# Patient Record
Sex: Female | Born: 1940
Health system: Southern US, Community
[De-identification: ages and names within clinical notes are randomized; demographics above are authoritative.]

## PROBLEM LIST (undated history)

## (undated) DIAGNOSIS — E039 Hypothyroidism, unspecified: Secondary | ICD-10-CM

## (undated) DIAGNOSIS — I493 Ventricular premature depolarization: Secondary | ICD-10-CM

## (undated) DIAGNOSIS — N183 Chronic kidney disease, stage 3 unspecified: Secondary | ICD-10-CM

## (undated) DIAGNOSIS — Z9889 Other specified postprocedural states: Secondary | ICD-10-CM

## (undated) DIAGNOSIS — I519 Heart disease, unspecified: Secondary | ICD-10-CM

## (undated) DIAGNOSIS — I480 Paroxysmal atrial fibrillation: Secondary | ICD-10-CM

## (undated) DIAGNOSIS — J309 Allergic rhinitis, unspecified: Secondary | ICD-10-CM

## (undated) DIAGNOSIS — I491 Atrial premature depolarization: Secondary | ICD-10-CM

## (undated) DIAGNOSIS — I679 Cerebrovascular disease, unspecified: Secondary | ICD-10-CM

## (undated) DIAGNOSIS — N3010 Interstitial cystitis (chronic) without hematuria: Secondary | ICD-10-CM

## (undated) DIAGNOSIS — M1990 Unspecified osteoarthritis, unspecified site: Secondary | ICD-10-CM

## (undated) DIAGNOSIS — K589 Irritable bowel syndrome without diarrhea: Secondary | ICD-10-CM

## (undated) DIAGNOSIS — I341 Nonrheumatic mitral (valve) prolapse: Secondary | ICD-10-CM

## (undated) DIAGNOSIS — D649 Anemia, unspecified: Secondary | ICD-10-CM

## (undated) DIAGNOSIS — N059 Unspecified nephritic syndrome with unspecified morphologic changes: Secondary | ICD-10-CM

## (undated) DIAGNOSIS — I340 Nonrheumatic mitral (valve) insufficiency: Secondary | ICD-10-CM

## (undated) DIAGNOSIS — E785 Hyperlipidemia, unspecified: Secondary | ICD-10-CM

## (undated) DIAGNOSIS — Z950 Presence of cardiac pacemaker: Secondary | ICD-10-CM

## (undated) DIAGNOSIS — I10 Essential (primary) hypertension: Secondary | ICD-10-CM

## (undated) DIAGNOSIS — I639 Cerebral infarction, unspecified: Secondary | ICD-10-CM

## (undated) DIAGNOSIS — I441 Atrioventricular block, second degree: Secondary | ICD-10-CM

## (undated) DIAGNOSIS — E213 Hyperparathyroidism, unspecified: Secondary | ICD-10-CM

## (undated) DIAGNOSIS — Z8679 Personal history of other diseases of the circulatory system: Secondary | ICD-10-CM

## (undated) HISTORY — DX: Nonrheumatic mitral (valve) insufficiency: I34.0

## (undated) HISTORY — PX: TONSILLECTOMY: SHX5217

## (undated) HISTORY — DX: Presence of cardiac pacemaker: Z95.0

## (undated) HISTORY — PX: BREAST BIOPSY: SHX20

## (undated) HISTORY — DX: Atrial premature depolarization: I49.1

## (undated) HISTORY — DX: Irritable bowel syndrome, unspecified: K58.9

## (undated) HISTORY — DX: Essential (primary) hypertension: I10

## (undated) HISTORY — DX: Interstitial cystitis (chronic) without hematuria: N30.10

## (undated) HISTORY — DX: Unspecified osteoarthritis, unspecified site: M19.90

## (undated) HISTORY — DX: Hypothyroidism, unspecified: E03.9

## (undated) HISTORY — DX: Unspecified nephritic syndrome with unspecified morphologic changes: N05.9

## (undated) HISTORY — DX: Heart disease, unspecified: I51.9

## (undated) HISTORY — DX: Hyperlipidemia, unspecified: E78.5

## (undated) HISTORY — DX: Paroxysmal atrial fibrillation: I48.0

## (undated) HISTORY — DX: Chronic kidney disease, stage 3 unspecified: N18.30

## (undated) HISTORY — DX: Allergic rhinitis, unspecified: J30.9

## (undated) HISTORY — DX: Cerebrovascular disease, unspecified: I67.9

## (undated) HISTORY — DX: Cerebral infarction, unspecified: I63.9

## (undated) HISTORY — DX: Nonrheumatic mitral (valve) prolapse: I34.1

## (undated) HISTORY — DX: Chronic kidney disease, stage 3 (moderate): N18.3

## (undated) HISTORY — DX: Hyperparathyroidism, unspecified: E21.3

## (undated) HISTORY — PX: CYSTOSTOMY W/ BLADDER BIOPSY: SHX1431

## (undated) HISTORY — DX: Atrioventricular block, second degree: I44.1

## (undated) HISTORY — PX: ABDOMINAL HYSTERECTOMY: SHX81

## (undated) HISTORY — DX: Anemia, unspecified: D64.9

## (undated) HISTORY — DX: Ventricular premature depolarization: I49.3

---

## 1997-12-07 ENCOUNTER — Other Ambulatory Visit: Admission: RE | Admit: 1997-12-07 | Discharge: 1997-12-07 | Payer: Self-pay | Admitting: Nephrology

## 1997-12-10 ENCOUNTER — Other Ambulatory Visit: Admission: RE | Admit: 1997-12-10 | Discharge: 1997-12-10 | Payer: Self-pay | Admitting: Nephrology

## 1998-05-03 ENCOUNTER — Ambulatory Visit (HOSPITAL_COMMUNITY): Admission: RE | Admit: 1998-05-03 | Discharge: 1998-05-03 | Payer: Self-pay | Admitting: Nephrology

## 1998-09-14 ENCOUNTER — Ambulatory Visit (HOSPITAL_COMMUNITY): Admission: RE | Admit: 1998-09-14 | Discharge: 1998-09-14 | Payer: Self-pay | Admitting: Nephrology

## 1998-12-13 ENCOUNTER — Other Ambulatory Visit: Admission: RE | Admit: 1998-12-13 | Discharge: 1998-12-13 | Payer: Self-pay | Admitting: Gynecology

## 1999-01-25 ENCOUNTER — Ambulatory Visit (HOSPITAL_COMMUNITY): Admission: RE | Admit: 1999-01-25 | Discharge: 1999-01-25 | Payer: Self-pay | Admitting: Internal Medicine

## 1999-08-25 ENCOUNTER — Ambulatory Visit (HOSPITAL_COMMUNITY): Admission: RE | Admit: 1999-08-25 | Discharge: 1999-08-25 | Payer: Self-pay | Admitting: Nephrology

## 1999-08-25 ENCOUNTER — Encounter: Payer: Self-pay | Admitting: Nephrology

## 1999-10-30 ENCOUNTER — Encounter: Payer: Self-pay | Admitting: Neurology

## 1999-10-30 ENCOUNTER — Ambulatory Visit (HOSPITAL_COMMUNITY): Admission: RE | Admit: 1999-10-30 | Discharge: 1999-10-30 | Payer: Self-pay | Admitting: Neurology

## 1999-11-29 ENCOUNTER — Encounter: Payer: Self-pay | Admitting: Obstetrics and Gynecology

## 1999-11-29 ENCOUNTER — Encounter: Admission: RE | Admit: 1999-11-29 | Discharge: 1999-11-29 | Payer: Self-pay | Admitting: Obstetrics and Gynecology

## 1999-12-07 ENCOUNTER — Other Ambulatory Visit: Admission: RE | Admit: 1999-12-07 | Discharge: 1999-12-07 | Payer: Self-pay | Admitting: Obstetrics and Gynecology

## 2000-05-27 ENCOUNTER — Encounter: Payer: Self-pay | Admitting: Neurology

## 2000-05-27 ENCOUNTER — Ambulatory Visit (HOSPITAL_COMMUNITY): Admission: RE | Admit: 2000-05-27 | Discharge: 2000-05-27 | Payer: Self-pay | Admitting: Neurology

## 2000-09-02 ENCOUNTER — Encounter: Admission: RE | Admit: 2000-09-02 | Discharge: 2000-09-02 | Payer: Self-pay | Admitting: Obstetrics and Gynecology

## 2000-09-02 ENCOUNTER — Encounter: Payer: Self-pay | Admitting: Obstetrics and Gynecology

## 2000-12-04 ENCOUNTER — Encounter: Admission: RE | Admit: 2000-12-04 | Discharge: 2000-12-04 | Payer: Self-pay | Admitting: Obstetrics and Gynecology

## 2000-12-04 ENCOUNTER — Encounter: Payer: Self-pay | Admitting: Obstetrics and Gynecology

## 2000-12-10 ENCOUNTER — Encounter: Admission: RE | Admit: 2000-12-10 | Discharge: 2000-12-10 | Payer: Self-pay | Admitting: Obstetrics and Gynecology

## 2000-12-10 ENCOUNTER — Encounter: Payer: Self-pay | Admitting: Obstetrics and Gynecology

## 2000-12-12 ENCOUNTER — Other Ambulatory Visit: Admission: RE | Admit: 2000-12-12 | Discharge: 2000-12-12 | Payer: Self-pay | Admitting: Internal Medicine

## 2001-12-22 ENCOUNTER — Encounter: Admission: RE | Admit: 2001-12-22 | Discharge: 2001-12-22 | Payer: Self-pay | Admitting: Obstetrics and Gynecology

## 2001-12-22 ENCOUNTER — Encounter: Payer: Self-pay | Admitting: Obstetrics and Gynecology

## 2001-12-23 ENCOUNTER — Other Ambulatory Visit: Admission: RE | Admit: 2001-12-23 | Discharge: 2001-12-23 | Payer: Self-pay | Admitting: Obstetrics and Gynecology

## 2002-12-25 ENCOUNTER — Encounter: Payer: Self-pay | Admitting: Obstetrics and Gynecology

## 2002-12-25 ENCOUNTER — Encounter: Admission: RE | Admit: 2002-12-25 | Discharge: 2002-12-25 | Payer: Self-pay | Admitting: Obstetrics and Gynecology

## 2003-12-28 ENCOUNTER — Encounter: Admission: RE | Admit: 2003-12-28 | Discharge: 2003-12-28 | Payer: Self-pay | Admitting: Obstetrics and Gynecology

## 2004-07-12 ENCOUNTER — Ambulatory Visit: Payer: Self-pay | Admitting: Internal Medicine

## 2004-08-29 ENCOUNTER — Encounter (INDEPENDENT_AMBULATORY_CARE_PROVIDER_SITE_OTHER): Payer: Self-pay | Admitting: *Deleted

## 2004-08-29 ENCOUNTER — Ambulatory Visit: Payer: Self-pay | Admitting: Internal Medicine

## 2004-08-29 ENCOUNTER — Ambulatory Visit (HOSPITAL_COMMUNITY): Admission: RE | Admit: 2004-08-29 | Discharge: 2004-08-29 | Payer: Self-pay | Admitting: Internal Medicine

## 2005-01-15 ENCOUNTER — Encounter: Admission: RE | Admit: 2005-01-15 | Discharge: 2005-01-15 | Payer: Self-pay | Admitting: Obstetrics and Gynecology

## 2006-02-11 ENCOUNTER — Encounter: Admission: RE | Admit: 2006-02-11 | Discharge: 2006-02-11 | Payer: Self-pay | Admitting: Obstetrics and Gynecology

## 2006-02-20 ENCOUNTER — Encounter: Admission: RE | Admit: 2006-02-20 | Discharge: 2006-02-20 | Payer: Self-pay | Admitting: Obstetrics and Gynecology

## 2006-06-11 ENCOUNTER — Ambulatory Visit: Payer: Self-pay | Admitting: Internal Medicine

## 2007-03-18 ENCOUNTER — Encounter: Admission: RE | Admit: 2007-03-18 | Discharge: 2007-03-18 | Payer: Self-pay | Admitting: Obstetrics and Gynecology

## 2007-03-24 ENCOUNTER — Encounter: Admission: RE | Admit: 2007-03-24 | Discharge: 2007-03-24 | Payer: Self-pay | Admitting: Obstetrics and Gynecology

## 2008-03-24 ENCOUNTER — Encounter: Admission: RE | Admit: 2008-03-24 | Discharge: 2008-03-24 | Payer: Self-pay | Admitting: Obstetrics and Gynecology

## 2009-04-14 ENCOUNTER — Encounter: Admission: RE | Admit: 2009-04-14 | Discharge: 2009-04-14 | Payer: Self-pay | Admitting: Obstetrics and Gynecology

## 2009-08-31 ENCOUNTER — Encounter (INDEPENDENT_AMBULATORY_CARE_PROVIDER_SITE_OTHER): Payer: Self-pay | Admitting: *Deleted

## 2009-10-04 ENCOUNTER — Telehealth: Payer: Self-pay | Admitting: Internal Medicine

## 2009-10-05 DIAGNOSIS — Z862 Personal history of diseases of the blood and blood-forming organs and certain disorders involving the immune mechanism: Secondary | ICD-10-CM | POA: Insufficient documentation

## 2009-10-05 DIAGNOSIS — Z8639 Personal history of other endocrine, nutritional and metabolic disease: Secondary | ICD-10-CM

## 2009-10-05 DIAGNOSIS — R1310 Dysphagia, unspecified: Secondary | ICD-10-CM | POA: Insufficient documentation

## 2009-10-05 DIAGNOSIS — I10 Essential (primary) hypertension: Secondary | ICD-10-CM | POA: Insufficient documentation

## 2009-10-06 ENCOUNTER — Ambulatory Visit: Payer: Self-pay | Admitting: Internal Medicine

## 2009-10-06 DIAGNOSIS — K5289 Other specified noninfective gastroenteritis and colitis: Secondary | ICD-10-CM | POA: Insufficient documentation

## 2009-10-25 ENCOUNTER — Ambulatory Visit: Payer: Self-pay | Admitting: Internal Medicine

## 2009-10-29 ENCOUNTER — Encounter: Payer: Self-pay | Admitting: Internal Medicine

## 2009-11-22 ENCOUNTER — Telehealth: Payer: Self-pay | Admitting: Internal Medicine

## 2010-01-02 ENCOUNTER — Encounter (INDEPENDENT_AMBULATORY_CARE_PROVIDER_SITE_OTHER): Payer: Self-pay | Admitting: *Deleted

## 2010-01-04 ENCOUNTER — Encounter (INDEPENDENT_AMBULATORY_CARE_PROVIDER_SITE_OTHER): Payer: Self-pay | Admitting: *Deleted

## 2010-01-27 ENCOUNTER — Encounter: Admission: RE | Admit: 2010-01-27 | Discharge: 2010-01-27 | Payer: Self-pay | Admitting: Family Medicine

## 2010-01-27 ENCOUNTER — Encounter (INDEPENDENT_AMBULATORY_CARE_PROVIDER_SITE_OTHER): Payer: Self-pay | Admitting: *Deleted

## 2010-02-16 ENCOUNTER — Encounter: Payer: Self-pay | Admitting: Internal Medicine

## 2010-03-27 ENCOUNTER — Ambulatory Visit: Payer: Self-pay | Admitting: Internal Medicine

## 2010-03-27 DIAGNOSIS — N059 Unspecified nephritic syndrome with unspecified morphologic changes: Secondary | ICD-10-CM | POA: Insufficient documentation

## 2010-03-27 LAB — CONVERTED CEMR LAB: IgG (Immunoglobin G), Serum: 1218 mg/dL (ref 694–1618)

## 2010-05-01 ENCOUNTER — Encounter: Admission: RE | Admit: 2010-05-01 | Discharge: 2010-05-01 | Payer: Self-pay | Admitting: Obstetrics and Gynecology

## 2010-06-08 ENCOUNTER — Ambulatory Visit: Payer: Self-pay | Admitting: Internal Medicine

## 2010-06-13 ENCOUNTER — Telehealth: Payer: Self-pay | Admitting: Internal Medicine

## 2010-06-28 ENCOUNTER — Telehealth (INDEPENDENT_AMBULATORY_CARE_PROVIDER_SITE_OTHER): Payer: Self-pay | Admitting: *Deleted

## 2010-06-29 ENCOUNTER — Ambulatory Visit: Payer: Self-pay | Admitting: Internal Medicine

## 2010-06-30 ENCOUNTER — Encounter: Payer: Self-pay | Admitting: Internal Medicine

## 2010-07-11 ENCOUNTER — Telehealth (INDEPENDENT_AMBULATORY_CARE_PROVIDER_SITE_OTHER): Payer: Self-pay | Admitting: *Deleted

## 2010-07-12 ENCOUNTER — Encounter: Payer: Self-pay | Admitting: Internal Medicine

## 2010-07-24 ENCOUNTER — Encounter: Payer: Self-pay | Admitting: Internal Medicine

## 2010-07-24 ENCOUNTER — Ambulatory Visit
Admission: RE | Admit: 2010-07-24 | Discharge: 2010-07-24 | Payer: Self-pay | Source: Home / Self Care | Attending: Internal Medicine | Admitting: Internal Medicine

## 2010-07-24 ENCOUNTER — Other Ambulatory Visit: Payer: Self-pay | Admitting: Internal Medicine

## 2010-07-24 DIAGNOSIS — R197 Diarrhea, unspecified: Secondary | ICD-10-CM | POA: Insufficient documentation

## 2010-07-24 LAB — TSH: TSH: 0.97 u[IU]/mL (ref 0.35–5.50)

## 2010-07-24 LAB — IGA: IgA: 134 mg/dL (ref 68–378)

## 2010-07-24 LAB — SEDIMENTATION RATE: Sed Rate: 17 mm/hr (ref 0–22)

## 2010-07-25 LAB — CONVERTED CEMR LAB: Tissue Transglutaminase Ab, IgA: 18.6 units (ref <20)

## 2010-07-30 ENCOUNTER — Encounter: Payer: Self-pay | Admitting: Obstetrics and Gynecology

## 2010-08-08 NOTE — Assessment & Plan Note (Signed)
Summary: Elevated liver functions   History of Present Illness Visit Type: Follow-up Consult Primary GI MD: Lina Sar MD Primary Provider: [name], MD Requesting Provider: [name], MD Chief Complaint: Patient here to discuss elevated liver function. She has decreased her Prilosec to once a day due to the dry mouth and feeling like she has "fur" on her tounge. She is now having diarrhea which has not last 5-6 weeks, but it is getting better now.  History of Present Illness:   This is a 70 year old white female with resolving diarrhea. She has a history of microscopic colitis in 2000 based on colonoscopic biopsies. Her most recent colonoscopy in April 2011 showed normal colonic mucosa. She developed abnormal liver function test initially attributed to statins. Her upper abdominal ultrasound showed a normal liver and spleen but her alkaline phosphatase and GGT was elevated at 297 and 271 respectively. Her anti-mitochondrial antibody was elevated at 42.3. After discontinuing her statins, her liver function tests returned to normal. Her AMA titer has not been repeated. She has a history of hypothyroidism. She also complains of dry mouth and dry eyes. She has never been tested for Sjogren syndrome.   GI Review of Systems      Denies abdominal pain, acid reflux, belching, bloating, chest pain, dysphagia with liquids, dysphagia with solids, heartburn, loss of appetite, nausea, vomiting, vomiting blood, weight loss, and  weight gain.      Reports diarrhea.     Denies anal fissure, black tarry stools, change in bowel habit, constipation, diverticulosis, fecal incontinence, heme positive stool, hemorrhoids, irritable bowel syndrome, jaundice, light color stool, liver problems, rectal bleeding, and  rectal pain.    Current Medications (verified): 1)  Synthroid 75 Mcg Tabs (Levothyroxine Sodium) .... One Tablet By Mouth Once Daily 2)  Pravachol 20 Mg Tabs (Pravastatin Sodium) .... Take One By  Mouth Once Daily 3)  Calcitriol 0.25 Mcg Caps (Calcitriol) .... One Tablet By Mouth Once Daily 4)  Flonase 50 Mcg/act Susp (Fluticasone Propionate) .... Once Daily 5)  Acidophilus Xtra  Tabs (Probiotic Product) .... One Tablet By Mouth Two Times A Day 6)  Amiloride-Hydrochlorothiazide 5-50 Mg Tabs (Amiloride-Hydrochlorothiazide) .... One Tablet By Mouth Once Daily 7)  Claritin 10 Mg Tabs (Loratadine) .... One Tablet By Mouth Once Daily 8)  Aspirin 325 Mg  Tabs (Aspirin) .... One Tablet By Mouth Once Daily 9)  Multivitamins   Tabs (Multiple Vitamin) .... One Tablet By Mouth Once Daily 10)  Fish Oil 1000 Mg Caps (Omega-3 Fatty Acids) .... 2 Tablets By Mouth Once Daily 11)  Glucosamine 500 Mg Caps (Glucosamine Sulfate) .... 2 Capsules By Mouth Once Daily 12)  Lutein 6 Mg Caps (Lutein) .... One Tablet By Mouth Once Daily 13)  Metrogel 1 % Gel (Metronidazole) .... Externally Once Daily 14)  Westcort 0.2 % Oint (Hydrocortisone Valerate) .... 2 Tablets By Mouth Once Daily As Needed 15)  Prilosec 20 Mg Cpdr (Omeprazole) .... Take 1 Tablet By Mouth Once Daily 16)  Amoxicillin 500 Mg Caps (Amoxicillin) .... Take Two By Mouth Two Times A Day  Allergies (verified): 1)  ! Tetracycline 2)  ! Sulfa 3)  ! Keflex 4)  ! Erythromycin 5)  ! Cipro  Past History:  Past Medical History: Reviewed history from 10/06/2009 and no changes required. Current Problems:  HYPOKALEMIA, HX OF (ICD-V12.2) HYPERTENSION (ICD-401.9) DYSPHAGIA UNSPECIFIED (ICD-787.20)  Hyperlipidemia Hypothyroidism Interstitial Cystitis Irritable Bowel Syndrome  Past Surgical History: Reviewed history from 10/05/2009 and no changes required. Hysterectomy Breast  biopsy  Family History: Reviewed history from 10/05/2009 and no changes required. Family History of Ovarian Cancer:Sister No FH of Colon Cancer: Family History of Heart Disease: Mother, Brother, Father  Social History: Reviewed history from 10/06/2009 and no changes  required. Mother Retired Alcohol Use - yes-occasional Illicit Drug Use - no Patient is a former smoker.   Review of Systems       The patient complains of allergy/sinus and fatigue.  The patient denies anemia, anxiety-new, arthritis/joint pain, back pain, blood in urine, breast changes/lumps, change in vision, confusion, cough, coughing up blood, depression-new, fainting, fever, headaches-new, hearing problems, heart murmur, heart rhythm changes, itching, menstrual pain, muscle pains/cramps, night sweats, nosebleeds, pregnancy symptoms, shortness of breath, skin rash, sleeping problems, sore throat, swelling of feet/legs, swollen lymph glands, thirst - excessive , urination - excessive , urination changes/pain, urine leakage, vision changes, and voice change.         Pertinent positive and negative review of systems were noted in the above HPI. All other ROS was otherwise negative.   Vital Signs:  Patient profile:   70 year old female Height:      67.5 inches Weight:      143.8 pounds BMI:     22.27 Pulse rate:   82 / minute Pulse rhythm:   regular BP sitting:   144 / 62  (left arm) Cuff size:   regular  Vitals Entered By: Harlow Mares CMA Duncan Dull) (March 27, 2010 1:38 PM)  Physical Exam  General:  Well developed, well nourished, no acute distress. Eyes:  nonicteric. Mouth:  normal oral mucosa. Neck:  Supple; no masses or thyromegaly. Lungs:  Clear throughout to auscultation. Heart:  Regular rate and rhythm; no murmurs, rubs,  or bruits. Abdomen:  Soft, nontender and nondistended. No masses, hepatosplenomegaly or hernias noted. Normal bowel sounds. Extremities:  No clubbing, cyanosis, edema or deformities noted. Skin:  Intact without significant lesions or rashes. Psych:  Alert and cooperative. Normal mood and affect.   Impression & Recommendations:  Problem # 1:  LIVER FUNCTION TESTS, ABNORMAL, HX OF (ICD-V12.2) Patient has an elevated antimitochondrial antibody  suggestive of primary biliary cirrhosis syndrome. We will repeat her IgM levels and AMA titer again. Her ultrasound did not show any evidence of hepatosplenomegaly or portal hypertension. Orders: T-AMA ([id number]) T-Anti SMA ([id number]) TLB-IgG (Immunoglobulin G) (82784-IGG) TLB-IgM (Immunoglobulin M) (82784-IGM)  Problem # 2:  COLITIS (ICD-558.9) Patient has a history of microscopic colitis in 2000, not reproduced on biopsies from 2011. Orders: T-AMA [id number]) T-Anti SMA ([id number]) TLB-IgG (Immunoglobulin G) (82784-IGG) TLB-IgM (Immunoglobulin M) (82784-IGM)  Problem # 3:  DYSPHAGIA UNSPECIFIED (ICD-787.20) Patient is status post upper endoscopy in April 2011 with dilatation. Her symptoms are controlled on Prilosec.  Problem # 4:  GLOMERULONEPHRITIS (ICD-583.9) followed by Dr [name].  Patient Instructions: 1)  anti-mitochondrial antibody, IgM and IgG levels. 2)  If abnormal, consider percutaneous liver biopsy. 3)  Rule out kerratoconjunstivitis and Xerostomia. You should check with your ophthalmologist (Schirmenr's test). 4)  Copy sent to : Dr Hart Rochester, Dr Deterding 5)  The medication list was reviewed and reconciled.  All changed / newly prescribed medications were explained.  A complete medication list was provided to the patient / caregiver.

## 2010-08-08 NOTE — Progress Notes (Signed)
Summary: triage-Dysphagia  Phone Note Call from Patient Call back at Home Phone [phone]   Caller: Patient Call For: Juanda Chance Reason for Call: Talk to Nurse Summary of Call: Patient would like to schedule an appt because she feels food getting stuck in her chest especially if she eats meat.Patient states that she has pain when she eats. Initial call taken by: Tawni Levy,  October 04, 2009 2:56 PM  Follow-up for Phone Call        Pt. c/o episodes of dysphagia, getting worse and more frequent.   1) Begin Prilosec OTC two times a day 2) Soft,bland diet. Be very careful with meats,rice and breads. 3) See Dr.Morgon Pamer on 10-06-09 at 5pm 4) If symptoms become worse call back immediately or go to ER.  Follow-up by: Laureen Ochs LPN,  October 04, 2009 3:23 PM

## 2010-08-08 NOTE — Progress Notes (Signed)
Summary: How long should she stay on Prilosec?  Phone Note Call from Patient Call back at Home Phone [phone]   Call For: DR Waynette Towers Reason for Call: Talk to Nurse Summary of Call: Had Endo 10-25-09 is doing just fine. Unsure how long is she to stay on Prilosec for? Initial call taken by: Leanor Kail [facility],  Nov 22, 2009 10:35 AM  Follow-up for Phone Call        Patient has been advised to stay on prilosec indefinately and to follow antireflux measures. Patient verbalizes understanding. Follow-up by: Lamona Curl CMA Duncan Dull),  Nov 22, 2009 1:40 PM

## 2010-08-08 NOTE — Procedures (Signed)
Summary: COLON   Colonoscopy  Procedure date:  08/29/2004  Findings:      Location:  [facility].    Procedures Next Due Date:    Colonoscopy: 09/2009 Patient Name: Amanda [name], Amanda [name] a. MRN:  Procedure Procedures: Colonoscopy CPT: [phone].    with biopsy. CPT: [id number].  Personnel: Endoscopist: Aaralyn Kil L. Juanda Chance, MD.  Exam Location: Exam performed in Outpatient Clinic. Outpatient  Patient Consent: Procedure, Alternatives, Risks and Benefits discussed, consent obtained, from patient. Consent was obtained by the RN.  Indications  Surveillance of: hx of microscopic colitis. 2000.  History  Current Medications: Patient is not currently taking Coumadin.  Pre-Exam Physical: Performed Aug 29, 2004. Entire physical exam was normal.  Exam Exam: Extent of exam reached: Cecum, extent intended: Cecum.  The cecum was identified by appendiceal orifice and IC valve. Colon retroflexion performed. Images taken. ASA Classification: I. Tolerance: good.  Monitoring: Pulse and BP monitoring, Oximetry used. Supplemental O2 given.  Colon Prep Used Miralax for colon prep. Prep results: good.  Sedation Meds: Patient assessed and found to be appropriate for moderate (conscious) sedation. Fentanyl 75 mcg. given IV. Versed 7 mg. given IV.  Findings DIAGNOSTIC TEST: Biopsies taken. from Ascending Colon. Reason: hx of colitis.   Assessment Normal examination.  Comments: s/p random biopsies Events  Unplanned Interventions: No intervention was required.  Unplanned Events: There were no complications. Plans Medication Plan: Await pathology.  Patient Education: Patient given standard instructions for: Yearly hemoccult testing recommended. Patient instructed to get routine colonoscopy every 5-7 years.  Comments: final disposition pending results of random biopsies Disposition: After procedure patient sent to recovery. After recovery patient sent home.   This report was  created from the original endoscopy report, which was reviewed and signed by the above listed endoscopist.    FINAL DIAGNOSIS    ***MICROSCOPIC EXAMINATION AND DIAGNOSIS***    COLON, BIOPSIES: BENIGN COLONIC MUCOSA WITH UNDERLYING LYMPHOID   AGGREGATES.   SEE COMMENT.    COMMENT   The clinical history of microscopic colitis is noted ([id number]).   The current biopsy specimen demonstrates fragments of benign   colonic mucosa with underlying lymphoid aggregates. No active   colitis is seen. No evidence of microscopic colitis is noted.   No malignancy is identified. (MS:jy) 08/30/04    jy   Date Reported: 08/30/2004 Renato Battles, M.D.   *** Electronically Signed Out By MS ***    Clinical information   History of microscopic colitis; now asymptomatic (cm)    specimen(s) obtained   Colon, biopsy, random    Gross Description   Received in formalin are tan, soft tissue fragments that are   submitted in toto. Number: multiple   Size: 0.5 x 0.4 x 0.2cm, one block. (SW:gt) 08/29/04    gdt/

## 2010-08-08 NOTE — Procedures (Signed)
Summary: Upper Endoscopy  Patient: Amanda [name] Note: All result statuses are Final unless otherwise noted.  Tests: (1) Upper Endoscopy (EGD)   EGD Upper Endoscopy       DONE     [facility]     [address]     [place], [place]  [address]           ENDOSCOPY PROCEDURE REPORT           PATIENT:  Amanda [name], Amanda [name]  MR#:  [id number]     BIRTHDATE:  [birth year], 68 yrs. old  GENDER:  female           ENDOSCOPIST:  Hedwig Morton. Juanda Chance, MD     Referred by:  [name], M.D.           PROCEDURE DATE:  10/25/2009     PROCEDURE:  EGD with dilatation over guidewire     ASA CLASS:  Class I     INDICATIONS:  dysphagia, cough dysphagia to solids, food     regurgitation           MEDICATIONS:   Versed 1 mg, Fentanyl 25 mcg, There was residual     sedation effect present from prior procedure.     TOPICAL ANESTHETIC:  Cetacaine Spray           DESCRIPTION OF PROCEDURE:   After the risks benefits and     alternatives of the procedure were thoroughly explained, informed     consent was obtained.  The  endoscope was introduced through the     mouth and advanced to the second portion of the duodenum, without     limitations.  The instrument was slowly withdrawn as the mucosa     was fully examined.     <<PROCEDUREIMAGES>>           A stricture was found in the distal esophagus (see image1, image5,     and image6). benign appearing fibrous stricture at 36 cm, about 14     mm, no acute esophGITIS Savary dilation over a guidewire 1,15,16MM     DILATORS PASSED WITHOUT DIFFICULTI  other findings (see image4,     image3, and image2).  A hiatal hernia was found. 1-2 cm sliding     hiatal hernia    Retroflexed views revealed no abnormalities.     The scope was then withdrawn from the patient and the procedure     completed.           COMPLICATIONS:  None           ENDOSCOPIC IMPRESSION:     1) Stricture in the distal esophagus     2) Other findings     3) Hiatal hernia     s/p  Savary dil to 16 mm     RECOMMENDATIONS:     1) Anti-reflux regimen to be follow     Prelosec 20 mg po qd           REPEAT EXAM:  In 0 year(s) for.  redilate prn           ______________________________     Hedwig Morton. Juanda Chance, MD           CC:           n.     eSIGNED:   Hedwig Morton. Demontae Antunes at 10/25/2009 02:56 PM           Carrie Mew, [id number]  Note: An exclamation mark (!) indicates  a result that was not dispersed into the flowsheet. Document Creation Date: 10/25/2009 2:57 PM _______________________________________________________________________  (1) Order result status: Final Collection or observation date-time: 10/25/2009 14:46 Requested date-time:  Receipt date-time:  Reported date-time:  Referring Physician:   Ordering Physician: Lina Sar [phone]) Specimen Source:  Source: Launa Grill Order Number: [id number] Lab site:

## 2010-08-08 NOTE — Assessment & Plan Note (Signed)
Summary: WORSENING DYSPHAGIA              DEBORAH   History of Present Illness Visit Type: Follow-up Visit Primary GI MD: Lina Sar MD Primary Provider: [name], MD Chief Complaint: Solid food dysphagia with coughing, increasing acid reflux.Pt states she eats certain meats that gets stuck in her esophagus and she clears her throat a lot. History of Present Illness:   This is a 70 year old white female with a several month history of solid food dysphagia. She also complains of occasional cough with dry throat and mucous in the back of her throat. She has noticed difficulty with some meats but no problems with liquids. We have followed her in the past for microscopic colitis which was found on a colonoscopy completed in 2000. A repeat colonoscopy in February 2006 showed no colitis. She is due for a repeat colonoscopy this year. She was on antacids and calcium supplementation but discontinued those one year ago. She has been followed by nephrology for glomerulonephritis.   GI Review of Systems    Reports abdominal pain, acid reflux, dysphagia with solids, and  heartburn.     Location of  Abdominal pain: epigastric area.    Denies belching, bloating, chest pain, dysphagia with liquids, loss of appetite, nausea, vomiting, vomiting blood, weight loss, and  weight gain.        Denies anal fissure, black tarry stools, change in bowel habit, constipation, diarrhea, diverticulosis, fecal incontinence, heme positive stool, hemorrhoids, irritable bowel syndrome, jaundice, light color stool, liver problems, rectal bleeding, and  rectal pain. Preventive Screening-Counseling & Management  Alcohol-Tobacco     Smoking Status: quit    Current Medications (verified): 1)  Synthroid 75 Mcg Tabs (Levothyroxine Sodium) .... One Tablet By Mouth Once Daily 2)  Lipitor 10 Mg Tabs (Atorvastatin Calcium) .... One Tablet By Mouth Once Daily 3)  Calcitriol 0.25 Mcg Caps (Calcitriol) .... One Tablet By Mouth  Once Daily 4)  Flonase 50 Mcg/act Susp (Fluticasone Propionate) .... Once Daily 5)  Acidophilus Xtra  Tabs (Probiotic Product) .... One Tablet By Mouth Two Times A Day 6)  Amiloride-Hydrochlorothiazide 5-50 Mg Tabs (Amiloride-Hydrochlorothiazide) .... One Tablet By Mouth Once Daily 7)  Claritin 10 Mg Tabs (Loratadine) .... One Tablet By Mouth Once Daily 8)  Aspirin 325 Mg  Tabs (Aspirin) .... One Tablet By Mouth Once Daily 9)  Multivitamins   Tabs (Multiple Vitamin) .... One Tablet By Mouth Once Daily 10)  Fish Oil 1000 Mg Caps (Omega-3 Fatty Acids) .... 2 Tablets By Mouth Once Daily 11)  Glucosamine 500 Mg Caps (Glucosamine Sulfate) .... 2 Capsules By Mouth Once Daily 12)  Lutein 6 Mg Caps (Lutein) .... One Tablet By Mouth Once Daily 13)  Metrogel 1 % Gel (Metronidazole) .... Externally Once Daily 14)  Westcort 0.2 % Oint (Hydrocortisone Valerate) .... 2 Tablets By Mouth Once Daily As Needed  Allergies: 1)  ! Tetracycline 2)  ! Sulfa 3)  ! Keflex 4)  ! Erythromycin 5)  ! Cipro  Past History:  Past Medical History: Current Problems:  HYPOKALEMIA, HX OF (ICD-V12.2) HYPERTENSION (ICD-401.9) DYSPHAGIA UNSPECIFIED (ICD-787.20)  Hyperlipidemia Hypothyroidism Interstitial Cystitis Irritable Bowel Syndrome  Past Surgical History: Reviewed history from 10/05/2009 and no changes required. Hysterectomy Breast biopsy  Family History: Reviewed history from 10/05/2009 and no changes required. Family History of Ovarian Cancer:Sister No FH of Colon Cancer: Family History of Heart Disease: Mother, Brother, Father  Social History: Reviewed history from 10/05/2009 and no changes required. Mother Retired  Alcohol Use - yes-occasional Illicit Drug Use - no Patient is a former smoker.  Smoking Status:  quit  Review of Systems       The patient complains of allergy/sinus, blood in urine, cough, fatigue, itching, and sore throat.  The patient denies anemia, anxiety-new, arthritis/joint  pain, back pain, breast changes/lumps, change in vision, confusion, coughing up blood, depression-new, fainting, fever, headaches-new, hearing problems, heart murmur, heart rhythm changes, menstrual pain, muscle pains/cramps, night sweats, nosebleeds, pregnancy symptoms, shortness of breath, skin rash, sleeping problems, swelling of feet/legs, swollen lymph glands, thirst - excessive , urination - excessive , urination changes/pain, urine leakage, vision changes, and voice change.         Pertinent positive and negative review of systems were noted in the above HPI. All other ROS was otherwise negative.   Vital Signs:  Patient profile:   70 year old female Height:      67.5 inches Weight:      145 pounds BMI:     22.46 Pulse rate:   80 / minute Pulse rhythm:   regular BP sitting:   148 / 72  (right arm) Cuff size:   regular  Vitals Entered By: Christie Nottingham CMA Duncan Dull) (October 06, 2009 4:25 PM)   Impression & Recommendations:  Problem # 1:  DYSPHAGIA UNSPECIFIED (ICD-787.20)  Patient has progressive dysphagia to solids and liquids, sore throat and scratching which may be related to  allergies o postnasal drip. Solid food dysphagia is suggestive of an esophageal stricture. She has a history of intermittent acid reflux but has never been evaluated for it. We will proceed with an upper endoscopy at this time. She will continue on Prilosec 20 mg twice a day.  Orders: Colon/Endo (Colon/Endo)  Problem # 2:  COLITIS (ICD-558.9)  Patient has a history of microscopic colitis seen on a colonoscopy in 2000 while she was having diarrhea. She had a normal colonoscopy with random biopsies in 2006. She is due for a repeat colonoscopy at this time. She denies any diarrhea. Her diet includes a probiotic one twice a day.  Orders: Colon/Endo (Colon/Endo)  Patient Instructions: 1)  Prilosec 20 mg p.o. b.i.d. 2)  Antireflux measures. 3)  Upper endoscopy with possible dilatation. 4)  Colonoscopy. 5)   Copy sent to :Dr Hall Busing  Prescriptions: PRILOSEC 20 MG CPDR (OMEPRAZOLE) Take 1 tablet by mouth two times a day  #60 x 3   Entered by:   Hortense Ramal CMA (AAMA)   Authorized by:   Hart Carwin MD   Signed by:   Hortense Ramal CMA (AAMA) on 10/06/2009   Method used:   Electronically to        [facility] (retail)       [address]       [place], [place]  [id number]       Ph: [phone] or [phone]       Fax: [phone]   RxID:   [id number] DULCOLAX 5 MG  TBEC (BISACODYL) Day before procedure take 2 at 3pm and 2 at 8pm.  #4 x 0   Entered by:   Hortense Ramal CMA (AAMA)   Authorized by:   Hart Carwin MD   Signed by:   Hortense Ramal CMA (AAMA) on 10/06/2009   Method used:   Electronically to        [facility] (retail)       [address]       [place], [place]  [id number]       Ph: [phone] or [phone]       Fax: [phone]   RxID:   [id number] REGLAN 10 MG  TABS (METOCLOPRAMIDE HCL) As per prep instructions.  #2 x 0   Entered by:   Hortense Ramal CMA (AAMA)   Authorized by:   Hart Carwin MD   Signed by:   Hortense Ramal CMA (AAMA) on 10/06/2009   Method used:   Electronically to        [facility] (retail)       [address]       [place], [place]  [id number]       Ph: [phone] or [phone]       Fax: [phone]   RxID:   [id number] MIRALAX   POWD (POLYETHYLENE GLYCOL 3350) As per prep  instructions.  #255gm x 0   Entered by:   Hortense Ramal CMA (AAMA)   Authorized by:   Hart Carwin MD   Signed by:   Hortense Ramal CMA (AAMA) on 10/06/2009   Method used:   Electronically to        [facility] (retail)       [address]       [place], [place]  [id number]       Ph: [phone] or [phone]       Fax: [phone]   RxID:   [id number]

## 2010-08-08 NOTE — Letter (Signed)
Summary: Patient Notice- Colon Biospy Results  [facility]  [address] [place], [place] [address]   Phone: [phone]  Fax: [phone]        October 29, 2009 MRN: [id number]    [facility] [address] [place], [place]  [address]    Dear Ms. Gahan,  I am pleased to inform you that the biopsies taken during your recent  colonoscopy did not show any evidence of colitis. Additional information/recommendations:  _x_No further action is needed at this time.  Please follow-up with      your primary care physician for your other healthcare needs.  __Please call [phone] to schedule a return visit to review      your condition.  __Continue with the treatment plan as outlined on the day of your      exam.  _x_You should have a repeat colonoscopy examination for this problem           in  10 _ years.  Please call us if you are having persistent problems or have questions about your condition that have not been fully answered at this time.  Sincerely,  Hart Carwin MD   This letter has been electronically signed by your physician.  Appended Document: Patient Notice- Colon Biospy Results letter mailed 4.25.11

## 2010-08-08 NOTE — Letter (Signed)
Summary: [facility] Instructions  [facility]  [address] [place], [place] [address]   Phone: [phone]  Fax: [phone]       AMERI CAHOON    04-Nov-1940    MRN: [id number]       Procedure Day /Date: 10/25/09 Tuesday     Arrival Time: 1:00 pm     Procedure Time: 2:00 pm     Location of Procedure:                    _x _  [facility] (4th Floor)  PREPARATION FOR COLONOSCOPY WITH MIRALAX  Starting 5 days prior to your procedure (10/20/09) do not eat nuts, seeds, popcorn, corn, beans, peas,  salads, or any raw vegetables.  Do not take any fiber supplements (e.g. Metamucil, Citrucel, and Benefiber). ____________________________________________________________________________________________________   THE DAY BEFORE YOUR PROCEDURE         DATE: 10/24/09 DAY: Monday  1   Drink clear liquids the entire day-NO SOLID FOOD  2   Do not drink anything colored red or purple.  Avoid juices with pulp.  No orange juice.  3   Drink at least 64 oz. (8 glasses) of fluid/clear liquids during the day to prevent dehydration and help the prep work efficiently.  CLEAR LIQUIDS INCLUDE: Water Jello Ice Popsicles Tea (sugar ok, no milk/cream) Powdered fruit flavored drinks Coffee (sugar ok, no milk/cream) Gatorade Juice: apple, white grape, white cranberry  Lemonade Clear bullion, consomm, broth Carbonated beverages (any kind) Strained chicken noodle soup Hard Candy  4   Mix the entire bottle of Miralax with 64 oz. of Gatorade/Powerade in the morning and put in the refrigerator to chill.  5   At 3:00 pm take 2 Dulcolax/Bisacodyl tablets.  6   At 4:30 pm take one Reglan/Metoclopramide tablet.  7  Starting at 5:00 pm drink one 8 oz glass of the Miralax mixture every 15-20 minutes until you have finished drinking the entire 64 oz.  You should finish drinking prep around 7:30 or 8:00 pm.  8   If you are nauseated, you may take the 2nd Reglan/Metoclopramide  tablet at 6:30 pm.        9    At 8:00 pm take 2 more DULCOLAX/Bisacodyl tablets.         THE DAY OF YOUR PROCEDURE      DATE:  10/25/09 DAY: Tuesday  You may drink clear liquids until 12:00 pm (2 HOURS BEFORE PROCEDURE).   MEDICATION INSTRUCTIONS  Unless otherwise instructed, you should take regular prescription medications with a small sip of water as early as possible the morning of your procedure.         OTHER INSTRUCTIONS  You will need a responsible adult at least 72 years of age to accompany you and drive you home.   This person must remain in the waiting room during your procedure.  Wear loose fitting clothing that is easily removed.  Leave jewelry and other valuables at home.  However, you may wish to bring a book to read or an iPod/MP3 player to listen to music as you wait for your procedure to start.  Remove all body piercing jewelry and leave at home.  Total time from sign-in until discharge is approximately 2-3 hours.  You should go home directly after your procedure and rest.  You can resume normal activities the day after your procedure.  The day of your procedure you should not:   Drive  Make legal decisions   Operate machinery   Drink alcohol   Return to work  You will receive specific instructions about eating, activities and medications before you leave.   The above instructions have been reviewed and explained to me by  Hortense Ramal CMA Duncan Dull)  October 06, 2009 5:19 PM     I fully understand and can verbalize these instructions _____________________________ Date 10/06/09

## 2010-08-08 NOTE — Letter (Signed)
Summary: [employer]  [employer]   Imported By: Lamona Curl CMA (AAMA) 03/22/2010 15:56:13  _____________________________________________________________________  External Attachment:    Type:   Image     Comment:   External Document

## 2010-08-08 NOTE — Procedures (Signed)
Summary: Colonoscopy  Patient: Amanda [name] Note: All result statuses are Final unless otherwise noted.  Tests: (1) Colonoscopy (COL)   COL Colonoscopy           DONE     [facility]     [address]     [place], [place]  [address]           COLONOSCOPY PROCEDURE REPORT           PATIENT:  Amanda, [name]  MR#:  [id number]     BIRTHDATE:  1941-02-02, 68 yrs. old  GENDER:  female     ENDOSCOPIST:  Hedwig Morton. Juanda Chance, MD     REF. BY:  [name], M.D.     PROCEDURE DATE:  10/25/2009     PROCEDURE:  Colonoscopy 78[address]     ASA CLASS:  Class I     INDICATIONS:  hx of microscopic colitis 2000     prior colonoscopy 08/2004, no colitis     MEDICATIONS:   Versed 9 mg, Fentanyl 75 mcg           DESCRIPTION OF PROCEDURE:   After the risks benefits and     alternatives of the procedure were thoroughly explained, informed     consent was obtained.  Digital rectal exam was performed and     revealed no rectal masses.   The LB [id number] endoscope     was introduced through the anus and advanced to the cecum, which     was identified by both the appendix and ileocecal valve, without     limitations.  The quality of the prep was good, using MiraLax.     The instrument was then slowly withdrawn as the colon was fully     examined.     <<PROCEDUREIMAGES>>           FINDINGS:  No polyps or cancers were seen. random biopsdies to r/o     microscopic colitis With standard forceps, biopsy was obtained and     sent to pathology (see image1, image2, and image3).   Retroflexed     views in the rectum revealed no abnormalities.    The scope was     then withdrawn from the patient and the procedure completed.           COMPLICATIONS:  None     ENDOSCOPIC IMPRESSION:     1) No polyps or cancers     2) Normal colonoscopy     RECOMMENDATIONS:     1) Await biopsy results     2) High fiber diet.     REPEAT EXAM:  In 10 year(s) for.           ______________________________  Hedwig Morton. Juanda Chance, MD           CC:           n.     eSIGNED:   Hedwig Morton. Kurstin Dimarzo at 10/25/2009 03:00 PM           Carrie Mew, [id number]  Note: An exclamation mark (!) indicates a result that was not dispersed into the flowsheet. Document Creation Date: 10/25/2009 3:01 PM _______________________________________________________________________  (1) Order result status: Final Collection or observation date-time: 10/25/2009 14:34 Requested date-time:  Receipt date-time:  Reported date-time:  Referring Physician:   Ordering Physician: Lina Sar [phone]) Specimen Source:  Source: Launa Grill Order Number: [id number] Lab site:   Appended Document: Colonoscopy     Procedures Next Due Date:  Colonoscopy: 11/2019

## 2010-08-08 NOTE — Letter (Signed)
Summary: Amanda [name] Office Visit  [facility] Office Visit   Imported By: Lamona Curl CMA (AAMA) 03/22/2010 15:54:39  _____________________________________________________________________  External Attachment:    Type:   Image     Comment:   External Document

## 2010-08-08 NOTE — Progress Notes (Signed)
Summary: Triage  Phone Note Call from Patient Call back at Home Phone [phone]   Caller: Patient Call For: Dr. Juanda Chance Reason for Call: Talk to Nurse Summary of Call: diarrhea, gas pains...taking Imodium and has questions Initial call taken by: Karna Christmas,  June 13, 2010 3:29 PM  Follow-up for Phone Call        Patient states that when she came to MD visit in September she thought the diarrhea was getting better but it started up again. Patient states she gets up in AM and has diarrhea stool with some formed balls of stool that is yellowish-brown in color. She states the stool smells bad. Denies urgency with diarrhea.She reports stomach cramps prior to the diarrhea. When she has the bowel movement, she also has explosive gas. She then takes one Imodium and does not have diarrhea for the rest of the day. This goes on every day. Patient states she has not called to report this because she knew she was to return in March for follow up labs and she thought it would go away but it has not. Please, advise. Follow-up by: Jesse Fall RN,  June 13, 2010 3:57 PM  Additional Follow-up for Phone Call Additional follow up Details #1::        Please try C ipro 250 mg by mouth two times a day x 5 days. And Bentyl 20mg , # 40 1 by mouth two times a day. Call us  with an update in 2 weeks. Additional Follow-up by: Hart Carwin MD,  June 14, 2010 8:32 AM    Additional Follow-up for Phone Call Additional follow up Details #2::    Patient is allergic to Tetracycline, Sulfa, Keflex, Erythromycin, Cipro. She has taken Amoxycillin without problems.Jesse Fall RN  June 14, 2010 8:46 AM Patient notified of Dr. Regino Schultze recommendations. Rx's sent to patient's pharmacy. She will call with an update in 2 weeks or earlier if problems worsen. Follow-up by: Jesse Fall RN,  June 14, 2010 10:01 AM  Additional Follow-up for Phone Call Additional follow up Details #3:: Details for  Additional Follow-up Action Taken: OK Amoxacillin 5oomg by mouth two times a day x 1 week, # 14, Additional Follow-up by: Hart Carwin MD,  June 14, 2010 9:31 AM  New/Updated Medications: BENTYL 20 MG TABS (DICYCLOMINE HCL) Take one by mouth BID AMOXICILLIN 500 MG CAPS (AMOXICILLIN) Take one by mouth two times a day x 1 week Prescriptions: AMOXICILLIN 500 MG CAPS (AMOXICILLIN) Take one by mouth two times a day x 1 week  #14 x 0   Entered by:   Jesse Fall RN   Authorized by:   Hart Carwin MD   Signed by:   Jesse Fall RN on 06/14/2010   Method used:   Electronically to        [facility] (retail)       [address]       [place], [place]  [id number]       Ph: [phone] or [phone]       Fax: [phone]   RxID:   [id number] BENTYL 20 MG TABS (DICYCLOMINE HCL) Take one by mouth BID  #40 x 0   Entered by:   Jesse Fall RN   Authorized by:   Hart Carwin MD   Signed by:   Jesse Fall RN on 06/14/2010   Method used:   Electronically to        [facility] (retail)       [address]       [place], [place]  [address]       Ph: [phone] or [phone]       Fax: [phone]   RxID:   [id number]

## 2010-08-08 NOTE — Letter (Signed)
Summary: Colonoscopy Letter  [facility]  [address] [place], [place] [address]   Phone: [phone]  Fax: [phone]      August 31, 2009 MRN: [id number]   [facility] [address] [place], [place]  [address]   Dear Amanda [name],   According to your medical record, it is time for you to schedule a Colonoscopy. The American Cancer Society recommends this procedure as a method to detect early colon cancer. Patients with a family history of colon cancer, or a personal history of colon polyps or inflammatory bowel disease are at increased risk.  This letter has beeen generated based on the recommendations made at the time of your procedure. If you feel that in your particular situation this may no longer apply, please contact our office.  Please call our office at [phone] to schedule this appointment or to update your records at your earliest convenience.  Thank you for cooperating with [place] to provide you with the very best care possible.   Sincerely,  Hedwig Morton. Juanda Chance, M.D  [facility] [phone]

## 2010-08-10 NOTE — Assessment & Plan Note (Signed)
Summary: f/u diarrhea--ch.   History of Present Illness Visit Type: Follow-up Visit Primary GI MD: Lina Sar MD Primary Provider: [name], MD Requesting Provider: na Chief Complaint: F/u for diarrhea. Pt states that she never started her Metronidazole because the diarrhea started clearing up but diarrhea is back now History of Present Illness:   This is a 70 year old white female with chronic intermittent diarrhea. She has a history of microscopic/ collagenous colitis in 2000 which was seen on  biopsies. Subsequent exams in 2006 and 2011 did not show any evidence of colitis. Since August 2011, she has had watery stools alternating with normal bowel movements. She has a history of irritable bowel syndrome. She denies rectal bleeding or pain. Her weight has been somewhat decreased from 135 to 130 pounds. Stool specimens showed positive lactoferrin. Her stool culture was negative. A sprue profile in 2000 was negative. She is on a thyroid supplement. Her last TSH in August 2011 was normal.   GI Review of Systems      Denies abdominal pain, acid reflux, belching, bloating, chest pain, dysphagia with liquids, dysphagia with solids, heartburn, loss of appetite, nausea, vomiting, vomiting blood, weight loss, and  weight gain.      Reports diarrhea.     Denies anal fissure, black tarry stools, change in bowel habit, constipation, diverticulosis, fecal incontinence, heme positive stool, hemorrhoids, irritable bowel syndrome, jaundice, light color stool, liver problems, rectal bleeding, and  rectal pain.    Current Medications (verified): 1)  Synthroid 75 Mcg Tabs (Levothyroxine Sodium) .... One Tablet By Mouth Once Daily 2)  Pravachol 20 Mg Tabs (Pravastatin Sodium) .... Take One By Mouth Once Daily 3)  Calcitriol 0.25 Mcg Caps (Calcitriol) .... One Tablet By Mouth Once Daily 4)  Flonase 50 Mcg/act Susp (Fluticasone Propionate) .... Once Daily 5)  Acidophilus Xtra  Tabs (Probiotic Product)  .... One Tablet By Mouth Two Times A Day 6)  Amiloride-Hydrochlorothiazide 5-50 Mg Tabs (Amiloride-Hydrochlorothiazide) .... One Tablet By Mouth Once Daily 7)  Claritin 10 Mg Tabs (Loratadine) .... One Tablet By Mouth Once Daily 8)  Aspirin 325 Mg  Tabs (Aspirin) .... One Tablet By Mouth Once Daily 9)  Multivitamins   Tabs (Multiple Vitamin) .... One Tablet By Mouth Once Daily.. On Hold 10)  Fish Oil 1000 Mg Caps (Omega-3 Fatty Acids) .... 2 Tablets By Mouth Once Daily--On Hold 11)  Glucosamine 500 Mg Caps (Glucosamine Sulfate) .... 2 Capsules By Mouth Once Daily 12)  Lutein 6 Mg Caps (Lutein) .... One Tablet By Mouth Once Daily 13)  Metrogel 1 % Gel (Metronidazole) .... Externally Once Daily 14)  Westcort 0.2 % Oint (Hydrocortisone Valerate) .... 2 Tablets By Mouth Once Daily As Needed  Allergies (verified): 1)  ! Tetracycline 2)  ! Sulfa 3)  ! Keflex 4)  ! Erythromycin 5)  ! Cipro  Past History:  Past Medical History: HYPOKALEMIA, HX OF (ICD-V12.2) HYPERTENSION (ICD-401.9) DYSPHAGIA UNSPECIFIED (ICD-787.20)  Hyperlipidemia Hypothyroidism Interstitial Cystitis Irritable Bowel Syndrome  Past Surgical History: Reviewed history from 10/05/2009 and no changes required. Hysterectomy Breast biopsy  Family History: Reviewed history from 10/05/2009 and no changes required. Family History of Ovarian Cancer:Sister No FH of Colon Cancer: Family History of Heart Disease: Mother, Brother, Father  Social History: Retired Alcohol Use - yes-occasional Illicit Drug Use - no Patient is a former smoker.   Review of Systems  The patient denies allergy/sinus, anemia, anxiety-new, arthritis/joint pain, back pain, blood in urine, breast changes/lumps, change in vision, confusion, cough,  coughing up blood, depression-new, fainting, fatigue, fever, headaches-new, hearing problems, heart murmur, heart rhythm changes, itching, menstrual pain, muscle pains/cramps, night sweats, nosebleeds,  pregnancy symptoms, shortness of breath, skin rash, sleeping problems, sore throat, swelling of feet/legs, swollen lymph glands, thirst - excessive , urination - excessive , urination changes/pain, urine leakage, vision changes, and voice change.         Pertinent positive and negative review of systems were noted in the above HPI. All other ROS was otherwise negative.   Vital Signs:  Patient profile:   70 year old female Height:      67.5 inches Weight:      140 pounds BMI:     21.68 BSA:     1.75 Pulse rate:   96 / minute Pulse rhythm:   regular BP sitting:   132 / 80  (left arm) Cuff size:   regular  Vitals Entered By: Ok Anis CMA (July 24, 2010 11:39 AM)  Physical Exam  General:  Well developed, well nourished, no acute distress. Eyes:  PERRLA, no icterus. Mouth:  No deformity or lesions, dentition normal. Neck:  Supple; no masses or thyromegaly. Lungs:  Clear throughout to auscultation. Heart:  rapid S1-S2, no murmur. Abdomen:  soft nontender abdomen with hyperactive bowel sounds and increased tympany. Liver edge at costal margin. No palpable mass. Extremities:  No clubbing, cyanosis, edema or deformities noted. Skin:  Intact without significant lesions or rashes. Psych:  Alert and cooperative. Normal mood and affect.   Impression & Recommendations:  Problem # 1:  DIARRHEA (ICD-787.91) Paitent has ongoing diarrhea, likely irritable bowel syndrome. We need to rule out recurrent collagenous colitis, sprue or bacterial overgrowth. We also need to rule out hyperthyroidism. We will proceed with an empirical trial of prednisone 20 mg a day for 3 days, 15 mg a day for 3 days, 10 mg a day for 3 days, 5 mg a day for 3 days then discontinue. If not helpful, we may need to consider Flagyl 250 mg 3 times a day. We will recheck her sprue profile today as well as a TSH, sedimentation rate and IgA level. Orders: TLB-TSH (Thyroid Stimulating Hormone) (84443-TSH) TLB-Sedimentation  Rate (ESR) (85652-ESR) TLB-IgA (Immunoglobulin A) (82784-IGA) T-Sprue Panel (Celiac Disease Aby Eval) (83516x3/[address]) T-Tissue Transglutamase Ab IgA (30160-10932) T- * Misc. Laboratory test [phone])  Problem # 2:  GLOMERULONEPHRITIS (ICD-583.9) Patient is followed by Dr Deterding.  Patient Instructions: 1)  Your physician requests that you go to the basement floor of our office to have the following labwork completed before leaving today: TSH, Sprue Panel, Sedimentation Rate, IgA level. 2)  Please pick up your prescriptions at the pharmacy. Electronic prescription(s) has already been sent for Prednisone 10 mg. You should take it as follows:    3)   Take 2 tablets by mouth x 3 days. 4)   Take 1 1/2 tablets by mouth x 3 days. 5)   Take 1 tablet by mouth x 3 days. 6)   Take 1/2 tablet by mouth x 3 days. 7)   Then, DISCONTINUE 8)  Copy sent to : [name], MD 9)  The medication list was reviewed and reconciled.  All changed / newly prescribed medications were explained.  A complete medication list was provided to the patient / caregiver. Prescriptions: PREDNISONE 10 MG TABS (PREDNISONE) Take as directed  #20 x 0   Entered by:   Lamona Curl CMA (AAMA)   Authorized by:   Hart Carwin MD   Signed  by:   Lamona Curl CMA (AAMA) on 07/24/2010   Method used:   Electronically to        [facility] (retail)       [address]       [place], [place]  [address]       Ph: [phone] or [phone]       Fax: [phone]   RxID:   [id number]

## 2010-08-10 NOTE — Progress Notes (Signed)
  Phone Note Outgoing Call Call back at [facility] Phone [phone]   Call placed by: Jesse Fall, RN Call placed to: Patient Summary of Call: Message left for patient to call back with update on how she is doing. Jesse Fall RN  July 11, 2010 11:21 AM' Patient returned our call. She states she did not start the Flagyl because she had a "respiratory virus." She states she is some better now. She only has diarrhea 2-3 times in AM and is not having any cramps.  Initial call taken by: Jesse Fall RN,  July 11, 2010 11:54 AM  Follow-up for Phone Call        Then it is OK to hold the Flagyl unless she has a recurrent diarrhea                   Follow-up by: Hart Carwin MD,  July 11, 2010 12:47 PM  Additional Follow-up for Phone Call Additional follow up Details #1::        Patient notified of Dr. Regino Schultze recommedation. She wants to keep the 07/24/10 appointment for now. Additional Follow-up by: Jesse Fall RN,  July 11, 2010 1:42 PM

## 2010-08-10 NOTE — Progress Notes (Signed)
  Phone Note Outgoing Call Call back at [facility] Phone [phone]   Call placed by: Jesse Fall, RN Call placed to: Patient Summary of Call: Called patient to see how she was doing after taking Amoxycillin for diarrhea. Patient states she finished the antibiotic on Saturday 06/24/10 and the diarrhea was better. Diarrhea started again yesterday. She states she had 5 diarrhea stools yesterday. She is having diarrhea today also. Denies bleeding. Cramps occur prior to diarrhea episode.  She also has developed bronchitis. Patient has allergy to: Tetracycline, Sulfa, Keflex, Erythromycin and Cipro. Initial call taken by: Jesse Fall RN,  June 28, 2010 9:35 AM  Follow-up for Phone Call        Please try Flagyl 250 mg by mouth three times a day x 7 days, #21, 1 refill. Please obtain stool Lactoferrin and C&S. If no better call in 1 week. I will consider short course of Prednisone ( she had microspcopic colitis in 2000). Follow-up by: Hart Carwin MD,  June 28, 2010 10:41 PM  Additional Follow-up for Phone Call Additional follow up Details #1::        Message left for patient to call back.Jesse Fall, RN 06/29/10 Jesse Fall RN  June 29, 2010 9:08 AM  Spoke with patient. She will come today and pick up specimen cups. Rx sent to patient's pharmacy. Patient to call back in one week with update. Additional Follow-up by: Jesse Fall RN,  June 29, 2010 10:55 AM    New/Updated Medications: METRONIDAZOLE 250 MG TABS (METRONIDAZOLE) take one tablet three times a day for one week Prescriptions: METRONIDAZOLE 250 MG TABS (METRONIDAZOLE) take one tablet three times a day for one week  #21 x 1   Entered by:   Jesse Fall RN   Authorized by:   Hart Carwin MD   Signed by:   Jesse Fall RN on 06/29/2010   Method used:   Electronically to        [facility] (retail)       [address]       [place], [place]  [id number]       Ph: [phone] or [phone]  Fax: [phone]   RxID:   [id number]

## 2010-09-27 ENCOUNTER — Other Ambulatory Visit: Payer: Self-pay

## 2010-11-24 NOTE — Assessment & Plan Note (Signed)
[facility]                         GASTROENTEROLOGY OFFICE NOTE   NAME:Amanda [name], Amanda [name]                   MRN:          [id number]  DATE:06/11/2006                            DOB:          04-30-1941    Amanda [name] is a 70 year old, white female who had an acute diarrhea  starting September of this year.  It started after she took a course of  antibiotics, amoxicillin 3 times a day for 5 days for sinus problems and  an upper respiratory infection.  The diarrhea started several weeks  after she finished the antibiotic.  She had a similar episode of  diarrhea several years ago.  At that time, she was quite stressed out  and we thought it was due to irritable bowel syndrome.  She had 2  colonoscopies in the past.  The last one in February 2006 did not show  any evidence of microscopic or collagenous colitis.  The diarrhea  subsided spontaneously.  This time, the diarrhea also subsided  spontaneously.  Her stool cultures by Dr. [name] were negative for C.  difficile.  She takes probiotics on a daily basis.  There was no blood  per rectum and no abdominal pain.  There is no family history of  inflammatory bowel disease but her husband is an outpatient for his  Crohn's disease.  She now is back to normal bowel habits, 1-2 bowel  movements a day.   MEDICATIONS:  1. Fexofenadine 60 mg p.o. daily.  2. Lipitor 10 mg p.o. daily.  3. Amilor/hydrochlorothiazide 50 one p.o. daily.  4. Calcitriol.  5. Synthroid 0.75 mcg daily.  6. Vivelle patch.  7. Glucosamine.  8. Fish oil.  9. Vitamins.  10.Aspirin.  11.Acidophilus.  12.Lutein 6 mg daily.   PHYSICAL EXAM:  Blood pressure 140/66.  Pulse 60 and weight 143 pounds.  She appears healthy today.  No distress.  LUNGS:  Clear to auscultation.  COR:  Normal S1 and normal S2.  Sclerae nonicteric.  NECK:  Supple without adenopathy.  ABDOMEN:  Soft with normoactive bowel sounds.  No distention.  No  tenderness.  Liver edge at costal margin.  RECTAL:  With small external hemorrhoidal tags.  Rectal tone was normal.  Stool was brown.  Hemoccult  negative.  EXTREMITIES:  No edema.   IMPRESSION:  A 70 year old white female with acute diarrheal illness  which, since then, has subsided spontaneously.  I am not sure of the  etiology of it but I assume this was an infectious cause either due to  bacterial overgrowth related to previous antibiotics or due to actual  Clostridium difficile colitis, although her Clostridium difficile toxin  was negative.  Since she has improved spontaneously, I believe this was  a minor infection.  I also feel that there is a possibility that her  sinus and upper respiratory infection was caused by a normal virus and  caused a subsequent gastroenteritis.  By taking probiotics, she might  have improved her bacterial flora and recovered spontaneously without  use of Flagyl or any other medication.  She is currently well.  There is  no evidence of  GI blood loss.  She is up to date on her colonoscopy.   PLAN:  1. Continue high-fiber diet.  2. Continue probiotics.  3. Next colonoscopy in 10 years, that will be in February 2016.  4. I will be happy to see her in the future.  I told her to make sure      that we see her as an acute work-in if she gets acute diarrhea.     Hedwig Morton. Juanda Chance, MD  Electronically Signed    DMB/MedQ  DD: 06/11/2006  DT: 06/11/2006  Job #: [id number]   cc:   Fayrene Fearing L. Deterding, M.D.  Pam Drown, M.D.

## 2011-02-16 ENCOUNTER — Other Ambulatory Visit: Payer: Self-pay | Admitting: Family Medicine

## 2011-02-16 ENCOUNTER — Ambulatory Visit
Admission: RE | Admit: 2011-02-16 | Discharge: 2011-02-16 | Disposition: A | Discharge status: Home / Self Care | Payer: Medicare Other | Source: Ambulatory Visit | Attending: Family Medicine | Admitting: Family Medicine

## 2011-04-01 ENCOUNTER — Emergency Department (HOSPITAL_COMMUNITY): Payer: Medicare Other

## 2011-04-01 ENCOUNTER — Emergency Department (HOSPITAL_COMMUNITY)
Admission: EM | Admit: 2011-04-01 | Discharge: 2011-04-01 | Disposition: A | Discharge status: Home / Self Care | Payer: Medicare Other | Attending: Emergency Medicine | Admitting: Emergency Medicine

## 2011-04-01 DIAGNOSIS — E039 Hypothyroidism, unspecified: Secondary | ICD-10-CM | POA: Insufficient documentation

## 2011-04-01 DIAGNOSIS — I440 Atrioventricular block, first degree: Secondary | ICD-10-CM | POA: Insufficient documentation

## 2011-04-01 DIAGNOSIS — R0789 Other chest pain: Secondary | ICD-10-CM | POA: Insufficient documentation

## 2011-04-01 DIAGNOSIS — R61 Generalized hyperhidrosis: Secondary | ICD-10-CM | POA: Insufficient documentation

## 2011-04-01 DIAGNOSIS — I10 Essential (primary) hypertension: Secondary | ICD-10-CM | POA: Insufficient documentation

## 2011-04-01 DIAGNOSIS — E785 Hyperlipidemia, unspecified: Secondary | ICD-10-CM | POA: Insufficient documentation

## 2011-04-01 DIAGNOSIS — R110 Nausea: Secondary | ICD-10-CM | POA: Insufficient documentation

## 2011-04-01 DIAGNOSIS — N059 Unspecified nephritic syndrome with unspecified morphologic changes: Secondary | ICD-10-CM | POA: Insufficient documentation

## 2011-04-01 DIAGNOSIS — R0602 Shortness of breath: Secondary | ICD-10-CM | POA: Insufficient documentation

## 2011-04-01 LAB — TROPONIN I: Troponin I: <0.3 ng/mL (ref <0.30)

## 2011-04-01 LAB — POCT I-STAT, CHEM 8
BUN: 31 mg/dL — ABNORMAL HIGH (ref 6–23)
Calcium, Ion: 1.28 mmol/L (ref 1.12–1.32)
Chloride: 104 meq/L (ref 96–112)
Creatinine, Ser: 1.4 mg/dL — ABNORMAL HIGH (ref 0.50–1.10)
Glucose, Bld: 113 mg/dL — ABNORMAL HIGH (ref 70–99)
HCT: 41 % (ref 36.0–46.0)
Hemoglobin: 13.9 g/dL (ref 12.0–15.0)
Potassium: 3.5 meq/L (ref 3.5–5.1)
Sodium: 141 meq/L (ref 135–145)
TCO2: 27 mmol/L (ref 0–100)

## 2011-04-18 ENCOUNTER — Other Ambulatory Visit: Payer: Self-pay | Admitting: Obstetrics and Gynecology

## 2011-04-18 DIAGNOSIS — Z1231 Encounter for screening mammogram for malignant neoplasm of breast: Secondary | ICD-10-CM

## 2011-04-26 ENCOUNTER — Other Ambulatory Visit: Payer: Self-pay | Admitting: Obstetrics and Gynecology

## 2011-04-26 DIAGNOSIS — N951 Menopausal and female climacteric states: Secondary | ICD-10-CM

## 2011-05-18 ENCOUNTER — Ambulatory Visit
Admission: RE | Admit: 2011-05-18 | Discharge: 2011-05-18 | Disposition: A | Discharge status: Home / Self Care | Payer: Medicare Other | Source: Ambulatory Visit | Attending: Obstetrics and Gynecology | Admitting: Obstetrics and Gynecology

## 2011-05-18 ENCOUNTER — Ambulatory Visit: Payer: Medicare Other

## 2011-05-18 DIAGNOSIS — Z1231 Encounter for screening mammogram for malignant neoplasm of breast: Secondary | ICD-10-CM

## 2011-05-18 DIAGNOSIS — N951 Menopausal and female climacteric states: Secondary | ICD-10-CM

## 2011-10-12 ENCOUNTER — Encounter: Payer: Self-pay | Admitting: Internal Medicine

## 2012-04-29 ENCOUNTER — Other Ambulatory Visit: Payer: Self-pay | Admitting: Obstetrics and Gynecology

## 2012-04-29 DIAGNOSIS — Z1231 Encounter for screening mammogram for malignant neoplasm of breast: Secondary | ICD-10-CM

## 2012-06-10 ENCOUNTER — Ambulatory Visit
Admission: RE | Admit: 2012-06-10 | Discharge: 2012-06-10 | Disposition: A | Discharge status: Home / Self Care | Payer: Self-pay | Source: Ambulatory Visit | Attending: Obstetrics and Gynecology | Admitting: Obstetrics and Gynecology

## 2012-06-10 DIAGNOSIS — Z1231 Encounter for screening mammogram for malignant neoplasm of breast: Secondary | ICD-10-CM

## 2012-10-02 ENCOUNTER — Other Ambulatory Visit: Payer: Self-pay | Admitting: Dermatology

## 2013-05-21 ENCOUNTER — Other Ambulatory Visit: Payer: Self-pay

## 2013-05-21 DIAGNOSIS — Z1231 Encounter for screening mammogram for malignant neoplasm of breast: Secondary | ICD-10-CM

## 2013-06-24 ENCOUNTER — Ambulatory Visit: Payer: Self-pay

## 2013-06-29 ENCOUNTER — Telehealth: Payer: Self-pay | Admitting: Neurology

## 2013-06-29 ENCOUNTER — Ambulatory Visit
Admission: RE | Admit: 2013-06-29 | Discharge: 2013-06-29 | Disposition: A | Discharge status: Home / Self Care | Payer: Medicare Other | Source: Ambulatory Visit

## 2013-06-29 DIAGNOSIS — Z1231 Encounter for screening mammogram for malignant neoplasm of breast: Secondary | ICD-10-CM

## 2013-06-29 NOTE — Telephone Encounter (Signed)
Patient would like a copy of her medical records. Please call the patient.

## 2013-07-22 ENCOUNTER — Ambulatory Visit: Payer: Self-pay | Admitting: Obstetrics and Gynecology

## 2013-11-11 ENCOUNTER — Encounter: Payer: Self-pay | Admitting: General Surgery

## 2013-11-11 DIAGNOSIS — I341 Nonrheumatic mitral (valve) prolapse: Secondary | ICD-10-CM | POA: Insufficient documentation

## 2013-11-20 ENCOUNTER — Encounter: Payer: Self-pay | Admitting: Internal Medicine

## 2013-11-25 ENCOUNTER — Encounter: Payer: Self-pay | Admitting: *Deleted

## 2013-11-26 ENCOUNTER — Encounter: Payer: Self-pay | Admitting: Cardiology

## 2013-11-26 ENCOUNTER — Ambulatory Visit (INDEPENDENT_AMBULATORY_CARE_PROVIDER_SITE_OTHER): Payer: Medicare Other | Admitting: Cardiology

## 2013-11-26 VITALS — BP 114/64 | HR 79 | Ht 67.5 in | Wt 142.0 lb

## 2013-11-26 DIAGNOSIS — I4510 Unspecified right bundle-branch block: Secondary | ICD-10-CM

## 2013-11-26 DIAGNOSIS — I340 Nonrheumatic mitral (valve) insufficiency: Secondary | ICD-10-CM

## 2013-11-26 DIAGNOSIS — I491 Atrial premature depolarization: Secondary | ICD-10-CM | POA: Insufficient documentation

## 2013-11-26 DIAGNOSIS — I10 Essential (primary) hypertension: Secondary | ICD-10-CM

## 2013-11-26 DIAGNOSIS — I059 Rheumatic mitral valve disease, unspecified: Secondary | ICD-10-CM

## 2013-11-26 DIAGNOSIS — I341 Nonrheumatic mitral (valve) prolapse: Secondary | ICD-10-CM

## 2013-11-26 LAB — BASIC METABOLIC PANEL
BUN: 31 mg/dL — ABNORMAL HIGH (ref 6–23)
CALCIUM: 10.1 mg/dL (ref 8.4–10.5)
CO2: 30 meq/L (ref 19–32)
CREATININE: 1.6 mg/dL — AB (ref 0.4–1.2)
Chloride: 100 mEq/L (ref 96–112)
GFR: 34.42 mL/min — ABNORMAL LOW (ref >60.00)
GLUCOSE: 97 mg/dL (ref 70–99)
Potassium: 3.5 mEq/L (ref 3.5–5.1)
Sodium: 138 mEq/L (ref 135–145)

## 2013-11-26 NOTE — Progress Notes (Signed)
[address] [place], [place]  [address] Phone: [phone] Fax:  [phone]  Date:  11/26/2013   ID:  [name], DOB [birth year], MRN [id number]  PCP:  [name], MD  Cardiologist:  Armanda Magic, MD     History of Present Illness: Amanda [name] is a 73 y.o. female with a history of PAC's, MVP with mild MR and HTN who presents today for followup.  She is doing well.  She denies any chest pain, SOB, DOE, LE edema, palpitations or syncope.  She did have a dizzy episode after working out in the yard the the extreme heat.   Wt Readings from Last 3 Encounters:  11/26/13 142 lb (64.411 kg)  07/24/10 140 lb (63.504 kg)  03/27/10 143 lb 12.8 oz (65.227 kg)     Past Medical History  Diagnosis Date  . Allergic rhinitis   . Hyperparathyroidism   . Seasonal allergies   . DJD (degenerative joint disease)   . MVP (mitral valve prolapse)     moderate posterior MVP with moderate MR and grade II diasotlic dysfunction  . Hypothyroidism   . Hyperlipidemia   . Anemia   . Small vessel disease, cerebrovascular   . Chronic kidney disease     stage III  . IBS (irritable bowel syndrome)   . Glomerulonephritis     Dr [name]  . PAC (premature atrial contraction)   . Hypokalemia   . Interstitial cystitis   . Hypertension     Current Outpatient Prescriptions  Medication Sig Dispense Refill  . AMILORIDE-HYDROCHLOROTHIAZIDE PO Take 5-50 mg by mouth daily.      [name] aspirin 325 MG tablet Take 325 mg by mouth daily.      . calcitRIOL (ROCALTROL) 0.25 MCG capsule Take 0.25 mcg by mouth every other day.      . fluticasone (FLONASE) 50 MCG/ACT nasal spray Place 2 sprays into both nostrils as needed for allergies or rhinitis.      . hydrocortisone valerate ointment (WEST-CORT) 0.2 % Apply 1 application topically 2 (two) times daily.      [name] levothyroxine (SYNTHROID, LEVOTHROID) 75 MCG tablet Take 75 mcg by mouth daily before breakfast.      . loratadine (CLARITIN) 10 MG  tablet Take 10 mg by mouth daily as needed for allergies.      . metroNIDAZOLE (METROGEL) 1 % gel Apply 1 application topically daily.      . Probiotic Product (PROBIOTIC DAILY PO) Take 1 tablet by mouth daily.       No current facility-administered medications for this visit.    Allergies:    Allergies  Allergen Reactions  . Cephalexin Rash  . Ciprofloxacin Rash  . Erythromycin Rash  . Sulfonamide Derivatives Rash  . Tetracycline Rash    Social History:  The patient  reports that she has quit smoking. She does not have any smokeless tobacco history on file. She reports that she does not drink alcohol or use illicit drugs.   Family History:  The patient's family history includes Heart disease in her brother, father, and mother; Ovarian cancer in her sister. There is no history of Colon cancer.   ROS:  Please see the history of present illness.      All other systems reviewed and negative.   PHYSICAL EXAM: VS:  BP 114/64  Pulse 79  Ht 5' 7.5" (1.715 m)  Wt 142 lb (64.411 kg)  BMI 21.90 kg/m2 Well nourished, well developed, in no acute distress  HEENT: normal Neck: no JVD Cardiac:  normal S1, S2; RRR;2/6 late systolic murmur at LLSB to apex Lungs:  clear to auscultation bilaterally, no wheezing, rhonchi or rales Abd: soft, nontender, no hepatomegaly Ext: no edema Skin: warm and dry Neuro:  CNs 2-12 intact, no focal abnormalities noted  EKG:  NSR with 1st degree AV block and RBBB     ASSESSMENT AND PLAN:  1. Moderate posterior MVP with moderate MR - echo is scheduled for next month to reevaluate 2. HTN well controlled - continue diuretic - check BMET 3. PAC's - asymptomatic 4. New RBBB - Lexiscan myoveiw to rule out ischemia  Followup with me in 1 year  Signed, Armanda Magicraci Turner, MD 11/26/2013 9:12 AM

## 2013-11-26 NOTE — Patient Instructions (Signed)
Your physician recommends that you continue on your current medications as directed. Please refer to the Current Medication list given to you today.  Your physician recommends that you go to the lab today for a BMET  Your physician has requested that you have a lexiscan myoview. For further information please visit [email or web address] Please follow instruction sheet, as given. ( Pt would like to have same day as her already scheduled ECHO if possible)  Your physician wants you to follow-up in: 1 year with Dr Sherlyn Lick will receive a reminder letter in the mail two months in advance. If you don't receive a letter, please call our office to schedule the follow-up appointment.

## 2013-11-27 ENCOUNTER — Other Ambulatory Visit: Payer: Self-pay | Admitting: *Deleted

## 2013-11-27 DIAGNOSIS — I10 Essential (primary) hypertension: Secondary | ICD-10-CM

## 2013-11-27 MED ORDER — AMLODIPINE BESYLATE 2.5 MG PO TABS: 2.5000 mg | ORAL_TABLET | Freq: Every day | ORAL | Status: DC

## 2013-12-01 ENCOUNTER — Other Ambulatory Visit (INDEPENDENT_AMBULATORY_CARE_PROVIDER_SITE_OTHER): Payer: Medicare Other

## 2013-12-01 DIAGNOSIS — I10 Essential (primary) hypertension: Secondary | ICD-10-CM

## 2013-12-01 LAB — BASIC METABOLIC PANEL
BUN: 24 mg/dL — AB (ref 6–23)
CHLORIDE: 103 meq/L (ref 96–112)
CO2: 30 mEq/L (ref 19–32)
CREATININE: 1.5 mg/dL — AB (ref 0.4–1.2)
Calcium: 9.6 mg/dL (ref 8.4–10.5)
GFR: 37.45 mL/min — AB (ref >60.00)
GLUCOSE: 101 mg/dL — AB (ref 70–99)
Potassium: 3.2 mEq/L — ABNORMAL LOW (ref 3.5–5.1)
Sodium: 140 mEq/L (ref 135–145)

## 2013-12-03 ENCOUNTER — Other Ambulatory Visit: Payer: Self-pay

## 2013-12-03 DIAGNOSIS — E876 Hypokalemia: Secondary | ICD-10-CM

## 2013-12-03 MED ORDER — POTASSIUM CHLORIDE CRYS ER 20 MEQ PO TBCR: EXTENDED_RELEASE_TABLET | ORAL | Status: DC

## 2013-12-04 ENCOUNTER — Other Ambulatory Visit (INDEPENDENT_AMBULATORY_CARE_PROVIDER_SITE_OTHER): Payer: Medicare Other

## 2013-12-04 DIAGNOSIS — E876 Hypokalemia: Secondary | ICD-10-CM

## 2013-12-04 LAB — BASIC METABOLIC PANEL
BUN: 21 mg/dL (ref 6–23)
CALCIUM: 9.5 mg/dL (ref 8.4–10.5)
CO2: 29 mEq/L (ref 19–32)
CREATININE: 1.3 mg/dL — AB (ref 0.4–1.2)
Chloride: 107 mEq/L (ref 96–112)
GFR: 42.48 mL/min — AB (ref >60.00)
GLUCOSE: 92 mg/dL (ref 70–99)
Potassium: 3.5 mEq/L (ref 3.5–5.1)
SODIUM: 142 meq/L (ref 135–145)

## 2013-12-07 ENCOUNTER — Telehealth: Payer: Self-pay | Admitting: Cardiology

## 2013-12-07 NOTE — Telephone Encounter (Signed)
Spoke with pt and she doesn't want to start a new medication until [name] has personally talked with Dr. [name]. Also, she took the Amiloride/hctz for the last two days and I told pt to stay off this medication because of decreased renal function. She agrees to stay off amiloride/hctz, but states she will take the risk of not being on a BP medication until she sees Dr. [name] next Monday, unless [name] speaks Dr. [name] before then.

## 2013-12-07 NOTE — Telephone Encounter (Signed)
Message copied by Theda Sers on Mon Dec 07, 2013  1:43 PM ------      Message from: Armanda Magic R      Created: Mon Dec 07, 2013  9:37 AM       Stop Amlodipine and start Toprol XL 12.5mg  daily.  Check BP daily for a week and call with results.  Renal function improved off Amiloride/HCTZ ------

## 2013-12-08 ENCOUNTER — Encounter: Payer: Self-pay | Admitting: Cardiology

## 2013-12-08 NOTE — Telephone Encounter (Signed)
This encounter was created in error - please disregard.

## 2013-12-08 NOTE — Telephone Encounter (Signed)
Routed to [employer], CMA

## 2013-12-08 NOTE — Telephone Encounter (Signed)
OK to hold on starting Toprol until she is seen by Dr. [name] next monday

## 2013-12-09 NOTE — Telephone Encounter (Signed)
Pt notified. Pt will call back next week with update.

## 2013-12-14 ENCOUNTER — Other Ambulatory Visit (HOSPITAL_COMMUNITY): Payer: Medicare Other

## 2013-12-16 ENCOUNTER — Other Ambulatory Visit: Payer: Self-pay

## 2013-12-16 ENCOUNTER — Other Ambulatory Visit (HOSPITAL_COMMUNITY): Payer: Self-pay | Admitting: Radiology

## 2013-12-16 ENCOUNTER — Ambulatory Visit (HOSPITAL_BASED_OUTPATIENT_CLINIC_OR_DEPARTMENT_OTHER): Payer: Medicare Other | Admitting: Radiology

## 2013-12-16 ENCOUNTER — Ambulatory Visit (HOSPITAL_COMMUNITY): Payer: Medicare Other | Attending: Cardiology | Admitting: Radiology

## 2013-12-16 VITALS — BP 138/64 | Ht 67.5 in | Wt 142.0 lb

## 2013-12-16 DIAGNOSIS — Z8249 Family history of ischemic heart disease and other diseases of the circulatory system: Secondary | ICD-10-CM | POA: Insufficient documentation

## 2013-12-16 DIAGNOSIS — I10 Essential (primary) hypertension: Secondary | ICD-10-CM | POA: Insufficient documentation

## 2013-12-16 DIAGNOSIS — R0609 Other forms of dyspnea: Secondary | ICD-10-CM | POA: Insufficient documentation

## 2013-12-16 DIAGNOSIS — I059 Rheumatic mitral valve disease, unspecified: Secondary | ICD-10-CM

## 2013-12-16 DIAGNOSIS — R42 Dizziness and giddiness: Secondary | ICD-10-CM | POA: Insufficient documentation

## 2013-12-16 DIAGNOSIS — R0989 Other specified symptoms and signs involving the circulatory and respiratory systems: Secondary | ICD-10-CM | POA: Insufficient documentation

## 2013-12-16 DIAGNOSIS — I4510 Unspecified right bundle-branch block: Secondary | ICD-10-CM | POA: Insufficient documentation

## 2013-12-16 DIAGNOSIS — Z87891 Personal history of nicotine dependence: Secondary | ICD-10-CM | POA: Insufficient documentation

## 2013-12-16 DIAGNOSIS — R9431 Abnormal electrocardiogram [ECG] [EKG]: Secondary | ICD-10-CM | POA: Insufficient documentation

## 2013-12-16 DIAGNOSIS — R002 Palpitations: Secondary | ICD-10-CM | POA: Insufficient documentation

## 2013-12-16 MED ORDER — TECHNETIUM TC 99M SESTAMIBI GENERIC - CARDIOLITE
11.0000 | Freq: Once | INTRAVENOUS | Status: AC | PRN
Start: 2013-12-16 — End: 2013-12-16
  Administered 2013-12-16: 11 via INTRAVENOUS

## 2013-12-16 MED ORDER — TECHNETIUM TC 99M SESTAMIBI GENERIC - CARDIOLITE
33.0000 | Freq: Once | INTRAVENOUS | Status: AC | PRN
  Administered 2013-12-16: 33 via INTRAVENOUS

## 2013-12-16 MED ORDER — REGADENOSON 0.4 MG/5ML IV SOLN
0.4000 mg | Freq: Once | INTRAVENOUS | Status: AC
  Administered 2013-12-16: 0.4 mg via INTRAVENOUS

## 2013-12-16 NOTE — Progress Notes (Signed)
Echocardiogram performed.  

## 2013-12-16 NOTE — Progress Notes (Signed)
[facility] SITE 3 NUCLEAR MED [address] [place], [place] [address] [phone]    Cardiology Nuclear Med Study  Amanda [name] is a 73 y.o. female     MRN : [id number]     DOB: 03/01/1941  Procedure Date: 12/16/2013  Nuclear Med Background Indication for Stress Test:  Evaluation for Ischemia and Abnormal EKG (new) RBBB History:  No H/O CAD Cath ok per pt 9/12 MPI: EF; 81% 6/14 ECHO:  NL LVF  Cardiac Risk Factors: Strong Family History - CAD, History of Smoking, Hypertension, Lipids and RBBB  Symptoms:  Dizziness, DOE and Palpitations   Nuclear Pre-Procedure Caffeine/Decaff Intake:  None> 12 hrs NPO After: 5:15am   Lungs:  clear O2 Sat: 97% on room air. IV 0.9% NS with Angio Cath:  22g  IV Site: R Wrist x 1, tolerated well IV Started by:  Amanda Hong, RN  Chest Size (in):  38 Cup Size: B  Height: 5' 7.5" (1.715 m)  Weight:  142 lb (64.411 kg)  BMI:  Body mass index is 21.9 kg/(m^2). Tech Comments:  No medications today.    Nuclear Med Study 1 or 2 day study: 1 day  Stress Test Type:  Lexiscan  Reading MD: N/A  Order Authorizing Provider:  Armanda Magic, MD  Resting Radionuclide: Technetium 73m Sestamibi  Resting Radionuclide Dose: 11.0 mCi   Stress Radionuclide:  Technetium 16m Sestamibi  Stress Radionuclide Dose: 33.0 mCi           Stress Protocol Rest HR: 72 Stress HR: 100  Rest BP: 138/64 Stress BP: 151/55  Exercise Time (min): n/a METS: n/a   Predicted Max HR: 146 bpm % Max HR: 68.49 bpm Rate Pressure Product: 85277   Dose of Adenosine (mg):  n/a Dose of Lexiscan: 0.4 mg  Dose of Atropine (mg): n/a Dose of Dobutamine: n/a mcg/kg/min (at max HR)  Stress Test Technologist: Amanda [name], EMT-P  Nuclear Technologist:  Amanda [name], CNMT     Rest Procedure:  Myocardial perfusion imaging was performed at rest 45 minutes following the intravenous administration of Technetium 68m Sestamibi. Rest ECG: NSR-RBBB  Stress Procedure:  The  patient received IV Lexiscan 0.4 mg over 15-seconds.  Technetium 58m Sestamibi injected at 30-seconds. This patient had sob, leg weakness, arm and leg pain, and tightness in the forehead with the lexiscan injection. Quantitative spect images were obtained after a 45 minute delay. Stress ECG: No significant change from baseline ECG  QPS Raw Data Images:  Patient motion noted. Stress Images:  Normal homogeneous uptake in all areas of the myocardium. Rest Images:  Normal homogeneous uptake in all areas of the myocardium. Subtraction (SDS):  Normal Transient Ischemic Dilatation (Normal <1.22):  0.99 Lung/Heart Ratio (Normal <0.45):  0.27  Quantitative Gated Spect Images QGS EDV:  70 ml QGS ESV:  17 ml  Impression Exercise Capacity:  Lexiscan with low level exercise. BP Response:  Normal blood pressure response. Clinical Symptoms:  Dyspnea, lightheaded and leg weakness aminophyline given ECG Impression:  No significant ST segment change suggestive of ischemia. Comparison with Prior Nuclear Study: No images to compare  Overall Impression:  Normal stress nuclear study.  LV Ejection Fraction: 76%.  LV Wall Motion:  NL LV Function; NL Wall Motion  Amanda [name]

## 2013-12-18 ENCOUNTER — Other Ambulatory Visit: Payer: Self-pay | Admitting: Cardiology

## 2013-12-18 ENCOUNTER — Encounter: Payer: Self-pay | Admitting: Cardiology

## 2013-12-18 DIAGNOSIS — I059 Rheumatic mitral valve disease, unspecified: Secondary | ICD-10-CM

## 2013-12-18 DIAGNOSIS — I341 Nonrheumatic mitral (valve) prolapse: Secondary | ICD-10-CM

## 2013-12-18 NOTE — H&P (Signed)
[address]  [place], [place] [address]  Phone: [phone]  Fax: [phone]  Date: 11/26/2013  ID: Amanda [name], DOB 02/16/1941, MRN [id number]  PCP: [name], MD  Cardiologist: Armanda Magic, MD  History of Present Illness:  Amanda [name] is a 73 y.o. female with a history of PAC's, MVP with mild MR and HTN who presents today for followup. She is doing well. She denies any chest pain, SOB, DOE, LE edema, palpitations or syncope. She did have a dizzy episode after working out in the yard the the extreme heat.  Wt Readings from Last 3 Encounters:   11/26/13  142 lb (64.411 kg)   07/24/10  140 lb (63.504 kg)   03/27/10  143 lb 12.8 oz (65.227 kg)    Past Medical History   Diagnosis  Date   .  Allergic rhinitis    .  Hyperparathyroidism    .  Seasonal allergies    .  DJD (degenerative joint disease)    .  MVP (mitral valve prolapse)      moderate posterior MVP with moderate MR and grade II diasotlic dysfunction   .  Hypothyroidism    .  Hyperlipidemia    .  Anemia    .  Small vessel disease, cerebrovascular    .  Chronic kidney disease      stage III   .  IBS (irritable bowel syndrome)    .  Glomerulonephritis      Dr [name]   .  PAC (premature atrial contraction)    .  Hypokalemia    .  Interstitial cystitis    .  Hypertension     Current Outpatient Prescriptions   Medication  Sig  Dispense  Refill   .  AMILORIDE-HYDROCHLOROTHIAZIDE PO  Take 5-50 mg by mouth daily.     [name]  aspirin 325 MG tablet  Take 325 mg by mouth daily.     .  calcitRIOL (ROCALTROL) 0.25 MCG capsule  Take 0.25 mcg by mouth every other day.     .  fluticasone (FLONASE) 50 MCG/ACT nasal spray  Place 2 sprays into both nostrils as needed for allergies or rhinitis.     .  hydrocortisone valerate ointment (WEST-CORT) 0.2 %  Apply 1 application topically 2 (two) times daily.     [name]  levothyroxine (SYNTHROID, LEVOTHROID) 75 MCG tablet  Take 75 mcg by mouth daily before breakfast.      .  loratadine (CLARITIN) 10 MG tablet  Take 10 mg by mouth daily as needed for allergies.     .  metroNIDAZOLE (METROGEL) 1 % gel  Apply 1 application topically daily.     .  Probiotic Product (PROBIOTIC DAILY PO)  Take 1 tablet by mouth daily.      No current facility-administered medications for this visit.   Allergies:  Allergies   Allergen  Reactions   .  Cephalexin  Rash   .  Ciprofloxacin  Rash   .  Erythromycin  Rash   .  Sulfonamide Derivatives  Rash   .  Tetracycline  Rash   Social History: The patient reports that she has quit smoking. She does not have any smokeless tobacco history on file. She reports that she does not drink alcohol or use illicit drugs.  Family History: The patient's family history includes Heart disease in her brother, father, and mother; Ovarian cancer in her sister. There is no history of Colon cancer.  ROS: Please see the history of present illness. All other systems reviewed and negative.  PHYSICAL EXAM:  VS: BP 114/64  Pulse 79  Ht 5' 7.5" (1.715 m)  Wt 142 lb (64.411 kg)  BMI 21.90 kg/m2  Well nourished, well developed, in no acute distress  HEENT: normal  Neck: no JVD  Cardiac: normal S1, S2; RRR;2/6 late systolic murmur at LLSB to apex  Lungs: clear to auscultation bilaterally, no wheezing, rhonchi or rales  Abd: soft, nontender, no hepatomegaly  Ext: no edema  Skin: warm and dry  Neuro: CNs 2-12 intact, no focal abnormalities noted  EKG: NSR with 1st degree AV block and RBBB  ASSESSMENT AND PLAN:  1. Moderate posterior MVP with moderate MR - echo is scheduled for next month to reevaluate 2. HTN well controlled - continue diuretic  - check BMET  3. PAC's - asymptomatic 4. New RBBB - Lexiscan myoveiw to rule out ischemia  Signed,  Ephram Kornegay, MD  11/26/2013 9:12 AM   

## 2013-12-18 NOTE — Telephone Encounter (Signed)
Pt seen Dr. [name] last week and she states BP is fine and he told pt she can go back on the amiloride/hctz, therefore she is taking medication.

## 2013-12-23 ENCOUNTER — Ambulatory Visit (INDEPENDENT_AMBULATORY_CARE_PROVIDER_SITE_OTHER): Payer: Medicare Other | Admitting: *Deleted

## 2013-12-23 ENCOUNTER — Encounter (HOSPITAL_COMMUNITY): Payer: Self-pay | Admitting: Pharmacy Technician

## 2013-12-23 DIAGNOSIS — I059 Rheumatic mitral valve disease, unspecified: Secondary | ICD-10-CM

## 2013-12-23 DIAGNOSIS — I341 Nonrheumatic mitral (valve) prolapse: Secondary | ICD-10-CM

## 2013-12-23 LAB — BASIC METABOLIC PANEL
BUN: 29 mg/dL — ABNORMAL HIGH (ref 6–23)
CO2: 27 mEq/L (ref 19–32)
Calcium: 10 mg/dL (ref 8.4–10.5)
Chloride: 104 mEq/L (ref 96–112)
Creatinine, Ser: 1.4 mg/dL — ABNORMAL HIGH (ref 0.4–1.2)
GFR: 38.68 mL/min — ABNORMAL LOW (ref >60.00)
GLUCOSE: 124 mg/dL — AB (ref 70–99)
Potassium: 4 mEq/L (ref 3.5–5.1)
SODIUM: 139 meq/L (ref 135–145)

## 2013-12-23 LAB — CBC WITH DIFFERENTIAL/PLATELET
Basophils Absolute: 0 10*3/uL (ref 0.0–0.1)
Basophils Relative: 0.5 % (ref 0.0–3.0)
Eosinophils Absolute: 0.3 10*3/uL (ref 0.0–0.7)
Eosinophils Relative: 3.4 % (ref 0.0–5.0)
HEMATOCRIT: 41.2 % (ref 36.0–46.0)
HEMOGLOBIN: 13.5 g/dL (ref 12.0–15.0)
Lymphocytes Relative: 27.4 % (ref 12.0–46.0)
Lymphs Abs: 2.5 10*3/uL (ref 0.7–4.0)
MCHC: 32.8 g/dL (ref 30.0–36.0)
MCV: 92.4 fl (ref 78.0–100.0)
MONOS PCT: 6.6 % (ref 3.0–12.0)
Monocytes Absolute: 0.6 10*3/uL (ref 0.1–1.0)
Neutro Abs: 5.7 10*3/uL (ref 1.4–7.7)
Neutrophils Relative %: 62.1 % (ref 43.0–77.0)
Platelets: 283 10*3/uL (ref 150.0–400.0)
RBC: 4.46 Mil/uL (ref 3.87–5.11)
RDW: 13.3 % (ref 11.5–15.5)
WBC: 9.1 10*3/uL (ref 4.0–10.5)

## 2013-12-23 LAB — PROTIME-INR
INR: 0.9 ratio (ref 0.8–1.0)
Prothrombin Time: 10 s (ref 9.6–13.1)

## 2013-12-28 ENCOUNTER — Encounter (HOSPITAL_COMMUNITY): Payer: Self-pay

## 2013-12-28 ENCOUNTER — Encounter (HOSPITAL_COMMUNITY)
Admission: RE | Disposition: A | Discharge status: Home / Self Care | Payer: Self-pay | Source: Ambulatory Visit | Attending: Cardiology

## 2013-12-28 ENCOUNTER — Ambulatory Visit (HOSPITAL_COMMUNITY)
Admission: RE | Admit: 2013-12-28 | Discharge: 2013-12-28 | Disposition: A | Discharge status: Home / Self Care | Payer: Medicare Other | Source: Ambulatory Visit | Attending: Cardiology | Admitting: Cardiology

## 2013-12-28 ENCOUNTER — Encounter: Payer: Self-pay | Admitting: General Surgery

## 2013-12-28 DIAGNOSIS — E785 Hyperlipidemia, unspecified: Secondary | ICD-10-CM | POA: Insufficient documentation

## 2013-12-28 DIAGNOSIS — I059 Rheumatic mitral valve disease, unspecified: Secondary | ICD-10-CM | POA: Insufficient documentation

## 2013-12-28 DIAGNOSIS — N183 Chronic kidney disease, stage 3 unspecified: Secondary | ICD-10-CM | POA: Insufficient documentation

## 2013-12-28 DIAGNOSIS — M1990 Unspecified osteoarthritis, unspecified site: Secondary | ICD-10-CM | POA: Insufficient documentation

## 2013-12-28 DIAGNOSIS — Z7982 Long term (current) use of aspirin: Secondary | ICD-10-CM | POA: Insufficient documentation

## 2013-12-28 DIAGNOSIS — Z538 Procedure and treatment not carried out for other reasons: Secondary | ICD-10-CM | POA: Insufficient documentation

## 2013-12-28 DIAGNOSIS — Z87891 Personal history of nicotine dependence: Secondary | ICD-10-CM | POA: Insufficient documentation

## 2013-12-28 DIAGNOSIS — E213 Hyperparathyroidism, unspecified: Secondary | ICD-10-CM | POA: Insufficient documentation

## 2013-12-28 DIAGNOSIS — I129 Hypertensive chronic kidney disease with stage 1 through stage 4 chronic kidney disease, or unspecified chronic kidney disease: Secondary | ICD-10-CM | POA: Insufficient documentation

## 2013-12-28 DIAGNOSIS — I491 Atrial premature depolarization: Secondary | ICD-10-CM | POA: Insufficient documentation

## 2013-12-28 DIAGNOSIS — I4510 Unspecified right bundle-branch block: Secondary | ICD-10-CM | POA: Insufficient documentation

## 2013-12-28 SURGERY — CANCELLED PROCEDURE

## 2013-12-28 MED ORDER — LIDOCAINE VISCOUS 2 % MT SOLN
OROMUCOSAL | Status: DC | PRN
  Administered 2013-12-28: 10 mL via OROMUCOSAL

## 2013-12-28 MED ORDER — SODIUM CHLORIDE 0.9 % IV SOLN
INTRAVENOUS | Status: DC
  Administered 2013-12-28: 500 mL via INTRAVENOUS

## 2013-12-28 MED ORDER — FENTANYL CITRATE 0.05 MG/ML IJ SOLN
INTRAMUSCULAR | Status: DC | PRN
  Administered 2013-12-28 (×2): 25 ug via INTRAVENOUS

## 2013-12-28 MED ORDER — FENTANYL CITRATE 0.05 MG/ML IJ SOLN
INTRAMUSCULAR | Status: AC
  Filled 2013-12-28: qty 2

## 2013-12-28 MED ORDER — MIDAZOLAM HCL 10 MG/2ML IJ SOLN
INTRAMUSCULAR | Status: DC | PRN
  Administered 2013-12-28 (×2): 2 mg via INTRAVENOUS

## 2013-12-28 MED ORDER — MIDAZOLAM HCL 5 MG/ML IJ SOLN
INTRAMUSCULAR | Status: AC
Start: 2013-12-28 — End: 2013-12-28
  Filled 2013-12-28: qty 1

## 2013-12-28 MED ORDER — LIDOCAINE VISCOUS 2 % MT SOLN
OROMUCOSAL | Status: AC
  Filled 2013-12-28: qty 15

## 2013-12-28 NOTE — Discharge Instructions (Signed)
Conscious Sedation, Adult, Care After °Refer to this sheet in the next few weeks. These instructions provide you with information on caring for yourself after your procedure. Your health care provider may also give you more specific instructions. Your treatment has been planned according to current medical practices, but problems sometimes occur. Call your health care provider if you have any problems or questions after your procedure. °WHAT TO EXPECT AFTER THE PROCEDURE  °After your procedure: °· You may feel sleepy, clumsy, and have poor balance for several hours. °· Vomiting may occur if you eat too soon after the procedure. °HOME CARE INSTRUCTIONS °· Do not participate in any activities where you could become injured for at least 24 hours. Do not: °¨ Drive. °¨ Swim. °¨ Ride a bicycle. °¨ Operate heavy machinery. °¨ Cook. °¨ Use power tools. °¨ Climb ladders. °¨ Work from a high place. °· Do not make important decisions or sign legal documents until you are improved. °· If you vomit, drink water, juice, or soup when you can drink without vomiting. Make sure you have little or no nausea before eating solid foods. °· Only take over-the-counter or prescription medicines for pain, discomfort, or fever as directed by your health care provider. °· Make sure you and your family fully understand everything about the medicines given to you, including what side effects may occur. °· You should not drink alcohol, take sleeping pills, or take medicines that cause drowsiness for at least 24 hours. °· If you smoke, do not smoke without supervision. °· If you are feeling better, you may resume normal activities 24 hours after you were sedated. °· Keep all appointments with your health care provider. °SEEK MEDICAL CARE IF: °· Your skin is pale or bluish in color. °· You continue to feel nauseous or vomit. °· Your pain is getting worse and is not helped by medicine. °· You have bleeding or swelling. °· You are still sleepy or  feeling clumsy after 24 hours. °SEEK IMMEDIATE MEDICAL CARE IF: °· You develop a rash. °· You have difficulty breathing. °· You develop any type of allergic problem. °· You have a fever. °MAKE SURE YOU: °· Understand these instructions. °· Will watch your condition. °· Will get help right away if you are not doing well or get worse. °Document Released: 04/15/2013 Document Reviewed: 04/15/2013 °Exit[employer]® Patient Information ©2015 [employer]. This information is not intended to replace advice given to you by your health care provider. Make sure you discuss any questions you have with your health care provider. °  °

## 2013-12-28 NOTE — Interval H&P Note (Signed)
History and Physical Interval Note:  12/28/2013 8:12 AM  Amanda [name]  has presented today for surgery, with the diagnosis of MITRAL VALVE PROLAPSE  The various methods of treatment have been discussed with the patient and family. After consideration of risks, benefits and other options for treatment, the patient has consented to  Procedure(s): TRANSESOPHAGEAL ECHOCARDIOGRAM (TEE) (N/A) as a surgical intervention .  The patient's history has been reviewed, patient examined, no change in status, stable for surgery.  I have reviewed the patient's chart and labs.  Questions were answered to the patient's satisfaction.     TURNER,TRACI R

## 2013-12-28 NOTE — Progress Notes (Signed)
Echocardiogram Echocardiogram Transesophageal has been performed.  Dorothey Baseman 12/28/2013, 12:14 PM

## 2013-12-28 NOTE — H&P (View-Only) (Signed)
[address]  [place], [place] [address]  Phone: [phone]  Fax: [phone]  Date: 11/26/2013  ID: Amanda [name], DOB 02/16/1941, MRN [id number]  PCP: [name], MD  Cardiologist: Armanda Magic, MD  History of Present Illness:  Amanda [name] is a 73 y.o. female with a history of PAC's, MVP with mild MR and HTN who presents today for followup. She is doing well. She denies any chest pain, SOB, DOE, LE edema, palpitations or syncope. She did have a dizzy episode after working out in the yard the the extreme heat.  Wt Readings from Last 3 Encounters:   11/26/13  142 lb (64.411 kg)   07/24/10  140 lb (63.504 kg)   03/27/10  143 lb 12.8 oz (65.227 kg)    Past Medical History   Diagnosis  Date   .  Allergic rhinitis    .  Hyperparathyroidism    .  Seasonal allergies    .  DJD (degenerative joint disease)    .  MVP (mitral valve prolapse)      moderate posterior MVP with moderate MR and grade II diasotlic dysfunction   .  Hypothyroidism    .  Hyperlipidemia    .  Anemia    .  Small vessel disease, cerebrovascular    .  Chronic kidney disease      stage III   .  IBS (irritable bowel syndrome)    .  Glomerulonephritis      Dr [name]   .  PAC (premature atrial contraction)    .  Hypokalemia    .  Interstitial cystitis    .  Hypertension     Current Outpatient Prescriptions   Medication  Sig  Dispense  Refill   .  AMILORIDE-HYDROCHLOROTHIAZIDE PO  Take 5-50 mg by mouth daily.     [name]  aspirin 325 MG tablet  Take 325 mg by mouth daily.     .  calcitRIOL (ROCALTROL) 0.25 MCG capsule  Take 0.25 mcg by mouth every other day.     .  fluticasone (FLONASE) 50 MCG/ACT nasal spray  Place 2 sprays into both nostrils as needed for allergies or rhinitis.     .  hydrocortisone valerate ointment (WEST-CORT) 0.2 %  Apply 1 application topically 2 (two) times daily.     [name]  levothyroxine (SYNTHROID, LEVOTHROID) 75 MCG tablet  Take 75 mcg by mouth daily before breakfast.      .  loratadine (CLARITIN) 10 MG tablet  Take 10 mg by mouth daily as needed for allergies.     .  metroNIDAZOLE (METROGEL) 1 % gel  Apply 1 application topically daily.     .  Probiotic Product (PROBIOTIC DAILY PO)  Take 1 tablet by mouth daily.      No current facility-administered medications for this visit.   Allergies:  Allergies   Allergen  Reactions   .  Cephalexin  Rash   .  Ciprofloxacin  Rash   .  Erythromycin  Rash   .  Sulfonamide Derivatives  Rash   .  Tetracycline  Rash   Social History: The patient reports that she has quit smoking. She does not have any smokeless tobacco history on file. She reports that she does not drink alcohol or use illicit drugs.  Family History: The patient's family history includes Heart disease in her brother, father, and mother; Ovarian cancer in her sister. There is no history of Colon cancer.  ROS: Please see the history of present illness. All other systems reviewed and negative.  PHYSICAL EXAM:  VS: BP 114/64  Pulse 79  Ht 5' 7.5" (1.715 m)  Wt 142 lb (64.411 kg)  BMI 21.90 kg/m2  Well nourished, well developed, in no acute distress  HEENT: normal  Neck: no JVD  Cardiac: normal S1, S2; RRR;2/6 late systolic murmur at LLSB to apex  Lungs: clear to auscultation bilaterally, no wheezing, rhonchi or rales  Abd: soft, nontender, no hepatomegaly  Ext: no edema  Skin: warm and dry  Neuro: CNs 2-12 intact, no focal abnormalities noted  EKG: NSR with 1st degree AV block and RBBB  ASSESSMENT AND PLAN:  1. Moderate posterior MVP with moderate MR - echo is scheduled for next month to reevaluate 2. HTN well controlled - continue diuretic  - check BMET  3. PAC's - asymptomatic 4. New RBBB - Lexiscan myoveiw to rule out ischemia  Signed,  Armanda Magicraci Amanda Martinec, MD  11/26/2013 9:12 AM

## 2013-12-28 NOTE — CV Procedure (Signed)
PROCEDURE NOTE  Procedure:  Transesophageal echocardiogram Operator:  Armanda Magic, MD Indications :  Mitral regurgitation with MVP Complications:unable to pass TEE probe IV Meds:Versed 4mg  IV, Fentanyl IV, Viscous Lidocaine 5cc PO  Results:After multiple attempts at attempting to pass TEE probe, attempts were unsuccessful.  Patient has a history of esophageal dilatation in 2011.  I will have patient see Dr. Juanda Chance for EGD evaluation prior to retrying TEE.

## 2014-01-19 ENCOUNTER — Ambulatory Visit (INDEPENDENT_AMBULATORY_CARE_PROVIDER_SITE_OTHER): Payer: Medicare Other | Admitting: Internal Medicine

## 2014-01-19 ENCOUNTER — Encounter: Payer: Self-pay | Admitting: Internal Medicine

## 2014-01-19 VITALS — BP 166/66 | HR 68 | Ht 66.75 in | Wt 144.4 lb

## 2014-01-19 DIAGNOSIS — R1319 Other dysphagia: Secondary | ICD-10-CM

## 2014-01-19 DIAGNOSIS — K222 Esophageal obstruction: Secondary | ICD-10-CM

## 2014-01-19 MED ORDER — ESOMEPRAZOLE MAGNESIUM 20 MG PO CPDR: 20.0000 mg | DELAYED_RELEASE_CAPSULE | Freq: Every day | ORAL | Status: DC

## 2014-01-19 NOTE — Patient Instructions (Signed)
You have been scheduled for an endoscopy. Please follow written instructions given to you at your visit today. If you use inhalers (even only as needed), please bring them with you on the day of your procedure. Your physician has requested that you go to www.startemmi.com and enter the access code given to you at your visit today. This web site gives a general overview about your procedure. However, you should still follow specific instructions given to you by our office regarding your preparation for the procedure.  We have sent the following medications to your pharmacy for you to pick up at your convenience: Nexium 20 mg daily  Please purchase the following medications over the counter and take as directed: Gavsicon 1-2 chews as needed for reflux  CC:Dr Selena Batten

## 2014-01-19 NOTE — Progress Notes (Signed)
Amanda [name] [birth year] [id number]  Note: This dictation was prepared with Dragon digital system. Any transcriptional errors that result from this procedure are unintentional.   History of Present Illness:  This is a 73 year old white female with a history of gastroesophageal reflux and esophageal stricture. She has developed recurrent dysphagia to solids such as meat and bread. She has been raw feeling  When food going down her esophagus. There has been no regurgitation. She has an occasional cough at night but denies hoarseness. Her last upper endoscopy in April 2011 revealed an esophageal stricture and a 3 cm hiatal hernia at 35 cm. She was dilated with 14, 15 and 16 mm dilators. An upper abdominal ultrasound was negative. There is a history of microscopic colitis in 2000 and 2006. There was no colitis seen on her last  2011 colonoscopy. She currently denies any diarrhea, rectal bleeding or abdominal pain.    Past Medical History  Diagnosis Date  . Allergic rhinitis   . Hyperparathyroidism   . Seasonal allergies   . DJD (degenerative joint disease)   . MVP (mitral valve prolapse)     moderate posterior MVP with moderate MR and grade II diasotlic dysfunction  . Hypothyroidism   . Hyperlipidemia   . Anemia   . Small vessel disease, cerebrovascular   . Chronic kidney disease     stage III  . IBS (irritable bowel syndrome)   . Glomerulonephritis     Dr [name]  . PAC (premature atrial contraction)   . Hypokalemia   . Interstitial cystitis   . Hypertension     Past Surgical History  Procedure Laterality Date  . Abdominal hysterectomy    . Breast biopsy Right     Allergies  Allergen Reactions  . Amlodipine     PEDAL EDEMA   . Contrast Media [Iodinated Diagnostic Agents] Hives    IVP dye per patient  . Pepcid [Famotidine] Swelling    "Throat Swelling"   . Prilosec [Omeprazole] Nausea Only  . Cephalexin Rash  . Ciprofloxacin Rash  . Erythromycin Rash  .  Sulfonamide Derivatives Rash  . Tetracycline Rash    Family history and social history have been reviewed.  Review of Systems: Solid food dysphagia  The remainder of the 10 point ROS is negative except as outlined in the H&P  Physical Exam: General Appearance Well developed, in no distress Eyes  Non icteric  HEENT  Non traumatic, normocephalic  Mouth No lesion, tongue papillated, no cheilosis Neck Supple without adenopathy, thyroid not enlarged, no carotid bruits, no JVD Lungs Clear to auscultation bilaterally COR Normal S1, normal S2, regular rhythm, no murmur, quiet precordium Abdomen mildly protuberant. Nontender. Normoactive bowel sounds. Liver edge at costal margin Rectal soft Hemoccult negative stool Extremities  No pedal edema Skin No lesions Neurological Alert and oriented x 3 Psychological Normal mood and affect  Assessment and Plan:   Problem #21 73 year old white female with a history of esophageal stricture and recurrent solid food dysphagia. We will schedule an upper endoscopy with dilatation. She will start Nexium 20 mg at bedtime. She was previously intolerant to H2 receptor antagonists. In addition to Nexium, she may take Gaviscon as needed for indigestion.  Problem #2 Microscopic colitis. She is currently in remission.    Lina Sar 01/19/2014

## 2014-02-03 ENCOUNTER — Encounter: Payer: Self-pay | Admitting: Internal Medicine

## 2014-02-03 ENCOUNTER — Ambulatory Visit (AMBULATORY_SURGERY_CENTER): Payer: Medicare Other | Admitting: Internal Medicine

## 2014-02-03 ENCOUNTER — Telehealth: Payer: Self-pay | Admitting: Cardiology

## 2014-02-03 VITALS — BP 137/110 | HR 64 | Temp 97.3°F | Resp 19 | Ht 66.75 in | Wt 144.0 lb

## 2014-02-03 DIAGNOSIS — R1319 Other dysphagia: Secondary | ICD-10-CM

## 2014-02-03 DIAGNOSIS — R1310 Dysphagia, unspecified: Secondary | ICD-10-CM

## 2014-02-03 MED ORDER — SODIUM CHLORIDE 0.9 % IV SOLN: 500.0000 mL | INTRAVENOUS | Status: DC

## 2014-02-03 NOTE — Telephone Encounter (Signed)
New message     Want Dr [name] to know that she had her esophagus stretched today by Dr Juanda Chance.  She had irr heartbeat after the procedure. She she be concerned?

## 2014-02-03 NOTE — Patient Instructions (Signed)
Discharge instructions given with verbal understanding. Handout on a dilatation diet. Resume previous medications. YOU HAD AN ENDOSCOPIC PROCEDURE TODAY AT THE [facility]: Refer to the procedure report that was given to you for any specific questions about what was found during the examination.  If the procedure report does not answer your questions, please call your gastroenterologist to clarify.  If you requested that your care partner not be given the details of your procedure findings, then the procedure report has been included in a sealed envelope for you to review at your convenience later.  YOU SHOULD EXPECT: Some feelings of bloating in the abdomen. Passage of more gas than usual.  Walking can help get rid of the air that was put into your GI tract during the procedure and reduce the bloating. If you had a lower endoscopy (such as a colonoscopy or flexible sigmoidoscopy) you may notice spotting of blood in your stool or on the toilet paper. If you underwent a bowel prep for your procedure, then you may not have a normal bowel movement for a few days.  DIET: Your first meal following the procedure should be a light meal and then it is ok to progress to your normal diet.  A half-sandwich or bowl of soup is an example of a good first meal.  Heavy or fried foods are harder to digest and may make you feel nauseous or bloated.  Likewise meals heavy in dairy and vegetables can cause extra gas to form and this can also increase the bloating.  Drink plenty of fluids but you should avoid alcoholic beverages for 24 hours.  ACTIVITY: Your care partner should take you home directly after the procedure.  You should plan to take it easy, moving slowly for the rest of the day.  You can resume normal activity the day after the procedure however you should NOT DRIVE or use heavy machinery for 24 hours (because of the sedation medicines used during the test).    SYMPTOMS TO REPORT IMMEDIATELY: A  gastroenterologist can be reached at any hour.  During normal business hours, 8:30 AM to 5:00 PM Monday through Friday, call [phone].  After hours and on weekends, please call the GI answering service at [phone] who will take a message and have the physician on call contact you.   Following upper endoscopy (EGD)  Vomiting of blood or coffee ground material  New chest pain or pain under the shoulder blades  Painful or persistently difficult swallowing  New shortness of breath  Fever of 100F or higher  Black, tarry-looking stools  FOLLOW UP: If any biopsies were taken you will be contacted by phone or by letter within the next 1-3 weeks.  Call your gastroenterologist if you have not heard about the biopsies in 3 weeks.  Our staff will call the home number listed on your records the next business day following your procedure to check on you and address any questions or concerns that you may have at that time regarding the information given to you following your procedure. This is a courtesy call and so if there is no answer at the home number and we have not heard from you through the emergency physician on call, we will assume that you have returned to your regular daily activities without incident.  SIGNATURES/CONFIDENTIALITY: You and/or your care partner have signed paperwork which will be entered into your electronic medical record.  These signatures attest to the fact that that the information above   on your After Visit Summary has been reviewed and is understood.  Full responsibility of the confidentiality of this discharge information lies with you and/or your care-partner. 

## 2014-02-03 NOTE — Op Note (Signed)
[facility] [address] [place], [address]   ENDOSCOPY PROCEDURE REPORT  PATIENT: Amanda [name], Amanda [name]  MR#: [id number] BIRTHDATE: Sep 17, 1940 , 72  yrs. old GENDER: Female ENDOSCOPIST: Hart Carwin, MD REFERRED BY:  [name], M.D. PROCEDURE DATE:  02/03/2014 PROCEDURE:  EGD, diagnostic and Savary dilation of esophagus ASA CLASS:     Class II INDICATIONS:  Dysphagia.   Heartburn.   History of gastroesophageal reflux.  Last endoscopy in April 2011.  Dilated with Savary dilators from 14-16 mm.  Currently on Nexium. MEDICATIONS: MAC sedation, administered by CRNA and propofol (Diprivan) 150mg  IV TOPICAL ANESTHETIC: none  DESCRIPTION OF PROCEDURE: After the risks benefits and alternatives of the procedure were thoroughly explained, informed consent was obtained.  The LB [id number] endoscope was introduced through the mouth and advanced to the second portion of the duodenum. Without limitations.  The instrument was slowly withdrawn as the mucosa was fully examined.      Esophagus: Proximal, mid  and distal esophageal mucosa appeared normal. There was no evidence of esophagitis. The squamocolumnar junction was regular and there was mild increase in fibrous tissue but no stricture. Endoscope traversed into the stomach without resistance. There was no significant hiatal hernia Stomach: the gastric folds were normal. Gastric antrum and pyloric outlet showed minimal erythema. Retroflexion of the endoscope revealed normal fundus and cardia Duodenum: duodenal bulb and descending duodenum was normal[ The scope was then withdrawn from the patient and the procedure completed. 16 mm Savary dilator passed all way died one into the stomach with the no significant resistance. There was no blood on the dilator  COMPLICATIONS: There were no complications. ENDOSCOPIC IMPRESSION:  essentially normal upper endoscopy of the esophagus stomach  and duodenum Passage of 16 mm Savary dilator Patient's dysphagia is likely related to dysmotility and due to esophageal reflux   RECOMMENDATIONS: 1.  Anti-reflux regimen to be follow 2.  continue Nexium 20 mg daily  REPEAT EXAM: no  eSigned:  Hart Carwin, MD 02/03/2014 11:00 AM   CC:  PATIENT NAME:  [name], [name] MR#: [id number]

## 2014-02-03 NOTE — Telephone Encounter (Signed)
Please call endo tomorrow and find out from nurse who took care of her what type of irregular heart beat she had

## 2014-02-03 NOTE — Progress Notes (Signed)
Report to PACU, RN, vss, BBS= Clear.  

## 2014-02-03 NOTE — Progress Notes (Signed)
Patient advise to contact her Cardiologist today concerning irregular heart beat.

## 2014-02-03 NOTE — Progress Notes (Signed)
Called to room to assist during endoscopic procedure.  Patient ID and intended procedure confirmed with present staff. Received instructions for my participation in the procedure from the performing physician.  

## 2014-02-03 NOTE — Telephone Encounter (Signed)
To Dr turner to advise. 

## 2014-02-04 ENCOUNTER — Telehealth: Payer: Self-pay | Admitting: *Deleted

## 2014-02-04 ENCOUNTER — Telehealth: Payer: Self-pay | Admitting: Cardiology

## 2014-02-04 ENCOUNTER — Telehealth: Payer: Self-pay | Admitting: Internal Medicine

## 2014-02-04 NOTE — Telephone Encounter (Signed)
Called and LVM for Nurse to call back in regards to pt

## 2014-02-04 NOTE — Telephone Encounter (Signed)
Tried calling pt. No answer on number and no VM

## 2014-02-04 NOTE — Telephone Encounter (Signed)
[address] [place], [place], [place] [address] [phone]

## 2014-02-04 NOTE — Telephone Encounter (Signed)
Spoke with nurse. She stated that they do no scan in monitor results, that showed the Irregular heart beat. They stated that they gave the monitor strip to pt to bring to our office and have [place] f/u they did not order a EKG either. They stated they do not scan monitor reports in.

## 2014-02-04 NOTE — Telephone Encounter (Signed)
Walk in pt Form " Sealed Envelope" gave to Danielle C 7.30.15/km

## 2014-02-04 NOTE — Telephone Encounter (Signed)
Please have patient drop off monitor strip to review

## 2014-02-04 NOTE — Telephone Encounter (Signed)
  Follow up Call-  Call back number 02/03/2014  Post procedure Call Back phone  # [phone]  Permission to leave phone message Yes     Patient questions:  Do you have a fever, pain , or abdominal swelling? No. Pain Score  0 *  Have you tolerated food without any problems? Yes.    Have you been able to return to your normal activities? Yes.    Do you have any questions about your discharge instructions: Diet   No. Medications  No. Follow up visit  No.  Do you have questions or concerns about your Care? No.  Actions: * If pain score is 4 or above: No action needed, pain <4.

## 2014-02-04 NOTE — Telephone Encounter (Signed)
On your desk to review.

## 2014-02-04 NOTE — Telephone Encounter (Signed)
I informed Amanda [name] at [facility] that Dr. Juanda Chance asked that an ECG  monitor strip documenting the patient's heart rhythm  noted while she was in the [facility]  be sent home with the patient to bring with her to see her cardiologist.  There was not an ECG ordered or performed in the [facility].  [name], RN also spoke with Amanda [name] today about taking the strip with her to see her cardiologist.

## 2014-02-04 NOTE — Telephone Encounter (Signed)
Pt is scheduled to see Dr Graciela Husbands at 3:00 PM. Monitor results brought over to pod by Dr [name]. TO Sherri to make aware.

## 2014-02-05 ENCOUNTER — Ambulatory Visit (INDEPENDENT_AMBULATORY_CARE_PROVIDER_SITE_OTHER): Payer: Medicare Other | Admitting: Internal Medicine

## 2014-02-05 ENCOUNTER — Encounter: Payer: Self-pay | Admitting: Internal Medicine

## 2014-02-05 VITALS — BP 185/74 | HR 60 | Ht 66.0 in | Wt 146.0 lb

## 2014-02-05 DIAGNOSIS — I459 Conduction disorder, unspecified: Secondary | ICD-10-CM

## 2014-02-05 NOTE — Patient Instructions (Signed)
Your physician recommends that you continue on your current medications as directed. Please refer to the Current Medication list given to you today.  Your physician has recommended that you wear an event monitor. Event monitors are medical devices that record the heart's electrical activity. Doctors most often [place] these monitors to diagnose arrhythmias. Arrhythmias are problems with the speed or rhythm of the heartbeat. The monitor is a small, portable device. You can wear one while you do your normal daily activities. This is usually used to diagnose what is causing palpitations/syncope (passing out).  Your physician has recommended that you wear an event monitor. Event monitors are medical devices that record the heart's electrical activity. Doctors most often [place] these monitors to diagnose arrhythmias. Arrhythmias are problems with the speed or rhythm of the heartbeat. The monitor is a small, portable device. You can wear one while you do your normal daily activities. This is usually used to diagnose what is causing palpitations/syncope (passing out).  Your physician recommends that you schedule a follow-up appointment in: 6 weeks with [name].

## 2014-02-05 NOTE — Progress Notes (Signed)
ELECTROPHYSIOLOGY CONSULT NOTE  Patient ID: Amanda Amanda, MRN: [id number], DOB/AGE: 75/11/1940 75 y.o. Admit date: (Not on file) Date of Consult: 02/05/2014  Primary Physician: Gweneth DimitriMCNEILL,WENDY, MD Primary Cardiologist: tt TT  Chief Complaint: HEART BLOCK   HPI Amanda Amanda Amanda is Amanda 73 y.o. female  Seeing as an add-on today because of concerns regarding heart block.  She has no history of syncope in the last 40 years. She does have some lightheadedness that occurs typically in the morning following breakfast and her Amanda.m. Medications.  He had some problems with exercise intolerance which he climbs the top of the hill he is short of breath. It abates.  Review of her echocardiogram does demonstrate second 2012 first degree AV block was noted with Amanda PR interval of about 240 ms. For recently recognized block was noted.  Cardiac evaluation included Amanda Myoview that was negative. An echocardiogram demonstrated significant abnormalities of mitral valveV  With posterior valve prolapse.. She is Amanda remote history of Amanda click murmur syndrome diagnosed by Dr. Corinda GublerLeBauer.  She knows that her blood pressures at home are in the 1:30 range.   Past Medical History  Diagnosis Date  . Allergic rhinitis   . Hyperparathyroidism   . Seasonal allergies   . DJD (degenerative joint disease)   . MVP (mitral valve prolapse)     moderate posterior MVP with moderate MR and grade II diasotlic dysfunction  . Hypothyroidism   . Hyperlipidemia   . Anemia   . Small vessel disease, cerebrovascular   . Chronic kidney disease     stage III  . IBS (irritable bowel syndrome)   . Glomerulonephritis     Dr Darrick Pennaeterding  . PAC (premature atrial contraction)   . Hypokalemia   . Interstitial cystitis   . Hypertension       Surgical History:  Past Surgical History  Procedure Laterality Date  . Abdominal hysterectomy    . Breast biopsy Right   . Tonsillectomy       Home Meds: Prior to Admission medications    Medication Sig Start Date End Date Taking? Authorizing Provider  amiloride-hydrochlorothiazide (MODURETIC) 5-50 MG tablet Take 1 tablet by mouth daily.   Yes Historical Provider, MD  aspirin 325 MG tablet Take 325 mg by mouth daily.   Yes Historical Provider, MD  calcitRIOL (ROCALTROL) 0.25 MCG capsule Take 0.25 mcg by mouth every 3 (three) days.    Yes Historical Provider, MD  esomeprazole (NEXIUM) 20 MG capsule Take 1 capsule (20 mg total) by mouth daily at 12 noon. 01/19/14  Yes Hart Carwinora M Brodie, MD  fluticasone [facility](FLONASE) 50 MCG/ACT nasal spray Place 2 sprays into both nostrils as needed for allergies or rhinitis.   Yes Historical Provider, MD  hydrocortisone valerate ointment (WEST-CORT) 0.2 % Apply 1 application topically daily as needed (dermatitis).    Yes Historical Provider, MD  levothyroxine (SYNTHROID, LEVOTHROID) 75 MCG tablet Take 75 mcg by mouth daily before breakfast.   Yes Historical Provider, MD  loratadine (CLARITIN) 10 MG tablet Take 10 mg by mouth daily as needed for allergies.   Yes Historical Provider, MD  OVER THE COUNTER MEDICATION Take 5 mLs by mouth daily as needed (reflux). Antacid Suspension   Yes Historical Provider, MD  Probiotic Product (PROBIOTIC DAILY PO) Take 1 tablet by mouth daily.   Yes Historical Provider, MD      Allergies:  Allergies  Allergen Reactions  . Amlodipine     PEDAL EDEMA   . Contrast  Media [Iodinated Diagnostic Agents] Hives    IVP dye per patient  . Pepcid [Famotidine] Swelling    "Throat Swelling"   . Prilosec [Omeprazole] Nausea Only  . Cephalexin Rash  . Ciprofloxacin Rash  . Erythromycin Rash  . Sulfonamide Derivatives Rash  . Tetracycline Rash    History   Social History  . Marital Status: Married    Spouse Name: N/Amanda    Number of Children: 2  . Years of Education: N/Amanda   Occupational History  . retired    Social History Main Topics  . Smoking status: Former Games developer  . Smokeless tobacco: Never Used  . Alcohol Use: No    . Drug Use: No  . Sexual Activity: Not on file   Other Topics Concern  . Not on file   Social History Narrative  . No narrative on file     Family History  Problem Relation Age of Onset  . Ovarian cancer Sister   . Colon cancer Neg Hx   . Heart disease Mother   . Heart disease Father   . Heart disease Brother   . Rheum arthritis Sister   . Melanoma Brother   . Brain cancer Brother      ROS:  Please see the history of present illness.     All other systems reviewed and negative.    Physical Exam: Blood pressure 185/74, pulse 60, height 5\' 6"  (1.676 m), weight 146 lb (66.225 kg). General: Well developed, well nourished female in no acute distress. Head: Normocephalic, atraumatic, sclera non-icteric, no xanthomas, nares are without discharge. EENT: normal Lymph Nodes:  none Back: without scoliosis/kyphosis, no CVA tendersness Neck: Negative for carotid bruits. JVD not elevated. Lungs: Clear bilaterally to auscultation without wheezes, rales, or rhonchi. Breathing is unlabored. Heart: Regular rhythm with occasional pauses with S1 S2 there is Amanda systolic squeaky murmur , rubs, or gallops appreciated. Abdomen: Soft, non-tender, non-distended with normoactive bowel sounds. No hepatomegaly. No rebound/guarding. No obvious abdominal masses. Msk:  Strength and tone appear normal for age. Extremities: No clubbing or cyanosis. No edema.  Distal pedal pulses are 2+ and equal bilaterally. Skin: Warm and Dry Neuro: Alert and oriented X 3. CN III-XII intact Grossly normal sensory and motor function . Psych:  Responds to questions appropriately with Amanda normal affect.      Labs: Cardiac Enzymes No results found for this basename: CKTOTAL, CKMB, TROPONINI,  in the last 72 hours CBC Lab Results  Component Value Date   WBC 9.1 12/23/2013   HGB 13.5 12/23/2013   HCT 41.2 12/23/2013   MCV 92.4 12/23/2013   PLT 283.0 12/23/2013   PROTIME: No results found for this basename: LABPROT, INR,   in the last 72 hours Chemistry No results found for this basename: NA, K, CL, CO2, BUN, CREATININE, CALCIUM, LABALBU, PROT, BILITOT, ALKPHOS, ALT, AST, GLUCOSE,  in the last 168 hours Lipids No results found for this basename: CHOL, HDL, LDLCALC, TRIG   BNP No results found for this basename: probnp   Miscellaneous No results found for this basename: DDIMER    Radiology/Studies:  No results found.  EKG:  Sinus rhythm with first degree AV block ranging from 300-400 ms evidence of Mobitz physiology and an episode of 2:1 heart block  Assessment and Plan: \  Second-degree AV block type I  First degree AV block/right bundle branch block  Mitral valve prolapse syndrome with moderate-severe mitral regurgitation  Orthostatic lightheadedness  Hypertension  Failed TEE  "Small vessel disease-cerebrovascular"  The patient is referred urgently because of concerns of heart block identified at endoscopy. The strips reveal first degree AV block, there is Mobitz physiology, and occasional 2-1 which is interpreted as being consistent with Mobitz physiology in this context, and wide complex beats with antecedent P waves.  She has no clearly attributable symptoms. She does have lightheadedness which is almost certainly orthostatic in nature and we'll address this by having her take her losartan evening.  She also notes exercise intolerance. This could be Amanda number of things. We'll undertake an event recorder to see if we can ascribe Amanda rhythm disturbance to this.  The absence of that, we will simply observe.  I stressed with her the importance of returning to [name] for repeat consideration of TEE to look at her abnormal mitral valve  with regurgitation  I've also suggested she decrease her aspirin dose from 325--160 and follow up with Dr. Cleda Daub regarding how low and she go     Sherryl Manges

## 2014-02-09 ENCOUNTER — Encounter: Payer: Self-pay | Admitting: *Deleted

## 2014-02-09 ENCOUNTER — Encounter (INDEPENDENT_AMBULATORY_CARE_PROVIDER_SITE_OTHER): Payer: Medicare Other

## 2014-02-09 DIAGNOSIS — I459 Conduction disorder, unspecified: Secondary | ICD-10-CM

## 2014-02-09 NOTE — Progress Notes (Signed)
Patient ID: Amanda [name], female   DOB: 07/01/1941, 73 y.o.   MRN: [id number] [employer] 30 day cardiac event monitor applied to patient.

## 2014-02-11 NOTE — Telephone Encounter (Signed)
Noted  

## 2014-03-10 ENCOUNTER — Ambulatory Visit: Payer: Medicare Other | Admitting: Cardiology

## 2014-04-14 ENCOUNTER — Telehealth: Payer: Self-pay | Admitting: *Deleted

## 2014-04-14 NOTE — Telephone Encounter (Signed)
Reviewed monitor results with patient - brief details. Advised her to re-schedule appt with [name] to review in full detail. Pt is agreeable and will call office to arrange office visit.

## 2014-05-21 ENCOUNTER — Encounter: Payer: Self-pay | Admitting: Cardiology

## 2014-05-21 ENCOUNTER — Ambulatory Visit (INDEPENDENT_AMBULATORY_CARE_PROVIDER_SITE_OTHER): Payer: Medicare Other | Admitting: Cardiology

## 2014-05-21 VITALS — BP 150/70 | HR 72

## 2014-05-21 DIAGNOSIS — I491 Atrial premature depolarization: Secondary | ICD-10-CM

## 2014-05-21 DIAGNOSIS — I341 Nonrheumatic mitral (valve) prolapse: Secondary | ICD-10-CM

## 2014-05-21 DIAGNOSIS — I340 Nonrheumatic mitral (valve) insufficiency: Secondary | ICD-10-CM

## 2014-05-21 DIAGNOSIS — I10 Essential (primary) hypertension: Secondary | ICD-10-CM

## 2014-05-21 DIAGNOSIS — I4510 Unspecified right bundle-branch block: Secondary | ICD-10-CM | POA: Insufficient documentation

## 2014-05-21 NOTE — Patient Instructions (Signed)
Your physician has requested that you have an echocardiogram. Echocardiography is a painless test that uses sound waves to create images of your heart. It provides your doctor with information about the size and shape of your heart and how well your heart's chambers and valves are working. This procedure takes approximately one hour. There are no restrictions for this procedure.  Your physician wants you to follow-up in: 6 months with Dr. Turner. You will receive a reminder letter in the mail two months in advance. If you don't receive a letter, please call our office to schedule the follow-up appointment.  

## 2014-05-21 NOTE — Progress Notes (Signed)
[address] [place], [place]  [id number] Phone: [phone] Fax:  [phone]  Date:  05/21/2014   ID:  Amanda ShoalsLoretta A Rotenberg, DOB 06/22/1941, MRN [id number]  PCP:  Gweneth DimitriMCNEILL,WENDY, MD  Cardiologist:  Armanda Magicraci Aubrianna Orchard, MD    History of Present Illness: Amanda [name] is a 73 y.o. female with a history of PAC's, MVP with moderate MR and HTN who presents today for followup. She was supposed to have a TEE for better evaluation of her MR but could not pass probe on TEE. She was referred back to Dr. Juanda ChanceBrodie and EGD was normal.  She was noted during the EGD to have Type I second degree AV block and wore a heart monitor which showed intermittent Type I second degree AV block.  She is doing well. She denies any chest pain, SOB, DOE, LE edema, palpitations, dizziness or syncope.   Wt Readings from Last 3 Encounters:  02/05/14 146 lb (66.225 kg)  02/03/14 144 lb (65.318 kg)  01/19/14 144 lb 6 oz (65.488 kg)     Past Medical History  Diagnosis Date  . Allergic rhinitis   . Hyperparathyroidism   . Seasonal allergies   . DJD (degenerative joint disease)   . MVP (mitral valve prolapse)     moderate posterior MVP with moderate MR and grade II diasotlic dysfunction  . Hypothyroidism   . Hyperlipidemia   . Anemia   . Small vessel disease, cerebrovascular   . Chronic kidney disease     stage III  . IBS (irritable bowel syndrome)   . Glomerulonephritis     Dr Darrick Pennaeterding  . PAC (premature atrial contraction)   . Hypokalemia   . Interstitial cystitis   . Hypertension     Current Outpatient Prescriptions  Medication Sig Dispense Refill  . amiloride-hydrochlorothiazide (MODURETIC) 5-50 MG tablet Take 1 tablet by mouth daily.    [name]. aspirin 325 MG tablet Take 325 mg by mouth daily.    . calcitRIOL (ROCALTROL) 0.25 MCG capsule Take 0.25 mcg by mouth every 3 (three) days.     . Cyanocobalamin (VITAMIN B-12) 1000 MCG SUBL Place under the tongue.    [name]. esomeprazole (NEXIUM) 20 MG capsule  Take 1 capsule (20 mg total) by mouth daily at 12 noon. 3 capsule 2  . fexofenadine (ALLEGRA) 180 MG tablet Take 180 mg by mouth daily.    . fluticasone (FLONASE) 50 MCG/ACT nasal spray Place 2 sprays into both nostrils as needed for allergies or rhinitis.    . hydrocortisone valerate ointment (WEST-CORT) 0.2 % Apply 1 application topically daily as needed (dermatitis).     [name]. levothyroxine (SYNTHROID, LEVOTHROID) 75 MCG tablet Take 75 mcg by mouth daily before breakfast.    . Misc Natural Products (TART CHERRY ADVANCED PO) Take by mouth.    . Probiotic Product (PROBIOTIC DAILY PO) Take 1 tablet by mouth daily.     No current facility-administered medications for this visit.    Allergies:    Allergies  Allergen Reactions  . Amlodipine     PEDAL EDEMA   . Contrast Media [Iodinated Diagnostic Agents] Hives    IVP dye per patient  . Pepcid [Famotidine] Swelling    "Throat Swelling"   . Prilosec [Omeprazole] Nausea Only  . Cephalexin Rash  . Ciprofloxacin Rash  . Erythromycin Rash  . Sulfonamide Derivatives Rash  . Tetracycline Rash    Social History:  The patient  reports that she has quit smoking. She has never used smokeless  tobacco. She reports that she does not drink alcohol or use illicit drugs.   Family History:  The patient's family history includes Brain cancer in her brother; Heart disease in her brother, father, and mother; Melanoma in her brother; Ovarian cancer in her sister; Rheum arthritis in her sister. There is no history of Colon cancer.   ROS:  Please see the history of present illness.      All other systems reviewed and negative.   PHYSICAL EXAM: VS:  BP 150/70 mmHg  Pulse 72  SpO2 99% Well nourished, well developed, in no acute distress HEENT: normal Neck: no JVD Cardiac:  normal S1, S2; RRR; 2/6 holosystolic murmur at LLSB to apex Lungs:  clear to auscultation bilaterally, no wheezing, rhonchi or rales Abd: soft, nontender, no hepatomegaly Ext: no  edema Skin: warm and dry Neuro:  CNs 2-12 intact, no focal abnormalities noted     ASSESSMENT AND PLAN:  1.  Moderate posterior MVP with moderate MR - TEE was unsuccessful due to inability to pass probe.  She does not want to try TEE again unless absolutely necessary.  Will repeat 2D echo to see if we can get a better look at the MV and reassess degree of MR 2.   HTN well controlled - continue diuretic 3. PAC's - asymptomatic 4.       RBBB - no ischemia on myoview  Followup with me in 1 year  Signed, Armanda Magic, MD [facility] 05/21/2014 9:28 AM

## 2014-05-31 ENCOUNTER — Other Ambulatory Visit: Payer: Self-pay

## 2014-05-31 DIAGNOSIS — Z1231 Encounter for screening mammogram for malignant neoplasm of breast: Secondary | ICD-10-CM

## 2014-06-09 ENCOUNTER — Ambulatory Visit (HOSPITAL_COMMUNITY): Payer: Medicare Other | Attending: Cardiology

## 2014-06-09 DIAGNOSIS — I10 Essential (primary) hypertension: Secondary | ICD-10-CM | POA: Diagnosis not present

## 2014-06-09 DIAGNOSIS — Z87891 Personal history of nicotine dependence: Secondary | ICD-10-CM | POA: Diagnosis not present

## 2014-06-09 DIAGNOSIS — E785 Hyperlipidemia, unspecified: Secondary | ICD-10-CM | POA: Diagnosis not present

## 2014-06-09 DIAGNOSIS — I341 Nonrheumatic mitral (valve) prolapse: Secondary | ICD-10-CM

## 2014-06-09 DIAGNOSIS — I340 Nonrheumatic mitral (valve) insufficiency: Secondary | ICD-10-CM

## 2014-06-09 NOTE — Progress Notes (Signed)
2D Echo completed. 06/09/2014 

## 2014-07-05 ENCOUNTER — Ambulatory Visit
Admission: RE | Admit: 2014-07-05 | Discharge: 2014-07-05 | Disposition: A | Discharge status: Home / Self Care | Payer: Medicare Other | Source: Ambulatory Visit

## 2014-07-05 ENCOUNTER — Encounter (INDEPENDENT_AMBULATORY_CARE_PROVIDER_SITE_OTHER): Payer: Self-pay

## 2014-07-05 DIAGNOSIS — Z1231 Encounter for screening mammogram for malignant neoplasm of breast: Secondary | ICD-10-CM

## 2014-08-30 ENCOUNTER — Encounter: Payer: Self-pay | Admitting: Cardiology

## 2014-11-29 ENCOUNTER — Ambulatory Visit (INDEPENDENT_AMBULATORY_CARE_PROVIDER_SITE_OTHER): Payer: Medicare Other | Admitting: Cardiology

## 2014-11-29 ENCOUNTER — Encounter: Payer: Self-pay | Admitting: Cardiology

## 2014-11-29 VITALS — BP 152/78 | HR 79 | Ht 66.0 in | Wt 136.2 lb

## 2014-11-29 DIAGNOSIS — I341 Nonrheumatic mitral (valve) prolapse: Secondary | ICD-10-CM | POA: Diagnosis not present

## 2014-11-29 DIAGNOSIS — I340 Nonrheumatic mitral (valve) insufficiency: Secondary | ICD-10-CM | POA: Diagnosis not present

## 2014-11-29 DIAGNOSIS — I4510 Unspecified right bundle-branch block: Secondary | ICD-10-CM

## 2014-11-29 DIAGNOSIS — I10 Essential (primary) hypertension: Secondary | ICD-10-CM

## 2014-11-29 DIAGNOSIS — I491 Atrial premature depolarization: Secondary | ICD-10-CM | POA: Diagnosis not present

## 2014-11-29 NOTE — Progress Notes (Signed)
Cardiology Office Note   Date:  11/29/2014   ID:  Amanda [name], DOB 1941-01-11, MRN [id number]  PCP:  [name], MD    Chief Complaint  Patient presents with  . Follow-up    Essential hypertension      History of Present Illness:  Amanda [name] is a 74 y.o. female with a history of PAC's, MVP with moderate MR, Type I second degree AV block and HTN who presents today for followup.  She is doing well. She denies any chest pain, SOB, DOE, LE edema, dizziness or syncope.  Once in a while she will notice a skipped heart beat.  She says that her energy level is good and she has been working in the yard and walking.     Past Medical History  Diagnosis Date  . Allergic rhinitis   . Hyperparathyroidism   . Seasonal allergies   . DJD (degenerative joint disease)   . MVP (mitral valve prolapse)     moderate posterior MVP with moderate MR and grade II diasotlic dysfunction  . Hypothyroidism   . Hyperlipidemia   . Anemia   . Small vessel disease, cerebrovascular   . Chronic kidney disease     stage III  . IBS (irritable bowel syndrome)   . Glomerulonephritis     Dr [name]  . PAC (premature atrial contraction)   . Hypokalemia   . Interstitial cystitis   . Hypertension     Past Surgical History  Procedure Laterality Date  . Abdominal hysterectomy    . Breast biopsy Right   . Tonsillectomy       Current Outpatient Prescriptions  Medication Sig Dispense Refill  . amiloride-hydrochlorothiazide (MODURETIC) 5-50 MG tablet Take 1 tablet by mouth daily.    [name] aspirin 325 MG tablet Take 325 mg by mouth daily.    . calcitRIOL (ROCALTROL) 0.25 MCG capsule Take 0.25 mcg by mouth every 3 (three) days.     . Cyanocobalamin (VITAMIN B-12) 1000 MCG SUBL Place under the tongue.    [name] esomeprazole (NEXIUM) 20 MG capsule Take 1 capsule (20 mg total) by mouth daily at 12 noon. 3 capsule 2  . fexofenadine (ALLEGRA) 180 MG tablet Take 180 mg by mouth daily.    .  fluticasone (FLONASE) 50 MCG/ACT nasal spray Place 2 sprays into both nostrils as needed for allergies or rhinitis.    . hydrocortisone valerate ointment (WEST-CORT) 0.2 % Apply 1 application topically daily as needed (dermatitis).     [name] levothyroxine (SYNTHROID, LEVOTHROID) 75 MCG tablet Take 75 mcg by mouth daily before breakfast.    . Misc Natural Products (TART CHERRY ADVANCED PO) Take by mouth.    . Probiotic Product (PROBIOTIC DAILY PO) Take 1 tablet by mouth daily.     No current facility-administered medications for this visit.    Allergies:   Amlodipine; Contrast media; Pepcid; Prilosec; Cephalexin; Ciprofloxacin; Erythromycin; Sulfonamide derivatives; and Tetracycline    Social History:  The patient  reports that she has quit smoking. She has never used smokeless tobacco. She reports that she does not drink alcohol or use illicit drugs.   Family History:  The patient's family history includes Brain cancer in her brother; Heart disease in her brother, father, and mother; Melanoma in her brother; Ovarian cancer in her sister; Rheum arthritis in her sister. There is no history of Colon cancer.    ROS:  Please see the history of present illness.   Otherwise, review of systems are positive  for none.   All other systems are reviewed and negative.    PHYSICAL EXAM: VS:  BP 152/78 mmHg  Pulse 79  Ht  (1.676 m)  Wt 136 lb 3.2 oz (61.78 kg)  BMI 21.99 kg/m2  SpO2 98% , BMI Body mass index is 21.99 kg/(m^2). GEN: Well nourished, well developed, in no acute distress HEENT: normal Neck: no JVD, carotid bruits, or masses Cardiac: RRR; no  rubs, or gallops,no edema. 2/6 late peaking systolic murmur at LLSB to apex Respiratory:  clear to auscultation bilaterally, normal work of breathing GI: soft, nontender, nondistended, + BS MS: no deformity or atrophy Skin: warm and dry, no rash Neuro:  Strength and sensation are intact Psych: euthymic mood, full affect   EKG:  EKG is not  ordered today.    Recent Labs: 12/23/2013: BUN 29*; Creatinine 1.4*; Hemoglobin 13.5; Platelets 283.0; Potassium 4.0; Sodium 139    Lipid Panel No results found for: CHOL, TRIG, HDL, CHOLHDL, VLDL, LDLCALC, LDLDIRECT    Wt Readings from Last 3 Encounters:  11/29/14 136 lb 3.2 oz (61.78 kg)  02/05/14 146 lb (66.225 kg)  02/03/14 144 lb (65.318 kg)      ASSESSMENT AND PLAN:  1. Moderate posterior MVP with moderate MR -  2. HTN borderline controlled - at home it runs 130/70mmHg - continue diuretic 3.  PAC's - asymptomatic 4.RBBB - no ischemia on myoview 5.  Type I second degree AV block - asymptomatic  Current medicines are reviewed at length with the patient today.  The patient does not have concerns regarding medicines.  The following changes have been made:  no change  Labs/ tests ordered today include: see above assessment and plan No orders of the defined types were placed in this encounter.     Disposition:   FU with me in 6 months   Signed, Quintella Reichert, MD  11/29/2014 8:48 AM    [facility] [address] [place], [place], [place]  [address] Phone: [phone]; Fax: [phone]

## 2014-11-29 NOTE — Patient Instructions (Signed)
Medication Instructions:  Your physician recommends that you continue on your current medications as directed. Please refer to the Current Medication list given to you today.   Labwork: None  Testing/Procedures: Your physician has requested that you have an echocardiogram in June, 2016. Echocardiography is a painless test that uses sound waves to create images of your heart. It provides your doctor with information about the size and shape of your heart and how well your heart's chambers and valves are working. This procedure takes approximately one hour. There are no restrictions for this procedure.  Follow-Up: Your physician wants you to follow-up in: 6 months with [name]. You will receive a reminder letter in the mail two months in advance. If you don't receive a letter, please call our office to schedule the follow-up appointment.   Any Other Special Instructions Will Be Listed Below (If Applicable).

## 2014-11-29 NOTE — Addendum Note (Signed)
Addended by: Gunnar Fusi A on: 11/29/2014 09:02 AM   Modules accepted: Orders

## 2014-12-16 ENCOUNTER — Ambulatory Visit (HOSPITAL_COMMUNITY): Payer: Medicare Other | Attending: Internal Medicine

## 2014-12-16 ENCOUNTER — Other Ambulatory Visit: Payer: Self-pay

## 2014-12-16 DIAGNOSIS — I340 Nonrheumatic mitral (valve) insufficiency: Secondary | ICD-10-CM | POA: Insufficient documentation

## 2014-12-16 DIAGNOSIS — E039 Hypothyroidism, unspecified: Secondary | ICD-10-CM | POA: Insufficient documentation

## 2014-12-16 DIAGNOSIS — I129 Hypertensive chronic kidney disease with stage 1 through stage 4 chronic kidney disease, or unspecified chronic kidney disease: Secondary | ICD-10-CM | POA: Insufficient documentation

## 2014-12-16 DIAGNOSIS — I341 Nonrheumatic mitral (valve) prolapse: Secondary | ICD-10-CM | POA: Diagnosis not present

## 2014-12-16 DIAGNOSIS — N189 Chronic kidney disease, unspecified: Secondary | ICD-10-CM | POA: Insufficient documentation

## 2014-12-16 DIAGNOSIS — E785 Hyperlipidemia, unspecified: Secondary | ICD-10-CM | POA: Diagnosis not present

## 2014-12-20 ENCOUNTER — Telehealth: Payer: Self-pay | Admitting: Cardiology

## 2014-12-20 DIAGNOSIS — I341 Nonrheumatic mitral (valve) prolapse: Secondary | ICD-10-CM

## 2014-12-20 DIAGNOSIS — I340 Nonrheumatic mitral (valve) insufficiency: Secondary | ICD-10-CM

## 2014-12-20 NOTE — Telephone Encounter (Signed)
Informed patient of results and verbal understanding expressed.  Repeat ECHO ordered for scheduling in one year. Patient agrees with treatment plan. 

## 2014-12-20 NOTE — Telephone Encounter (Signed)
New message    Please call patient about heart results

## 2014-12-20 NOTE — Telephone Encounter (Signed)
-----   Message from Quintella Reichert, MD sent at 12/16/2014  5:14 PM EDT ----- Please let patient know that echo showed normal LVF with mildly thickened heart muscle and mild to moderate MR - repeat echo in 1 year for MR

## 2014-12-27 ENCOUNTER — Telehealth: Payer: Self-pay | Admitting: Cardiology

## 2014-12-27 NOTE — Telephone Encounter (Signed)
New Message ° ° ° ° ° °Calling wanting to know if we received the Attestation faxed on 12/09/14. Please call back and advise. °

## 2014-12-29 NOTE — Telephone Encounter (Signed)
Informed representative that attestation has not been submitted. They were sending to incorrect fax number. New copy of attestation to be sent to [phone] Attn: Orpha Bur Will have [name] sign when she returns to the office.

## 2015-01-06 ENCOUNTER — Inpatient Hospital Stay (HOSPITAL_COMMUNITY)
Admission: EM | Admit: 2015-01-06 | Discharge: 2015-01-08 | DRG: 243 | Disposition: A | Discharge status: Home / Self Care | Payer: Medicare Other | Attending: Cardiology | Admitting: Cardiology

## 2015-01-06 ENCOUNTER — Encounter (HOSPITAL_COMMUNITY): Payer: Self-pay | Admitting: Emergency Medicine

## 2015-01-06 ENCOUNTER — Emergency Department (HOSPITAL_COMMUNITY): Payer: Medicare Other

## 2015-01-06 DIAGNOSIS — K589 Irritable bowel syndrome without diarrhea: Secondary | ICD-10-CM | POA: Diagnosis present

## 2015-01-06 DIAGNOSIS — I340 Nonrheumatic mitral (valve) insufficiency: Secondary | ICD-10-CM | POA: Diagnosis present

## 2015-01-06 DIAGNOSIS — I4430 Unspecified atrioventricular block: Secondary | ICD-10-CM | POA: Diagnosis not present

## 2015-01-06 DIAGNOSIS — I441 Atrioventricular block, second degree: Secondary | ICD-10-CM | POA: Diagnosis present

## 2015-01-06 DIAGNOSIS — R001 Bradycardia, unspecified: Secondary | ICD-10-CM | POA: Diagnosis present

## 2015-01-06 DIAGNOSIS — Z8249 Family history of ischemic heart disease and other diseases of the circulatory system: Secondary | ICD-10-CM

## 2015-01-06 DIAGNOSIS — I10 Essential (primary) hypertension: Secondary | ICD-10-CM | POA: Diagnosis not present

## 2015-01-06 DIAGNOSIS — N189 Chronic kidney disease, unspecified: Secondary | ICD-10-CM | POA: Diagnosis present

## 2015-01-06 DIAGNOSIS — I341 Nonrheumatic mitral (valve) prolapse: Secondary | ICD-10-CM | POA: Diagnosis present

## 2015-01-06 DIAGNOSIS — E876 Hypokalemia: Secondary | ICD-10-CM | POA: Diagnosis present

## 2015-01-06 DIAGNOSIS — Z882 Allergy status to sulfonamides status: Secondary | ICD-10-CM | POA: Diagnosis not present

## 2015-01-06 DIAGNOSIS — I129 Hypertensive chronic kidney disease with stage 1 through stage 4 chronic kidney disease, or unspecified chronic kidney disease: Secondary | ICD-10-CM | POA: Diagnosis present

## 2015-01-06 DIAGNOSIS — Z87891 Personal history of nicotine dependence: Secondary | ICD-10-CM

## 2015-01-06 DIAGNOSIS — I442 Atrioventricular block, complete: Principal | ICD-10-CM | POA: Diagnosis present

## 2015-01-06 DIAGNOSIS — R0602 Shortness of breath: Secondary | ICD-10-CM | POA: Diagnosis present

## 2015-01-06 DIAGNOSIS — N179 Acute kidney failure, unspecified: Secondary | ICD-10-CM

## 2015-01-06 DIAGNOSIS — Z888 Allergy status to other drugs, medicaments and biological substances status: Secondary | ICD-10-CM | POA: Diagnosis not present

## 2015-01-06 DIAGNOSIS — R0789 Other chest pain: Secondary | ICD-10-CM | POA: Diagnosis not present

## 2015-01-06 DIAGNOSIS — Y92230 Patient room in hospital as the place of occurrence of the external cause: Secondary | ICD-10-CM

## 2015-01-06 DIAGNOSIS — E039 Hypothyroidism, unspecified: Secondary | ICD-10-CM | POA: Diagnosis present

## 2015-01-06 DIAGNOSIS — Z79899 Other long term (current) drug therapy: Secondary | ICD-10-CM

## 2015-01-06 DIAGNOSIS — Z881 Allergy status to other antibiotic agents status: Secondary | ICD-10-CM | POA: Diagnosis not present

## 2015-01-06 DIAGNOSIS — I491 Atrial premature depolarization: Secondary | ICD-10-CM | POA: Diagnosis present

## 2015-01-06 DIAGNOSIS — Z8261 Family history of arthritis: Secondary | ICD-10-CM | POA: Diagnosis not present

## 2015-01-06 DIAGNOSIS — I4510 Unspecified right bundle-branch block: Secondary | ICD-10-CM | POA: Diagnosis present

## 2015-01-06 DIAGNOSIS — E785 Hyperlipidemia, unspecified: Secondary | ICD-10-CM | POA: Diagnosis present

## 2015-01-06 DIAGNOSIS — L7622 Postprocedural hemorrhage and hematoma of skin and subcutaneous tissue following other procedure: Secondary | ICD-10-CM | POA: Diagnosis not present

## 2015-01-06 DIAGNOSIS — Z7982 Long term (current) use of aspirin: Secondary | ICD-10-CM

## 2015-01-06 DIAGNOSIS — Y838 Other surgical procedures as the cause of abnormal reaction of the patient, or of later complication, without mention of misadventure at the time of the procedure: Secondary | ICD-10-CM | POA: Diagnosis not present

## 2015-01-06 DIAGNOSIS — I452 Bifascicular block: Secondary | ICD-10-CM | POA: Diagnosis present

## 2015-01-06 DIAGNOSIS — Z950 Presence of cardiac pacemaker: Secondary | ICD-10-CM

## 2015-01-06 DIAGNOSIS — Z91041 Radiographic dye allergy status: Secondary | ICD-10-CM | POA: Diagnosis not present

## 2015-01-06 LAB — I-STAT CHEM 8, ED
BUN: 37 mg/dL — AB (ref 6–20)
Calcium, Ion: 1.19 mmol/L (ref 1.13–1.30)
Chloride: 103 mmol/L (ref 101–111)
Creatinine, Ser: 1.8 mg/dL — ABNORMAL HIGH (ref 0.44–1.00)
Glucose, Bld: 92 mg/dL (ref 65–99)
HCT: 41 % (ref 36.0–46.0)
Hemoglobin: 13.9 g/dL (ref 12.0–15.0)
Potassium: 4.4 mmol/L (ref 3.5–5.1)
SODIUM: 138 mmol/L (ref 135–145)
TCO2: 23 mmol/L (ref 0–100)

## 2015-01-06 LAB — I-STAT TROPONIN, ED: TROPONIN I, POC: 0 ng/mL (ref 0.00–0.08)

## 2015-01-06 LAB — BASIC METABOLIC PANEL
ANION GAP: 11 (ref 5–15)
BUN: 37 mg/dL — ABNORMAL HIGH (ref 6–20)
CALCIUM: 9.5 mg/dL (ref 8.9–10.3)
CO2: 22 mmol/L (ref 22–32)
CREATININE: 1.79 mg/dL — AB (ref 0.44–1.00)
Chloride: 105 mmol/L (ref 101–111)
GFR calc Af Amer: 31 mL/min — ABNORMAL LOW (ref >60)
GFR, EST NON AFRICAN AMERICAN: 27 mL/min — AB (ref >60)
Glucose, Bld: 96 mg/dL (ref 65–99)
POTASSIUM: 4.5 mmol/L (ref 3.5–5.1)
SODIUM: 138 mmol/L (ref 135–145)

## 2015-01-06 LAB — CBC
HCT: 37.3 % (ref 36.0–46.0)
HEMOGLOBIN: 12.5 g/dL (ref 12.0–15.0)
MCH: 30.2 pg (ref 26.0–34.0)
MCHC: 33.5 g/dL (ref 30.0–36.0)
MCV: 90.1 fL (ref 78.0–100.0)
Platelets: 208 10*3/uL (ref 150–400)
RBC: 4.14 MIL/uL (ref 3.87–5.11)
RDW: 12.9 % (ref 11.5–15.5)
WBC: 9.9 10*3/uL (ref 4.0–10.5)

## 2015-01-06 LAB — BRAIN NATRIURETIC PEPTIDE: B NATRIURETIC PEPTIDE 5: 639 pg/mL — AB (ref 0.0–100.0)

## 2015-01-06 MED ORDER — LEVOTHYROXINE SODIUM 75 MCG PO TABS
75.0000 ug | ORAL_TABLET | Freq: Every day | ORAL | Status: DC
  Administered 2015-01-08: 75 ug via ORAL
  Filled 2015-01-06 (×3): qty 1

## 2015-01-06 MED ORDER — CALCITRIOL 0.25 MCG PO CAPS
0.2500 ug | ORAL_CAPSULE | ORAL | Status: DC
  Administered 2015-01-08: 0.25 ug via ORAL
  Filled 2015-01-06: qty 1

## 2015-01-06 MED ORDER — PANTOPRAZOLE SODIUM 40 MG PO TBEC
80.0000 mg | DELAYED_RELEASE_TABLET | Freq: Every day | ORAL | Status: DC
  Administered 2015-01-07 – 2015-01-08 (×2): 80 mg via ORAL
  Filled 2015-01-06 (×2): qty 2

## 2015-01-06 MED ORDER — FLUTICASONE PROPIONATE 50 MCG/ACT NA SUSP: 2.0000 | NASAL | Status: DC | PRN

## 2015-01-06 MED ORDER — ASPIRIN 325 MG PO TABS
325.0000 mg | ORAL_TABLET | Freq: Every day | ORAL | Status: DC
  Administered 2015-01-07 – 2015-01-08 (×2): 325 mg via ORAL
  Filled 2015-01-06 (×3): qty 1

## 2015-01-06 MED ORDER — ACETAMINOPHEN 325 MG PO TABS
650.0000 mg | ORAL_TABLET | ORAL | Status: DC | PRN
  Administered 2015-01-08: 650 mg via ORAL
  Filled 2015-01-06 (×2): qty 2

## 2015-01-06 MED ORDER — SODIUM CHLORIDE 0.9 % IV SOLN
Freq: Once | INTRAVENOUS | Status: AC
  Administered 2015-01-06: 22:00:00 via INTRAVENOUS

## 2015-01-06 MED ORDER — HYDRALAZINE HCL 20 MG/ML IJ SOLN
10.0000 mg | Freq: Four times a day (QID) | INTRAMUSCULAR | Status: DC | PRN
  Administered 2015-01-06: 10 mg via INTRAVENOUS
  Filled 2015-01-06: qty 1

## 2015-01-06 MED ORDER — ONDANSETRON HCL 4 MG/2ML IJ SOLN
4.0000 mg | Freq: Four times a day (QID) | INTRAMUSCULAR | Status: DC | PRN
  Administered 2015-01-07: 4 mg via INTRAVENOUS
  Filled 2015-01-06: qty 2

## 2015-01-06 MED ORDER — TRIAMTERENE-HCTZ 75-50 MG PO TABS
1.0000 | ORAL_TABLET | Freq: Every day | ORAL | Status: DC
Start: 2015-01-07 — End: 2015-01-06

## 2015-01-06 NOTE — ED Notes (Addendum)
Provider bedside, RN requested should zole pads be placed, he instructed no.

## 2015-01-06 NOTE — H&P (Signed)
Patient ID: CLOVIS WARWICK MRN: [id number], DOB/AGE: 10-21-1940   Admit date: 01/06/2015   Primary Physician: [name], MD Primary Cardiologist: Armanda Magic, MD  Pt. Profile:  74F with a history of hypothyroidism on synthroid, PAC's, MVP with moderate MR, Type I second degree AV block, and HTN, who presents with advanced symptomatic AV block.   Problem List  Past Medical History  Diagnosis Date  . Allergic rhinitis   . Hyperparathyroidism   . Seasonal allergies   . DJD (degenerative joint disease)   . MVP (mitral valve prolapse)     moderate posterior MVP with moderate MR and grade II diasotlic dysfunction  . Hypothyroidism   . Hyperlipidemia   . Anemia   . Small vessel disease, cerebrovascular   . Chronic kidney disease     stage III  . IBS (irritable bowel syndrome)   . Glomerulonephritis     Dr [name]  . PAC (premature atrial contraction)   . Hypokalemia   . Interstitial cystitis   . Hypertension     Past Surgical History  Procedure Laterality Date  . Abdominal hysterectomy    . Breast biopsy Right   . Tonsillectomy       Allergies  Allergies  Allergen Reactions  . Amlodipine     PEDAL EDEMA   . Contrast Media [Iodinated Diagnostic Agents] Hives    IVP dye per patient  . Pepcid [Famotidine] Swelling    "Throat Swelling"   . Prilosec [Omeprazole] Nausea Only  . Cephalexin Rash  . Ciprofloxacin Rash  . Erythromycin Rash  . Sulfonamide Derivatives Rash  . Tetracycline Rash    HPI  74F with a history of hypothyroidism on synthroid, PAC's, MVP with moderate MR, Type I second degree AV block, and HTN, who presents with advanced symptomatic AV block.   Ms. Mahajan frequently monitors her BP at home. SHe states that 3 days ago, she noticed her SBP to be in the 170s and her HR to be 32bpm. Her typical SBP is 120s-130s and her usual pulse is in the 80s. She found the same results yesterday and again today. Today, she had a neighbor double  check and again she was found to have a HR in the 30s and SBP in 170s. She states she has generally felt "really good" except some mild DOE and a sense that her "equlibrium feels off." No LH, dizziness, syncope, or presyncope. She has generally remained active despite these exertional symptoms.   She was seen to day at [facility]. Bradycardia was again documented and she was sent to the ER.   On arrival to the ER, she was bradycardic (37bpm), hypertensive (194/50), saturating 100% on RA. ECG demonstrated 2:1 AVB with ventricular rate of 38bpm. QRS has LBBB morphology. Labs were notable for K 4.4, Cr 1.8 (baseline 1.4), BNP 639.0, POC TnI 0.00, WBC 9.9. CXR demonstrated vascular congestion with mild edema; no consolidation.   Of note, she does not take any nodal agents. Her TFTs are regularly checked, last in May. No recent synthroid dose changes. She goes out into the woods but has not had a tick bite. She had a TTE on 12/16/14 that demonstrated mild LVH, LVEF 60-65%, and mild to moderate MR in setting of moderate posterior prolapse. Ms. Roskelley reported that one week ago, she had a 15 minute episode of exertional chest tightness while gardening that resolved with rest. She has been active since and has not had a recurrence of pain.  Home Medications  Prior to Admission medications   Medication Sig Start Date End Date Taking? Authorizing Provider  amiloride-hydrochlorothiazide (MODURETIC) 5-50 MG tablet Take 1 tablet by mouth daily.    Historical Provider, MD  aspirin 325 MG tablet Take 325 mg by mouth daily.    Historical Provider, MD  calcitRIOL (ROCALTROL) 0.25 MCG capsule Take 0.25 mcg by mouth every 3 (three) days.     Historical Provider, MD  Cyanocobalamin (VITAMIN B-12) 1000 MCG SUBL Place under the tongue.    Historical Provider, MD  esomeprazole (NEXIUM) 20 MG capsule Take 1 capsule (20 mg total) by mouth daily at 12 noon. 01/19/14   Hart Carwin, MD  fexofenadine (ALLEGRA)  180 MG tablet Take 180 mg by mouth daily.    Historical Provider, MD  fluticasone (FLONASE) 50 MCG/ACT nasal spray Place 2 sprays into both nostrils as needed for allergies or rhinitis.    Historical Provider, MD  hydrocortisone valerate ointment (WEST-CORT) 0.2 % Apply 1 application topically daily as needed (dermatitis).     Historical Provider, MD  levothyroxine (SYNTHROID, LEVOTHROID) 75 MCG tablet Take 75 mcg by mouth daily before breakfast.    Historical Provider, MD  Misc Natural Products (TART CHERRY ADVANCED PO) Take by mouth.    Historical Provider, MD  Probiotic Product (PROBIOTIC DAILY PO) Take 1 tablet by mouth daily.    Historical Provider, MD    Family History  Family History  Problem Relation Age of Onset  . Ovarian cancer Sister   . Colon cancer Neg Hx   . Heart disease Mother   . Heart disease Father   . Heart disease Brother   . Rheum arthritis Sister   . Melanoma Brother   . Brain cancer Brother     Social History  History   Social History  . Marital Status: Married    Spouse Name: N/A  . Number of Children: 2  . Years of Education: N/A   Occupational History  . retired    Social History Main Topics  . Smoking status: Former Games developer  . Smokeless tobacco: Never Used  . Alcohol Use: No  . Drug Use: No  . Sexual Activity: Not on file   Other Topics Concern  . Not on file   Social History Narrative     Review of Systems General:  No chills, fever, night sweats or weight changes.  Cardiovascular:  See HPI Dermatological: No rash, lesions/masses Respiratory: No cough, dyspnea Urologic: No hematuria, dysuria Abdominal:   No nausea, vomiting, diarrhea, bright red blood per rectum, melena, or hematemesis Neurologic:  No visual changes, wkns, changes in mental status. All other systems reviewed and are otherwise negative except as noted above.  Physical Exam  Blood pressure 149/71, pulse 37, temperature 97.8 F (36.6 C), temperature source Oral,  resp. rate 15, height 5' 7.5" (1.715 m), weight 63.504 kg (140 lb), SpO2 100 %.  General: Pleasant, NAD Psych: Normal affect. Neuro: Alert and oriented X 3. Moves all extremities spontaneously. HEENT: Normal  Neck: Supple without bruits or JVD. + cannon a wave Lungs:  Resp regular and unlabored, CTA. Heart: Regular, bradycardic to 30s. Mid systolic click. 2/6 systolic murmur at base.  Abdomen: Soft, non-tender, non-distended, BS + x 4.  Extremities: No clubbing, cyanosis or edema. DP/PT/Radials 2+ and equal bilaterally.  Labs  Troponin [facility] Test)  Recent Labs  01/06/15 2026  TROPIPOC 0.00   No results for input(s): CKTOTAL, CKMB, TROPONINI in the last 72 hours.  Lab Results  Component Value Date   WBC 9.9 01/06/2015   HGB 13.9 01/06/2015   HCT 41.0 01/06/2015   MCV 90.1 01/06/2015   PLT 208 01/06/2015    Recent Labs Lab 01/06/15 2021 01/06/15 2047  NA 138 138  K 4.5 4.4  CL 105 103  CO2 22  --   BUN 37* 37*  CREATININE 1.79* 1.80*  CALCIUM 9.5  --   GLUCOSE 96 92   No results found for: CHOL, HDL, LDLCALC, TRIG No results found for: DDIMER   Radiology/Studies  No results found.  12/16/14 TTE:  - Left ventricle: The cavity size was normal. Wall thickness was increased in a pattern of mild LVH. Systolic function was normal. The estimated ejection fraction was in the range of 60% to 65%. Left ventricular diastolic function parameters were normal. - Mitral valve: Calcified annulus. There was mild to moderate regurgitation.   ECG  2:1 AVB with ventricular rate of 38bpm. QRS has LBBB morphology. Previous ECG on 02/05/14 demonstrated Mobitz I with iRBBB QRS complex  ASSESSMENT AND PLAN  12F with a history of hypothyroidism on synthroid, PAC's, MVP with moderate MR, Type I second degree AV block, and HTN, who presents with advanced symptomatic AV block.   AV block. She has a history of Mobitz I block. Telemetry demonstrates evidence of more  advanced AV block with occasional periods with 2 consecutive non-conducted P waves. Her underlying escape complexes are of LBBB type morphology, which is different than her previously noted iRBBB QRS complexes. This is all suggestive of infranodal conduction system disease. This is most likely progressive conduction system disease in the setting of aging. Will rule out abnormal thyroid function as secondary cause. Although an ischemic evaluation could be considered due to exertional CP a week ago, CAD is unlikely to be the cause of AV block in this setting as she is currently demonstrated block without any other suggestions of ischemia. Suspect elevated BNP related to arrhythmia.   -TSH -K>4, Mg>2 -UA -EP consult in AM for dual chamber pacemaker. No LV lead required given preserved LV function.  -NPO at MN for probable PPM for symptomatic advanced AV block  Chest tightness. She has a FH of CAD. Symptoms were typical but not reproduced with more recent exertion.  - consider nuclear stress test at some point, potentially even as outpatient.  - A1c - lipids  HTN - hold diuretic - Hydralazine 10mg  q6h PRN SBP>170  AoCKD. Cr mildly increased from 1.4 to 1.8. Suspect related to arrhythmia. Given elevated BNP and arrhythmia, will not give IVF at this point, but will hold diuretic.  - would consider gentle maintenance fluids tomorrow if she will be NPO for a prolonged period of time.   Neva Seat, MD 01/06/2015, 9:14 PM

## 2015-01-06 NOTE — ED Notes (Addendum)
EMS reports from home c/o of SOB and hypertension.  Pt reports that she has experienced SOB for three weeks but BP increased to the 170's.  Reports "equiblium is more off".  Extensive family history of cardiac problems.  Denies chest pain

## 2015-01-06 NOTE — ED Provider Notes (Addendum)
  Face-to-face evaluation   History: She complains of gradual onset of shortness of breath with exertion for 2 weeks, worse in the last 3 days. Her equilibrium is off. No chest pain, nausea, vomiting, or back pain.  Physical exam: Alert, elderly female in minimal discomfort. Cardiac monitor during examination is 45, 50 sinus. She moves all extremities equally. There is no facial asymmetry. There is no respiratory distress.  Medical screening examination/treatment/procedure(s) were conducted as a shared visit with non-physician practitioner(s) and myself.  I personally evaluated the patient during the encounter    Mancel Bale, MD 01/08/15 [phone]

## 2015-01-06 NOTE — ED Provider Notes (Signed)
CSN: [id number]     Arrival date & time 01/06/15  2000 History   First MD Initiated Contact with Patient 01/06/15 2019     Chief Complaint  Patient presents with  . Shortness of Breath     (Consider location/radiation/quality/duration/timing/severity/associated sxs/prior Treatment) HPI Comments: Pleasant 74 year old female with a history of stage III kidney disease, hypertension, GERD, hyperthyroidism who presents with 3 days of shortness of breath with exertion and feeling "off balance while walking.  She's noted that her pulse rate has been low.  She went to [facility] who made arrangements for her to be transported to the hospital for evaluation due to heart rate in the 30s. She denies any change in medications, change in lifestyle change in weight, change in activity level.  Denies any recent travel where she sat for a prolonged period of time.  She denies chest pain or swelling in her lower extremities  Patient is a 74 y.o. female presenting with shortness of breath. The history is provided by the patient.  Shortness of Breath Severity:  Moderate Onset quality:  Gradual Duration:  3 days Timing:  Constant Progression:  Worsening Chronicity:  New Relieved by:  None tried Worsened by:  Nothing tried Ineffective treatments:  None tried Associated symptoms: no chest pain, no cough, no fever, no headaches, no syncope and no wheezing   Risk factors: no hx of cancer, no hx of PE/DVT, no obesity, no prolonged immobilization and no recent surgery     Past Medical History  Diagnosis Date  . Allergic rhinitis   . Hyperparathyroidism   . Seasonal allergies   . DJD (degenerative joint disease)   . MVP (mitral valve prolapse)     moderate posterior MVP with moderate MR and grade II diasotlic dysfunction  . Hypothyroidism   . Hyperlipidemia   . Anemia   . Small vessel disease, cerebrovascular   . Chronic kidney disease     stage III  . IBS (irritable bowel syndrome)   .  Glomerulonephritis     Dr [name]  . PAC (premature atrial contraction)   . Hypokalemia   . Interstitial cystitis   . Hypertension    Past Surgical History  Procedure Laterality Date  . Abdominal hysterectomy    . Breast biopsy Right   . Tonsillectomy    . Ep implantable device N/A 01/07/2015    Procedure: Pacemaker Implant;  Surgeon: Marinus Maw, MD;  Location: [facility] INVASIVE CV LAB;  Service: Cardiovascular;  Laterality: N/A;   Family History  Problem Relation Age of Onset  . Ovarian cancer Sister   . Colon cancer Neg Hx   . Heart disease Mother   . Heart disease Father   . Heart disease Brother   . Rheum arthritis Sister   . Melanoma Brother   . Brain cancer Brother    History  Substance Use Topics  . Smoking status: Former Games developer  . Smokeless tobacco: Never Used  . Alcohol Use: No   OB History    No data available     Review of Systems  Constitutional: Negative for fever.  Respiratory: Positive for shortness of breath. Negative for cough and wheezing.   Cardiovascular: Negative for chest pain, leg swelling and syncope.  Gastrointestinal: Negative for nausea.  Neurological: Negative for dizziness, syncope, weakness and headaches.  All other systems reviewed and are negative.     Allergies  Amlodipine; Contrast media; Pepcid; Prilosec; Cephalexin; Ciprofloxacin; Erythromycin; Sulfonamide derivatives; and Tetracycline  Home Medications  Prior to Admission medications   Medication Sig Start Date End Date Taking? Authorizing Provider  amiloride-hydrochlorothiazide (MODURETIC) 5-50 MG tablet Take 1 tablet by mouth daily.   Yes Historical Provider, MD  aspirin 325 MG tablet Take 325 mg by mouth daily.   Yes Historical Provider, MD  calcitRIOL (ROCALTROL) 0.25 MCG capsule Take 0.25 mcg by mouth every 3 (three) days.    Yes Historical Provider, MD  Cyanocobalamin (VITAMIN B-12) 1000 MCG SUBL Place 1,000 mcg under the tongue daily.    Yes Historical Provider, MD   fexofenadine (ALLEGRA) 180 MG tablet Take 180 mg by mouth daily.   Yes Historical Provider, MD  fluticasone (FLONASE) 50 MCG/ACT nasal spray Place 2 sprays into both nostrils as needed for allergies or rhinitis.   Yes Historical Provider, MD  hydrocortisone valerate ointment (WEST-CORT) 0.2 % Apply 1 application topically daily as needed (dermatitis).    Yes Historical Provider, MD  levothyroxine (SYNTHROID, LEVOTHROID) 75 MCG tablet Take 75 mcg by mouth daily at 6 PM.    Yes Historical Provider, MD  Misc Natural Products (TART CHERRY ADVANCED PO) Take 1 tablet by mouth daily.    Yes Historical Provider, MD  naproxen sodium (ANAPROX) 220 MG tablet Take 220 mg by mouth 2 (two) times daily as needed (pain).   Yes Historical Provider, MD  Probiotic Product (PROBIOTIC DAILY PO) Take 1 tablet by mouth daily.   Yes Historical Provider, MD  esomeprazole (NEXIUM) 20 MG capsule Take 1 capsule (20 mg total) by mouth daily at 12 noon. Patient not taking: Reported on 01/06/2015 01/19/14   Hart Carwin, MD   BP 120/42 mmHg  Pulse 69  Temp(Src) 97.4 F (36.3 C) (Axillary)  Resp 12  Ht  (1.702 m)  Wt 139 lb 15.9 oz (63.5 kg)  BMI 21.92 kg/m2  SpO2 92% Physical Exam  Constitutional: She appears well-nourished.  HENT:  Head: Normocephalic.  Eyes: Pupils are equal, round, and reactive to light.  Neck: Normal range of motion.  Cardiovascular: Regular rhythm.  Bradycardia present.   Murmur heard. Pulmonary/Chest: Effort normal.  Abdominal: Soft.  Musculoskeletal: Normal range of motion.  Neurological: She is alert.  Skin: Skin is warm.  Nursing note and vitals reviewed.   ED Course  Procedures (including critical care time) Labs Review Labs Reviewed  BASIC METABOLIC PANEL - Abnormal; Notable for the following:    BUN 37 (*)    Creatinine, Ser 1.79 (*)    GFR calc non Af Amer 27 (*)    GFR calc Af Amer 31 (*)    All other components within normal limits  BRAIN NATRIURETIC PEPTIDE -  Abnormal; Notable for the following:    B Natriuretic Peptide 639.0 (*)    All other components within normal limits  BASIC METABOLIC PANEL - Abnormal; Notable for the following:    Potassium 3.4 (*)    Glucose, Bld 109 (*)    BUN 32 (*)    Creatinine, Ser 1.63 (*)    GFR calc non Af Amer 30 (*)    GFR calc Af Amer 34 (*)    All other components within normal limits  URINALYSIS, ROUTINE W REFLEX MICROSCOPIC (NOT AT [facility]) - Abnormal; Notable for the following:    Hgb urine dipstick TRACE (*)    Protein, ur 30 (*)    Leukocytes, UA MODERATE (*)    All other components within normal limits  GLUCOSE, CAPILLARY - Abnormal; Notable for the following:    Glucose-Capillary 103 (*)  All other components within normal limits  URINE MICROSCOPIC-ADD ON - Abnormal; Notable for the following:    Bacteria, UA MANY (*)    All other components within normal limits  I-STAT CHEM 8, ED - Abnormal; Notable for the following:    BUN 37 (*)    Creatinine, Ser 1.80 (*)    All other components within normal limits  MRSA PCR SCREENING  CBC  MAGNESIUM  TSH  LIPID PANEL  HEMOGLOBIN A1C  I-STAT TROPOININ, ED  Rosezena Sensor, ED    Imaging Review Dg Chest 2 View  01/06/2015   CLINICAL DATA:  Dyspnea and bradycardia. Hypertension. Dyspnea is of 3 weeks duration.  EXAM: CHEST  2 VIEW  COMPARISON:  03/09/2014  FINDINGS: There is mild unchanged hyperinflation. There is new basilar interstitial thickening, likely due to a mild degree of interstitial fluid. There also is mild pulmonary vascular prominence. There is no confluent airspace opacity. There is no effusion. Heart size is borderline, larger than on 03/09/2014. Hilar and mediastinal contours are unremarkable and unchanged.  IMPRESSION: The vascular and interstitial changes are new and likely represent mild congestive heart failure.   Electronically Signed   By: Ellery Plunk M.D.   On: 01/06/2015 21:20     EKG Interpretation   Date/Time:   Thursday January 06 2015 20:12:34 EDT Ventricular Rate:  68 PR Interval:  277 QRS Duration: 149 QT Interval:  646 QTC Calculation: 687 R Axis:   -28 Text Interpretation:  Sinus rhythm Prolonged PR interval IVCD, consider  atypical LBBB Since last tracing rate slower Confirmed by Effie Shy  MD,  ELLIOTT [phone]) on 01/06/2015 8:29:44 PM      MDM   Final diagnoses:  Bradycardia         Earley Favor, NP 01/07/15 1956  Mancel Bale, MD 01/08/15 [phone]

## 2015-01-07 ENCOUNTER — Inpatient Hospital Stay (HOSPITAL_COMMUNITY): Payer: Medicare Other

## 2015-01-07 ENCOUNTER — Encounter (HOSPITAL_COMMUNITY): Payer: Self-pay | Admitting: Internal Medicine

## 2015-01-07 ENCOUNTER — Encounter (HOSPITAL_COMMUNITY)
Admission: EM | Disposition: A | Discharge status: Home / Self Care | Payer: Medicare Other | Source: Home / Self Care | Attending: Cardiology

## 2015-01-07 DIAGNOSIS — I441 Atrioventricular block, second degree: Secondary | ICD-10-CM

## 2015-01-07 DIAGNOSIS — I340 Nonrheumatic mitral (valve) insufficiency: Secondary | ICD-10-CM

## 2015-01-07 DIAGNOSIS — E876 Hypokalemia: Secondary | ICD-10-CM

## 2015-01-07 DIAGNOSIS — R001 Bradycardia, unspecified: Secondary | ICD-10-CM

## 2015-01-07 DIAGNOSIS — I442 Atrioventricular block, complete: Secondary | ICD-10-CM

## 2015-01-07 HISTORY — PX: EP IMPLANTABLE DEVICE: SHX172B

## 2015-01-07 LAB — BASIC METABOLIC PANEL
Anion gap: 8 (ref 5–15)
BUN: 32 mg/dL — AB (ref 6–20)
CALCIUM: 9.5 mg/dL (ref 8.9–10.3)
CHLORIDE: 107 mmol/L (ref 101–111)
CO2: 25 mmol/L (ref 22–32)
Creatinine, Ser: 1.63 mg/dL — ABNORMAL HIGH (ref 0.44–1.00)
GFR calc Af Amer: 34 mL/min — ABNORMAL LOW (ref >60)
GFR, EST NON AFRICAN AMERICAN: 30 mL/min — AB (ref >60)
Glucose, Bld: 109 mg/dL — ABNORMAL HIGH (ref 65–99)
POTASSIUM: 3.4 mmol/L — AB (ref 3.5–5.1)
SODIUM: 140 mmol/L (ref 135–145)

## 2015-01-07 LAB — URINALYSIS, ROUTINE W REFLEX MICROSCOPIC
Bilirubin Urine: NEGATIVE
Glucose, UA: NEGATIVE mg/dL
KETONES UR: NEGATIVE mg/dL
Nitrite: NEGATIVE
PH: 7 (ref 5.0–8.0)
PROTEIN: 30 mg/dL — AB
SPECIFIC GRAVITY, URINE: 1.006 (ref 1.005–1.030)
UROBILINOGEN UA: 0.2 mg/dL (ref 0.0–1.0)

## 2015-01-07 LAB — GLUCOSE, CAPILLARY: GLUCOSE-CAPILLARY: 103 mg/dL — AB (ref 65–99)

## 2015-01-07 LAB — TSH: TSH: 3.787 u[IU]/mL (ref 0.350–4.500)

## 2015-01-07 LAB — LIPID PANEL
CHOL/HDL RATIO: 3 ratio
Cholesterol: 153 mg/dL (ref 0–200)
HDL: 51 mg/dL (ref >40)
LDL Cholesterol: 89 mg/dL (ref 0–99)
TRIGLYCERIDES: 67 mg/dL (ref <150)
VLDL: 13 mg/dL (ref 0–40)

## 2015-01-07 LAB — MAGNESIUM: MAGNESIUM: 2 mg/dL (ref 1.7–2.4)

## 2015-01-07 LAB — URINE MICROSCOPIC-ADD ON

## 2015-01-07 LAB — MRSA PCR SCREENING: MRSA by PCR: NEGATIVE

## 2015-01-07 SURGERY — PACEMAKER IMPLANT
Anesthesia: LOCAL

## 2015-01-07 MED ORDER — LIDOCAINE HCL (PF) 1 % IJ SOLN
INTRAMUSCULAR | Status: DC | PRN
  Administered 2015-01-07: 40 mL via SUBCUTANEOUS

## 2015-01-07 MED ORDER — MIDAZOLAM HCL 5 MG/5ML IJ SOLN
INTRAMUSCULAR | Status: AC
Start: 2015-01-07 — End: 2015-01-07
  Filled 2015-01-07: qty 5

## 2015-01-07 MED ORDER — ACETAMINOPHEN 325 MG PO TABS: 325.0000 mg | ORAL_TABLET | ORAL | Status: DC | PRN

## 2015-01-07 MED ORDER — ATROPINE SULFATE 0.1 MG/ML IJ SOLN
INTRAMUSCULAR | Status: AC
  Filled 2015-01-07: qty 10

## 2015-01-07 MED ORDER — MIDAZOLAM HCL 5 MG/5ML IJ SOLN
INTRAMUSCULAR | Status: DC | PRN
  Administered 2015-01-07 (×3): 1 mg via INTRAVENOUS

## 2015-01-07 MED ORDER — SODIUM CHLORIDE 0.9 % IR SOLN: 80.0000 mg | Status: DC

## 2015-01-07 MED ORDER — CHLORHEXIDINE GLUCONATE 4 % EX LIQD
60.0000 mL | Freq: Once | CUTANEOUS | Status: AC
  Administered 2015-01-07: 4 via TOPICAL

## 2015-01-07 MED ORDER — HEPARIN (PORCINE) IN NACL 2-0.9 UNIT/ML-% IJ SOLN
INTRAMUSCULAR | Status: AC
  Filled 2015-01-07: qty 500

## 2015-01-07 MED ORDER — LIDOCAINE HCL (PF) 1 % IJ SOLN
INTRAMUSCULAR | Status: AC
  Filled 2015-01-07: qty 30

## 2015-01-07 MED ORDER — VANCOMYCIN HCL IN DEXTROSE 1-5 GM/200ML-% IV SOLN
1000.0000 mg | INTRAVENOUS | Status: DC
  Filled 2015-01-07: qty 200

## 2015-01-07 MED ORDER — FENTANYL CITRATE (PF) 100 MCG/2ML IJ SOLN
INTRAMUSCULAR | Status: AC
  Filled 2015-01-07: qty 2

## 2015-01-07 MED ORDER — METHYLPREDNISOLONE SODIUM SUCC 125 MG IJ SOLR
INTRAMUSCULAR | Status: DC | PRN
  Administered 2015-01-07: 62.5 mg via INTRAVENOUS

## 2015-01-07 MED ORDER — METHYLPREDNISOLONE SODIUM SUCC 125 MG IJ SOLR
INTRAMUSCULAR | Status: AC
  Filled 2015-01-07: qty 2

## 2015-01-07 MED ORDER — FENTANYL CITRATE (PF) 100 MCG/2ML IJ SOLN
INTRAMUSCULAR | Status: DC | PRN
  Administered 2015-01-07 (×3): 12.5 ug via INTRAVENOUS

## 2015-01-07 MED ORDER — GENTAMICIN SULFATE 40 MG/ML IJ SOLN
INTRAMUSCULAR | Status: AC
  Filled 2015-01-07: qty 2

## 2015-01-07 MED ORDER — CHLORHEXIDINE GLUCONATE 4 % EX LIQD
60.0000 mL | Freq: Once | CUTANEOUS | Status: AC
  Filled 2015-01-07: qty 60

## 2015-01-07 MED ORDER — VANCOMYCIN HCL 1000 MG IV SOLR
1000.0000 mg | INTRAVENOUS | Status: DC | PRN
  Administered 2015-01-07: 1000 mg via INTRAVENOUS

## 2015-01-07 MED ORDER — SODIUM CHLORIDE 0.9 % IV SOLN
INTRAVENOUS | Status: DC
  Administered 2015-01-07: 11:00:00 via INTRAVENOUS

## 2015-01-07 MED ORDER — SODIUM CHLORIDE 0.9 % IR SOLN
Status: DC | PRN
  Administered 2015-01-07: 500 mL

## 2015-01-07 MED ORDER — LORAZEPAM 0.5 MG PO TABS
0.5000 mg | ORAL_TABLET | ORAL | Status: DC | PRN
  Administered 2015-01-07: 0.5 mg via ORAL
  Filled 2015-01-07: qty 1

## 2015-01-07 MED ORDER — POTASSIUM CHLORIDE CRYS ER 20 MEQ PO TBCR
40.0000 meq | EXTENDED_RELEASE_TABLET | Freq: Once | ORAL | Status: AC
  Administered 2015-01-07: 40 meq via ORAL
  Filled 2015-01-07: qty 2

## 2015-01-07 MED ORDER — ONDANSETRON HCL 4 MG/2ML IJ SOLN: 4.0000 mg | Freq: Four times a day (QID) | INTRAMUSCULAR | Status: DC | PRN

## 2015-01-07 MED ORDER — DIPHENHYDRAMINE HCL 50 MG/ML IJ SOLN
INTRAMUSCULAR | Status: AC
  Filled 2015-01-07: qty 1

## 2015-01-07 MED ORDER — VANCOMYCIN HCL IN DEXTROSE 1-5 GM/200ML-% IV SOLN
1000.0000 mg | Freq: Two times a day (BID) | INTRAVENOUS | Status: AC
  Administered 2015-01-07: 1000 mg via INTRAVENOUS
  Filled 2015-01-07: qty 200

## 2015-01-07 MED ORDER — DIPHENHYDRAMINE HCL 50 MG/ML IJ SOLN
INTRAMUSCULAR | Status: DC | PRN
Start: 2015-01-07 — End: 2015-01-07
  Administered 2015-01-07: 25 mg via INTRAVENOUS

## 2015-01-07 SURGICAL SUPPLY — 11 items
CABLE SURGICAL S-101-97-12 (CABLE) ×1 IMPLANT
LEAD TENDRIL SDX 1688TC-52CM (Lead) ×1 IMPLANT
LEAD TENDRIL ST 1888TC-46CM (Lead) ×1 IMPLANT
PAD DEFIB LIFELINK (PAD) ×1 IMPLANT
PPM ASSURITY DR PM2240 (Pacemaker) ×1 IMPLANT
SHEATH CLASSIC 7F (SHEATH) IMPLANT
SHEATH CLASSIC 8F (SHEATH) IMPLANT
SHEATH CLASSIC 9.5F (SHEATH) IMPLANT
SHEATH CLASSIC 9F (SHEATH) ×1 IMPLANT
SHEATH COOK PEEL AWAY SET 7F (SHEATH) ×2 IMPLANT
TRAY PACEMAKER INSERTION (CUSTOM PROCEDURE TRAY) ×1 IMPLANT

## 2015-01-07 NOTE — Consult Note (Signed)
ELECTROPHYSIOLOGY CONSULT NOTE    Patient ID: Amanda [name] MRN: [id number], DOB/AGE: 76-Sep-1942 76 y.o.  Admit date: 01/06/2015 Date of Consult: 01/07/2015  Primary Physician: [name], MD Primary Cardiologist: [name]  Reason for Consultation: heart block  HPI:  Amanda [name] is a 74 y.o. female with a past medical history significant for hypertension, hypothyroidism, CKD, mitral regurgitation and DJD who has a several day history of elevated blood pressure, increased fatigue and shortness of breath. She was evaluated at her PCP's office and was found to have bradycardia for which she was brought to [facility] for further evaluation.  She was found to have 2:1 heart block with no reversible causes.  EP has been asked to evaluate for treatment options.  Echo last month demonstrated normal EF with mild to moderate MR.  Lab work is unremarkable.  She currently denies chest pain, shortness of breath, LE edema, recent fevers, chills, nausea or vomiting.   Past Medical History  Diagnosis Date  . Allergic rhinitis   . Hyperparathyroidism   . Seasonal allergies   . DJD (degenerative joint disease)   . MVP (mitral valve prolapse)     moderate posterior MVP with moderate MR and grade II diasotlic dysfunction  . Hypothyroidism   . Hyperlipidemia   . Anemia   . Small vessel disease, cerebrovascular   . Chronic kidney disease     stage III  . IBS (irritable bowel syndrome)   . Glomerulonephritis     Dr [name]  . PAC (premature atrial contraction)   . Hypokalemia   . Interstitial cystitis   . Hypertension      Surgical History:  Past Surgical History  Procedure Laterality Date  . Abdominal hysterectomy    . Breast biopsy Right   . Tonsillectomy       Prescriptions prior to admission  Medication Sig Dispense Refill Last Dose  . amiloride-hydrochlorothiazide (MODURETIC) 5-50 MG tablet Take 1 tablet by mouth daily.   01/06/2015 at Unknown time  . aspirin 325 MG tablet  Take 325 mg by mouth daily.   01/06/2015 at Unknown time  . calcitRIOL (ROCALTROL) 0.25 MCG capsule Take 0.25 mcg by mouth every 3 (three) days.    01/05/2015 at Unknown time  . Cyanocobalamin (VITAMIN B-12) 1000 MCG SUBL Place 1,000 mcg under the tongue daily.    01/06/2015 at Unknown time  . fexofenadine (ALLEGRA) 180 MG tablet Take 180 mg by mouth daily.   01/06/2015 at Unknown time  . fluticasone (FLONASE) 50 MCG/ACT nasal spray Place 2 sprays into both nostrils as needed for allergies or rhinitis.   01/06/2015 at Unknown time  . hydrocortisone valerate ointment (WEST-CORT) 0.2 % Apply 1 application topically daily as needed (dermatitis).    over 30 days  . levothyroxine (SYNTHROID, LEVOTHROID) 75 MCG tablet Take 75 mcg by mouth daily at 6 PM.    01/06/2015 at Unknown time  . Misc Natural Products (TART CHERRY ADVANCED PO) Take 1 tablet by mouth daily.    01/06/2015 at Unknown time  . naproxen sodium (ANAPROX) 220 MG tablet Take 220 mg by mouth 2 (two) times daily as needed (pain).   01/05/2015 at Unknown time  . Probiotic Product (PROBIOTIC DAILY PO) Take 1 tablet by mouth daily.   01/06/2015 at Unknown time  . esomeprazole (NEXIUM) 20 MG capsule Take 1 capsule (20 mg total) by mouth daily at 12 noon. (Patient not taking: Reported on 01/06/2015) 3 capsule 2 Not Taking at Unknown time  Inpatient Medications:  . aspirin  325 mg Oral Daily  . atropine      . [START ON 01/08/2015] calcitRIOL  0.25 mcg Oral Q3 days  . levothyroxine  75 mcg Oral QAC breakfast  . pantoprazole  80 mg Oral Q1200  . potassium chloride  40 mEq Oral Once    Allergies:  Allergies  Allergen Reactions  . Amlodipine     PEDAL EDEMA   . Contrast Media [Iodinated Diagnostic Agents] Hives    IVP dye per patient  . Pepcid [Famotidine] Swelling    "Throat Swelling"   . Prilosec [Omeprazole] Nausea Only  . Cephalexin Rash  . Ciprofloxacin Rash  . Erythromycin Rash  . Sulfonamide Derivatives Rash  . Tetracycline Rash     History   Social History  . Marital Status: Married    Spouse Name: N/A  . Number of Children: 2  . Years of Education: N/A   Occupational History  . retired    Social History Main Topics  . Smoking status: Former Games developer  . Smokeless tobacco: Never Used  . Alcohol Use: No  . Drug Use: No  . Sexual Activity: Not on file   Other Topics Concern  . Not on file   Social History Narrative     Family History  Problem Relation Age of Onset  . Ovarian cancer Sister   . Colon cancer Neg Hx   . Heart disease Mother   . Heart disease Father   . Heart disease Brother   . Rheum arthritis Sister   . Melanoma Brother   . Brain cancer Brother      Review of Systems: All other systems reviewed and are otherwise negative except as noted above.  Physical Exam: Filed Vitals:   01/07/15 0030 01/07/15 0100 01/07/15 0300 01/07/15 0758  BP: 160/69 158/36 159/33 126/94  Pulse: 35 86 35 37  Temp:   97.7 F (36.5 C) 97.8 F (36.6 C)  TempSrc:   Oral Oral  Resp: Height:      Weight:      SpO2: 98% 96% 97% 97%    GEN- The patient is well appearing, alert and oriented x 3 today.   HEENT: normocephalic, atraumatic; sclera clear, conjunctiva pink; hearing intact; oropharynx clear; neck supple, no JVP Lymph- no cervical lymphadenopathy Lungs- Clear to ausculation bilaterally, normal work of breathing.  No wheezes, rales, rhonchi Heart- Regular rate and rhythm, no murmurs, rubs or gallops  GI- soft, non-tender, non-distended, bowel sounds present  Extremities- no clubbing, cyanosis, or edema; DP/PT/radial pulses 2+ bilaterally MS- no significant deformity or atrophy Skin- warm and dry, no rash or lesion Psych- euthymic mood, full affect Neuro- strength and sensation are intact  Labs:   Lab Results  Component Value Date   WBC 9.9 01/06/2015   HGB 13.9 01/06/2015   HCT 41.0 01/06/2015   MCV 90.1 01/06/2015   PLT 208 01/06/2015    Recent Labs Lab  01/07/15 0304  NA 140  K 3.4*  CL 107  CO2 25  BUN 32*  CREATININE 1.63*  CALCIUM 9.5  GLUCOSE 109*      Radiology/Studies: Dg Chest 2 View 01/06/2015   CLINICAL DATA:  Dyspnea and bradycardia. Hypertension. Dyspnea is of 3 weeks duration.  EXAM: CHEST  2 VIEW  COMPARISON:  03/09/2014  FINDINGS: There is mild unchanged hyperinflation. There is new basilar interstitial thickening, likely due to a mild degree of interstitial fluid. There also is mild pulmonary  vascular prominence. There is no confluent airspace opacity. There is no effusion. Heart size is borderline, larger than on 03/09/2014. Hilar and mediastinal contours are unremarkable and unchanged.  IMPRESSION: The vascular and interstitial changes are new and likely represent mild congestive heart failure.   Electronically Signed   By: Ellery Plunk M.D.   On: 01/06/2015 21:20    EKG:2:1 heart block, rate 34  TELEMETRY: 2:1 heart block, ventricular rate 30-40's  Assessment/Plan: 1.  2:1 heart block The patient has symptomatic 2:1 heart block without reversible cause identified.  Risks, benefits to pacemaker implantation were reviewed with the patient who wishes to proceed. We will plan pacemaker implant later today as our schedule allows.   2.  HTN Stable No change required today    Signed, Gypsy Balsam, NP 01/07/2015 8:14 AM  EP Attending  Patient seen and examined. Agree with the history, exam, assessment and plan as noted with minimal changes. She has symptomatic 2:1 AV block with no AV nodal blocking drugs on board. I have discussed the risk/benefits/goals/expectations of PPM with the patient and she wishes to proceed.  [name]

## 2015-01-07 NOTE — Progress Notes (Addendum)
    Subjective:  OK in bed. No Syncope. Feels like she needs a pacer, she states.   Objective:  Vital Signs in the last 24 hours: Temp:  [97.7 F (36.5 C)-97.8 F (36.6 C)] 97.7 F (36.5 C) (07/01 0300) Pulse Rate:  [29-120] 35 (07/01 0300) Resp:  [12-20] 14 (07/01 0300) BP: (130-197)/(31-116) 159/33 mmHg (07/01 0300) SpO2:  [94 %-100 %] 97 % (07/01 0300) Weight:  [139 lb 15.9 oz (63.5 kg)-140 lb (63.504 kg)] 139 lb 15.9 oz (63.5 kg) (06/30 2300)  Intake/Output from previous day: 06/30 0701 - 07/01 0700 In: -  Out: 300 [Urine:300]   Physical Exam: General: Well developed, well nourished, in no acute distress. Head:  Normocephalic and atraumatic. Lungs: Clear to auscultation and percussion. Heart: Brady Reg S1 and S2.  No murmur, rubs or gallops.  Abdomen: soft, non-tender, positive bowel sounds. Extremities: No clubbing or cyanosis. No edema. Neurologic: Alert and oriented x 3.    Lab Results:  Recent Labs  01/06/15 2021 01/06/15 2047  WBC 9.9  --   HGB 12.5 13.9  PLT 208  --     Recent Labs  01/06/15 2021 01/06/15 2047 01/07/15 0304  NA 138 138 140  K 4.5 4.4 3.4*  CL 105 103 107  CO2 22  --  25  GLUCOSE 96 92 109*  BUN 37* 37* 32*  CREATININE 1.79* 1.80* 1.63*    Recent Labs  01/07/15 0304  CHOL 153   No results for input(s): PROTIME in the last 72 hours.  Imaging: Dg Chest 2 View  01/06/2015   CLINICAL DATA:  Dyspnea and bradycardia. Hypertension. Dyspnea is of 3 weeks duration.  EXAM: CHEST  2 VIEW  COMPARISON:  03/09/2014  FINDINGS: There is mild unchanged hyperinflation. There is new basilar interstitial thickening, likely due to a mild degree of interstitial fluid. There also is mild pulmonary vascular prominence. There is no confluent airspace opacity. There is no effusion. Heart size is borderline, larger than on 03/09/2014. Hilar and mediastinal contours are unremarkable and unchanged.  IMPRESSION: The vascular and interstitial changes are  new and likely represent mild congestive heart failure.   Electronically Signed   By: Ellery Plunk M.D.   On: 01/06/2015 21:20   Personally viewed.   Telemetry: SB with HR 37 2:1 AV block Personally viewed.   EKG:  Same    Assessment/Plan:  Active Problems:   AV block  1) Symptomatic bradycardia (AV block 2:1)  - Pacemaker  - NPO  - Discussed with EP team.    2) Hypokalemia  - 3.4  - KCL  3) Moderate MR/MVP  - stable  - EF 65% (ECHO 12/16/14)  4) AKI  - baseline creat 1.4 to 1.8 down to 1.63  - Likely decreased renal perfusion.    Stephani Janak 01/07/2015, 7:58 AM

## 2015-01-07 NOTE — Interval H&P Note (Signed)
History and Physical Interval Note: patient developed PMVT which stopped spontaneously before defibrillation. She will undergo PPM.  01/07/2015 12:00 PM  Amanda [name] A Pluta  has presented today for surgery, with the diagnosis of complete heart block  The various methods of treatment have been discussed with the patient and family. After consideration of risks, benefits and other options for treatment, the patient has consented to  Procedure(s): Pacemaker Implant (N/A) as a surgical intervention .  The patient's history has been reviewed, patient examined, no change in status, stable for surgery.  I have reviewed the patient's chart and labs.  Questions were answered to the patient's satisfaction.     Lewayne Bunting

## 2015-01-07 NOTE — Progress Notes (Signed)
While signing consent for PPM @ 1103 patient suddenly felt lightheaded and then went unconscious.  Telemetry monitor strip attached to chart.  Strip appeared to show bradycardic rhythm then VF.  Patient came out of this rhythm on her own without being defibrillated.  MD notified via Cath Lab staff and in route to assess patient.  VSS, defibrillator pads in place and will continue to monitor.  Currently awaiting PPM placement today.  Husband at bedside and aware of event.

## 2015-01-07 NOTE — Discharge Instructions (Signed)
° ° °  Supplemental Discharge Instructions for  Pacemaker/Defibrillator Patients  Activity No heavy lifting or vigorous activity with your left/right arm for 6 to 8 weeks.  Do not raise your left/right arm above your head for one week.  Gradually raise your affected arm as drawn below.           __          01/11/15                 01/12/15                        01/13/15                     01/14/15  NO DRIVING for  1 week   ; you may begin driving on 01/09/52    .  WOUND CARE - Keep the wound area clean and dry.  Do not get this area wet for one week. No showers for one week; you may shower on   01/14/15  . - The tape/steri-strips on your wound will fall off; do not pull them off.  No bandage is needed on the site.  DO  NOT apply any creams, oils, or ointments to the wound area. - If you notice any drainage or discharge from the wound, any swelling or bruising at the site, or you develop a fever > 101? F after you are discharged home, call the office at once.  Special Instructions - You are still able to use cellular telephones; use the ear opposite the side where you have your pacemaker/defibrillator.  Avoid carrying your cellular phone near your device. - When traveling through airports, show security personnel your identification card to avoid being screened in the metal detectors.  Ask the security personnel to use the hand wand. - Avoid arc welding equipment, MRI testing (magnetic resonance imaging), TENS units (transcutaneous nerve stimulators).  Call the office for questions about other devices. - Avoid electrical appliances that are in poor condition or are not properly grounded. - Microwave ovens are safe to be near or to operate.

## 2015-01-07 NOTE — Progress Notes (Signed)
Dr. [name] and Amber,N.P. notified regarding Lt. Subclavian hematoma noted s/p arrival to 2H23.  Pressure held to site until pressure dressing applied to site by Merchandiser, retail (Jeremy,R.N.).  Dressing site marked d/t drainage.  LUE remains in sling.  Acetaminophen given for discomfort. Will continue to monitor for further changes.  Dr. [name] at bedside.

## 2015-01-07 NOTE — Progress Notes (Signed)
   01/07/15 1100  Clinical Encounter Type  Visited With Health care provider  Visit Type Initial;Code;Critical Care   Chaplain responded to a code blue page to patient's room. Medical team was explaining situation to patient's husband. No chaplain support needed at this time.  Cranston Neighbor, Chaplain  11:16 AM

## 2015-01-07 NOTE — H&P (View-Only) (Signed)
ELECTROPHYSIOLOGY CONSULT NOTE    Patient ID: Amanda [name] MRN: [id number], DOB/AGE: 76-Sep-1942 76 y.o.  Admit date: 01/06/2015 Date of Consult: 01/07/2015  Primary Physician: [name], MD Primary Cardiologist: [name]  Reason for Consultation: heart block  HPI:  Amanda [name] is a 74 y.o. female with a past medical history significant for hypertension, hypothyroidism, CKD, mitral regurgitation and DJD who has a several day history of elevated blood pressure, increased fatigue and shortness of breath. She was evaluated at her PCP's office and was found to have bradycardia for which she was brought to [facility] for further evaluation.  She was found to have 2:1 heart block with no reversible causes.  EP has been asked to evaluate for treatment options.  Echo last month demonstrated normal EF with mild to moderate MR.  Lab work is unremarkable.  She currently denies chest pain, shortness of breath, LE edema, recent fevers, chills, nausea or vomiting.   Past Medical History  Diagnosis Date  . Allergic rhinitis   . Hyperparathyroidism   . Seasonal allergies   . DJD (degenerative joint disease)   . MVP (mitral valve prolapse)     moderate posterior MVP with moderate MR and grade II diasotlic dysfunction  . Hypothyroidism   . Hyperlipidemia   . Anemia   . Small vessel disease, cerebrovascular   . Chronic kidney disease     stage III  . IBS (irritable bowel syndrome)   . Glomerulonephritis     Dr [name]  . PAC (premature atrial contraction)   . Hypokalemia   . Interstitial cystitis   . Hypertension      Surgical History:  Past Surgical History  Procedure Laterality Date  . Abdominal hysterectomy    . Breast biopsy Right   . Tonsillectomy       Prescriptions prior to admission  Medication Sig Dispense Refill Last Dose  . amiloride-hydrochlorothiazide (MODURETIC) 5-50 MG tablet Take 1 tablet by mouth daily.   01/06/2015 at Unknown time  . aspirin 325 MG tablet  Take 325 mg by mouth daily.   01/06/2015 at Unknown time  . calcitRIOL (ROCALTROL) 0.25 MCG capsule Take 0.25 mcg by mouth every 3 (three) days.    01/05/2015 at Unknown time  . Cyanocobalamin (VITAMIN B-12) 1000 MCG SUBL Place 1,000 mcg under the tongue daily.    01/06/2015 at Unknown time  . fexofenadine (ALLEGRA) 180 MG tablet Take 180 mg by mouth daily.   01/06/2015 at Unknown time  . fluticasone (FLONASE) 50 MCG/ACT nasal spray Place 2 sprays into both nostrils as needed for allergies or rhinitis.   01/06/2015 at Unknown time  . hydrocortisone valerate ointment (WEST-CORT) 0.2 % Apply 1 application topically daily as needed (dermatitis).    over 30 days  . levothyroxine (SYNTHROID, LEVOTHROID) 75 MCG tablet Take 75 mcg by mouth daily at 6 PM.    01/06/2015 at Unknown time  . Misc Natural Products (TART CHERRY ADVANCED PO) Take 1 tablet by mouth daily.    01/06/2015 at Unknown time  . naproxen sodium (ANAPROX) 220 MG tablet Take 220 mg by mouth 2 (two) times daily as needed (pain).   01/05/2015 at Unknown time  . Probiotic Product (PROBIOTIC DAILY PO) Take 1 tablet by mouth daily.   01/06/2015 at Unknown time  . esomeprazole (NEXIUM) 20 MG capsule Take 1 capsule (20 mg total) by mouth daily at 12 noon. (Patient not taking: Reported on 01/06/2015) 3 capsule 2 Not Taking at Unknown time  Inpatient Medications:  . aspirin  325 mg Oral Daily  . atropine      . [START ON 01/08/2015] calcitRIOL  0.25 mcg Oral Q3 days  . levothyroxine  75 mcg Oral QAC breakfast  . pantoprazole  80 mg Oral Q1200  . potassium chloride  40 mEq Oral Once    Allergies:  Allergies  Allergen Reactions  . Amlodipine     PEDAL EDEMA   . Contrast Media [Iodinated Diagnostic Agents] Hives    IVP dye per patient  . Pepcid [Famotidine] Swelling    "Throat Swelling"   . Prilosec [Omeprazole] Nausea Only  . Cephalexin Rash  . Ciprofloxacin Rash  . Erythromycin Rash  . Sulfonamide Derivatives Rash  . Tetracycline Rash     History   Social History  . Marital Status: Married    Spouse Name: N/A  . Number of Children: 2  . Years of Education: N/A   Occupational History  . retired    Social History Main Topics  . Smoking status: Former Games developer  . Smokeless tobacco: Never Used  . Alcohol Use: No  . Drug Use: No  . Sexual Activity: Not on file   Other Topics Concern  . Not on file   Social History Narrative     Family History  Problem Relation Age of Onset  . Ovarian cancer Sister   . Colon cancer Neg Hx   . Heart disease Mother   . Heart disease Father   . Heart disease Brother   . Rheum arthritis Sister   . Melanoma Brother   . Brain cancer Brother      Review of Systems: All other systems reviewed and are otherwise negative except as noted above.  Physical Exam: Filed Vitals:   01/07/15 0030 01/07/15 0100 01/07/15 0300 01/07/15 0758  BP: 160/69 158/36 159/33 126/94  Pulse: 35 86 35 37  Temp:   97.7 F (36.5 C) 97.8 F (36.6 C)  TempSrc:   Oral Oral  Resp: Height:      Weight:      SpO2: 98% 96% 97% 97%    GEN- The patient is well appearing, alert and oriented x 3 today.   HEENT: normocephalic, atraumatic; sclera clear, conjunctiva pink; hearing intact; oropharynx clear; neck supple, no JVP Lymph- no cervical lymphadenopathy Lungs- Clear to ausculation bilaterally, normal work of breathing.  No wheezes, rales, rhonchi Heart- Regular rate and rhythm, no murmurs, rubs or gallops  GI- soft, non-tender, non-distended, bowel sounds present  Extremities- no clubbing, cyanosis, or edema; DP/PT/radial pulses 2+ bilaterally MS- no significant deformity or atrophy Skin- warm and dry, no rash or lesion Psych- euthymic mood, full affect Neuro- strength and sensation are intact  Labs:   Lab Results  Component Value Date   WBC 9.9 01/06/2015   HGB 13.9 01/06/2015   HCT 41.0 01/06/2015   MCV 90.1 01/06/2015   PLT 208 01/06/2015    Recent Labs Lab  01/07/15 0304  NA 140  K 3.4*  CL 107  CO2 25  BUN 32*  CREATININE 1.63*  CALCIUM 9.5  GLUCOSE 109*      Radiology/Studies: Dg Chest 2 View 01/06/2015   CLINICAL DATA:  Dyspnea and bradycardia. Hypertension. Dyspnea is of 3 weeks duration.  EXAM: CHEST  2 VIEW  COMPARISON:  03/09/2014  FINDINGS: There is mild unchanged hyperinflation. There is new basilar interstitial thickening, likely due to a mild degree of interstitial fluid. There also is mild pulmonary  vascular prominence. There is no confluent airspace opacity. There is no effusion. Heart size is borderline, larger than on 03/09/2014. Hilar and mediastinal contours are unremarkable and unchanged.  IMPRESSION: The vascular and interstitial changes are new and likely represent mild congestive heart failure.   Electronically Signed   By: Ellery Plunk M.D.   On: 01/06/2015 21:20    EKG:2:1 heart block, rate 34  TELEMETRY: 2:1 heart block, ventricular rate 30-40's  Assessment/Plan: 1.  2:1 heart block The patient has symptomatic 2:1 heart block without reversible cause identified.  Risks, benefits to pacemaker implantation were reviewed with the patient who wishes to proceed. We will plan pacemaker implant later today as our schedule allows.   2.  HTN Stable No change required today    Signed, Gypsy Balsam, NP 01/07/2015 8:14 AM  EP Attending  Patient seen and examined. Agree with the history, exam, assessment and plan as noted with minimal changes. She has symptomatic 2:1 AV block with no AV nodal blocking drugs on board. I have discussed the risk/benefits/goals/expectations of PPM with the patient and she wishes to proceed.  [name]

## 2015-01-07 NOTE — Interval H&P Note (Signed)
History and Physical Interval Note:  01/07/2015 1:50 PM  Amanda [name]  has presented today for surgery, with the diagnosis of complete heart block  The various methods of treatment have been discussed with the patient and family. After consideration of risks, benefits and other options for treatment, the patient has consented to  Procedure(s): Pacemaker Implant (N/A) as a surgical intervention .  The patient's history has been reviewed, patient examined, no change in status, stable for surgery.  I have reviewed the patient's chart and labs.  Questions were answered to the patient's satisfaction.     Lewayne Bunting

## 2015-01-07 NOTE — Plan of Care (Signed)
Problem: Consults Goal: Permanent Pacemaker Patient Education (See Patient Education module for education specifics.) Outcome: Progressing Information given to view PPM videos.

## 2015-01-07 NOTE — Care Management Note (Signed)
Case Management Note  Patient Details  Name: Amanda [name] MRN: [id number] Date of Birth: [birth year]  Subjective/Objective:      Adm w heart block              Action/Plan: lives w husband,  pcp dr Selena Batten   Expected Discharge Date:                  Expected Discharge Plan:     In-House Referral:     Discharge planning Services     Post Acute Care Choice:    Choice offered to:     DME Arranged:    DME Agency:     HH Arranged:    HH Agency:     Status of Service:     Medicare Important Message Given:    Date Medicare IM Given:    Medicare IM give by:    Date Additional Medicare IM Given:    Additional Medicare Important Message give by:     If discussed at Long Length of Stay Meetings, dates discussed:    Additional Comments: ur review done  Hanley Hays, RN 01/07/2015, 8:27 AM

## 2015-01-08 ENCOUNTER — Inpatient Hospital Stay (HOSPITAL_COMMUNITY): Payer: Medicare Other

## 2015-01-08 DIAGNOSIS — I442 Atrioventricular block, complete: Principal | ICD-10-CM

## 2015-01-08 LAB — HEMOGLOBIN A1C
Hgb A1c MFr Bld: 6 % — ABNORMAL HIGH (ref 4.8–5.6)
MEAN PLASMA GLUCOSE: 126 mg/dL

## 2015-01-08 MED ORDER — MORPHINE SULFATE 2 MG/ML IJ SOLN
2.0000 mg | INTRAMUSCULAR | Status: DC | PRN
  Administered 2015-01-08 (×2): 2 mg via INTRAVENOUS
  Filled 2015-01-08 (×2): qty 1

## 2015-01-08 MED ORDER — ATENOLOL 25 MG PO TABS: 12.5000 mg | ORAL_TABLET | Freq: Two times a day (BID) | ORAL | Status: DC

## 2015-01-08 NOTE — Plan of Care (Signed)
Problem: Phase II Progression Outcomes Goal: No evidence of pacemaker malfunction Outcome: Completed/Met Date Met:  01/08/15 P.A. made aware of Left underarm hematoma and soreness.  No orders given.

## 2015-01-08 NOTE — Discharge Summary (Signed)
Discharge Summary   Patient ID: RAHA SIRBAUGH,  MRN: [id number], DOB/AGE: 1941/06/10 74 y.o.  Admit date: 01/06/2015 Discharge date: 01/08/2015  Primary Care Provider: [facility] Primary Cardiologist: [name] Primary Electrophysiologist: Dr. [name]  Discharge Diagnoses Principal Problem:   AV block Active Problems:   Essential hypertension   MVP (mitral valve prolapse)   PAC (premature atrial contraction)   Mitral regurgitation   RBBB   Allergies Allergies  Allergen Reactions  . Amlodipine     PEDAL EDEMA   . Contrast Media [Iodinated Diagnostic Agents] Hives    IVP dye per patient  . Pepcid [Famotidine] Swelling    "Throat Swelling"   . Prilosec [Omeprazole] Nausea Only  . Cephalexin Rash  . Ciprofloxacin Rash  . Erythromycin Rash  . Sulfonamide Derivatives Rash  . Tetracycline Rash    Procedures  PPM Implantation  PREPROCEDURE DIAGNOSIS: Symptomatic Bradycardia due to complete heart block   POSTPROCEDURE DIAGNOSIS: Same as preprocedure diagnosis   PROCEDURES:  1. Pacemaker implantation.    Device Placement: The leads were then connected to a St. Jude (serial number [id number]) pacemaker. The pocket was irrigated with copious gentamicin solution. The pacemaker was then placed into the pocket. The pocket was then closed in 2 layers with 2.0 Vicryl suture for the subcutaneous and subcuticular layers. Steri-Strips and a sterile dressing were then applied. There were no early apparent complications.    CONCLUSIONS:  1. Successful implantation of a St. Jude dual-chamber pacemaker for symptomatic bradycardia due to complete heart block 2. No early apparent complications.       Hospital Course  The patient is a 74 year old Caucasian female with past medical history of hypothyroidism on Synthroid, PACs, MVP with moderate MR, type I second-degree AV block, and hypertension who presented on 01/06/2015 with advanced heart block. She  was seen at her PCPs office at [facility] family practice, who noted she was having bradycardia into the 30s as into the ED. On arrival to the ED, she was bradycardic with heart rate is 37, hypertensive with blood pressure 194/50. Chest x-ray demonstrated vascular congestion with mild edema. EKG showed 2-1 AV block with ventricular rate of 38, QRS has left bundle branch block morphology. She was not on any AV nodal agent at the time. Electrophysiology was consulted on 01/07/2015. Patient was felt to have symptomatic 2:1 heart block without reversible cause identified. After discussing various options, patient agreed to undergo pacemaker implantation.  She underwent the procedure on 01/07/2015 and had a St. Jude dual-chamber pacemaker (serial number [id number]) placed. She was seen on the following day, at which time she was doing well without significant chest pain, shortness of breath or any discomfort. She was placed on atenolol 12.5 mg twice a day. Chest x-ray was obtained and shows no obvious complication. Prior to discharge, she did had small bulging of left flank right under her armpit, the area is decreasing in size and appears to be soft. Structures the patient to continue monitoring it for now, she has been instructed to contact cardiology if the bulge increases. She has a follow-up scheduled with device clinic in 1-2 weeks. I have also sent staff message to our scheduler to contact the patient to arrange a 3 month follow-up with Dr. [name].   Discharge Vitals Blood pressure 143/55, pulse 69, temperature 97.8 F (36.6 C), temperature source Oral, resp. rate 25, height 5\' 7"  (1.702 m), weight 139 lb 15.9 oz (63.5 kg), SpO2 98 %.  Filed Weights   01/06/15 2013 01/06/15 2300  Weight: 140 lb (63.504 kg) 139 lb 15.9 oz (63.5 kg)    Labs  CBC  Recent Labs  01/06/15 2021 01/06/15 2047  WBC 9.9  --   HGB 12.5 13.9  HCT 37.3 41.0  MCV 90.1  --   PLT 208  --    Basic Metabolic Panel  Recent Labs   16/10/96 2021 01/06/15 2047 01/06/15 2339 01/07/15 0304  NA 138 138  --  140  K 4.5 4.4  --  3.4*  CL 105 103  --  107  CO2 22  --   --  25  GLUCOSE 96 92  --  109*  BUN 37* 37*  --  32*  CREATININE 1.79* 1.80*  --  1.63*  CALCIUM 9.5  --   --  9.5  MG  --   --  2.0  --    Hemoglobin A1C  Recent Labs  01/06/15 2339  HGBA1C 6.0*   Fasting Lipid Panel  Recent Labs  01/07/15 0304  CHOL 153  HDL 51  LDLCALC 89  TRIG 67  CHOLHDL 3.0   Thyroid Function Tests  Recent Labs  01/06/15 2339  TSH 3.787    Disposition  Pt is being discharged home today in good condition.  Follow-up Plans & Appointments      Follow-up Information    Follow up with [facility] On 01/17/2015.   Why:  at 4:30PM for wound check   Contact information:   [address] [place] [address]       Follow up with Lewayne Bunting, MD.   Specialty:  Cardiology   Why:  Office will contact you to arrange followup with Dr. [name] for 3 month followup   Contact information:   [address] [place] [address] [phone]       Discharge Medications    Medication List    STOP taking these medications        amiloride-hydrochlorothiazide 5-50 MG tablet  Commonly known as:  MODURETIC      TAKE these medications        aspirin 325 MG tablet  Take 325 mg by mouth daily.     atenolol 25 MG tablet  Commonly known as:  TENORMIN  Take 0.5 tablets (12.5 mg total) by mouth 2 (two) times daily.     calcitRIOL 0.25 MCG capsule  Commonly known as:  ROCALTROL  Take 0.25 mcg by mouth every 3 (three) days.     esomeprazole 20 MG capsule  Commonly known as:  NEXIUM  Take 1 capsule (20 mg total) by mouth daily at 12 noon.     fexofenadine 180 MG tablet  Commonly known as:  ALLEGRA  Take 180 mg by mouth daily.     fluticasone 50 MCG/ACT nasal spray  Commonly known as:  FLONASE  Place 2 sprays into both nostrils as needed for  allergies or rhinitis.     hydrocortisone valerate ointment 0.2 %  Commonly known as:  WEST-CORT  Apply 1 application topically daily as needed (dermatitis).     levothyroxine 75 MCG tablet  Commonly known as:  SYNTHROID, LEVOTHROID  Take 75 mcg by mouth daily at 6 PM.     naproxen sodium 220 MG tablet  Commonly known as:  ANAPROX  Take 220 mg by mouth 2 (two) times daily as needed (pain).     PROBIOTIC DAILY PO  Take 1 tablet by mouth daily.  TART CHERRY ADVANCED PO  Take 1 tablet by mouth daily.     Vitamin B-12 1000 MCG Subl  Place 1,000 mcg under the tongue daily.        Duration of Discharge Encounter   Greater than 30 minutes including physician time.  Ramond Dial PA-C Pager: [phone] 01/08/2015, 12:57 PM   EP Attending  Patient seen and examined. Agree with above findings and plan. Ok for discharge with usual followup. Her device has been evaluated and working normally and her small hematoma is stable.  [name]

## 2015-01-08 NOTE — Progress Notes (Signed)
Patient ID: Amanda [name], female   DOB: 1940/11/05, 74 y.o.   MRN: [id number]    SUBJECTIVE: The patient is doing well today.  At this time, she denies chest pain, shortness of breath, or any new concerns.  [name] aspirin  325 mg Oral Daily  . calcitRIOL  0.25 mcg Oral Q3 days  . levothyroxine  75 mcg Oral QAC breakfast  . pantoprazole  80 mg Oral Q1200      OBJECTIVE: Physical Exam: Filed Vitals:   01/08/15 0000 01/08/15 0307 01/08/15 0400 01/08/15 0820  BP: 127/44 187/60 152/59 165/65  Pulse: 71 73 68 71  Temp:  97.2 F (36.2 C)  97.7 F (36.5 C)  TempSrc:  Oral  Oral  Resp: 14 19 14 21   Height:      Weight:      SpO2: 95% 96% 96% 99%    Intake/Output Summary (Last 24 hours) at 01/08/15 0835 Last data filed at 01/08/15 0300  Gross per 24 hour  Intake    875 ml  Output    300 ml  Net    575 ml    Telemetry reveals sinus rhythm with ventricular pacing  GEN- The patient is well appearing, alert and oriented x 3 today.   Head- normocephalic, atraumatic Eyes-  Sclera clear, conjunctiva pink Ears- hearing intact Oropharynx- clear Neck- supple, no JVP Lymph- no cervical lymphadenopathy Lungs- Clear to ausculation bilaterally, normal work of breathing, minimal hematoma Heart- Regular rate and rhythm, no murmurs, rubs or gallops, PMI not laterally displaced GI- soft, NT, ND, + BS Extremities- no clubbing, cyanosis, or edema Skin- no rash or lesion Psych- euthymic mood, full affect Neuro- strength and sensation are intact  LABS: Basic Metabolic Panel:  Recent Labs  97/53/00 2021 01/06/15 2047 01/06/15 2339 01/07/15 0304  NA 138 138  --  140  K 4.5 4.4  --  3.4*  CL 105 103  --  107  CO2 22  --   --  25  GLUCOSE 96 92  --  109*  BUN 37* 37*  --  32*  CREATININE 1.79* 1.80*  --  1.63*  CALCIUM 9.5  --   --  9.5  MG  --   --  2.0  --    Liver Function Tests: No results for input(s): AST, ALT, ALKPHOS, BILITOT, PROT, ALBUMIN in the last 72 hours. No  results for input(s): LIPASE, AMYLASE in the last 72 hours. CBC:  Recent Labs  01/06/15 2021 01/06/15 2047  WBC 9.9  --   HGB 12.5 13.9  HCT 37.3 41.0  MCV 90.1  --   PLT 208  --    Cardiac Enzymes: No results for input(s): CKTOTAL, CKMB, CKMBINDEX, TROPONINI in the last 72 hours. BNP: Invalid input(s): POCBNP D-Dimer: No results for input(s): DDIMER in the last 72 hours. Hemoglobin A1C:  Recent Labs  01/06/15 2339  HGBA1C 6.0*   Fasting Lipid Panel:  Recent Labs  01/07/15 0304  CHOL 153  HDL 51  LDLCALC 89  TRIG 67  CHOLHDL 3.0   Thyroid Function Tests:  Recent Labs  01/06/15 2339  TSH 3.787   Anemia Panel: No results for input(s): VITAMINB12, FOLATE, FERRITIN, TIBC, IRON, RETICCTPCT in the last 72 hours.  RADIOLOGY: Dg Chest 2 View  01/06/2015   CLINICAL DATA:  Dyspnea and bradycardia. Hypertension. Dyspnea is of 3 weeks duration.  EXAM: CHEST  2 VIEW  COMPARISON:  03/09/2014  FINDINGS: There is mild unchanged hyperinflation. There is  new basilar interstitial thickening, likely due to a mild degree of interstitial fluid. There also is mild pulmonary vascular prominence. There is no confluent airspace opacity. There is no effusion. Heart size is borderline, larger than on 03/09/2014. Hilar and mediastinal contours are unremarkable and unchanged.  IMPRESSION: The vascular and interstitial changes are new and likely represent mild congestive heart failure.   Electronically Signed   By: Ellery Plunk M.D.   On: 01/06/2015 21:20   Dg Chest Port 1 View  01/08/2015   CLINICAL DATA:  Pacemaker placement  EXAM: PORTABLE CHEST - 1 VIEW  COMPARISON:  01/06/2015  FINDINGS: Left chest wall pacemaker is in place with leads in the right atrium and right ventricle. No pneumothorax. Heart is normal size. Aortic arch atherosclerosis/ calcifications. Lungs are clear. No effusions. No acute bony abnormality.  IMPRESSION: Left pacer placement without pneumothorax.  Aortic arch  atherosclerosis.   Electronically Signed   By: Charlett Nose M.D.   On: 01/08/2015 07:36    ASSESSMENT AND PLAN:  Active Problems:   AV block PMVT secondary to heart block S/p PPM, with normal device function Rec: patient is stable for discharge. I would like her to go home on Atenolol 12.5 mg twice daily. Usual PM followup with device clinic in 1-2 weeks and with me in 3 months.  Lewayne Bunting, MD 01/08/2015 8:35 AM

## 2015-01-08 NOTE — Plan of Care (Signed)
Problem: Phase II Progression Outcomes Goal: Pain controlled Outcome: Completed/Met Date Met:  01/08/15 See MAR for regarding pain med administration

## 2015-01-08 NOTE — Progress Notes (Signed)
Patient discharged home with husband via car.  Discharge instructions given to patient, husband and daughter.  All questions and concerns addressed and answered.  Telemetry discontinued.  PIV discontinued with pressure dressing at site.  Left Subclavian PPM site open to air with Steri-Strips still in place per MD request.  Patient awake, alert and oriented at time of discharge.  All belongings and valuables given to family.  CCMD and E-Link made aware of discharge.

## 2015-01-12 MED FILL — Heparin Sodium (Porcine) 2 Unit/ML in Sodium Chloride 0.9%: INTRAMUSCULAR | Qty: 500 | Status: AC

## 2015-01-13 ENCOUNTER — Telehealth: Payer: Self-pay | Admitting: Internal Medicine

## 2015-01-13 NOTE — Telephone Encounter (Signed)
New problem   Pt had pacer implant last Friday and she is now having bruising at site. Please call pt.

## 2015-01-14 NOTE — Telephone Encounter (Signed)
Patient called asking if the ecchymosis after a pacemaker implant was normal. I informed her that it is not uncommon to see this after a procedure especially if an anticoagulant or ASA is taken. Patient states that it was like this upon discharge from the hospital as well--no new ecchymosis. Patient also states that she does notice some pain--taking Tylenol q6hours for relief. Plan to follow up w/ the Device Clinic on 7/11 for wound check appt as scheduled.

## 2015-01-17 ENCOUNTER — Ambulatory Visit (INDEPENDENT_AMBULATORY_CARE_PROVIDER_SITE_OTHER): Payer: Medicare Other | Admitting: *Deleted

## 2015-01-17 DIAGNOSIS — I4430 Unspecified atrioventricular block: Secondary | ICD-10-CM | POA: Diagnosis not present

## 2015-01-17 LAB — CUP PACEART INCLINIC DEVICE CHECK
Battery Remaining Longevity: 102 mo
Battery Voltage: 3.1 V
Brady Statistic RA Percent Paced: 46 %
Date Time Interrogation Session: 20160711172004
Lead Channel Impedance Value: 562.5 Ohm
Lead Channel Pacing Threshold Amplitude: 0.75 V
Lead Channel Setting Pacing Amplitude: 1.375
Lead Channel Setting Pacing Amplitude: 3.5 V
MDC IDC MSMT LEADCHNL RA IMPEDANCE VALUE: 425 Ohm
MDC IDC MSMT LEADCHNL RA PACING THRESHOLD PULSEWIDTH: 0.4 ms
MDC IDC MSMT LEADCHNL RA SENSING INTR AMPL: 3.5 mV
MDC IDC MSMT LEADCHNL RV PACING THRESHOLD AMPLITUDE: 1 V
MDC IDC MSMT LEADCHNL RV PACING THRESHOLD PULSEWIDTH: 0.4 ms
MDC IDC PG SERIAL: 7785935
MDC IDC SET LEADCHNL RV PACING PULSEWIDTH: 0.4 ms
MDC IDC SET LEADCHNL RV SENSING SENSITIVITY: 2 mV
MDC IDC STAT BRADY RV PERCENT PACED: 98 %
Pulse Gen Model: 2240

## 2015-01-17 NOTE — Progress Notes (Signed)
Wound check appointment. Steri-strips removed. Wound without redness or edema. Incision edges approximated, wound well healed. Normal device function. Thresholds, sensing, and impedances consistent with implant measurements. Device programmed at 3.5V/auto capture programmed on for extra safety margin until 3 month visit. Histogram distribution appropriate for patient and level of activity. No mode switches or high ventricular rates noted. Patient educated about wound care, arm mobility, lifting restrictions. ROV in 3 months with [facility]. 

## 2015-01-18 ENCOUNTER — Telehealth: Payer: Self-pay | Admitting: Cardiology

## 2015-01-18 NOTE — Telephone Encounter (Signed)
NewMessage  Pt calling about BP medication. Please call back and discuss.

## 2015-01-18 NOTE — Telephone Encounter (Signed)
Patient is concerned about HTN.  After pacemaker implantation, Dr. [name] started patient on atenolol, which she has been taking as directed since 7/2. Recent BP readings: 7/8:   174/78 7/9:   165/68 7/10: 154/78 7/11: 171/68 7/12: 162/69 Patient wants to know whether her BP medications need to be increased or another medication added. Patient also wants [name] to be aware of L arm/shoulder blade pain that occurred this afternoon. She applied Blue Emu ointment and it got better quickly. Instructed patient to call if an episode like this happens again.  To [name] for recommendations.  Patient requesting a call from Device Clinic as well  - she st she does not know how to set up her "box." To Device Clinic.

## 2015-01-19 MED ORDER — ATENOLOL 25 MG PO TABS: 25.0000 mg | ORAL_TABLET | Freq: Two times a day (BID) | ORAL | Status: DC

## 2015-01-19 NOTE — Telephone Encounter (Signed)
Instructed patient to INCREASE ATENOLOL to 1 tablet BID and to check BP daily for a week and call with results. Patient agrees with treatment plan.

## 2015-01-19 NOTE — Telephone Encounter (Signed)
Spoke with patient- she and her husband will try different outlets to troubleshoot remote monitor. Discussed pain around shoulder area- incision confirmed to be closed, without redness or swelling. Pain described sounds like arthritis- patient will continue tylenol/ emu ointment regimen and call back if pain worsens (she doesn't care for the idea of taking anything stronger for pain).

## 2015-01-19 NOTE — Telephone Encounter (Signed)
Increase atenolol to 25mg  1 tablet BID and check BP daily for a week and call with results

## 2015-01-21 ENCOUNTER — Encounter (HOSPITAL_COMMUNITY): Payer: Self-pay | Admitting: Emergency Medicine

## 2015-01-21 ENCOUNTER — Emergency Department (HOSPITAL_COMMUNITY)
Admission: EM | Admit: 2015-01-21 | Discharge: 2015-01-21 | Disposition: A | Discharge status: Home / Self Care | Payer: Medicare Other | Attending: Emergency Medicine | Admitting: Emergency Medicine

## 2015-01-21 ENCOUNTER — Emergency Department (HOSPITAL_COMMUNITY): Payer: Medicare Other

## 2015-01-21 ENCOUNTER — Emergency Department (HOSPITAL_BASED_OUTPATIENT_CLINIC_OR_DEPARTMENT_OTHER): Payer: Medicare Other

## 2015-01-21 DIAGNOSIS — R52 Pain, unspecified: Secondary | ICD-10-CM | POA: Diagnosis not present

## 2015-01-21 DIAGNOSIS — M79602 Pain in left arm: Secondary | ICD-10-CM

## 2015-01-21 DIAGNOSIS — Y998 Other external cause status: Secondary | ICD-10-CM | POA: Diagnosis not present

## 2015-01-21 DIAGNOSIS — E039 Hypothyroidism, unspecified: Secondary | ICD-10-CM | POA: Insufficient documentation

## 2015-01-21 DIAGNOSIS — S301XXA Contusion of abdominal wall, initial encounter: Secondary | ICD-10-CM

## 2015-01-21 DIAGNOSIS — T148XXA Other injury of unspecified body region, initial encounter: Secondary | ICD-10-CM

## 2015-01-21 DIAGNOSIS — Z79899 Other long term (current) drug therapy: Secondary | ICD-10-CM | POA: Diagnosis not present

## 2015-01-21 DIAGNOSIS — S20212A Contusion of left front wall of thorax, initial encounter: Secondary | ICD-10-CM | POA: Diagnosis not present

## 2015-01-21 DIAGNOSIS — Z7982 Long term (current) use of aspirin: Secondary | ICD-10-CM | POA: Insufficient documentation

## 2015-01-21 DIAGNOSIS — N183 Chronic kidney disease, stage 3 (moderate): Secondary | ICD-10-CM | POA: Insufficient documentation

## 2015-01-21 DIAGNOSIS — I129 Hypertensive chronic kidney disease with stage 1 through stage 4 chronic kidney disease, or unspecified chronic kidney disease: Secondary | ICD-10-CM | POA: Insufficient documentation

## 2015-01-21 DIAGNOSIS — Y939 Activity, unspecified: Secondary | ICD-10-CM | POA: Insufficient documentation

## 2015-01-21 DIAGNOSIS — R609 Edema, unspecified: Secondary | ICD-10-CM

## 2015-01-21 DIAGNOSIS — S40021A Contusion of right upper arm, initial encounter: Secondary | ICD-10-CM | POA: Diagnosis not present

## 2015-01-21 DIAGNOSIS — S3992XA Unspecified injury of lower back, initial encounter: Secondary | ICD-10-CM | POA: Insufficient documentation

## 2015-01-21 DIAGNOSIS — Y9289 Other specified places as the place of occurrence of the external cause: Secondary | ICD-10-CM | POA: Insufficient documentation

## 2015-01-21 DIAGNOSIS — Z87891 Personal history of nicotine dependence: Secondary | ICD-10-CM | POA: Diagnosis not present

## 2015-01-21 DIAGNOSIS — Z950 Presence of cardiac pacemaker: Secondary | ICD-10-CM | POA: Diagnosis not present

## 2015-01-21 DIAGNOSIS — Z862 Personal history of diseases of the blood and blood-forming organs and certain disorders involving the immune mechanism: Secondary | ICD-10-CM | POA: Diagnosis not present

## 2015-01-21 DIAGNOSIS — X58XXXA Exposure to other specified factors, initial encounter: Secondary | ICD-10-CM | POA: Insufficient documentation

## 2015-01-21 DIAGNOSIS — S4991XA Unspecified injury of right shoulder and upper arm, initial encounter: Secondary | ICD-10-CM | POA: Diagnosis present

## 2015-01-21 DIAGNOSIS — S40022A Contusion of left upper arm, initial encounter: Secondary | ICD-10-CM | POA: Insufficient documentation

## 2015-01-21 LAB — BASIC METABOLIC PANEL
ANION GAP: 9 (ref 5–15)
BUN: 24 mg/dL — AB (ref 6–20)
CHLORIDE: 104 mmol/L (ref 101–111)
CO2: 26 mmol/L (ref 22–32)
Calcium: 9.3 mg/dL (ref 8.9–10.3)
Creatinine, Ser: 1.4 mg/dL — ABNORMAL HIGH (ref 0.44–1.00)
GFR calc Af Amer: 41 mL/min — ABNORMAL LOW (ref >60)
GFR calc non Af Amer: 36 mL/min — ABNORMAL LOW (ref >60)
Glucose, Bld: 105 mg/dL — ABNORMAL HIGH (ref 65–99)
Potassium: 3.5 mmol/L (ref 3.5–5.1)
SODIUM: 139 mmol/L (ref 135–145)

## 2015-01-21 LAB — CBC
HCT: 31.5 % — ABNORMAL LOW (ref 36.0–46.0)
Hemoglobin: 10.2 g/dL — ABNORMAL LOW (ref 12.0–15.0)
MCH: 30.4 pg (ref 26.0–34.0)
MCHC: 32.4 g/dL (ref 30.0–36.0)
MCV: 93.8 fL (ref 78.0–100.0)
PLATELETS: 234 10*3/uL (ref 150–400)
RBC: 3.36 MIL/uL — AB (ref 3.87–5.11)
RDW: 14.4 % (ref 11.5–15.5)
WBC: 12.3 10*3/uL — ABNORMAL HIGH (ref 4.0–10.5)

## 2015-01-21 MED ORDER — FENTANYL CITRATE (PF) 100 MCG/2ML IJ SOLN
25.0000 ug | INTRAMUSCULAR | Status: DC | PRN
  Administered 2015-01-21: 25 ug via NASAL
  Filled 2015-01-21: qty 2

## 2015-01-21 MED ORDER — HYDROCODONE-ACETAMINOPHEN 5-325 MG PO TABS
1.0000 | ORAL_TABLET | Freq: Once | ORAL | Status: AC
  Administered 2015-01-21: 1 via ORAL
  Filled 2015-01-21: qty 1

## 2015-01-21 MED ORDER — TRAMADOL HCL 50 MG PO TABS: 50.0000 mg | ORAL_TABLET | Freq: Four times a day (QID) | ORAL | Status: DC | PRN

## 2015-01-21 NOTE — ED Provider Notes (Signed)
CSN: [id number]     Arrival date & time 01/21/15  0112 History   This chart was scribed for Amanda Albe, MD by Evon Slack, ED Scribe. This patient was seen in room A13C/A13C and the patient's care was started at 2:35 AM.     Chief Complaint  Patient presents with  . Arm Pain  . Back Pain   Patient is a 74 y.o. female presenting with arm pain and back pain. The history is provided by the patient. No language interpreter was used.  Arm Pain Pertinent negatives include no shortness of breath.  Back Pain Associated symptoms: no fever    HPI Comments: Amanda [name] is a 74 y.o. female with PMHx listed below who presents to the Emergency Department complaining of intermittent aching left arm pain onset 2 nights prior. Pt states that she has stabbing left shoulder blade pain as well. She states that she feels as the pain is deep in her bone. Pt states that she has tried tylenol with no relief. Pt states that the pain is causing her not to be able to sleep tonight. She states the pain tonight started about 9 PM. She states however she did not have the pain the night before. Nothing she does makes the pain hurt more, nothing she does makes it hurt less. Pt does report slight cough but states that it is not out of the ordinary for her to cough due to allergies. Pt does report having a St  Jude pacemaker placed 2 weeks prior. She sates that she had associated bruising. She called her cardiologist's office because her blood pressure had been in the 170 range in her atenolol was increased yesterday to 25 mg twice a day from 12 1/2 mg twice a day. She states that all her other medications are the same. Denies CP, new SOB, sore throat, fever, leg swelling, nausea or vomiting. She denies chest pain, fever, diaphoresis, swelling in her legs, nausea, or vomiting. She has been taking Tylenol 500 mg 4 times a day for the pain.  PCP Dr [facility] Dr Kym Groom  Past Medical History  Diagnosis Date  .  Allergic rhinitis   . Hyperparathyroidism   . Seasonal allergies   . DJD (degenerative joint disease)   . MVP (mitral valve prolapse)     moderate posterior MVP with moderate MR and grade II diasotlic dysfunction  . Hypothyroidism   . Hyperlipidemia   . Anemia   . Small vessel disease, cerebrovascular   . Chronic kidney disease     stage III  . IBS (irritable bowel syndrome)   . Glomerulonephritis     Dr [name]  . PAC (premature atrial contraction)   . Hypokalemia   . Interstitial cystitis   . Hypertension    Past Surgical History  Procedure Laterality Date  . Abdominal hysterectomy    . Breast biopsy Right   . Tonsillectomy    . Ep implantable device N/A 01/07/2015    Procedure: Pacemaker Implant;  Surgeon: Marinus Maw, MD;  Location: [facility] INVASIVE CV LAB;  Service: Cardiovascular;  Laterality: N/A;  . Pacemaker placement     Family History  Problem Relation Age of Onset  . Ovarian cancer Sister   . Colon cancer Neg Hx   . Heart disease Mother   . Heart disease Father   . Heart disease Brother   . Rheum arthritis Sister   . Melanoma Brother   . Brain cancer Brother  History  Substance Use Topics  . Smoking status: Former Games developer  . Smokeless tobacco: Never Used  . Alcohol Use: No   Lives at home Lives with spouse  OB History    No data available      Review of Systems  Constitutional: Negative for fever.  HENT: Negative for sore throat.   Respiratory: Negative for cough and shortness of breath.   Cardiovascular: Negative for leg swelling.  Gastrointestinal: Negative for nausea and vomiting.  Musculoskeletal: Positive for back pain and arthralgias.  Skin: Positive for color change.  All other systems reviewed and are negative.    Allergies  Amlodipine; Contrast media; Pepcid; Prilosec; Cephalexin; Ciprofloxacin; Erythromycin; Sulfonamide derivatives; and Tetracycline  Home Medications   Prior to Admission medications   Medication Sig Start  Date End Date Taking? Authorizing Provider  aspirin 325 MG tablet Take 325 mg by mouth daily.   Yes Historical Provider, MD  atenolol (TENORMIN) 25 MG tablet Take 1 tablet (25 mg total) by mouth 2 (two) times daily. 01/19/15  Yes Quintella Reichert, MD  calcitRIOL (ROCALTROL) 0.25 MCG capsule Take 0.25 mcg by mouth every 3 (three) days.    Yes Historical Provider, MD  Cyanocobalamin (VITAMIN B-12) 1000 MCG SUBL Place 1,000 mcg under the tongue daily.    Yes Historical Provider, MD  esomeprazole (NEXIUM) 20 MG capsule Take 1 capsule (20 mg total) by mouth daily at 12 noon. 01/19/14  Yes Hart Carwin, MD  fexofenadine (ALLEGRA) 180 MG tablet Take 180 mg by mouth daily.   Yes Historical Provider, MD  fluticasone (FLONASE) 50 MCG/ACT nasal spray Place 2 sprays into both nostrils as needed for allergies or rhinitis.   Yes Historical Provider, MD  hydrocortisone valerate ointment (WEST-CORT) 0.2 % Apply 1 application topically daily as needed (dermatitis).    Yes Historical Provider, MD  levothyroxine (SYNTHROID, LEVOTHROID) 75 MCG tablet Take 75 mcg by mouth daily at 6 PM.    Yes Historical Provider, MD  Misc Natural Products (TART CHERRY ADVANCED PO) Take 1 tablet by mouth daily.    Yes Historical Provider, MD  naproxen sodium (ANAPROX) 220 MG tablet Take 220 mg by mouth 2 (two) times daily as needed (pain).   Yes Historical Provider, MD  Probiotic Product (PROBIOTIC DAILY PO) Take 1 tablet by mouth daily.   Yes Historical Provider, MD   BP 185/68 mmHg  Pulse 84  Temp(Src) 97.9 F (36.6 C) (Oral)  Resp 19  Wt 142 lb 9.6 oz (64.683 kg)  SpO2 100%  Vital signs normal except for hypertension    Physical Exam  Constitutional: She is oriented to person, place, and time. She appears well-developed and well-nourished.  Non-toxic appearance. She does not appear ill. No distress.  HENT:  Head: Normocephalic and atraumatic.  Right Ear: External ear normal.  Left Ear: External ear normal.  Nose: Nose  normal. No mucosal edema or rhinorrhea.  Mouth/Throat: Oropharynx is clear and moist and mucous membranes are normal. No dental abscesses or uvula swelling.  Eyes: Conjunctivae and EOM are normal. Pupils are equal, round, and reactive to light.  conjunctiva pale.   Neck: Normal range of motion and full passive range of motion without pain. Neck supple.  Cardiovascular: Normal rate, regular rhythm and normal heart sounds.  Exam reveals no gallop and no friction rub.   No murmur heard. Pulmonary/Chest: Effort normal and breath sounds normal. No respiratory distress. She has no wheezes. She has no rhonchi. She has no rales. She exhibits  no tenderness and no crepitus.  Pacemaker insertion site well healed with mild swelling no redness.   Abdominal: Soft. Normal appearance and bowel sounds are normal. She exhibits no distension. There is no tenderness. There is no rebound and no guarding.  Musculoskeletal: Normal range of motion. She exhibits no edema or tenderness.  Moves all extremities well. Patient is noted to have moderate bruising of her left upper extremity on the volar aspect that extends down her left lateral chest wall and her lateral abdomen. Please see photographs  Neurological: She is alert and oriented to person, place, and time. She has normal strength. No cranial nerve deficit.  Skin: Skin is warm, dry and intact. No rash noted. No erythema. There is pallor.  Psychiatric: She has a normal mood and affect. Her speech is normal and behavior is normal. Her mood appears not anxious.  Nursing note and vitals reviewed.        ED Course  Procedures (including critical care time)  Medications  fentaNYL (SUBLIMAZE) injection 25 mcg (not administered)  HYDROcodone-acetaminophen (NORCO/VICODIN) 5-325 MG per tablet 1 tablet (1 tablet Oral Given 01/21/15 0409)    DIAGNOSTIC STUDIES: Oxygen Saturation is 98% on RA, normal by my interpretation.    COORDINATION OF CARE: 2:47 AM-Discussed  treatment plan with pt at bedside and pt agreed to plan.   3:45 AM- Pt states that her pain has returned. Norco was ordered for pain  The discussed patient's lab results including that her hemoglobin has dropped 2 g since her visit last week. However this point she does not require blood transfusion. She is willing to stay and have a Doppler ultrasound done to make sure she does not have DVT in her arm. She states the Norco made her pain tolerable so she could sleep.  Nurses report patient now states she cannot tolerate her pain although I had just been in the room 20 minutes ago and she said it was better. She was given fentanyl nasally.  Pt signed out to Dr Denton Lank at change of shift to get doppler US results.    Labs Review  CBC normal except for white count 12.3 thousand, hemoglobin low at 10.2 (in the hospital last week her hemoglobin was 12.5, one year ago was 13.5)  BASIC metabolic panel normal except for glucose 105, BUN slightly elevated at 24, creatinine slightly elevated at 1.4, GFR low at 36 which is stable  Troponin is normal at 0.01  Imaging Review Dg Chest 2 View  01/21/2015   CLINICAL DATA:  Recent pacemaker placement. Left arm pain and scapular pain with bruising all over the arm and left side of the body.  EXAM: CHEST  2 VIEW  COMPARISON:  01/08/2015  FINDINGS: Cardiac pacemaker with lead tips over the RA and RV regions. No change in positioning since previous study. Hyperinflation in the lungs. Central interstitial changes with bronchial wall thickening suggesting chronic bronchitis. Hazy opacity in the left lung base posteriorly obscuring the left posterior hemidiaphragm may represent early infiltration or atelectasis. No pneumothorax. Heart size and pulmonary vascularity are normal. Calcification of the aorta.  IMPRESSION: Emphysematous and chronic bronchitic changes in the lungs. Infiltration or atelectasis in the left lung base posteriorly.   Electronically Signed   By:  Burman Nieves M.D.   On: 01/21/2015 01:48     EKG Interpretation   Date/Time:  Friday January 21 2015 01:19:09 EDT Ventricular Rate:  60 PR Interval:  184 QRS Duration: 164 QT Interval:  494 QTC Calculation:  494 R Axis:   -74 Text Interpretation:  AV dual-paced rhythm No significant change since  last tracing 08 Jan 2015 Confirmed by Hamsa Laurich  MD-I, Annick Dimaio ([id number]) on  01/21/2015 2:31:44 AM      MDM   Final diagnoses:  Swelling  Pain  Pain of left upper extremity  Superficial bruising of arm, right, initial encounter  Arm bruise, left, initial encounter  Superficial bruising of abdominal wall, initial encounter  Superficial bruising of chest wall, left, initial encounter   Disposition pending  Amanda Albe, MD, FACEP   I personally performed the services described in this documentation, which was scribed in my presence. The recorded information has been reviewed and considered.  Amanda Albe, MD, Concha Pyo, MD 01/21/15 [phone]

## 2015-01-21 NOTE — Progress Notes (Addendum)
Left upper extremity venous duplex completed:  DVT noted in the left subclavian vein.  No evidence of superficial thrombosis.  A non vascularized, anechoic structure with mixed internal echoes of unknown etiology noted in the left shoulder.

## 2015-01-21 NOTE — ED Notes (Signed)
Patient transported to vascular without distress; pain improved.

## 2015-01-21 NOTE — ED Provider Notes (Addendum)
Signed out by Dr Lynelle Doctor to d/c to home if/when vascular doppler neg.  Pt vascular doppler neg for dvt, ?c/w hematoma.   Pt appears to have developed hematoma post pacemaker - pt indicates the initially swelling in region left shoulder/axilla has slowly improved since, bruising to left side persists. No faintness or dizziness. No fever/chills.   Discussed with Dr Bruna Potter APP-  They will see in the office this Monday.  Pt requests pain rx for home, for nighttime.       Cathren Laine, MD 01/21/15 1011

## 2015-01-21 NOTE — ED Notes (Signed)
Pt updated that pt is waiting for vascular study; notified open at 0830.

## 2015-01-21 NOTE — ED Notes (Signed)
PT ambulated with baseline gait; VSS; A&Ox3; no signs of distress; respirations even and unlabored; skin warm and dry; no questions upon discharge.  

## 2015-01-21 NOTE — Discharge Instructions (Signed)
It was our pleasure to provide your ER care today - we hope that you feel better.  You may take ultram as need for pain - may make drowsy, no driving when taking.  Follow up with your doctor Monday.  Return to ER if worse, new symptoms, fevers, trouble breathing, other concern.    Hematoma A hematoma is a collection of blood under the skin, in an organ, in a body space, in a joint space, or in other tissue. The blood can clot to form a lump that you can see and feel. The lump is often firm and may sometimes become sore and tender. Most hematomas get better in a few days to weeks. However, some hematomas may be serious and require medical care. Hematomas can range in size from very small to very large. CAUSES  A hematoma can be caused by a blunt or penetrating injury. It can also be caused by spontaneous leakage from a blood vessel under the skin. Spontaneous leakage from a blood vessel is more likely to occur in older people, especially those taking blood thinners. Sometimes, a hematoma can develop after certain medical procedures. SIGNS AND SYMPTOMS   A firm lump on the body.  Possible pain and tenderness in the area.  Bruising.Blue, dark blue, purple-red, or yellowish skin may appear at the site of the hematoma if the hematoma is close to the surface of the skin. For hematomas in deeper tissues or body spaces, the signs and symptoms may be subtle. For example, an intra-abdominal hematoma may cause abdominal pain, weakness, fainting, and shortness of breath. An intracranial hematoma may cause a headache or symptoms such as weakness, trouble speaking, or a change in consciousness. DIAGNOSIS  A hematoma can usually be diagnosed based on your medical history and a physical exam. Imaging tests may be needed if your health care provider suspects a hematoma in deeper tissues or body spaces, such as the abdomen, head, or chest. These tests may include ultrasonography or a CT scan.  TREATMENT    Hematomas usually go away on their own over time. Rarely does the blood need to be drained out of the body. Large hematomas or those that may affect vital organs will sometimes need surgical drainage or monitoring. HOME CARE INSTRUCTIONS   Apply ice to the injured area:   Put ice in a plastic bag.   Place a towel between your skin and the bag.   Leave the ice on for 20 minutes, 2-3 times a day for the first 1 to 2 days.   After the first 2 days, switch to using warm compresses on the hematoma.   Elevate the injured area to help decrease pain and swelling. Wrapping the area with an elastic bandage may also be helpful. Compression helps to reduce swelling and promotes shrinking of the hematoma. Make sure the bandage is not wrapped too tight.   If your hematoma is on a lower extremity and is painful, crutches may be helpful for a couple days.   Only take over-the-counter or prescription medicines as directed by your health care provider. SEEK IMMEDIATE MEDICAL CARE IF:   You have increasing pain, or your pain is not controlled with medicine.   You have a fever.   You have worsening swelling or discoloration.   Your skin over the hematoma breaks or starts bleeding.   Your hematoma is in your chest or abdomen and you have weakness, shortness of breath, or a change in consciousness.  Your hematoma is on  your scalp (caused by a fall or injury) and you have a worsening headache or a change in alertness or consciousness. MAKE SURE YOU:   Understand these instructions.  Will watch your condition.  Will get help right away if you are not doing well or get worse. Document Released: 02/07/2004 Document Revised: 02/25/2013 Document Reviewed: 12/03/2012 [facility] Patient Information 2015 [place], [place]. This information is not intended to replace advice given to you by your health care provider. Make sure you discuss any questions you have with your health care provider.

## 2015-01-21 NOTE — ED Notes (Signed)
MD at bedside. 

## 2015-01-21 NOTE — ED Notes (Signed)
Pt. Ambulating with no sob/dizziness. Pt. States little pain in her left armpit and upper bicep area but no other pain or discomfort. Sat maintained 99-100%.

## 2015-01-21 NOTE — ED Notes (Addendum)
Pt. reports left arm and left upper back pain onset this evening , denies injury , no chest pain or SOB . Hypertensive at triage . Pacemaker insertion 2 weeks ago .

## 2015-01-21 NOTE — ED Notes (Signed)
ISTAT results given to Dr. Knapp. 

## 2015-01-21 NOTE — ED Notes (Signed)
PT returned from vascular

## 2015-01-21 NOTE — ED Notes (Signed)
Per MD verbal; hold on starting IV. Pt reporting 8/10 pain. New orders obtained.

## 2015-01-24 ENCOUNTER — Ambulatory Visit (INDEPENDENT_AMBULATORY_CARE_PROVIDER_SITE_OTHER): Payer: Self-pay | Admitting: Nurse Practitioner

## 2015-01-24 ENCOUNTER — Encounter: Payer: Self-pay | Admitting: Nurse Practitioner

## 2015-01-24 VITALS — BP 170/60 | HR 71 | Ht 67.0 in | Wt 142.0 lb

## 2015-01-24 DIAGNOSIS — I441 Atrioventricular block, second degree: Secondary | ICD-10-CM

## 2015-01-24 DIAGNOSIS — I10 Essential (primary) hypertension: Secondary | ICD-10-CM

## 2015-01-24 LAB — I-STAT TROPONIN, ED: TROPONIN I, POC: 0.01 ng/mL (ref 0.00–0.08)

## 2015-01-24 NOTE — Patient Instructions (Signed)
Medication Instructions:   Your physician recommends that you continue on your current medications as directed. Please refer to the Current Medication list given to you today.    Labwork:   Testing/Procedures:   Follow-Up:   Any Other Special Instructions Will Be Listed Below (If Applicable).  PLEASE CONTACT OFFICE IF BLOOD PRESSURE RUNS GREATER THAN  150/90

## 2015-01-24 NOTE — Progress Notes (Signed)
Electrophysiology Office Note Date: 01/24/2015  ID:  Amanda [name], DOB [birth year], MRN [id number]  PCP: [name], MD Primary Cardiologist: [name] Electrophysiologist: [name]  CC: Follow up for pacemaker hematoma  Amanda [name] is a 74 y.o. female seen today for Dr [name].  She underwent PPM implantation earlier this month after presenting with symptomatic 2:1 heart block. She was seen in clinic for wound check at which time her wound was documented to be normal.  She presented to the ER 01/21/15 with complaints of pacemaker pocket hematoma and bruising.  Upper extremity doppler negative for DVT.  She was discharged home and presents today for re-evaluation.  Since discharge from the ER, the patient reports doing reasonably well.  She denies chest pain, palpitations, dyspnea, PND, orthopnea, nausea, vomiting, dizziness, syncope.  She has persistent but improving left arm discomfort.   Device History: STJ dual chamber PPM implanted 2016 for 2:1 heart block   Past Medical History  Diagnosis Date  . Allergic rhinitis   . Hyperparathyroidism   . DJD (degenerative joint disease)   . MVP (mitral valve prolapse)     moderate posterior MVP with moderate MR and grade II diasotlic dysfunction  . Hypothyroidism   . Hyperlipidemia   . Anemia   . Small vessel disease, cerebrovascular   . Chronic kidney disease     stage III  . IBS (irritable bowel syndrome)   . Glomerulonephritis     Dr [name]  . PAC (premature atrial contraction)   . Interstitial cystitis   . Hypertension   . Mobitz II     a. s/p STJ dual chamber PPM    Past Surgical History  Procedure Laterality Date  . Abdominal hysterectomy    . Breast biopsy Right   . Tonsillectomy    . Ep implantable device N/A 01/07/2015    STJ dual chamber pacemaker implanted by Dr [name] for 2:1 heart block    Current Outpatient Prescriptions  Medication Sig Dispense Refill  . aspirin 325 MG tablet Take 325 mg by mouth  daily.    [name] atenolol (TENORMIN) 25 MG tablet Take 1 tablet (25 mg total) by mouth 2 (two) times daily. 60 tablet 5  . calcitRIOL (ROCALTROL) 0.25 MCG capsule Take 0.25 mcg by mouth every 3 (three) days.     . Cyanocobalamin (VITAMIN B-12) 1000 MCG SUBL Place 1,000 mcg under the tongue daily.     [name] esomeprazole (NEXIUM) 20 MG capsule Take 1 capsule (20 mg total) by mouth daily at 12 noon. 3 capsule 2  . fexofenadine (ALLEGRA) 180 MG tablet Take 180 mg by mouth daily.    . fluticasone (FLONASE) 50 MCG/ACT nasal spray Place 2 sprays into both nostrils as needed for allergies or rhinitis.    . hydrocortisone valerate ointment (WEST-CORT) 0.2 % Apply 1 application topically daily as needed (dermatitis).     [name] levothyroxine (SYNTHROID, LEVOTHROID) 75 MCG tablet Take 75 mcg by mouth daily at 6 PM.     . Misc Natural Products (TART CHERRY ADVANCED PO) Take 1 tablet by mouth daily.     . naproxen sodium (ANAPROX) 220 MG tablet Take 220 mg by mouth 2 (two) times daily as needed (pain).    . Probiotic Product (PROBIOTIC DAILY PO) Take 1 tablet by mouth daily.    . traMADol (ULTRAM) 50 MG tablet Take 1 tablet (50 mg total) by mouth every 6 (six) hours as needed. 20 tablet 0   No current facility-administered medications for this  visit.    Allergies:   Amlodipine; Contrast media; Pepcid; Prilosec; Cephalexin; Ciprofloxacin; Erythromycin; Sulfonamide derivatives; and Tetracycline   Social History: History   Social History  . Marital Status: Married    Spouse Name: N/A  . Number of Children: 2  . Years of Education: N/A   Occupational History  . retired    Social History Main Topics  . Smoking status: Former Games developer  . Smokeless tobacco: Never Used  . Alcohol Use: No  . Drug Use: No  . Sexual Activity: Not on file   Other Topics Concern  . Not on file   Social History Narrative    Family History: Family History  Problem Relation Age of Onset  . Ovarian cancer Sister   . Colon cancer  Neg Hx   . Heart disease Mother   . Heart disease Father   . Heart disease Brother   . Rheum arthritis Sister   . Melanoma Brother   . Brain cancer Brother   . Heart attack Brother   . Heart attack Father   . Stroke Mother   . Hypertension Mother   . Hypertension Brother   . Hypertension Sister      Review of Systems: All other systems reviewed and are otherwise negative except as noted above.   Physical Exam: VS:  BP 170/60 mmHg  Pulse 71  Ht 5\' 7"  (1.702 m)  Wt 142 lb (64.411 kg)  BMI 22.24 kg/m2  SpO2 97% , BMI Body mass index is 22.24 kg/(m^2).  GEN- The patient is elderly appearing, alert and oriented x 3 today.   HEENT: normocephalic, atraumatic; sclera clear, conjunctiva pink; hearing intact; oropharynx clear; neck supple  Lungs- Clear to ausculation bilaterally, normal work of breathing.  No wheezes, rales, rhonchi Heart- Regular rate and rhythm, no murmurs, rubs or gallops  GI- soft, non-tender, non-distended, bowel sounds present  Extremities- no clubbing, cyanosis, or edema; DP/PT/radial pulses 2+ bilaterally MS- no significant deformity or atrophy Skin- warm and dry, no rash or lesion; PPM pocket well healed; diffuse ecchymosis across left lateral trunk Psych- euthymic mood, full affect Neuro- strength and sensation are intact  EKG:  EKG is not ordered today.  Recent Labs: 01/06/2015: B Natriuretic Peptide 639.0*; Magnesium 2.0; TSH 3.787 01/21/2015: BUN 24*; Creatinine, Ser 1.40*; Hemoglobin 10.2*; Platelets 234; Potassium 3.5; Sodium 139   Wt Readings from Last 3 Encounters:  01/24/15 142 lb (64.411 kg)  01/21/15 142 lb 9.6 oz (64.683 kg)  01/06/15 139 lb 15.9 oz (63.5 kg)     Other studies Reviewed: Additional studies/ records that were reviewed today include: hospital records  Assessment and Plan:  1.  Symptomatic 2:1 heart block The patient has developed small pacemaker pocket hematoma with ecchymosis. This is resolving.  Patient reassured.  No  changes today.  Follow up with Dr [name] as scheduled 10/16 Normal PPM function at wound check  2.  HTN Blood pressure elevated today She has just recently restarted Atenolol If remains elevated, would increase - she will monitor BP at home and call back with readings later this week   Current medicines are reviewed at length with the patient today.   The patient does not have concerns regarding her medicines.  The following changes were made today:  none  Labs/ tests ordered today include: none   Disposition:   Follow up with Dr [name] 3 months   Signed, Gypsy Balsam, NP 01/24/2015 12:47 PM  [facility] [address] [place]   [id number] [phone] (office) [phone] (fax)

## 2015-01-25 ENCOUNTER — Telehealth: Payer: Self-pay | Admitting: Nurse Practitioner

## 2015-01-25 NOTE — Telephone Encounter (Signed)
   Pt called to report that her left great toe is swollen and she thinks that she may have gout.  She attributes this to being on atenolol and has asked if she can be switched back to amiloride-hctz.  This was prev discontinued 2/2 CKD.  I advised that it was unlikely that atenolol was causing gout but that if her foot is hurting, she may take one dose of aleve this evening.  She has previously been told by her nephrologist not to take more than 3 doses of aleve/week.  Further, she should be evaluated for possible gout by her PCP, as she may get quicker relief with Rx medication.  Her PCP's office has an after hours walk-in and she plans to go tonight.  She will discuss possibly changing from atenolol with them, as she doesn't think that it's doing a good job with her BP.  Caller verbalized understanding and was grateful for the call back.  Nicolasa Ducking, NP 01/25/2015, 6:31 PM

## 2015-01-26 ENCOUNTER — Telehealth: Payer: Self-pay | Admitting: Cardiology

## 2015-01-26 MED ORDER — ATENOLOL 50 MG PO TABS: 50.0000 mg | ORAL_TABLET | Freq: Two times a day (BID) | ORAL | Status: DC

## 2015-01-26 NOTE — Addendum Note (Signed)
Addended by: Gunnar Fusi A on: 01/26/2015 02:33 PM   Modules accepted: Orders

## 2015-01-26 NOTE — Telephone Encounter (Signed)
Patient already appears to be on 25mg  BID so increase to 50mg  BID

## 2015-01-26 NOTE — Telephone Encounter (Signed)
New message      Talk to Dr Norris Cross nurse-----Something about Ward Givens told her to call and talk to the nurse about her bp

## 2015-01-26 NOTE — Telephone Encounter (Signed)
Patient st she has gotten gout in her L great toe since starting atenolol. On top of giving her gout, she does not think the medication is controlling her BP anyway. Yesterday, her BP was 188/74. Today, she did not take her atenolol and her BP was 158/69. She st she will not take it anymore. She wants to restart her moduretic 5-50.  To [name].

## 2015-01-26 NOTE — Telephone Encounter (Signed)
Instructed patient to INCREASE ATENOLOL to 50 mg BID and to check BP daily for a week and call with results. Patient agrees with treatment plan.

## 2015-01-26 NOTE — Telephone Encounter (Signed)
Atenolol does not cause gout.  Cannot change back to Moduretic because it dose cause gout.  I would recommend increasing Atenolol to 50mg  daily and check BP and HR for 1 week and call with results.

## 2015-02-01 ENCOUNTER — Encounter: Payer: Self-pay | Admitting: Internal Medicine

## 2015-02-01 ENCOUNTER — Telehealth: Payer: Self-pay | Admitting: Internal Medicine

## 2015-02-01 ENCOUNTER — Ambulatory Visit (INDEPENDENT_AMBULATORY_CARE_PROVIDER_SITE_OTHER): Payer: Medicare Other | Admitting: Internal Medicine

## 2015-02-01 ENCOUNTER — Other Ambulatory Visit: Payer: Self-pay

## 2015-02-01 VITALS — BP 152/74 | HR 62 | Ht 67.0 in | Wt 133.2 lb

## 2015-02-01 DIAGNOSIS — Z01812 Encounter for preprocedural laboratory examination: Secondary | ICD-10-CM

## 2015-02-01 DIAGNOSIS — R58 Hemorrhage, not elsewhere classified: Secondary | ICD-10-CM | POA: Diagnosis not present

## 2015-02-01 LAB — CBC WITH DIFFERENTIAL/PLATELET
Basophils Absolute: 0 10*3/uL (ref 0.0–0.1)
Basophils Relative: 0.2 % (ref 0.0–3.0)
EOS ABS: 0.1 10*3/uL (ref 0.0–0.7)
EOS PCT: 0.7 % (ref 0.0–5.0)
HEMATOCRIT: 39.8 % (ref 36.0–46.0)
Hemoglobin: 13.2 g/dL (ref 12.0–15.0)
LYMPHS PCT: 14 % (ref 12.0–46.0)
Lymphs Abs: 2.7 10*3/uL (ref 0.7–4.0)
MCHC: 33.2 g/dL (ref 30.0–36.0)
MCV: 93.2 fl (ref 78.0–100.0)
MONO ABS: 1.4 10*3/uL — AB (ref 0.1–1.0)
Monocytes Relative: 7 % (ref 3.0–12.0)
NEUTROS PCT: 78.1 % — AB (ref 43.0–77.0)
Neutro Abs: 15.2 10*3/uL — ABNORMAL HIGH (ref 1.4–7.7)
Platelets: 402 10*3/uL — ABNORMAL HIGH (ref 150.0–400.0)
RBC: 4.27 Mil/uL (ref 3.87–5.11)
RDW: 15.3 % (ref 11.5–15.5)

## 2015-02-01 LAB — BASIC METABOLIC PANEL
BUN: 38 mg/dL — AB (ref 6–23)
CO2: 35 mEq/L — ABNORMAL HIGH (ref 19–32)
Calcium: 10.1 mg/dL (ref 8.4–10.5)
Chloride: 96 mEq/L (ref 96–112)
Creatinine, Ser: 1.34 mg/dL — ABNORMAL HIGH (ref 0.40–1.20)
GFR: 40.89 mL/min — ABNORMAL LOW (ref >60.00)
Glucose, Bld: 131 mg/dL — ABNORMAL HIGH (ref 70–99)
POTASSIUM: 3.3 meq/L — AB (ref 3.5–5.1)
SODIUM: 138 meq/L (ref 135–145)

## 2015-02-01 MED ORDER — DIPHENHYDRAMINE HCL 25 MG PO TABS
ORAL_TABLET | ORAL | Status: DC
Start: 2015-02-01 — End: 2015-04-28

## 2015-02-01 MED ORDER — PREDNISONE 10 MG PO TABS: ORAL_TABLET | ORAL | Status: DC

## 2015-02-01 MED ORDER — ASPIRIN EC 81 MG PO TBEC: 81.0000 mg | DELAYED_RELEASE_TABLET | Freq: Every day | ORAL | Status: DC

## 2015-02-01 NOTE — Telephone Encounter (Signed)
Patient states that she's having a pain in L arm w/radiation to back since ppm implant. She states that the pain is not constant, but when it does occur it's about a 9/10. She states that she was Rx'd tramadol while in hosp on 7/15- relief noted x 1 week then no relief. Patient has noticed that her arm feels better w/movement. Plan to follow up in the device clinic today (7/26) @ 1330.

## 2015-02-01 NOTE — Telephone Encounter (Signed)
New Message  No dot phrase to properly attach  Pt called states that she has pain down her back and arms. She states that no medication in helping. Pt states that it is coming from the pacer site. Pt requests a call back to discuss. It has been 4 weeks now and the pain is getting worse.

## 2015-02-01 NOTE — Patient Instructions (Addendum)
Medication Instructions:  Your physician has recommended you make the following change in your medication:  1) DECREASE Aspirin to 81 mg daily  Pre med instructions for Thursday's procedure:  - Take Benadryl  25 mg + Prednisone 60 mg at 6 pm 7/27.  - Take Benadryl  25 mg at 6 am on  7/28.  Labwork: Pre procedure labs today: BMET, CBCD  Testing/Procedures: Your physician has recommended you have a left arm venogram performed.  This is scheduled for 7/28.  Arrive at hospital at 12:00 pm.  You may eat breakfast morning of procedure. Please eat before 8 am.   You may take your morning medications. You will need to pre medicate before this procedure. Please see medication instructions above.    Follow-Up: Your physician wants you to follow-up in: 1 year with Dr. Graciela Husbands.  You will receive a reminder letter in the mail two months in advance. If you don't receive a letter, please call our office to schedule the follow-up appointment.   Any Other Special Instructions Will Be Listed Below (If Applicable). Thank you for choosing [facility]!!

## 2015-02-01 NOTE — Progress Notes (Signed)
    Patient Care Team: Wendy McNeill, MD as PCP - General (Family Medicine) Traci R Turner, MD as Consulting Physician (Cardiology)   HPI  Amanda [name] is a 74 y.o.  The patient seen because of a abnormal venous Doppler  Undertaken because of pain and swelling in her left arm area it was read as abnormal. She also had a significant hematoma on the left side including a large collection underneath her left axilla. She developed sharp pain with radiation down her left arm following the procedure. It has not resolved.  Past Medical History  Diagnosis Date  . Allergic rhinitis   . Hyperparathyroidism   . DJD (degenerative joint disease)   . MVP (mitral valve prolapse)     moderate posterior MVP with moderate MR and grade II diasotlic dysfunction  . Hypothyroidism   . Hyperlipidemia   . Anemia   . Small vessel disease, cerebrovascular   . Chronic kidney disease     stage III  . IBS (irritable bowel syndrome)   . Glomerulonephritis     Dr Deterding  . PAC (premature atrial contraction)   . Interstitial cystitis   . Hypertension   . Mobitz II     a. s/p STJ dual chamber PPM     Past Surgical History  Procedure Laterality Date  . Abdominal hysterectomy    . Breast biopsy Right   . Tonsillectomy    . Ep implantable device N/A 01/07/2015    STJ dual chamber pacemaker implanted by Dr Taylor for 2:1 heart block    Current Outpatient Prescriptions  Medication Sig Dispense Refill  . aspirin 325 MG tablet Take 325 mg by mouth daily.    . atenolol (TENORMIN) 50 MG tablet Take 1 tablet (50 mg total) by mouth 2 (two) times daily. 60 tablet 6  . calcitRIOL (ROCALTROL) 0.25 MCG capsule Take 0.25 mcg by mouth every 3 (three) days.     . Cyanocobalamin (VITAMIN B-12) 1000 MCG SUBL Place 1,000 mcg under the tongue daily.     . fexofenadine (ALLEGRA) 180 MG tablet Take 180 mg by mouth daily.    . fluticasone (FLONASE) 50 MCG/ACT nasal spray Place 2 sprays into both nostrils as  needed for allergies or rhinitis.    . hydrocortisone valerate ointment (WEST-CORT) 0.2 % Apply 1 application topically daily as needed (dermatitis).     . levothyroxine (SYNTHROID, LEVOTHROID) 75 MCG tablet Take 75 mcg by mouth daily at 6 PM.     . Misc Natural Products (TART CHERRY ADVANCED PO) Take 1 tablet by mouth daily.     . Probiotic Product (PROBIOTIC DAILY PO) Take 1 tablet by mouth daily.    . traMADol (ULTRAM) 50 MG tablet Take 1 tablet (50 mg total) by mouth every 6 (six) hours as needed. 20 tablet 0  . esomeprazole (NEXIUM) 20 MG capsule Take 1 capsule (20 mg total) by mouth daily at 12 noon. (Patient not taking: Reported on 02/01/2015) 3 capsule 2  . naproxen sodium (ANAPROX) 220 MG tablet Take 220 mg by mouth 2 (two) times daily as needed (pain).    . predniSONE (DELTASONE) 10 MG tablet Take 10 mg by mouth daily. Taper dosage 10 mg every day (60, 50, 40, 30, 20, 10mg)  0   No current facility-administered medications for this visit.    Allergies  Allergen Reactions  . Amlodipine     PEDAL EDEMA   . Contrast Media [Iodinated Diagnostic Agents] Hives    IVP   dye per patient  . Pepcid [Famotidine] Swelling    "Throat Swelling"   . Prilosec [Omeprazole] Nausea Only  . Cephalexin Rash  . Ciprofloxacin Rash  . Erythromycin Rash  . Sulfonamide Derivatives Rash  . Tetracycline Rash    Review of Systems negative except from HPI and PMH  Physical Exam BP 152/74 mmHg  Pulse 62  Ht 5\' 7"  (1.702 m)  Wt 133 lb 3.2 oz (60.419 kg)  BMI 20.86 kg/m2 Well developed and well nourished in no acute distress HENT normal E scleral and icterus clear Neck Supple JVP flat; carotids brisk and full Clear to ausculation   There is no significant swelling of the left upper extremity  There is not discolored hematoma along the left thorax *Regular rate and rhythm, no murmurs gallops or rub Soft with active bowel sounds No clubbing cyanosis   Edema Alert and oriented, grossly normal  motor and sensory function Skin Warm and Dry    Assessment and  Plan   I don't know that there is a venous thrombosis as suggested by the Doppler scan. On this is supposed to be specific albeit not all at sensitive test, the lack of peripheral stigmata may be question the accuracy of the test. She unfortunately has an allergy to contrast dye so she will require premedication with prednisone and  Benadryl. She is allergic to H2 blockers   we will decrease her aspirin 81 mg chest.   CBC comes back with WBC 19.5

## 2015-02-02 NOTE — Progress Notes (Signed)
Called patient and advised to aggressively push fluids today in preparation for procedure tomorrow (per Dr. Graciela Husbands).

## 2015-02-03 ENCOUNTER — Ambulatory Visit (HOSPITAL_COMMUNITY)
Admission: RE | Admit: 2015-02-03 | Discharge: 2015-02-03 | Disposition: A | Discharge status: Home / Self Care | Payer: Medicare Other | Source: Ambulatory Visit | Attending: Internal Medicine | Admitting: Internal Medicine

## 2015-02-03 ENCOUNTER — Encounter (HOSPITAL_COMMUNITY)
Admission: RE | Disposition: A | Discharge status: Home / Self Care | Payer: Self-pay | Source: Ambulatory Visit | Attending: Internal Medicine

## 2015-02-03 DIAGNOSIS — Z950 Presence of cardiac pacemaker: Secondary | ICD-10-CM | POA: Diagnosis not present

## 2015-02-03 DIAGNOSIS — D649 Anemia, unspecified: Secondary | ICD-10-CM | POA: Diagnosis not present

## 2015-02-03 DIAGNOSIS — M79602 Pain in left arm: Secondary | ICD-10-CM

## 2015-02-03 DIAGNOSIS — N059 Unspecified nephritic syndrome with unspecified morphologic changes: Secondary | ICD-10-CM | POA: Diagnosis not present

## 2015-02-03 DIAGNOSIS — Z91041 Radiographic dye allergy status: Secondary | ICD-10-CM | POA: Diagnosis not present

## 2015-02-03 DIAGNOSIS — I341 Nonrheumatic mitral (valve) prolapse: Secondary | ICD-10-CM | POA: Diagnosis not present

## 2015-02-03 DIAGNOSIS — Z7951 Long term (current) use of inhaled steroids: Secondary | ICD-10-CM | POA: Insufficient documentation

## 2015-02-03 DIAGNOSIS — Z7982 Long term (current) use of aspirin: Secondary | ICD-10-CM | POA: Diagnosis not present

## 2015-02-03 DIAGNOSIS — M1990 Unspecified osteoarthritis, unspecified site: Secondary | ICD-10-CM | POA: Insufficient documentation

## 2015-02-03 DIAGNOSIS — I441 Atrioventricular block, second degree: Secondary | ICD-10-CM | POA: Insufficient documentation

## 2015-02-03 DIAGNOSIS — K589 Irritable bowel syndrome without diarrhea: Secondary | ICD-10-CM | POA: Insufficient documentation

## 2015-02-03 DIAGNOSIS — I491 Atrial premature depolarization: Secondary | ICD-10-CM | POA: Diagnosis not present

## 2015-02-03 DIAGNOSIS — E213 Hyperparathyroidism, unspecified: Secondary | ICD-10-CM | POA: Diagnosis not present

## 2015-02-03 DIAGNOSIS — J309 Allergic rhinitis, unspecified: Secondary | ICD-10-CM | POA: Insufficient documentation

## 2015-02-03 DIAGNOSIS — M7989 Other specified soft tissue disorders: Secondary | ICD-10-CM | POA: Diagnosis not present

## 2015-02-03 DIAGNOSIS — I739 Peripheral vascular disease, unspecified: Secondary | ICD-10-CM | POA: Diagnosis not present

## 2015-02-03 DIAGNOSIS — E785 Hyperlipidemia, unspecified: Secondary | ICD-10-CM | POA: Insufficient documentation

## 2015-02-03 DIAGNOSIS — R9439 Abnormal result of other cardiovascular function study: Secondary | ICD-10-CM

## 2015-02-03 DIAGNOSIS — R58 Hemorrhage, not elsewhere classified: Secondary | ICD-10-CM

## 2015-02-03 DIAGNOSIS — N3010 Interstitial cystitis (chronic) without hematuria: Secondary | ICD-10-CM | POA: Insufficient documentation

## 2015-02-03 DIAGNOSIS — E039 Hypothyroidism, unspecified: Secondary | ICD-10-CM | POA: Diagnosis not present

## 2015-02-03 DIAGNOSIS — N183 Chronic kidney disease, stage 3 (moderate): Secondary | ICD-10-CM | POA: Diagnosis not present

## 2015-02-03 DIAGNOSIS — I129 Hypertensive chronic kidney disease with stage 1 through stage 4 chronic kidney disease, or unspecified chronic kidney disease: Secondary | ICD-10-CM | POA: Diagnosis not present

## 2015-02-03 DIAGNOSIS — Z881 Allergy status to other antibiotic agents status: Secondary | ICD-10-CM | POA: Insufficient documentation

## 2015-02-03 DIAGNOSIS — Z01812 Encounter for preprocedural laboratory examination: Secondary | ICD-10-CM

## 2015-02-03 HISTORY — PX: PERIPHERAL VASCULAR CATHETERIZATION: SHX172C

## 2015-02-03 LAB — POTASSIUM: Potassium: 3.5 mmol/L (ref 3.5–5.1)

## 2015-02-03 SURGERY — UPPER EXTREMITY VENOGRAPHY
Anesthesia: LOCAL

## 2015-02-03 MED ORDER — SODIUM CHLORIDE 0.9 % IV SOLN: INTRAVENOUS | Status: DC

## 2015-02-03 MED ORDER — IOHEXOL 350 MG/ML SOLN
INTRAVENOUS | Status: DC | PRN
  Administered 2015-02-03: 20 mL via INTRAVENOUS

## 2015-02-03 NOTE — Discharge Instructions (Signed)
Venogram, Care After °Refer to this sheet in the next few weeks. These instructions provide you with information on caring for yourself after your procedure. Your health care provider may also give you more specific instructions. Your treatment has been planned according to current medical practices, but problems sometimes occur. Call your health care provider if you have any problems or questions after your procedure. °WHAT TO EXPECT AFTER THE PROCEDURE °After your procedure, it is typical to have the following sensations: °· Mild discomfort at the catheter insertion site. °HOME CARE INSTRUCTIONS  °· Take all medicines exactly as directed. °· Follow any prescribed diet. °· Follow instructions regarding both rest and physical activity. °· Drink more fluids for the first several days after the procedure in order to help flush dye from your kidneys. °SEEK MEDICAL CARE IF: °· You develop a rash. °· You have fever not controlled by medicine. °SEEK IMMEDIATE MEDICAL CARE IF: °· There is pain, drainage, bleeding, redness, swelling, warmth or a red streak at the site of the IV tube. °· The extremity where your IV tube was placed becomes discolored, numb, or cool. °· You have difficulty breathing or shortness of breath. °· You develop chest pain. °· You have excessive dizziness or fainting. °Document Released: 04/15/2013 Document Revised: 06/30/2013 Document Reviewed: 04/15/2013 °[employer]® Patient Information ©2015 [employer]. This information is not intended to replace advice given to you by your health care provider. Make sure you discuss any questions you have with your health care provider. ° °

## 2015-02-03 NOTE — H&P (View-Only) (Signed)
Patient Care Team: [name], MD as PCP - General (Family Medicine) Quintella Reichert, MD as Consulting Physician (Cardiology)   HPI  Amanda [name] is a 74 y.o.  The patient seen because of a abnormal venous Doppler  Undertaken because of pain and swelling in her left arm area it was read as abnormal. She also had a significant hematoma on the left side including a large collection underneath her left axilla. She developed sharp pain with radiation down her left arm following the procedure. It has not resolved.  Past Medical History  Diagnosis Date  . Allergic rhinitis   . Hyperparathyroidism   . DJD (degenerative joint disease)   . MVP (mitral valve prolapse)     moderate posterior MVP with moderate MR and grade II diasotlic dysfunction  . Hypothyroidism   . Hyperlipidemia   . Anemia   . Small vessel disease, cerebrovascular   . Chronic kidney disease     stage III  . IBS (irritable bowel syndrome)   . Glomerulonephritis     Dr [name]  . PAC (premature atrial contraction)   . Interstitial cystitis   . Hypertension   . Mobitz II     a. s/p STJ dual chamber PPM     Past Surgical History  Procedure Laterality Date  . Abdominal hysterectomy    . Breast biopsy Right   . Tonsillectomy    . Ep implantable device N/A 01/07/2015    STJ dual chamber pacemaker implanted by Dr [name] for 2:1 heart block    Current Outpatient Prescriptions  Medication Sig Dispense Refill  . aspirin 325 MG tablet Take 325 mg by mouth daily.    [name] atenolol (TENORMIN) 50 MG tablet Take 1 tablet (50 mg total) by mouth 2 (two) times daily. 60 tablet 6  . calcitRIOL (ROCALTROL) 0.25 MCG capsule Take 0.25 mcg by mouth every 3 (three) days.     . Cyanocobalamin (VITAMIN B-12) 1000 MCG SUBL Place 1,000 mcg under the tongue daily.     . fexofenadine (ALLEGRA) 180 MG tablet Take 180 mg by mouth daily.    . fluticasone (FLONASE) 50 MCG/ACT nasal spray Place 2 sprays into both nostrils as  needed for allergies or rhinitis.    . hydrocortisone valerate ointment (WEST-CORT) 0.2 % Apply 1 application topically daily as needed (dermatitis).     [name] levothyroxine (SYNTHROID, LEVOTHROID) 75 MCG tablet Take 75 mcg by mouth daily at 6 PM.     . Misc Natural Products (TART CHERRY ADVANCED PO) Take 1 tablet by mouth daily.     . Probiotic Product (PROBIOTIC DAILY PO) Take 1 tablet by mouth daily.    . traMADol (ULTRAM) 50 MG tablet Take 1 tablet (50 mg total) by mouth every 6 (six) hours as needed. 20 tablet 0  . esomeprazole (NEXIUM) 20 MG capsule Take 1 capsule (20 mg total) by mouth daily at 12 noon. (Patient not taking: Reported on 02/01/2015) 3 capsule 2  . naproxen sodium (ANAPROX) 220 MG tablet Take 220 mg by mouth 2 (two) times daily as needed (pain).    . predniSONE (DELTASONE) 10 MG tablet Take 10 mg by mouth daily. Taper dosage 10 mg every day (60, 50, 40, 30, 20, )  0   No current facility-administered medications for this visit.    Allergies  Allergen Reactions  . Amlodipine     PEDAL EDEMA   . Contrast Media [Iodinated Diagnostic Agents] Hives    IVP  dye per patient  . Pepcid [Famotidine] Swelling    "Throat Swelling"   . Prilosec [Omeprazole] Nausea Only  . Cephalexin Rash  . Ciprofloxacin Rash  . Erythromycin Rash  . Sulfonamide Derivatives Rash  . Tetracycline Rash    Review of Systems negative except from HPI and PMH  Physical Exam BP 152/74 mmHg  Pulse 62  Ht 5\' 7"  (1.702 m)  Wt 133 lb 3.2 oz (60.419 kg)  BMI 20.86 kg/m2 Well developed and well nourished in no acute distress HENT normal E scleral and icterus clear Neck Supple JVP flat; carotids brisk and full Clear to ausculation   There is no significant swelling of the left upper extremity  There is not discolored hematoma along the left thorax *Regular rate and rhythm, no murmurs gallops or rub Soft with active bowel sounds No clubbing cyanosis   Edema Alert and oriented, grossly normal  motor and sensory function Skin Warm and Dry    Assessment and  Plan   I don't know that there is a venous thrombosis as suggested by the Doppler scan. On this is supposed to be specific albeit not all at sensitive test, the lack of peripheral stigmata may be question the accuracy of the test. She unfortunately has an allergy to contrast dye so she will require premedication with prednisone and  Benadryl. She is allergic to H2 blockers   we will decrease her aspirin 81 mg chest.   CBC comes back with WBC 19.5

## 2015-02-03 NOTE — Interval H&P Note (Signed)
History and Physical Interval Note:  02/03/2015 3:51 PM  Amanda [name]  has presented today for surgery, with the diagnosis of left arm pain, abnormal doppler  The various methods of treatment have been discussed with the patient and family. After consideration of risks, benefits and other options for treatment, the patient has consented to  Procedure(s): Upper Extremity Venography (N/A) as a surgical intervention .  The patient's history has been reviewed, patient examined, no change in status, stable for surgery.  I have reviewed the patient's chart and labs.  Questions were answered to the patient's satisfaction.     Sherryl Manges  no changes

## 2015-02-04 ENCOUNTER — Encounter (HOSPITAL_COMMUNITY): Payer: Self-pay | Admitting: Internal Medicine

## 2015-02-14 ENCOUNTER — Encounter: Payer: Self-pay | Admitting: Internal Medicine

## 2015-02-23 ENCOUNTER — Other Ambulatory Visit (HOSPITAL_COMMUNITY): Payer: Self-pay | Admitting: Family Medicine

## 2015-02-23 DIAGNOSIS — R519 Headache, unspecified: Secondary | ICD-10-CM

## 2015-02-23 DIAGNOSIS — R4781 Slurred speech: Secondary | ICD-10-CM

## 2015-02-23 DIAGNOSIS — R51 Headache: Principal | ICD-10-CM

## 2015-02-25 ENCOUNTER — Ambulatory Visit (HOSPITAL_COMMUNITY)
Admission: RE | Admit: 2015-02-25 | Discharge: 2015-02-25 | Disposition: A | Discharge status: Home / Self Care | Payer: Medicare Other | Source: Ambulatory Visit | Attending: Family Medicine | Admitting: Family Medicine

## 2015-02-25 DIAGNOSIS — R4781 Slurred speech: Secondary | ICD-10-CM

## 2015-02-25 DIAGNOSIS — R479 Unspecified speech disturbances: Secondary | ICD-10-CM | POA: Insufficient documentation

## 2015-02-25 NOTE — Progress Notes (Signed)
*  PRELIMINARY RESULTS* Vascular Ultrasound Carotid Duplex (Doppler) has been completed.   Findings suggest 1-39% stenosis bilaterally. Vertebral arteries are patent with antegrade flow.  02/25/2015 11:36 AM Gertie Fey, RVT, RDCS, RDMS

## 2015-03-02 ENCOUNTER — Ambulatory Visit (HOSPITAL_COMMUNITY)
Admission: RE | Admit: 2015-03-02 | Discharge: 2015-03-02 | Disposition: A | Discharge status: Home / Self Care | Payer: Medicare Other | Source: Ambulatory Visit | Attending: Family Medicine | Admitting: Family Medicine

## 2015-03-02 ENCOUNTER — Encounter (HOSPITAL_COMMUNITY): Payer: Self-pay

## 2015-03-02 DIAGNOSIS — R519 Headache, unspecified: Secondary | ICD-10-CM

## 2015-03-02 DIAGNOSIS — G319 Degenerative disease of nervous system, unspecified: Secondary | ICD-10-CM | POA: Diagnosis not present

## 2015-03-02 DIAGNOSIS — R4781 Slurred speech: Secondary | ICD-10-CM | POA: Diagnosis not present

## 2015-03-02 DIAGNOSIS — R51 Headache: Secondary | ICD-10-CM | POA: Insufficient documentation

## 2015-03-03 ENCOUNTER — Encounter (HOSPITAL_COMMUNITY): Payer: Medicare Other

## 2015-03-23 ENCOUNTER — Encounter: Payer: Self-pay | Admitting: Cardiology

## 2015-04-01 ENCOUNTER — Emergency Department (HOSPITAL_COMMUNITY): Payer: Medicare Other

## 2015-04-01 ENCOUNTER — Inpatient Hospital Stay (HOSPITAL_COMMUNITY)
Admission: EM | Admit: 2015-04-01 | Discharge: 2015-04-04 | DRG: 871 | Disposition: A | Discharge status: Home / Self Care | Payer: Medicare Other | Attending: Internal Medicine | Admitting: Internal Medicine

## 2015-04-01 ENCOUNTER — Encounter (HOSPITAL_COMMUNITY): Payer: Self-pay | Admitting: *Deleted

## 2015-04-01 DIAGNOSIS — G9340 Encephalopathy, unspecified: Secondary | ICD-10-CM | POA: Diagnosis present

## 2015-04-01 DIAGNOSIS — D72829 Elevated white blood cell count, unspecified: Secondary | ICD-10-CM | POA: Diagnosis present

## 2015-04-01 DIAGNOSIS — D696 Thrombocytopenia, unspecified: Secondary | ICD-10-CM | POA: Diagnosis present

## 2015-04-01 DIAGNOSIS — E871 Hypo-osmolality and hyponatremia: Secondary | ICD-10-CM | POA: Diagnosis present

## 2015-04-01 DIAGNOSIS — N179 Acute kidney failure, unspecified: Secondary | ICD-10-CM | POA: Diagnosis present

## 2015-04-01 DIAGNOSIS — N390 Urinary tract infection, site not specified: Secondary | ICD-10-CM | POA: Insufficient documentation

## 2015-04-01 DIAGNOSIS — N183 Chronic kidney disease, stage 3 unspecified: Secondary | ICD-10-CM | POA: Diagnosis present

## 2015-04-01 DIAGNOSIS — G9341 Metabolic encephalopathy: Secondary | ICD-10-CM | POA: Diagnosis present

## 2015-04-01 DIAGNOSIS — R6520 Severe sepsis without septic shock: Secondary | ICD-10-CM

## 2015-04-01 DIAGNOSIS — E039 Hypothyroidism, unspecified: Secondary | ICD-10-CM | POA: Diagnosis present

## 2015-04-01 DIAGNOSIS — A419 Sepsis, unspecified organism: Secondary | ICD-10-CM | POA: Diagnosis not present

## 2015-04-01 DIAGNOSIS — I10 Essential (primary) hypertension: Secondary | ICD-10-CM | POA: Diagnosis present

## 2015-04-01 LAB — CBC WITH DIFFERENTIAL/PLATELET
BASOS PCT: 0 %
Basophils Absolute: 0 10*3/uL (ref 0.0–0.1)
Eosinophils Absolute: 0 10*3/uL (ref 0.0–0.7)
Eosinophils Relative: 0 %
HEMATOCRIT: 36.5 % (ref 36.0–46.0)
HEMOGLOBIN: 12.2 g/dL (ref 12.0–15.0)
LYMPHS ABS: 1.3 10*3/uL (ref 0.7–4.0)
Lymphocytes Relative: 6 %
MCH: 30.3 pg (ref 26.0–34.0)
MCHC: 33.4 g/dL (ref 30.0–36.0)
MCV: 90.8 fL (ref 78.0–100.0)
MONOS PCT: 7 %
Monocytes Absolute: 1.6 10*3/uL — ABNORMAL HIGH (ref 0.1–1.0)
NEUTROS ABS: 19.8 10*3/uL — AB (ref 1.7–7.7)
NEUTROS PCT: 87 %
Platelets: 125 10*3/uL — ABNORMAL LOW (ref 150–400)
RBC: 4.02 MIL/uL (ref 3.87–5.11)
RDW: 13.4 % (ref 11.5–15.5)
WBC: 22.8 10*3/uL — AB (ref 4.0–10.5)

## 2015-04-01 LAB — URINALYSIS, ROUTINE W REFLEX MICROSCOPIC
BILIRUBIN URINE: NEGATIVE
Glucose, UA: NEGATIVE mg/dL
Ketones, ur: 15 mg/dL — AB
Nitrite: POSITIVE — AB
PH: 6.5 (ref 5.0–8.0)
Protein, ur: 30 mg/dL — AB
SPECIFIC GRAVITY, URINE: 1.015 (ref 1.005–1.030)
Urobilinogen, UA: 1 mg/dL (ref 0.0–1.0)

## 2015-04-01 LAB — URINE MICROSCOPIC-ADD ON

## 2015-04-01 LAB — COMPREHENSIVE METABOLIC PANEL
ALBUMIN: 2.7 g/dL — AB (ref 3.5–5.0)
ALK PHOS: 50 U/L (ref 38–126)
ALT: 12 U/L — AB (ref 14–54)
ANION GAP: 10 (ref 5–15)
AST: 17 U/L (ref 15–41)
BILIRUBIN TOTAL: 0.8 mg/dL (ref 0.3–1.2)
BUN: 27 mg/dL — AB (ref 6–20)
CALCIUM: 8.7 mg/dL — AB (ref 8.9–10.3)
CO2: 27 mmol/L (ref 22–32)
CREATININE: 1.81 mg/dL — AB (ref 0.44–1.00)
Chloride: 94 mmol/L — ABNORMAL LOW (ref 101–111)
GFR calc Af Amer: 30 mL/min — ABNORMAL LOW (ref >60)
GFR calc non Af Amer: 26 mL/min — ABNORMAL LOW (ref >60)
GLUCOSE: 164 mg/dL — AB (ref 65–99)
Potassium: 3.6 mmol/L (ref 3.5–5.1)
SODIUM: 131 mmol/L — AB (ref 135–145)
TOTAL PROTEIN: 5.1 g/dL — AB (ref 6.5–8.1)

## 2015-04-01 LAB — I-STAT CG4 LACTIC ACID, ED
LACTIC ACID, VENOUS: 0.94 mmol/L (ref 0.5–2.0)
Lactic Acid, Venous: 0.87 mmol/L (ref 0.5–2.0)

## 2015-04-01 LAB — PROTIME-INR
INR: 1.3 (ref 0.00–1.49)
Prothrombin Time: 16.3 seconds — ABNORMAL HIGH (ref 11.6–15.2)

## 2015-04-01 MED ORDER — DEXTROSE 5 % IV SOLN
1.0000 g | INTRAVENOUS | Status: DC
  Administered 2015-04-02 – 2015-04-03 (×2): 1 g via INTRAVENOUS
  Filled 2015-04-01 (×3): qty 10

## 2015-04-01 MED ORDER — DEXTROSE 5 % IV SOLN
1.0000 g | INTRAVENOUS | Status: DC
  Administered 2015-04-01: 1 g via INTRAVENOUS
  Filled 2015-04-01: qty 10

## 2015-04-01 NOTE — H&P (Signed)
[facility] Admission History and Physical       MAXINE STELLWAGEN KCM:[id number] DOB: [birth year] DOA: 04/01/2015  Referring physician: EDP PCP: [name], MD  Specialists:   Chief Complaint:  Confusion  HPI: RAMEY STONUM is a 74 y.o. female with history of MVP, Mobitz II S/P PPM,  HTN, Stage III CKD, Hypothyroid who presents to the ED with increased confusion x 1-2 weeks per family and with weakness lethargy and poor intake of foods an liquids the past few days.   She was treated for 3 days for a UTI a few weeks ago but her family feels that she may not have gotten better.  Her family also reports that the patient has had a decline since her pacemaker placement in July.   She was evaluated in the ED and found to have a positive UA and a Urine culture was sent and empiric IV Rocephin was started.    A head CT scan was performed and negative for acute findings.      Review of Systems: Unable to Obtain from the Patient  Past Medical History  Diagnosis Date  . Allergic rhinitis   . Hyperparathyroidism   . DJD (degenerative joint disease)   . MVP (mitral valve prolapse)     moderate posterior MVP with moderate MR and grade II diasotlic dysfunction  . Hypothyroidism   . Hyperlipidemia   . Anemia   . Small vessel disease, cerebrovascular   . Chronic kidney disease     stage III  . IBS (irritable bowel syndrome)   . Glomerulonephritis     Dr [name]  . PAC (premature atrial contraction)   . Interstitial cystitis   . Hypertension   . Mobitz II     a. s/p STJ dual chamber PPM      Past Surgical History  Procedure Laterality Date  . Abdominal hysterectomy    . Breast biopsy Right   . Tonsillectomy    . Ep implantable device N/A 01/07/2015    STJ dual chamber pacemaker implanted by Dr [name] for 2:1 heart block  . Peripheral vascular catheterization N/A 02/03/2015    Procedure: Upper Extremity Venography;  Surgeon: [name], MD;  Location: [facility] INVASIVE  CV LAB;  Service: Cardiovascular;  Laterality: N/A;      Prior to Admission medications   Medication Sig Start Date End Date Taking? Authorizing Provider  aspirin 325 MG EC tablet Take 325 mg by mouth daily.   Yes Historical Provider, MD  atenolol (TENORMIN) 50 MG tablet Take 1 tablet (50 mg total) by mouth 2 (two) times daily. Patient taking differently: Take 50 mg by mouth every evening.  01/26/15  Yes Quintella Reichert, MD  calcitRIOL (ROCALTROL) 0.25 MCG capsule Take 0.25 mcg by mouth every other day.    Yes Historical Provider, MD  Cyanocobalamin (VITAMIN B-12) 1000 MCG SUBL Place 1,000 mcg under the tongue daily.    Yes Historical Provider, MD  fexofenadine (ALLEGRA) 180 MG tablet Take 180 mg by mouth daily.   Yes Historical Provider, MD  levothyroxine (SYNTHROID, LEVOTHROID) 75 MCG tablet Take 75 mcg by mouth daily at 6 PM.    Yes Historical Provider, MD  Misc Natural Products (TART CHERRY ADVANCED PO) Take 1,200 mg by mouth daily.    Yes Historical Provider, MD  naproxen sodium (ANAPROX) 220 MG tablet Take 220 mg by mouth 2 (two) times daily as needed (pain).   Yes Historical Provider, MD  Probiotic Product (PROBIOTIC DAILY PO) Take  1 tablet by mouth daily.   Yes Historical Provider, MD  aspirin EC 81 MG tablet Take 1 tablet (81 mg total) by mouth daily. 02/01/15   [name], MD  diphenhydrAMINE (BENADRYL) 25 MG tablet Take 1 tablet (25 mg total) by mouth on 7/27 at 6 pm and another tablet on 7/28 at 6 am. 02/01/15   [name], MD  predniSONE (DELTASONE) 10 MG tablet Take 6 tablets (60 mg total) on 7/27 at 6 pm Patient taking differently: Take 10-60 mg by mouth as directed. Take 6 tablets (60 mg total) on 7/27 at 6 pm. Taper dosage 10 mg every day (60, 50, 40, 30, 20, ) 02/01/15   [name], MD  traMADol (ULTRAM) 50 MG tablet Take 1 tablet (50 mg total) by mouth every 6 (six) hours as needed. Patient taking differently: Take 50 mg by mouth every 6 (six) hours as needed for  moderate pain or severe pain.  01/21/15   Cathren Laine, MD     Allergies  Allergen Reactions  . Allopurinol Other (See Comments)    Caused headaches  . Amlodipine Other (See Comments)    PEDAL EDEMA   . Colchicine Other (See Comments)  . Contrast Media [Iodinated Diagnostic Agents] Hives    IVP dye per patient  . Pepcid [Famotidine] Swelling    "Throat Swelling"   . Prilosec [Omeprazole] Nausea Only  . Amoxicillin Itching and Rash  . Augmentin [Amoxicillin-Pot Clavulanate] Itching and Rash  . Cephalexin Itching and Rash  . Ciprofloxacin Itching and Rash  . Erythromycin Itching and Rash  . Sulfonamide Derivatives Itching and Rash  . Tetracycline Itching and Rash    Social History:  reports that she has quit smoking. She has never used smokeless tobacco. She reports that she does not drink alcohol or use illicit drugs.    Family History  Problem Relation Age of Onset  . Ovarian cancer Sister   . Colon cancer Neg Hx   . Heart disease Mother   . Heart disease Father   . Heart disease Brother   . Rheum arthritis Sister   . Melanoma Brother   . Brain cancer Brother   . Heart attack Brother   . Heart attack Father   . Stroke Mother   . Hypertension Mother   . Hypertension Brother   . Hypertension Sister      Physical Exam:  GEN: Pleasant and Confused Well Nourished and Well Developed Elderly 75 y.o. Caucasian female examined and in no acute distress; cooperative with exam Filed Vitals:   04/01/15 1843 04/01/15 2011  BP: 108/48 125/74  Pulse: 72 74  Temp: 99.2 F (37.3 C)   TempSrc: Oral   Resp: 16 18  Height:  (1.702 m)   Weight: 58.968 kg (130 lb)   SpO2: 95% 99%   Blood pressure 125/74, pulse 74, temperature 99.2 F (37.3 C), temperature source Oral, resp. rate 18, height  (1.702 m), weight 58.968 kg (130 lb), SpO2 99 %. PSYCH: She is alert and oriented x 1; does not appear anxious does not appear depressed; affect is normal HEENT: Normocephalic  and Atraumatic, Mucous membranes pink; PERRLA; EOM intact; Fundi:  Benign;  No scleral icterus, Nares: Patent, Oropharynx: Clear, Fair Dentition,    Neck:  FROM, No Cervical Lymphadenopathy nor Thyromegaly or Carotid Bruit; No JVD; Breasts:: Not examined CHEST WALL: No tenderness CHEST: Normal respiration, clear to auscultation bilaterally HEART: Regular rate and rhythm; no murmurs rubs or gallops  BACK: No kyphosis or scoliosis; No CVA tenderness ABDOMEN: Positive Bowel Sounds, Soft Non-Tender, No Rebound or Guarding; No Masses, No Organomegaly. Rectal Exam: Not done EXTREMITIES: No  Cyanosis, Clubbing, or Edema; No Ulcerations. Genitalia: not examined PULSES: 2+ and symmetric SKIN: Normal hydration no rash or ulceration CNS:  Alert and Oriented x 1, No Focal Deficits Vascular: pulses palpable throughout    Labs on Admission:  Basic Metabolic Panel:  Recent Labs Lab 04/01/15 2026  NA 131*  K 3.6  CL 94*  CO2 27  GLUCOSE 164*  BUN 27*  CREATININE 1.81*  CALCIUM 8.7*   Liver Function Tests:  Recent Labs Lab 04/01/15 2026  AST 17  ALT 12*  ALKPHOS 50  BILITOT 0.8  PROT 5.1*  ALBUMIN 2.7*   No results for input(s): LIPASE, AMYLASE in the last 168 hours. No results for input(s): AMMONIA in the last 168 hours. CBC:  Recent Labs Lab 04/01/15 2026  WBC 22.8*  NEUTROABS 19.8*  HGB 12.2  HCT 36.5  MCV 90.8  PLT 125*   Cardiac Enzymes: No results for input(s): CKTOTAL, CKMB, CKMBINDEX, TROPONINI in the last 168 hours.  BNP (last 3 results)  Recent Labs  01/06/15 2021  BNP 639.0*    ProBNP (last 3 results) No results for input(s): PROBNP in the last 8760 hours.  CBG: No results for input(s): GLUCAP in the last 168 hours.  Radiological Exams on Admission: Dg Chest 2 View  04/01/2015   CLINICAL DATA:  Altered mental status.  EXAM: CHEST  2 VIEW  COMPARISON:  01/21/2015.  FINDINGS: Normal sized heart. Clear lungs with normal vascularity. Stable left  subclavian pacemaker leads. Diffuse osteopenia. Mild thoracic spine degenerative changes.  IMPRESSION: No acute abnormality.   Electronically Signed   By: Beckie Salts M.D.   On: 04/01/2015 21:29   Ct Head Wo Contrast  04/01/2015   CLINICAL DATA:  Patient with altered mental status.  Acute delirium.  EXAM: CT HEAD WITHOUT CONTRAST  TECHNIQUE: Contiguous axial images were obtained from the base of the skull through the vertex without intravenous contrast.  COMPARISON:  Brain CT 03/02/2015  FINDINGS: Periventricular and subcortical white matter hypodensity compatible with chronic small vessel ischemic changes. 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 Ventricles and sulci are prominent compatible with atrophy. No definite evidence for acute cortically based infarct, intracranial hemorrhage, mass lesion or mass-effect. Paranasal sinuses are well aerated. Mastoid air cells are unremarkable. Calvarium is intact.  IMPRESSION: No acute intracranial process.  Atrophy and chronic small vessel ischemic changes.   Electronically Signed   By: Annia Belt M.D.   On: 04/01/2015 21:13     EKG: Independently reviewed. Paced Rhythm rate =71    Assessment/Plan:     74 y.o. female with  Principal Problem:   1.     Acute encephalopathy- Most Likely due to UTI   Urine C+S sent   IV Rocephin      Active Problems:   2.      Acute UTI   Urine C+S sent    IV Rocephin     3.      AKI (acute kidney injury)   IVFs   Monitor BUN/Cr     4.      Leukocytosis- due to UTI   Monitor Trend     5.     Thrombocytopenia   Monitor PLTs     6.     Hyponatremia   IVFs with NSS     7.  Essential hypertension   Continue Atenolol as BP tolerates       8.     Hypothyroidism   Continue Levothyroxine     9.     CKD (chronic kidney disease), stage III- baseline Cr = 1.3   Monitor BUN/Cr   IVFs   10.     DVT Prophylaxis   Lovenox     Code Status:     FULL CODE      Family Communication:   Husband and Daughter  at Bedside   Disposition Plan:    Inpatient Status    Time spent:  40 Minutes   Ron Parker [facility] Pager [phone]   If 7AM -7PM Please Contact the Day Rounding Team MD for [facility]  If 7PM-7AM, Please Contact Night-Floor Coverage  [email or web address] Password [facility] 04/01/2015, 10:31 PM     ADDENDUM:   Patient was seen and examined on 04/01/2015

## 2015-04-01 NOTE — ED Notes (Signed)
Received a call from Dr. Corliss Blacker in reference to a patient that was in Acute Delirium.  The patient's daughter upon arrival advised me that her mother had a pacemaker placed in the summer and she has never fully recovered.  She thinks she is also dehydrated and has a bladder infection.  The patient just arrived to be evaluated.  Dr. Corliss Blacker did say she spoke with Dr. Donnald Garre about the patient.

## 2015-04-01 NOTE — Progress Notes (Signed)
Called ED RN for report. Room ready for admit.  

## 2015-04-01 NOTE — ED Notes (Addendum)
Pt sent here by MD for eval of confusion and UTI. Pt reports some burning with urination. Pt alert and oriented x4. Family states that she did not remember leaving the drs office today.

## 2015-04-01 NOTE — ED Provider Notes (Signed)
CSN: [id number]     Arrival date & time 04/01/15  1810 History   First MD Initiated Contact with Patient 04/01/15 1950     Chief Complaint  Patient presents with  . Urinary Tract Infection     (Consider location/radiation/quality/duration/timing/severity/associated sxs/prior Treatment) Patient is a 74 y.o. female presenting with altered mental status. The history is provided by the patient, a relative and the spouse (daughter). No language interpreter was used.  Altered Mental Status Presenting symptoms: behavior changes and confusion   Severity:  Moderate Most recent episode:  More than 2 days ago Episode history:  Single Duration:  1 week Timing:  Constant Progression:  Waxing and waning Chronicity:  New Context: recent infection (UTI dx today at PCP)   Context: not dementia, not a nursing home resident and not a recent change in medication   Associated symptoms: no abdominal pain, no agitation, no bladder incontinence, no depression, no fever, no headaches, no nausea, no rash, no seizures, no vomiting and no weakness   Associated symptoms comment:  Fatigue    Past Medical History  Diagnosis Date  . Allergic rhinitis   . Hyperparathyroidism   . DJD (degenerative joint disease)   . MVP (mitral valve prolapse)     moderate posterior MVP with moderate MR and grade II diasotlic dysfunction  . Hypothyroidism   . Hyperlipidemia   . Anemia   . Small vessel disease, cerebrovascular   . Chronic kidney disease     stage III  . IBS (irritable bowel syndrome)   . Glomerulonephritis     Dr [name]  . PAC (premature atrial contraction)   . Interstitial cystitis   . Hypertension   . Mobitz II     a. s/p STJ dual chamber PPM    Past Surgical History  Procedure Laterality Date  . Abdominal hysterectomy    . Breast biopsy Right   . Tonsillectomy    . Ep implantable device N/A 01/07/2015    STJ dual chamber pacemaker implanted by Dr [name] for 2:1 heart block  . Peripheral  vascular catheterization N/A 02/03/2015    Procedure: Upper Extremity Venography;  Surgeon: [name], MD;  Location: [facility] INVASIVE CV LAB;  Service: Cardiovascular;  Laterality: N/A;   Family History  Problem Relation Age of Onset  . Ovarian cancer Sister   . Colon cancer Neg Hx   . Heart disease Mother   . Heart disease Father   . Heart disease Brother   . Rheum arthritis Sister   . Melanoma Brother   . Brain cancer Brother   . Heart attack Brother   . Heart attack Father   . Stroke Mother   . Hypertension Mother   . Hypertension Brother   . Hypertension Sister    Social History  Substance Use Topics  . Smoking status: Former Games developer  . Smokeless tobacco: Never Used  . Alcohol Use: No   OB History    No data available     Review of Systems  Constitutional: Positive for chills and fatigue. Negative for fever and diaphoresis.  HENT: Negative for congestion.   Respiratory: Negative for shortness of breath and wheezing.   Cardiovascular: Negative for chest pain.  Gastrointestinal: Negative for nausea, vomiting, abdominal pain, diarrhea and constipation.  Genitourinary: Positive for dysuria. Negative for bladder incontinence.  Musculoskeletal: Negative for back pain, neck pain and neck stiffness.  Skin: Negative for pallor and rash.  Neurological: Negative for dizziness, seizures, weakness and headaches.  Psychiatric/Behavioral: Positive  for confusion. Negative for agitation.  All other systems reviewed and are negative.     Allergies  Amlodipine; Contrast media; Pepcid; Prilosec; Cephalexin; Ciprofloxacin; Erythromycin; Sulfonamide derivatives; and Tetracycline  Home Medications   Prior to Admission medications   Medication Sig Start Date End Date Taking? Authorizing Provider  aspirin EC 81 MG tablet Take 1 tablet (81 mg total) by mouth daily. 02/01/15   [name], MD  atenolol (TENORMIN) 50 MG tablet Take 1 tablet (50 mg total) by mouth 2 (two) times daily.  01/26/15   Quintella Reichert, MD  calcitRIOL (ROCALTROL) 0.25 MCG capsule Take 0.25 mcg by mouth every 3 (three) days.     Historical Provider, MD  Cyanocobalamin (VITAMIN B-12) 1000 MCG SUBL Place 1,000 mcg under the tongue daily.     Historical Provider, MD  diphenhydrAMINE (BENADRYL) 25 MG tablet Take 1 tablet (25 mg total) by mouth on 7/27 at 6 pm and another tablet on 7/28 at 6 am. 02/01/15   [name], MD  esomeprazole (NEXIUM) 20 MG capsule Take 1 capsule (20 mg total) by mouth daily at 12 noon. Patient not taking: Reported on 02/01/2015 01/19/14   Hart Carwin, MD  fexofenadine (ALLEGRA) 180 MG tablet Take 180 mg by mouth daily.    Historical Provider, MD  fluticasone (FLONASE) 50 MCG/ACT nasal spray Place 2 sprays into both nostrils as needed for allergies or rhinitis.    Historical Provider, MD  hydrocortisone valerate ointment (WEST-CORT) 0.2 % Apply 1 application topically daily as needed (dermatitis).     Historical Provider, MD  levothyroxine (SYNTHROID, LEVOTHROID) 75 MCG tablet Take 75 mcg by mouth daily at 6 PM.     Historical Provider, MD  Misc Natural Products (TART CHERRY ADVANCED PO) Take 1 tablet by mouth daily.     Historical Provider, MD  naproxen sodium (ANAPROX) 220 MG tablet Take 220 mg by mouth 2 (two) times daily as needed (pain).    Historical Provider, MD  predniSONE (DELTASONE) 10 MG tablet Take 6 tablets (60 mg total) on 7/27 at 6 pm Patient taking differently: Take 10-60 mg by mouth as directed. Take 6 tablets (60 mg total) on 7/27 at 6 pm. Taper dosage 10 mg every day (60, 50, 40, 30, 20, 10mg ) 02/01/15   [name], MD  Probiotic Product (PROBIOTIC DAILY PO) Take 1 tablet by mouth daily.    Historical Provider, MD  traMADol (ULTRAM) 50 MG tablet Take 1 tablet (50 mg total) by mouth every 6 (six) hours as needed. Patient taking differently: Take 50 mg by mouth every 6 (six) hours as needed for moderate pain or severe pain.  01/21/15   Cathren Laine, MD   BP 108/48  mmHg  Pulse 72  Temp(Src) 99.2 F (37.3 C) (Oral)  Resp 16  Ht 5\' 7"  (1.702 m)  Wt 130 lb (58.968 kg)  BMI 20.36 kg/m2  SpO2 95% Physical Exam  Constitutional: She is oriented to person, place, and time. Vital signs are normal. She appears well-developed and well-nourished.  Non-toxic appearance. She does not appear ill. No distress.  HENT:  Head: Normocephalic and atraumatic.  Mouth/Throat: No oropharyngeal exudate.  Eyes: Conjunctivae are normal. Pupils are equal, round, and reactive to light.  Neck: Normal range of motion.  Cardiovascular: Normal rate, regular rhythm and normal heart sounds.   No murmur heard. Pulmonary/Chest: Effort normal and breath sounds normal. No stridor. No respiratory distress. She exhibits no tenderness.  Abdominal: Soft. Bowel sounds are  normal. There is no tenderness.  Musculoskeletal: She exhibits no tenderness.  Neurological: She is alert and oriented to person, place, and time. No cranial nerve deficit or sensory deficit. She exhibits normal muscle tone. Coordination and gait normal. GCS eye subscore is 4. GCS verbal subscore is 5. GCS motor subscore is 6.  Skin: Skin is warm. She is not diaphoretic. No pallor.  Psychiatric: She has a normal mood and affect.  Nursing note and vitals reviewed.   ED Course  Procedures (including critical care time) Labs Review Labs Reviewed  URINALYSIS, ROUTINE W REFLEX MICROSCOPIC (NOT AT [facility]) - Abnormal; Notable for the following:    APPearance CLOUDY (*)    Hgb urine dipstick MODERATE (*)    Ketones, ur 15 (*)    Protein, ur 30 (*)    Nitrite POSITIVE (*)    Leukocytes, UA LARGE (*)    All other components within normal limits  URINE MICROSCOPIC-ADD ON - Abnormal; Notable for the following:    Bacteria, UA MANY (*)    All other components within normal limits  CBC WITH DIFFERENTIAL/PLATELET - Abnormal; Notable for the following:    WBC 22.8 (*)    Platelets 125 (*)    Neutro Abs 19.8 (*)    Monocytes  Absolute 1.6 (*)    All other components within normal limits  COMPREHENSIVE METABOLIC PANEL - Abnormal; Notable for the following:    Sodium 131 (*)    Chloride 94 (*)    Glucose, Bld 164 (*)    BUN 27 (*)    Creatinine, Ser 1.81 (*)    Calcium 8.7 (*)    Total Protein 5.1 (*)    Albumin 2.7 (*)    ALT 12 (*)    GFR calc non Af Amer 26 (*)    GFR calc Af Amer 30 (*)    All other components within normal limits  PROTIME-INR - Abnormal; Notable for the following:    Prothrombin Time 16.3 (*)    All other components within normal limits  URINE CULTURE  NA AND K (SODIUM & POTASSIUM), RAND UR  OSMOLALITY, URINE  BASIC METABOLIC PANEL  CBC  I-STAT CG4 LACTIC ACID, ED  I-STAT CG4 LACTIC ACID, ED    Imaging Review Dg Chest 2 View  04/01/2015   CLINICAL DATA:  Altered mental status.  EXAM: CHEST  2 VIEW  COMPARISON:  01/21/2015.  FINDINGS: Normal sized heart. Clear lungs with normal vascularity. Stable left subclavian pacemaker leads. Diffuse osteopenia. Mild thoracic spine degenerative changes.  IMPRESSION: No acute abnormality.   Electronically Signed   By: Beckie Salts M.D.   On: 04/01/2015 21:29   Ct Head Wo Contrast  04/01/2015   CLINICAL DATA:  Patient with altered mental status.  Acute delirium.  EXAM: CT HEAD WITHOUT CONTRAST  TECHNIQUE: Contiguous axial images were obtained from the base of the skull through the vertex without intravenous contrast.  COMPARISON:  Brain CT 03/02/2015  FINDINGS: Periventricular and subcortical white matter hypodensity compatible with chronic small vessel ischemic changes. 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 Ventricles and sulci are prominent compatible with atrophy. No definite evidence for acute cortically based infarct, intracranial hemorrhage, mass lesion or mass-effect. Paranasal sinuses are well aerated. Mastoid air cells are unremarkable. Calvarium is intact.  IMPRESSION: No acute intracranial process.  Atrophy and chronic small vessel ischemic  changes.   Electronically Signed   By: Annia Belt M.D.   On: 04/01/2015 21:13   I have personally reviewed and  evaluated these images and lab results as part of my medical decision-making.   EKG Interpretation   Date/Time:  Friday April 01 2015 20:13:03 EDT Ventricular Rate:  71 PR Interval:  199 QRS Duration: 157 QT Interval:  412 QTC Calculation: 448 R Axis:   -75 Text Interpretation:  Sinus rhythm and atrial paced rhythm Multiple  ventricular premature complexes Nonspecific IVCD with LAD Left ventricular  hypertrophy Inferior infarct, old Anterior infarct, old No acute changes  Confirmed by Rhunette Croft, MD, Janey Genta [phone]) on 04/01/2015 8:20:47 PM      MDM   Amanda [name] is a 74 y.o. female with a past medical history significant for hyperlipidemia, hypothyroidism, irritable bowel syndrome, and hypertension who presents from her PCPs office for altered mental status in the setting of urinary tract infection. The patient is committed by her husband and daughter who report that she has had some dysuria for the last week. The patient's become more confused over the last several days and is having difficulty with ambulation. The patient does not have a history of stroke to her knowledge however, the patient did have several episodes of intermittent difficulty speaking several days prior. The patient has not had any of this today or yesterday. The patient denies any fevers but does report chills. The patient is alert and oriented 3 however, the family reports this is intermittent. The patient denies any cough, congestion, shortness of breath. The patient denies any chest pain or any other symptoms.  The patient had diagnostic laboratory testing confirming a chronic urinary tract infection and the patient had a leukocytosis of over 22,000. The patient also was found to have worsened creatinine from prior.  As the family reports the patient is still not at her baseline, the decision was  made to admit the patient for further inpatient management of her urinary tract infection, altered mental status, and her return to normal function. The patient was started on antibiotics after discussion with pharmacy based on her allergy profile. The decision was made to give the patient Rocephin. The patient did not have any acute reaction while in the ED.  The patient had no other problems or, location as well in the emergency department and was admitted in stable condition.  This patient was seen with Dr. Rhunette Croft, emergency medicine attending.   Final diagnoses:  UTI (lower urinary tract infection)  Severe sepsis without septic shock          Theda Belfast, MD 04/02/15 6045  Derwood Kaplan, MD 04/02/15 [phone]

## 2015-04-02 ENCOUNTER — Encounter (HOSPITAL_COMMUNITY): Payer: Self-pay | Admitting: *Deleted

## 2015-04-02 ENCOUNTER — Inpatient Hospital Stay (HOSPITAL_COMMUNITY): Payer: Medicare Other

## 2015-04-02 DIAGNOSIS — E871 Hypo-osmolality and hyponatremia: Secondary | ICD-10-CM

## 2015-04-02 LAB — BASIC METABOLIC PANEL
ANION GAP: 11 (ref 5–15)
Anion gap: 7 (ref 5–15)
BUN: 28 mg/dL — AB (ref 6–20)
BUN: 28 mg/dL — AB (ref 6–20)
CHLORIDE: 95 mmol/L — AB (ref 101–111)
CHLORIDE: 99 mmol/L — AB (ref 101–111)
CO2: 24 mmol/L (ref 22–32)
CO2: 27 mmol/L (ref 22–32)
CREATININE: 1.67 mg/dL — AB (ref 0.44–1.00)
Calcium: 8.8 mg/dL — ABNORMAL LOW (ref 8.9–10.3)
Calcium: 8.3 mg/dL — ABNORMAL LOW (ref 8.9–10.3)
Creatinine, Ser: 1.8 mg/dL — ABNORMAL HIGH (ref 0.44–1.00)
GFR calc Af Amer: 31 mL/min — ABNORMAL LOW (ref >60)
GFR calc Af Amer: 33 mL/min — ABNORMAL LOW (ref >60)
GFR calc non Af Amer: 26 mL/min — ABNORMAL LOW (ref >60)
GFR calc non Af Amer: 29 mL/min — ABNORMAL LOW (ref >60)
GLUCOSE: 122 mg/dL — AB (ref 65–99)
Glucose, Bld: 106 mg/dL — ABNORMAL HIGH (ref 65–99)
POTASSIUM: 5.4 mmol/L — AB (ref 3.5–5.1)
POTASSIUM: 3.2 mmol/L — AB (ref 3.5–5.1)
SODIUM: 130 mmol/L — AB (ref 135–145)
SODIUM: 133 mmol/L — AB (ref 135–145)

## 2015-04-02 LAB — CBC
HEMATOCRIT: 38.8 % (ref 36.0–46.0)
HEMATOCRIT: 33.6 % — AB (ref 36.0–46.0)
HEMOGLOBIN: 13.8 g/dL (ref 12.0–15.0)
HEMOGLOBIN: 11.4 g/dL — AB (ref 12.0–15.0)
MCH: 31.9 pg (ref 26.0–34.0)
MCH: 30.5 pg (ref 26.0–34.0)
MCHC: 35.6 g/dL (ref 30.0–36.0)
MCHC: 33.9 g/dL (ref 30.0–36.0)
MCV: 89.8 fL (ref 78.0–100.0)
MCV: 89.8 fL (ref 78.0–100.0)
Platelets: 203 10*3/uL (ref 150–400)
Platelets: 111 10*3/uL — ABNORMAL LOW (ref 150–400)
RBC: 4.32 MIL/uL (ref 3.87–5.11)
RBC: 3.74 MIL/uL — ABNORMAL LOW (ref 3.87–5.11)
RDW: 13.5 % (ref 11.5–15.5)
RDW: 13.2 % (ref 11.5–15.5)
WBC: 25.4 10*3/uL — AB (ref 4.0–10.5)
WBC: 13.7 10*3/uL — ABNORMAL HIGH (ref 4.0–10.5)

## 2015-04-02 LAB — LACTIC ACID, PLASMA: Lactic Acid, Venous: 1 mmol/L (ref 0.5–2.0)

## 2015-04-02 LAB — OSMOLALITY, URINE: Osmolality, Ur: 382 mOsm/kg — ABNORMAL LOW (ref 390–1090)

## 2015-04-02 LAB — NA AND K (SODIUM & POTASSIUM), RAND UR
Potassium Urine: 52 mmol/L
Sodium, Ur: 46 mmol/L

## 2015-04-02 MED ORDER — ASPIRIN EC 325 MG PO TBEC
325.0000 mg | DELAYED_RELEASE_TABLET | Freq: Every day | ORAL | Status: DC
  Administered 2015-04-02 – 2015-04-04 (×3): 325 mg via ORAL
  Filled 2015-04-02 (×3): qty 1

## 2015-04-02 MED ORDER — HYDROMORPHONE HCL 1 MG/ML IJ SOLN: 0.5000 mg | INTRAMUSCULAR | Status: DC | PRN

## 2015-04-02 MED ORDER — LEVOTHYROXINE SODIUM 75 MCG PO TABS
75.0000 ug | ORAL_TABLET | Freq: Every day | ORAL | Status: DC
  Administered 2015-04-02 – 2015-04-03 (×2): 75 ug via ORAL
  Filled 2015-04-02 (×2): qty 1

## 2015-04-02 MED ORDER — OXYCODONE HCL 5 MG PO TABS
5.0000 mg | ORAL_TABLET | ORAL | Status: DC | PRN
  Filled 2015-04-02: qty 1

## 2015-04-02 MED ORDER — SODIUM CHLORIDE 0.9 % IV SOLN
INTRAVENOUS | Status: DC
  Administered 2015-04-02: 22:00:00 via INTRAVENOUS

## 2015-04-02 MED ORDER — SODIUM CHLORIDE 0.9 % IV SOLN
INTRAVENOUS | Status: AC
  Administered 2015-04-02: via INTRAVENOUS

## 2015-04-02 MED ORDER — ONDANSETRON HCL 4 MG PO TABS: 4.0000 mg | ORAL_TABLET | Freq: Four times a day (QID) | ORAL | Status: DC | PRN

## 2015-04-02 MED ORDER — ALUM & MAG HYDROXIDE-SIMETH 200-200-20 MG/5ML PO SUSP: 30.0000 mL | Freq: Four times a day (QID) | ORAL | Status: DC | PRN

## 2015-04-02 MED ORDER — ENOXAPARIN SODIUM 30 MG/0.3ML ~~LOC~~ SOLN
30.0000 mg | SUBCUTANEOUS | Status: DC
Start: 2015-04-02 — End: 2015-04-04
  Administered 2015-04-02 – 2015-04-03 (×2): 30 mg via SUBCUTANEOUS
  Filled 2015-04-02 (×2): qty 0.3

## 2015-04-02 MED ORDER — ACETAMINOPHEN 650 MG RE SUPP: 650.0000 mg | Freq: Four times a day (QID) | RECTAL | Status: DC | PRN

## 2015-04-02 MED ORDER — ONDANSETRON HCL 4 MG/2ML IJ SOLN: 4.0000 mg | Freq: Four times a day (QID) | INTRAMUSCULAR | Status: DC | PRN

## 2015-04-02 MED ORDER — INFLUENZA VAC SPLIT QUAD 0.5 ML IM SUSY
0.5000 mL | PREFILLED_SYRINGE | INTRAMUSCULAR | Status: DC
  Filled 2015-04-02 (×2): qty 0.5

## 2015-04-02 MED ORDER — RISAQUAD PO CAPS
1.0000 | ORAL_CAPSULE | Freq: Every day | ORAL | Status: DC
  Administered 2015-04-02 – 2015-04-04 (×3): 1 via ORAL
  Filled 2015-04-02 (×3): qty 1

## 2015-04-02 MED ORDER — POTASSIUM CHLORIDE CRYS ER 20 MEQ PO TBCR
40.0000 meq | EXTENDED_RELEASE_TABLET | Freq: Once | ORAL | Status: AC
  Administered 2015-04-02: 40 meq via ORAL
  Filled 2015-04-02: qty 2

## 2015-04-02 MED ORDER — ACETAMINOPHEN 325 MG PO TABS
650.0000 mg | ORAL_TABLET | Freq: Four times a day (QID) | ORAL | Status: DC | PRN
  Administered 2015-04-02 – 2015-04-03 (×2): 650 mg via ORAL
  Filled 2015-04-02 (×2): qty 2

## 2015-04-02 MED ORDER — ATENOLOL 50 MG PO TABS
50.0000 mg | ORAL_TABLET | Freq: Every evening | ORAL | Status: DC
  Administered 2015-04-02 – 2015-04-03 (×2): 50 mg via ORAL
  Filled 2015-04-02 (×2): qty 1

## 2015-04-02 NOTE — Progress Notes (Signed)
PROGRESS NOTE  Amanda [name] ZOX:[id number] DOB: 1940-10-06 DOA: 04/01/2015 PCP: [name], MD  HPI/Recap of past 24 hours:  Demented elderly female, sitting on the edge of the bed, reported feeling better,denies pain  Assessment/Plan: Principal Problem:   Acute encephalopathy Active Problems:   Essential hypertension   Acute UTI   AKI (acute kidney injury)   Leukocytosis   Thrombocytopenia   Hyponatremia   Hypothyroidism   CKD (chronic kidney disease), stage III  Metabolic encephalopathy, uti, sepsis with leukocytosis/uti/arf. Improving on ivf/rocephin, culture pending.  Hypokalemia: replace k  Hyponatremia: likely from dehydration, improving on ivf  HTN: continue atenolol  Hypothyroidism: continue synthroid, check tsh  S/p pacemaker placement, stable  Code Status: full  Family Communication: patient   Disposition Plan: home when medically stable   Consultants:  none  Procedures:  none  Antibiotics:  rocephin   Objective: BP 143/44 mmHg  Pulse 84  Temp(Src) 100.3 F (37.9 C) (Oral)  Resp 16  Ht  (1.702 m)  Wt 130 lb (58.968 kg)  BMI 20.36 kg/m2  SpO2 97%  Intake/Output Summary (Last 24 hours) at 04/02/15 1722 Last data filed at 04/02/15 1404  Gross per 24 hour  Intake 1032.5 ml  Output    300 ml  Net  732.5 ml   Filed Weights   04/01/15 1843  Weight: 130 lb (58.968 kg)    Exam:   General:  NAD  Cardiovascular: RRR  Respiratory: CTABL  Abdomen: Soft/ND/NT, positive BS  Musculoskeletal: No Edema  Neuro: not oriented to time, but to person and place, pleasant,   Data Reviewed: Basic Metabolic Panel:  Recent Labs Lab 04/01/15 2026 04/02/15 0454 04/02/15 1225  NA 131* 130* 133*  K 3.6 5.4* 3.2*  CL 94* 95* 99*  CO2 GLUCOSE 164* 106* 122*  BUN 27* 28* 28*  CREATININE 1.81* 1.80* 1.67*  CALCIUM 8.7* 8.8* 8.3*   Liver Function Tests:  Recent Labs Lab 04/01/15 2026  AST 17  ALT 12*    ALKPHOS 50  BILITOT 0.8  PROT 5.1*  ALBUMIN 2.7*   No results for input(s): LIPASE, AMYLASE in the last 168 hours. No results for input(s): AMMONIA in the last 168 hours. CBC:  Recent Labs Lab 04/01/15 2026 04/02/15 0454 04/02/15 1225  WBC 22.8* 25.4* 13.7*  NEUTROABS 19.8*  --   --   HGB 12.2 13.8 11.4*  HCT 36.5 38.8 33.6*  MCV 90.8 89.8 89.8  PLT 125* 203 111*   Cardiac Enzymes:   No results for input(s): CKTOTAL, CKMB, CKMBINDEX, TROPONINI in the last 168 hours. BNP (last 3 results)  Recent Labs  01/06/15 2021  BNP 639.0*    ProBNP (last 3 results) No results for input(s): PROBNP in the last 8760 hours.  CBG: No results for input(s): GLUCAP in the last 168 hours.  Recent Results (from the past 240 hour(s))  Urine culture     Status: None (Preliminary result)   Collection Time: 04/01/15  9:32 PM  Result Value Ref Range Status   Specimen Description URINE, CLEAN CATCH  Final   Special Requests NONE  Final   Culture >=100,000 COLONIES/mL ESCHERICHIA COLI  Final   Report Status PENDING  Incomplete     Studies: Dg Chest 2 View  04/01/2015   CLINICAL DATA:  Altered mental status.  EXAM: CHEST  2 VIEW  COMPARISON:  01/21/2015.  FINDINGS: Normal sized heart. Clear lungs with normal vascularity. Stable left subclavian pacemaker leads. Diffuse osteopenia.  Mild thoracic spine degenerative changes.  IMPRESSION: No acute abnormality.   Electronically Signed   By: Beckie Salts M.D.   On: 04/01/2015 21:29   Ct Head Wo Contrast  04/01/2015   CLINICAL DATA:  Patient with altered mental status.  Acute delirium.  EXAM: CT HEAD WITHOUT CONTRAST  TECHNIQUE: Contiguous axial images were obtained from the base of the skull through the vertex without intravenous contrast.  COMPARISON:  Brain CT 03/02/2015  FINDINGS: Periventricular and subcortical white matter hypodensity compatible with chronic small vessel ischemic changes. 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 Ventricles and sulci are  prominent compatible with atrophy. No definite evidence for acute cortically based infarct, intracranial hemorrhage, mass lesion or mass-effect. Paranasal sinuses are well aerated. Mastoid air cells are unremarkable. Calvarium is intact.  IMPRESSION: No acute intracranial process.  Atrophy and chronic small vessel ischemic changes.   Electronically Signed   By: Annia Belt M.D.   On: 04/01/2015 21:13   US Renal  04/02/2015   CLINICAL DATA:  Acute renal failure  EXAM: RENAL / URINARY TRACT ULTRASOUND COMPLETE  COMPARISON:  None.  FINDINGS: Right Kidney:  Length: 10.4 cm. Echogenic renal parenchyma, suggesting medical renal disease. No mass or hydronephrosis.  Left Kidney:  Length: 8.7 cm. Echogenic renal parenchyma, suggesting medical renal disease. 6 x 6 x 5 mm lower pole cortical cyst, simple. No hydronephrosis.  Bladder:  Within normal limits.  IMPRESSION: Small bilateral kidneys with echogenic renal parenchyma, suggesting medical renal disease.  No hydronephrosis.  6 mm left lower pole renal cyst, simple.   Electronically Signed   By: Charline Bills M.D.   On: 04/02/2015 09:45    Scheduled Meds: . acidophilus  1 capsule Oral Daily  . aspirin  325 mg Oral Daily  . atenolol  50 mg Oral QPM  . cefTRIAXone (ROCEPHIN)  IV  1 g Intravenous Q24H  . enoxaparin (LOVENOX) injection  30 mg Subcutaneous Q24H  . [START ON 04/03/2015] Influenza vac split quadrivalent PF  0.5 mL Intramuscular Tomorrow-1000  . levothyroxine  75 mcg Oral q1800  . potassium chloride  40 mEq Oral Once    Continuous Infusions: . sodium chloride 75 mL/hr at 04/02/15 1200     Time spent:  Xu,Fang MD, PhD  [facility] Pager [phone]. If 7PM-7AM, please contact night-coverage at [email or web address], password [facility] 04/02/2015, 5:22 PM  LOS: 1 day

## 2015-04-03 LAB — URINE CULTURE

## 2015-04-03 LAB — BASIC METABOLIC PANEL
ANION GAP: 8 (ref 5–15)
BUN: 26 mg/dL — ABNORMAL HIGH (ref 6–20)
CO2: 24 mmol/L (ref 22–32)
Calcium: 8.6 mg/dL — ABNORMAL LOW (ref 8.9–10.3)
Chloride: 101 mmol/L (ref 101–111)
Creatinine, Ser: 1.5 mg/dL — ABNORMAL HIGH (ref 0.44–1.00)
GFR calc Af Amer: 38 mL/min — ABNORMAL LOW (ref >60)
GFR, EST NON AFRICAN AMERICAN: 33 mL/min — AB (ref >60)
GLUCOSE: 93 mg/dL (ref 65–99)
POTASSIUM: 3.6 mmol/L (ref 3.5–5.1)
Sodium: 133 mmol/L — ABNORMAL LOW (ref 135–145)

## 2015-04-03 LAB — CBC
HEMATOCRIT: 35.4 % — AB (ref 36.0–46.0)
HEMOGLOBIN: 11.9 g/dL — AB (ref 12.0–15.0)
MCH: 30.2 pg (ref 26.0–34.0)
MCHC: 33.6 g/dL (ref 30.0–36.0)
MCV: 89.8 fL (ref 78.0–100.0)
Platelets: 104 10*3/uL — ABNORMAL LOW (ref 150–400)
RBC: 3.94 MIL/uL (ref 3.87–5.11)
RDW: 13.3 % (ref 11.5–15.5)
WBC: 11.6 10*3/uL — ABNORMAL HIGH (ref 4.0–10.5)

## 2015-04-03 LAB — TSH: TSH: 0.367 u[IU]/mL (ref 0.350–4.500)

## 2015-04-03 LAB — LACTIC ACID, PLASMA: LACTIC ACID, VENOUS: 1.1 mmol/L (ref 0.5–2.0)

## 2015-04-03 LAB — MAGNESIUM: Magnesium: 1.5 mg/dL — ABNORMAL LOW (ref 1.7–2.4)

## 2015-04-03 MED ORDER — MAGNESIUM SULFATE 2 GM/50ML IV SOLN
2.0000 g | Freq: Once | INTRAVENOUS | Status: AC
  Administered 2015-04-03: 2 g via INTRAVENOUS
  Filled 2015-04-03: qty 50

## 2015-04-03 NOTE — Progress Notes (Signed)
PROGRESS NOTE  Amanda [name] ZOX:[id number] DOB: 08-Nov-1940 DOA: 04/01/2015 PCP: [name], MD  HPI/Recap of past 24 hours:  Demented elderly female, sitting on the edge of the bed, reported feeling better,denies pain, husband and daughter in room  Assessment/Plan: Principal Problem:   Acute encephalopathy Active Problems:   Essential hypertension   Acute UTI   AKI (acute kidney injury)   Leukocytosis   Thrombocytopenia   Hyponatremia   Hypothyroidism   CKD (chronic kidney disease), stage III  Metabolic encephalopathy, uti, sepsis with leukocytosis/uti/arf. Improving on ivf/rocephin, culture pending. Patient has multiple abx allergy.   Hypokalemia: replace k  Hyponatremia: likely from dehydration, improving on ivf, continue ivf, repeat bmp in am  HTN: continue atenolol  Hypothyroidism: continue synthroid,  tsh 0.3  S/p pacemaker placement, stable  Code Status: full  Family Communication: patient and family  Disposition Plan: home when medically stable, likely tomorrow   Consultants:  none  Procedures:  none  Antibiotics:  rocephin   Objective: BP 123/56 mmHg  Pulse 59  Temp(Src) 97.8 F (36.6 C) (Oral)  Resp 16  Ht  (1.702 m)  Wt 130 lb 11.7 oz (59.3 kg)  BMI 20.47 kg/m2  SpO2 100%  Intake/Output Summary (Last 24 hours) at 04/03/15 1716 Last data filed at 04/03/15 1454  Gross per 24 hour  Intake 2017.5 ml  Output    675 ml  Net 1342.5 ml   Filed Weights   04/01/15 1843 04/03/15 0423  Weight: 130 lb (58.968 kg) 130 lb 11.7 oz (59.3 kg)    Exam:   General:  NAD  Cardiovascular: RRR  Respiratory: CTABL  Abdomen: Soft/ND/NT, positive BS  Musculoskeletal: No Edema  Neuro: not oriented to time, but to person and place, pleasant,   Data Reviewed: Basic Metabolic Panel:  Recent Labs Lab 04/01/15 2026 04/02/15 0454 04/02/15 1225 04/03/15 0536  NA 131* 130* 133* 133*  K 3.6 5.4* 3.2* 3.6  CL 94* 95* 99* 101   CO2 GLUCOSE 164* 106* 122* 93  BUN 27* 28* 28* 26*  CREATININE 1.81* 1.80* 1.67* 1.50*  CALCIUM 8.7* 8.8* 8.3* 8.6*  MG  --   --   --  1.5*   Liver Function Tests:  Recent Labs Lab 04/01/15 2026  AST 17  ALT 12*  ALKPHOS 50  BILITOT 0.8  PROT 5.1*  ALBUMIN 2.7*   No results for input(s): LIPASE, AMYLASE in the last 168 hours. No results for input(s): AMMONIA in the last 168 hours. CBC:  Recent Labs Lab 04/01/15 2026 04/02/15 0454 04/02/15 1225 04/03/15 0536  WBC 22.8* 25.4* 13.7* 11.6*  NEUTROABS 19.8*  --   --   --   HGB 12.2 13.8 11.4* 11.9*  HCT 36.5 38.8 33.6* 35.4*  MCV 90.8 89.8 89.8 89.8  PLT 125* 203 111* 104*   Cardiac Enzymes:   No results for input(s): CKTOTAL, CKMB, CKMBINDEX, TROPONINI in the last 168 hours. BNP (last 3 results)  Recent Labs  01/06/15 2021  BNP 639.0*    ProBNP (last 3 results) No results for input(s): PROBNP in the last 8760 hours.  CBG: No results for input(s): GLUCAP in the last 168 hours.  Recent Results (from the past 240 hour(s))  Urine culture     Status: None   Collection Time: 04/01/15  9:32 PM  Result Value Ref Range Status   Specimen Description URINE, CLEAN CATCH  Final   Special Requests NONE  Final   Culture >=  100,000 COLONIES/mL ESCHERICHIA COLI  Final   Report Status 04/03/2015 FINAL  Final   Organism ID, Bacteria ESCHERICHIA COLI  Final      Susceptibility   Escherichia coli - MIC*    AMPICILLIN <=2 SENSITIVE Sensitive     CEFAZOLIN <=4 SENSITIVE Sensitive     CEFTRIAXONE <=1 SENSITIVE Sensitive     CIPROFLOXACIN <=0.25 SENSITIVE Sensitive     GENTAMICIN <=1 SENSITIVE Sensitive     IMIPENEM <=0.25 SENSITIVE Sensitive     NITROFURANTOIN <=16 SENSITIVE Sensitive     TRIMETH/SULFA <=20 SENSITIVE Sensitive     AMPICILLIN/SULBACTAM <=2 SENSITIVE Sensitive     PIP/TAZO <=4 SENSITIVE Sensitive     * >=100,000 COLONIES/mL ESCHERICHIA COLI     Studies: No results found.  Scheduled  Meds: . acidophilus  1 capsule Oral Daily  . aspirin  325 mg Oral Daily  . atenolol  50 mg Oral QPM  . cefTRIAXone (ROCEPHIN)  IV  1 g Intravenous Q24H  . enoxaparin (LOVENOX) injection  30 mg Subcutaneous Q24H  . Influenza vac split quadrivalent PF  0.5 mL Intramuscular Tomorrow-1000  . levothyroxine  75 mcg Oral q1800    Continuous Infusions: . sodium chloride 75 mL/hr at 04/02/15 2132     Time spent:  Xu,Fang MD, PhD  [facility] Pager [phone]. If 7PM-7AM, please contact night-coverage at [email or web address], password [facility] 04/03/2015, 5:16 PM  LOS: 2 days

## 2015-04-04 LAB — BASIC METABOLIC PANEL
ANION GAP: 6 (ref 5–15)
BUN: 20 mg/dL (ref 6–20)
CO2: 25 mmol/L (ref 22–32)
Calcium: 8.6 mg/dL — ABNORMAL LOW (ref 8.9–10.3)
Chloride: 105 mmol/L (ref 101–111)
Creatinine, Ser: 1.29 mg/dL — ABNORMAL HIGH (ref 0.44–1.00)
GFR, EST AFRICAN AMERICAN: 46 mL/min — AB (ref >60)
GFR, EST NON AFRICAN AMERICAN: 39 mL/min — AB (ref >60)
GLUCOSE: 102 mg/dL — AB (ref 65–99)
POTASSIUM: 3.2 mmol/L — AB (ref 3.5–5.1)
Sodium: 136 mmol/L (ref 135–145)

## 2015-04-04 LAB — HEMOGLOBIN A1C
Hgb A1c MFr Bld: 6.1 % — ABNORMAL HIGH (ref 4.8–5.6)
MEAN PLASMA GLUCOSE: 128 mg/dL

## 2015-04-04 LAB — CBC
HEMATOCRIT: 33.3 % — AB (ref 36.0–46.0)
HEMOGLOBIN: 11 g/dL — AB (ref 12.0–15.0)
MCH: 29.7 pg (ref 26.0–34.0)
MCHC: 33 g/dL (ref 30.0–36.0)
MCV: 90 fL (ref 78.0–100.0)
Platelets: 102 10*3/uL — ABNORMAL LOW (ref 150–400)
RBC: 3.7 MIL/uL — AB (ref 3.87–5.11)
RDW: 13.2 % (ref 11.5–15.5)
WBC: 6.4 10*3/uL (ref 4.0–10.5)

## 2015-04-04 LAB — MAGNESIUM: Magnesium: 1.9 mg/dL (ref 1.7–2.4)

## 2015-04-04 MED ORDER — MEGESTROL ACETATE 400 MG/10ML PO SUSP
400.0000 mg | Freq: Every day | ORAL | Status: DC
  Filled 2015-04-04: qty 10

## 2015-04-04 MED ORDER — CEFIXIME 400 MG PO TABS: 400.0000 mg | ORAL_TABLET | Freq: Every day | ORAL | Status: DC

## 2015-04-04 MED ORDER — MEGESTROL ACETATE 400 MG/10ML PO SUSP: 400.0000 mg | Freq: Every day | ORAL | Status: DC

## 2015-04-04 MED ORDER — POTASSIUM CHLORIDE CRYS ER 20 MEQ PO TBCR
40.0000 meq | EXTENDED_RELEASE_TABLET | Freq: Once | ORAL | Status: AC
  Administered 2015-04-04: 40 meq via ORAL
  Filled 2015-04-04: qty 2

## 2015-04-04 NOTE — Progress Notes (Signed)
Pt provided with discharge instruction including information on follow up appointments and new medications. Pt, husband, and daughter verbalized understanding of all information. IV was dc'd with no complication  VSS. Pt escorted out via wheelchair by NT.   Carrie Mew, RN

## 2015-04-04 NOTE — Care Management Important Message (Signed)
Important Message  Patient Details  Name: Amanda [name] MRN: [id number] Date of Birth: 1940/09/24   Medicare Important Message Given:  Yes-second notification given    Orson Aloe 04/04/2015, 3:31 PM

## 2015-04-04 NOTE — Discharge Summary (Signed)
Discharge Summary  Amanda [name]:[id number] DOB: 1940/07/12  PCP: [name], MD  Admit date: 04/01/2015 Discharge date: 04/04/2015  Time spent: <7mins  Recommendations for Outpatient Follow-up:  1. F/u with PMD within a week for hospital discharge follow up  Discharge Diagnoses:  Active Hospital Problems   Diagnosis Date Noted  . Acute encephalopathy 04/01/2015  . Acute UTI 04/01/2015  . AKI (acute kidney injury) 04/01/2015  . Leukocytosis 04/01/2015  . Thrombocytopenia 04/01/2015  . Hyponatremia 04/01/2015  . Hypothyroidism 04/01/2015  . CKD (chronic kidney disease), stage III 04/01/2015  . Essential hypertension 10/05/2009    Resolved Hospital Problems   Diagnosis Date Noted Date Resolved  No resolved problems to display.    Discharge Condition: stable  Diet recommendation: heart healthy diet  Filed Weights   04/01/15 1843 04/03/15 0423 04/03/15 2134  Weight: 130 lb (58.968 kg) 130 lb 11.7 oz (59.3 kg) 133 lb 8 oz (60.555 kg)    History of present illness:  Amanda [name] is a 74 y.o. female with history of MVP, Mobitz II S/P PPM, HTN, Stage III CKD, Hypothyroid who presents to the ED with increased confusion x 1-2 weeks per family and with weakness lethargy and poor intake of foods an liquids the past few days. She was treated for 3 days for a UTI a few weeks ago but her family feels that she may not have gotten better. Her family also reports that the patient has had a decline since her pacemaker placement in July. She was evaluated in the ED and found to have a positive UA and a Urine culture was sent and empiric IV Rocephin was started. A head CT scan was performed and negative for acute findings.  Hospital Course:  Principal Problem:   Acute encephalopathy Active Problems:   Essential hypertension   Acute UTI   AKI (acute kidney injury)   Leukocytosis   Thrombocytopenia   Hyponatremia   Hypothyroidism   CKD (chronic kidney  disease), stage III  Metabolic encephalopathy, uti, sepsis with leukocytosis/uti/arf.  Resolved with ivf/rocephin,  culture urine culture +ecoli, pan sensitive. Patient has multiple abx allergy. She tolerated rocephin in the hospital, discharged with cefixime oral  po bid for additional two days to finish 5days treatment.  Hypokalemia: replaced k  Hyponatremia: likely from dehydration, resolved with ivf.  HTN: continue atenolol  Hypothyroidism: continue synthroid, tsh 0.3  S/p pacemaker placement, stable  Poor appetite: started megace.  Code Status: full  Family Communication: patient and family  Disposition Plan: home 9/26   Consultants:  none  Procedures:  none  Antibiotics:  rocephin  Discharge Exam: BP 150/51 mmHg  Pulse 61  Temp(Src) 98 F (36.7 C) (Oral)  Resp 18  Ht  (1.702 m)  Wt 133 lb 8 oz (60.555 kg)  BMI 20.90 kg/m2  SpO2 98%   General: NAD  Cardiovascular: RRR  Respiratory: CTABL  Abdomen: Soft/ND/NT, positive BS  Musculoskeletal: No Edema  Neuro: not oriented to time, but to person and place, pleasant,   Discharge Instructions You were cared for by a hospitalist during your hospital stay. If you have any questions about your discharge medications or the care you received while you were in the hospital after you are discharged, you can call the unit and asked to speak with the hospitalist on call if the hospitalist that took care of you is not available. Once you are discharged, your primary care physician will handle any further medical issues. Please note that NO REFILLS  for any discharge medications will be authorized once you are discharged, as it is imperative that you return to your primary care physician (or establish a relationship with a primary care physician if you do not have one) for your aftercare needs so that they can reassess your need for medications and monitor your lab values.  Discharge Instructions     Diet - low sodium heart healthy    Complete by:  As directed      Increase activity slowly    Complete by:  As directed             Medication List    STOP taking these medications        predniSONE 10 MG tablet  Commonly known as:  DELTASONE      TAKE these medications        aspirin 325 MG EC tablet  Take 325 mg by mouth daily.     atenolol 50 MG tablet  Commonly known as:  TENORMIN  Take 1 tablet (50 mg total) by mouth 2 (two) times daily.     calcitRIOL 0.25 MCG capsule  Commonly known as:  ROCALTROL  Take 0.25 mcg by mouth every other day.     cefixime 400 MG tablet  Commonly known as:  SUPRAX  Take 1 tablet (400 mg total) by mouth daily.     diphenhydrAMINE 25 MG tablet  Commonly known as:  BENADRYL  Take 1 tablet (25 mg total) by mouth on 7/27 at 6 pm and another tablet on 7/28 at 6 am.     fexofenadine 180 MG tablet  Commonly known as:  ALLEGRA  Take 180 mg by mouth daily.     levothyroxine 75 MCG tablet  Commonly known as:  SYNTHROID, LEVOTHROID  Take 75 mcg by mouth daily at 6 PM.     megestrol 400 MG/10ML suspension  Commonly known as:  MEGACE  Take 10 mLs (400 mg total) by mouth daily.     naproxen sodium 220 MG tablet  Commonly known as:  ANAPROX  Take 220 mg by mouth 2 (two) times daily as needed (pain).     PROBIOTIC DAILY PO  Take 1 tablet by mouth daily.     TART CHERRY ADVANCED PO  Take 1,200 mg by mouth daily.     traMADol 50 MG tablet  Commonly known as:  ULTRAM  Take 1 tablet (50 mg total) by mouth every 6 (six) hours as needed.     Vitamin B-12 1000 MCG Subl  Place 1,000 mcg under the tongue daily.       Allergies  Allergen Reactions  . Allopurinol Other (See Comments)    Caused headaches  . Amlodipine Other (See Comments)    PEDAL EDEMA   . Colchicine Other (See Comments)  . Contrast Media [Iodinated Diagnostic Agents] Hives    IVP dye per patient  . Pepcid [Famotidine] Swelling    "Throat Swelling"   . Prilosec  [Omeprazole] Nausea Only  . Amoxicillin Itching and Rash  . Augmentin [Amoxicillin-Pot Clavulanate] Itching and Rash  . Cephalexin Itching and Rash  . Ciprofloxacin Itching and Rash  . Erythromycin Itching and Rash  . Sulfonamide Derivatives Itching and Rash  . Tetracycline Itching and Rash       Follow-up Information    Follow up with MCNEILL,WENDY, MD In 1 week.   Specialty:  Family Medicine   Why:  hospital discharge follow up   Contact information:   [address]  [address] [place] [address] [phone]        The results of significant diagnostics from this hospitalization (including imaging, microbiology, ancillary and laboratory) are listed below for reference.    Significant Diagnostic Studies: Dg Chest 2 View  04/01/2015   CLINICAL DATA:  Altered mental status.  EXAM: CHEST  2 VIEW  COMPARISON:  01/21/2015.  FINDINGS: Normal sized heart. Clear lungs with normal vascularity. Stable left subclavian pacemaker leads. Diffuse osteopenia. Mild thoracic spine degenerative changes.  IMPRESSION: No acute abnormality.   Electronically Signed   By: Beckie Salts M.D.   On: 04/01/2015 21:29   Ct Head Wo Contrast  04/01/2015   CLINICAL DATA:  Patient with altered mental status.  Acute delirium.  EXAM: CT HEAD WITHOUT CONTRAST  TECHNIQUE: Contiguous axial images were obtained from the base of the skull through the vertex without intravenous contrast.  COMPARISON:  Brain CT 03/02/2015  FINDINGS: Periventricular and subcortical white matter hypodensity compatible with chronic small vessel ischemic changes. 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 Ventricles and sulci are prominent compatible with atrophy. No definite evidence for acute cortically based infarct, intracranial hemorrhage, mass lesion or mass-effect. Paranasal sinuses are well aerated. Mastoid air cells are unremarkable. Calvarium is intact.  IMPRESSION: No acute intracranial process.  Atrophy and chronic small vessel ischemic changes.    Electronically Signed   By: Annia Belt M.D.   On: 04/01/2015 21:13   US Renal  04/02/2015   CLINICAL DATA:  Acute renal failure  EXAM: RENAL / URINARY TRACT ULTRASOUND COMPLETE  COMPARISON:  None.  FINDINGS: Right Kidney:  Length: 10.4 cm. Echogenic renal parenchyma, suggesting medical renal disease. No mass or hydronephrosis.  Left Kidney:  Length: 8.7 cm. Echogenic renal parenchyma, suggesting medical renal disease. 6 x 6 x 5 mm lower pole cortical cyst, simple. No hydronephrosis.  Bladder:  Within normal limits.  IMPRESSION: Small bilateral kidneys with echogenic renal parenchyma, suggesting medical renal disease.  No hydronephrosis.  6 mm left lower pole renal cyst, simple.   Electronically Signed   By: Charline Bills M.D.   On: 04/02/2015 09:45    Microbiology: Recent Results (from the past 240 hour(s))  Urine culture     Status: None   Collection Time: 04/01/15  9:32 PM  Result Value Ref Range Status   Specimen Description URINE, CLEAN CATCH  Final   Special Requests NONE  Final   Culture >=100,000 COLONIES/mL ESCHERICHIA COLI  Final   Report Status 04/03/2015 FINAL  Final   Organism ID, Bacteria ESCHERICHIA COLI  Final      Susceptibility   Escherichia coli - MIC*    AMPICILLIN <=2 SENSITIVE Sensitive     CEFAZOLIN <=4 SENSITIVE Sensitive     CEFTRIAXONE <=1 SENSITIVE Sensitive     CIPROFLOXACIN <=0.25 SENSITIVE Sensitive     GENTAMICIN <=1 SENSITIVE Sensitive     IMIPENEM <=0.25 SENSITIVE Sensitive     NITROFURANTOIN <=16 SENSITIVE Sensitive     TRIMETH/SULFA <=20 SENSITIVE Sensitive     AMPICILLIN/SULBACTAM <=2 SENSITIVE Sensitive     PIP/TAZO <=4 SENSITIVE Sensitive     * >=100,000 COLONIES/mL ESCHERICHIA COLI     Labs: Basic Metabolic Panel:  Recent Labs Lab 04/01/15 2026 04/02/15 0454 04/02/15 1225 04/03/15 0536 04/04/15 0355  NA 131* 130* 133* 133* 136  K 3.6 5.4* 3.2* 3.6 3.2*  CL 94* 95* 99* 101 105  CO2 27 24 27 24 25   GLUCOSE 164* 106* 122* 93 102*    BUN 27* 28*  28* 26* 20  CREATININE 1.81* 1.80* 1.67* 1.50* 1.29*  CALCIUM 8.7* 8.8* 8.3* 8.6* 8.6*  MG  --   --   --  1.5* 1.9   Liver Function Tests:  Recent Labs Lab 04/01/15 2026  AST 17  ALT 12*  ALKPHOS 50  BILITOT 0.8  PROT 5.1*  ALBUMIN 2.7*   No results for input(s): LIPASE, AMYLASE in the last 168 hours. No results for input(s): AMMONIA in the last 168 hours. CBC:  Recent Labs Lab 04/01/15 2026 04/02/15 0454 04/02/15 1225 04/03/15 0536 04/04/15 0355  WBC 22.8* 25.4* 13.7* 11.6* 6.4  NEUTROABS 19.8*  --   --   --   --   HGB 12.2 13.8 11.4* 11.9* 11.0*  HCT 36.5 38.8 33.6* 35.4* 33.3*  MCV 90.8 89.8 89.8 89.8 90.0  PLT 125* 203 111* 104* 102*   Cardiac Enzymes: No results for input(s): CKTOTAL, CKMB, CKMBINDEX, TROPONINI in the last 168 hours. BNP: BNP (last 3 results)  Recent Labs  01/06/15 2021  BNP 639.0*    ProBNP (last 3 results) No results for input(s): PROBNP in the last 8760 hours.  CBG: No results for input(s): GLUCAP in the last 168 hours.     SignedAlbertine Grates MD, PhD  [facility] 04/04/2015, 10:40 AM

## 2015-04-28 ENCOUNTER — Encounter: Payer: Self-pay | Admitting: Neurology

## 2015-04-28 ENCOUNTER — Ambulatory Visit (INDEPENDENT_AMBULATORY_CARE_PROVIDER_SITE_OTHER): Payer: Medicare Other | Admitting: Neurology

## 2015-04-28 VITALS — BP 171/95 | HR 75 | Ht 67.0 in | Wt 132.0 lb

## 2015-04-28 DIAGNOSIS — R410 Disorientation, unspecified: Secondary | ICD-10-CM | POA: Diagnosis not present

## 2015-04-28 NOTE — Progress Notes (Signed)
Reason for visit: Delirium state  Referring physician: Dr. [name] is a 74 y.o. female  History of present illness:  Amanda [name] is a 74 year old right-handed white female with a history of significant small vessel ischemic changes. The patient has been seen previously for this issue. She has extensive periventricular white matter changes, and evidence of a right parietal stroke in the past. The patient recently had a pacemaker placed in the summer of 2016. She was admitted to the hospital around 04/01/2015 with a urinary tract infection associated with alteration in mental status. The patient was found to have a delirium state with significant confusion. The patient has no recollection of some of the time spent in the hospital. The patient was treated for the bladder infection, and she improved dramatically with her mental status. She has reported some problems with remembering names for people over the last one year, but otherwise she has been fully independent. She operates a Librarian, academic, she does not have any problems with directions with driving. She is able to manage her finances, keep up with medications and appointments. The patient denies any significant numbness or weakness on the face, arms, or legs. She denies any balance issues. She did have some garbled speech and some slight tingling on the left face around the time of the bladder infection. She is sent to this office for an evaluation. The family believes at this point that she is back to her usual cognitive baseline.  Past Medical History  Diagnosis Date  . Allergic rhinitis   . Hyperparathyroidism (HCC)   . DJD (degenerative joint disease)   . MVP (mitral valve prolapse)     moderate posterior MVP with moderate MR and grade II diasotlic dysfunction  . Hypothyroidism   . Hyperlipidemia   . Anemia   . Small vessel disease, cerebrovascular   . Chronic kidney disease     stage III  . IBS (irritable  bowel syndrome)   . Glomerulonephritis     Dr [name]  . PAC (premature atrial contraction)   . Interstitial cystitis   . Hypertension   . Mobitz II     a. s/p STJ dual chamber PPM     Past Surgical History  Procedure Laterality Date  . Abdominal hysterectomy    . Breast biopsy Right   . Tonsillectomy    . Ep implantable device N/A 01/07/2015    STJ dual chamber pacemaker implanted by Dr [name] for 2:1 heart block  . Peripheral vascular catheterization N/A 02/03/2015    Procedure: Upper Extremity Venography;  Surgeon: [name], MD;  Location: [facility] INVASIVE CV LAB;  Service: Cardiovascular;  Laterality: N/A;    Family History  Problem Relation Age of Onset  . Ovarian cancer Sister   . Colon cancer Neg Hx   . Heart disease Mother   . Heart disease Father   . Heart disease Brother   . Rheum arthritis Sister   . Melanoma Brother   . Brain cancer Brother   . Heart attack Brother   . Heart attack Father   . Stroke Mother   . Hypertension Mother   . Hypertension Brother   . Hypertension Sister     Social history:  reports that she has quit smoking. She has never used smokeless tobacco. She reports that she does not drink alcohol or use illicit drugs.  Medications:  Prior to Admission medications   Medication Sig Start Date End Date Taking? Authorizing Provider  amiloride-hydrochlorothiazide (MODURETIC) 5-50 MG tablet Take 1 tablet by mouth daily.   Yes Historical Provider, MD  atenolol (TENORMIN) 50 MG tablet Take 1 tablet (50 mg total) by mouth 2 (two) times daily. Patient taking differently: Take 50 mg by mouth every evening.  01/26/15  Yes Quintella Reichert, MD  calcitRIOL (ROCALTROL) 0.25 MCG capsule Take 0.25 mcg by mouth every other day.    Yes Historical Provider, MD  cefixime (SUPRAX) 400 MG tablet Take 1 tablet (400 mg total) by mouth daily. 04/04/15  Yes Albertine Grates, MD  conjugated estrogens (PREMARIN) vaginal cream Place 1 Applicatorful vaginally daily.   Yes Historical  Provider, MD  Cyanocobalamin (VITAMIN B-12) 1000 MCG SUBL Place 1,000 mcg under the tongue daily.    Yes Historical Provider, MD  dipyridamole-aspirin (AGGRENOX) 200-25 MG 12hr capsule Take 1 capsule by mouth daily.   Yes Historical Provider, MD  fexofenadine (ALLEGRA) 180 MG tablet Take 180 mg by mouth daily.   Yes Historical Provider, MD  fluticasone (FLONASE) 50 MCG/ACT nasal spray Place 2 sprays into both nostrils daily.   Yes Historical Provider, MD  hydrocortisone valerate ointment (WEST-CORT) 0.2 % Apply 1 application topically 2 (two) times daily.   Yes Historical Provider, MD  levothyroxine (SYNTHROID, LEVOTHROID) 75 MCG tablet Take 75 mcg by mouth daily at 6 PM.    Yes Historical Provider, MD  megestrol (MEGACE) 400 MG/10ML suspension Take 10 mLs (400 mg total) by mouth daily. 04/04/15  Yes Albertine Grates, MD  Misc Natural Products (TART CHERRY ADVANCED PO) Take 1,200 mg by mouth daily.    Yes Historical Provider, MD  naproxen sodium (ANAPROX) 220 MG tablet Take 220 mg by mouth 2 (two) times daily as needed (pain).   Yes Historical Provider, MD  Probiotic Product (PROBIOTIC DAILY PO) Take 1 tablet by mouth daily.   Yes Historical Provider, MD      Allergies  Allergen Reactions  . Allopurinol Other (See Comments)    Caused headaches  . Amlodipine Other (See Comments)    PEDAL EDEMA   . Colchicine Other (See Comments)  . Contrast Media [Iodinated Diagnostic Agents] Hives    IVP dye per patient  . Pepcid [Famotidine] Swelling    "Throat Swelling"   . Prilosec [Omeprazole] Nausea Only  . Atorvastatin   . Amoxicillin Itching and Rash  . Augmentin [Amoxicillin-Pot Clavulanate] Itching and Rash  . Cephalexin Itching and Rash  . Ciprofloxacin Itching and Rash  . Erythromycin Itching and Rash  . Sulfonamide Derivatives Itching and Rash  . Tetracycline Itching and Rash    ROS:  Out of a complete 14 system review of symptoms, the patient complains only of the following symptoms, and all  other reviewed systems are negative.  Weight loss Itching Blurred vision, eye pain Blood in the urine Allergies, skin sensitivity Memory loss Anxiety  Blood pressure 171/95, pulse 75, height  (1.702 m), weight 132 lb (59.875 kg).  Physical Exam  General: The patient is alert and cooperative at the time of the examination.  Eyes: Pupils are equal, round, and reactive to light. Discs are flat bilaterally.  Neck: The neck is supple, no carotid bruits are noted.  Respiratory: The respiratory examination is clear.  Cardiovascular: The cardiovascular examination reveals a regular rate and rhythm, no obvious murmurs or rubs are noted.  Skin: Extremities are without significant edema.  Neurologic Exam  Mental status: The patient is alert and oriented x 3 at the time of the examination. The patient has  apparent normal recent and remote memory, with an apparently normal attention span and concentration ability. Mini-Mental Status Examination done today shows a total score 30/30. The patient is able to name 10 animals in 30 seconds.  Cranial nerves: Facial symmetry is present. There is good sensation of the face to pinprick and soft touch bilaterally. The strength of the facial muscles and the muscles to head turning and shoulder shrug are normal bilaterally. Speech is well enunciated, no aphasia or dysarthria is noted. Extraocular movements are full. Visual fields are full. The tongue is midline, and the patient has symmetric elevation of the soft palate. No obvious hearing deficits are noted.  Motor: The motor testing reveals 5 over 5 strength of all 4 extremities. Good symmetric motor tone is noted throughout.  Sensory: Sensory testing is intact to pinprick, soft touch, vibration sensation, and position sense on all 4 extremities. No evidence of extinction is noted.  Coordination: Cerebellar testing reveals good finger-nose-finger and heel-to-shin bilaterally.  Gait and station:  Gait is normal. Tandem gait is minimally unsteady. Romberg is negative. No drift is seen.  Reflexes: Deep tendon reflexes are symmetric and normal bilaterally. Toes are downgoing bilaterally.   CT head 04/01/2015:  IMPRESSION: No acute intracranial process.  Atrophy and chronic small vessel ischemic changes.  * CT scan images were reviewed online. I agree with the written report.    Assessment/Plan:  1. Extensive small vessel disease, right parietal stroke, chronic  2. Mild memory disturbance  3. Acute delirium state, resolved  The patient has a history of extensive small vessel ischemic changes of the brain. This likely has made her susceptible to encephalopathy and delirium associated with intercurrent illness. The patient has fortunately resolved on the delirium state. The patient appears to be at or near her baseline at this time. The CT scan of the brain was done does not show any new or acute disease, the patient is unable to have MRI secondary to a recent pacemaker that was placed. At this point, no further evaluation is required, the patient will follow-up through this office if needed.  Marlan Palau MD 04/28/2015 8:56 PM  [facility] [address] [place], [place] [address]  Phone [phone] Fax [phone]

## 2015-06-17 ENCOUNTER — Encounter: Payer: Self-pay | Admitting: Cardiology

## 2015-06-17 ENCOUNTER — Ambulatory Visit (INDEPENDENT_AMBULATORY_CARE_PROVIDER_SITE_OTHER): Payer: Medicare Other | Admitting: Cardiology

## 2015-06-17 ENCOUNTER — Ambulatory Visit (INDEPENDENT_AMBULATORY_CARE_PROVIDER_SITE_OTHER): Payer: Medicare Other | Admitting: Internal Medicine

## 2015-06-17 ENCOUNTER — Encounter: Payer: Self-pay | Admitting: Internal Medicine

## 2015-06-17 VITALS — BP 128/78 | HR 73 | Ht 67.0 in | Wt 131.0 lb

## 2015-06-17 VITALS — BP 128/78 | HR 62 | Ht 67.0 in | Wt 131.4 lb

## 2015-06-17 DIAGNOSIS — I441 Atrioventricular block, second degree: Secondary | ICD-10-CM | POA: Diagnosis not present

## 2015-06-17 DIAGNOSIS — I340 Nonrheumatic mitral (valve) insufficiency: Secondary | ICD-10-CM | POA: Diagnosis not present

## 2015-06-17 DIAGNOSIS — I4430 Unspecified atrioventricular block: Secondary | ICD-10-CM

## 2015-06-17 DIAGNOSIS — I10 Essential (primary) hypertension: Secondary | ICD-10-CM | POA: Diagnosis not present

## 2015-06-17 DIAGNOSIS — I491 Atrial premature depolarization: Secondary | ICD-10-CM

## 2015-06-17 DIAGNOSIS — I341 Nonrheumatic mitral (valve) prolapse: Secondary | ICD-10-CM | POA: Diagnosis not present

## 2015-06-17 DIAGNOSIS — Z950 Presence of cardiac pacemaker: Secondary | ICD-10-CM | POA: Diagnosis not present

## 2015-06-17 NOTE — Progress Notes (Signed)
HPI Amanda [name] returns today for followup. She is a pleasant 74 yo woman with symptomatic 2:1 AV block, and mitral regurge who underwent PPM insertion 4 months ago. In the interim, she developed swelling of her left arm but ultimately had a venogram which did not demonstrate an occluded subclavian vein. She has been stable since and notes that her dyspnea is improved. No edema or chest pressure. Allergies  Allergen Reactions  . Allopurinol Other (See Comments)    Caused headaches  . Amlodipine Other (See Comments)    PEDAL EDEMA   . Colchicine Other (See Comments)  . Contrast Media [Iodinated Diagnostic Agents] Hives    IVP dye per patient  . Pepcid [Famotidine] Swelling    "Throat Swelling"   . Prilosec [Omeprazole] Nausea Only  . Atorvastatin   . Amoxicillin Itching and Rash  . Augmentin [Amoxicillin-Pot Clavulanate] Itching and Rash  . Cephalexin Itching and Rash  . Ciprofloxacin Itching and Rash  . Erythromycin Itching and Rash  . Sulfonamide Derivatives Itching and Rash  . Tetracycline Itching and Rash     Current Outpatient Prescriptions  Medication Sig Dispense Refill  . amiloride-hydrochlorothiazide (MODURETIC) 5-50 MG tablet Take 1 tablet by mouth daily.    [name] atenolol (TENORMIN) 50 MG tablet Take 50 mg by mouth daily.    . calcitRIOL (ROCALTROL) 0.25 MCG capsule Take 0.25 mcg by mouth every other day.     . conjugated estrogens (PREMARIN) vaginal cream Place 1 Applicatorful vaginally daily.    . Cyanocobalamin (VITAMIN B-12) 1000 MCG SUBL Place 1,000 mcg under the tongue daily.     [name] dipyridamole-aspirin (AGGRENOX) 200-25 MG 12hr capsule Take 1 capsule by mouth 2 (two) times daily.     . fexofenadine (ALLEGRA) 180 MG tablet Take 180 mg by mouth daily.    . fluticasone (FLONASE) 50 MCG/ACT nasal spray Place 2 sprays into both nostrils daily.    . hydrocortisone valerate ointment (WEST-CORT) 0.2 % Apply 1 application topically 2 (two) times daily.    [name]  levothyroxine (SYNTHROID, LEVOTHROID) 75 MCG tablet Take 75 mcg by mouth daily at 6 PM.     . megestrol (MEGACE) 400 MG/10ML suspension Take 400 mg by mouth daily as needed (appetite issues).    . Misc Natural Products (TART CHERRY ADVANCED PO) Take 1,200 mg by mouth daily. Tart Cherry    . naproxen sodium (ANAPROX) 220 MG tablet Take 220 mg by mouth 2 (two) times daily as needed (pain).    . Probiotic Product (PROBIOTIC DAILY PO) Take 1 tablet by mouth daily.     No current facility-administered medications for this visit.     Past Medical History  Diagnosis Date  . Allergic rhinitis   . Hyperparathyroidism (HCC)   . DJD (degenerative joint disease)   . MVP (mitral valve prolapse)     moderate posterior MVP with moderate MR and grade II diasotlic dysfunction  . Hypothyroidism   . Hyperlipidemia   . Anemia   . Small vessel disease, cerebrovascular   . Chronic kidney disease     stage III  . IBS (irritable bowel syndrome)   . Glomerulonephritis     Dr [name]  . PAC (premature atrial contraction)   . Interstitial cystitis   . Hypertension   . Mobitz II     a. s/p STJ dual chamber PPM     ROS:   All systems reviewed and negative except as noted in the HPI.   Past Surgical  History  Procedure Laterality Date  . Abdominal hysterectomy    . Breast biopsy Right   . Tonsillectomy    . Ep implantable device N/A 01/07/2015    STJ dual chamber pacemaker implanted by Dr [name] for 2:1 heart block  . Peripheral vascular catheterization N/A 02/03/2015    Procedure: Upper Extremity Venography;  Surgeon: [name], MD;  Location: [facility] INVASIVE CV LAB;  Service: Cardiovascular;  Laterality: N/A;     Family History  Problem Relation Age of Onset  . Ovarian cancer Sister   . Colon cancer Neg Hx   . Heart disease Mother   . Heart disease Father   . Heart disease Brother   . Rheum arthritis Sister   . Melanoma Brother   . Brain cancer Brother   . Heart attack Brother   .  Heart attack Father   . Stroke Mother   . Hypertension Mother   . Hypertension Brother   . Hypertension Sister      Social History   Social History  . Marital Status: Married    Spouse Name: N/A  . Number of Children: 2  . Years of Education: N/A   Occupational History  . retired    Social History Main Topics  . Smoking status: Former Games developer  . Smokeless tobacco: Never Used  . Alcohol Use: No  . Drug Use: No  . Sexual Activity: Not on file   Other Topics Concern  . Not on file   Social History Narrative   Patient drinks 1-2 cups of caffeine daily.   Patient is right handed.      BP 128/78 mmHg  Pulse 62  Ht  (1.702 m)  Wt 131 lb 6.4 oz (59.603 kg)  BMI 20.58 kg/m2  Physical Exam:  Well appearing NAD HEENT: Unremarkable Neck: 6 cm JVD, no thyromegally Lymphatics:  No adenopathy Back:  No CVA tenderness Lungs:  Clear with no wheezes HEART:  Regular rate rhythm, no murmurs, no rubs, no clicks Abd:  soft, positive bowel sounds, no organomegally, no rebound, no guarding Ext:  2 plus pulses, no edema, no cyanosis, no clubbing Skin:  No rashes no nodules Neuro:  CN II through XII intact, motor grossly intact  EKG - p synchronous ventricular pacing  DEVICE  Normal device function.  See PaceArt for details.   Assess/Plan:

## 2015-06-17 NOTE — Assessment & Plan Note (Signed)
Her St. Jude DDD device is working normally. Will recheck in several months. 

## 2015-06-17 NOTE — Patient Instructions (Signed)
Medication Instructions:  Your physician recommends that you continue on your current medications as directed. Please refer to the Current Medication list given to you today.   Labwork: None  Testing/Procedures: Your physician has requested that you have an echocardiogram in June, 2017. Echocardiography is a painless test that uses sound waves to create images of your heart. It provides your doctor with information about the size and shape of your heart and how well your heart's chambers and valves are working. This procedure takes approximately one hour. There are no restrictions for this procedure.  Follow-Up: Your physician wants you to follow-up in: 6 months with Dr. Turner. You will receive a reminder letter in the mail two months in advance. If you don't receive a letter, please call our office to schedule the follow-up appointment.   Any Other Special Instructions Will Be Listed Below (If Applicable).     If you need a refill on your cardiac medications before your next appointment, please call your pharmacy.   

## 2015-06-17 NOTE — Patient Instructions (Signed)
Medication Instructions:  Your physician recommends that you continue on your current medications as directed. Please refer to the Current Medication list given to you today.  Labwork: None ordered  Testing/Procedures: None ordered  Follow-Up: Remote monitoring is used to monitor your Pacemaker of ICD from home. This monitoring reduces the number of office visits required to check your device to one time per year. It allows [place] to keep an eye on the functioning of your device to ensure it is working properly. You are scheduled for a device check from home on 09/19/15. You may send your transmission at any time that day. If you have a wireless device, the transmission will be sent automatically. After your physician reviews your transmission, you will receive a postcard with your next transmission date.  Your physician wants you to follow-up in: 9 months with Dr. [name].  You will receive a reminder letter in the mail two months in advance. If you don't receive a letter, please call our office to schedule the follow-up appointment.  If you need a refill on your cardiac medications before your next appointment, please call your pharmacy.  Thank you for choosing [facility]!!

## 2015-06-17 NOTE — Assessment & Plan Note (Signed)
Her blood pressure is well controlled. Will follow.  

## 2015-06-17 NOTE — Assessment & Plan Note (Signed)
Her exam is unremarkable today. We discussed the natural history of mitral regurge/prolapse. She will followup with [name].

## 2015-06-17 NOTE — Progress Notes (Signed)
Cardiology Office Note   Date:  06/17/2015   ID:  Amanda [name], DOB 1941/04/22, MRN [id number]  PCP:  [name], MD    Chief Complaint  Patient presents with  . Hypertension    PT HAS NO COMPLAINTS  . Mitral Valve Prolapse      History of Present Illness: Amanda [name] is a 74 y.o. female with a history of PAC's, MVP with moderate MR,  Second degree AV block s/p PPM and HTN who presents today for followup. She is doing well. She denies any chest pain, SOB, DOE, LE edema, dizziness or syncope.     Past Medical History  Diagnosis Date  . Allergic rhinitis   . Hyperparathyroidism (HCC)   . DJD (degenerative joint disease)   . MVP (mitral valve prolapse)     moderate posterior MVP with moderate MR and grade II diasotlic dysfunction  . Hypothyroidism   . Hyperlipidemia   . Anemia   . Small vessel disease, cerebrovascular   . Chronic kidney disease     stage III  . IBS (irritable bowel syndrome)   . Glomerulonephritis     Dr [name]  . PAC (premature atrial contraction)   . Interstitial cystitis   . Hypertension   . Mobitz II     a. s/p STJ dual chamber PPM     Past Surgical History  Procedure Laterality Date  . Abdominal hysterectomy    . Breast biopsy Right   . Tonsillectomy    . Ep implantable device N/A 01/07/2015    STJ dual chamber pacemaker implanted by Dr [name] for 2:1 heart block  . Peripheral vascular catheterization N/A 02/03/2015    Procedure: Upper Extremity Venography;  Surgeon: [name], MD;  Location: [facility] INVASIVE CV LAB;  Service: Cardiovascular;  Laterality: N/A;     Current Outpatient Prescriptions  Medication Sig Dispense Refill  . amiloride-hydrochlorothiazide (MODURETIC) 5-50 MG tablet Take 1 tablet by mouth daily.    [name] atenolol (TENORMIN) 50 MG tablet Take 50 mg by mouth daily.    . calcitRIOL (ROCALTROL) 0.25 MCG capsule Take 0.25 mcg by mouth every other day.     . conjugated estrogens  (PREMARIN) vaginal cream Place 1 Applicatorful vaginally daily.    . Cyanocobalamin (VITAMIN B-12) 1000 MCG SUBL Place 1,000 mcg under the tongue daily.     [name] dipyridamole-aspirin (AGGRENOX) 200-25 MG 12hr capsule Take 1 capsule by mouth 2 (two) times daily.     . fexofenadine (ALLEGRA) 180 MG tablet Take 180 mg by mouth daily.    . fluticasone (FLONASE) 50 MCG/ACT nasal spray Place 2 sprays into both nostrils daily.    . hydrocortisone valerate ointment (WEST-CORT) 0.2 % Apply 1 application topically 2 (two) times daily.    [name] levothyroxine (SYNTHROID, LEVOTHROID) 75 MCG tablet Take 75 mcg by mouth daily at 6 PM.     . megestrol (MEGACE) 400 MG/10ML suspension Take 400 mg by mouth daily as needed (appetite issues).    . Misc Natural Products (TART CHERRY ADVANCED PO) Take 1,200 mg by mouth daily. Tart Cherry    . naproxen sodium (ANAPROX) 220 MG tablet Take 220 mg by mouth 2 (two) times daily as needed (pain).    . Probiotic Product (PROBIOTIC DAILY PO) Take 1 tablet by mouth daily.     No current facility-administered medications for this visit.    Allergies:   Allopurinol;  Amlodipine; Colchicine; Contrast media; Pepcid; Prilosec; Atorvastatin; Amoxicillin; Augmentin; Cephalexin; Ciprofloxacin; Erythromycin; Sulfonamide derivatives; and Tetracycline    Social History:  The patient  reports that she has quit smoking. She has never used smokeless tobacco. She reports that she does not drink alcohol or use illicit drugs.   Family History:  The patient's family history includes Brain cancer in her brother; Heart attack in her brother and father; Heart disease in her brother, father, and mother; Hypertension in her brother, mother, and sister; Melanoma in her brother; Ovarian cancer in her sister; Rheum arthritis in her sister; Stroke in her mother. There is no history of Colon cancer.    ROS:  Please see the history of present illness.   Otherwise, review of systems are positive for none.   All  other systems are reviewed and negative.    PHYSICAL EXAM: VS:  BP 128/78 mmHg  Pulse 73  Ht  (1.702 m)  Wt 131 lb (59.421 kg)  BMI 20.51 kg/m2  SpO2 99% , BMI Body mass index is 20.51 kg/(m^2). GEN: Well nourished, well developed, in no acute distress HEENT: normal Neck: no JVD, carotid bruits, or masses Cardiac: 2RRR; no murmurs, rubs, or gallops,no edema  Respiratory:  clear to auscultation bilaterally, normal work of breathing GI: soft, nontender, nondistended, + BS MS: no deformity or atrophy Skin: warm and dry, no rash Neuro:  Strength and sensation are intact Psych: euthymic mood, full affect   EKG:  EKG is not ordered today.    Recent Labs: 01/06/2015: B Natriuretic Peptide 639.0* 04/01/2015: ALT 12* 04/03/2015: TSH 0.367 04/04/2015: BUN 20; Creatinine, Ser 1.29*; Hemoglobin 11.0*; Magnesium 1.9; Platelets 102*; Potassium 3.2*; Sodium 136    Lipid Panel    Component Value Date/Time   CHOL 153 01/07/2015 0304   TRIG 67 01/07/2015 0304   HDL 51 01/07/2015 0304   CHOLHDL 3.0 01/07/2015 0304   VLDL 13 01/07/2015 0304   LDLCALC 89 01/07/2015 0304      Wt Readings from Last 3 Encounters:  06/17/15 131 lb (59.421 kg)  06/17/15 131 lb 6.4 oz (59.603 kg)  04/28/15 132 lb (59.875 kg)    ASSESSMENT AND PLAN:  1. Moderate posterior MVP with mild to moderate MR - repeat echo 12/2015 2. HTN - BP controlled - continue diuretic/BB 3. PAC's - asymptomatic 4.RBBB - no ischemia on myoview 5. Second degree AV block s/p PPM - per Dr. [name]   Current medicines are reviewed at length with the patient today.  The patient does not have concerns regarding medicines.  The following changes have been made:  no change  Labs/ tests ordered today: See above Assessment and Plan No orders of the defined types were placed in this encounter.     Disposition:   FU with me in 6 months  Signed, Quintella Reichert, MD  06/17/2015 9:40 AM    [facility]  [facility] [address] [place], [place], [place]  [address] Phone: [phone]; Fax: [phone]

## 2015-06-27 LAB — CUP PACEART INCLINIC DEVICE CHECK
Battery Remaining Percentage: 95 %
Brady Statistic RA Percent Paced: 21 %
Date Time Interrogation Session: 20161219155758
Implantable Lead Implant Date: 20160701
Implantable Lead Implant Date: 20160701
Implantable Lead Location: 753859
Lead Channel Impedance Value: 560 Ohm
Lead Channel Impedance Value: 610 Ohm
Lead Channel Pacing Threshold Pulse Width: 0.4 ms
Lead Channel Sensing Intrinsic Amplitude: 4.1 mV
Lead Channel Setting Pacing Amplitude: 1.375
Lead Channel Setting Pacing Pulse Width: 0.4 ms
MDC IDC LEAD LOCATION: 753860
MDC IDC MSMT BATTERY VOLTAGE: 3.02 V
MDC IDC MSMT LEADCHNL RA PACING THRESHOLD AMPLITUDE: 0.75 V
MDC IDC MSMT LEADCHNL RA PACING THRESHOLD PULSEWIDTH: 0.4 ms
MDC IDC MSMT LEADCHNL RV PACING THRESHOLD AMPLITUDE: 1 V
MDC IDC MSMT LEADCHNL RV SENSING INTR AMPL: 12 mV
MDC IDC SET LEADCHNL RA PACING AMPLITUDE: 1.75 V
MDC IDC SET LEADCHNL RV SENSING SENSITIVITY: 2 mV
MDC IDC STAT BRADY RV PERCENT PACED: 99 %
Pulse Gen Serial Number: 7785935

## 2015-06-30 ENCOUNTER — Ambulatory Visit: Payer: Medicare Other | Admitting: Physician Assistant

## 2015-07-05 ENCOUNTER — Other Ambulatory Visit: Payer: Self-pay

## 2015-07-05 DIAGNOSIS — Z1231 Encounter for screening mammogram for malignant neoplasm of breast: Secondary | ICD-10-CM

## 2015-07-29 ENCOUNTER — Telehealth: Payer: Self-pay | Admitting: Gastroenterology

## 2015-07-29 NOTE — Telephone Encounter (Signed)
Received a fax with patient's lab results from 07/18/2015 - stool testing negative for C Diff. She has a follow up appointment 08/25/15 to discuss these results and establish care with me

## 2015-08-19 ENCOUNTER — Ambulatory Visit
Admission: RE | Admit: 2015-08-19 | Discharge: 2015-08-19 | Disposition: A | Discharge status: Home / Self Care | Payer: Medicare Other | Source: Ambulatory Visit

## 2015-08-19 DIAGNOSIS — Z1231 Encounter for screening mammogram for malignant neoplasm of breast: Secondary | ICD-10-CM

## 2015-08-25 ENCOUNTER — Encounter: Payer: Self-pay | Admitting: Gastroenterology

## 2015-08-25 ENCOUNTER — Ambulatory Visit (INDEPENDENT_AMBULATORY_CARE_PROVIDER_SITE_OTHER): Payer: Medicare Other | Admitting: Gastroenterology

## 2015-08-25 VITALS — BP 122/70 | HR 62 | Ht 66.75 in | Wt 129.8 lb

## 2015-08-25 DIAGNOSIS — K219 Gastro-esophageal reflux disease without esophagitis: Secondary | ICD-10-CM | POA: Diagnosis not present

## 2015-08-25 DIAGNOSIS — R197 Diarrhea, unspecified: Secondary | ICD-10-CM

## 2015-08-25 DIAGNOSIS — R1310 Dysphagia, unspecified: Secondary | ICD-10-CM

## 2015-08-25 MED ORDER — PANTOPRAZOLE SODIUM 40 MG PO TBEC: 40.0000 mg | DELAYED_RELEASE_TABLET | Freq: Every day | ORAL | Status: DC

## 2015-08-25 MED ORDER — PANTOPRAZOLE SODIUM 20 MG PO TBEC
20.0000 mg | DELAYED_RELEASE_TABLET | Freq: Every day | ORAL | Status: DC
Start: 2015-08-25 — End: 2016-01-23

## 2015-08-25 NOTE — Patient Instructions (Addendum)
Please start Benefiber as directed.  Please start Imodium as directed.  Your physician has requested that you go to the basement for lab work before leaving today.   We have sent the following medications to your pharmacy for you to pick up at your convenience: Protonix

## 2015-08-25 NOTE — Progress Notes (Signed)
HPI :  75 y/o female, former patient of Dr. Juanda Chance, here for reassessment. She has a remote history of possible microscopic colitis in 2000, with endoscopic history and pathology results as outlined below. However her last colonoscopy in 2006 and then again in 2011 with random biopsies of the colon did not show any evidence of microscopic colitis. She also has a history of GERD / dysphagia.   She is here today for complaints of diarrhea for 6 months. She is having 2-3 BMs per day. She is having very liquid stools. She has loose stools when she wakes up in the morning. She has had some nocturnal symptoms but not common. She denies significant urgency. She denies any incontinence. No abdominal pains, some mild abdominal cramping. This occurs every morning for her. No blood in the stools. She thinks she has lost about 10 lbs over the past 6 months or so. she has had 2 negative tests for C Diff since symptoms began. She is eating okay but has some poor appetite, but makes herself eat. She endorses a history of reflux in the past which has been bothering her again, she has also had dysphagia as well. She has some rare odynophagia when she swallows and she has dysphagia, particularly to meat. She reports she is having dysphagia daily. She thinks she has had this since Thanksgiving. She has had prior dilations which she thinks provided significant benefit. She had an esophageal stricture noted in 2011 which was dilated and provided relief. Dr. Juanda Chance performed an EGD in 2015 for recurrent dysphagia however the exam was normal without stenosis. The patient was empirically dilated to 16mm [place] which provided significant benefit. She has been on nexium in the past and is concerned about using it longterm due to kidney problems. She thinks she took it for 2 weeks and provided significant benefit for several months. She had benefit of her heartburn from this but has not taken it in a long time. She has had prior  "reactions" to pepcid causing rash, but has not been on zantac before. She has intolerance to omeprazole as it causes "nausea".   She has a pacemaker in place. She also reports a history of a TIA for which she is taking aggrenox. She has been on aggrenox for 6 months or so and she is concerned this is related to her diarrhea. She takes Aleve PRN, she takes this very rarely.   Endoscopic history: EGD 7/15 - normal exam, 16mm Sav[place] done EGD 4/11 - stricture distal esophagus 16mm Savory dilator Colonoscopy 4/11 - normal colon with normal biopsies Colonoscopy 2/06 - normal colon, normal biopsies Colonoscopy 07/00 - normal colon, ? Collagenous colitis, nonspecific chronic inflammation with increased eosinophils   Past Medical History  Diagnosis Date  . Allergic rhinitis   . Hyperparathyroidism (HCC)   . DJD (degenerative joint disease)   . MVP (mitral valve prolapse)     moderate posterior MVP with moderate MR and grade II diasotlic dysfunction  . Hypothyroidism   . Hyperlipidemia   . Anemia   . Small vessel disease, cerebrovascular   . Chronic kidney disease     stage III  . IBS (irritable bowel syndrome)   . Glomerulonephritis     Dr [name]  . PAC (premature atrial contraction)   . Interstitial cystitis   . Hypertension   . Mobitz II     a. s/p STJ dual chamber PPM      Past Surgical History  Procedure Laterality Date  .  Abdominal hysterectomy    . Breast biopsy Right   . Tonsillectomy    . Ep implantable device N/A 01/07/2015    STJ dual chamber pacemaker implanted by Dr [name] for 2:1 heart block  . Peripheral vascular catheterization N/A 02/03/2015    Procedure: Upper Extremity Venography;  Surgeon: [name], MD;  Location: [facility] INVASIVE CV LAB;  Service: Cardiovascular;  Laterality: N/A;   Family History  Problem Relation Age of Onset  . Ovarian cancer Sister   . Colon cancer Neg Hx   . Heart disease Mother   . Heart disease Father   . Heart disease Brother     . Rheum arthritis Sister   . Melanoma Brother   . Brain cancer Brother   . Heart attack Brother   . Heart attack Father   . Stroke Mother   . Hypertension Mother   . Hypertension Brother   . Hypertension Sister    Social History  Substance Use Topics  . Smoking status: Former Games developer  . Smokeless tobacco: Never Used  . Alcohol Use: No   Current Outpatient Prescriptions  Medication Sig Dispense Refill  . amiloride-hydrochlorothiazide (MODURETIC) 5-50 MG tablet Take 1 tablet by mouth daily.    [name] atenolol (TENORMIN) 50 MG tablet Take 50 mg by mouth daily.    . calcitRIOL (ROCALTROL) 0.25 MCG capsule Take 0.25 mcg by mouth every other day.     . conjugated estrogens (PREMARIN) vaginal cream Place 1 Applicatorful vaginally daily.    . Cyanocobalamin (VITAMIN B-12) 1000 MCG SUBL Place 1,000 mcg under the tongue daily.     [name] dipyridamole-aspirin (AGGRENOX) 200-25 MG 12hr capsule Take 1 capsule by mouth 2 (two) times daily.     . fexofenadine (ALLEGRA) 180 MG tablet Take 180 mg by mouth daily.    . fluticasone (FLONASE) 50 MCG/ACT nasal spray Place 2 sprays into both nostrils daily.    . hydrocortisone valerate ointment (WEST-CORT) 0.2 % Apply 1 application topically 2 (two) times daily.    [name] levothyroxine (SYNTHROID, LEVOTHROID) 75 MCG tablet Take 75 mcg by mouth daily at 6 PM.     . Misc Natural Products (TART CHERRY ADVANCED PO) Take 1,200 mg by mouth daily. Tart Cherry    . naproxen sodium (ANAPROX) 220 MG tablet Take 220 mg by mouth 2 (two) times daily as needed (pain).    . Probiotic Product (PROBIOTIC DAILY PO) Take 1 tablet by mouth daily.    . pantoprazole (PROTONIX) 40 MG tablet Take 1 tablet (40 mg total) by mouth daily. Take 1-2 x a day as needed 60 tablet 3   No current facility-administered medications for this visit.   Allergies  Allergen Reactions  . Allopurinol Other (See Comments)    Caused headaches  . Amlodipine Other (See Comments)    PEDAL EDEMA   . Colchicine  Other (See Comments)  . Contrast Media [Iodinated Diagnostic Agents] Hives    IVP dye per patient  . Pepcid [Famotidine] Swelling    "Throat Swelling"   . Prilosec [Omeprazole] Nausea Only  . Atorvastatin   . Amoxicillin Itching and Rash  . Augmentin [Amoxicillin-Pot Clavulanate] Itching and Rash  . Cephalexin Itching and Rash  . Ciprofloxacin Itching and Rash  . Erythromycin Itching and Rash  . Sulfonamide Derivatives Itching and Rash  . Tetracycline Itching and Rash     Review of Systems: All systems reviewed and negative except where noted in HPI.   Lab Results  Component Value Date  WBC 6.4 04/04/2015   HGB 11.0* 04/04/2015   HCT 33.3* 04/04/2015   MCV 90.0 04/04/2015   PLT 102* 04/04/2015    Lab Results  Component Value Date   CREATININE 1.29* 04/04/2015   BUN 20 04/04/2015   NA 136 04/04/2015   K 3.2* 04/04/2015   CL 105 04/04/2015   CO2 25 04/04/2015   Lab Results  Component Value Date   ALT 12* 04/01/2015   AST 17 04/01/2015   ALKPHOS 50 04/01/2015   BILITOT 0.8 04/01/2015      Physical Exam: BP 122/70 mmHg  Pulse 62  Ht 5' 6.75" (1.695 m)  Wt 129 lb 12.8 oz (58.877 kg)  BMI 20.49 kg/m2 Constitutional: Pleasant,well-developed, female in no acute distress. HEENT: Normocephalic and atraumatic. Conjunctivae are normal. No scleral icterus. Neck supple.  Cardiovascular: Normal rate, regular rhythm.  Pulmonary/chest: Effort normal and breath sounds normal. No wheezing, rales or rhonchi. Abdominal: Soft, nondistended, nontender. Bowel sounds active throughout. There are no masses palpable. No hepatomegaly. Extremities: trace edema Lymphadenopathy: No cervical adenopathy noted. Neurological: Alert and oriented to person place and time. Skin: Skin is warm and dry. No rashes noted. Psychiatric: Normal mood and affect. Behavior is normal.   ASSESSMENT AND PLAN: 75 y/o female here to follow up for the following issues as outlined below:  Chronic  diarrhea - history of diarrhea initially attributed to microscopic colitis in 2000 however recent endoscopies have not shown this. She has had negative C Diff testing. Symptoms have bothered her since starting Aggrenox, which can cause chronic diarrhea in 13% of patients from review of drug insert. She can discuss with her ordering provider to determine if this is something that would be able to be held for a short period of time and use something else for TIA prophylaxis to see if symptoms resolve. Otherwise, stool frequency is not considerably abnormal, only form of stool is what is bothering her. Will recommend daily fiber supplement to help bulk stools and she can use immodium PRN to help as well. I will otherwise send fecal calprotectin to assess for any ongoing inflammation as this would be reassuring if normal.   GERD - we discussed options and would prefer to avoid PPI in light of CKD. She has a reported allergy to pepcid (rash) and was hesitant to try Zantac however in this light. She has a reported allergy to omeprazole (nausea). In this light will try 20mg  protonix for a few weeks, not long term, to see if this helps, and she can use PRN in the future or can reconsider a trial of zantac. She agreed.   Dysphagia - I offered her an EGD with empiric dilation given she has had benefit to this in the past, however she wished to hold off for now after discussion of this and wishes to see if her symptoms are helped at all with PPI. If symptoms persist, I asked her to call me and we can discuss performing the EGD. I reassured her there is very unlikely to be malignancy causing this given EGD in 2015.  We can also consider manometry in the future pending her course.   Ileene Patrick, MD [facility] Pager [phone]

## 2015-08-26 ENCOUNTER — Other Ambulatory Visit: Payer: Medicare Other

## 2015-08-26 DIAGNOSIS — R197 Diarrhea, unspecified: Secondary | ICD-10-CM

## 2015-08-29 ENCOUNTER — Telehealth: Payer: Self-pay | Admitting: *Deleted

## 2015-08-29 NOTE — Telephone Encounter (Signed)
Left a message for patient to call back. 

## 2015-08-29 NOTE — Telephone Encounter (Signed)
-----   Message from Ruffin Frederick, MD sent at 08/25/2015  5:17 PM EST ----- Rene Kocher this patient was given protonix at the clinic visit however the wrong dose was ordered. I had wanted 20mg  but 40mg  was placed. Given her CKD I would like the lowest dose possible. Can you please call her to let her know I changed the order and she can pick it up in the pharmacy, or cut the 40mg  tablet in half if she has already picked it up. Thanks

## 2015-08-29 NOTE — Telephone Encounter (Signed)
Spoke with patient and she will adjust the Protonix dose. She states she is now on Plavix and a baby Aspirin.

## 2015-08-29 NOTE — Telephone Encounter (Signed)
Ok thanks for the update. Hopefully being off aggrenox will reduce her diarrhea. Thanks

## 2015-08-31 LAB — CALPROTECTIN, FECAL: Calprotectin, Fecal: 208 ug/g — ABNORMAL HIGH (ref 0–120)

## 2015-09-19 ENCOUNTER — Ambulatory Visit (INDEPENDENT_AMBULATORY_CARE_PROVIDER_SITE_OTHER): Payer: Medicare Other | Admitting: *Deleted

## 2015-09-19 DIAGNOSIS — I441 Atrioventricular block, second degree: Secondary | ICD-10-CM

## 2015-09-19 NOTE — Progress Notes (Signed)
Remote pacemaker transmission.   

## 2015-10-18 LAB — CUP PACEART REMOTE DEVICE CHECK
Brady Statistic AP VP Percent: 15 %
Brady Statistic AP VS Percent: 1 %
Brady Statistic AS VP Percent: 82 %
Brady Statistic AS VS Percent: 1 %
Brady Statistic RV Percent Paced: 98 %
Date Time Interrogation Session: 20170313060014
Implantable Lead Implant Date: 20160701
Implantable Lead Location: 753859
Lead Channel Impedance Value: 550 Ohm
Lead Channel Pacing Threshold Amplitude: 0.875 V
Lead Channel Pacing Threshold Pulse Width: 0.4 ms
Lead Channel Sensing Intrinsic Amplitude: 2.8 mV
Lead Channel Setting Pacing Amplitude: 1.125
Lead Channel Setting Pacing Amplitude: 1.625
Lead Channel Setting Pacing Pulse Width: 0.4 ms
Lead Channel Setting Sensing Sensitivity: 2 mV
MDC IDC LEAD IMPLANT DT: 20160701
MDC IDC LEAD LOCATION: 753860
MDC IDC MSMT BATTERY REMAINING LONGEVITY: 126 mo
MDC IDC MSMT BATTERY REMAINING PERCENTAGE: 95.5 %
MDC IDC MSMT BATTERY VOLTAGE: 3.02 V
MDC IDC MSMT LEADCHNL RA IMPEDANCE VALUE: 490 Ohm
MDC IDC MSMT LEADCHNL RA PACING THRESHOLD AMPLITUDE: 0.625 V
MDC IDC MSMT LEADCHNL RA PACING THRESHOLD PULSEWIDTH: 0.4 ms
MDC IDC MSMT LEADCHNL RA SENSING INTR AMPL: 2.9 mV
MDC IDC PG SERIAL: 7785935
MDC IDC STAT BRADY RA PERCENT PACED: 14 %

## 2015-10-19 ENCOUNTER — Encounter: Payer: Self-pay | Admitting: Cardiology

## 2015-12-15 ENCOUNTER — Ambulatory Visit (HOSPITAL_COMMUNITY): Payer: Medicare Other | Attending: Cardiovascular Disease

## 2015-12-15 ENCOUNTER — Other Ambulatory Visit: Payer: Self-pay

## 2015-12-15 DIAGNOSIS — I340 Nonrheumatic mitral (valve) insufficiency: Secondary | ICD-10-CM

## 2015-12-15 DIAGNOSIS — I341 Nonrheumatic mitral (valve) prolapse: Secondary | ICD-10-CM | POA: Insufficient documentation

## 2015-12-15 DIAGNOSIS — I071 Rheumatic tricuspid insufficiency: Secondary | ICD-10-CM | POA: Diagnosis not present

## 2015-12-15 DIAGNOSIS — I1310 Hypertensive heart and chronic kidney disease without heart failure, with stage 1 through stage 4 chronic kidney disease, or unspecified chronic kidney disease: Secondary | ICD-10-CM | POA: Insufficient documentation

## 2015-12-15 DIAGNOSIS — N189 Chronic kidney disease, unspecified: Secondary | ICD-10-CM | POA: Insufficient documentation

## 2015-12-15 DIAGNOSIS — E785 Hyperlipidemia, unspecified: Secondary | ICD-10-CM | POA: Insufficient documentation

## 2015-12-15 DIAGNOSIS — I059 Rheumatic mitral valve disease, unspecified: Secondary | ICD-10-CM | POA: Diagnosis present

## 2015-12-15 LAB — ECHOCARDIOGRAM COMPLETE
AVLVOTPG: 2 mmHg
Ao-asc: 34 cm
CHL CUP MV DEC (S): 261
CHL CUP TV REG PEAK VELOCITY: 274 cm/s
EERAT: 15.23
EWDT: 261 ms
FS: 37 % (ref 28–44)
IV/PV OW: 1.12
LA diam end sys: 38 mm
LA diam index: 2.29 cm/m2
LA vol A4C: 44 ml
LA vol: 56 mL
LASIZE: 38 mm
LAVOLIN: 33.7 mL/m2
LV E/e' medial: 15.23
LV PW d: 11.2 mm — AB (ref 0.6–1.1)
LV TDI E'MEDIAL: 8.33
LVEEAVG: 15.23
LVELAT: 7.68 cm/s
LVOT SV: 61 mL
LVOT VTI: 17.7 cm
LVOT area: 3.46 cm2
LVOTD: 21 mm
LVOTPV: 67.7 cm/s
MV Peak grad: 5 mmHg
MVPKAVEL: 89.4 m/s
MVPKEVEL: 117 m/s
TDI e' lateral: 7.68
TR max vel: 274 cm/s

## 2015-12-19 ENCOUNTER — Telehealth: Payer: Self-pay | Admitting: Cardiology

## 2015-12-19 DIAGNOSIS — I341 Nonrheumatic mitral (valve) prolapse: Secondary | ICD-10-CM

## 2015-12-19 DIAGNOSIS — I340 Nonrheumatic mitral (valve) insufficiency: Secondary | ICD-10-CM

## 2015-12-19 NOTE — Telephone Encounter (Signed)
New message ° ° ° ° ° °Returning a call to the nurse to get echo results °

## 2015-12-19 NOTE — Telephone Encounter (Signed)
-----   Message from Quintella Reichert, MD sent at 12/15/2015  6:24 PM EDT ----- Please repeat echo in 6 months

## 2015-12-19 NOTE — Telephone Encounter (Signed)
Informed patient of results and verbal understanding expressed.  Repeat ECHO ordered to be scheduled in 6 months. Patient agrees with treatment plan. 

## 2016-01-23 ENCOUNTER — Encounter: Payer: Self-pay | Admitting: Internal Medicine

## 2016-01-23 ENCOUNTER — Ambulatory Visit (INDEPENDENT_AMBULATORY_CARE_PROVIDER_SITE_OTHER): Payer: Medicare Other | Admitting: Internal Medicine

## 2016-01-23 ENCOUNTER — Encounter (INDEPENDENT_AMBULATORY_CARE_PROVIDER_SITE_OTHER): Payer: Self-pay

## 2016-01-23 VITALS — BP 140/74 | HR 72 | Ht 67.0 in | Wt 138.0 lb

## 2016-01-23 DIAGNOSIS — Z950 Presence of cardiac pacemaker: Secondary | ICD-10-CM

## 2016-01-23 DIAGNOSIS — I441 Atrioventricular block, second degree: Secondary | ICD-10-CM

## 2016-01-23 LAB — CUP PACEART INCLINIC DEVICE CHECK
Battery Remaining Longevity: 122.4
Battery Voltage: 3.01 V
Brady Statistic RA Percent Paced: 27 %
Brady Statistic RV Percent Paced: 98 %
Date Time Interrogation Session: 20170717173715
Implantable Lead Location: 753859
Implantable Lead Location: 753860
Lead Channel Impedance Value: 487.5 Ohm
Lead Channel Pacing Threshold Amplitude: 0.625 V
Lead Channel Sensing Intrinsic Amplitude: 3.5 mV
Lead Channel Setting Pacing Pulse Width: 0.4 ms
MDC IDC LEAD IMPLANT DT: 20160701
MDC IDC LEAD IMPLANT DT: 20160701
MDC IDC MSMT LEADCHNL RA PACING THRESHOLD PULSEWIDTH: 0.4 ms
MDC IDC MSMT LEADCHNL RV IMPEDANCE VALUE: 587.5 Ohm
MDC IDC MSMT LEADCHNL RV PACING THRESHOLD AMPLITUDE: 1 V
MDC IDC MSMT LEADCHNL RV PACING THRESHOLD PULSEWIDTH: 0.4 ms
MDC IDC MSMT LEADCHNL RV SENSING INTR AMPL: 2.8 mV
MDC IDC PG SERIAL: 7785935
MDC IDC SET LEADCHNL RA PACING AMPLITUDE: 1.625
MDC IDC SET LEADCHNL RV PACING AMPLITUDE: 1.25 V
MDC IDC SET LEADCHNL RV SENSING SENSITIVITY: 2 mV
Pulse Gen Model: 2240

## 2016-01-23 NOTE — Patient Instructions (Signed)
Medication Instructions:  Your physician recommends that you continue on your current medications as directed. Please refer to the Current Medication list given to you today.   Labwork: None ordered   Testing/Procedures: None ordered   Follow-Up: Your physician wants you to follow-up in: 12 months with Dr. [name].  You will receive a reminder letter in the mail two months in advance. If you don't receive a letter, please call our office to schedule the follow-up appointment.  Remote monitoring is used to monitor your Pacemaker from home. This monitoring reduces the number of office visits required to check your device to one time per year. It allows [place] to keep an eye on the functioning of your device to ensure it is working properly. You are scheduled for a device check from home on 04/23/16. You may send your transmission at any time that day. If you have a wireless device, the transmission will be sent automatically. After your physician reviews your transmission, you will receive a postcard with your next transmission date.    Any Other Special Instructions Will Be Listed Below (If Applicable).     If you need a refill on your cardiac medications before your next appointment, please call your pharmacy.

## 2016-01-23 NOTE — Progress Notes (Signed)
HPI Amanda [name] returns today for followup. She is a pleasant 75 yo woman with symptomatic 2:1 AV block, and mitral regurge who underwent PPM insertion 12 months ago. Her left arm swelling has improved. She has been stable since and notes that her dyspnea is improved. No edema or chest pressure. She is pending repeat ultrasound.  Allergies  Allergen Reactions  . Pepcid [Famotidine] Swelling    "Throat Swelling"   . Allopurinol Other (See Comments)    Caused headaches  . Amlodipine Other (See Comments)    PEDAL EDEMA   . Colchicine Other (See Comments)    unknown  . Contrast Media [Iodinated Diagnostic Agents] Hives    IVP dye per patient  . Prilosec [Omeprazole] Nausea Only  . Atorvastatin     unknown  . Augmentin [Amoxicillin-Pot Clavulanate] Itching and Rash  . Cephalexin Itching and Rash  . Ciprofloxacin Itching and Rash  . Erythromycin Itching and Rash  . Sulfonamide Derivatives Itching and Rash  . Tetracycline Itching and Rash     Current Outpatient Prescriptions  Medication Sig Dispense Refill  . amiloride-hydrochlorothiazide (MODURETIC) 5-50 MG tablet Take 1 tablet by mouth daily.    [name] aspirin EC 81 MG tablet Take 81 mg by mouth daily.    [name] atenolol (TENORMIN) 50 MG tablet Take 50 mg by mouth daily.    . calcitRIOL (ROCALTROL) 0.25 MCG capsule Take 0.25 mcg by mouth every other day.     . clopidogrel (PLAVIX) 75 MG tablet Take 75 mg by mouth daily.    [name] conjugated estrogens (PREMARIN) vaginal cream Place 1 Applicatorful vaginally daily as needed (moisture).     . Cyanocobalamin (VITAMIN B-12) 1000 MCG SUBL Place 1,000 mcg under the tongue daily.     . fexofenadine (ALLEGRA) 180 MG tablet Take 180 mg by mouth daily.    . fluticasone (FLONASE) 50 MCG/ACT nasal spray Place 2 sprays into both nostrils daily.    . hydrocortisone valerate ointment (WEST-CORT) 0.2 % Apply 1 application topically 2 (two) times daily as needed (itching).     [name] levothyroxine (SYNTHROID,  LEVOTHROID) 75 MCG tablet Take 75 mcg by mouth daily at 6 PM.     . Misc Natural Products (TART CHERRY ADVANCED PO) Take 1,200 mg by mouth daily. Tart Cherry    . naproxen sodium (ANAPROX) 220 MG tablet Take 220 mg by mouth 2 (two) times daily as needed (pain).    . Probiotic Product (PROBIOTIC DAILY PO) Take 1 tablet by mouth daily.     No current facility-administered medications for this visit.     Past Medical History  Diagnosis Date  . Allergic rhinitis   . Hyperparathyroidism (HCC)   . DJD (degenerative joint disease)   . MVP (mitral valve prolapse)     moderate posterior MVP with moderate MR and grade II diasotlic dysfunction  . Hypothyroidism   . Hyperlipidemia   . Anemia   . Small vessel disease, cerebrovascular   . Chronic kidney disease     stage III  . IBS (irritable bowel syndrome)   . Glomerulonephritis     Dr [name]  . PAC (premature atrial contraction)   . Interstitial cystitis   . Hypertension   . Mobitz II     a. s/p STJ dual chamber PPM     ROS:   All systems reviewed and negative except as noted in the HPI.   Past Surgical History  Procedure Laterality Date  . Abdominal hysterectomy    .  Breast biopsy Right   . Tonsillectomy    . Ep implantable device N/A 01/07/2015    STJ dual chamber pacemaker implanted by Dr [name] for 2:1 heart block  . Peripheral vascular catheterization N/A 02/03/2015    Procedure: Upper Extremity Venography;  Surgeon: [name], MD;  Location: [facility] INVASIVE CV LAB;  Service: Cardiovascular;  Laterality: N/A;     Family History  Problem Relation Age of Onset  . Ovarian cancer Sister   . Colon cancer Neg Hx   . Heart disease Mother   . Heart disease Father   . Heart disease Brother   . Rheum arthritis Sister   . Melanoma Brother   . Brain cancer Brother   . Heart attack Brother   . Heart attack Father   . Stroke Mother   . Hypertension Mother   . Hypertension Brother   . Hypertension Sister      Social  History   Social History  . Marital Status: Married    Spouse Name: N/A  . Number of Children: 2  . Years of Education: N/A   Occupational History  . retired    Social History Main Topics  . Smoking status: Former Games developer  . Smokeless tobacco: Never Used  . Alcohol Use: No  . Drug Use: No  . Sexual Activity: Not on file   Other Topics Concern  . Not on file   Social History Narrative   Patient drinks 1-2 cups of caffeine daily.   Patient is right handed.      BP 140/74 mmHg  Pulse 72  Ht 5\' 7"  (1.702 m)  Wt 138 lb (62.596 kg)  BMI 21.61 kg/m2  Physical Exam:  Well appearing 75 yo woman, NAD HEENT: Unremarkable Neck: 6 cm JVD, no thyromegally Lymphatics:  No adenopathy Back:  No CVA tenderness Lungs:  Clear with no wheezes HEART:  Regular rate rhythm, no murmurs, no rubs, no clicks Abd:  soft, positive bowel sounds, no organomegally, no rebound, no guarding Ext:  2 plus pulses, no edema, no cyanosis, no clubbing Skin:  No rashes no nodules Neuro:  CN II through XII intact, motor grossly intact  EKG - p synchronous ventricular pacing  DEVICE  Normal device function.  See PaceArt for details.   Assess/Plan: 1. Complete heart block - she is doing well, s/p PPM insertion. 2. HTN - her blood pressure is reasonably well controlled. No change in meds. 3. MR - she is pending followup with [name]. She will undergo repeat 2D echo.  [name]

## 2016-02-18 ENCOUNTER — Other Ambulatory Visit: Payer: Self-pay | Admitting: Cardiology

## 2016-03-02 ENCOUNTER — Encounter: Payer: Self-pay | Admitting: Cardiology

## 2016-03-15 NOTE — Progress Notes (Signed)
Cardiology Office Note    Date:  03/16/2016   ID:  Amanda [name], DOB 04/20/1941, MRN [id number]  PCP:  Gweneth DimitriMCNEILL,WENDY, MD  Cardiologist:  Armanda Magicraci Jhonny Calixto, MD   Chief Complaint  Patient presents with  . Follow-up    heart block s/p PPM, MVP with MR, PACs    History of Present Illness:  Amanda [name] is a 75 y.o. female with a history of PAC's, MVP with moderate MR,  Second degree AV block s/p PPM and HTN who presents today for followup. She is doing well. She denies any chest pain, SOB, DOE, LE edema, dizziness, palpitations or syncope.     Past Medical History:  Diagnosis Date  . Allergic rhinitis   . Anemia   . Chronic kidney disease    stage III  . DJD (degenerative joint disease)   . Glomerulonephritis    Dr Darrick Pennaeterding  . Hyperlipidemia   . Hyperparathyroidism (HCC)   . Hypertension   . Hypothyroidism   . IBS (irritable bowel syndrome)   . Interstitial cystitis   . Mobitz II    a. s/p STJ dual chamber PPM   . MVP (mitral valve prolapse)    moderate posterior MVP with moderate MR and grade II diasotlic dysfunction  . PAC (premature atrial contraction)   . Small vessel disease, cerebrovascular     Past Surgical History:  Procedure Laterality Date  . ABDOMINAL HYSTERECTOMY    . BREAST BIOPSY Right   . EP IMPLANTABLE DEVICE N/A 01/07/2015   STJ dual chamber pacemaker implanted by Dr Ladona Ridgelaylor for 2:1 heart block  . PERIPHERAL VASCULAR CATHETERIZATION N/A 02/03/2015   Procedure: Upper Extremity Venography;  Surgeon: Duke SalviaSteven C Klein, MD;  [facility];  Service: Cardiovascular;  Laterality: N/A;  . TONSILLECTOMY      Current Medications: Outpatient Medications Prior to Visit  Medication Sig Dispense Refill  . amiloride-hydrochlorothiazide (MODURETIC) 5-50 MG tablet Take 1 tablet by mouth daily.    [name]. aspirin EC 81 MG tablet Take 81 mg by mouth daily.    [name]. atenolol (TENORMIN) 50 MG tablet Take 50 mg by mouth daily.    . calcitRIOL  (ROCALTROL) 0.25 MCG capsule Take 0.25 mcg by mouth every other day.     . clopidogrel (PLAVIX) 75 MG tablet Take 75 mg by mouth daily.    [name]. conjugated estrogens (PREMARIN) vaginal cream Place 1 Applicatorful vaginally daily as needed (moisture).     . Cyanocobalamin (VITAMIN B-12) 1000 MCG SUBL Place 1,000 mcg under the tongue daily.     . fexofenadine (ALLEGRA) 180 MG tablet Take 180 mg by mouth daily.    . fluticasone (FLONASE) 50 MCG/ACT nasal spray Place 2 sprays into both nostrils daily.    . hydrocortisone valerate ointment (WEST-CORT) 0.2 % Apply 1 application topically 2 (two) times daily as needed (itching).     [name]. levothyroxine (SYNTHROID, LEVOTHROID) 75 MCG tablet Take 75 mcg by mouth daily at 6 PM.     . Misc Natural Products (TART CHERRY ADVANCED PO) Take 1,200 mg by mouth daily. Tart Cherry    . naproxen sodium (ANAPROX) 220 MG tablet Take 220 mg by mouth 2 (two) times daily as needed (pain).    . Probiotic Product (PROBIOTIC DAILY PO) Take 1 tablet by mouth daily.    [name]. atenolol (TENORMIN) 50 MG tablet TAKE ONE TABLET TWICE DAILY (Patient not taking: Reported on 03/16/2016) 60 tablet 11   No facility-administered medications prior to visit.  Allergies:   Pepcid [famotidine]; Allopurinol; Amlodipine; Colchicine; Contrast media [iodinated diagnostic agents]; Prilosec [omeprazole]; Atorvastatin; Augmentin [amoxicillin-pot clavulanate]; Cephalexin; Ciprofloxacin; Erythromycin; Sulfonamide derivatives; and Tetracycline   Social History   Social History  . Marital status: Married    Spouse name: N/A  . Number of children: 2  . Years of education: N/A   Occupational History  . retired    Social History Main Topics  . Smoking status: Former Games developer  . Smokeless tobacco: Never Used  . Alcohol use No  . Drug use: No  . Sexual activity: Not Asked   Other Topics Concern  . None   Social History Narrative   Patient drinks 1-2 cups of caffeine daily.   Patient is right  handed.      Family History:  The patient's family history includes Brain cancer in her brother; Heart attack in her brother and father; Heart disease in her brother, father, and mother; Hypertension in her brother, mother, and sister; Melanoma in her brother; Ovarian cancer in her sister; Rheum arthritis in her sister; Stroke in her mother.   ROS:   Please see the history of present illness.    ROS All other systems reviewed and are negative.   PHYSICAL EXAM:   VS:  BP 132/72   Pulse 60   Ht 5\' 7"  (1.702 m)   Wt 139 lb (63 kg)   BMI 21.77 kg/m    GEN: Well nourished, well developed, in no acute distress  HEENT: normal  Neck: no JVD, carotid bruits, or masses Cardiac: RRR; no murmurs, rubs, or gallops,no edema.  Intact distal pulses bilaterally.  Respiratory:  clear to auscultation bilaterally, normal work of breathing GI: soft, nontender, nondistended, + BS MS: no deformity or atrophy  Skin: warm and dry, no rash Neuro:  Alert and Oriented x 3, Strength and sensation are intact Psych: euthymic mood, full affect  Wt Readings from Last 3 Encounters:  03/16/16 139 lb (63 kg)  01/23/16 138 lb (62.6 kg)  08/25/15 129 lb 12.8 oz (58.9 kg)      Studies/Labs Reviewed:   EKG:  EKG is ordered today and showed AV paced rhythm at 60bpm   Recent Labs: 04/01/2015: ALT 12 04/03/2015: TSH 0.367 04/04/2015: BUN 20; Creatinine, Ser 1.29; Hemoglobin 11.0; Magnesium 1.9; Platelets 102; Potassium 3.2; Sodium 136   Lipid Panel    Component Value Date/Time   CHOL 153 01/07/2015 0304   TRIG 67 01/07/2015 0304   HDL 51 01/07/2015 0304   CHOLHDL 3.0 01/07/2015 0304   VLDL 13 01/07/2015 0304   LDLCALC 89 01/07/2015 0304    Additional studies/ records that were reviewed today include:  none    ASSESSMENT:    1. PAC (premature atrial contraction)   2. Mitral regurgitation   3. MVP (mitral valve prolapse)   4. AV block   5. Essential hypertension     PLAN:  In order of problems  listed above:  1. PACs - asymptomatic 2. Mitral regurgitation - moderate to severe by echo 12/2015.  Repeat echo 05/2016 for progression. 3. MVP - see #2 4. 2nd degree AV block s/p PPM and followed in pacer clinic 5. HTN - BP controlled.  Continue diuretic, BB.  Check BMET.     Medication Adjustments/Labs and Tests Ordered: Current medicines are reviewed at length with the patient today.  Concerns regarding medicines are outlined above.  Medication changes, Labs and Tests ordered today are listed in the Patient Instructions below.  There are no  Patient Instructions on file for this visit.   Signed, Armanda Magic, MD  03/16/2016 8:27 AM    [facility] [address] [place], [place], [place]  [address] Phone: [phone]; Fax: [phone]

## 2016-03-16 ENCOUNTER — Ambulatory Visit (INDEPENDENT_AMBULATORY_CARE_PROVIDER_SITE_OTHER): Payer: Medicare Other | Admitting: Cardiology

## 2016-03-16 ENCOUNTER — Encounter: Payer: Self-pay | Admitting: Cardiology

## 2016-03-16 VITALS — BP 132/72 | HR 60 | Ht 67.0 in | Wt 139.0 lb

## 2016-03-16 DIAGNOSIS — I341 Nonrheumatic mitral (valve) prolapse: Secondary | ICD-10-CM

## 2016-03-16 DIAGNOSIS — I340 Nonrheumatic mitral (valve) insufficiency: Secondary | ICD-10-CM

## 2016-03-16 DIAGNOSIS — I491 Atrial premature depolarization: Secondary | ICD-10-CM

## 2016-03-16 DIAGNOSIS — I10 Essential (primary) hypertension: Secondary | ICD-10-CM

## 2016-03-16 DIAGNOSIS — I4430 Unspecified atrioventricular block: Secondary | ICD-10-CM

## 2016-03-16 LAB — BASIC METABOLIC PANEL
BUN: 27 mg/dL — AB (ref 7–25)
CALCIUM: 9.8 mg/dL (ref 8.6–10.4)
CHLORIDE: 97 mmol/L — AB (ref 98–110)
CO2: 29 mmol/L (ref 20–31)
CREATININE: 1.43 mg/dL — AB (ref 0.60–0.93)
Glucose, Bld: 81 mg/dL (ref 65–99)
Potassium: 3.9 mmol/L (ref 3.5–5.3)
Sodium: 136 mmol/L (ref 135–146)

## 2016-03-16 NOTE — Patient Instructions (Signed)

## 2016-04-23 ENCOUNTER — Ambulatory Visit (INDEPENDENT_AMBULATORY_CARE_PROVIDER_SITE_OTHER): Payer: Medicare Other | Admitting: *Deleted

## 2016-04-23 DIAGNOSIS — I441 Atrioventricular block, second degree: Secondary | ICD-10-CM

## 2016-04-23 NOTE — Progress Notes (Signed)
Remote pacemaker transmission.   

## 2016-04-25 ENCOUNTER — Encounter: Payer: Self-pay | Admitting: Cardiology

## 2016-05-04 LAB — CUP PACEART REMOTE DEVICE CHECK
Battery Remaining Percentage: 95.5 %
Battery Voltage: 3.01 V
Brady Statistic AP VP Percent: 39 %
Brady Statistic AS VP Percent: 60 %
Brady Statistic RA Percent Paced: 39 %
Implantable Lead Implant Date: 20160701
Implantable Lead Location: 753860
Lead Channel Impedance Value: 560 Ohm
Lead Channel Pacing Threshold Amplitude: 0.625 V
Lead Channel Pacing Threshold Pulse Width: 0.4 ms
Lead Channel Sensing Intrinsic Amplitude: 11.8 mV
Lead Channel Setting Pacing Amplitude: 1.625
MDC IDC LEAD IMPLANT DT: 20160701
MDC IDC LEAD LOCATION: 753859
MDC IDC MSMT BATTERY REMAINING LONGEVITY: 130 mo
MDC IDC MSMT LEADCHNL RA IMPEDANCE VALUE: 460 Ohm
MDC IDC MSMT LEADCHNL RA SENSING INTR AMPL: 2.4 mV
MDC IDC MSMT LEADCHNL RV PACING THRESHOLD AMPLITUDE: 1 V
MDC IDC MSMT LEADCHNL RV PACING THRESHOLD PULSEWIDTH: 0.4 ms
MDC IDC PG SERIAL: 7785935
MDC IDC SESS DTM: 20171016070921
MDC IDC SET LEADCHNL RV PACING AMPLITUDE: 1.25 V
MDC IDC SET LEADCHNL RV PACING PULSEWIDTH: 0.4 ms
MDC IDC SET LEADCHNL RV SENSING SENSITIVITY: 2 mV
MDC IDC STAT BRADY AP VS PERCENT: 1 %
MDC IDC STAT BRADY AS VS PERCENT: 1 %
MDC IDC STAT BRADY RV PERCENT PACED: 99 %

## 2016-06-13 ENCOUNTER — Telehealth: Payer: Self-pay | Admitting: Cardiology

## 2016-06-13 NOTE — Telephone Encounter (Signed)
New message  Pt call requesting to speak with RN about scheduling an echo. Please call back to discuss

## 2016-06-13 NOTE — Telephone Encounter (Signed)
Scheduling called this pt back and scheduled her 6 month follow-up echo, per Dr [name], for 07/11/16 at 1000.  Pt agreed to appt date and time.

## 2016-07-11 ENCOUNTER — Other Ambulatory Visit: Payer: Self-pay

## 2016-07-11 ENCOUNTER — Ambulatory Visit (HOSPITAL_COMMUNITY): Payer: Medicare Other | Attending: Cardiovascular Disease

## 2016-07-11 DIAGNOSIS — I340 Nonrheumatic mitral (valve) insufficiency: Secondary | ICD-10-CM | POA: Diagnosis not present

## 2016-07-11 DIAGNOSIS — Z8249 Family history of ischemic heart disease and other diseases of the circulatory system: Secondary | ICD-10-CM | POA: Diagnosis not present

## 2016-07-11 DIAGNOSIS — Z87891 Personal history of nicotine dependence: Secondary | ICD-10-CM | POA: Insufficient documentation

## 2016-07-11 DIAGNOSIS — I441 Atrioventricular block, second degree: Secondary | ICD-10-CM | POA: Insufficient documentation

## 2016-07-11 DIAGNOSIS — I071 Rheumatic tricuspid insufficiency: Secondary | ICD-10-CM | POA: Diagnosis not present

## 2016-07-11 DIAGNOSIS — I371 Nonrheumatic pulmonary valve insufficiency: Secondary | ICD-10-CM | POA: Insufficient documentation

## 2016-07-11 DIAGNOSIS — E785 Hyperlipidemia, unspecified: Secondary | ICD-10-CM | POA: Insufficient documentation

## 2016-07-11 DIAGNOSIS — N189 Chronic kidney disease, unspecified: Secondary | ICD-10-CM | POA: Diagnosis not present

## 2016-07-11 DIAGNOSIS — I341 Nonrheumatic mitral (valve) prolapse: Secondary | ICD-10-CM | POA: Diagnosis not present

## 2016-07-11 DIAGNOSIS — I129 Hypertensive chronic kidney disease with stage 1 through stage 4 chronic kidney disease, or unspecified chronic kidney disease: Secondary | ICD-10-CM | POA: Insufficient documentation

## 2016-07-13 ENCOUNTER — Telehealth: Payer: Self-pay

## 2016-07-13 DIAGNOSIS — I341 Nonrheumatic mitral (valve) prolapse: Secondary | ICD-10-CM

## 2016-07-13 DIAGNOSIS — I340 Nonrheumatic mitral (valve) insufficiency: Secondary | ICD-10-CM

## 2016-07-13 NOTE — Telephone Encounter (Signed)
-----   Message from Quintella Reichert, MD sent at 07/11/2016  3:07 PM EST ----- Echo showed normal LVF with grade 2 DD, moderate MVP of the posterior MVL with moderate to severe MR - possibly severe but only mid to late systole.  No pulmonary HTN.  Repeat echo in 6 months.  Please have patient call if she starts to develop SOB, LE edema, exertional fatigue

## 2016-07-13 NOTE — Telephone Encounter (Signed)
Please have her stop moduretic and check BP daily for a week and call with results

## 2016-07-13 NOTE — Telephone Encounter (Signed)
Informed patient of results and verbal understanding expressed.  Repeat ECHO ordered to be scheduled in 6 months. She understands to call prior to that time if she develops SOB, LE edema, or exertional fatigue. Patient agrees with treatment plan.   When speaking with patient, she mentioned her BP has been running "a little low" and she has experienced some intermittent mild lightheadedness for the last week. She cannot associate the lightheadedness with any movement or cause. She denies any other cardiac symptoms (CP, SOB, swelling, palpitations). She states she "has had a lot of UTIs and infections lately" for which she's been on several medications. She is not sure if the lightheadedness is due to antibiotics, UTI symptoms or her BP medications. Recent VS readings: 12/20: BP 102/60  HR 65 1/1:     BP 99/57    HR 65 1/2:     BP 129/75            BP 114/58  HR 50 1/3:     BP 95/43    HR 64 She is taking all medications as directed.  She has tried to stay well-hydrated.  To [name] for recommendations.

## 2016-07-16 NOTE — Telephone Encounter (Signed)
Instructed patient to STOP MODURETIC. She will check BP and daily weights and call back in 1 week to follow up.  She agrees with treatment plan.

## 2016-07-20 ENCOUNTER — Other Ambulatory Visit: Payer: Self-pay | Admitting: Family Medicine

## 2016-07-20 DIAGNOSIS — Z1231 Encounter for screening mammogram for malignant neoplasm of breast: Secondary | ICD-10-CM

## 2016-07-23 ENCOUNTER — Ambulatory Visit (INDEPENDENT_AMBULATORY_CARE_PROVIDER_SITE_OTHER): Payer: Medicare Other | Admitting: *Deleted

## 2016-07-23 DIAGNOSIS — I441 Atrioventricular block, second degree: Secondary | ICD-10-CM | POA: Diagnosis not present

## 2016-07-23 LAB — CUP PACEART REMOTE DEVICE CHECK
Battery Remaining Longevity: 127 mo
Battery Voltage: 3.01 V
Brady Statistic AS VS Percent: 1 %
Brady Statistic RA Percent Paced: 39 %
Date Time Interrogation Session: 20180115075207
Implantable Lead Implant Date: 20160701
Implantable Lead Location: 753860
Implantable Pulse Generator Implant Date: 20160701
Lead Channel Impedance Value: 430 Ohm
Lead Channel Pacing Threshold Amplitude: 0.625 V
Lead Channel Pacing Threshold Pulse Width: 0.4 ms
Lead Channel Pacing Threshold Pulse Width: 0.4 ms
MDC IDC LEAD IMPLANT DT: 20160701
MDC IDC LEAD LOCATION: 753859
MDC IDC MSMT BATTERY REMAINING PERCENTAGE: 95.5 %
MDC IDC MSMT LEADCHNL RA SENSING INTR AMPL: 2.6 mV
MDC IDC MSMT LEADCHNL RV IMPEDANCE VALUE: 540 Ohm
MDC IDC MSMT LEADCHNL RV PACING THRESHOLD AMPLITUDE: 1 V
MDC IDC MSMT LEADCHNL RV SENSING INTR AMPL: 12 mV
MDC IDC PG SERIAL: 7785935
MDC IDC SET LEADCHNL RA PACING AMPLITUDE: 1.625
MDC IDC SET LEADCHNL RV PACING AMPLITUDE: 1.25 V
MDC IDC SET LEADCHNL RV PACING PULSEWIDTH: 0.4 ms
MDC IDC SET LEADCHNL RV SENSING SENSITIVITY: 2 mV
MDC IDC STAT BRADY AP VP PERCENT: 39 %
MDC IDC STAT BRADY AP VS PERCENT: 1 %
MDC IDC STAT BRADY AS VP PERCENT: 60 %
MDC IDC STAT BRADY RV PERCENT PACED: 99 %
Pulse Gen Model: 2240

## 2016-07-23 NOTE — Progress Notes (Signed)
Remote pacemaker transmission.   

## 2016-07-24 ENCOUNTER — Encounter: Payer: Self-pay | Admitting: Cardiology

## 2016-07-24 DIAGNOSIS — I480 Paroxysmal atrial fibrillation: Secondary | ICD-10-CM | POA: Insufficient documentation

## 2016-07-27 ENCOUNTER — Telehealth: Payer: Self-pay

## 2016-07-27 DIAGNOSIS — I4819 Other persistent atrial fibrillation: Secondary | ICD-10-CM

## 2016-07-27 DIAGNOSIS — I480 Paroxysmal atrial fibrillation: Secondary | ICD-10-CM

## 2016-07-27 NOTE — Telephone Encounter (Signed)
-----   Message from Quintella Reichert, MD sent at 07/24/2016  8:02 PM EST ----- Patient had PAF noted on Pacer check for over 1 hour and 46 minutes.  Her CHADS2VASC score is 4 and therefore at risk for cardioembolic events.  Will have her stop ASA and Plavix and start Eliquis 5mg  BID.  Please have her come in for a BMET

## 2016-07-27 NOTE — Telephone Encounter (Signed)
Informed patient of results and verbal understanding expressed.   Patient wishes to have labs drawn prior to changing medications. BMET scheduled for Monday. Patient agrees with treatment plan.

## 2016-07-30 ENCOUNTER — Other Ambulatory Visit: Payer: Medicare Other | Admitting: *Deleted

## 2016-07-30 DIAGNOSIS — I480 Paroxysmal atrial fibrillation: Secondary | ICD-10-CM

## 2016-07-31 LAB — BASIC METABOLIC PANEL
BUN/Creatinine Ratio: 19 (ref 12–28)
BUN: 25 mg/dL (ref 8–27)
CALCIUM: 9.4 mg/dL (ref 8.7–10.3)
CHLORIDE: 104 mmol/L (ref 96–106)
CO2: 26 mmol/L (ref 18–29)
Creatinine, Ser: 1.33 mg/dL — ABNORMAL HIGH (ref 0.57–1.00)
GFR calc Af Amer: 44 mL/min/{1.73_m2} — ABNORMAL LOW (ref >59)
GFR calc non Af Amer: 39 mL/min/{1.73_m2} — ABNORMAL LOW (ref >59)
Glucose: 90 mg/dL (ref 65–99)
POTASSIUM: 3.6 mmol/L (ref 3.5–5.2)
Sodium: 145 mmol/L — ABNORMAL HIGH (ref 134–144)

## 2016-08-01 ENCOUNTER — Encounter: Payer: Self-pay | Admitting: Cardiology

## 2016-08-01 ENCOUNTER — Telehealth: Payer: Self-pay

## 2016-08-01 MED ORDER — APIXABAN 5 MG PO TABS: 5.0000 mg | ORAL_TABLET | Freq: Two times a day (BID) | ORAL | 11 refills | Status: DC

## 2016-08-01 NOTE — Telephone Encounter (Signed)
Notes Recorded by Henrietta Dine, RN on 08/01/2016 at 5:10 PM EST Per [name], instructed patient to start Eliquis 5 mg BID.  Patient is to continue Moduretic for the time being and call Dr. [name] for further follow up of swelling. Patient agrees with treatment plan and was grateful for call. ------  Notes Recorded by Quintella Reichert, MD on 08/01/2016 at 1:22 PM EST Since Dr. [name] manages her CKD please forward labs to him and have patient call his office to let them know her face is swelling ------  Notes Recorded by Henrietta Dine, RN on 08/01/2016 at 8:32 AM EST Patient states she restarted her Moduretic 2 days ago due to facial swelling. Labs were drawn prior to restarting medication. Patient is followed by Dr. [name]. She anxiously awaits instructions from [name]. ------  Notes Recorded by Quintella Reichert, MD on 07/31/2016 at 4:44 PM EST Please verify if patient is still on diuretic ------  Notes Recorded by Henrietta Dine, RN on 07/31/2016 at 10:03 AM EST For Eliquis dosing.            Eliquis 5 mg BID called in to pharmacy of choice.  Moduretic added back to medication list.

## 2016-08-01 NOTE — Telephone Encounter (Signed)
-----   Message from Quintella Reichert, MD sent at 08/01/2016  1:22 PM EST ----- Since Dr. [name] manages her CKD please forward labs to him and have patient call his office to let them know her face is swelling

## 2016-08-09 ENCOUNTER — Telehealth: Payer: Self-pay | Admitting: Cardiology

## 2016-08-09 NOTE — Telephone Encounter (Signed)
Amanda [name] is calling because her BP has been elevated this past. Her BP today was 160/80. Please call, thanks.

## 2016-08-09 NOTE — Telephone Encounter (Signed)
Left message to call back  

## 2016-08-10 NOTE — Telephone Encounter (Signed)
Notified the pt that per Dr [name], she recommends that she follow-up with Dr Deterding who is managing her HTN and kidney issues.  Pt states she has an appt with Dr [name] on 2/15, same day as she see's Dr [name] for follow-up.  Pt states she just saw his PA the other day, so she will call their office to request for temporary recommendations, prior to seeing Dr Deterding on 2/15.  Pt verbalized understanding and agrees with this plan.  Pt gracious for all the assistance provided.

## 2016-08-10 NOTE — Telephone Encounter (Signed)
Patient needs to check with Dr. [name] who is managing her HTN and kidney issues

## 2016-08-10 NOTE — Telephone Encounter (Signed)
Please verify if she is still taking moduretic and what dose of atenolol she is taking

## 2016-08-10 NOTE — Telephone Encounter (Signed)
Patient is calling complaining of her BP being elevated for the past few days (BP-160/88 HR-83, BP-160/85 HR-81, BP-161/81 HR-87). She states that her pulse has been regular and that she has not felt any fluttering in her chest. She states that she has been taking her medications as prescribed. She states that she is checking her BP at rest 2 hours after taking her meds. Please advise.

## 2016-08-10 NOTE — Telephone Encounter (Signed)
Patient is taking moduretic a 5-50 mg tablet once daily.  Patient is taking atenolol a 50 mg tablet once daily.

## 2016-08-10 NOTE — Telephone Encounter (Signed)
Follow up      Returning a call to the nurse regarding her high bp.  Please call

## 2016-08-22 ENCOUNTER — Ambulatory Visit
Admission: RE | Admit: 2016-08-22 | Discharge: 2016-08-22 | Disposition: A | Discharge status: Home / Self Care | Payer: Medicare Other | Source: Ambulatory Visit | Attending: Family Medicine | Admitting: Family Medicine

## 2016-08-22 DIAGNOSIS — Z1231 Encounter for screening mammogram for malignant neoplasm of breast: Secondary | ICD-10-CM

## 2016-08-23 ENCOUNTER — Encounter: Payer: Self-pay | Admitting: Cardiology

## 2016-08-23 ENCOUNTER — Telehealth: Payer: Self-pay

## 2016-08-23 ENCOUNTER — Ambulatory Visit (INDEPENDENT_AMBULATORY_CARE_PROVIDER_SITE_OTHER): Payer: Medicare Other | Admitting: Cardiology

## 2016-08-23 VITALS — BP 128/50 | HR 76 | Ht 67.5 in | Wt 137.0 lb

## 2016-08-23 DIAGNOSIS — I341 Nonrheumatic mitral (valve) prolapse: Secondary | ICD-10-CM | POA: Diagnosis not present

## 2016-08-23 DIAGNOSIS — I10 Essential (primary) hypertension: Secondary | ICD-10-CM | POA: Diagnosis not present

## 2016-08-23 DIAGNOSIS — I4430 Unspecified atrioventricular block: Secondary | ICD-10-CM

## 2016-08-23 DIAGNOSIS — I481 Persistent atrial fibrillation: Secondary | ICD-10-CM

## 2016-08-23 DIAGNOSIS — I4819 Other persistent atrial fibrillation: Secondary | ICD-10-CM

## 2016-08-23 NOTE — Progress Notes (Signed)
Cardiology Office Note    Date:  08/23/2016   ID:  Amanda [name], DOB 1941-01-21, MRN [id number]  PCP:  [name], MD  Cardiologist:  Armanda Magic, MD   Chief Complaint  Patient presents with  . Mitral Regurgitation  . Hypertension    History of Present Illness:  Amanda [name] is a 76 y.o. female with a history of PAC's, MVP with moderate MR, Second degree AV block s/p PPM and HTN who presents today for followup. She is doing well. She denies any chest pain, SOB, DOE, dizziness, palpitations or syncope. She denies any LE edema on the diuretic. She was noted to have afib on pacer check in January and is now on Eliquis which she is tolerating well.    Past Medical History:  Diagnosis Date  . Allergic rhinitis   . Anemia   . Chronic kidney disease    stage III  . DJD (degenerative joint disease)   . Glomerulonephritis    Dr [name]  . Hyperlipidemia   . Hyperparathyroidism (HCC)   . Hypertension   . Hypothyroidism   . IBS (irritable bowel syndrome)   . Interstitial cystitis   . Mobitz II    a. s/p STJ dual chamber PPM   . MVP (mitral valve prolapse)    moderate posterior MVP with moderate MR and grade II diasotlic dysfunction  . PAC (premature atrial contraction)   . Persistent atrial fibrillation (HCC)     note on pacer check. CHADS2VASC score is 4 now on Eliquis.  . Small vessel disease, cerebrovascular     Past Surgical History:  Procedure Laterality Date  . ABDOMINAL HYSTERECTOMY    . BREAST BIOPSY Right   . EP IMPLANTABLE DEVICE N/A 01/07/2015   STJ dual chamber pacemaker implanted by Dr [name] for 2:1 heart block  . PERIPHERAL VASCULAR CATHETERIZATION N/A 02/03/2015   Procedure: Upper Extremity Venography;  Surgeon: [name], MD;  Location: [facility] INVASIVE CV LAB;  Service: Cardiovascular;  Laterality: N/A;  . TONSILLECTOMY      Current Medications: Current Meds  Medication Sig  . amiloride-hydrochlorothiazide (MODURETIC) 5-50 MG  tablet Take 1 tablet by mouth daily.  [name] apixaban (ELIQUIS) 5 MG TABS tablet Take 1 tablet (5 mg total) by mouth 2 (two) times daily.  [name] atenolol (TENORMIN) 50 MG tablet Take 50 mg by mouth daily.  . calcitRIOL (ROCALTROL) 0.25 MCG capsule Take 0.25 mcg by mouth every other day.   . conjugated estrogens (PREMARIN) vaginal cream Place 1 Applicatorful vaginally daily as needed (moisture).   . Cyanocobalamin (VITAMIN B-12) 1000 MCG SUBL Place 1,000 mcg under the tongue daily.   . fexofenadine (ALLEGRA) 180 MG tablet Take 180 mg by mouth daily.  . fluticasone (FLONASE) 50 MCG/ACT nasal spray Place 2 sprays into both nostrils daily.  . hydrocortisone valerate ointment (WEST-CORT) 0.2 % Apply 1 application topically 2 (two) times daily as needed (itching).   [name] levothyroxine (SYNTHROID, LEVOTHROID) 75 MCG tablet Take 75 mcg by mouth daily at 6 PM.   . Misc Natural Products (TART CHERRY ADVANCED PO) Take 1,200 mg by mouth daily. Tart Cherry  . naproxen sodium (ANAPROX) 220 MG tablet Take 220 mg by mouth 2 (two) times daily as needed (pain).  . Probiotic Product (PROBIOTIC DAILY PO) Take 1 tablet by mouth daily.    Allergies:   Pepcid [famotidine]; Allopurinol; Amlodipine; Colchicine; Contrast media [iodinated diagnostic agents]; Prilosec [omeprazole]; Atorvastatin; Augmentin [amoxicillin-pot clavulanate]; Cephalexin; Ciprofloxacin; Erythromycin; Sulfonamide derivatives; and Tetracycline  Social History   Social History  . Marital status: Married    Spouse name: N/A  . Number of children: 2  . Years of education: N/A   Occupational History  . retired    Social History Main Topics  . Smoking status: Former Games developer  . Smokeless tobacco: Never Used  . Alcohol use No  . Drug use: No  . Sexual activity: Not on file   Other Topics Concern  . Not on file   Social History Narrative   Patient drinks 1-2 cups of caffeine daily.   Patient is right handed.      Family History:  The patient's  family history includes Brain cancer in her brother; Heart attack in her brother and father; Heart disease in her brother, father, and mother; Hypertension in her brother, mother, and sister; Melanoma in her brother; Ovarian cancer in her sister; Rheum arthritis in her sister; Stroke in her mother.   ROS:   Please see the history of present illness.    ROS All other systems reviewed and are negative.  No flowsheet data found.     PHYSICAL EXAM:   VS:  BP (!) 128/50   Pulse 76   Ht 5' 7.5" (1.715 m)   Wt 137 lb (62.1 kg)   BMI 21.14 kg/m    GEN: Well nourished, well developed, in no acute distress  HEENT: normal  Neck: no JVD, carotid bruits, or masses Cardiac: RRR; no murmurs, rubs, or gallops,no edema.  Intact distal pulses bilaterally.  Respiratory:  clear to auscultation bilaterally, normal work of breathing GI: soft, nontender, nondistended, + BS MS: no deformity or atrophy  Skin: warm and dry, no rash Neuro:  Alert and Oriented x 3, Strength and sensation are intact Psych: euthymic mood, full affect  Wt Readings from Last 3 Encounters:  08/23/16 137 lb (62.1 kg)  03/16/16 139 lb (63 kg)  01/23/16 138 lb (62.6 kg)      Studies/Labs Reviewed:   EKG:  EKG is not ordered today.    Recent Labs: 07/30/2016: BUN 25; Creatinine, Ser 1.33; Potassium 3.6; Sodium 145   Lipid Panel    Component Value Date/Time   CHOL 153 01/07/2015 0304   TRIG 67 01/07/2015 0304   HDL 51 01/07/2015 0304   CHOLHDL 3.0 01/07/2015 0304   VLDL 13 01/07/2015 0304   LDLCALC 89 01/07/2015 0304    Additional studies/ records that were reviewed today include:  none    ASSESSMENT:    1. MVP (mitral valve prolapse)   2. Essential hypertension   3. AV block   4. Persistent atrial fibrillation (HCC)      PLAN:  In order of problems listed above:  1. MVP with moderate to severe MR.  Her LVF remains preserved and LV dimensions are normal.  I have encouraged her to let us know if she  gets develops SOB, LE edema, PND or orhtopnea.  I will repeat echo in 6 months. 2. HTN - BP well controlled on current meds.  She will continue on moduretic and atenolol. 3. Complete AV block s/p PPM followed in device clinic 4. Persistent atrial fibrillation noted on pacer check.  Her CHADS2VASC score is 4 and she is on Eliquis.  She is in NSR by exam today.  Recent BMET is stable.     Medication Adjustments/Labs and Tests Ordered: Current medicines are reviewed at length with the patient today.  Concerns regarding medicines are outlined above.  Medication changes, Labs and  Tests ordered today are listed in the Patient Instructions below.  Patient Instructions  Medication Instructions:  Your physician recommends that you continue on your current medications as directed. Please refer to the Current Medication list given to you today.   Labwork: None  Testing/Procedures: Your physician has requested that you have an echocardiogram in June, 2018. Echocardiography is a painless test that uses sound waves to create images of your heart. It provides your doctor with information about the size and shape of your heart and how well your heart's chambers and valves are working. This procedure takes approximately one hour. There are no restrictions for this procedure.  Follow-Up: Your physician wants you to follow-up in: 6 months with [name]. You will receive a reminder letter in the mail two months in advance. If you don't receive a letter, please call our office to schedule the follow-up appointment.   Any Other Special Instructions Will Be Listed Below (If Applicable).     If you need a refill on your cardiac medications before your next appointment, please call your pharmacy.      Signed, Armanda Magic, MD  08/23/2016 1:27 PM    [facility] [address] [place], [place], [place]  [address] Phone: [phone]; Fax: [phone]

## 2016-08-23 NOTE — Patient Instructions (Signed)
Medication Instructions:  Your physician recommends that you continue on your current medications as directed. Please refer to the Current Medication list given to you today.   Labwork: None  Testing/Procedures: Your physician has requested that you have an echocardiogram in June, 2018. Echocardiography is a painless test that uses sound waves to create images of your heart. It provides your doctor with information about the size and shape of your heart and how well your heart's chambers and valves are working. This procedure takes approximately one hour. There are no restrictions for this procedure.  Follow-Up: Your physician wants you to follow-up in: 6 months with [name]. You will receive a reminder letter in the mail two months in advance. If you don't receive a letter, please call our office to schedule the follow-up appointment.   Any Other Special Instructions Will Be Listed Below (If Applicable).     If you need a refill on your cardiac medications before your next appointment, please call your pharmacy.

## 2016-08-23 NOTE — Telephone Encounter (Signed)
Patient will come today at 1145. She was grateful for call.

## 2016-08-23 NOTE — Telephone Encounter (Signed)
Per [name], left message to call back to change appointment to TODAY at 1145 or TODAY at 1245.

## 2016-09-17 ENCOUNTER — Ambulatory Visit: Payer: Medicare Other | Admitting: Cardiology

## 2016-10-18 ENCOUNTER — Telehealth: Payer: Self-pay | Admitting: Cardiology

## 2016-10-18 NOTE — Telephone Encounter (Signed)
New message      Pt c/o medication issue:  1. Name of Medication:  eliquis 2. How are you currently taking this medication (dosage and times per day)?  5mg  bid 3. Are you having a reaction (difficulty breathing--STAT)?  no  4. What is your medication issue?  Pt states that she is having nosebleeds.  Please advise

## 2016-10-18 NOTE — Telephone Encounter (Signed)
Patient called to report nose bleeds. She first had one weeks ago. Yesterday, she worked in the yard and did not use a mask. She went inside to urinate, sat on the toilet and her nose started "pouring."  It stopped after 15-20 minutes. The same thing happened this morning and resolved in 15-20 minutes. She is concerned the bleeding is caused by her Eliquis. Informed the patient that Eliquis is not stopped until it is known it is the culprit of the bleeding. She reports her allergies have been "especially awful" this year and her medications have been changed numerous times to treat them. Instructed her to see her PCP for nose bleeding and allergies and to call if PCP decides the bleeding is indeed from Eliquis. She was grateful for call and agrees with treatment plan.

## 2016-10-22 ENCOUNTER — Ambulatory Visit (INDEPENDENT_AMBULATORY_CARE_PROVIDER_SITE_OTHER): Payer: Medicare Other | Admitting: *Deleted

## 2016-10-22 DIAGNOSIS — I441 Atrioventricular block, second degree: Secondary | ICD-10-CM | POA: Diagnosis not present

## 2016-10-22 NOTE — Progress Notes (Signed)
Remote pacemaker transmission.   

## 2016-10-23 LAB — CUP PACEART REMOTE DEVICE CHECK
Battery Voltage: 3.01 V
Brady Statistic AP VP Percent: 39 %
Brady Statistic AS VP Percent: 60 %
Brady Statistic RA Percent Paced: 38 %
Date Time Interrogation Session: 20180416063644
Implantable Lead Implant Date: 20160701
Implantable Lead Location: 753859
Implantable Pulse Generator Implant Date: 20160701
Lead Channel Impedance Value: 550 Ohm
Lead Channel Pacing Threshold Amplitude: 0.75 V
Lead Channel Pacing Threshold Pulse Width: 0.4 ms
Lead Channel Sensing Intrinsic Amplitude: 12 mV
Lead Channel Setting Pacing Amplitude: 1.25 V
Lead Channel Setting Pacing Amplitude: 1.75 V
Lead Channel Setting Sensing Sensitivity: 2 mV
MDC IDC LEAD IMPLANT DT: 20160701
MDC IDC LEAD LOCATION: 753860
MDC IDC MSMT BATTERY REMAINING LONGEVITY: 128 mo
MDC IDC MSMT BATTERY REMAINING PERCENTAGE: 95.5 %
MDC IDC MSMT LEADCHNL RA IMPEDANCE VALUE: 460 Ohm
MDC IDC MSMT LEADCHNL RA SENSING INTR AMPL: 2.4 mV
MDC IDC MSMT LEADCHNL RV PACING THRESHOLD AMPLITUDE: 1 V
MDC IDC MSMT LEADCHNL RV PACING THRESHOLD PULSEWIDTH: 0.4 ms
MDC IDC SET LEADCHNL RV PACING PULSEWIDTH: 0.4 ms
MDC IDC STAT BRADY AP VS PERCENT: 1 %
MDC IDC STAT BRADY AS VS PERCENT: 1 %
MDC IDC STAT BRADY RV PERCENT PACED: 99 %
Pulse Gen Model: 2240
Pulse Gen Serial Number: 7785935

## 2016-10-24 ENCOUNTER — Encounter: Payer: Self-pay | Admitting: Cardiology

## 2016-10-24 ENCOUNTER — Telehealth (HOSPITAL_COMMUNITY): Payer: Self-pay | Admitting: Cardiology

## 2016-10-26 NOTE — Telephone Encounter (Signed)
  10/24/2016 10:19 AM Phone (Outgoing) Kyong, Desmidt A (Self) [phone] (H)   Left Message - Called pt back to r/s echo due to her being out of town.    By Elita Boone

## 2016-12-21 ENCOUNTER — Other Ambulatory Visit (HOSPITAL_COMMUNITY): Payer: Medicare Other

## 2016-12-27 ENCOUNTER — Ambulatory Visit (HOSPITAL_COMMUNITY): Payer: Medicare Other | Attending: Cardiology

## 2016-12-27 ENCOUNTER — Other Ambulatory Visit: Payer: Self-pay

## 2016-12-27 DIAGNOSIS — I341 Nonrheumatic mitral (valve) prolapse: Secondary | ICD-10-CM | POA: Insufficient documentation

## 2016-12-27 DIAGNOSIS — I371 Nonrheumatic pulmonary valve insufficiency: Secondary | ICD-10-CM | POA: Insufficient documentation

## 2016-12-27 DIAGNOSIS — I10 Essential (primary) hypertension: Secondary | ICD-10-CM | POA: Insufficient documentation

## 2016-12-27 DIAGNOSIS — I340 Nonrheumatic mitral (valve) insufficiency: Secondary | ICD-10-CM | POA: Diagnosis not present

## 2016-12-27 DIAGNOSIS — I4510 Unspecified right bundle-branch block: Secondary | ICD-10-CM | POA: Insufficient documentation

## 2016-12-27 DIAGNOSIS — Z87891 Personal history of nicotine dependence: Secondary | ICD-10-CM | POA: Insufficient documentation

## 2016-12-27 DIAGNOSIS — I4430 Unspecified atrioventricular block: Secondary | ICD-10-CM | POA: Insufficient documentation

## 2017-01-10 ENCOUNTER — Encounter: Payer: Self-pay | Admitting: Cardiology

## 2017-01-10 NOTE — Telephone Encounter (Signed)
Patient returning call, thanks. °

## 2017-01-10 NOTE — Telephone Encounter (Signed)
This encounter was created in error - please disregard.

## 2017-01-11 ENCOUNTER — Other Ambulatory Visit: Payer: Self-pay

## 2017-01-11 ENCOUNTER — Telehealth: Payer: Self-pay

## 2017-01-11 DIAGNOSIS — R1310 Dysphagia, unspecified: Secondary | ICD-10-CM

## 2017-01-11 NOTE — Progress Notes (Signed)
Amanda Reichert, MD  Henrietta Dine, RN Cc: [name], MD        Please order a barium swallow    Test ordered and scheduled 7/11 at 0900. Patient understands to arrive at the [facility] Entrance at [phone]. She understands to be NPO for 3 hours prior to the test. She understands appropriate follow-up (either scheduling TEE or further testing with Dr. Adela Lank) will be arranged after test. Patient agrees with treatment plan.

## 2017-01-11 NOTE — Telephone Encounter (Signed)
Henrietta Dine, RN at 01/11/2017 3:38 PM   Status: Signed        Quintella Reichert, MD  Henrietta Dine, RN Cc: [name], MD         Please order a barium swallow     Test ordered and scheduled 7/11 at 0900. Patient understands to arrive at the [facility] Entrance at [phone]. She understands to be NPO for 3 hours prior to the test. She understands appropriate follow-up (either scheduling TEE or further testing with Dr. Adela Lank) will be arranged after test. Patient agrees with treatment plan.

## 2017-01-16 ENCOUNTER — Ambulatory Visit (HOSPITAL_COMMUNITY)
Admission: RE | Admit: 2017-01-16 | Discharge: 2017-01-16 | Disposition: A | Discharge status: Home / Self Care | Payer: Medicare Other | Source: Ambulatory Visit | Attending: Cardiology | Admitting: Cardiology

## 2017-01-16 DIAGNOSIS — R1310 Dysphagia, unspecified: Secondary | ICD-10-CM | POA: Insufficient documentation

## 2017-01-16 DIAGNOSIS — K449 Diaphragmatic hernia without obstruction or gangrene: Secondary | ICD-10-CM | POA: Diagnosis not present

## 2017-01-16 DIAGNOSIS — K224 Dyskinesia of esophagus: Secondary | ICD-10-CM | POA: Diagnosis not present

## 2017-01-16 DIAGNOSIS — K219 Gastro-esophageal reflux disease without esophagitis: Secondary | ICD-10-CM | POA: Diagnosis not present

## 2017-01-18 ENCOUNTER — Other Ambulatory Visit: Payer: Self-pay

## 2017-01-18 DIAGNOSIS — R1310 Dysphagia, unspecified: Secondary | ICD-10-CM

## 2017-01-21 ENCOUNTER — Ambulatory Visit (INDEPENDENT_AMBULATORY_CARE_PROVIDER_SITE_OTHER): Payer: Medicare Other | Admitting: *Deleted

## 2017-01-21 DIAGNOSIS — I441 Atrioventricular block, second degree: Secondary | ICD-10-CM

## 2017-01-21 NOTE — Progress Notes (Signed)
Remote pacemaker transmission.   

## 2017-01-22 ENCOUNTER — Encounter: Payer: Self-pay | Admitting: Internal Medicine

## 2017-01-22 ENCOUNTER — Ambulatory Visit (INDEPENDENT_AMBULATORY_CARE_PROVIDER_SITE_OTHER): Payer: Medicare Other | Admitting: Internal Medicine

## 2017-01-22 VITALS — BP 120/60 | HR 76 | Ht 66.5 in | Wt 140.8 lb

## 2017-01-22 DIAGNOSIS — Z950 Presence of cardiac pacemaker: Secondary | ICD-10-CM | POA: Diagnosis not present

## 2017-01-22 DIAGNOSIS — I4430 Unspecified atrioventricular block: Secondary | ICD-10-CM

## 2017-01-22 DIAGNOSIS — I441 Atrioventricular block, second degree: Secondary | ICD-10-CM

## 2017-01-22 NOTE — Patient Instructions (Signed)

## 2017-01-22 NOTE — Progress Notes (Signed)
HPI [name] returns today for followup. She is a pleasant 76 yo woman with symptomatic 2:1 AV block, and mitral regurge who underwent PPM insertion 2 years ago. Her left arm swelling has resolved. She has been stable since and notes that her dyspnea is improved. No edema or chest pressure. She is tolerating her anti-coagulation. Allergies  Allergen Reactions  . Pepcid [Famotidine] Swelling    "Throat Swelling"   . Allopurinol Other (See Comments)    Caused headaches  . Amlodipine Other (See Comments)    PEDAL EDEMA   . Colchicine Other (See Comments)    unknown  . Contrast Media [Iodinated Diagnostic Agents] Hives    IVP dye per patient  . Prilosec [Omeprazole] Nausea Only  . Atorvastatin     unknown  . Augmentin [Amoxicillin-Pot Clavulanate] Itching and Rash  . Cephalexin Itching and Rash  . Ciprofloxacin Itching and Rash  . Erythromycin Itching and Rash  . Sulfonamide Derivatives Itching and Rash  . Tetracycline Itching and Rash     Current Outpatient Prescriptions  Medication Sig Dispense Refill  . amiloride-hydrochlorothiazide (MODURETIC) 5-50 MG tablet Take 1 tablet by mouth daily.    [name] apixaban (ELIQUIS) 5 MG TABS tablet Take 1 tablet (5 mg total) by mouth 2 (two) times daily. 60 tablet 11  . atenolol (TENORMIN) 50 MG tablet Take 50 mg by mouth daily.    [name] conjugated estrogens (PREMARIN) vaginal cream Place 1 Applicatorful vaginally daily as needed (moisture).     . fluticasone (FLONASE) 50 MCG/ACT nasal spray Place 2 sprays into both nostrils daily.    . hydrocortisone valerate ointment (WEST-CORT) 0.2 % Apply 1 application topically 2 (two) times daily as needed (itching).     [name] levocetirizine (XYZAL) 5 MG tablet Take 5 mg by mouth every evening.    [name] levothyroxine (SYNTHROID, LEVOTHROID) 75 MCG tablet Take 75 mcg by mouth daily at 6 PM.     . naproxen sodium (ANAPROX) 220 MG tablet Take 220 mg by mouth 2 (two) times daily as needed (pain).    . NON  FORMULARY as directed. Allergy injections    . Probiotic Product (PROBIOTIC DAILY PO) Take 1 tablet by mouth daily.     No current facility-administered medications for this visit.      Past Medical History:  Diagnosis Date  . Allergic rhinitis   . Anemia   . Chronic kidney disease    stage III  . DJD (degenerative joint disease)   . Glomerulonephritis    Dr [name]  . Hyperlipidemia   . Hyperparathyroidism (HCC)   . Hypertension   . Hypothyroidism   . IBS (irritable bowel syndrome)   . Interstitial cystitis   . Mobitz II    a. s/p STJ dual chamber PPM   . MVP (mitral valve prolapse)    moderate posterior MVP with moderate MR and grade II diasotlic dysfunction  . PAC (premature atrial contraction)   . Persistent atrial fibrillation (HCC)     note on pacer check. CHADS2VASC score is 4 now on Eliquis.  . Small vessel disease, cerebrovascular     ROS:   All systems reviewed and negative except as noted in the HPI.   Past Surgical History:  Procedure Laterality Date  . ABDOMINAL HYSTERECTOMY    . BREAST BIOPSY Right   . EP IMPLANTABLE DEVICE N/A 01/07/2015   STJ dual chamber pacemaker implanted by Dr [name] for 2:1 heart block  . PERIPHERAL VASCULAR CATHETERIZATION N/A  02/03/2015   Procedure: Upper Extremity Venography;  Surgeon: [name], MD;  Location: [facility] INVASIVE CV LAB;  Service: Cardiovascular;  Laterality: N/A;  . TONSILLECTOMY       Family History  Problem Relation Age of Onset  . Heart disease Mother   . Stroke Mother   . Hypertension Mother   . Heart disease Father   . Heart attack Father   . Ovarian cancer Sister   . Heart disease Brother   . Rheum arthritis Sister   . Melanoma Brother   . Brain cancer Brother   . Heart attack Brother   . Hypertension Brother   . Hypertension Sister   . Colon cancer Neg Hx      Social History   Social History  . Marital status: Married    Spouse name: N/A  . Number of children: 2  . Years of  education: N/A   Occupational History  . retired    Social History Main Topics  . Smoking status: Former Games developer  . Smokeless tobacco: Never Used  . Alcohol use No  . Drug use: No  . Sexual activity: Not on file   Other Topics Concern  . Not on file   Social History Narrative   Patient drinks 1-2 cups of caffeine daily.   Patient is right handed.      BP 120/60   Pulse 76   Ht 5' 6.5" (1.689 m)   Wt 140 lb 12.8 oz (63.9 kg)   SpO2 95%   BMI 22.39 kg/m   Physical Exam:  Well appearing 76 yo woman, NAD HEENT: Unremarkable Neck: 6 cm JVD, no thyromegally Lymphatics:  No adenopathy Back:  No CVA tenderness Lungs:  Clear with no wheezes HEART:  Regular rate rhythm, no murmurs, no rubs, no clicks Abd:  soft, positive bowel sounds, no organomegally, no rebound, no guarding Ext:  2 plus pulses, no edema, no cyanosis, no clubbing Skin:  No rashes no nodules Neuro:  CN II through XII intact, motor grossly intact  EKG - p synchronous ventricular pacing  DEVICE  Normal device function.  See PaceArt for details.   Assess/Plan: 1. Complete heart block - she is doing well, s/p PPM insertion. 2. HTN - her blood pressure is reasonably well controlled. No change in meds. 3. MR - she is pending followup with [name]. She will undergo repeat 2D echo. 4. PAF - she is maintaining NSR over 99% of the time. She will continue her current meds.  [name]

## 2017-01-23 ENCOUNTER — Encounter: Payer: Self-pay | Admitting: Cardiology

## 2017-01-23 LAB — CUP PACEART REMOTE DEVICE CHECK
Battery Remaining Longevity: 127 mo
Battery Voltage: 3.01 V
Brady Statistic AP VP Percent: 40 %
Brady Statistic AS VP Percent: 58 %
Brady Statistic AS VS Percent: 1 %
Date Time Interrogation Session: 20180716060012
Implantable Lead Implant Date: 20160701
Implantable Lead Implant Date: 20160701
Implantable Lead Location: 753859
Lead Channel Impedance Value: 440 Ohm
Lead Channel Impedance Value: 550 Ohm
Lead Channel Pacing Threshold Amplitude: 0.75 V
Lead Channel Pacing Threshold Pulse Width: 0.4 ms
Lead Channel Pacing Threshold Pulse Width: 0.4 ms
Lead Channel Sensing Intrinsic Amplitude: 3.2 mV
Lead Channel Setting Pacing Amplitude: 1.25 V
MDC IDC LEAD LOCATION: 753860
MDC IDC MSMT BATTERY REMAINING PERCENTAGE: 95.5 %
MDC IDC MSMT LEADCHNL RA SENSING INTR AMPL: 1.7 mV
MDC IDC MSMT LEADCHNL RV PACING THRESHOLD AMPLITUDE: 1 V
MDC IDC PG IMPLANT DT: 20160701
MDC IDC PG SERIAL: 7785935
MDC IDC SET LEADCHNL RA PACING AMPLITUDE: 1.75 V
MDC IDC SET LEADCHNL RV PACING PULSEWIDTH: 0.4 ms
MDC IDC SET LEADCHNL RV SENSING SENSITIVITY: 2 mV
MDC IDC STAT BRADY AP VS PERCENT: 1 %
MDC IDC STAT BRADY RA PERCENT PACED: 39 %
MDC IDC STAT BRADY RV PERCENT PACED: 98 %

## 2017-02-05 ENCOUNTER — Ambulatory Visit (INDEPENDENT_AMBULATORY_CARE_PROVIDER_SITE_OTHER): Payer: Medicare Other | Admitting: Gastroenterology

## 2017-02-05 ENCOUNTER — Encounter: Payer: Self-pay | Admitting: Gastroenterology

## 2017-02-05 VITALS — BP 124/62 | HR 60 | Ht 66.75 in | Wt 141.2 lb

## 2017-02-05 DIAGNOSIS — R1310 Dysphagia, unspecified: Secondary | ICD-10-CM | POA: Diagnosis not present

## 2017-02-05 NOTE — Progress Notes (Signed)
HPI :  76 y/o female with history of heart block s/p pacemaker placement, history of AF on Eliquis, here for a follow up visit for dysphagia.   She has had dysphagia intermittently for a long time. 2 prior EGDs leading to dilation of distal esophageal stenosis, last in 2015, which did provide some benefit to her. She had an echo on 7/5 showing mildly dilated aortic root, moderate MR - cardiology recommending TEE. The patient is concerned about having TEE prior to an EGD given her recurrent dysphagia.  She is having some dysphagia more so recently. She feels it occasionally, not with every meal. No dysphagia to liquids, usually only with meats or bulkier things. She thinks getting worse over time. Loss of appetite - she thinks for several month. She has not had to vomit. No weight loss. No abdominal pains. She is not having any reflux that is bothering her. She feels she has some postnasal drip.  She is taking Eliquis for history of atrial fibrillation.   Barium swallow on 7/11 - narrowing of distal esophagus, with a ring, prohibiting passage of the pill, nonspecific dysmotility  Endoscopic history: EGD 7/15 - normal exam, 16mm Savory done EGD 4/11 - stricture distal esophagus 16mm Savory dilator Colonoscopy 4/11 - normal colon with normal biopsies Colonoscopy 2/06 - normal colon, normal biopsies Colonoscopy 07/00 - normal colon, ? Collagenous colitis, nonspecific chronic inflammation with increased eosinophils    Past Medical History:  Diagnosis Date  . Allergic rhinitis   . Anemia   . Chronic kidney disease    stage III  . DJD (degenerative joint disease)   . Glomerulonephritis    Dr [name]  . Hyperlipidemia   . Hyperparathyroidism (HCC)   . Hypertension   . Hypothyroidism   . IBS (irritable bowel syndrome)   . Interstitial cystitis   . Mobitz II    a. s/p STJ dual chamber PPM   . MVP (mitral valve prolapse)    moderate posterior MVP with moderate MR and grade II diasotlic  dysfunction  . PAC (premature atrial contraction)   . Persistent atrial fibrillation (HCC)     note on pacer check. CHADS2VASC score is 4 now on Eliquis.  . Small vessel disease, cerebrovascular      Past Surgical History:  Procedure Laterality Date  . ABDOMINAL HYSTERECTOMY    . BREAST BIOPSY Right   . EP IMPLANTABLE DEVICE N/A 01/07/2015   STJ dual chamber pacemaker implanted by Dr [name] for 2:1 heart block  . PERIPHERAL VASCULAR CATHETERIZATION N/A 02/03/2015   Procedure: Upper Extremity Venography;  Surgeon: [name], MD;  Location: [facility] INVASIVE CV LAB;  Service: Cardiovascular;  Laterality: N/A;  . TONSILLECTOMY     Family History  Problem Relation Age of Onset  . Heart disease Mother   . Stroke Mother   . Hypertension Mother   . Heart disease Father   . Heart attack Father   . Ovarian cancer Sister   . Heart disease Brother   . Rheum arthritis Sister   . Melanoma Brother   . Brain cancer Brother   . Heart attack Brother   . Hypertension Brother   . Hypertension Sister   . Colon cancer Neg Hx    Social History  Substance Use Topics  . Smoking status: Former Games developer  . Smokeless tobacco: Never Used  . Alcohol use No   Current Outpatient Prescriptions  Medication Sig Dispense Refill  . amiloride-hydrochlorothiazide (MODURETIC) 5-50 MG tablet Take 1 tablet  by mouth daily.    [name] apixaban (ELIQUIS) 5 MG TABS tablet Take 1 tablet (5 mg total) by mouth 2 (two) times daily. 60 tablet 11  . atenolol (TENORMIN) 50 MG tablet Take 50 mg by mouth daily.    [name] conjugated estrogens (PREMARIN) vaginal cream Place 1 Applicatorful vaginally daily as needed (moisture).     . fluticasone (FLONASE) 50 MCG/ACT nasal spray Place 2 sprays into both nostrils daily.    . hydrocortisone valerate ointment (WEST-CORT) 0.2 % Apply 1 application topically 2 (two) times daily as needed (itching).     [name] levocetirizine (XYZAL) 5 MG tablet Take 5 mg by mouth every evening.    [name] levothyroxine  (SYNTHROID, LEVOTHROID) 75 MCG tablet Take 75 mcg by mouth daily at 6 PM.     . naproxen sodium (ANAPROX) 220 MG tablet Take 220 mg by mouth 2 (two) times daily as needed (pain).    . NON FORMULARY as directed. Allergy injections    . Probiotic Product (PROBIOTIC DAILY PO) Take 1 tablet by mouth daily.     No current facility-administered medications for this visit.    Allergies  Allergen Reactions  . Pepcid [Famotidine] Swelling    "Throat Swelling"   . Allopurinol Other (See Comments)    Caused headaches  . Amlodipine Other (See Comments)    PEDAL EDEMA   . Colchicine Other (See Comments)    unknown  . Contrast Media [Iodinated Diagnostic Agents] Hives    IVP dye per patient  . Prilosec [Omeprazole] Nausea Only  . Atorvastatin     unknown  . Augmentin [Amoxicillin-Pot Clavulanate] Itching and Rash  . Cephalexin Itching and Rash  . Ciprofloxacin Itching and Rash  . Erythromycin Itching and Rash  . Sulfonamide Derivatives Itching and Rash  . Tetracycline Itching and Rash     Review of Systems: All systems reviewed and negative except where noted in HPI.    Dg Esophagus  Result Date: 01/16/2017 CLINICAL DATA:  Difficulty swallowing. History of prior esophageal dilatation. EXAM: ESOPHOGRAM/BARIUM SWALLOW TECHNIQUE: Combined double contrast and single contrast examination performed using effervescent crystals, thick barium liquid, and thin barium liquid. FLUOROSCOPY TIME:  Fluoroscopy Time:  3 minutes and 6 seconds Radiation Exposure Index (if provided by the fluoroscopic device): 17 mGy Number of Acquired Spot Images: 0 COMPARISON:  None. FINDINGS: Initial barium swallows demonstrate normal pharyngeal motion with swallowing. No laryngeal penetration or aspiration. No upper esophageal webs, strictures or diverticuli. Nonspecific esophageal dysmotility with occasional disruption of the primary peristaltic wave and occasional tertiary contractions with mild esophageal stasis. No  intrinsic or extrinsic lesions are identified. Smooth narrowing of the distal esophagus and a lower esophageal mucosal ring. Very small sliding-type hiatal hernia. GE reflux was demonstrated with water swallowing. The 13 mm barium pill would not pass into the stomach. IMPRESSION: 1. Nonspecific esophageal dysmotility. 2. Smooth narrowing of distal esophagus and a lower esophageal mucosal ring. The 13 mm barium pill would not pass into the stomach. 3. Very small sliding-type hiatal hernia and inducible GE reflux. Electronically Signed   By: Rudie Meyer M.D.   On: 01/16/2017 09:34    Physical Exam: BP 124/62 (BP Location: Left Arm, Patient Position: Sitting, Cuff Size: Normal)   Pulse 60   Ht 5' 6.75" (1.695 m)   Wt 141 lb 4 oz (64.1 kg)   BMI 22.29 kg/m  Constitutional: Pleasant,well-developed, female in no acute distress. HEENT: Normocephalic and atraumatic. Conjunctivae are normal. No scleral icterus. Neck supple.  Cardiovascular: Normal rate, regular rhythm.  Pulmonary/chest: Effort normal and breath sounds normal. No wheezing, rales or rhonchi. Abdominal: Soft, nondistended, nontender.  There are no masses palpable. No hepatomegaly. Extremities: no edema Lymphadenopathy: No cervical adenopathy noted. Neurological: Alert and oriented to person place and time. Skin: Skin is warm and dry. No rashes noted. Psychiatric: Normal mood and affect. Behavior is normal.   ASSESSMENT AND PLAN: 76 y/o female with history of atrial fibrillation on Eliquis, pacemaker in place, with recurrent dysphagia. Barium swallow shows distal esophageal stricture. She has had this dilated in the past with benefit. She has a recommendation to have a TEE per cardiology - they are requesting EGD prior to TEE.  I discussed risks / benefits of EGD and dilation, anesthesia, with her. I think she will have good symptomatic benefit of with EGD given her response in the past and barium swallow findings. Recommend she hold  Eliquis 3 days prior to the procedure to minimize risk of bleeding. She is scheduled for 8/14. Recommend she chew food well, especially meat, in the interim to prevent impaction. Further recommendations pending the results. She agreed.   Ileene Patrick, MD [facility] Gastroenterology Pager [phone]

## 2017-02-05 NOTE — Patient Instructions (Signed)
If you are age 78 or older, your body mass index should be between 23-30. Your Body mass index is 22.29 kg/m. If this is out of the aforementioned range listed, please consider follow up with your Primary Care Provider.  If you are age 90 or younger, your body mass index should be between 19-25. Your Body mass index is 22.29 kg/m. If this is out of the aformentioned range listed, please consider follow up with your Primary Care Provider.   You have been scheduled for an endoscopy. Please follow written instructions given to you at your visit today. If you use inhalers (even only as needed), please bring them with you on the day of your procedure. Your physician has requested that you go to www.startemmi.com and enter the access code given to you at your visit today. This web site gives a general overview about your procedure. However, you should still follow specific instructions given to you by our office regarding your preparation for the procedure.  Thank you.

## 2017-02-06 ENCOUNTER — Encounter: Payer: Self-pay | Admitting: Gastroenterology

## 2017-02-19 ENCOUNTER — Encounter: Payer: Self-pay | Admitting: Gastroenterology

## 2017-02-19 ENCOUNTER — Ambulatory Visit (AMBULATORY_SURGERY_CENTER): Payer: Medicare Other | Admitting: Gastroenterology

## 2017-02-19 VITALS — BP 125/54 | HR 64 | Temp 97.1°F | Resp 15 | Ht 66.0 in | Wt 141.0 lb

## 2017-02-19 DIAGNOSIS — R1310 Dysphagia, unspecified: Secondary | ICD-10-CM

## 2017-02-19 DIAGNOSIS — K222 Esophageal obstruction: Secondary | ICD-10-CM | POA: Diagnosis not present

## 2017-02-19 MED ORDER — SODIUM CHLORIDE 0.9 % IV SOLN: 500.0000 mL | INTRAVENOUS | Status: DC

## 2017-02-19 NOTE — Op Note (Signed)
[facility] Patient Name: Amanda [name] Procedure Date: 02/19/2017 9:23 AM MRN: [id number] Endoscopist: Viviann Spare P. Laretha Luepke MD, MD Age: 76 Referring MD:  Date of Birth: [birth year] Gender: Female Account #: [id number] Procedure:                Upper GI endoscopy Indications:              Dysphagia. Abnormal barium swallow with distal                            esophageal stenosis Medicines:                Monitored Anesthesia Care Procedure:                Pre-Anesthesia Assessment:                           - Prior to the procedure, a History and Physical                            was performed, and patient medications and                            allergies were reviewed. The patient's tolerance of                            previous anesthesia was also reviewed. The risks                            and benefits of the procedure and the sedation                            options and risks were discussed with the patient.                            All questions were answered, and informed consent                            was obtained. Prior Anticoagulants: The patient has                            taken Eliquis (apixaban), last dose was 3 days                            prior to procedure. ASA Grade Assessment: III - A                            patient with severe systemic disease. After                            reviewing the risks and benefits, the patient was                            deemed in satisfactory condition to undergo the  procedure.                           After obtaining informed consent, the endoscope was                            passed under direct vision. Throughout the                            procedure, the patient's blood pressure, pulse, and                            oxygen saturations were monitored continuously. The                            Endoscope was introduced through the mouth, and               advanced to the second part of duodenum. The upper                            GI endoscopy was accomplished without difficulty.                            The patient tolerated the procedure well. Scope In: Scope Out: Findings:                 Esophagogastric landmarks were identified: the                            Z-line was found at 36 cm, the gastroesophageal                            junction was found at 36 cm and the upper extent of                            the gastric folds was found at 38 cm from the                            incisors.                           A 2 cm hiatal hernia was present.                           One moderate benign-appearing, intrinsic stenosis                            was found 36 cm from the incisors. This measured                            less than one cm (in length). A TTS dilator was                            passed through the scope. Dilation with a 16-17-18  mm balloon dilator was performed at 37mm, 101mm, and                            then 18 mm. Two appropriate mucosal wrents were                            noted post-dilation.                           The exam of the esophagus was otherwise normal.                           The exam of the stomach was otherwise normal.                           There was an angulated turn into the duodenal                            sweep. The remainder of the duodenal bulb and                            second portion of the duodenum were normal. Complications:            No immediate complications. Estimated blood loss:                            Minimal. Estimated Blood Loss:     Estimated blood loss was minimal. Impression:               - Esophagogastric landmarks identified.                           - 2 cm hiatal hernia.                           - Benign-appearing esophageal stenosis. Dilated to                            29mm with good result.                            - Angulated turn into the duodenal sweep. Otherwise                            normal duodenum.                           - No specimens collected. Recommendation:           - Patient has a contact number available for                            emergencies. The signs and symptoms of potential                            delayed complications were discussed with the  patient. Return to normal activities tomorrow.                            Written discharge instructions were provided to the                            patient.                           - Post-dilation diet                           - Continue present medications.                           - Resume Eliquis in 2 days (Thursday morning)                           - Await course following dilation, call if symptoms                            recur / persist Ijeoma Loor P. Aimee Timmons MD, MD 02/19/2017 9:41:57 AM This report has been signed electronically.

## 2017-02-19 NOTE — Progress Notes (Signed)
  FollowPt's states no medical or surgical changes since previsit or office visit.

## 2017-02-19 NOTE — Patient Instructions (Signed)
**  Handout given on Post Dilation Diet**  YOU HAD AN ENDOSCOPIC PROCEDURE TODAY: Refer to the procedure report and other information in the discharge instructions given to you for any specific questions about what was found during the examination. If this information does not answer your questions, please call [place] office at [phone] to clarify.   YOU SHOULD EXPECT: Some feelings of bloating in the abdomen. Passage of more gas than usual. Walking can help get rid of the air that was put into your GI tract during the procedure and reduce the bloating. If you had a lower endoscopy (such as a colonoscopy or flexible sigmoidoscopy) you may notice spotting of blood in your stool or on the toilet paper. Some abdominal soreness may be present for a day or two, also.  DIET: Your first meal following the procedure should be a light meal and then it is ok to progress to your normal diet. A half-sandwich or bowl of soup is an example of a good first meal. Heavy or fried foods are harder to digest and may make you feel nauseous or bloated. Drink plenty of fluids but you should avoid alcoholic beverages for 24 hours. If you had a esophageal dilation, please see attached instructions for diet.    ACTIVITY: Your care partner should take you home directly after the procedure. You should plan to take it easy, moving slowly for the rest of the day. You can resume normal activity the day after the procedure however YOU SHOULD NOT DRIVE, use power tools, machinery or perform tasks that involve climbing or major physical exertion for 24 hours (because of the sedation medicines used during the test).   SYMPTOMS TO REPORT IMMEDIATELY: A gastroenterologist can be reached at any hour. Please call [phone]  for any of the following symptoms:   Following upper endoscopy (EGD, EUS, ERCP, esophageal dilation) Vomiting of blood or coffee ground material  New, significant abdominal pain  New, significant chest pain or  pain under the shoulder blades  Painful or persistently difficult swallowing  New shortness of breath  Black, tarry-looking or red, bloody stools  FOLLOW UP:  If any biopsies were taken you will be contacted by phone or by letter within the next 1-3 weeks. Call [phone]  if you have not heard about the biopsies in 3 weeks.  Please also call with any specific questions about appointments or follow up tests.  

## 2017-02-19 NOTE — Progress Notes (Signed)
A and O x3. Report to RN. Tolerated MAC anesthesia well.Teeth unchanged after procedure.

## 2017-02-19 NOTE — Progress Notes (Signed)
Called to room to assist during endoscopic procedure.  Patient ID and intended procedure confirmed with present staff. Received instructions for my participation in the procedure from the performing physician.  

## 2017-02-20 ENCOUNTER — Telehealth: Payer: Self-pay | Admitting: *Deleted

## 2017-02-20 NOTE — Telephone Encounter (Signed)
  Follow up Call-  Call back number 02/19/2017  Post procedure Call Back phone  # [phone]  Permission to leave phone message Yes  Some recent data might be hidden     Patient questions:  Do you have a fever, pain , or abdominal swelling? No. Pain Score  0 *  Have you tolerated food without any problems? Yes.    Have you been able to return to your normal activities? Yes.    Do you have any questions about your discharge instructions: Diet   No. Medications  No. Follow up visit  No.  Do you have questions or concerns about your Care? No.  Actions: * If pain score is 4 or above: No action needed, pain <4.

## 2017-03-05 ENCOUNTER — Ambulatory Visit (INDEPENDENT_AMBULATORY_CARE_PROVIDER_SITE_OTHER): Payer: Self-pay | Admitting: *Deleted

## 2017-03-05 ENCOUNTER — Encounter: Payer: Self-pay | Admitting: Cardiology

## 2017-03-05 ENCOUNTER — Ambulatory Visit (INDEPENDENT_AMBULATORY_CARE_PROVIDER_SITE_OTHER): Payer: Medicare Other | Admitting: Cardiology

## 2017-03-05 VITALS — BP 110/58 | HR 85 | Ht 66.0 in | Wt 141.6 lb

## 2017-03-05 DIAGNOSIS — I481 Persistent atrial fibrillation: Secondary | ICD-10-CM | POA: Diagnosis not present

## 2017-03-05 DIAGNOSIS — R0609 Other forms of dyspnea: Secondary | ICD-10-CM | POA: Diagnosis not present

## 2017-03-05 DIAGNOSIS — Z950 Presence of cardiac pacemaker: Secondary | ICD-10-CM

## 2017-03-05 DIAGNOSIS — I10 Essential (primary) hypertension: Secondary | ICD-10-CM | POA: Diagnosis not present

## 2017-03-05 DIAGNOSIS — I4819 Other persistent atrial fibrillation: Secondary | ICD-10-CM

## 2017-03-05 DIAGNOSIS — I4430 Unspecified atrioventricular block: Secondary | ICD-10-CM

## 2017-03-05 DIAGNOSIS — I441 Atrioventricular block, second degree: Secondary | ICD-10-CM

## 2017-03-05 DIAGNOSIS — I341 Nonrheumatic mitral (valve) prolapse: Secondary | ICD-10-CM

## 2017-03-05 LAB — CUP PACEART INCLINIC DEVICE CHECK
Battery Remaining Longevity: 127 mo
Battery Voltage: 3.01 V
Implantable Lead Implant Date: 20160701
Implantable Lead Location: 753860
Implantable Pulse Generator Implant Date: 20160701
Lead Channel Impedance Value: 450 Ohm
Lead Channel Impedance Value: 537.5 Ohm
Lead Channel Pacing Threshold Amplitude: 0.75 V
Lead Channel Pacing Threshold Amplitude: 1 V
Lead Channel Pacing Threshold Pulse Width: 0.4 ms
Lead Channel Pacing Threshold Pulse Width: 0.4 ms
Lead Channel Pacing Threshold Pulse Width: 0.4 ms
Lead Channel Sensing Intrinsic Amplitude: 12 mV
Lead Channel Sensing Intrinsic Amplitude: 3.1 mV
Lead Channel Setting Pacing Amplitude: 1.25 V
Lead Channel Setting Sensing Sensitivity: 2 mV
MDC IDC LEAD IMPLANT DT: 20160701
MDC IDC LEAD LOCATION: 753859
MDC IDC MSMT LEADCHNL RA PACING THRESHOLD AMPLITUDE: 0.75 V
MDC IDC MSMT LEADCHNL RA PACING THRESHOLD PULSEWIDTH: 0.4 ms
MDC IDC MSMT LEADCHNL RV PACING THRESHOLD AMPLITUDE: 1 V
MDC IDC PG SERIAL: 7785935
MDC IDC SESS DTM: 20180828094751
MDC IDC SET LEADCHNL RA PACING AMPLITUDE: 1.625
MDC IDC SET LEADCHNL RV PACING PULSEWIDTH: 0.4 ms
MDC IDC STAT BRADY RA PERCENT PACED: 40 %
MDC IDC STAT BRADY RV PERCENT PACED: 97 %

## 2017-03-05 LAB — BASIC METABOLIC PANEL
BUN/Creatinine Ratio: 23 (ref 12–28)
BUN: 31 mg/dL — AB (ref 8–27)
CALCIUM: 10.4 mg/dL — AB (ref 8.7–10.3)
CO2: 26 mmol/L (ref 20–29)
Chloride: 95 mmol/L — ABNORMAL LOW (ref 96–106)
Creatinine, Ser: 1.36 mg/dL — ABNORMAL HIGH (ref 0.57–1.00)
GFR, EST AFRICAN AMERICAN: 43 mL/min/{1.73_m2} — AB (ref >59)
GFR, EST NON AFRICAN AMERICAN: 38 mL/min/{1.73_m2} — AB (ref >59)
Glucose: 86 mg/dL (ref 65–99)
POTASSIUM: 4.2 mmol/L (ref 3.5–5.2)
Sodium: 139 mmol/L (ref 134–144)

## 2017-03-05 LAB — CBC WITH DIFFERENTIAL/PLATELET
BASOS: 1 %
Basophils Absolute: 0 10*3/uL (ref 0.0–0.2)
EOS (ABSOLUTE): 0.3 10*3/uL (ref 0.0–0.4)
EOS: 4 %
HEMATOCRIT: 41.5 % (ref 34.0–46.6)
HEMOGLOBIN: 14.3 g/dL (ref 11.1–15.9)
IMMATURE GRANS (ABS): 0 10*3/uL (ref 0.0–0.1)
IMMATURE GRANULOCYTES: 0 %
LYMPHS: 22 %
Lymphocytes Absolute: 1.9 10*3/uL (ref 0.7–3.1)
MCH: 31 pg (ref 26.6–33.0)
MCHC: 34.5 g/dL (ref 31.5–35.7)
MCV: 90 fL (ref 79–97)
Monocytes Absolute: 0.7 10*3/uL (ref 0.1–0.9)
Monocytes: 8 %
NEUTROS PCT: 65 %
Neutrophils Absolute: 5.7 10*3/uL (ref 1.4–7.0)
Platelets: 267 10*3/uL (ref 150–379)
RBC: 4.62 x10E6/uL (ref 3.77–5.28)
RDW: 13.4 % (ref 12.3–15.4)
WBC: 8.8 10*3/uL (ref 3.4–10.8)

## 2017-03-05 LAB — PRO B NATRIURETIC PEPTIDE: NT-Pro BNP: 2335 pg/mL — ABNORMAL HIGH (ref 0–738)

## 2017-03-05 LAB — APTT: APTT: 33 s (ref 24–33)

## 2017-03-05 LAB — PROTIME-INR
INR: 1.1 (ref 0.8–1.2)
Prothrombin Time: 11.2 s (ref 9.1–12.0)

## 2017-03-05 NOTE — Patient Instructions (Signed)
Medication Instructions:  Your provider recommends that you continue on your current medications as directed. Please refer to the Current Medication list given to you today.    Labwork: TODAY: BMET, BNP  Testing/Procedures: Your physician has requested that you have a TEE. During a TEE, sound waves are used to create images of your heart. It provides your doctor with information about the size and shape of your heart and how well your heart's chambers and valves are working. In this test, a transducer is attached to the end of a flexible tube that's guided down your throat and into your esophagus (the tube leading from you mouth to your stomach) to get a more detailed image of your heart. You are not awake for the procedure. Please see the instruction sheet given to you today. For further information please visit [email or web address] We will call you to schedule this procedure when we hear back from Dr. Adela Lank that it is OK.  Follow-Up: Your provider wants you to follow-up in: 6 months with [name]. You will receive a reminder letter in the mail two months in advance. If you don't receive a letter, please call our office to schedule the follow-up appointment.    Any Other Special Instructions Will Be Listed Below (If Applicable).     If you need a refill on your cardiac medications before your next appointment, please call your pharmacy.

## 2017-03-05 NOTE — Progress Notes (Signed)
Cardiology Office Note:    Date:  03/05/2017   ID:  Amanda [name], DOB July 23, 1940, MRN [id number]  PCP:  [name], MD  Cardiologist:  Armanda Magic, MD   Referring MD: [name], MD   Chief Complaint  Patient presents with  . Follow-up    MVP with MR, AV block s/ PPM and HTN    History of Present Illness:    Amanda [name] is a 76 y.o. female with a hx of with a history of PAC's, MVP with moderate MR, Second degree AV block s/p PPM, PAF on pacer check now on NOAC for CHADs2VASC score of 4 and HTN who presents today for followup. She had a TEE done but the probe could not be passed.  She was referred to GI and a barium swallow was done which showed nonspecific esophageal dysmotility with a smooth narrowing of the lower esophageal mucosal ring and 75mm barium pill would not pass into stomach.  She has been seen back by GI and underwent esophageal dilatation.   She is back today and is now having problems with significant DOE. She says that her chest feels heavy when she gets SOB and has to take deep breaths in.  She denies any PND, orthopnea, palpitationsor syncope. Her LE edema is well controlled on diuretics.  She has also noted that she is feeling much more fatigued since her dilatation but has been more sedentary than usual.    Past Medical History:  Diagnosis Date  . Allergic rhinitis   . Anemia   . Chronic kidney disease    stage III  . DJD (degenerative joint disease)   . Glomerulonephritis    Dr [name]  . Hyperlipidemia   . Hyperparathyroidism (HCC)   . Hypertension   . Hypothyroidism   . IBS (irritable bowel syndrome)   . Interstitial cystitis   . Mobitz II    a. s/p STJ dual chamber PPM   . MVP (mitral valve prolapse)    moderate posterior MVP with moderate MR and grade II diasotlic dysfunction  . PAC (premature atrial contraction)   . Persistent atrial fibrillation (HCC)     note on pacer check. CHADS2VASC score is 4 now on Eliquis.  .  Small vessel disease, cerebrovascular     Past Surgical History:  Procedure Laterality Date  . ABDOMINAL HYSTERECTOMY    . BREAST BIOPSY Right   . EP IMPLANTABLE DEVICE N/A 01/07/2015   STJ dual chamber pacemaker implanted by Dr [name] for 2:1 heart block  . PERIPHERAL VASCULAR CATHETERIZATION N/A 02/03/2015   Procedure: Upper Extremity Venography;  Surgeon: [name], MD;  Location: [facility] INVASIVE CV LAB;  Service: Cardiovascular;  Laterality: N/A;  . TONSILLECTOMY      Current Medications: Current Meds  Medication Sig  . amiloride-hydrochlorothiazide (MODURETIC) 5-50 MG tablet Take 1 tablet by mouth daily.  [name] apixaban (ELIQUIS) 5 MG TABS tablet Take 1 tablet (5 mg total) by mouth 2 (two) times daily.  [name] atenolol (TENORMIN) 50 MG tablet Take 50 mg by mouth daily.  [name] conjugated estrogens (PREMARIN) vaginal cream Place 1 Applicatorful vaginally daily as needed (moisture).   . fluticasone (FLONASE) 50 MCG/ACT nasal spray Place 2 sprays into both nostrils daily.  . hydrocortisone valerate ointment (WEST-CORT) 0.2 % Apply 1 application topically 2 (two) times daily as needed (itching).   [name] levocetirizine (XYZAL) 5 MG tablet Take 5 mg by mouth every evening.  [name] levothyroxine (SYNTHROID, LEVOTHROID) 75 MCG tablet Take 75  mcg by mouth daily at 6 PM.   . naproxen sodium (ANAPROX) 220 MG tablet Take 220 mg by mouth 2 (two) times daily as needed (pain).  . NON FORMULARY as directed. Allergy injections  . Probiotic Product (PROBIOTIC DAILY PO) Take 1 tablet by mouth daily.   Current Facility-Administered Medications for the 03/05/17 encounter (Office Visit) with Quintella Reichert, MD  Medication  . 0.9 %  sodium chloride infusion     Allergies:   Pepcid [famotidine]; Allopurinol; Amlodipine; Colchicine; Contrast media [iodinated diagnostic agents]; Prilosec [omeprazole]; Atorvastatin; Augmentin [amoxicillin-pot clavulanate]; Cephalexin; Ciprofloxacin; Erythromycin; Sulfonamide derivatives; and  Tetracycline   Social History   Social History  . Marital status: Married    Spouse name: N/A  . Number of children: 2  . Years of education: N/A   Occupational History  . retired    Social History Main Topics  . Smoking status: Former Games developer  . Smokeless tobacco: Never Used  . Alcohol use No  . Drug use: No  . Sexual activity: Not Asked   Other Topics Concern  . None   Social History Narrative   Patient drinks 1-2 cups of caffeine daily.   Patient is right handed.      Family History: The patient's family history includes Brain cancer in her brother; Heart attack in her brother and father; Heart disease in her brother, father, and mother; Hypertension in her brother, mother, and sister; Melanoma in her brother; Ovarian cancer in her sister; Rheum arthritis in her sister; Stroke in her mother. There is no history of Colon cancer.  ROS:   Please see the history of present illness.     All other systems reviewed and are negative.  EKGs/Labs/Other Studies Reviewed:    The following studies were reviewed today: EKG  EKG:  EKG is ordered today.  The ekg ordered today demonstrates AV paced rhythm with frequent PVCs.    Recent Labs: 07/30/2016: BUN 25; Creatinine, Ser 1.33; Potassium 3.6; Sodium 145   Recent Lipid Panel    Component Value Date/Time   CHOL 153 01/07/2015 0304   TRIG 67 01/07/2015 0304   HDL 51 01/07/2015 0304   CHOLHDL 3.0 01/07/2015 0304   VLDL 13 01/07/2015 0304   LDLCALC 89 01/07/2015 0304    Physical Exam:    VS:  BP (!) 110/58   Pulse 85   Ht 5\' 6"  (1.676 m)   Wt 141 lb 9.6 oz (64.2 kg)   BMI 22.85 kg/m     Wt Readings from Last 3 Encounters:  03/05/17 141 lb 9.6 oz (64.2 kg)  02/19/17 141 lb (64 kg)  02/05/17 141 lb 4 oz (64.1 kg)     GEN:  Well nourished, well developed in no acute distress HEENT: Normal NECK: No JVD; No carotid bruits LYMPHATICS: No lymphadenopathy CARDIAC: RRR, no murmurs, rubs, gallops RESPIRATORY:  Clear  to auscultation without rales, wheezing or rhonchi  ABDOMEN: Soft, non-tender, non-distended MUSCULOSKELETAL:  No edema; No deformity  SKIN: Warm and dry NEUROLOGIC:  Alert and oriented x 3 PSYCHIATRIC:  Normal affect   ASSESSMENT:    1. MVP (mitral valve prolapse)   2. Essential hypertension   3. Persistent atrial fibrillation (HCC)   4. AV block   5. DOE (dyspnea on exertion)    PLAN:    In order of problems listed above:  1.  MVP with moderate MR by echo in the past.  Echo in June 2018 showed severe posterior leaflet MVP with at least  moderate MR.  I will check with GI s/p esophageal dilatation to make sure that he is ok to proceed with TEE to evaluated degree of MR.  With her worsening SOB I am concerned that her MR may have worsened.  I will set her up for TEE when ok with GI.   2.  HTN - BP is well controlled on current meds. She will continue on moduretic and Tenormin. 3.  Persistent atrial fibrillation - her last pacer check showed NSR 99% of the time.  She will continue on Tenormin on Eliquis. I will repeat a BMET.   4.  Second degree AV block s/p PPM - this is followed by Dr. [name] in pacer clinic.  5.  SOB - she has been having a lot of SOB and DOE recently that has been limiting her ADLs.  She is having a lot of PVCs on EKG today but PVC load up to July was only 2%.  She will be set up in 2 weeks for another visit to reassess PVC load on paer check.  She is also AV paced with chronic V pacing due to CHB so need to consider LV dysfunction for septal-lateral wall dyssynchronous pacing.  Also I am concerned that her MR may be severe now.  We will get her set up for TEE to assess LVF as well as MR.   I will check a BNP today.     Medication Adjustments/Labs and Tests Ordered: Current medicines are reviewed at length with the patient today.  Concerns regarding medicines are outlined above.  No orders of the defined types were placed in this encounter.  No orders of the defined  types were placed in this encounter.   Signed, Armanda Magic, MD  03/05/2017 9:27 AM    [facility]

## 2017-03-05 NOTE — Progress Notes (Signed)
Pacemaker check in clinic d/t recent SOB with minimal activity x 2 weeks. Normal device function. Thresholds, sensing, impedances consistent with previous measurements. Device programmed to maximize longevity. <1% AT/AF- last AF was in April 2018, on Eliquis. No high ventricular rates noted. 2.2% PVCs since July 2017. Device programmed at appropriate safety margins. Histogram distribution appropriate for patient activity level. Estimated longevity 10.6 years. Patient enrolled in remote follow-up. Merlin transmission scheduled in 2 weeks for PVC count- send to [name].

## 2017-03-06 ENCOUNTER — Telehealth: Payer: Self-pay

## 2017-03-06 ENCOUNTER — Other Ambulatory Visit: Payer: Self-pay

## 2017-03-06 DIAGNOSIS — I4819 Other persistent atrial fibrillation: Secondary | ICD-10-CM

## 2017-03-06 MED ORDER — FUROSEMIDE 40 MG PO TABS: 40.0000 mg | ORAL_TABLET | Freq: Every day | ORAL | 11 refills | Status: DC

## 2017-03-06 MED ORDER — FUROSEMIDE 40 MG PO TABS: 40.0000 mg | ORAL_TABLET | Freq: Two times a day (BID) | ORAL | 0 refills | Status: DC

## 2017-03-06 NOTE — Telephone Encounter (Signed)
-----   Message from Quintella Reichert, MD sent at 03/06/2017  9:14 AM EDT ----- After the 3 days of Lasix 40mg  BID go to 40mg  daily with BMET on Friday

## 2017-03-06 NOTE — Telephone Encounter (Signed)
Reiterated to patient to take Lasix 40 mg BID for 3 days then Lasix 40 mg daily. Confirmed OV with Dayna Dunn next Wednesday. Patient understands she will have BMET redrawn at that time.  Patient understands Lasix refills will be called in at time of OV if Dayna chooses to continue. Patient also understands TEE will be scheduled at time of OV once fluid status is reevaluated (see DG esophagus result notes in Imaging tab).  Patient agrees with treatment plan.

## 2017-03-12 ENCOUNTER — Encounter: Payer: Self-pay | Admitting: Physician Assistant

## 2017-03-12 NOTE — Progress Notes (Signed)
Cardiology Office Note    Date:  03/13/2017  ID:  Amanda [name], DOB Oct 22, 1940, MRN [id number] PCP:  [name], MD  Cardiologist:  [name]   Chief Complaint: f/u shortness of breath  History of Present Illness:  Amanda [name] is a 76 y.o. female with history of PAC's, MVP with MR, second degree AV block s/p St. Jude PPM, PAF on pacer check (now on NOAC for CHADs2VASC score of 4), CKD III, HTN, recent CHF who presents today for followup.   To recap history, stress test in 2015 was normal. Echo in June 2018 showed severe posterior leaflet MVP with at least moderate MR, EF 55-60%, grade 2DD, mild-mod TR. She had a TEE done but the probe could not be passed. She was referred to GI and a barium swallow was done which showed nonspecific esophageal dysmotility with a smooth narrowing of the lower esophageal mucosal ring and 53mm barium pill would not pass into stomach.  She underwent esophageal dilatation by GI and has since been cleared for repeat TEE when necessary (see result note under DG esophagus). When seen back in follow-up 03/05/17 by [name], she was having significant increase in DOE and chest heaviness. She was also having increased PVCs. Device check showed 2.2% PVC burden since Jul 2017. [name] recommended to set her up for re-try at TEE and f/u in 2 weeks to reassess pacer load on PVCs. However, labs showed concern for acute CHF with BNP 2335, K 4.2, Cr 1.36, calcium 10.4, Na 139, normal CBC. [name] recommended to add Lasix 40mg  BID x 3 days then 40mg  daily with follow-up in 1 week. Although not formally cited in chart she reports her amiloride/HCTZ was stopped at that time.  She returns for follow-up today with her daughter. Overall she has noticed mild improvement in her breathing. She is not SOB at rest but does note continued dyspnea on exertion. She also reports mild wooziness when she goes to stand up or walk. This is about the same. She reports over the  spring she was not as active as she usually has been due to the rainy then hot weather, so she also feels she's gradually become more deconditioned. She sometimes describes this like having to take a deep breath. No further chest pain. She has not had any swelling in her legs or stomach. Weight is down 1lb. She has not had any significant weight gain in the last few months. She denies any orthopnea, PND, LEE, syncope.    Past Medical History:  Diagnosis Date  . Allergic rhinitis   . Anemia   . CKD (chronic kidney disease), stage III    stage III  . DJD (degenerative joint disease)   . Glomerulonephritis    Dr [name]  . Hyperlipidemia   . Hyperparathyroidism (HCC)   . Hypertension   . Hypothyroidism   . IBS (irritable bowel syndrome)   . Interstitial cystitis   . Mitral regurgitation   . Mobitz II    a. s/p STJ dual chamber PPM   . MVP (mitral valve prolapse)    moderate posterior MVP with moderate MR and grade II diasotlic dysfunction  . PAC (premature atrial contraction)   . PAF (paroxysmal atrial fibrillation) (HCC)     note on pacer check. CHADS2VASC score is 4 now on Eliquis.  [name] PVC's (premature ventricular contractions)   . S/P placement of cardiac pacemaker   . Small vessel disease, cerebrovascular     Past  Surgical History:  Procedure Laterality Date  . ABDOMINAL HYSTERECTOMY    . BREAST BIOPSY Right   . EP IMPLANTABLE DEVICE N/A 01/07/2015   STJ dual chamber pacemaker implanted by Dr Taylor for 2:1 heart block  . PERIPHERAL VASCULAR CATHETERIZATION N/A 02/03/2015   Procedure: Upper Extremity Venography;  Surgeon: Steven C Klein, MD;  Location: [facility] INVASIVE CV LAB;  Service: Cardiovascular;  Laterality: N/A;  . TONSILLECTOMY      Current Medications: Current Meds  Medication Sig  . apixaban (ELIQUIS) 5 MG TABS tablet Take 1 tablet (5 mg total) by mouth 2 (two) times daily.  . atenolol (TENORMIN) 50 MG tablet Take 50 mg by mouth daily.  . conjugated estrogens  (PREMARIN) vaginal cream Place 1 Applicatorful vaginally daily as needed (moisture).   . fluticasone (FLONASE) 50 MCG/ACT nasal spray Place 2 sprays into both nostrils daily.  . furosemide (LASIX) 40 MG tablet Take 1 tablet (40 mg total) by mouth daily.  . hydrocortisone valerate ointment (WEST-CORT) 0.2 % Apply 1 application topically 2 (two) times daily as needed (itching).   . levocetirizine (XYZAL) 5 MG tablet Take 5 mg by mouth every evening.  . levothyroxine (SYNTHROID, LEVOTHROID) 75 MCG tablet Take 75 mcg by mouth daily at 6 PM.   . naproxen sodium (ANAPROX) 220 MG tablet Take 220 mg by mouth 2 (two) times daily as needed (pain).  . NON FORMULARY as directed. Allergy injections  . Probiotic Product (PROBIOTIC DAILY PO) Take 1 tablet by mouth daily.   Current Facility-Administered Medications for the 03/13/17 encounter (Office Visit) with Abrianna Sidman N, PA-C  Medication  . 0.9 %  sodium chloride infusion     Allergies:   Pepcid [famotidine]; Allopurinol; Amlodipine; Colchicine; Contrast media [iodinated diagnostic agents]; Prilosec [omeprazole]; Atorvastatin; Augmentin [amoxicillin-pot clavulanate]; Cephalexin; Ciprofloxacin; Erythromycin; Sulfonamide derivatives; and Tetracycline   Social History   Social History  . Marital status: Married    Spouse name: N/A  . Number of children: 2  . Years of education: N/A   Occupational History  . retired    Social History Main Topics  . Smoking status: Former Smoker  . Smokeless tobacco: Never Used  . Alcohol use No  . Drug use: No  . Sexual activity: Not Asked   Other Topics Concern  . None   Social History Narrative   Patient drinks 1-2 cups of caffeine daily.   Patient is right handed.      Family History:  Family History  Problem Relation Age of Onset  . Heart disease Mother   . Stroke Mother   . Hypertension Mother   . Heart disease Father   . Heart attack Father   . Ovarian cancer Sister   . Heart disease Brother    . Rheum arthritis Sister   . Melanoma Brother   . Brain cancer Brother   . Heart attack Brother   . Hypertension Brother   . Hypertension Sister   . Colon cancer Neg Hx      ROS:   Please see the history of present illness. All other systems are reviewed and otherwise negative.    PHYSICAL EXAM:   VS:  BP 128/76   Pulse 75   Ht 5' 6" (1.676 m)   Wt 140 lb 1.9 oz (63.6 kg)   BMI 22.62 kg/m   BMI: Body mass index is 22.62 kg/m. GEN: Well nourished, well developed well appearing WF, in no acute distress  HEENT: normocephalic, atraumatic Neck: no JVD, carotid   bruits, or masses Cardiac: RRR; no murmurs, rubs, or gallops, no edema  Respiratory:  clear to auscultation bilaterally, normal work of breathing GI: soft, nontender, nondistended, + BS MS: no deformity or atrophy  Skin: warm and dry, no rash Neuro:  Alert and Oriented x 3, Strength and sensation are intact, follows commands Psych: euthymic mood, full affect  Wt Readings from Last 3 Encounters:  03/13/17 140 lb 1.9 oz (63.6 kg)  03/05/17 141 lb 9.6 oz (64.2 kg)  02/19/17 141 lb (64 kg)      Studies/Labs Reviewed:   EKG:  EKG was ordered today and personally reviewed by me and demonstrates demand pacemaker - AV pacing with occasional PVCs similar to prior.  Recent Labs: 03/05/2017: BUN 31; Creatinine, Ser 1.36; Hemoglobin 14.3; NT-Pro BNP 2,335; Platelets 267; Potassium 4.2; Sodium 139   Lipid Panel    Component Value Date/Time   CHOL 153 01/07/2015 0304   TRIG 67 01/07/2015 0304   HDL 51 01/07/2015 0304   CHOLHDL 3.0 01/07/2015 0304   VLDL 13 01/07/2015 0304   LDLCALC 89 01/07/2015 0304    Additional studies/ records that were reviewed today include: Summarized above.   ASSESSMENT & PLAN:   1. Acute CHF due to valvular disease - she reports mild improvement in dyspnea with Lasix. She continues to note DOE which I suspect is due to her valvular disease. Clinically she does not appear markedly volume  overloaded today so would continue Lasix at present dose. She has had some orthostatic-type wooziness so I would be cautious about increasing diuretic further at this time. BMET is pending. Discussed 2g sodium diet, fluid restriction. See below re: MR. 2. Mitral regurgitation - she feels she would be able to lie flat. She appears stable to proceed with TEE at this time. Risks, benefits discussed with patient and she is agreeable. [name] does not have any availability on 9/7. Will arrange 03/18/16 with Dr. [name] - [name] will be a rounder that week so they could potentially discuss the outcome and next steps together. Will also check 2V CXR for completeness as part of workup for dyspnea. 3. PVCs - device interrogation reports 21% PVC burden, increased further from last check. Check TSH and Mg in addition to previously ordered BMET. I have asked device clinic nurse to forward information to patient's electrophysiologist for further input. 4. CKD III - recheck BMET today. 5. Hypercalcemia - pending reassessment with BMET.  6. Paroxysmal atrial fibrillation - continue Eliquis, pending further assessment of severity of MR.  Disposition: F/u with Dr. Eugenie Filler team app in 1 week after TEE.   Medication Adjustments/Labs and Tests Ordered: Current medicines are reviewed at length with the patient today.  Concerns regarding medicines are outlined above. Medication changes, Labs and Tests ordered today are summarized above and listed in the Patient Instructions accessible in Encounters.   Signed, Laurann Montana, PA-C  03/13/2017 11:23 AM    [facility] [address], [place], [place]  [address] Phone: [phone]; Fax: [phone]

## 2017-03-13 ENCOUNTER — Other Ambulatory Visit: Payer: Medicare Other | Admitting: *Deleted

## 2017-03-13 ENCOUNTER — Ambulatory Visit (INDEPENDENT_AMBULATORY_CARE_PROVIDER_SITE_OTHER): Payer: Medicare Other | Admitting: Physician Assistant

## 2017-03-13 ENCOUNTER — Telehealth: Payer: Self-pay | Admitting: *Deleted

## 2017-03-13 ENCOUNTER — Encounter: Payer: Self-pay | Admitting: *Deleted

## 2017-03-13 ENCOUNTER — Ambulatory Visit
Admission: RE | Admit: 2017-03-13 | Discharge: 2017-03-13 | Disposition: A | Discharge status: Home / Self Care | Payer: Medicare Other | Source: Ambulatory Visit | Attending: Physician Assistant | Admitting: Physician Assistant

## 2017-03-13 ENCOUNTER — Encounter: Payer: Self-pay | Admitting: Physician Assistant

## 2017-03-13 ENCOUNTER — Ambulatory Visit (INDEPENDENT_AMBULATORY_CARE_PROVIDER_SITE_OTHER): Payer: Self-pay | Admitting: *Deleted

## 2017-03-13 VITALS — BP 128/76 | HR 75 | Ht 66.0 in | Wt 140.1 lb

## 2017-03-13 DIAGNOSIS — I5031 Acute diastolic (congestive) heart failure: Secondary | ICD-10-CM

## 2017-03-13 DIAGNOSIS — N183 Chronic kidney disease, stage 3 unspecified: Secondary | ICD-10-CM

## 2017-03-13 DIAGNOSIS — I38 Endocarditis, valve unspecified: Secondary | ICD-10-CM | POA: Diagnosis not present

## 2017-03-13 DIAGNOSIS — I493 Ventricular premature depolarization: Secondary | ICD-10-CM

## 2017-03-13 DIAGNOSIS — R0602 Shortness of breath: Secondary | ICD-10-CM | POA: Diagnosis not present

## 2017-03-13 DIAGNOSIS — Z79899 Other long term (current) drug therapy: Secondary | ICD-10-CM

## 2017-03-13 DIAGNOSIS — I4819 Other persistent atrial fibrillation: Secondary | ICD-10-CM

## 2017-03-13 DIAGNOSIS — I441 Atrioventricular block, second degree: Secondary | ICD-10-CM

## 2017-03-13 DIAGNOSIS — I480 Paroxysmal atrial fibrillation: Secondary | ICD-10-CM | POA: Diagnosis not present

## 2017-03-13 DIAGNOSIS — I340 Nonrheumatic mitral (valve) insufficiency: Secondary | ICD-10-CM | POA: Diagnosis not present

## 2017-03-13 LAB — BASIC METABOLIC PANEL
BUN/Creatinine Ratio: 26 (ref 12–28)
BUN: 45 mg/dL — ABNORMAL HIGH (ref 8–27)
CALCIUM: 10.2 mg/dL (ref 8.7–10.3)
CO2: 31 mmol/L — AB (ref 20–29)
Chloride: 91 mmol/L — ABNORMAL LOW (ref 96–106)
Creatinine, Ser: 1.73 mg/dL — ABNORMAL HIGH (ref 0.57–1.00)
GFR calc Af Amer: 32 mL/min/{1.73_m2} — ABNORMAL LOW (ref >59)
GFR calc non Af Amer: 28 mL/min/{1.73_m2} — ABNORMAL LOW (ref >59)
GLUCOSE: 95 mg/dL (ref 65–99)
POTASSIUM: 3.1 mmol/L — AB (ref 3.5–5.2)
SODIUM: 140 mmol/L (ref 134–144)

## 2017-03-13 LAB — CUP PACEART INCLINIC DEVICE CHECK
Battery Remaining Longevity: 115 mo
Date Time Interrogation Session: 20180905115518
Implantable Lead Location: 753860
Implantable Pulse Generator Implant Date: 20160701
Lead Channel Pacing Threshold Amplitude: 0.75 V
Lead Channel Pacing Threshold Amplitude: 1.25 V
Lead Channel Setting Pacing Amplitude: 1.5 V
Lead Channel Setting Pacing Pulse Width: 0.4 ms
Lead Channel Setting Sensing Sensitivity: 2 mV
MDC IDC LEAD IMPLANT DT: 20160701
MDC IDC LEAD IMPLANT DT: 20160701
MDC IDC LEAD LOCATION: 753859
MDC IDC MSMT BATTERY VOLTAGE: 3.01 V
MDC IDC MSMT LEADCHNL RA IMPEDANCE VALUE: 462.5 Ohm
MDC IDC MSMT LEADCHNL RA PACING THRESHOLD PULSEWIDTH: 0.4 ms
MDC IDC MSMT LEADCHNL RA SENSING INTR AMPL: 2.6 mV
MDC IDC MSMT LEADCHNL RV IMPEDANCE VALUE: 575 Ohm
MDC IDC MSMT LEADCHNL RV PACING THRESHOLD PULSEWIDTH: 0.4 ms
MDC IDC MSMT LEADCHNL RV SENSING INTR AMPL: 3 mV
MDC IDC SET LEADCHNL RA PACING AMPLITUDE: 1.75 V
MDC IDC STAT BRADY RA PERCENT PACED: 39 %
MDC IDC STAT BRADY RV PERCENT PACED: 79 %
Pulse Gen Model: 2240
Pulse Gen Serial Number: 7785935

## 2017-03-13 LAB — MAGNESIUM: MAGNESIUM: 1.8 mg/dL (ref 1.6–2.3)

## 2017-03-13 LAB — TSH: TSH: 1.51 u[IU]/mL (ref 0.450–4.500)

## 2017-03-13 MED ORDER — POTASSIUM CHLORIDE ER 10 MEQ PO TBCR: 10.0000 meq | EXTENDED_RELEASE_TABLET | Freq: Every day | ORAL | 0 refills | Status: DC

## 2017-03-13 MED ORDER — FUROSEMIDE 20 MG PO TABS: 20.0000 mg | ORAL_TABLET | Freq: Every day | ORAL | 3 refills | Status: DC

## 2017-03-13 NOTE — Patient Instructions (Addendum)
Medication Instructions:  Your physician recommends that you continue on your current medications as directed. Please refer to the Current Medication list given to you today.  Labwork: TODAY: BMET, MAGNESIUM, & TSH  Testing/Procedures: A chest x-ray takes a picture of the organs and structures inside the chest, including the heart, lungs, and blood vessels. This test can show several things, including, whether the heart is enlarges; whether fluid is building up in the lungs; and whether pacemaker / defibrillator leads are still in place.  Your physician has requested that you have a TEE. During a TEE, sound waves are used to create images of your heart. It provides your doctor with information about the size and shape of your heart and how well your heart's chambers and valves are working. In this test, a transducer is attached to the end of a flexible tube that's guided down your throat and into your esophagus (the tube leading from you mouth to your stomach) to get a more detailed image of your heart. You are not awake for the procedure. Please see the instruction sheet given to you today. For further information please visit [email or web address]    Follow-Up: Your physician recommends that you schedule a follow-up appointment in: 1 WEEK AFTER 07/18/16 WITH [name] OR DAYNA DUNN, PA-C   Any Other Special Instructions Will Be Listed Below (If Applicable).    Transesophageal Echocardiogram Transesophageal echocardiography (TEE) is a picture test of your heart using sound waves. The pictures taken can give very detailed pictures of your heart. This can help your doctor see if there are problems with your heart. TEE can check:  If your heart has blood clots in it.  How well your heart valves are working.  If you have an infection on the inside of your heart.  Some of the major arteries of your heart.  If your heart valve is working after a Psychologist, forensic.  Your heart before a procedure that  uses a shock to your heart to get the rhythm back to normal.  What happens before the procedure?  Do not eat or drink for 6 hours before the procedure or as told by your doctor.  Make plans to have someone drive you home after the procedure. Do not drive yourself home.  An IV tube will be put in your arm. What happens during the procedure?  You will be given a medicine to help you relax (sedative). It will be given through the IV tube.  A numbing medicine will be sprayed or gargled in the back of your throat to help numb it.  The tip of the probe is placed into the back of your mouth. You will be asked to swallow. This helps to pass the probe into your esophagus.  Once the tip of the probe is in the right place, your doctor can take pictures of your heart.  You may feel pressure at the back of your throat. What happens after the procedure?  You will be taken to a recovery area so the sedative can wear off.  Your throat may be sore and scratchy. This will go away slowly over time.  You will go home when you are fully awake and able to swallow liquids.  You should have someone stay with you for the next 24 hours.  Do not drive or operate machinery for the next 24 hours. This information is not intended to replace advice given to you by your health care provider. Make sure you discuss any questions you  have with your health care provider. Document Released: 04/22/2009 Document Revised: 12/01/2015 Document Reviewed: 12/25/2012 Elsevier Interactive Patient Education  2018 Elsevier Inc.   \ Chest X-Ray A chest X-ray is a painless test that uses radiation to create images of the structures inside of your chest. Chest X-rays are used to look for many health conditions, including heart failure, pneumonia, tuberculosis, rib fractures, breathing disorders, and cancer. They may be used to diagnose chest pain, constant coughing, or trouble breathing. Tell a health care provider  about:  Any allergies you have.  All medicines you are taking, including vitamins, herbs, eye drops, creams, and over-the-counter medicines.  Any surgeries you have had.  Any medical conditions you have.  Whether you are pregnant or may be pregnant. What are the risks? Getting a chest X-ray is a safe procedure. However, you will be exposed to a small amount of radiation. Being exposed to too much radiation over a lifetime can increase the risk of cancer. This risk is small, but it may occur if you have many X-rays throughout your life. What happens before the procedure?  You may be asked to remove glasses, jewelry, and any other metal objects.  You will be asked to undress from the waist up. You may be given a hospital gown to wear.  You may be asked to wear a protective lead apron to protect parts of your body from radiation. What happens during the procedure?  You will be asked to stand still as each picture is taken to get the best possible images.  You will be asked to take a deep breath and hold your breath for a few seconds.  The X-ray machine will create a picture of your chest using a tiny burst of radiation. This is painless.  More pictures may be taken from other angles. Typically, one picture will be taken while you face the X-ray camera, and another picture will be taken from the side while you stand. If you cannot stand, you may be asked to lie down. The procedure may vary among health care providers and hospitals. What happens after the procedure?  The X-ray(s) will be reviewed by your health care provider or an X-ray (radiology) specialist.  It is up to you to get your test results. Ask your health care provider, or the department that is doing the test, when your results will be ready.  Your health care provider will tell you if you need more tests or a follow-up exam. Keep all follow-up visits as told by your health care provider. This is important. Summary  A  chest X-ray is a safe, painless test that is used to examine the inside of the chest, heart, and lungs.  You will need to undress from the waist up and remove jewelry and metal objects before the procedure.  You will be exposed to a small amount of radiation during the procedure.  The X-ray machine will take one or more pictures of your chest while you remain as still as possible.  Later, a health care provider or specialist will review the test results with you. This information is not intended to replace advice given to you by your health care provider. Make sure you discuss any questions you have with your health care provider. Document Released: 08/21/2016 Document Revised: 08/21/2016 Document Reviewed: 08/21/2016 Elsevier Interactive Patient Education  [employer].   If you need a refill on your cardiac medications before your next appointment, please call your pharmacy.

## 2017-03-13 NOTE — Progress Notes (Signed)
PPM interrogation only for PVC count. 21% PVC since 03/05/17 (0.47M). TEE scheduled for 9/10 to evaluate mitral valve. Routing to Dr. [name] for recommendations for [name]. Merlin 03/19/17 for re-evaluation of PVC count, ROV with Dr. [name] July 2019.

## 2017-03-13 NOTE — Telephone Encounter (Signed)
-----   Message from Laurann Montana, [place] sent at 03/13/2017  4:00 PM EDT ----- Please let patient know kidney function has bumped suggestive that she's on the drier side. Would recommend to hold next dose of Lasix and resume the following day at 1/2 dose (20mg  daily). Would call her in KCl daily x 1 to take today and please increase dietary intake of healthy sources of potassium including bananas, squash, yogurt, white beans, sweet potatoes, leafy greens, and avocados. (Conservative dose of potassium chosen due to her renal dysfunction).  Recheck stat BMET on Friday. Dayna Dunn PA-C

## 2017-03-15 ENCOUNTER — Other Ambulatory Visit: Payer: Medicare Other | Admitting: *Deleted

## 2017-03-15 ENCOUNTER — Other Ambulatory Visit: Payer: Self-pay | Admitting: Physician Assistant

## 2017-03-15 DIAGNOSIS — Z79899 Other long term (current) drug therapy: Secondary | ICD-10-CM

## 2017-03-15 DIAGNOSIS — N183 Chronic kidney disease, stage 3 unspecified: Secondary | ICD-10-CM

## 2017-03-15 LAB — BASIC METABOLIC PANEL
BUN/Creatinine Ratio: 22 (ref 12–28)
BUN: 35 mg/dL — AB (ref 8–27)
CALCIUM: 10.5 mg/dL — AB (ref 8.7–10.3)
CHLORIDE: 95 mmol/L — AB (ref 96–106)
CO2: 35 mmol/L — AB (ref 20–29)
Creatinine, Ser: 1.6 mg/dL — ABNORMAL HIGH (ref 0.57–1.00)
GFR calc Af Amer: 36 mL/min/{1.73_m2} — ABNORMAL LOW (ref >59)
GFR calc non Af Amer: 31 mL/min/{1.73_m2} — ABNORMAL LOW (ref >59)
GLUCOSE: 107 mg/dL — AB (ref 65–99)
Potassium: 3.6 mmol/L (ref 3.5–5.2)
Sodium: 137 mmol/L (ref 134–144)

## 2017-03-18 ENCOUNTER — Ambulatory Visit (HOSPITAL_BASED_OUTPATIENT_CLINIC_OR_DEPARTMENT_OTHER): Payer: Medicare Other

## 2017-03-18 ENCOUNTER — Encounter (HOSPITAL_COMMUNITY)
Admission: RE | Disposition: A | Discharge status: Home / Self Care | Payer: Self-pay | Source: Ambulatory Visit | Attending: Cardiovascular Disease

## 2017-03-18 ENCOUNTER — Ambulatory Visit (HOSPITAL_COMMUNITY)
Admission: RE | Admit: 2017-03-18 | Discharge: 2017-03-18 | Disposition: A | Discharge status: Home / Self Care | Payer: Medicare Other | Source: Ambulatory Visit | Attending: Cardiovascular Disease | Admitting: Cardiovascular Disease

## 2017-03-18 ENCOUNTER — Encounter (HOSPITAL_COMMUNITY): Payer: Self-pay | Admitting: *Deleted

## 2017-03-18 ENCOUNTER — Telehealth: Payer: Self-pay | Admitting: Internal Medicine

## 2017-03-18 DIAGNOSIS — I480 Paroxysmal atrial fibrillation: Secondary | ICD-10-CM | POA: Insufficient documentation

## 2017-03-18 DIAGNOSIS — Z79899 Other long term (current) drug therapy: Secondary | ICD-10-CM | POA: Insufficient documentation

## 2017-03-18 DIAGNOSIS — Z881 Allergy status to other antibiotic agents status: Secondary | ICD-10-CM | POA: Insufficient documentation

## 2017-03-18 DIAGNOSIS — I10 Essential (primary) hypertension: Secondary | ICD-10-CM | POA: Insufficient documentation

## 2017-03-18 DIAGNOSIS — I509 Heart failure, unspecified: Secondary | ICD-10-CM | POA: Insufficient documentation

## 2017-03-18 DIAGNOSIS — Z950 Presence of cardiac pacemaker: Secondary | ICD-10-CM | POA: Insufficient documentation

## 2017-03-18 DIAGNOSIS — Z87891 Personal history of nicotine dependence: Secondary | ICD-10-CM | POA: Diagnosis not present

## 2017-03-18 DIAGNOSIS — Z880 Allergy status to penicillin: Secondary | ICD-10-CM | POA: Insufficient documentation

## 2017-03-18 DIAGNOSIS — Z882 Allergy status to sulfonamides status: Secondary | ICD-10-CM | POA: Diagnosis not present

## 2017-03-18 DIAGNOSIS — I130 Hypertensive heart and chronic kidney disease with heart failure and stage 1 through stage 4 chronic kidney disease, or unspecified chronic kidney disease: Secondary | ICD-10-CM | POA: Insufficient documentation

## 2017-03-18 DIAGNOSIS — Z7901 Long term (current) use of anticoagulants: Secondary | ICD-10-CM | POA: Insufficient documentation

## 2017-03-18 DIAGNOSIS — N183 Chronic kidney disease, stage 3 (moderate): Secondary | ICD-10-CM | POA: Diagnosis not present

## 2017-03-18 DIAGNOSIS — K589 Irritable bowel syndrome without diarrhea: Secondary | ICD-10-CM | POA: Insufficient documentation

## 2017-03-18 DIAGNOSIS — E039 Hypothyroidism, unspecified: Secondary | ICD-10-CM | POA: Diagnosis not present

## 2017-03-18 DIAGNOSIS — E785 Hyperlipidemia, unspecified: Secondary | ICD-10-CM | POA: Diagnosis not present

## 2017-03-18 DIAGNOSIS — I4891 Unspecified atrial fibrillation: Secondary | ICD-10-CM | POA: Insufficient documentation

## 2017-03-18 DIAGNOSIS — I340 Nonrheumatic mitral (valve) insufficiency: Secondary | ICD-10-CM | POA: Diagnosis not present

## 2017-03-18 DIAGNOSIS — M1990 Unspecified osteoarthritis, unspecified site: Secondary | ICD-10-CM | POA: Diagnosis not present

## 2017-03-18 HISTORY — PX: TEE WITHOUT CARDIOVERSION: SHX5443

## 2017-03-18 LAB — BASIC METABOLIC PANEL
ANION GAP: 8 (ref 5–15)
BUN: 24 mg/dL — ABNORMAL HIGH (ref 6–20)
CALCIUM: 9.5 mg/dL (ref 8.9–10.3)
CO2: 31 mmol/L (ref 22–32)
Chloride: 102 mmol/L (ref 101–111)
Creatinine, Ser: 1.58 mg/dL — ABNORMAL HIGH (ref 0.44–1.00)
GFR calc Af Amer: 35 mL/min — ABNORMAL LOW (ref >60)
GFR, EST NON AFRICAN AMERICAN: 30 mL/min — AB (ref >60)
GLUCOSE: 102 mg/dL — AB (ref 65–99)
Potassium: 3.1 mmol/L — ABNORMAL LOW (ref 3.5–5.1)
Sodium: 141 mmol/L (ref 135–145)

## 2017-03-18 SURGERY — ECHOCARDIOGRAM, TRANSESOPHAGEAL
Anesthesia: Moderate Sedation

## 2017-03-18 MED ORDER — FENTANYL CITRATE (PF) 100 MCG/2ML IJ SOLN
INTRAMUSCULAR | Status: DC | PRN
  Administered 2017-03-18: 25 ug via INTRAVENOUS
  Administered 2017-03-18 (×2): 12.5 ug via INTRAVENOUS

## 2017-03-18 MED ORDER — MIDAZOLAM HCL 10 MG/2ML IJ SOLN
INTRAMUSCULAR | Status: DC | PRN
  Administered 2017-03-18: 2 mg via INTRAVENOUS
  Administered 2017-03-18: 1 mg via INTRAVENOUS
  Administered 2017-03-18 (×2): .5 mg via INTRAVENOUS

## 2017-03-18 MED ORDER — SODIUM CHLORIDE 0.9 % IV SOLN
INTRAVENOUS | Status: AC | PRN
  Administered 2017-03-18: 500 mL via INTRAVENOUS

## 2017-03-18 MED ORDER — BUTAMBEN-TETRACAINE-BENZOCAINE 2-2-14 % EX AERO
INHALATION_SPRAY | CUTANEOUS | Status: DC | PRN
  Administered 2017-03-18: 2 via TOPICAL

## 2017-03-18 MED ORDER — POTASSIUM CHLORIDE ER 10 MEQ PO TBCR: 10.0000 meq | EXTENDED_RELEASE_TABLET | Freq: Every day | ORAL | 2 refills | Status: DC

## 2017-03-18 MED ORDER — MIDAZOLAM HCL 5 MG/ML IJ SOLN
INTRAMUSCULAR | Status: AC
  Filled 2017-03-18: qty 2

## 2017-03-18 MED ORDER — POTASSIUM CHLORIDE CRYS ER 20 MEQ PO TBCR
40.0000 meq | EXTENDED_RELEASE_TABLET | Freq: Once | ORAL | Status: AC
  Administered 2017-03-18: 40 meq via ORAL
  Filled 2017-03-18: qty 2

## 2017-03-18 MED ORDER — SODIUM CHLORIDE 0.9 % IV SOLN
INTRAVENOUS | Status: DC
Start: 2017-03-18 — End: 2017-03-18

## 2017-03-18 MED ORDER — FENTANYL CITRATE (PF) 100 MCG/2ML IJ SOLN
INTRAMUSCULAR | Status: AC
  Filled 2017-03-18: qty 2

## 2017-03-18 NOTE — Progress Notes (Signed)
  Echocardiogram Echocardiogram Transesophageal has been performed.  Leta Jungling M 03/18/2017, 9:58 AM

## 2017-03-18 NOTE — Interval H&P Note (Signed)
History and Physical Interval Note:  03/18/2017 8:28 AM  Amanda [name]  has presented today for surgery, with the diagnosis of MITRAL REGURGE/MITRAL VALVE PROLAPSE  The various methods of treatment have been discussed with the patient and family. After consideration of risks, benefits and other options for treatment, the patient has consented to  Procedure(s): TRANSESOPHAGEAL ECHOCARDIOGRAM (TEE) (N/A) as a surgical intervention .  The patient's history has been reviewed, patient examined, no change in status, stable for surgery.  I have reviewed the patient's chart and labs.  Questions were answered to the patient's satisfaction.     [name], MD

## 2017-03-18 NOTE — H&P (View-Only) (Signed)
Cardiology Office Note    Date:  03/13/2017  ID:  Amanda [name], DOB Oct 22, 1940, MRN [id number] PCP:  [name], MD  Cardiologist:  [name]   Chief Complaint: f/u shortness of breath  History of Present Illness:  Amanda [name] is a 76 y.o. female with history of PAC's, MVP with MR, second degree AV block s/p St. Jude PPM, PAF on pacer check (now on NOAC for CHADs2VASC score of 4), CKD III, HTN, recent CHF who presents today for followup.   To recap history, stress test in 2015 was normal. Echo in June 2018 showed severe posterior leaflet MVP with at least moderate MR, EF 55-60%, grade 2DD, mild-mod TR. She had a TEE done but the probe could not be passed. She was referred to GI and a barium swallow was done which showed nonspecific esophageal dysmotility with a smooth narrowing of the lower esophageal mucosal ring and 53mm barium pill would not pass into stomach.  She underwent esophageal dilatation by GI and has since been cleared for repeat TEE when necessary (see result note under DG esophagus). When seen back in follow-up 03/05/17 by [name], she was having significant increase in DOE and chest heaviness. She was also having increased PVCs. Device check showed 2.2% PVC burden since Jul 2017. [name] recommended to set her up for re-try at TEE and f/u in 2 weeks to reassess pacer load on PVCs. However, labs showed concern for acute CHF with BNP 2335, K 4.2, Cr 1.36, calcium 10.4, Na 139, normal CBC. [name] recommended to add Lasix 40mg  BID x 3 days then 40mg  daily with follow-up in 1 week. Although not formally cited in chart she reports her amiloride/HCTZ was stopped at that time.  She returns for follow-up today with her daughter. Overall she has noticed mild improvement in her breathing. She is not SOB at rest but does note continued dyspnea on exertion. She also reports mild wooziness when she goes to stand up or walk. This is about the same. She reports over the  spring she was not as active as she usually has been due to the rainy then hot weather, so she also feels she's gradually become more deconditioned. She sometimes describes this like having to take a deep breath. No further chest pain. She has not had any swelling in her legs or stomach. Weight is down 1lb. She has not had any significant weight gain in the last few months. She denies any orthopnea, PND, LEE, syncope.    Past Medical History:  Diagnosis Date  . Allergic rhinitis   . Anemia   . CKD (chronic kidney disease), stage III    stage III  . DJD (degenerative joint disease)   . Glomerulonephritis    Dr [name]  . Hyperlipidemia   . Hyperparathyroidism (HCC)   . Hypertension   . Hypothyroidism   . IBS (irritable bowel syndrome)   . Interstitial cystitis   . Mitral regurgitation   . Mobitz II    a. s/p STJ dual chamber PPM   . MVP (mitral valve prolapse)    moderate posterior MVP with moderate MR and grade II diasotlic dysfunction  . PAC (premature atrial contraction)   . PAF (paroxysmal atrial fibrillation) (HCC)     note on pacer check. CHADS2VASC score is 4 now on Eliquis.  [name] PVC's (premature ventricular contractions)   . S/P placement of cardiac pacemaker   . Small vessel disease, cerebrovascular     Past  Surgical History:  Procedure Laterality Date  . ABDOMINAL HYSTERECTOMY    . BREAST BIOPSY Right   . EP IMPLANTABLE DEVICE N/A 01/07/2015   STJ dual chamber pacemaker implanted by Dr Taylor for 2:1 heart block  . PERIPHERAL VASCULAR CATHETERIZATION N/A 02/03/2015   Procedure: Upper Extremity Venography;  Surgeon: Steven C Klein, MD;  Location: [facility];  Service: Cardiovascular;  Laterality: N/A;  . TONSILLECTOMY      Current Medications: Current Meds  Medication Sig  . apixaban (ELIQUIS) 5 MG TABS tablet Take 1 tablet (5 mg total) by mouth 2 (two) times daily.  . atenolol (TENORMIN) 50 MG tablet Take 50 mg by mouth daily.  . conjugated estrogens  (PREMARIN) vaginal cream Place 1 Applicatorful vaginally daily as needed (moisture).   . fluticasone (FLONASE) 50 MCG/ACT nasal spray Place 2 sprays into both nostrils daily.  . furosemide (LASIX) 40 MG tablet Take 1 tablet (40 mg total) by mouth daily.  . hydrocortisone valerate ointment (WEST-CORT) 0.2 % Apply 1 application topically 2 (two) times daily as needed (itching).   . levocetirizine (XYZAL) 5 MG tablet Take 5 mg by mouth every evening.  . levothyroxine (SYNTHROID, LEVOTHROID) 75 MCG tablet Take 75 mcg by mouth daily at 6 PM.   . naproxen sodium (ANAPROX) 220 MG tablet Take 220 mg by mouth 2 (two) times daily as needed (pain).  . NON FORMULARY as directed. Allergy injections  . Probiotic Product (PROBIOTIC DAILY PO) Take 1 tablet by mouth daily.   Current Facility-Administered Medications for the 03/13/17 encounter (Office Visit) with Yehonatan Grandison N, PA-C  Medication  . 0.9 %  sodium chloride infusion     Allergies:   Pepcid [famotidine]; Allopurinol; Amlodipine; Colchicine; Contrast media [iodinated diagnostic agents]; Prilosec [omeprazole]; Atorvastatin; Augmentin [amoxicillin-pot clavulanate]; Cephalexin; Ciprofloxacin; Erythromycin; Sulfonamide derivatives; and Tetracycline   Social History   Social History  . Marital status: Married    Spouse name: N/A  . Number of children: 2  . Years of education: N/A   Occupational History  . retired    Social History Main Topics  . Smoking status: Former Smoker  . Smokeless tobacco: Never Used  . Alcohol use No  . Drug use: No  . Sexual activity: Not Asked   Other Topics Concern  . None   Social History Narrative   Patient drinks 1-2 cups of caffeine daily.   Patient is right handed.      Family History:  Family History  Problem Relation Age of Onset  . Heart disease Mother   . Stroke Mother   . Hypertension Mother   . Heart disease Father   . Heart attack Father   . Ovarian cancer Sister   . Heart disease Brother    . Rheum arthritis Sister   . Melanoma Brother   . Brain cancer Brother   . Heart attack Brother   . Hypertension Brother   . Hypertension Sister   . Colon cancer Neg Hx      ROS:   Please see the history of present illness. All other systems are reviewed and otherwise negative.    PHYSICAL EXAM:   VS:  BP 128/76   Pulse 75   Ht 5' 6" (1.676 m)   Wt 140 lb 1.9 oz (63.6 kg)   BMI 22.62 kg/m   BMI: Body mass index is 22.62 kg/m. GEN: Well nourished, well developed well appearing WF, in no acute distress  HEENT: normocephalic, atraumatic Neck: no JVD, carotid   bruits, or masses Cardiac: RRR; no murmurs, rubs, or gallops, no edema  Respiratory:  clear to auscultation bilaterally, normal work of breathing GI: soft, nontender, nondistended, + BS MS: no deformity or atrophy  Skin: warm and dry, no rash Neuro:  Alert and Oriented x 3, Strength and sensation are intact, follows commands Psych: euthymic mood, full affect  Wt Readings from Last 3 Encounters:  03/13/17 140 lb 1.9 oz (63.6 kg)  03/05/17 141 lb 9.6 oz (64.2 kg)  02/19/17 141 lb (64 kg)      Studies/Labs Reviewed:   EKG:  EKG was ordered today and personally reviewed by me and demonstrates demand pacemaker - AV pacing with occasional PVCs similar to prior.  Recent Labs: 03/05/2017: BUN 31; Creatinine, Ser 1.36; Hemoglobin 14.3; NT-Pro BNP 2,335; Platelets 267; Potassium 4.2; Sodium 139   Lipid Panel    Component Value Date/Time   CHOL 153 01/07/2015 0304   TRIG 67 01/07/2015 0304   HDL 51 01/07/2015 0304   CHOLHDL 3.0 01/07/2015 0304   VLDL 13 01/07/2015 0304   LDLCALC 89 01/07/2015 0304    Additional studies/ records that were reviewed today include: Summarized above.   ASSESSMENT & PLAN:   1. Acute CHF due to valvular disease - she reports mild improvement in dyspnea with Lasix. She continues to note DOE which I suspect is due to her valvular disease. Clinically she does not appear markedly volume  overloaded today so would continue Lasix at present dose. She has had some orthostatic-type wooziness so I would be cautious about increasing diuretic further at this time. BMET is pending. Discussed 2g sodium diet, fluid restriction. See below re: MR. 2. Mitral regurgitation - she feels she would be able to lie flat. She appears stable to proceed with TEE at this time. Risks, benefits discussed with patient and she is agreeable. [name] does not have any availability on 9/7. Will arrange 03/18/16 with Dr. [name] - [name] will be a rounder that week so they could potentially discuss the outcome and next steps together. Will also check 2V CXR for completeness as part of workup for dyspnea. 3. PVCs - device interrogation reports 21% PVC burden, increased further from last check. Check TSH and Mg in addition to previously ordered BMET. I have asked device clinic nurse to forward information to patient's electrophysiologist for further input. 4. CKD III - recheck BMET today. 5. Hypercalcemia - pending reassessment with BMET.  6. Paroxysmal atrial fibrillation - continue Eliquis, pending further assessment of severity of MR.  Disposition: F/u with Dr. Eugenie Filler team app in 1 week after TEE.   Medication Adjustments/Labs and Tests Ordered: Current medicines are reviewed at length with the patient today.  Concerns regarding medicines are outlined above. Medication changes, Labs and Tests ordered today are summarized above and listed in the Patient Instructions accessible in Encounters.   Signed, Laurann Montana, PA-C  03/13/2017 11:23 AM    [facility] [address], [place], [place]  [address] Phone: [phone]; Fax: [phone]

## 2017-03-18 NOTE — Telephone Encounter (Signed)
error 

## 2017-03-18 NOTE — CV Procedure (Signed)
Brief TEE Note  LVEF 55-60% Severe prolapse of the P2 segment of the mitral valve Severe mitral regurgitation directed anteriorly.  Multiple jets Mild TR Trivial AR For additional details see full report   During this procedure the patient is administered a total of Versed 4 mg and Fentanyl 50 mcg to achieve and maintain moderate conscious sedation.  The patient's heart rate, blood pressure, and oxygen saturation are monitored continuously during the procedure. The period of conscious sedation is 28 minutes, of which I was present face-to-face 100% of this time.   Suzetta Timko C. [name], MD, [facility] 03/18/2017 9:27 AM

## 2017-03-18 NOTE — Discharge Instructions (Signed)
TEE ° °YOU HAD AN CARDIAC PROCEDURE TODAY: Refer to the procedure report and other information in the discharge instructions given to you for any specific questions about what was found during the examination. If this information does not answer your questions, please call [facility] office at [phone] to clarify.  ° °DIET: Your first meal following the procedure should be a light meal and then it is ok to progress to your normal diet. A half-sandwich or bowl of soup is an example of a good first meal. Heavy or fried foods are harder to digest and may make you feel nauseous or bloated. Drink plenty of fluids but you should avoid alcoholic beverages for 24 hours. If you had a esophageal dilation, please see attached instructions for diet.  ° °ACTIVITY: Your care partner should take you home directly after the procedure. You should plan to take it easy, moving slowly for the rest of the day. You can resume normal activity the day after the procedure however YOU SHOULD NOT DRIVE, use power tools, machinery or perform tasks that involve climbing or major physical exertion for 24 hours (because of the sedation medicines used during the test).  ° °SYMPTOMS TO REPORT IMMEDIATELY: °A cardiologist can be reached at any hour. Please call [phone] for any of the following symptoms:  °Vomiting of blood or coffee ground material  °New, significant abdominal pain  °New, significant chest pain or pain under the shoulder blades  °Painful or persistently difficult swallowing  °New shortness of breath  °Black, tarry-looking or red, bloody stools ° °FOLLOW UP:  °Please also call with any specific questions about appointments or follow up tests. ° ° °Moderate Conscious Sedation, Adult, Care After °These instructions provide you with information about caring for yourself after your procedure. Your health care provider may also give you more specific instructions. Your treatment has been planned according to current medical  practices, but problems sometimes occur. Call your health care provider if you have any problems or questions after your procedure. °What can I expect after the procedure? °After your procedure, it is common: °· To feel sleepy for several hours. °· To feel clumsy and have poor balance for several hours. °· To have poor judgment for several hours. °· To vomit if you eat too soon. ° °Follow these instructions at home: °For at least 24 hours after the procedure: ° °· Do not: °? Participate in activities where you could fall or become injured. °? Drive. °? Use heavy machinery. °? Drink alcohol. °? Take sleeping pills or medicines that cause drowsiness. °? Make important decisions or sign legal documents. °? Take care of children on your own. °· Rest. °Eating and drinking °· Follow the diet recommended by your health care provider. °· If you vomit: °? Drink water, juice, or soup when you can drink without vomiting. °? Make sure you have little or no nausea before eating solid foods. °General instructions °· Have a responsible adult stay with you until you are awake and alert. °· Take over-the-counter and prescription medicines only as told by your health care provider. °· If you smoke, do not smoke without supervision. °· Keep all follow-up visits as told by your health care provider. This is important. °Contact a health care provider if: °· You keep feeling nauseous or you keep vomiting. °· You feel light-headed. °· You develop a rash. °· You have a fever. °Get help right away if: °· You have trouble breathing. °This information is not intended to replace advice   given to you by your health care provider. Make sure you discuss any questions you have with your health care provider. °Document Released: 04/15/2013 Document Revised: 11/28/2015 Document Reviewed: 10/15/2015 °Elsevier Interactive Patient Education © 2018 [employer]. ° °

## 2017-03-19 ENCOUNTER — Ambulatory Visit (INDEPENDENT_AMBULATORY_CARE_PROVIDER_SITE_OTHER): Payer: Self-pay | Admitting: *Deleted

## 2017-03-19 DIAGNOSIS — Z950 Presence of cardiac pacemaker: Secondary | ICD-10-CM

## 2017-03-19 NOTE — Progress Notes (Signed)
Remote pacemaker transmission.   

## 2017-03-20 ENCOUNTER — Encounter: Payer: Self-pay | Admitting: Cardiology

## 2017-03-20 ENCOUNTER — Telehealth: Payer: Self-pay | Admitting: Physician Assistant

## 2017-03-20 ENCOUNTER — Other Ambulatory Visit: Payer: Self-pay | Admitting: Physician Assistant

## 2017-03-20 DIAGNOSIS — I341 Nonrheumatic mitral (valve) prolapse: Secondary | ICD-10-CM

## 2017-03-20 DIAGNOSIS — Z01818 Encounter for other preprocedural examination: Secondary | ICD-10-CM

## 2017-03-20 DIAGNOSIS — I340 Nonrheumatic mitral (valve) insufficiency: Secondary | ICD-10-CM

## 2017-03-20 NOTE — Telephone Encounter (Addendum)
Sending to [employer] - TEE results discussed with [name] - severe mitral valve prolapse and mitral regurgitation. We feel she will likely need surgery on his mitral valve. Needs pre-operative right and left heart cath. I called patient to review. Risks and benefits of cardiac catheterization have been discussed with the patient. These include bleeding, infection, kidney damage, stroke, heart attack, death. The patient understands these risks and is willing to proceed. She states she's been feeling well these last few days.  I have put her on the schedule for Tuesday 03/26/17 at noon with Dr. Excell Seltzer. Will forward this message to nurse to help run through pre-cert.  Will also need the following: -  Please obtain BMET, CBC, PT/INR stat on Monday 03/25/17. I will be out of the office that day so this will need to be reviewed by one of my colleagues in the office.  - Per discussion with [name], last dose of Eliquis should be Sunday morning (9/16). - Please refer to cardiothoracic surgery for severe mitral regurgitation. This appointment ideally would be after heart cath.  - hold Lasix and potassium the day of the cath - Please discuss with Janne Napoleon tomorrow regarding pre-medication protocol for outpatients (new protocol by radiology, no longer part of pre-cath orders). I have cc'd her on this message. I wrote pre-cath orders but have not included the pre-medication yet.  Dayna Dunn PA-C

## 2017-03-21 ENCOUNTER — Telehealth: Payer: Self-pay | Admitting: *Deleted

## 2017-03-21 ENCOUNTER — Other Ambulatory Visit: Payer: Self-pay | Admitting: Physician Assistant

## 2017-03-21 LAB — CUP PACEART REMOTE DEVICE CHECK
Implantable Lead Implant Date: 20160701
Implantable Lead Location: 753860
Implantable Pulse Generator Implant Date: 20160701
MDC IDC LEAD IMPLANT DT: 20160701
MDC IDC LEAD LOCATION: 753859
MDC IDC PG SERIAL: 7785935
MDC IDC SESS DTM: 20180913150903

## 2017-03-21 MED ORDER — PREDNISONE 50 MG PO TABS: ORAL_TABLET | ORAL | 0 refills | Status: DC

## 2017-03-21 NOTE — Telephone Encounter (Signed)
I called pt to discuss her heart cath instructions and to go over the pre-medication instructions.  Pt also aware that she needs to come to the office Monday, 03/25/17, for STAT labs. She has been advised that we have referred her to Cardiothoracic Surgery for MR. Pt verbalized understanding of all instructions and denied any question.  I offered to go over these as well with her daughter, and she will have her daughter call me back.  Please see instruction sheet below:   [facility] CARDIOVASCULAR DIVISION [facility] ST OFFICE [address], [address] [place] [address] Dept: [phone] Loc: [phone]  Amanda [name]  03/21/2017  You are scheduled for a Cardiac Catheterization on Tuesday, September 18 with Dr. Tonny Bollman.  1. Please arrive at the [facility] (Main Entrance A) at [facility]: [address] [place], [place] [address] at 9:30 AM (two hours before your procedure to ensure your preparation). Free valet parking service is available.   Special note: Every effort is made to have your procedure done on time. Please understand that emergencies sometimes delay scheduled procedures.  2. Diet: Do not eat or drink anything after midnight prior to your procedure except sips of water to take medications.  3. Labs: Monday STAT LABS 03/25/17   4. Medication instructions in preparation for your procedure:  Stop taking Eliquis (Apixiban) on Sunday, September 16.After the morning dose  Hold your Lasix and Potassium on the morning of your procedure  YOU ARE BEING PRE-MEDICATED FOR THE ALLERGIC REACTION THAT YOU HAVE TO THE CONTRAST DYE:  INSTRUCTIONS ARE:  TAKE PILL 1 OF PREDNISONE 03/24/17 AT 11:00 P.M. TAKE PILL 2 OF PREDNISONE 03/25/17 AT 5:00 A.M. TAKE PILL 3 OF PREDNISONE 03/25/17 AS YOU ARE WALKING INTO THE HOSPITAL AROUND 10:00 A.M.  5. Plan for one night stay--bring personal belongings. 6. Bring a current list of  your medications and current insurance cards. 7. You MUST have a responsible person to drive you home. 8. Someone MUST be with you the first 24 hours after you arrive home or your discharge will be delayed. 9. Please wear clothes that are easy to get on and off and wear slip-on shoes.  Thank you for allowing [place] to care for you!   -- [facility]

## 2017-03-21 NOTE — Telephone Encounter (Signed)
Discussed with cath lab nurse Amanda [name].  Current protocol is to give prednisone 50mg  approximately 13 hours, 7 hours, 1 hour prior to cath.  Amanda [name], Please send in rx for prednisone 50mg  #3 as directed - instruct her to please take 1 tablet at 11pm on Monday night, 1 tablet at 5am on Tuesday morning, and take the last tablet right as she is walking into the hospital the AM of cath on Tuesday (around 10am). I will write for the on-call IV benadryl.   Please offer to review instructions with a family member as well as her daughter was helping to assist her with medications in the past.   Amanda Spies PA-C

## 2017-03-21 NOTE — Telephone Encounter (Signed)
Pt daughter, [name] and pt, both, returned my call.  We have went over all instructions for her heart cath and pre medication for the allergic reaction. See phone note. Some corrections: pts times are correct on the pre-med instructions, but the dates are incorrect. 1st dose is 03/25/17 11 pm 2nd dose 03/26/17 5 a.m. 3rd dose 03/26/17 10 a.m  Pt and daughter are both aware..  I told pt correct, just typed it incorrect.

## 2017-03-25 ENCOUNTER — Other Ambulatory Visit: Payer: Medicare Other | Admitting: *Deleted

## 2017-03-25 ENCOUNTER — Telehealth: Payer: Self-pay | Admitting: Internal Medicine

## 2017-03-25 ENCOUNTER — Telehealth: Payer: Self-pay

## 2017-03-25 DIAGNOSIS — Z01818 Encounter for other preprocedural examination: Secondary | ICD-10-CM

## 2017-03-25 LAB — BASIC METABOLIC PANEL
BUN/Creatinine Ratio: 13 (ref 12–28)
BUN: 21 mg/dL (ref 8–27)
CO2: 33 mmol/L — AB (ref 20–29)
CREATININE: 1.61 mg/dL — AB (ref 0.57–1.00)
Calcium: 10 mg/dL (ref 8.7–10.3)
Chloride: 104 mmol/L (ref 96–106)
GFR, EST AFRICAN AMERICAN: 35 mL/min/{1.73_m2} — AB (ref >59)
GFR, EST NON AFRICAN AMERICAN: 31 mL/min/{1.73_m2} — AB (ref >59)
Glucose: 110 mg/dL — ABNORMAL HIGH (ref 65–99)
Potassium: 3.1 mmol/L — ABNORMAL LOW (ref 3.5–5.2)
SODIUM: 143 mmol/L (ref 134–144)

## 2017-03-25 LAB — CBC
Hematocrit: 36.5 % (ref 34.0–46.6)
Hemoglobin: 12.6 g/dL (ref 11.1–15.9)
MCH: 30.3 pg (ref 26.6–33.0)
MCHC: 34.5 g/dL (ref 31.5–35.7)
MCV: 88 fL (ref 79–97)
PLATELETS: 221 10*3/uL (ref 150–379)
RBC: 4.16 x10E6/uL (ref 3.77–5.28)
RDW: 12.4 % (ref 12.3–15.4)
WBC: 11 10*3/uL — AB (ref 3.4–10.8)

## 2017-03-25 LAB — PROTIME-INR
INR: 1 (ref 0.8–1.2)
Prothrombin Time: 10.6 s (ref 9.1–12.0)

## 2017-03-25 NOTE — Telephone Encounter (Signed)
Medial Records mailed out to patients Home address per her Release Form.

## 2017-03-25 NOTE — Telephone Encounter (Signed)
Patient contacted pre-catheterization at [facility] scheduled for:  03/26/2017 @ 1200 Verified arrival time and place:  NT @ 1000 Confirmed AM meds to be taken pre-cath with sip of water: Pt with dye allergy-Pt with orders for prednisone 50 mg q 6 hours x 3 doses prior to arrival for cath. Notified Pt to take ASA 81 mg and 50 mg benadryl with last dose of prednisone Pt last dose Eliquis Sunday AM  Addl concerns:  Pt with Cr 3.34.  Pt with repeat lab work today.  Will follow and discuss with DOD if Pt she be admitted early for fluid hydration.

## 2017-03-25 NOTE — Telephone Encounter (Signed)
Stat labs drawn today available:  Cr 1.61 and K+ 3.1 Spoke with DOD, [name] (also Pt cardiologist) Received following orders: Take 40 meq K+ by mouth now, then 4 hours later take 20 meq by mouth Hold lasix today and tomorrow Repeat stat BMET on arrival to short stay tomorrow am  Pt given instruction.  Pt indicates understanding.  Gave this nurse name and # if any further questions.

## 2017-03-26 ENCOUNTER — Ambulatory Visit (HOSPITAL_COMMUNITY)
Admission: RE | Admit: 2017-03-26 | Discharge: 2017-03-26 | Disposition: A | Discharge status: Home / Self Care | Payer: Medicare Other | Source: Ambulatory Visit | Attending: Cardiovascular Disease | Admitting: Cardiovascular Disease

## 2017-03-26 ENCOUNTER — Encounter (HOSPITAL_COMMUNITY)
Admission: RE | Disposition: A | Discharge status: Home / Self Care | Payer: Self-pay | Source: Ambulatory Visit | Attending: Cardiovascular Disease

## 2017-03-26 DIAGNOSIS — I340 Nonrheumatic mitral (valve) insufficiency: Secondary | ICD-10-CM | POA: Diagnosis present

## 2017-03-26 DIAGNOSIS — N183 Chronic kidney disease, stage 3 (moderate): Secondary | ICD-10-CM | POA: Insufficient documentation

## 2017-03-26 DIAGNOSIS — I10 Essential (primary) hypertension: Secondary | ICD-10-CM | POA: Diagnosis present

## 2017-03-26 DIAGNOSIS — Z950 Presence of cardiac pacemaker: Secondary | ICD-10-CM | POA: Insufficient documentation

## 2017-03-26 DIAGNOSIS — Z880 Allergy status to penicillin: Secondary | ICD-10-CM | POA: Insufficient documentation

## 2017-03-26 DIAGNOSIS — R0609 Other forms of dyspnea: Secondary | ICD-10-CM | POA: Diagnosis not present

## 2017-03-26 DIAGNOSIS — I130 Hypertensive heart and chronic kidney disease with heart failure and stage 1 through stage 4 chronic kidney disease, or unspecified chronic kidney disease: Secondary | ICD-10-CM | POA: Insufficient documentation

## 2017-03-26 DIAGNOSIS — E039 Hypothyroidism, unspecified: Secondary | ICD-10-CM | POA: Insufficient documentation

## 2017-03-26 DIAGNOSIS — I441 Atrioventricular block, second degree: Secondary | ICD-10-CM | POA: Diagnosis not present

## 2017-03-26 DIAGNOSIS — E785 Hyperlipidemia, unspecified: Secondary | ICD-10-CM | POA: Diagnosis not present

## 2017-03-26 DIAGNOSIS — Z881 Allergy status to other antibiotic agents status: Secondary | ICD-10-CM | POA: Diagnosis not present

## 2017-03-26 DIAGNOSIS — Z79899 Other long term (current) drug therapy: Secondary | ICD-10-CM | POA: Diagnosis not present

## 2017-03-26 DIAGNOSIS — E213 Hyperparathyroidism, unspecified: Secondary | ICD-10-CM | POA: Insufficient documentation

## 2017-03-26 DIAGNOSIS — Z7901 Long term (current) use of anticoagulants: Secondary | ICD-10-CM | POA: Insufficient documentation

## 2017-03-26 DIAGNOSIS — Z882 Allergy status to sulfonamides status: Secondary | ICD-10-CM | POA: Diagnosis not present

## 2017-03-26 DIAGNOSIS — I480 Paroxysmal atrial fibrillation: Secondary | ICD-10-CM | POA: Diagnosis not present

## 2017-03-26 DIAGNOSIS — Z87891 Personal history of nicotine dependence: Secondary | ICD-10-CM | POA: Insufficient documentation

## 2017-03-26 DIAGNOSIS — I341 Nonrheumatic mitral (valve) prolapse: Secondary | ICD-10-CM | POA: Diagnosis present

## 2017-03-26 HISTORY — PX: RIGHT/LEFT HEART CATH AND CORONARY ANGIOGRAPHY: CATH118266

## 2017-03-26 LAB — BASIC METABOLIC PANEL
ANION GAP: 6 (ref 5–15)
BUN: 22 mg/dL — ABNORMAL HIGH (ref 6–20)
CHLORIDE: 105 mmol/L (ref 101–111)
CO2: 26 mmol/L (ref 22–32)
Calcium: 10 mg/dL (ref 8.9–10.3)
Creatinine, Ser: 1.53 mg/dL — ABNORMAL HIGH (ref 0.44–1.00)
GFR calc Af Amer: 37 mL/min — ABNORMAL LOW (ref >60)
GFR calc non Af Amer: 32 mL/min — ABNORMAL LOW (ref >60)
GLUCOSE: 185 mg/dL — AB (ref 65–99)
POTASSIUM: 4.1 mmol/L (ref 3.5–5.1)
Sodium: 137 mmol/L (ref 135–145)

## 2017-03-26 LAB — POCT I-STAT 3, VENOUS BLOOD GAS (G3P V)
ACID-BASE EXCESS: 2 mmol/L (ref 0.0–2.0)
BICARBONATE: 24.5 mmol/L (ref 20.0–28.0)
Bicarbonate: 26 mmol/L (ref 20.0–28.0)
O2 SAT: 69 %
O2 SAT: 68 %
PCO2 VEN: 37.5 mmHg — AB (ref 44.0–60.0)
PH VEN: 7.438 — AB (ref 7.250–7.430)
PO2 VEN: 34 mmHg (ref 32.0–45.0)
TCO2: 27 mmol/L (ref 22–32)
TCO2: 26 mmol/L (ref 22–32)
pCO2, Ven: 36.1 mmHg — ABNORMAL LOW (ref 44.0–60.0)
pH, Ven: 7.449 — ABNORMAL HIGH (ref 7.250–7.430)
pO2, Ven: 34 mmHg (ref 32.0–45.0)

## 2017-03-26 SURGERY — RIGHT/LEFT HEART CATH AND CORONARY ANGIOGRAPHY
Anesthesia: LOCAL

## 2017-03-26 MED ORDER — HEPARIN SODIUM (PORCINE) 1000 UNIT/ML IJ SOLN
INTRAMUSCULAR | Status: DC | PRN
  Administered 2017-03-26: 3500 [IU] via INTRAVENOUS

## 2017-03-26 MED ORDER — VERAPAMIL HCL 2.5 MG/ML IV SOLN
INTRAVENOUS | Status: AC
  Filled 2017-03-26: qty 2

## 2017-03-26 MED ORDER — IOPAMIDOL (ISOVUE-370) INJECTION 76%
INTRAVENOUS | Status: DC | PRN
  Administered 2017-03-26: 60 mL via INTRAVENOUS

## 2017-03-26 MED ORDER — DIPHENHYDRAMINE HCL 50 MG/ML IJ SOLN: 50.0000 mg | Freq: Once | INTRAMUSCULAR | Status: DC

## 2017-03-26 MED ORDER — FENTANYL CITRATE (PF) 100 MCG/2ML IJ SOLN
INTRAMUSCULAR | Status: DC | PRN
  Administered 2017-03-26: 25 ug via INTRAVENOUS

## 2017-03-26 MED ORDER — FENTANYL CITRATE (PF) 100 MCG/2ML IJ SOLN
INTRAMUSCULAR | Status: AC
  Filled 2017-03-26: qty 2

## 2017-03-26 MED ORDER — SODIUM CHLORIDE 0.9 % IV SOLN
INTRAVENOUS | Status: DC
  Administered 2017-03-26: 11:00:00 via INTRAVENOUS

## 2017-03-26 MED ORDER — VERAPAMIL HCL 2.5 MG/ML IV SOLN
INTRAVENOUS | Status: DC | PRN
  Administered 2017-03-26: 10 mL via INTRA_ARTERIAL

## 2017-03-26 MED ORDER — HEPARIN (PORCINE) IN NACL 2-0.9 UNIT/ML-% IJ SOLN
INTRAMUSCULAR | Status: AC
  Filled 2017-03-26: qty 1000

## 2017-03-26 MED ORDER — MIDAZOLAM HCL 2 MG/2ML IJ SOLN
INTRAMUSCULAR | Status: DC | PRN
  Administered 2017-03-26: 1 mg via INTRAVENOUS

## 2017-03-26 MED ORDER — LIDOCAINE HCL (PF) 1 % IJ SOLN
INTRAMUSCULAR | Status: DC | PRN
  Administered 2017-03-26: 5 mL

## 2017-03-26 MED ORDER — MIDAZOLAM HCL 2 MG/2ML IJ SOLN
INTRAMUSCULAR | Status: AC
  Filled 2017-03-26: qty 2

## 2017-03-26 MED ORDER — IOPAMIDOL (ISOVUE-370) INJECTION 76%
INTRAVENOUS | Status: AC
  Filled 2017-03-26: qty 100

## 2017-03-26 MED ORDER — HEPARIN (PORCINE) IN NACL 2-0.9 UNIT/ML-% IJ SOLN
INTRAMUSCULAR | Status: AC | PRN
  Administered 2017-03-26: 1000 mL

## 2017-03-26 MED ORDER — SODIUM CHLORIDE 0.9 % IV SOLN: INTRAVENOUS | Status: AC

## 2017-03-26 MED ORDER — SODIUM CHLORIDE 0.9 % IV SOLN: 250.0000 mL | INTRAVENOUS | Status: DC | PRN

## 2017-03-26 MED ORDER — SODIUM CHLORIDE 0.9% FLUSH: 3.0000 mL | Freq: Two times a day (BID) | INTRAVENOUS | Status: DC

## 2017-03-26 MED ORDER — HEPARIN SODIUM (PORCINE) 1000 UNIT/ML IJ SOLN
INTRAMUSCULAR | Status: AC
  Filled 2017-03-26: qty 1

## 2017-03-26 MED ORDER — LIDOCAINE HCL 2 % IJ SOLN
INTRAMUSCULAR | Status: AC
  Filled 2017-03-26: qty 10

## 2017-03-26 MED ORDER — ONDANSETRON HCL 4 MG/2ML IJ SOLN: 4.0000 mg | Freq: Four times a day (QID) | INTRAMUSCULAR | Status: DC | PRN

## 2017-03-26 MED ORDER — DIPHENHYDRAMINE HCL 25 MG PO CAPS: 50.0000 mg | ORAL_CAPSULE | Freq: Once | ORAL | Status: DC

## 2017-03-26 MED ORDER — SODIUM CHLORIDE 0.9% FLUSH: 3.0000 mL | INTRAVENOUS | Status: DC | PRN

## 2017-03-26 SURGICAL SUPPLY — 13 items
CATH BALLN WEDGE 5F 110CM (CATHETERS) ×1 IMPLANT
CATH INFINITI 5 FR JL3.5 (CATHETERS) ×1 IMPLANT
CATH INFINITI JR4 5F (CATHETERS) ×1 IMPLANT
DEVICE RAD COMP TR BAND LRG (VASCULAR PRODUCTS) ×1 IMPLANT
DEVICE RAD TR BAND REGULAR (VASCULAR PRODUCTS) ×1 IMPLANT
GLIDESHEATH SLEND A-KIT 6F 22G (SHEATH) ×1 IMPLANT
GUIDEWIRE INQWIRE 1.5J.035X260 (WIRE) IMPLANT
INQWIRE 1.5J .035X260CM (WIRE) ×2
KIT HEART LEFT (KITS) ×2 IMPLANT
PACK CARDIAC CATHETERIZATION (CUSTOM PROCEDURE TRAY) ×2 IMPLANT
SHEATH GLIDE SLENDER 4/5FR (SHEATH) ×1 IMPLANT
TRANSDUCER W/STOPCOCK (MISCELLANEOUS) ×2 IMPLANT
TUBING CIL FLEX 10 FLL-RA (TUBING) ×2 IMPLANT

## 2017-03-26 NOTE — Discharge Instructions (Signed)

## 2017-03-26 NOTE — H&P (View-Only) (Signed)
Cardiology Office Note    Date:  03/13/2017  ID:  Amanda [name], DOB Oct 22, 1940, MRN [id number] PCP:  [name], MD  Cardiologist:  [name]   Chief Complaint: f/u shortness of breath  History of Present Illness:  Amanda [name] is a 76 y.o. female with history of PAC's, MVP with MR, second degree AV block s/p St. Jude PPM, PAF on pacer check (now on NOAC for CHADs2VASC score of 4), CKD III, HTN, recent CHF who presents today for followup.   To recap history, stress test in 2015 was normal. Echo in June 2018 showed severe posterior leaflet MVP with at least moderate MR, EF 55-60%, grade 2DD, mild-mod TR. She had a TEE done but the probe could not be passed. She was referred to GI and a barium swallow was done which showed nonspecific esophageal dysmotility with a smooth narrowing of the lower esophageal mucosal ring and 53mm barium pill would not pass into stomach.  She underwent esophageal dilatation by GI and has since been cleared for repeat TEE when necessary (see result note under DG esophagus). When seen back in follow-up 03/05/17 by [name], she was having significant increase in DOE and chest heaviness. She was also having increased PVCs. Device check showed 2.2% PVC burden since Jul 2017. [name] recommended to set her up for re-try at TEE and f/u in 2 weeks to reassess pacer load on PVCs. However, labs showed concern for acute CHF with BNP 2335, K 4.2, Cr 1.36, calcium 10.4, Na 139, normal CBC. [name] recommended to add Lasix 40mg  BID x 3 days then 40mg  daily with follow-up in 1 week. Although not formally cited in chart she reports her amiloride/HCTZ was stopped at that time.  She returns for follow-up today with her daughter. Overall she has noticed mild improvement in her breathing. She is not SOB at rest but does note continued dyspnea on exertion. She also reports mild wooziness when she goes to stand up or walk. This is about the same. She reports over the  spring she was not as active as she usually has been due to the rainy then hot weather, so she also feels she's gradually become more deconditioned. She sometimes describes this like having to take a deep breath. No further chest pain. She has not had any swelling in her legs or stomach. Weight is down 1lb. She has not had any significant weight gain in the last few months. She denies any orthopnea, PND, LEE, syncope.    Past Medical History:  Diagnosis Date  . Allergic rhinitis   . Anemia   . CKD (chronic kidney disease), stage III    stage III  . DJD (degenerative joint disease)   . Glomerulonephritis    Dr [name]  . Hyperlipidemia   . Hyperparathyroidism (HCC)   . Hypertension   . Hypothyroidism   . IBS (irritable bowel syndrome)   . Interstitial cystitis   . Mitral regurgitation   . Mobitz II    a. s/p STJ dual chamber PPM   . MVP (mitral valve prolapse)    moderate posterior MVP with moderate MR and grade II diasotlic dysfunction  . PAC (premature atrial contraction)   . PAF (paroxysmal atrial fibrillation) (HCC)     note on pacer check. CHADS2VASC score is 4 now on Eliquis.  [name] PVC's (premature ventricular contractions)   . S/P placement of cardiac pacemaker   . Small vessel disease, cerebrovascular     Past  Surgical History:  Procedure Laterality Date  . ABDOMINAL HYSTERECTOMY    . BREAST BIOPSY Right   . EP IMPLANTABLE DEVICE N/A 01/07/2015   STJ dual chamber pacemaker implanted by Dr [name] for 2:1 heart block  . PERIPHERAL VASCULAR CATHETERIZATION N/A 02/03/2015   Procedure: Upper Extremity Venography;  Surgeon: [name], MD;  [facility] INVASIVE CV LAB;  Service: Cardiovascular;  Laterality: N/A;  . TONSILLECTOMY      Current Medications: Current Meds  Medication Sig  . apixaban (ELIQUIS) 5 MG TABS tablet Take 1 tablet (5 mg total) by mouth 2 (two) times daily.  [name] atenolol (TENORMIN) 50 MG tablet Take 50 mg by mouth daily.  [name] conjugated estrogens  (PREMARIN) vaginal cream Place 1 Applicatorful vaginally daily as needed (moisture).   . fluticasone (FLONASE) 50 MCG/ACT nasal spray Place 2 sprays into both nostrils daily.  . furosemide (LASIX) 40 MG tablet Take 1 tablet (40 mg total) by mouth daily.  . hydrocortisone valerate ointment (WEST-CORT) 0.2 % Apply 1 application topically 2 (two) times daily as needed (itching).   [name] levocetirizine (XYZAL) 5 MG tablet Take 5 mg by mouth every evening.  [name] levothyroxine (SYNTHROID, LEVOTHROID) 75 MCG tablet Take 75 mcg by mouth daily at 6 PM.   . naproxen sodium (ANAPROX) 220 MG tablet Take 220 mg by mouth 2 (two) times daily as needed (pain).  . NON FORMULARY as directed. Allergy injections  . Probiotic Product (PROBIOTIC DAILY PO) Take 1 tablet by mouth daily.   Current Facility-Administered Medications for the 03/13/17 encounter (Office Visit) with Laurann Montana, PA-C  Medication  . 0.9 %  sodium chloride infusion     Allergies:   Pepcid [famotidine]; Allopurinol; Amlodipine; Colchicine; Contrast media [iodinated diagnostic agents]; Prilosec [omeprazole]; Atorvastatin; Augmentin [amoxicillin-pot clavulanate]; Cephalexin; Ciprofloxacin; Erythromycin; Sulfonamide derivatives; and Tetracycline   Social History   Social History  . Marital status: Married    Spouse name: N/A  . Number of children: 2  . Years of education: N/A   Occupational History  . retired    Social History Main Topics  . Smoking status: Former Games developer  . Smokeless tobacco: Never Used  . Alcohol use No  . Drug use: No  . Sexual activity: Not Asked   Other Topics Concern  . None   Social History Narrative   Patient drinks 1-2 cups of caffeine daily.   Patient is right handed.      Family History:  Family History  Problem Relation Age of Onset  . Heart disease Mother   . Stroke Mother   . Hypertension Mother   . Heart disease Father   . Heart attack Father   . Ovarian cancer Sister   . Heart disease Brother    . Rheum arthritis Sister   . Melanoma Brother   . Brain cancer Brother   . Heart attack Brother   . Hypertension Brother   . Hypertension Sister   . Colon cancer Neg Hx      ROS:   Please see the history of present illness. All other systems are reviewed and otherwise negative.    PHYSICAL EXAM:   VS:  BP 128/76   Pulse 75   Ht  (1.676 m)   Wt 140 lb 1.9 oz (63.6 kg)   BMI 22.62 kg/m   BMI: Body mass index is 22.62 kg/m. GEN: Well nourished, well developed well appearing WF, in no acute distress  HEENT: normocephalic, atraumatic Neck: no JVD, carotid  bruits, or masses Cardiac: RRR; no murmurs, rubs, or gallops, no edema  Respiratory:  clear to auscultation bilaterally, normal work of breathing GI: soft, nontender, nondistended, + BS MS: no deformity or atrophy  Skin: warm and dry, no rash Neuro:  Alert and Oriented x 3, Strength and sensation are intact, follows commands Psych: euthymic mood, full affect  Wt Readings from Last 3 Encounters:  03/13/17 140 lb 1.9 oz (63.6 kg)  03/05/17 141 lb 9.6 oz (64.2 kg)  02/19/17 141 lb (64 kg)      Studies/Labs Reviewed:   EKG:  EKG was ordered today and personally reviewed by me and demonstrates demand pacemaker - AV pacing with occasional PVCs similar to prior.  Recent Labs: 03/05/2017: BUN 31; Creatinine, Ser 1.36; Hemoglobin 14.3; NT-Pro BNP 2,335; Platelets 267; Potassium 4.2; Sodium 139   Lipid Panel    Component Value Date/Time   CHOL 153 01/07/2015 0304   TRIG 67 01/07/2015 0304   HDL 51 01/07/2015 0304   CHOLHDL 3.0 01/07/2015 0304   VLDL 13 01/07/2015 0304   LDLCALC 89 01/07/2015 0304    Additional studies/ records that were reviewed today include: Summarized above.   ASSESSMENT & PLAN:   1. Acute CHF due to valvular disease - she reports mild improvement in dyspnea with Lasix. She continues to note DOE which I suspect is due to her valvular disease. Clinically she does not appear markedly volume  overloaded today so would continue Lasix at present dose. She has had some orthostatic-type wooziness so I would be cautious about increasing diuretic further at this time. BMET is pending. Discussed 2g sodium diet, fluid restriction. See below re: MR. 2. Mitral regurgitation - she feels she would be able to lie flat. She appears stable to proceed with TEE at this time. Risks, benefits discussed with patient and she is agreeable. [name] does not have any availability on 9/7. Will arrange 03/18/16 with Dr. [name] - [name] will be a rounder that week so they could potentially discuss the outcome and next steps together. Will also check 2V CXR for completeness as part of workup for dyspnea. 3. PVCs - device interrogation reports 21% PVC burden, increased further from last check. Check TSH and Mg in addition to previously ordered BMET. I have asked device clinic nurse to forward information to patient's electrophysiologist for further input. 4. CKD III - recheck BMET today. 5. Hypercalcemia - pending reassessment with BMET.  6. Paroxysmal atrial fibrillation - continue Eliquis, pending further assessment of severity of MR.  Disposition: F/u with Dr. Eugenie Filler team app in 1 week after TEE.   Medication Adjustments/Labs and Tests Ordered: Current medicines are reviewed at length with the patient today.  Concerns regarding medicines are outlined above. Medication changes, Labs and Tests ordered today are summarized above and listed in the Patient Instructions accessible in Encounters.   Signed, Laurann Montana, PA-C  03/13/2017 11:23 AM    [facility] [address], [place], [place]  [address] Phone: [phone]; Fax: [phone]

## 2017-03-26 NOTE — Brief Op Note (Signed)
.    BRIEF R&LHC   03/26/2017  2:59 PM  PATIENT:  Amanda [name]  76 y.o. female With mitral prolapse and severe mitral regurgitation referred for right left heart catheterization as part of preop evaluation.  PRE-OPERATIVE DIAGNOSIS:  Mitral regurgitation  POST-OPERATIVE DIAGNOSIS:    Angiographically normal coronary arteries with very tortuous vessels  Severe MR by PCWP wave form & 3-4+ MR on LV Gram  RHC:   RAP mean: 7 mmHg; RVP/EDP: 37/4/9 mmHg  PAP/mean 37/12/23 mmHg; PCP mean: 18 mmHg  LVP/EDP: 157/11/24 mmHg; AoP/MAP: 157/65/101 mmHg  FA SaO2%: 95%; PA SaO2% 69%.  FICK CO/CI: 5.19 / 2.98  PROCEDURE:  Procedure(s): RIGHT/LEFT HEART CATH AND CORONARY ANGIOGRAPHY (N/A)   RIGHT RADIAL ACCESS: 6 French sheath with micropuncture Angiocath - Seldinger technique  3500 Units IV Heparin.  5Fr JR4 - RCA Angiography & LV pressures/LVGram, JL3.5 - LCA Angiography  60 mL contrast  RIGHT BRACHIAL AXIS: Existing IV exchanged over a wire for a 5 French glide sheath  5 Fr RHC catheter - RA-RV-PA & PCWP, pressures obtained & PA SaO2% sample Radial sheath - removed in Cath Lab, TR Band 1455 hr, 11 mmHg Brachial sheath - removed in PACU holding area, manual pressure  SURGEON:  Surgeon(s) and Role:    [name], MD - Primary   ANESTHESIA:   local and IV sedation; 25mL Lidocaine, 1 mg Versed, 25 mcg Fentanyl.  EBL:  <50 mL  MEDICATIONS USED:  Radial Cocktail - 3mg  Verapamil in 10 mL NS, 60 mL contrast  TR BAND: 1455 hr, 11 mL air  DICTATION: .Note written in EPIC  PLAN OF CARE: Discharge to home after PACU  -- continue plan for CT Surgical consult  PATIENT DISPOSITION:  PACU - hemodynamically stable.   Delay start of Pharmacological VTE agent (>24hrs) due to surgical blood loss or risk of bleeding: not applicable     Bryan Lemma, M.D., M.S. Interventional Cardiologist   Pager # [phone] Phone # [phone] [address] [place], [place] [address]   .

## 2017-03-26 NOTE — Progress Notes (Signed)
Pt took premeds as ordered for contrast allergy, finishing at 0930 this morning with 10mg  PO Prednisone. Pt also took 50mg  PO Benadryl at 0900.

## 2017-03-26 NOTE — Interval H&P Note (Signed)
History and Physical Interval Note:  03/26/2017 1:56 PM  Amanda [name]  has presented today for surgery, with the diagnosis of mr  The various methods of treatment have been discussed with the patient and family. After consideration of risks, benefits and other options for treatment, the patient has consented to  Procedure(s): RIGHT/LEFT HEART CATH AND CORONARY ANGIOGRAPHY (N/A) as a surgical intervention .  The patient's history has been reviewed, patient examined, no change in status, stable for surgery.  I have reviewed the patient's chart and labs.  Questions were answered to the patient's satisfaction.     Bryan Lemma

## 2017-03-27 ENCOUNTER — Encounter (HOSPITAL_COMMUNITY): Payer: Self-pay | Admitting: Cardiology

## 2017-03-27 LAB — POCT I-STAT 3, ART BLOOD GAS (G3+)
ACID-BASE EXCESS: 1 mmol/L (ref 0.0–2.0)
Bicarbonate: 25 mmol/L (ref 20.0–28.0)
O2 SAT: 95 %
TCO2: 26 mmol/L (ref 22–32)
pCO2 arterial: 36.1 mmHg (ref 32.0–48.0)
pH, Arterial: 7.448 (ref 7.350–7.450)
pO2, Arterial: 70 mmHg — ABNORMAL LOW (ref 83.0–108.0)

## 2017-03-30 ENCOUNTER — Other Ambulatory Visit: Payer: Self-pay | Admitting: Cardiology

## 2017-03-30 DIAGNOSIS — I10 Essential (primary) hypertension: Secondary | ICD-10-CM

## 2017-04-01 ENCOUNTER — Ambulatory Visit (INDEPENDENT_AMBULATORY_CARE_PROVIDER_SITE_OTHER): Payer: Medicare Other | Admitting: Internal Medicine

## 2017-04-01 ENCOUNTER — Encounter: Payer: Self-pay | Admitting: Internal Medicine

## 2017-04-01 VITALS — BP 128/70 | HR 55 | Resp 16 | Ht 67.0 in | Wt 141.4 lb

## 2017-04-01 DIAGNOSIS — I480 Paroxysmal atrial fibrillation: Secondary | ICD-10-CM

## 2017-04-01 DIAGNOSIS — I442 Atrioventricular block, complete: Secondary | ICD-10-CM

## 2017-04-01 DIAGNOSIS — I10 Essential (primary) hypertension: Secondary | ICD-10-CM

## 2017-04-01 LAB — CUP PACEART INCLINIC DEVICE CHECK
Brady Statistic RV Percent Paced: 84 %
Date Time Interrogation Session: 20180924152306
Implantable Lead Implant Date: 20160701
Implantable Lead Implant Date: 20160701
Implantable Lead Location: 753860
Lead Channel Impedance Value: 525 Ohm
Lead Channel Pacing Threshold Pulse Width: 0.4 ms
Lead Channel Pacing Threshold Pulse Width: 0.4 ms
Lead Channel Sensing Intrinsic Amplitude: 5.4 mV
Lead Channel Setting Pacing Amplitude: 1.25 V
Lead Channel Setting Pacing Amplitude: 1.625
Lead Channel Setting Pacing Pulse Width: 0.4 ms
Lead Channel Setting Sensing Sensitivity: 2 mV
MDC IDC LEAD LOCATION: 753859
MDC IDC MSMT BATTERY REMAINING LONGEVITY: 118 mo
MDC IDC MSMT BATTERY VOLTAGE: 3.01 V
MDC IDC MSMT LEADCHNL RA IMPEDANCE VALUE: 437.5 Ohm
MDC IDC MSMT LEADCHNL RA PACING THRESHOLD AMPLITUDE: 0.75 V
MDC IDC MSMT LEADCHNL RA PACING THRESHOLD AMPLITUDE: 0.75 V
MDC IDC MSMT LEADCHNL RA PACING THRESHOLD PULSEWIDTH: 0.4 ms
MDC IDC MSMT LEADCHNL RA SENSING INTR AMPL: 2.8 mV
MDC IDC MSMT LEADCHNL RV PACING THRESHOLD AMPLITUDE: 1 V
MDC IDC MSMT LEADCHNL RV PACING THRESHOLD AMPLITUDE: 1 V
MDC IDC MSMT LEADCHNL RV PACING THRESHOLD PULSEWIDTH: 0.4 ms
MDC IDC PG IMPLANT DT: 20160701
MDC IDC STAT BRADY RA PERCENT PACED: 38 %
Pulse Gen Model: 2240
Pulse Gen Serial Number: 7785935

## 2017-04-01 NOTE — Progress Notes (Signed)
HPI [name] returns today for ongoing evaluation and management of her pacemaker, along with atrial and ventricular arrhythmias, and worsening mitral regurgitation. She is a very pleasant 76 year old woman who developed worsening heart failure symptoms and underwent TEE and left and right heart catheterization. EEG demonstrated moderate mitral valve prolapse and mitral regurgitation with a regurgitant volume of 64 mL. Her ejection fraction was 55-60%. Left and right heart catheterization demonstrated moderate to severe mitral regurgitation and a mean wedge pressure of 18 with a large V wave by right heart catheterization. She had normal coronaries. In addition, interrogation of her pacemaker has demonstrated increased atrial and ventricular ectopy for which she is essentially asymptomatic. Allergies  Allergen Reactions  . Pepcid [Famotidine] Swelling    "Throat Swelling"   . Allopurinol Other (See Comments)    Caused headaches  . Amlodipine Other (See Comments)    PEDAL EDEMA   . Colchicine Other (See Comments)    unknown  . Contrast Media [Iodinated Diagnostic Agents] Hives    IVP dye per patient  . Prilosec [Omeprazole] Nausea Only  . Atorvastatin     unknown  . Augmentin [Amoxicillin-Pot Clavulanate] Itching and Rash    Has patient had a PCN reaction causing immediate rash, facial/tongue/throat swelling, SOB or lightheadedness with hypotension: No Has patient had a PCN reaction causing severe rash involving mucus membranes or skin necrosis: No Has patient had a PCN reaction that required hospitalization: No Has patient had a PCN reaction occurring within the last 10 years: Unknown If all of the above answers are "NO", then may proceed with Cephalosporin use.   . Cephalexin Itching and Rash  . Ciprofloxacin Itching and Rash  . Erythromycin Itching and Rash  . Sulfonamide Derivatives Itching and Rash  . Tetracycline Itching and Rash     Current Outpatient  Prescriptions  Medication Sig Dispense Refill  . apixaban (ELIQUIS) 5 MG TABS tablet Take 1 tablet (5 mg total) by mouth 2 (two) times daily. 60 tablet 11  . atenolol (TENORMIN) 50 MG tablet Take 50 mg by mouth every evening.     . conjugated estrogens (PREMARIN) vaginal cream Place 1 Applicatorful vaginally daily as needed (moisture).     . fluticasone (FLONASE) 50 MCG/ACT nasal spray Place 2 sprays into both nostrils daily.    . furosemide (LASIX) 20 MG tablet Take 1 tablet (20 mg total) by mouth daily. 90 tablet 3  . hydrocortisone valerate ointment (WEST-CORT) 0.2 % Apply 1 application topically 2 (two) times daily as needed (itching).     [name] levocetirizine (XYZAL) 5 MG tablet Take 5 mg by mouth every evening.    [name] levothyroxine (SYNTHROID, LEVOTHROID) 75 MCG tablet Take 75 mcg by mouth daily at 2 am.     . naproxen sodium (ANAPROX) 220 MG tablet Take 220 mg by mouth 2 (two) times daily as needed (pain).    . NON FORMULARY as directed. Allergy injections    . potassium chloride (K-DUR) 10 MEQ tablet Take 1 tablet (10 mEq total) by mouth daily. 30 tablet 2  . Probiotic Product (PROBIOTIC DAILY PO) Take 1 tablet by mouth daily.     Current Facility-Administered Medications  Medication Dose Route Frequency Provider Last Rate Last Dose  . 0.9 %  sodium chloride infusion  500 mL Intravenous Continuous Armbruster, Willaim Rayas, MD         Past Medical History:  Diagnosis Date  . Allergic rhinitis   . Anemia   . CKD (  chronic kidney disease), stage III    stage III  . DJD (degenerative joint disease)   . Glomerulonephritis    Dr [name]  . Hyperlipidemia   . Hyperparathyroidism (HCC)   . Hypertension   . Hypothyroidism   . IBS (irritable bowel syndrome)   . Interstitial cystitis   . Mitral regurgitation   . Mobitz II    a. s/p STJ dual chamber PPM   . MVP (mitral valve prolapse)    moderate posterior MVP with moderate MR and grade II diasotlic dysfunction  . PAC (premature atrial  contraction)   . PAF (paroxysmal atrial fibrillation) (HCC)     note on pacer check. CHADS2VASC score is 4 now on Eliquis.  [name] PVC's (premature ventricular contractions)   . S/P placement of cardiac pacemaker   . Small vessel disease, cerebrovascular     ROS:   All systems reviewed and negative except as noted in the HPI.   Past Surgical History:  Procedure Laterality Date  . ABDOMINAL HYSTERECTOMY    . BREAST BIOPSY Right   . EP IMPLANTABLE DEVICE N/A 01/07/2015   STJ dual chamber pacemaker implanted by Dr [name] for 2:1 heart block  . PERIPHERAL VASCULAR CATHETERIZATION N/A 02/03/2015   Procedure: Upper Extremity Venography;  Surgeon: [name], MD;  Location: [facility] INVASIVE CV LAB;  Service: Cardiovascular;  Laterality: N/A;  . RIGHT/LEFT HEART CATH AND CORONARY ANGIOGRAPHY N/A 03/26/2017   Procedure: RIGHT/LEFT HEART CATH AND CORONARY ANGIOGRAPHY;  Surgeon: [name], MD;  Location: [facility] INVASIVE CV LAB;  Service: Cardiovascular;  Laterality: N/A;  . TEE WITHOUT CARDIOVERSION N/A 03/18/2017   Procedure: TRANSESOPHAGEAL ECHOCARDIOGRAM (TEE);  Surgeon: [name], MD;  Location: [facility] ENDOSCOPY;  Service: Cardiovascular;  Laterality: N/A;  . TONSILLECTOMY       Family History  Problem Relation Age of Onset  . Heart disease Mother   . Stroke Mother   . Hypertension Mother   . Heart disease Father   . Heart attack Father   . Ovarian cancer Sister   . Heart disease Brother   . Rheum arthritis Sister   . Melanoma Brother   . Brain cancer Brother   . Heart attack Brother   . Hypertension Brother   . Hypertension Sister   . Colon cancer Neg Hx      Social History   Social History  . Marital status: Married    Spouse name: N/A  . Number of children: 2  . Years of education: N/A   Occupational History  . retired    Social History Main Topics  . Smoking status: Former Games developer  . Smokeless tobacco: Never Used  . Alcohol use No  . Drug use: No  . Sexual  activity: Not on file   Other Topics Concern  . Not on file   Social History Narrative   Patient drinks 1-2 cups of caffeine daily.   Patient is right handed.      BP 128/70   Pulse (!) 55   Resp 16   Ht  (1.702 m)   Wt 141 lb 6.4 oz (64.1 kg)   SpO2 97%   BMI 22.15 kg/m   Physical Exam:  Well appearing22 year old woman, NAD HEENT: Unremarkable Neck:  6 cm JVD, no thyromegally Lymphatics:  No adenopathy Back:  No CVA tenderness Lungs:  Clear, with no wheezes, rales, or rhonchi. HEART:  Regular rate rhythm, no murmurs, no rubs, no clicks Abd:  soft, positive bowel sounds, no organomegally,  no rebound, no guarding Ext:  2 plus pulses, no edema, no cyanosis, no clubbing Skin:  No rashes no nodules Neuro:  CN II through XII intact, motor grossly intact  EKG - none today  DEVICE  Normal device function.  See PaceArt for details.   Assess/Plan: 1. Intermittent complete heart block - she is asymptomatic status post pacemaker insertion 2. Pacemaker - her St. Jude dual-chamber pacemaker is working normally. No change in pacemaker programming this time. 3. Mitral regurgitation - she appears to have surgical mitral regurg. She is pending a referral to Dr. Cornelius Moras. 4. Paroxysmal atrial fibrillation - the patient's atrial fibrillation has been reasonably well controlled. Interrogation of her pacemaker demonstrates that she is in normal rhythm 99% of the time. Lewayne Bunting, M.D.

## 2017-04-01 NOTE — Patient Instructions (Signed)

## 2017-04-03 ENCOUNTER — Institutional Professional Consult (permissible substitution) (INDEPENDENT_AMBULATORY_CARE_PROVIDER_SITE_OTHER): Payer: Medicare Other | Admitting: Thoracic Surgery (Cardiothoracic Vascular Surgery)

## 2017-04-03 ENCOUNTER — Encounter: Payer: Self-pay | Admitting: Thoracic Surgery (Cardiothoracic Vascular Surgery)

## 2017-04-03 ENCOUNTER — Other Ambulatory Visit: Payer: Self-pay | Admitting: *Deleted

## 2017-04-03 ENCOUNTER — Telehealth: Payer: Self-pay | Admitting: Radiology

## 2017-04-03 VITALS — BP 159/75 | Resp 16 | Ht 66.5 in | Wt 144.0 lb

## 2017-04-03 DIAGNOSIS — I340 Nonrheumatic mitral (valve) insufficiency: Secondary | ICD-10-CM

## 2017-04-03 DIAGNOSIS — I71019 Dissection of thoracic aorta, unspecified: Secondary | ICD-10-CM

## 2017-04-03 DIAGNOSIS — I341 Nonrheumatic mitral (valve) prolapse: Secondary | ICD-10-CM

## 2017-04-03 DIAGNOSIS — I7101 Dissection of thoracic aorta: Secondary | ICD-10-CM

## 2017-04-03 DIAGNOSIS — I480 Paroxysmal atrial fibrillation: Secondary | ICD-10-CM

## 2017-04-03 DIAGNOSIS — I7409 Other arterial embolism and thrombosis of abdominal aorta: Secondary | ICD-10-CM

## 2017-04-03 NOTE — Patient Instructions (Addendum)
   Stop taking Eliquis on Tuesday October 2nd  Continue taking all other medications without change through the day before surgery.  Have nothing to eat or drink after midnight the night before surgery.  On the morning of surgery take only Atenolol and Synthroid with a sip of water.

## 2017-04-03 NOTE — Progress Notes (Signed)
[address]       Amanda [name] [address]             [phone]     CARDIOTHORACIC SURGERY CONSULTATION REPORT  Referring Provider is Laurann Montana, PA-C  Primary Cardiologist is Quintella Reichert, MD Primary Electrophysiologist is Marinus Maw, MD PCP is Amanda Dimitri, MD  Chief Complaint  Patient presents with  . 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 The patient states that she was first noted to have a heart murmur on physical exam more than 30 years ago. For many years she was followed by Dr. Corinda [name] with known history of mitral valve prolapse and more recently she has been followed by Dr. Mayford [name].  She developed symptomatic bradycardia for which she underwent placement of a permanent pacemaker by Dr. Ladona [name] in 2016.  Echocardiogram at that time revealed normal left ventricular systolic function with mild to moderate mitral regurgitation. Follow-up echocardiogram performed in June 2017 revealed preserved left ventricular systolic function but significant worsening in severity of mitral regurgitation. The patient remained asymptomatic at that time. Interrogation of her pacemaker revealed episodes of paroxysmal atrial fibrillation. The patient was started on Eliquis for anticoagulation.  Over the past couple of months the patient has developed significant  progression of symptoms of exertional shortness of breath.  TEE was planned but had to be delayed because of esophageal dysmotility with a lower esophageal ring. She subsequently underwent EGD with esophageal dilatation. TEE performed 03/18/2017 revealed normal left ventricular systolic function with mitral valve prolapse causing severe mitral regurgitation. Patient was referred for elective surgical consultation.  The patient is married and lives with her husband in [place]. 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 She has remained reasonably active and functionally independent during retirement.  Over the last several months she has developed significant symptoms of exertional shortness of breath. She denies any symptoms of resting shortness of breath but she gets short of breath if she lays flat in bed. She has not had any palpitations. She denies any history of chest pain or chest tightness. She has not had dizzy spells or syncope. She has a persistent dry cough. Her mobility remains fairly good although she states that she is limited both by exertional shortness of breath as well as muscle pain in the thighs of both legs with ambulation. She has some degenerative arthritis. She does not require any mechanical support for ambulation and she states that her balance is good.  She has had multiple urinary tract infections in the past including one associated with severe sepsis for which she was hospitalized 2 years ago. She had delirium at that time and apparently underwent an MRI of the brain that revealed small vessel disease. She is currently finishing a prescription of antibiotics for a recent urinary infection. She denies any ongoing dysuria.  Past Medical History:  Diagnosis Date  . Allergic rhinitis   . Anemia   .  CKD (chronic kidney disease), stage III    stage III  . DJD (degenerative joint  disease)   . Glomerulonephritis    Dr [name]  . Hyperlipidemia   . Hyperparathyroidism (HCC)   . Hypertension   . Hypothyroidism   . IBS (irritable bowel syndrome)   . Interstitial cystitis   . Mitral regurgitation   . Mobitz II    a. s/p STJ dual chamber PPM   . MVP (mitral valve prolapse)    moderate posterior MVP with moderate MR and grade II diasotlic dysfunction  . PAC (premature atrial contraction)   . PAF (paroxysmal atrial fibrillation) (HCC)     note on pacer check. CHADS2VASC score is 4 now on Eliquis.  . Paroxysmal atrial fibrillation (HCC)    CHADS2VASC score is 4  . PVC's (premature ventricular contractions)   . S/P placement of cardiac pacemaker   . Small vessel disease, cerebrovascular     Past Surgical History:  Procedure Laterality Date  . ABDOMINAL HYSTERECTOMY    . BREAST BIOPSY Right   . EP IMPLANTABLE DEVICE N/A 01/07/2015   STJ dual chamber pacemaker implanted by Dr Amanda [name] for 2:1 heart block  . PERIPHERAL VASCULAR CATHETERIZATION N/A 02/03/2015   Procedure: Upper Extremity Venography;  Surgeon: [name], MD;  Location: [facility] INVASIVE CV LAB;  Service: Cardiovascular;  Laterality: N/A;  . RIGHT/LEFT HEART CATH AND CORONARY ANGIOGRAPHY N/A 03/26/2017   Procedure: RIGHT/LEFT HEART CATH AND CORONARY ANGIOGRAPHY;  Surgeon: [name], MD;  Location: [facility] INVASIVE CV LAB;  Service: Cardiovascular;  Laterality: N/A;  . TEE WITHOUT CARDIOVERSION N/A 03/18/2017   Procedure: TRANSESOPHAGEAL ECHOCARDIOGRAM (TEE);  Surgeon: [name], MD;  Location: [facility] ENDOSCOPY;  Service: Cardiovascular;  Laterality: N/A;  . TONSILLECTOMY      Family History  Problem Relation Age of Onset  . Heart disease Mother   . Stroke Mother   . Hypertension Mother   . Heart disease Father   . Heart attack Father   . Ovarian cancer Sister   . Heart disease Brother   . Rheum arthritis Sister   . Melanoma Brother   . Brain cancer Brother   . Heart attack Brother   .  Hypertension Brother   . Hypertension Sister   . Colon cancer Neg Hx     Social History   Social History  . Marital status: Married    Spouse name: N/A  . Number of children: 2  . Years of education: N/A   Occupational History  . retired    Social History Main Topics  . Smoking status: Former Games developer  . Smokeless tobacco: Never Used  . Alcohol use No  . Drug use: No  . Sexual activity: Not on file   Other Topics Concern  . Not on file   Social History Narrative   Patient drinks 1-2 cups of caffeine daily.   Patient is right handed.     Current Outpatient Prescriptions  Medication Sig Dispense Refill  . apixaban (ELIQUIS) 5 MG TABS tablet Take 1 tablet (5 mg total) by mouth 2 (two) times daily. 60 tablet 11  . atenolol (TENORMIN) 50 MG tablet Take 1 tablet (50 mg total) by mouth daily. 30 tablet 11  . conjugated estrogens (PREMARIN) vaginal cream Place 1 Applicatorful vaginally daily as needed (moisture).     . fluticasone (FLONASE) 50 MCG/ACT nasal spray Place 2 sprays into both nostrils daily.    . furosemide (LASIX) 20 MG tablet Take 1 tablet (20  mg total) by mouth daily. 90 tablet 3  . hydrocortisone valerate ointment (WEST-CORT) 0.2 % Apply 1 application topically 2 (two) times daily as needed (itching).     Amanda [name] levocetirizine (XYZAL) 5 MG tablet Take 5 mg by mouth every evening.    Amanda [name] levothyroxine (SYNTHROID, LEVOTHROID) 75 MCG tablet Take 75 mcg by mouth daily at 2 am.     . naproxen sodium (ANAPROX) 220 MG tablet Take 220 mg by mouth 2 (two) times daily as needed (pain).    . NON FORMULARY as directed. Allergy injections    . Probiotic Product (PROBIOTIC DAILY PO) Take 1 tablet by mouth daily.     Current Facility-Administered Medications  Medication Dose Route Frequency Provider Last Rate Last Dose  . 0.9 %  sodium chloride infusion  500 mL Intravenous Continuous Armbruster, Willaim Rayas, MD        Allergies  Allergen Reactions  . Pepcid [Famotidine] Swelling     "Throat Swelling"   . Allopurinol Other (See Comments)    Caused headaches  . Amlodipine Other (See Comments)    PEDAL EDEMA   . Colchicine Other (See Comments)    unknown  . Contrast Media [Iodinated Diagnostic Agents] Hives    IVP dye per patient  . Prilosec [Omeprazole] Nausea Only  . Atorvastatin     unknown  . Cephalexin Itching and Rash  . Ciprofloxacin Itching and Rash  . Erythromycin Itching and Rash  . Sulfonamide Derivatives Itching and Rash  . Tetracycline Itching and Rash      Review of Systems:   General:  decreased appetite, decreased energy, + weight gain, no weight loss, no fever  Cardiac:  no chest pain with exertion, no chest pain at rest, + SOB with exertion, no resting SOB, no PND, + orthopnea, no palpitations, no arrhythmia, + atrial fibrillation, no LE edema, no dizzy spells, no syncope  Respiratory:  + shortness of breath, no home oxygen, no productive cough, + dry cough, no bronchitis, no wheezing, no hemoptysis, no asthma, no pain with inspiration or cough, no sleep apnea, no CPAP at night  GI:   no difficulty swallowing, + reflux, no frequent heartburn, no hiatal hernia, no abdominal pain, no constipation, no diarrhea, no hematochezia, no hematemesis, no melena  GU:   no dysuria,  no frequency, + urinary tract infection, no hematuria, no kidney stones, + kidney disease  Vascular:  no pain suggestive of claudication, no pain in feet, no leg cramps, no varicose veins, no DVT, no non-healing foot ulcer  Neuro:   no stroke, + TIA's, no seizures, no headaches, no temporary blindness one eye,  no slurred speech, no peripheral neuropathy, no chronic pain, no instability of gait, no memory/cognitive dysfunction  Musculoskeletal: + arthritis, no joint swelling, + myalgias, no difficulty walking, normal mobility   Skin:   no rash, no itching, no skin infections, no pressure sores or ulcerations  Psych:   no anxiety, no depression, no nervousness, no unusual recent  stress  Eyes:   no blurry vision, no floaters, no recent vision changes, + wears glasses or contacts  ENT:   no hearing loss, no loose or painful teeth, + dentures, last saw dentist July 2018  Hematologic:  + easy bruising, no abnormal bleeding, no clotting disorder, + frequent epistaxis  Endocrine:  no diabetes, does not check CBG's at home     Physical Exam:   BP (!) 159/75 (BP Location: Left Arm, Patient Position: Sitting, Cuff Size: Large)  Resp 16   Ht 5' 6.5" (1.689 m)   Wt 144 lb (65.3 kg)   SpO2 98% Comment: ON RA  BMI 22.89 kg/m   General:  Elderly but well-appearing  HEENT:  Unremarkable   Neck:   no JVD, no bruits, no adenopathy   Chest:   clear to auscultation, symmetrical breath sounds, no wheezes, no rhonchi   CV:   RRR, grade III/VI holosystolic murmur   Abdomen:  soft, non-tender, no masses   Extremities:  warm, well-perfused, pulses diminished but palpable, no LE edema  Rectal/GU  Deferred  Neuro:   Grossly non-focal and symmetrical throughout  Skin:   Clean and dry, no rashes, no breakdown   Diagnostic Tests:  Transthoracic Echocardiography  Patient:    Shelba, Susi MR #:       [id number] Study Date: 12/15/2015 Gender:     F Age:        35 Height:     169.5 cm Weight:     58.9 kg BSA:        1.66 m^2 Pt. Status: Room:   ORDERING     Armanda Magic, MD  REFERRING    Armanda Magic, MD  SONOGRAPHER  Aida Raider, RDCS  ATTENDING    Thurmon Fair, MD  PERFORMING   [facility]  cc:  ------------------------------------------------------------------- LV EF: 60% -   65%  ------------------------------------------------------------------- Indications:      I05.9 Mitral Valve Disorder.  ------------------------------------------------------------------- History:   PMH:  Acquired from the patient and from the patient&'s chart.  PMH:  MVP. Chronic Kidney Disease.  Risk factors: Hypertension.  Dyslipidemia.  ------------------------------------------------------------------- Study Conclusions  - Left ventricle: The cavity size was normal. Wall thickness was   normal. Systolic function was normal. The estimated ejection   fraction was in the range of 60% to 65%. Wall motion was normal;   there were no regional wall motion abnormalities. Features are   consistent with a pseudonormal left ventricular filling pattern,   with concomitant abnormal relaxation and increased filling   pressure (grade 2 diastolic dysfunction). - Ventricular septum: Septal motion showed paradox. These changes   are consistent with right ventricular pacing. - Mitral valve: Calcified annulus. Moderately thickened leaflets .   Moderate myxomatous degeneration. Moderate, holosystolicprolapse,   involving the posterior leaflet. There was moderate to severe   regurgitation directed eccentrically and anteriorly. - Left atrium: The atrium was mildly dilated. - Atrial septum: No defect or patent foramen ovale was identified. - Tricuspid valve: Mild prolapse. There was mild-moderate   regurgitation directed centrally. - Pulmonary arteries: PA peak pressure: 33 mm Hg (S).  ------------------------------------------------------------------- Labs, prior tests, procedures, and surgery: Permanent pacemaker system implantation.  Transthoracic echocardiography.  M-mode, complete 2D, spectral Doppler, and color Doppler.  Birthdate:  Patient birthdate: 02/12/1941.  Age:  Patient is 76 yr old.  Sex:  Gender: female. BMI: 20.5 kg/m^2.  Patient status:  Outpatient.  Study date:  Study date: 12/15/2015. Study time: 09:55 AM.  Location:  [facility]  -------------------------------------------------------------------  ------------------------------------------------------------------- Left ventricle:  The cavity size was normal. Wall thickness was normal. Systolic function was normal. The estimated  ejection fraction was in the range of 60% to 65%. Wall motion was normal; there were no regional wall motion abnormalities. Features are consistent with a pseudonormal left ventricular filling pattern, with concomitant abnormal relaxation and increased filling pressure (grade 2 diastolic dysfunction).  ------------------------------------------------------------------- Aortic valve:   Mildly thickened leaflets. Cusp separation was normal. Sclerosis without stenosis.  Doppler:  Transvalvular velocity was within the normal range. There was no stenosis. There was no regurgitation.  ------------------------------------------------------------------- Aorta:  Aortic root: The aortic root was normal in size. Ascending aorta: The ascending aorta was normal in size. Aortic arch: The aortic arch had moderate diffuse disease.  ------------------------------------------------------------------- Mitral valve:   Calcified annulus. Moderately thickened leaflets . Moderate myxomatous degeneration.  Moderate, holosystolicprolapse, involving the posterior leaflet.  Doppler:   There was no evidence for stenosis.   There was moderate to severe regurgitation directed eccentrically and anteriorly.    Peak gradient (D): 5 mm Hg.  ------------------------------------------------------------------- Left atrium:  The atrium was mildly dilated.  ------------------------------------------------------------------- Atrial septum:  No defect or patent foramen ovale was identified.   ------------------------------------------------------------------- Right ventricle:  The cavity size was normal. Pacer wire or catheter noted in right ventricle. Systolic function was normal.   ------------------------------------------------------------------- Ventricular septum:   Septal motion showed paradox. These changes are consistent with right ventricular  pacing.  ------------------------------------------------------------------- Pulmonic valve:   Poorly visualized.  The valve appears to be grossly normal.  ------------------------------------------------------------------- Tricuspid valve:  Leaflet separation was normal.  Mild prolapse. Doppler:  Transvalvular velocity was within the normal range. There was mild-moderate regurgitation directed centrally.  ------------------------------------------------------------------- Pulmonary artery:   Systolic pressure was at the upper limits of normal.  ------------------------------------------------------------------- Right atrium:  The atrium was normal in size. Pacer wire or catheter noted in right atrium.  ------------------------------------------------------------------- Pericardium:  There was no pericardial effusion.  ------------------------------------------------------------------- Measurements   Left ventricle                         Value        Reference  LV ID, ED, PLAX chordal                48.46 mm     43 - 52  LV ID, ES, PLAX chordal        (L)     22.66 mm     23 - 38  LV fx shortening, PLAX chordal         49    %      >=29  LV PW thickness, ED                    11.2  mm     ---------  IVS/LV PW ratio, ED                    1.12         <=1.3  Stroke volume, 2D                      61    ml     ---------  Stroke volume/bsa, 2D                  37    ml/m^2 ---------  LV e&', lateral                         7.68  cm/s   ---------  LV E/e&', lateral                       15.23        ---------  LV e&', medial  8.33  cm/s   ---------  LV E/e&', medial                        14.05        ---------  LV e&', average                         8.01  cm/s   ---------  LV E/e&', average                       14.62        ---------    Ventricular septum                     Value        Reference  IVS thickness, ED                      12.5  mm      ---------    LVOT                                   Value        Reference  LVOT ID, S                             21    mm     ---------  LVOT area                              3.46  cm^2   ---------  LVOT ID                                21    mm     ---------  LVOT peak velocity, S                  67.7  cm/s   ---------  LVOT mean velocity, S                  49.4  cm/s   ---------  LVOT VTI, S                            17.7  cm     ---------  LVOT peak gradient, S                  2     mm Hg  ---------  Stroke volume (SV), LVOT DP            61.3  ml     ---------  Stroke index (SV/bsa), LVOT DP         36.9  ml/m^2 ---------    Aorta                                  Value        Reference  Aortic root ID, ED                     34    mm     ---------  Ascending aorta ID, A-P, S             34    mm     ---------    Left atrium                            Value        Reference  LA ID, A-P, ES                         38    mm     ---------  LA ID/bsa, A-P                 (H)     2.29  cm/m^2 <=2.2  LA volume, S                           56    ml     ---------  LA volume/bsa, S                       33.7  ml/m^2 ---------  LA volume, ES, 1-p A4C                 44    ml     ---------  LA volume/bsa, ES, 1-p A4C             26.5  ml/m^2 ---------  LA volume, ES, 1-p A2C                 66    ml     ---------  LA volume/bsa, ES, 1-p A2C             39.7  ml/m^2 ---------    Mitral valve                           Value        Reference  Mitral E-wave peak velocity            117   cm/s   ---------  Mitral A-wave peak velocity            89.4  cm/s   ---------  Mitral deceleration time       (H)     261   ms     150 - 230  Mitral peak gradient, D                5     mm Hg  ---------  Mitral E/A ratio, peak                 1.3          ---------    Pulmonary arteries                     Value        Reference  PA pressure, S, DP             (H)     33    mm Hg  <=30     Tricuspid valve                        Value        Reference  Tricuspid regurg peak velocity  274   cm/s   ---------  Tricuspid peak RV-RA gradient          30    mm Hg  ---------    Systemic veins                         Value        Reference  Estimated CVP                          3     mm Hg  ---------    Right ventricle                        Value        Reference  RV ID, minor axis, ED, A4C             35.6  mm     26 - 43  RV pressure, S, DP             (H)     33    mm Hg  <=30  RV s&', lateral, S                      13.5  cm/s   ---------  Legend: (L)  and  (H)  mark values outside specified reference range.  ------------------------------------------------------------------- Prepared and Electronically Authenticated by  Thurmon Fair, MD 2017-06-08T10:56:41   Transthoracic Echocardiography  Patient:    Jamesetta, Greenhalgh MR #:       [id number] Study Date: 12/27/2016 Gender:     F Age:        48 Height:     171.5 cm Weight:     62.1 kg BSA:        1.72 m^2 Pt. Status: Room:   ORDERING     Armanda Magic, MD  REFERRING    Armanda Magic, MD  ATTENDING    Marca Ancona, M.D.  SONOGRAPHER  Dewitt Hoes, RDCS  PERFORMING   [facility]  cc:  ------------------------------------------------------------------- LV EF: 55% -   60%  ------------------------------------------------------------------- Indications:      Mitral regurgitation (I34.0).  ------------------------------------------------------------------- History:   PMH:   Mitral valve prolapse.  Risk factors:  PAC. RBBB. AV block. Former tobacco use. Hypertension.  ------------------------------------------------------------------- Study Conclusions  - Left ventricle: The cavity size was normal. Wall thickness was   normal. Systolic function was normal. The estimated ejection   fraction was in the range of 55% to 60%. Wall motion was normal;   there were no regional wall motion  abnormalities. Features are   consistent with a pseudonormal left ventricular filling pattern,   with concomitant abnormal relaxation and increased filling   pressure (grade 2 diastolic dysfunction). - Aortic valve: There was no stenosis. - Aorta: Borderline dilated aortic root. Aortic root dimension: 37   mm (ED). - Mitral valve: Mildly calcified annulus. Severe prolapse of the   posterior mitral valve leaflet with eccentric,   anteriorly-directed mitral regurgitation. Mitral regurgitation   looked moderate on this study but may not have been fully   visualized. - Right ventricle: The cavity size was normal. Pacer wire or   catheter noted in right ventricle. Systolic function was normal. - Tricuspid valve: There was mild-moderate regurgitation. - Pulmonary arteries: PA peak pressure: 28 mm Hg (S). - Inferior vena cava: The vessel was normal in size. The  respirophasic diameter changes were in the normal range (>= 50%),   consistent with normal central venous pressure.  Impressions:  - Normal LV size with EF 60-65%. Moderate diastolic dysfunction.   Normal RV size and systolic function. Severe prolapse of the   posterior mitral valve leaflet wtih eccentric,   anteriorly-directed mitral regurgitation. The mitral   regurgitation appeared moderate on this study but may not be   fully visualized. Would consider TEE if she has symptoms   concerning for severe MR.  ------------------------------------------------------------------- Labs, prior tests, procedures, and surgery: Transthoracic echocardiography (07/11/2016).    The mitral valve showed severe regurgitation.  EF was 55%.  ------------------------------------------------------------------- Study data:  Comparison was made to the study of 07/11/2016.  Study status:  Routine.  Procedure:  The patient reported no pain pre or post test. Transthoracic echocardiography. Image quality was adequate.  Study completion:  There  were no complications. Transthoracic echocardiography.  M-mode, complete 2D, spectral Doppler, and color Doppler.  Birthdate:  Patient birthdate: Sep 20, 1940.  Age:  Patient is 76 yr old.  Sex:  Gender: female. BMI: 21.1 kg/m^2.  Blood pressure:     128/50  Patient status: Outpatient.  Study date:  Study date: 12/27/2016. Study time: 10:17 AM.  Location:  [facility]  -------------------------------------------------------------------  ------------------------------------------------------------------- Left ventricle:  The cavity size was normal. Wall thickness was normal. Systolic function was normal. The estimated ejection fraction was in the range of 55% to 60%. Wall motion was normal; there were no regional wall motion abnormalities. Features are consistent with a pseudonormal left ventricular filling pattern, with concomitant abnormal relaxation and increased filling pressure (grade 2 diastolic dysfunction).  ------------------------------------------------------------------- Aortic valve:   Trileaflet.  Doppler:   There was no stenosis. There was no regurgitation.  ------------------------------------------------------------------- Aorta:  Borderline dilated aortic root.  ------------------------------------------------------------------- Mitral valve:   Mildly calcified annulus.  Doppler:   There was no evidence for stenosis.   Severe prolapse of the posterior mitral valve leaflet with eccentric, anteriorly-directed mitral regurgitation. Mitral regurgitation looked moderate on this study but may not have been fully visualized.    Valve area by pressure half-time: 4.31 cm^2. Indexed valve area by pressure half-time: 2.51 cm^2/m^2.    Peak gradient (D): 4 mm Hg.  ------------------------------------------------------------------- Left atrium:  The atrium was normal in size.  ------------------------------------------------------------------- Right ventricle:   The cavity size was normal. Pacer wire or catheter noted in right ventricle. Systolic function was normal.   ------------------------------------------------------------------- Pulmonic valve:    Structurally normal valve.   Cusp separation was normal.  Doppler:  Transvalvular velocity was within the normal range. There was trivial regurgitation.  ------------------------------------------------------------------- Tricuspid valve:   Doppler:  There was mild-moderate regurgitation.   ------------------------------------------------------------------- Right atrium:  The atrium was normal in size.  ------------------------------------------------------------------- Pericardium:  There was no pericardial effusion.  ------------------------------------------------------------------- Systemic veins: Inferior vena cava: The vessel was normal in size. The respirophasic diameter changes were in the normal range (>= 50%), consistent with normal central venous pressure.  ------------------------------------------------------------------- Post procedure conclusions Ascending Aorta:  - Borderline dilated aortic root.  ------------------------------------------------------------------- Measurements   Left ventricle                         Value          Reference  LV ID, ED, PLAX chordal        (L)     41    mm       43 -  52  LV ID, ES, PLAX chordal                27    mm       23 - 38  LV fx shortening, PLAX chordal         34    %        >=29  LV PW thickness, ED                    11    mm       ---------  IVS/LV PW ratio, ED                    0.91           <=1.3  Stroke volume, 2D                      56    ml       ---------  Stroke volume/bsa, 2D                  33    ml/m^2   ---------  LV e&', lateral                         8.44  cm/s     ---------  LV E/e&', lateral                       11.43          ---------  LV e&', medial                          4.95  cm/s      ---------  LV E/e&', medial                        19.49          ---------  LV e&', average                         6.7   cm/s     ---------  LV E/e&', average                       14.41          ---------    Ventricular septum                     Value          Reference  IVS thickness, ED                      10    mm       ---------    LVOT                                   Value          Reference  LVOT ID, S                             21    mm       ---------  LVOT area  3.46  cm^2     ---------  LVOT peak velocity, S                  79.1  cm/s     ---------  LVOT mean velocity, S                  43.6  cm/s     ---------  LVOT VTI, S                            16.2  cm       ---------    Aorta                                  Value          Reference  Aortic root ID, ED                     37    mm       ---------    Left atrium                            Value          Reference  LA ID, A-P, ES                         35    mm       ---------  LA ID/bsa, A-P                         2.04  cm/m^2   <=2.2  LA volume, S                           41.1  ml       ---------  LA volume/bsa, S                       23.9  ml/m^2   ---------  LA volume, ES, 1-p A4C                 44.6  ml       ---------  LA volume/bsa, ES, 1-p A4C             26    ml/m^2   ---------  LA volume, ES, 1-p A2C                 35.7  ml       ---------  LA volume/bsa, ES, 1-p A2C             20.8  ml/m^2   ---------    Mitral valve                           Value          Reference  Mitral E-wave peak velocity            96.5  cm/s     ---------  Mitral A-wave peak velocity            80    cm/s     ---------  Mitral deceleration time  173   ms       150 - 230  Mitral pressure half-time              51    ms       ---------  Mitral peak gradient, D                4     mm Hg    ---------  Mitral E/A ratio, peak                 1.2            ---------  Mitral  valve area, PHT, DP             4.31  cm^2     ---------  Mitral valve area/bsa, PHT, DP         2.51  cm^2/m^2 ---------  Mitral regurg VTI, PISA                176   cm       ---------    Pulmonary arteries                     Value          Reference  PA pressure, S, DP                     28    mm Hg    <=30    Systemic veins                         Value          Reference  Estimated CVP                          3     mm Hg    ---------    Right ventricle                        Value          Reference  TAPSE                                  22    mm       ---------  RV s&', lateral, S                      9.73  cm/s     ---------  Legend: (L)  and  (H)  mark values outside specified reference range.  ------------------------------------------------------------------- Prepared and Electronically Authenticated by  Marca Ancona, M.D. 2018-06-21T16:58:51   Transesophageal Echocardiography  Patient:    Aarti, Mankowski MR #:       [id number] Study Date: 03/18/2017 Gender:     F Age:        85 Height:     167.6 cm Weight:     63.5 kg BSA:        1.72 m^2 Pt. Status: Room:   Debria Garret, Lacey Jensen  SONOGRAPHER  Leta Jungling, RDCS  ADMITTING    [name], MD  ATTENDING    [name], MD  PERFORMING   [name], MD  cc:  ------------------------------------------------------------------- LV EF: 55% -  60%  ------------------------------------------------------------------- Indications:      Mitral regurgitation 424.0.  ------------------------------------------------------------------- History:   PMH:  second Degree AV Block/ Lower Extremity Edema. Dyspnea.  Atrial fibrillation.  Mitral valve disease.  Risk factors:  Hypertension.  ------------------------------------------------------------------- Study Conclusions  - Left ventricle: Systolic function was normal. The estimated   ejection  fraction was in the range of 55% to 60%. Wall motion was   normal; there were no regional wall motion abnormalities. - Aortic valve: There was no significant regurgitation. - Mitral valve: Moderate prolapse, involving the middle scallop of   the posterior leaflet. Regurgitant volume (PISA): 64 ml. - Left atrium: No evidence of thrombus in the atrial cavity or   appendage. No evidence of thrombus in the atrial cavity or   appendage. - Right ventricle: The cavity size was normal. Wall thickness was   normal. Systolic function was normal. - Right atrium: No evidence of thrombus in the atrial cavity or   appendage. - Atrial septum: There appeared to be a small patent foramen ovale   by color flow Doppler. However, the saline microcavitation study   was negative both at rest and with abdominal pressure, indicating   that there is no right to left flow. - Tricuspid valve: There was no significant regurgitation.  ------------------------------------------------------------------- Labs, prior tests, procedures, and surgery: Permanent pacemaker system implantation.  ------------------------------------------------------------------- Study data:   Study status:  Routine.  Consent:  The risks, benefits, and alternatives to the procedure were explained to the patient and informed consent was obtained.  Procedure:  The patient reported no pain pre or post test. Initial setup. The patient was brought to the laboratory. Surface ECG leads were monitored. Sedation. Conscious sedation was administered. Transesophageal echocardiography. A transesophageal probe was inserted by the attending cardiologistwithout difficulty. Image quality was adequate.  Study completion:  The patient tolerated the procedure well. There were no complications.  Administered medications: Fentanyl, .  Midazolam, 4mg .          Diagnostic transesophageal echocardiography.  2D and color Doppler. Birthdate:  Patient  birthdate: 1941/05/14.  Age:  Patient is 76 yr old.  Sex:  Gender: female.    BMI: 22.6 kg/m^2.  Blood pressure:   128/76  Patient status:  Inpatient.  Study date:  Study date: 03/18/2017. Study time: 08:41 AM.  Location:  Endoscopy.  -------------------------------------------------------------------  ------------------------------------------------------------------- Left ventricle:  Systolic function was normal. The estimated ejection fraction was in the range of 55% to 60%. Wall motion was normal; there were no regional wall motion abnormalities.  ------------------------------------------------------------------- Aortic valve:   Structurally normal valve. Trileaflet; normal thickness leaflets. Cusp separation was normal.  Doppler:  There was no significant regurgitation.  ------------------------------------------------------------------- Aorta:  There was mild atheromatous plaque extending to the descending aorta. There was no evidence for dissection. Aortic root: The aortic root was not dilated. Ascending aorta: The ascending aorta was normal in size. Aortic arch: The aortic arch was normal in size. Descending aorta: The descending aorta was normal in size.  ------------------------------------------------------------------- Mitral valve:  Leaflet separation was normal.  Moderate prolapse, involving the middle scallop of the posterior leaflet.  Doppler: There was regurgitation, with multiple jets directed centrally and anteriorly.  ------------------------------------------------------------------- Left atrium:  The atrium was normal in size.  No evidence of thrombus in the atrial cavity or appendage.  No evidence of thrombus in the atrial cavity or appendage. The appendage was morphologically a left appendage, multilobulated, and of normal size. Emptying velocity was normal.  ------------------------------------------------------------------- Atrial septum:  There appeared to be a small patent foramen ovale by color flow Doppler. However, the saline microcavitation study was negative both at rest and with abdominal pressure, indicating that there is no right to left flow.  ------------------------------------------------------------------- Right ventricle:  The cavity size was normal. Wall thickness was normal. Pacer wire or catheter noted in right ventricle. Systolic function was normal.  ------------------------------------------------------------------- Pulmonic valve:    Structurally normal valve.  ------------------------------------------------------------------- Tricuspid valve:   Structurally normal valve.   Leaflet separation was normal.  Doppler:  There was no significant regurgitation.  ------------------------------------------------------------------- Pulmonary artery:   The main pulmonary artery was normal-sized.  ------------------------------------------------------------------- Right atrium:  The atrium was normal in size.  No evidence of thrombus in the atrial cavity or appendage. The appendage was morphologically a right appendage.  ------------------------------------------------------------------- Pericardium:  There was no pericardial effusion.  ------------------------------------------------------------------- Measurements   Left ventricle                            Value  Stroke volume, 2D                         57     ml  Stroke volume/bsa, 2D                     33     ml/m^2    LVOT                                      Value  LVOT ID, S                                22     mm  LVOT area                                 3.8    cm^2  LVOT peak velocity, S                     77.1   cm/s  LVOT mean velocity, S                     44.3   cm/s  LVOT VTI, S                               14.9   cm  Stroke volume (SV), LVOT DP               56.6   ml  Stroke index (SV/bsa), LVOT DP            32.9    ml/m^2    Mitral valve                              Value  Mitral annulus diameter, A-P              25.2   mm  Mitral maximal inflow velocity, PISA      706.62 cm/s  Mitral annulus VTI, D  223.6  cm  Mitral transvalvular flow, D              1698.6 ml  Mitral maximal regurg velocity, PISA      711.26 cm/s  Mitral regurg VTI, PISA                   223.89 cm  Mitral ERO, PISA                          0.2    cm^2  Mitral regurg volume, PISA                64     ml  Mitral regurg fraction, PISA              53.05  %  Legend: (L)  and  (H)  mark values outside specified reference range.  ------------------------------------------------------------------- Prepared and Electronically Authenticated by  [name], MD 2018-09-10T22:12:58   RIGHT/LEFT HEART CATH AND CORONARY ANGIOGRAPHY  Conclusion     The left ventricular systolic function is normal.  Essentially normal right heart cath numbers with mildly elevated LVEDP and PCWP. Please tell if he has a very large V wave however consistent with severe MR.  The left ventricular ejection fraction is 55-65% by visual estimate.  There is moderate-severe (4+) mitral regurgitation.  Angiographically normal coronary arteries, but significant tortuosity   Angiographic normal coronary arteries with moderate-severe mitral regurgitation. Large V wave on PCWP.   Plan: Standard TR band removal per protocol. She'll be discharged home after bed rest and follow-up with Dr. Mayford [name].   Bryan Lemma, MD Bryan Lemma, M.D., M.S. Interventional Cardiologist   Pager # [phone] Phone # [phone] [address] [place], [place] [address]    Indications   Severe mitral regurgitation [I34.0 (ICD-10-CM)]  Procedural Details/Technique   Technical Details PCP: Amanda Dimitri, MD CARDIOLOGIST: Armanda Magic, MD  76 year old woman with history of mitral prolapse and severe mitral  regurgitation referred for right left heart catheterization as part of preop evaluation.  RIGHT/LEFT HEART CATH AND CORONARY ANGIOGRAPHY (N/A)   RIGHT RADIAL ACCESS: 6 French sheath with micropuncture Angiocath - Seldinger technique ? 3500 Units IV Heparin. ? 5Fr JR4 - RCA Angiography & LV pressures/LVGram, JL3.5 - LCA Angiography ? 60 mL contrast  RIGHT BRACHIAL AXIS: Existing IV exchanged over a wire for a 5 [place] glide sheath ? 5 Fr RHC catheter - RA-RV-PA & PCWP, pressures obtained & PA SaO2% sample Radial sheath - removed in Cath Lab, TR Band 1455 hr, 11 mmHg Brachial sheath - removed in PACU holding area, manual pressure  MEDICATIONS USED: Radial Cocktail - 3mg  Verapamil in 10 mL NS, 60 mL contrast   Estimated blood loss <50 mL.  During this procedure the patient was administered the following to achieve and maintain moderate conscious sedation: Versed mg, Fentanyl mcg, while the patient's heart rate, blood pressure, and oxygen saturation were continuously monitored.    Complications   Complications documented before study signed (03/27/2017 1:29 PM EDT)    No complications were associated with this study.  Documented by [name], MD - 03/27/2017 1:27 PM EDT    Coronary Findings   Dominance: Right  Left Main  Vessel was injected. Vessel is normal in caliber. Vessel is angiographically normal.  Left Anterior Descending  Vessel was injected. Vessel is normal in caliber. Vessel is angiographically normal.  Ramus Intermedius  Vessel was injected. Vessel is normal in  caliber. Vessel is angiographically normal.  Left Circumflex  Vessel was injected. Vessel is normal in caliber. Vessel is angiographically normal.  Right Coronary Artery  Vessel was injected. Vessel is normal in caliber. Vessel is angiographically normal.  Right Heart   Right Heart Pressures Borderline P HTN: PAP/mean 37/12/23 mmHg;  PCP mean: 18 mmHg - Large peaked V wave Elevated LV EDP consistent  with volume overload. LVP/EDP: 157/11/24 mmHg; AoP/MAP: 157/65/101 mmHg FA SaO2%: 95%; PA SaO2% 69%. FICK CO/CI: 5.19 / 2.98    Right Atrium Right atrial pressure is normal. RAP mean: 7 mmH    Right Ventricle RVP/EDP: 37/4/9 mmHg    Wall Motion              Left Heart   Left Ventricle The left ventricular size is normal. The left ventricular systolic function is normal. LV end diastolic pressure is normal. The left ventricular ejection fraction is 55-65% by visual estimate. No regional wall motion abnormalities.    Mitral Valve There is severe (4+) mitral regurgitation. The annulus is calcified.    Aortic Valve There is no aortic valve stenosis. There is normal aortic valve motion.    Coronary Diagrams   Diagnostic Diagram       Implants     No implant documentation for this case.  PACS Images   Show images for CARDIAC CATHETERIZATION   Link to Procedure Log   Procedure Log    Hemo Data    Most Recent Value  RA A Wave 11 mmHg  RA V Wave 9 mmHg  RA Mean 7 mmHg  RV Systolic Pressure 37 mmHg  RV Diastolic Pressure 4 mmHg  RV EDP 9 mmHg  PA Systolic Pressure 37 mmHg  PA Diastolic Pressure 12 mmHg  PA Mean 23 mmHg  PW A Wave 20 mmHg  PW V Wave 34 mmHg  PW Mean 18 mmHg  AO Systolic Pressure 163 mmHg  AO Diastolic Pressure 70 mmHg  AO Mean 106 mmHg  LV Systolic Pressure 143 mmHg  LV Diastolic Pressure 9 mmHg  LV EDP 16 mmHg  Arterial Occlusion Pressure Extended Systolic Pressure 165 mmHg  Arterial Occlusion Pressure Extended Diastolic Pressure 68 mmHg  Arterial Occlusion Pressure Extended Mean Pressure 107 mmHg  Left Ventricular Apex Extended Systolic Pressure 157 mmHg  Left Ventricular Apex Extended Diastolic Pressure 11 mmHg  Left Ventricular Apex Extended EDP Pressure 24 mmHg      Impression:  Patient has mitral valve prolapse with stage D severe symptomatic primary mitral regurgitation. I have personally reviewed the patient's recent transthoracic  and transesophageal echocardiograms and diagnostic cardiac catheterization. 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 There is no obvious ruptured chordae tendineae or flail segments, but there are 2 severe jets of regurgitation coursed around the severe prolapsing segment. Left ventricular systolic function remains preserved.  There is left atrial enlargement. Diagnostic cardiac catheterization is notable for the absence of significant coronary artery disease.  Right heart catheterization revealed mildly elevated pulmonary artery pressures but there were large V waves on wedge tracings consistent with severe mitral regurgitation. I agree the patient would benefit from elective mitral valve repair. She might benefit from concomitant maze procedure. Risks associated with surgery should be acceptably low although mildly elevated because of the patient's age and other medical problems. She may be a reasonably good candidate for minimally invasive approach for surgery.   Plan:  The patient and her husband  counseled at length regarding the indications, risks and potential benefits of mitral valve repair.  The rationale for elective surgery has been explained, including a comparison between surgery and continued medical therapy with close follow-up.  The likelihood of successful and durable valve repair has been discussed with particular reference to the findings of their recent echocardiogram.  Based upon these findings and previous experience, I have quoted them a greater than 90 percent likelihood of successful valve repair.  The patient understands and accepts all potential risks of surgery including but not limited to risk of death, stroke or other neurologic complication, myocardial infarction, congestive heart failure, respiratory failure, renal failure, bleeding requiring transfusion and/or reexploration,  arrhythmia, infection or other wound complications, pneumonia, pleural and/or pericardial effusion, pulmonary embolus, aortic dissection or other major vascular complication, or delayed complications related to valve repair or replacement including but not limited to structural valve deterioration and failure, thrombosis, embolization, endocarditis, or paravalvular leak.  Alternative surgical approaches have been discussed including a comparison between conventional sternotomy and minimally-invasive techniques.  The relative risks and benefits of each have been reviewed as they pertain to the patient's specific circumstances, and all of their questions have been addressed.  Specific risks potentially related to the minimally-invasive approach were discussed at length, including but not limited to risk of conversion to full or partial sternotomy, aortic dissection or other major vascular complication, unilateral acute lung injury or pulmonary edema, phrenic nerve dysfunction or paralysis, rib fracture, chronic pain, lung hernia, or lymphocele.  All of their questions have been answered.  The patient hopes to proceed with surgery as soon as practical.  We tentatively plan to proceed with minimally invasive mitral valve repair and Maze procedure on Tuesday, 04/16/2017. Prior to that the patient will undergo CT angiography to evaluate the feasibility of peripheral arterial cannulation for surgery. The patient has been instructed to stop taking Eliquis 7 days prior to surgery.  She will return to our office for follow-up on 04/15/2017 prior to surgery.   I spent in excess of 90 minutes during the conduct of this office consultation and >50% of this time involved direct face-to-face encounter with the patient for counseling and/or coordination of their care.   Salvatore Decent. Cornelius Moras, MD 04/03/2017 11:16 AM

## 2017-04-03 NOTE — Telephone Encounter (Signed)
Called 13 hour prep to [facility]. Called pt and lmom with my number if she has any questions

## 2017-04-04 ENCOUNTER — Encounter: Payer: Medicare Other | Admitting: Thoracic Surgery (Cardiothoracic Vascular Surgery)

## 2017-04-08 ENCOUNTER — Ambulatory Visit
Admission: RE | Admit: 2017-04-08 | Discharge: 2017-04-08 | Disposition: A | Discharge status: Home / Self Care | Payer: Medicare Other | Source: Ambulatory Visit | Attending: Thoracic Surgery (Cardiothoracic Vascular Surgery) | Admitting: Thoracic Surgery (Cardiothoracic Vascular Surgery)

## 2017-04-08 DIAGNOSIS — I7409 Other arterial embolism and thrombosis of abdominal aorta: Secondary | ICD-10-CM

## 2017-04-08 DIAGNOSIS — I7101 Dissection of thoracic aorta: Secondary | ICD-10-CM

## 2017-04-08 DIAGNOSIS — I71019 Dissection of thoracic aorta, unspecified: Secondary | ICD-10-CM

## 2017-04-08 MED ORDER — IOPAMIDOL (ISOVUE-370) INJECTION 76%
75.0000 mL | Freq: Once | INTRAVENOUS | Status: AC | PRN
  Administered 2017-04-08: 75 mL via INTRAVENOUS

## 2017-04-11 NOTE — Progress Notes (Addendum)
PCP: [name], MD  Cardiologist: Carolanne Grumbling, MD  EKG:  03/13/17 and today in [facility]  Stress test: 12/16/2013 in [facility]  ECHO: 04/03/17 in [facility]  Cardiac Cath: 03/26/17 in [facility]   Chest x-ray: 03/13/17 in [facility] and today  Patient stopped Eliquis 04/09/17 (7 day prior to surgery) per surgeon's instructions

## 2017-04-11 NOTE — Pre-Procedure Instructions (Signed)
Amanda [name]  04/11/2017      [facility] - [place], [place] - [address] [place] [address] Phone: [phone] Fax: [phone]    Your procedure is scheduled on April 16, 2017. Report to [facility] Admitting at 530 AM. Call this number if you have problems the morning of surgery:[phone]   Remember:  Do not eat food or drink liquids after midnight.  Take these medicines the morning of surgery with A SIP OF WATER atenolol (tenormin), fluticasone (flonase) nasal spray, levothyroxine (synthroid).  Stop Eliquis as instructed by your surgeon.   7 days prior to surgery STOP taking any Aspirin (unless otherwise instructed by your surgeon), Aleve, Naproxen, Ibuprofen, Motrin, Advil, Goody's, BC's, all herbal medications, fish oil, and all vitamins  Continue all other medications as instructed by your physician except follow the above medication instructions before surgery   Do not wear jewelry, make-up or nail polish.  Do not wear lotions, powders, or perfumes, or deoderant.  Do not shave 48 hours prior to surgery.    Do not bring valuables to the hospital.  [facility] is not responsible for any belongings or valuables.  Contacts, dentures or bridgework may not be worn into surgery.  Leave your suitcase in the car.  After surgery it may be brought to your room.  For patients admitted to the hospital, discharge time will be determined by your treatment team.  Patients discharged the day of surgery will not be allowed to drive home.   Special instructions:  [name]- Preparing For Surgery  Before surgery, you can play an important role. Because skin is not sterile, your skin needs to be as free of germs as possible. You can reduce the number of germs on your skin by washing with CHG (chlorahexidine gluconate) Soap before surgery.  CHG is an antiseptic cleaner which kills germs and bonds with the skin to continue killing germs  even after washing.  Please do not use if you have an allergy to CHG or antibacterial soaps. If your skin becomes reddened/irritated stop using the CHG.  Do not shave (including legs and underarms) for at least 48 hours prior to first CHG shower. It is OK to shave your face.  Please follow these instructions carefully.   1. Shower the NIGHT BEFORE SURGERY and the MORNING OF SURGERY with CHG.   2. If you chose to wash your hair, wash your hair first as usual with your normal shampoo.  3. After you shampoo, rinse your hair and body thoroughly to remove the shampoo.  4. Use CHG as you would any other liquid soap. You can apply CHG directly to the skin and wash gently with a scrungie or a clean washcloth.   5. Apply the CHG Soap to your body ONLY FROM THE NECK DOWN.  Do not use on open wounds or open sores. Avoid contact with your eyes, ears, mouth and genitals (private parts). Wash genitals (private parts) with your normal soap.  USE REGULAR SHAMPOO AND CONDITIONER FOR HAIR USE REGULAR SOAP FOR FACE AND PRIVATE AREA  6. Wash thoroughly, paying special attention to the area where your surgery will be performed.  7. Thoroughly rinse your body with warm water from the neck down.  8. DO NOT shower/wash with your normal soap after using and rinsing off the CHG Soap.  9. Pat yourself dry with a CLEAN TOWEL and WASH CLOTH  10. Wear CLEAN PAJAMAS to bed  the night before surgery, wear comfortable clothes the morning of surgery  11. Place CLEAN SHEETS on your bed the night of your first shower and DO NOT SLEEP WITH PETS.    Day of Surgery: Do not apply any deodorants/lotions. Please wear clean clothes to the hospital/surgery center.     Please read over the following fact sheets that you were given. Pain Booklet, Coughing and Deep Breathing, MRSA Information and Surgical Site Infection Prevention

## 2017-04-12 ENCOUNTER — Ambulatory Visit (HOSPITAL_COMMUNITY)
Admission: RE | Admit: 2017-04-12 | Discharge: 2017-04-12 | Disposition: A | Discharge status: Home / Self Care | Payer: Medicare Other | Source: Ambulatory Visit | Attending: Thoracic Surgery (Cardiothoracic Vascular Surgery) | Admitting: Thoracic Surgery (Cardiothoracic Vascular Surgery)

## 2017-04-12 ENCOUNTER — Encounter (HOSPITAL_COMMUNITY)
Admission: RE | Admit: 2017-04-12 | Discharge: 2017-04-12 | Disposition: A | Discharge status: Home / Self Care | Payer: Medicare Other | Source: Ambulatory Visit | Attending: Thoracic Surgery (Cardiothoracic Vascular Surgery) | Admitting: Thoracic Surgery (Cardiothoracic Vascular Surgery)

## 2017-04-12 ENCOUNTER — Ambulatory Visit (HOSPITAL_BASED_OUTPATIENT_CLINIC_OR_DEPARTMENT_OTHER)
Admission: RE | Admit: 2017-04-12 | Discharge: 2017-04-12 | Disposition: A | Discharge status: Home / Self Care | Payer: Medicare Other | Source: Ambulatory Visit | Attending: Thoracic Surgery (Cardiothoracic Vascular Surgery) | Admitting: Thoracic Surgery (Cardiothoracic Vascular Surgery)

## 2017-04-12 ENCOUNTER — Encounter (HOSPITAL_COMMUNITY): Payer: Self-pay

## 2017-04-12 DIAGNOSIS — R9431 Abnormal electrocardiogram [ECG] [EKG]: Secondary | ICD-10-CM | POA: Diagnosis not present

## 2017-04-12 DIAGNOSIS — Z79899 Other long term (current) drug therapy: Secondary | ICD-10-CM | POA: Diagnosis not present

## 2017-04-12 DIAGNOSIS — Z87891 Personal history of nicotine dependence: Secondary | ICD-10-CM | POA: Diagnosis not present

## 2017-04-12 DIAGNOSIS — I340 Nonrheumatic mitral (valve) insufficiency: Secondary | ICD-10-CM

## 2017-04-12 DIAGNOSIS — Z7901 Long term (current) use of anticoagulants: Secondary | ICD-10-CM | POA: Insufficient documentation

## 2017-04-12 DIAGNOSIS — Z01818 Encounter for other preprocedural examination: Secondary | ICD-10-CM | POA: Insufficient documentation

## 2017-04-12 DIAGNOSIS — R942 Abnormal results of pulmonary function studies: Secondary | ICD-10-CM | POA: Diagnosis not present

## 2017-04-12 DIAGNOSIS — Z01810 Encounter for preprocedural cardiovascular examination: Secondary | ICD-10-CM | POA: Insufficient documentation

## 2017-04-12 LAB — COMPREHENSIVE METABOLIC PANEL
ALK PHOS: 57 U/L (ref 38–126)
ALT: 18 U/L (ref 14–54)
AST: 19 U/L (ref 15–41)
Albumin: 3.7 g/dL (ref 3.5–5.0)
Anion gap: 10 (ref 5–15)
BUN: 23 mg/dL — ABNORMAL HIGH (ref 6–20)
CALCIUM: 9.2 mg/dL (ref 8.9–10.3)
CO2: 27 mmol/L (ref 22–32)
CREATININE: 1.39 mg/dL — AB (ref 0.44–1.00)
Chloride: 101 mmol/L (ref 101–111)
GFR, EST AFRICAN AMERICAN: 41 mL/min — AB (ref >60)
GFR, EST NON AFRICAN AMERICAN: 35 mL/min — AB (ref >60)
Glucose, Bld: 110 mg/dL — ABNORMAL HIGH (ref 65–99)
Potassium: 2.9 mmol/L — ABNORMAL LOW (ref 3.5–5.1)
Sodium: 138 mmol/L (ref 135–145)
Total Bilirubin: 0.8 mg/dL (ref 0.3–1.2)
Total Protein: 6.2 g/dL — ABNORMAL LOW (ref 6.5–8.1)

## 2017-04-12 LAB — PULMONARY FUNCTION TEST
DL/VA % PRED: 81 %
DL/VA: 4.09 ml/min/mmHg/L
DLCO unc % pred: 57 %
DLCO unc: 15.39 ml/min/mmHg
FEF 25-75 Post: 2.27 L/sec
FEF 25-75 Pre: 1.33 L/sec
FEF2575-%CHANGE-POST: 71 %
FEF2575-%PRED-POST: 139 %
FEF2575-%Pred-Pre: 81 %
FEV1-%CHANGE-POST: 14 %
FEV1-%PRED-PRE: 70 %
FEV1-%Pred-Post: 80 %
FEV1-PRE: 1.54 L
FEV1-Post: 1.76 L
FEV1FVC-%Change-Post: 7 %
FEV1FVC-%PRED-PRE: 105 %
FEV6-%Change-Post: 6 %
FEV6-%Pred-Post: 75 %
FEV6-%Pred-Pre: 70 %
FEV6-POST: 2.1 L
FEV6-Pre: 1.97 L
FEV6FVC-%Change-Post: 0 %
FEV6FVC-%PRED-POST: 105 %
FEV6FVC-%Pred-Pre: 105 %
FVC-%CHANGE-POST: 6 %
FVC-%PRED-POST: 71 %
FVC-%PRED-PRE: 67 %
FVC-POST: 2.1 L
FVC-PRE: 1.98 L
PRE FEV6/FVC RATIO: 100 %
Post FEV1/FVC ratio: 84 %
Post FEV6/FVC ratio: 100 %
Pre FEV1/FVC ratio: 78 %
RV % pred: 93 %
RV: 2.29 L
TLC % pred: 84 %
TLC: 4.52 L

## 2017-04-12 LAB — URINALYSIS, ROUTINE W REFLEX MICROSCOPIC
BILIRUBIN URINE: NEGATIVE
Bacteria, UA: NONE SEEN
Glucose, UA: NEGATIVE mg/dL
Ketones, ur: NEGATIVE mg/dL
Leukocytes, UA: NEGATIVE
NITRITE: NEGATIVE
PROTEIN: NEGATIVE mg/dL
Specific Gravity, Urine: 1.006 (ref 1.005–1.030)
Squamous Epithelial / LPF: NONE SEEN
pH: 7 (ref 5.0–8.0)

## 2017-04-12 LAB — BLOOD GAS, ARTERIAL
ACID-BASE EXCESS: 6.4 mmol/L — AB (ref 0.0–2.0)
BICARBONATE: 29.8 mmol/L — AB (ref 20.0–28.0)
Drawn by: 421801
O2 Saturation: 97.1 %
PCO2 ART: 38.5 mmHg (ref 32.0–48.0)
Patient temperature: 98.6
pH, Arterial: 7.501 — ABNORMAL HIGH (ref 7.350–7.450)
pO2, Arterial: 88 mmHg (ref 83.0–108.0)

## 2017-04-12 LAB — PROTIME-INR
INR: 0.99
Prothrombin Time: 13 seconds (ref 11.4–15.2)

## 2017-04-12 LAB — CBC
HCT: 39.5 % (ref 36.0–46.0)
HEMOGLOBIN: 13 g/dL (ref 12.0–15.0)
MCH: 30.3 pg (ref 26.0–34.0)
MCHC: 32.9 g/dL (ref 30.0–36.0)
MCV: 92.1 fL (ref 78.0–100.0)
PLATELETS: 205 10*3/uL (ref 150–400)
RBC: 4.29 MIL/uL (ref 3.87–5.11)
RDW: 13.1 % (ref 11.5–15.5)
WBC: 12.4 10*3/uL — ABNORMAL HIGH (ref 4.0–10.5)

## 2017-04-12 LAB — ABO/RH: ABO/RH(D): A POS

## 2017-04-12 LAB — SURGICAL PCR SCREEN
MRSA, PCR: NEGATIVE
Staphylococcus aureus: POSITIVE — AB

## 2017-04-12 LAB — APTT: aPTT: 32 seconds (ref 24–36)

## 2017-04-12 LAB — HEMOGLOBIN A1C
HEMOGLOBIN A1C: 5.8 % — AB (ref 4.8–5.6)
MEAN PLASMA GLUCOSE: 119.76 mg/dL

## 2017-04-12 MED ORDER — ALBUTEROL SULFATE (2.5 MG/3ML) 0.083% IN NEBU
2.5000 mg | INHALATION_SOLUTION | Freq: Once | RESPIRATORY_TRACT | Status: AC
  Administered 2017-04-12: 2.5 mg via RESPIRATORY_TRACT

## 2017-04-12 NOTE — Progress Notes (Signed)
Pre-op Cardiac Surgery  Carotid Findings:  Bilateral - No obvious evidence of extracranial ICA stenosis.  Upper Extremity Right Left  Brachial Pressures 160 Triphasic 160 Triphasic  Radial Waveforms Triphasic Triphasic  Ulnar Waveforms Triphasic Triohasic  Palmar Arch (Allen's Test) Abnormal Abnormal   Findings:  Right - Doppler waveforms remained noraml with radial compression and reversed with u;nar compression. Left - Doppler waveforms remained normal with radial compression and diminished greater than 50% with ulnar compression.  [employer], RVS 04/12/2017. 1:55 pm

## 2017-04-13 LAB — VAS US DOPPLER PRE CABG
LCCADDIAS: -8 cm/s
LEFT ECA DIAS: -5 cm/s
LEFT VERTEBRAL DIAS: -12 cm/s
LICADSYS: -94 cm/s
Left CCA dist sys: -63 cm/s
Left CCA prox dias: 10 cm/s
Left CCA prox sys: 85 cm/s
Left ICA dist dias: -13 cm/s
Left ICA prox dias: -10 cm/s
Left ICA prox sys: -49 cm/s
RCCAPDIAS: 7 cm/s
RIGHT ECA DIAS: -9 cm/s
RIGHT VERTEBRAL DIAS: 5 cm/s
Right CCA prox sys: 65 cm/s
Right cca dist sys: -63 cm/s

## 2017-04-15 ENCOUNTER — Ambulatory Visit (INDEPENDENT_AMBULATORY_CARE_PROVIDER_SITE_OTHER): Payer: Medicare Other | Admitting: Thoracic Surgery (Cardiothoracic Vascular Surgery)

## 2017-04-15 ENCOUNTER — Encounter: Payer: Self-pay | Admitting: Thoracic Surgery (Cardiothoracic Vascular Surgery)

## 2017-04-15 VITALS — BP 137/76 | HR 60 | Ht 66.5 in | Wt 142.0 lb

## 2017-04-15 DIAGNOSIS — I341 Nonrheumatic mitral (valve) prolapse: Secondary | ICD-10-CM

## 2017-04-15 DIAGNOSIS — I480 Paroxysmal atrial fibrillation: Secondary | ICD-10-CM

## 2017-04-15 DIAGNOSIS — I340 Nonrheumatic mitral (valve) insufficiency: Secondary | ICD-10-CM | POA: Diagnosis not present

## 2017-04-15 MED ORDER — VANCOMYCIN HCL 1000 MG IV SOLR
INTRAVENOUS | Status: AC
  Administered 2017-04-16: 1000 mL
  Filled 2017-04-15: qty 1000

## 2017-04-15 MED ORDER — INSULIN REGULAR HUMAN 100 UNIT/ML IJ SOLN
INTRAMUSCULAR | Status: DC
  Filled 2017-04-15: qty 1

## 2017-04-15 MED ORDER — MAGNESIUM SULFATE 50 % IJ SOLN
40.0000 meq | INTRAMUSCULAR | Status: DC
  Filled 2017-04-15: qty 10

## 2017-04-15 MED ORDER — TRANEXAMIC ACID 1000 MG/10ML IV SOLN
1.5000 mg/kg/h | INTRAVENOUS | Status: DC
  Filled 2017-04-15: qty 25

## 2017-04-15 MED ORDER — EPINEPHRINE PF 1 MG/ML IJ SOLN
0.0000 ug/min | INTRAVENOUS | Status: DC
  Filled 2017-04-15: qty 4

## 2017-04-15 MED ORDER — KENNESTONE BLOOD CARDIOPLEGIA VIAL
13.0000 mL | Freq: Once | Status: DC
  Filled 2017-04-15: qty 13

## 2017-04-15 MED ORDER — TRANEXAMIC ACID (OHS) PUMP PRIME SOLUTION
2.0000 mg/kg | INTRAVENOUS | Status: DC
  Filled 2017-04-15: qty 1.28

## 2017-04-15 MED ORDER — TRANEXAMIC ACID (OHS) BOLUS VIA INFUSION
15.0000 mg/kg | INTRAVENOUS | Status: AC
  Administered 2017-04-16: 960 mg via INTRAVENOUS
  Filled 2017-04-15: qty 960

## 2017-04-15 MED ORDER — SODIUM CHLORIDE 0.9 % IV SOLN
30.0000 ug/min | INTRAVENOUS | Status: DC
  Filled 2017-04-15: qty 2

## 2017-04-15 MED ORDER — KENNESTONE BLOOD CARDIOPLEGIA (KBC) MANNITOL SYRINGE (20%, 32ML)
32.0000 mL | Freq: Once | INTRAVENOUS | Status: DC
  Filled 2017-04-15: qty 32

## 2017-04-15 MED ORDER — VANCOMYCIN HCL 10 G IV SOLR
1250.0000 mg | INTRAVENOUS | Status: AC
  Administered 2017-04-16: 1250 mg via INTRAVENOUS
  Filled 2017-04-15: qty 1250

## 2017-04-15 MED ORDER — DEXMEDETOMIDINE HCL IN NACL 400 MCG/100ML IV SOLN
0.1000 ug/kg/h | INTRAVENOUS | Status: DC
  Filled 2017-04-15: qty 100

## 2017-04-15 MED ORDER — CHLORHEXIDINE GLUCONATE 0.12 % MT SOLN
15.0000 mL | Freq: Once | OROMUCOSAL | Status: AC
  Administered 2017-04-16: 15 mL via OROMUCOSAL
  Filled 2017-04-15: qty 15

## 2017-04-15 MED ORDER — POTASSIUM CHLORIDE 2 MEQ/ML IV SOLN
80.0000 meq | INTRAVENOUS | Status: DC
  Filled 2017-04-15: qty 40

## 2017-04-15 MED ORDER — GLUTARALDEHYDE 0.625% SOAKING SOLUTION
TOPICAL | Status: DC
  Filled 2017-04-15: qty 50

## 2017-04-15 MED ORDER — NITROGLYCERIN IN D5W 200-5 MCG/ML-% IV SOLN
2.0000 ug/min | INTRAVENOUS | Status: DC
  Filled 2017-04-15: qty 250

## 2017-04-15 MED ORDER — DEXTROSE 5 % IV SOLN
1.5000 g | INTRAVENOUS | Status: AC
  Administered 2017-04-16: 1.5 g via INTRAVENOUS
  Administered 2017-04-16: .75 g via INTRAVENOUS
  Filled 2017-04-15: qty 1.5

## 2017-04-15 MED ORDER — SODIUM CHLORIDE 0.9 % IV SOLN
INTRAVENOUS | Status: DC
  Filled 2017-04-15: qty 30

## 2017-04-15 MED ORDER — PLASMA-LYTE 148 IV SOLN
INTRAVENOUS | Status: DC
  Filled 2017-04-15: qty 2.5

## 2017-04-15 MED ORDER — DOPAMINE-DEXTROSE 3.2-5 MG/ML-% IV SOLN
0.0000 ug/kg/min | INTRAVENOUS | Status: DC
  Filled 2017-04-15: qty 250

## 2017-04-15 MED ORDER — DEXTROSE 5 % IV SOLN
750.0000 mg | INTRAVENOUS | Status: DC
  Filled 2017-04-15: qty 750

## 2017-04-15 MED ORDER — METOPROLOL TARTRATE 12.5 MG HALF TABLET: 12.5000 mg | ORAL_TABLET | Freq: Once | ORAL | Status: DC

## 2017-04-15 NOTE — Progress Notes (Signed)
Anesthesia Chart Review:  Pt is a 76 year old female scheduled for minimally invasive mitral valve repair, minimally invasive maze procedure, TEE on 04/16/2017 with Tressie Stalker, MD  - PCP is [name], MD - Cardiologist is Armanda Magic, MD - EP cardiologist is Lewayne Bunting, MD  PMH includes:  MVP, PAF, Mobitz II, intermittent complete heart block, pacemaker ([employer]), HTN, CKD (stage III), hypothyroidism, hyperparathyroidism, hyperlipidemia.  Former smoker. BMI 22.5  Medications include: eliquis, atenolol, lasix, levothyroxine. Eliquis stopped 04/09/17.   BP (!) 151/50   Pulse 64   Temp 36.7 C (Oral)   Resp 16   Ht 5' 6.5" (1.689 m)   Wt 141 lb (64 kg)   SpO2 100%   BMI 22.42 kg/m   Preoperative labs reviewed.   - HbA1c 5.8, glucose 110 - K 2.9 - I notified Darl Pikes in Dr. Orvan July office of low K. Will recheck DOS.   CXR 04/12/17: No acute cardiopulmonary disease.  EKG 04/12/17: Ventricular-paced rhythm  Carotid duplex 04/12/17:  - Bilateral - No obvious evidence of extracranial ICA stenosis. - Vertebral artery flow is antegrade.  Cardiac cath 03/26/17:   The left ventricular systolic function is normal.  Essentially normal right heart cath numbers with mildly elevated LVEDP and PCWP. Please tell if he has a very large V wave however consistent with severe MR.  The left ventricular ejection fraction is 55-65% by visual estimate.  There is moderate-severe (4+) mitral regurgitation.  Angiographically normal coronary arteries, but significant tortuosity  - Angiographic normal coronary arteries with moderate-severe mitral regurgitation. Large V wave on PCWP.  TEE 03/18/17:  - Left ventricle: Systolic function was normal. The estimated ejection fraction was in the range of 55% to 60%. Wall motion was normal; there were no regional wall motion abnormalities. - Aortic valve: There was no significant regurgitation. - Mitral valve: Moderate prolapse, involving the middle  scallop of the posterior leaflet. Regurgitant volume (PISA): 64 ml. - Left atrium: No evidence of thrombus in the atrial cavity or appendage. No evidence of thrombus in the atrial cavity or appendage. - Right ventricle: The cavity size was normal. Wall thickness was normal. Systolic function was normal. - Right atrium: No evidence of thrombus in the atrial cavity or appendage. - Atrial septum: There appeared to be a small patent foramen ovale by color flow Doppler. However, the saline microcavitation study was negative both at rest and with abdominal pressure, indicating that there is no right to left flow. - Tricuspid valve: There was no significant regurgitation.  If labs acceptable DOS, I anticipate pt can proceed as scheduled.   [name], FNP-BC [facility]/Anesthesiology Phone: [phone] 04/15/2017 10:43 AM

## 2017-04-15 NOTE — Anesthesia Preprocedure Evaluation (Addendum)
Anesthesia Evaluation  Patient identified by MRN, date of birth, ID band Patient awake    Reviewed: Allergy & Precautions, NPO status , Patient's Chart, lab work & pertinent test results, reviewed documented beta blocker date and time   History of Anesthesia Complications Negative for: history of anesthetic complications  Airway Mallampati: III  TM Distance: >3 FB Neck ROM: Full    Dental  (+) Teeth Intact, Dental Advisory Given, Caps   Pulmonary former smoker,    breath sounds clear to auscultation       Cardiovascular hypertension, Pt. on medications and Pt. 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) (-) Valvular Problems/MurmursMR  Rhythm:Irregular Rate:Normal  9/18 ECHO: EF 55-60%, mod MR with prolapse of middle scallop posterior leaflet, small PFO, mod TR   Neuro/Psych Anxiety negative neurological ROS     GI/Hepatic negative GI ROS, Neg liver ROS,   Endo/Other  Hypothyroidism   Renal/GU Renal InsufficiencyRenal disease (creat 1.39)H/o glomerulonephritis     Musculoskeletal  (+) Arthritis , Osteoarthritis,    Abdominal   Peds  Hematology eliquis   Anesthesia Other Findings   Reproductive/Obstetrics                           Anesthesia Physical Anesthesia Plan  ASA: III  Anesthesia Plan: General   Post-op Pain Management:    Induction: Intravenous  PONV Risk Score and Plan: 3 and Midazolam and Treatment may vary due to age or medical condition  Airway Management Planned: Oral ETT and Double Lumen EBT  Additional Equipment: CVP, PA Cath, Ultrasound Guidance Line Placement, Arterial line and 3D TEE  Intra-op Plan:   Post-operative Plan: Post-operative intubation/ventilation  Informed Consent: I have reviewed the patients History and Physical, chart, labs and discussed the procedure including the risks,  benefits and alternatives for the proposed anesthesia with the patient or authorized representative who has indicated his/her understanding and acceptance.   Dental advisory given  Plan Discussed with: CRNA, Anesthesiologist and Surgeon  Anesthesia Plan Comments: (Plan routine monitors, A line, PA catheter, GETA with DLT, TEE, post op ventilation VideoGlide scope available)      Anesthesia Quick Evaluation

## 2017-04-15 NOTE — Progress Notes (Signed)
[address]       [name] [address]             [phone]     CARDIOTHORACIC SURGERY OFFICE NOTE  Referring Provider is Laurann Montana, PA-C  Primary Cardiologist is Quintella Reichert, MD Primary Electrophysiologist is Marinus Maw, MD PCP is [name], MD   HPI:  Patient returns to the office today with plans to proceed with minimally invasive mitral valve repair and Maze procedure tomorrow. She was originally seen in consultation on 04/03/2017. Since then she reports no new problems or complaints. She stopped taking Eliquis last week in anticipation of surgery.   Current Outpatient Prescriptions  Medication Sig Dispense Refill  . atenolol (TENORMIN) 50 MG tablet Take 1 tablet (50 mg total) by mouth daily. (Patient taking differently: Take 50 mg by mouth 2 (two) times daily. ) 30 tablet 11  . conjugated estrogens (PREMARIN) vaginal cream Place 1 Applicatorful vaginally every 3 (three) days.     . fluticasone (FLONASE) 50 MCG/ACT nasal spray Place 2 sprays into both nostrils daily.    . furosemide (LASIX) 20 MG tablet Take 1 tablet (20 mg total) by mouth daily. 90 tablet 3  . hydrocortisone valerate ointment (WEST-CORT) 0.2 % Apply 1 application topically 2 (two) times daily as needed (for itching).     [name] levocetirizine (XYZAL) 5 MG tablet Take 5 mg by mouth every evening.    [name] levothyroxine (SYNTHROID, LEVOTHROID) 75 MCG tablet Take 75 mcg by mouth See admin instructions. Take 75 mcg by mouth daily around 0300    . naproxen sodium (ANAPROX) 220 MG tablet Take 220 mg by mouth 2 (two) times daily as needed (for pain).     . NON FORMULARY 1 each by Other route See admin instructions. Allergy injections once weekly    . Probiotic Product (PROBIOTIC DAILY PO) Take 1 tablet by mouth daily.    [name] apixaban (ELIQUIS) 5 MG TABS tablet Take 1 tablet (5 mg total) by mouth 2 (two) times daily. (Patient not taking: Reported on 04/15/2017) 60 tablet 11   Current  Facility-Administered Medications  Medication Dose Route Frequency Provider Last Rate Last Dose  . 0.9 %  sodium chloride infusion  500 mL Intravenous Continuous Armbruster, Willaim Rayas, MD       Facility-Administered Medications Ordered in Other Visits  Medication Dose Route Frequency Provider Last Rate Last Dose  . [START ON 04/16/2017] cefUROXime (ZINACEF) 1.5 g in dextrose 5 % 50 mL IVPB  1.5 g Intravenous To OR Purcell Nails, MD      . Melene Muller ON 04/16/2017] cefUROXime (ZINACEF) 750 mg in dextrose 5 % 50 mL IVPB  750 mg Intravenous To OR Purcell Nails, MD      . Melene Muller ON 04/16/2017] chlorhexidine (PERIDEX) 0.12 % solution 15 mL  15 mL Mouth/Throat Once Purcell Nails, MD      . Melene Muller ON 04/16/2017] dexmedetomidine (PRECEDEX) 400 MCG/100ML (4 mcg/mL) infusion  0.1-0.7 mcg/kg/hr Intravenous To OR Purcell Nails, MD      . Melene Muller ON 04/16/2017] DOPamine (INTROPIN) 800 mg in dextrose 5 % 250 mL (3.2 mg/mL) infusion  0-10 mcg/kg/min Intravenous To OR Purcell Nails, MD      . Melene Muller ON 04/16/2017] EPINEPHrine (ADRENALIN) 4 mg in dextrose 5 % 250 mL (0.016 mg/mL) infusion  0-10 mcg/min Intravenous To OR Purcell Nails, MD      . Melene Muller ON 04/16/2017] glutaraldehyde  0.625% cardiac soaking solution   Topical To OR Purcell Nails, MD      . Melene Muller ON 04/16/2017] heparin 2,500 Units, papaverine 30 mg in electrolyte-148 (PLASMALYTE-148) 500 mL irrigation   Irrigation To OR Purcell Nails, MD      . Melene Muller ON 04/16/2017] heparin 30,000 units/NS 1000 mL solution for CELLSAVER   Other To OR Purcell Nails, MD      . Melene Muller ON 04/16/2017] insulin regular (NOVOLIN R,HUMULIN R) 100 Units in sodium chloride 0.9 % 100 mL (1 Units/mL) infusion   Intravenous To OR Purcell Nails, MD      . Melene Muller ON 04/16/2017] [facility] Cardioplegia (KBC) lidocaine 2% Syringe (13mL)  13 mL Intracoronary Once Purcell Nails, MD      . Melene Muller ON 04/16/2017] [facility] Blood Cardioplegia (KBC) lidocaine 2% Syringe  (13mL)  13 mL Intracoronary Once Purcell Nails, MD      . Melene Muller ON 04/16/2017] [facility] Blood Cardioplegia (KBC) mannitol 20% Syringe (32mL)  32 mL Intracoronary Once Purcell Nails, MD      . Melene Muller ON 04/16/2017] [facility] Blood Cardioplegia (KBC) mannitol 20% Syringe (32mL)  32 mL Intracoronary Once Purcell Nails, MD      . Melene Muller ON 04/16/2017] magnesium sulfate (IV Push/IM) injection 40 mEq  40 mEq Other To OR Purcell Nails, MD      . Melene Muller ON 04/16/2017] metoprolol tartrate (LOPRESSOR) tablet 12.5 mg  12.5 mg Oral Once Purcell Nails, MD      . Melene Muller ON 04/16/2017] nitroGLYCERIN 50 mg in dextrose 5 % 250 mL (0.2 mg/mL) infusion  2-200 mcg/min Intravenous To OR Purcell Nails, MD      . Melene Muller ON 04/16/2017] phenylephrine (NEO-SYNEPHRINE) 20 mg in sodium chloride 0.9 % 250 mL (0.08 mg/mL) infusion  30-200 mcg/min Intravenous To OR Purcell Nails, MD      . Melene Muller ON 04/16/2017] potassium chloride injection 80 mEq  80 mEq Other To OR Purcell Nails, MD      . Melene Muller ON 04/16/2017] tranexamic acid (CYKLOKAPRON) 2,500 mg in sodium chloride 0.9 % 250 mL (10 mg/mL) infusion  1.5 mg/kg/hr Intravenous To OR Purcell Nails, MD      . Melene Muller ON 04/16/2017] tranexamic acid (CYKLOKAPRON) bolus via infusion - over 30 minutes 960 mg  15 mg/kg Intravenous To OR Purcell Nails, MD      . Melene Muller ON 04/16/2017] tranexamic acid (CYKLOKAPRON) pump prime solution 128 mg  2 mg/kg Intracatheter To OR Purcell Nails, MD      . Melene Muller ON 04/16/2017] vancomycin (VANCOCIN) 1,000 mg in sodium chloride 0.9 % 1,000 mL irrigation   Irrigation To OR Purcell Nails, MD      . Melene Muller ON 04/16/2017] vancomycin (VANCOCIN) 1,250 mg in sodium chloride 0.9 % 250 mL IVPB  1,250 mg Intravenous To OR Purcell Nails, MD          Physical Exam:   BP 137/76   Pulse 60   Ht 5' 6.5" (1.689 m)   Wt 142 lb (64.4 kg)   SpO2 97%   BMI 22.58 kg/m   General:  Well-appearing  Chest:   Clear to  auscultation  CV:   Regular rate and rhythm with systolic murmur  Incisions:  n/a  Abdomen:  Soft and nontender  Extremities:  Warm and well-perfused  Diagnostic Tests:  CT ANGIOGRAPHY CHEST, ABDOMEN AND PELVIS  TECHNIQUE: Multidetector CT imaging through the chest,  abdomen and pelvis was performed using the standard protocol during bolus administration of intravenous contrast. Multiplanar reconstructed images and MIPs were obtained and reviewed to evaluate the vascular anatomy.  CONTRAST:  75 mL Isovue 370  COMPARISON:  None.  FINDINGS: CTA CHEST FINDINGS  Cardiovascular: Initial noncontrast CT images demonstrate no high attenuation material in the region of the aortic media to suggest an acute intramural hematoma. Following injection of intravenous contrast material, the aorta and its branches are well opacified. 2 vessel aortic arch. The right brachiocephalic and left common carotid artery share a common origin. The aortic root is within normal limits at 3.5 cm in diameter. The tubular portion of the ascending aorta is also within normal limits at 3.3 cm in diameter. The aortic arch and descending thoracic aorta are also normal in caliber. Atherosclerotic calcifications are present in the aortic arch. A left subclavian approach cardiac rhythm maintenance device is noted. Leads terminate in the right atrium and right ventricular apex. Trace coronary artery calcifications. Mild cardiomegaly. Slight thickening of the mitral valve cusps consistent with the clinical history of mitral valve disease. No pericardial effusion.  Mediastinum/Nodes: Unremarkable CT appearance of the thyroid gland. No suspicious mediastinal or hilar adenopathy. No soft tissue mediastinal mass. The thoracic esophagus is unremarkable.  Lungs/Pleura: Mild biapical pleuroparenchymal scarring. No pulmonary edema, pleural effusion or pneumothorax. No focal airspace consolidation. No suspicious  pulmonary mass or nodule.  Musculoskeletal: No acute fracture or aggressive appearing lytic or blastic osseous lesion.  Review of the MIP images confirms the above findings.  CTA ABDOMEN AND PELVIS FINDINGS  VASCULAR  Aorta: Mild heterogeneous atherosclerotic plaque throughout the abdominal aorta. No evidence of aneurysm or dissection.  Celiac: Patent without evidence of aneurysm, dissection, vasculitis or significant stenosis. The lateral segmental branch of the left hepatic artery is replaced to the left gastric artery.  SMA: Patent without evidence of aneurysm, dissection, vasculitis or significant stenosis.  Renals: Both renal arteries are patent without evidence of aneurysm, dissection, vasculitis, fibromuscular dysplasia or significant stenosis.  IMA: Patent without evidence of aneurysm, dissection, vasculitis or significant stenosis.  Inflow: Patent without evidence of aneurysm, dissection, vasculitis or significant stenosis.  Veins: No obvious venous abnormality within the limitations of this arterial phase study.  Review of the MIP images confirms the above findings.  NON-VASCULAR  Hepatobiliary: Normal hepatic contour and morphology. Circumscribed 1.4 cm water attenuation structure centrally within hepatic segment 8 is most consistent with a simple cyst. There is a second smaller subcentimeter low-attenuation lesion in hepatic segment 2 which is too small to characterize but also statistically likely a benign cyst. No definite solid hepatic mass. Gallbladder is unremarkable. No intra or extrahepatic biliary ductal dilatation.  Pancreas: Unremarkable. No pancreatic ductal dilatation or surrounding inflammatory changes.  Spleen: Normal in size without focal abnormality.  Adrenals/Urinary Tract: Normal adrenal glands. No hydronephrosis, nephrolithiasis or enhancing renal mass. Multiple circumscribed bilateral hypodense renal lesions all  measuring less than 1 cm in diameter are too small for accurate characterization. Statistically, these are highly likely benign cysts. The bladder is relatively decompressed and unremarkable in appearance.  Stomach/Bowel: No evidence of obstruction or focal bowel wall thickening. Normal appendix in the right lower quadrant. The terminal ileum is unremarkable.  Lymphatic: No suspicious lymphadenopathy.  Reproductive: Status post hysterectomy. No adnexal masses.  Other: No abdominal wall hernia or abnormality. No abdominopelvic ascites.  Musculoskeletal: No acute fracture or aggressive appearing lytic or blastic osseous lesion. Mild levoconvex scoliosis centered at the thoracolumbar junction with  associated multilevel thoracolumbar degenerative disc disease.  Review of the MIP images confirms the above findings.  IMPRESSION: CTA CHEST  1. Mildly thickened mitral valve consistent with the clinical history of mitral valvular disease. 2. Normal caliber thoracic aorta and aortic root. 3. Mild coronary artery calcifications. 4. Mild cardiomegaly. 5. Cardiac rhythm maintenance device with leads terminating in the right atrium and right ventricular apex. 6.  Aortic Atherosclerosis (ICD10-170.0) 7. Mild biapical pleuroparenchymal scarring. CTA ABD/PELVIS  1. Mild scattered atherosclerotic plaque without evidence of aneurysm, dissection or significant stenosis. 2. Variant hepatic artery anatomy noted incidentally. 3. Probable hepatic and bilateral renal cysts. 4. Surgical changes of prior hysterectomy. 5. Mild levoconvex scoliosis centered at the thoracolumbar junction with associated multilevel thoracolumbar degenerative disc disease.  Signed,  Sterling Big, MD  [facility]  [facility]   Electronically Signed   By: Malachy Moan M.D.   On: 04/08/2017 15:37   Impression:  Patient has mitral  valve prolapse with stage D severe symptomatic primary mitral regurgitation. I have personally reviewed the patient's recent transthoracic and transesophageal echocardiograms and diagnostic cardiac catheterization. 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 There is no obvious ruptured chordae tendineae or flail segments, but there are 2 severe jets of regurgitation coursed around the severe prolapsing segment. Left ventricular systolic function remains preserved.  There is left atrial enlargement. Diagnostic cardiac catheterization is notable for the absence of significant coronary artery disease.  Right heart catheterization revealed mildly elevated pulmonary artery pressures but there were large V waves on wedge tracings consistent with severe mitral regurgitation. I agree the patient would benefit from elective mitral valve repair. She might benefit from concomitant maze procedure. Risks associated with surgery should be acceptably low although mildly elevated because of the patient's age and other medical problems. She may be a reasonably good candidate for minimally invasive approach for surgery.  CT angiography reveals no contraindications to peripheral cannulation for surgery.  Plan:  The patient and her family were again counseled at length regarding the indications, risks and potential benefits of mitral valve repair and maze procedure.   expectations for her postoperative convalescence at been discussed. The rationale for elective surgery has been explained, including a comparison between surgery and continued medical therapy with close follow-up.  The likelihood of successful and durable valve repair has been discussed with particular reference to the findings of their recent echocardiogram.  Based upon these findings and previous experience, I have quoted them a greater than 90 percent likelihood  of successful valve repair.  In the unlikely event that their valve cannot be successfully repaired, we discussed the possibility of replacing the mitral valve using a mechanical prosthesis with the attendant need for long-term anticoagulation versus the alternative of replacing it using a bioprosthetic tissue valve with its potential for late structural valve deterioration and failure, depending upon the patient's longevity.  The patient specifically requests that if the mitral valve must be replaced that it be done using a bioprosthetic tissue valve.   The relative risks and benefits of performing a maze procedure  at the time of their surgery was discussed at length, including the expected likelihood of long term freedom from recurrent symptomatic atrial fibrillation and/or atrial flutter.    The patient understands and accepts all potential risks of surgery including but not limited to risk of death, stroke or other neurologic complication, myocardial infarction, congestive heart failure,  respiratory failure, renal failure, bleeding requiring transfusion and/or reexploration, arrhythmia, infection or other wound complications, pneumonia, pleural and/or pericardial effusion, pulmonary embolus, aortic dissection or other major vascular complication, or delayed complications related to valve repair or replacement including but not limited to structural valve deterioration and failure, thrombosis, embolization, endocarditis, or paravalvular leak.  Alternative surgical approaches have been discussed including a comparison between conventional sternotomy and minimally-invasive techniques.  The relative risks and benefits of each have been reviewed as they pertain to the patient's specific circumstances, and all of their questions have been addressed.  Specific risks potentially related to the minimally-invasive approach were discussed at length, including but not limited to risk of conversion to full or partial  sternotomy, aortic dissection or other major vascular complication, unilateral acute lung injury or pulmonary edema, phrenic nerve dysfunction or paralysis, rib fracture, chronic pain, lung hernia, or lymphocele.  All of their questions have been answered.    I spent in excess of 15 minutes during the conduct of this office consultation and >50% of this time involved direct face-to-face encounter with the patient for counseling and/or coordination of their care.   Salvatore Decent. Cornelius Moras, MD 04/15/2017 10:29 AM

## 2017-04-15 NOTE — H&P (Signed)
[address]       [name] [address]             [phone]          CARDIOTHORACIC SURGERY HISTORY AND PHYSICAL EXAM  Referring Provider is Laurann Montana, PA-C  Primary Cardiologist is Quintella Reichert, MD Primary Electrophysiologist is Marinus Maw, MD PCP is [name], MD      Chief Complaint  Patient presents with  . 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 The patient states that she was first noted to have a heart murmur on physical exam more than 30 years ago. For many years she was followed by Dr. [name] with known history of mitral valve prolapse and more recently she has been followed by [name].  She developed symptomatic bradycardia for which she underwent placement of a permanent pacemaker by Dr. [name] in 2016.  Echocardiogram at that time revealed normal left ventricular systolic function with mild to moderate mitral regurgitation. Follow-up echocardiogram performed in June 2017 revealed preserved left ventricular systolic function but significant worsening in severity of mitral regurgitation. The patient remained asymptomatic at that time. Interrogation of her pacemaker revealed episodes of paroxysmal atrial fibrillation. The patient was started on Eliquis for anticoagulation.  Over the past couple of months the patient has  developed significant progression of symptoms of exertional shortness of breath.  TEE was planned but had to be delayed because of esophageal dysmotility with a lower esophageal ring. She subsequently underwent EGD with esophageal dilatation. TEE performed 03/18/2017 revealed normal left ventricular systolic function with mitral valve prolapse causing severe mitral regurgitation. Patient was referred for elective surgical consultation.  The patient is married and lives with her husband in [place]. 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 She has remained reasonably active and functionally independent during retirement.  Over the last several months she has developed significant symptoms of exertional shortness of breath. She denies any symptoms of resting shortness of breath but she gets short of breath if she lays flat in bed. She has not had any palpitations. She denies any history of chest pain or chest tightness. She has not had dizzy spells or syncope. She has a persistent dry cough. Her mobility remains fairly good although she states that she is limited both by exertional shortness of breath as well as muscle pain in the thighs of both legs with ambulation. She has some degenerative arthritis. She does not require any mechanical support for ambulation and she states that her balance is good.  She has had multiple urinary tract infections in the past including one associated with severe sepsis for which she was hospitalized 2 years ago. She had delirium at that time and apparently underwent an MRI of the brain that revealed small vessel disease. She is currently finishing a prescription of antibiotics for a recent urinary infection. She denies any ongoing dysuria.     Past  Medical History:  Diagnosis Date  . Allergic rhinitis   . Anemia   . CKD (chronic kidney disease), stage III (HCC)     stage III  . DJD (degenerative joint disease)   . Glomerulonephritis    Dr [name]  . Hyperlipidemia   . Hyperparathyroidism (HCC)   . Hypertension   . Hypothyroidism   . IBS (irritable bowel syndrome)   . Interstitial cystitis   . Mitral regurgitation   . Mobitz II    a. s/p STJ dual chamber PPM   . MVP (mitral valve prolapse)    moderate posterior MVP with moderate MR and grade II diasotlic dysfunction  . PAC (premature atrial contraction)   . PAF (paroxysmal atrial fibrillation) (HCC)     note on pacer check. CHADS2VASC score is 4 now on Eliquis.  . Paroxysmal atrial fibrillation (HCC)    CHADS2VASC score is 4  . PVC's (premature ventricular contractions)   . S/P placement of cardiac pacemaker   . Small vessel disease, cerebrovascular     Past Surgical History:  Procedure Laterality Date  . ABDOMINAL HYSTERECTOMY    . BREAST BIOPSY Right   . CYSTOSTOMY W/ BLADDER BIOPSY    . EP IMPLANTABLE DEVICE N/A 01/07/2015   STJ dual chamber pacemaker implanted by Dr [name] for 2:1 heart block  . PERIPHERAL VASCULAR CATHETERIZATION N/A 02/03/2015   Procedure: Upper Extremity Venography;  Surgeon: [name], MD;  Location: [facility] INVASIVE CV LAB;  Service: Cardiovascular;  Laterality: N/A;  . RIGHT/LEFT HEART CATH AND CORONARY ANGIOGRAPHY N/A 03/26/2017   Procedure: RIGHT/LEFT HEART CATH AND CORONARY ANGIOGRAPHY;  Surgeon: [name], MD;  Location: [facility] INVASIVE CV LAB;  Service: Cardiovascular;  Laterality: N/A;  . TEE WITHOUT CARDIOVERSION N/A 03/18/2017   Procedure: TRANSESOPHAGEAL ECHOCARDIOGRAM (TEE);  Surgeon: [name], MD;  Location: [facility] ENDOSCOPY;  Service: Cardiovascular;  Laterality: N/A;  . TONSILLECTOMY      Family History  Problem Relation Age of Onset  . Heart disease Mother   . Stroke Mother   . Hypertension Mother   . Heart disease Father   . Heart attack Father   . Ovarian cancer Sister   . Heart disease Brother   . Rheum arthritis Sister   .  Melanoma Brother   . Brain cancer Brother   . Heart attack Brother   . Hypertension Brother   . Hypertension Sister   . Colon cancer Neg Hx     Social History Social History  Substance Use Topics  . Smoking status: Former Games developer  . Smokeless tobacco: Never Used  . Alcohol use No    Prior to Admission medications   Medication Sig Start Date End Date Taking? Authorizing Provider  apixaban (ELIQUIS) 5 MG TABS tablet Take 1 tablet (5 mg total) by mouth 2 (two) times daily. Patient not taking: Reported on 04/15/2017 08/01/16 07/27/17 Yes Turner, Cornelious Bryant, MD  atenolol (TENORMIN) 50 MG tablet Take 1 tablet (50 mg total) by mouth daily. Patient taking differently: Take 50 mg by mouth 2 (two) times daily.  04/02/17  Yes Turner, Cornelious Bryant, MD  conjugated estrogens (PREMARIN) vaginal cream Place 1 Applicatorful vaginally every 3 (three) days.    Yes [provider]  fluticasone (FLONASE) 50 MCG/ACT nasal spray Place 2 sprays into both nostrils daily.   Yes [provider]  furosemide (LASIX) 20 MG tablet Take 1 tablet (20 mg total) by mouth daily. 03/13/17 06/11/17 Yes Dunn, Dayna N, PA-C  hydrocortisone valerate ointment (  WEST-CORT) 0.2 % Apply 1 application topically 2 (two) times daily as needed (for itching).    Yes [provider]  levocetirizine (XYZAL) 5 MG tablet Take 5 mg by mouth every evening.   Yes [provider]  levothyroxine (SYNTHROID, LEVOTHROID) 75 MCG tablet Take 75 mcg by mouth See admin instructions. Take 75 mcg by mouth daily around 0300   Yes [provider]  naproxen sodium (ANAPROX) 220 MG tablet Take 220 mg by mouth 2 (two) times daily as needed (for pain).    Yes [provider]  NON FORMULARY 1 each by Other route See admin instructions. Allergy injections once weekly   Yes [provider]  Probiotic Product (PROBIOTIC DAILY PO) Take 1 tablet by mouth daily.   Yes [provider]    Allergies  Allergen  Reactions  . Pepcid [Famotidine] Swelling and Other (See Comments)    "Throat Swelling"   . Allopurinol Other (See Comments)    Caused headaches  . Amlodipine Other (See Comments)    PEDAL EDEMA   . Colchicine Other (See Comments)    UNSPECIFIED REACTION   . Contrast Media [Iodinated Diagnostic Agents] Hives    IVP dye per patient  . Prilosec [Omeprazole] Nausea Only  . Atorvastatin Other (See Comments)    UNSPECIFIED REACTION   . Cephalexin Itching and Rash  . Ciprofloxacin Itching and Rash  . Erythromycin Itching and Rash  . Sulfonamide Derivatives Itching and Rash  . Tetracycline Itching and Rash     Review of Systems:              General:                      decreased appetite, decreased energy, + weight gain, no weight loss, no fever             Cardiac:                       no chest pain with exertion, no chest pain at rest, + SOB with exertion, no resting SOB, no PND, + orthopnea, no palpitations, no arrhythmia, + atrial fibrillation, no LE edema, no dizzy spells, no syncope             Respiratory:                 + shortness of breath, no home oxygen, no productive cough, + dry cough, no bronchitis, no wheezing, no hemoptysis, no asthma, no pain with inspiration or cough, no sleep apnea, no CPAP at night             GI:                               no difficulty swallowing, + reflux, no frequent heartburn, no hiatal hernia, no abdominal pain, no constipation, no diarrhea, no hematochezia, no hematemesis, no melena             GU:                              no dysuria,  no frequency, + urinary tract infection, no hematuria, no kidney stones, + kidney disease             Vascular:  no pain suggestive of claudication, no pain in feet, no leg cramps, no varicose veins, no DVT, no non-healing foot ulcer             Neuro:                         no stroke, + TIA's, no seizures, no headaches, no temporary blindness one eye,  no slurred speech, no  peripheral neuropathy, no chronic pain, no instability of gait, no memory/cognitive dysfunction             Musculoskeletal:         + arthritis, no joint swelling, + myalgias, no difficulty walking, normal mobility              Skin:                            no rash, no itching, no skin infections, no pressure sores or ulcerations             Psych:                         no anxiety, no depression, no nervousness, no unusual recent stress             Eyes:                           no blurry vision, no floaters, no recent vision changes, + wears glasses or contacts             ENT:                            no hearing loss, no loose or painful teeth, + dentures, last saw dentist July 2018             Hematologic:               + easy bruising, no abnormal bleeding, no clotting disorder, + frequent epistaxis             Endocrine:                   no diabetes, does not check CBG's at home                           Physical Exam:              BP (!) 159/75 (BP Location: Left Arm, Patient Position: Sitting, Cuff Size: Large)   Resp 16   Ht 5' 6.5" (1.689 m)   Wt 144 lb (65.3 kg)   SpO2 98% Comment: ON RA  BMI 22.89 kg/m              General:                      Elderly but well-appearing             HEENT:                       Unremarkable              Neck:  no JVD, no bruits, no adenopathy              Chest:                          clear to auscultation, symmetrical breath sounds, no wheezes, no rhonchi              CV:                              RRR, grade III/VI holosystolic murmur              Abdomen:                    soft, non-tender, no masses              Extremities:                 warm, well-perfused, pulses diminished but palpable, no LE edema             Rectal/GU                   Deferred             Neuro:                         Grossly non-focal and symmetrical throughout             Skin:                            Clean and dry,  no rashes, no breakdown   Diagnostic Tests:  Transthoracic Echocardiography  Patient: Amy, Gothard MR #: [id number] Study Date: 12/15/2015 Gender: F Age: 74 Height: 169.5 cm Weight: 58.9 kg BSA: 1.66 m^2 Pt. Status: Room:  ORDERING Armanda Magic, MD REFERRING Armanda Magic, MD SONOGRAPHER Aida Raider, RDCS ATTENDING Thurmon Fair, MD PERFORMING [facility], Outpatient  cc:  ------------------------------------------------------------------- LV EF: 60% - 65%  ------------------------------------------------------------------- Indications: I05.9 Mitral Valve Disorder.  ------------------------------------------------------------------- History: PMH: Acquired from the patient and from the patient&'s chart. PMH: MVP. Chronic Kidney Disease. Risk factors: Hypertension. Dyslipidemia.  ------------------------------------------------------------------- Study Conclusions  - Left ventricle: The cavity size was normal. Wall thickness was normal. Systolic function was normal. The estimated ejection fraction was in the range of 60% to 65%. Wall motion was normal; there were no regional wall motion abnormalities. Features are consistent with a pseudonormal left ventricular filling pattern, with concomitant abnormal relaxation and increased filling pressure (grade 2 diastolic dysfunction). - Ventricular septum: Septal motion showed paradox. These changes are consistent with right ventricular pacing. - Mitral valve: Calcified annulus. Moderately thickened leaflets . Moderate myxomatous degeneration. Moderate, holosystolicprolapse, involving the posterior leaflet. There was moderate to severe regurgitation directed eccentrically and anteriorly. - Left atrium: The atrium was mildly dilated. - Atrial septum: No defect or patent foramen ovale was identified. - Tricuspid valve: Mild  prolapse. There was mild-moderate regurgitation directed centrally. - Pulmonary arteries: PA peak pressure: 33 mm Hg (S).  ------------------------------------------------------------------- Labs, prior tests, procedures, and surgery: Permanent pacemaker system implantation.  Transthoracic echocardiography. M-mode, complete 2D, spectral Doppler, and color Doppler. Birthdate: Patient birthdate: 1941-05-06. Age: Patient is 76 yr old. Sex: Gender: female. BMI: 20.5 kg/m^2. Patient status: Outpatient. Study date: Study date: 12/15/2015. Study time: 09:55 AM. Location: [facility]  -------------------------------------------------------------------  -------------------------------------------------------------------  Left ventricle: The cavity size was normal. Wall thickness was normal. Systolic function was normal. The estimated ejection fraction was in the range of 60% to 65%. Wall motion was normal; there were no regional wall motion abnormalities. Features are consistent with a pseudonormal left ventricular filling pattern, with concomitant abnormal relaxation and increased filling pressure (grade 2 diastolic dysfunction).  ------------------------------------------------------------------- Aortic valve: Mildly thickened leaflets. Cusp separation was normal. Sclerosis without stenosis. Doppler: Transvalvular velocity was within the normal range. There was no stenosis. There was no regurgitation.  ------------------------------------------------------------------- Aorta: Aortic root: The aortic root was normal in size. Ascending aorta: The ascending aorta was normal in size. Aortic arch: The aortic arch had moderate diffuse disease.  ------------------------------------------------------------------- Mitral valve: Calcified annulus. Moderately thickened leaflets . Moderate myxomatous degeneration. Moderate, holosystolicprolapse, involving the  posterior leaflet. Doppler: There was no evidence for stenosis. There was moderate to severe regurgitation directed eccentrically and anteriorly. Peak gradient (D): 5 mm Hg.  ------------------------------------------------------------------- Left atrium: The atrium was mildly dilated.  ------------------------------------------------------------------- Atrial septum: No defect or patent foramen ovale was identified.  ------------------------------------------------------------------- Right ventricle: The cavity size was normal. Pacer wire or catheter noted in right ventricle. Systolic function was normal.  ------------------------------------------------------------------- Ventricular septum: Septal motion showed paradox. These changes are consistent with right ventricular pacing.  ------------------------------------------------------------------- Pulmonic valve: Poorly visualized. The valve appears to be grossly normal.  ------------------------------------------------------------------- Tricuspid valve: Leaflet separation was normal. Mild prolapse. Doppler: Transvalvular velocity was within the normal range. There was mild-moderate regurgitation directed centrally.  ------------------------------------------------------------------- Pulmonary artery: Systolic pressure was at the upper limits of normal.  ------------------------------------------------------------------- Right atrium: The atrium was normal in size. Pacer wire or catheter noted in right atrium.  ------------------------------------------------------------------- Pericardium: There was no pericardial effusion.  ------------------------------------------------------------------- Measurements  Left ventricle Value Reference LV ID, ED, PLAX chordal 48.46 mm 43 - 52 LV ID, ES, PLAX chordal (L) 22.66 mm 23 -  38 LV fx shortening, PLAX chordal 49 % >=29 LV PW thickness, ED 11.2 mm --------- IVS/LV PW ratio, ED 1.12 <=1.3 Stroke volume, 2D 61 ml --------- Stroke volume/bsa, 2D 37 ml/m^2 --------- LV e&', lateral 7.68 cm/s --------- LV E/e&', lateral 15.23 --------- LV e&', medial 8.33 cm/s --------- LV E/e&', medial 14.05 --------- LV e&', average 8.01 cm/s --------- LV E/e&', average 14.62 ---------  Ventricular septum Value Reference IVS thickness, ED 12.5 mm ---------  LVOT Value Reference LVOT ID, S 21 mm --------- LVOT area 3.46 cm^2 --------- LVOT ID 21 mm --------- LVOT peak velocity, S 67.7 cm/s --------- LVOT mean velocity, S 49.4 cm/s --------- LVOT VTI, S 17.7 cm --------- LVOT peak gradient, S 2 mm Hg --------- Stroke volume (SV), LVOT DP 61.3 ml --------- Stroke index (SV/bsa), LVOT DP 36.9 ml/m^2 ---------  Aorta Value Reference Aortic root ID, ED 34 mm --------- Ascending aorta ID, A-P, S 34 mm ---------  Left atrium Value Reference LA ID, A-P, ES 38 mm --------- LA ID/bsa, A-P (H) 2.29 cm/m^2 <=2.2 LA volume, S 56 ml --------- LA volume/bsa, S  33.7 ml/m^2 --------- LA volume, ES, 1-p A4C 44 ml --------- LA volume/bsa, ES, 1-p A4C 26.5 ml/m^2 --------- LA volume, ES, 1-p A2C 66 ml --------- LA volume/bsa, ES, 1-p A2C 39.7 ml/m^2 ---------  Mitral valve Value Reference Mitral E-wave peak velocity 117 cm/s --------- Mitral A-wave peak velocity 89.4 cm/s --------- Mitral deceleration time (H) 261 ms 150 - 230 Mitral peak gradient, D 5 mm Hg --------- Mitral E/A ratio, peak 1.3 ---------  Pulmonary arteries Value Reference PA pressure,  S, DP (H) 33 mm Hg <=30  Tricuspid valve Value Reference Tricuspid regurg peak velocity 274 cm/s --------- Tricuspid peak RV-RA gradient 30 mm Hg ---------  Systemic veins Value Reference Estimated CVP 3 mm Hg ---------  Right ventricle Value Reference RV ID, minor axis, ED, A4C 35.6 mm 26 - 43 RV pressure, S, DP (H) 33 mm Hg <=30 RV s&', lateral, S 13.5 cm/s ---------  Legend: (L) and (H) mark values outside specified reference range.  ------------------------------------------------------------------- Prepared and Electronically Authenticated by  Thurmon Fair, MD 2017-06-08T10:56:41   Transthoracic Echocardiography  Patient: Jahzaria, Vary MR #: [id number] Study Date: 12/27/2016 Gender: F Age: 54 Height: 171.5 cm Weight: 62.1 kg BSA: 1.72 m^2 Pt. Status: Room:  ORDERING Armanda Magic, MD REFERRING Armanda Magic, MD ATTENDING  Marca Ancona, M.D. SONOGRAPHER Dewitt Hoes, RDCS PERFORMING [facility], Outpatient  cc:  ------------------------------------------------------------------- LV EF: 55% - 60%  ------------------------------------------------------------------- Indications: Mitral regurgitation (I34.0).  ------------------------------------------------------------------- History: PMH: Mitral valve prolapse. Risk factors: PAC. RBBB. AV block. Former tobacco use. Hypertension.  ------------------------------------------------------------------- Study Conclusions  - Left ventricle: The cavity size was normal. Wall thickness was normal. Systolic function was normal. The estimated ejection fraction was in the range of 55% to 60%. Wall motion was normal; there were no regional wall motion abnormalities. Features are consistent with a pseudonormal left ventricular filling pattern, with concomitant abnormal relaxation and increased filling pressure (grade 2 diastolic dysfunction). - Aortic valve: There was no stenosis. - Aorta: Borderline dilated aortic root. Aortic root dimension: 37 mm (ED). - Mitral valve: Mildly calcified annulus. Severe prolapse of the posterior mitral valve leaflet with eccentric, anteriorly-directed mitral regurgitation. Mitral regurgitation looked moderate on this study but may not have been fully visualized. - Right ventricle: The cavity size was normal. Pacer wire or catheter noted in right ventricle. Systolic function was normal. - Tricuspid valve: There was mild-moderate regurgitation. - Pulmonary arteries: PA peak pressure: 28 mm Hg (S). - Inferior vena cava: The vessel was normal in size. The respirophasic diameter changes were in the normal range (>= 50%), consistent with normal central venous pressure.  Impressions:  - Normal LV size with EF 60-65%. Moderate diastolic dysfunction. Normal RV size and systolic  function. Severe prolapse of the posterior mitral valve leaflet wtih eccentric, anteriorly-directed mitral regurgitation. The mitral regurgitation appeared moderate on this study but may not be fully visualized. Would consider TEE if she has symptoms concerning for severe MR.  ------------------------------------------------------------------- Labs, prior tests, procedures, and surgery: Transthoracic echocardiography (07/11/2016). The mitral valve showed severe regurgitation. EF was 55%.  ------------------------------------------------------------------- Study data: Comparison was made to the study of 07/11/2016. Study status: Routine. Procedure: The patient reported no pain pre or post test. Transthoracic echocardiography. Image quality was adequate. Study completion: There were no complications. Transthoracic echocardiography. M-mode, complete 2D, spectral Doppler, and color Doppler. Birthdate: Patient birthdate: 1940/08/27. Age: Patient is 76 yr old. Sex: Gender: female. BMI: 21.1 kg/m^2. Blood pressure: 128/50 Patient status: Outpatient. Study date: Study date: 12/27/2016. Study time: 10:17 AM. Location: [facility]  -------------------------------------------------------------------  ------------------------------------------------------------------- Left ventricle: The cavity size was normal. Wall thickness was normal. Systolic function was normal. The estimated ejection fraction was in the range of 55% to 60%. Wall motion was normal; there were no regional wall motion abnormalities. Features are consistent with a pseudonormal left ventricular filling pattern, with concomitant abnormal relaxation and increased filling pressure (grade 2 diastolic dysfunction).  ------------------------------------------------------------------- Aortic valve: Trileaflet. Doppler: There was no stenosis. There was no  regurgitation.  -------------------------------------------------------------------  Aorta: Borderline dilated aortic root.  ------------------------------------------------------------------- Mitral valve: Mildly calcified annulus. Doppler: There was no evidence for stenosis. Severe prolapse of the posterior mitral valve leaflet with eccentric, anteriorly-directed mitral regurgitation. Mitral regurgitation looked moderate on this study but may not have been fully visualized. Valve area by pressure half-time: 4.31 cm^2. Indexed valve area by pressure half-time: 2.51 cm^2/m^2. Peak gradient (D): 4 mm Hg.  ------------------------------------------------------------------- Left atrium: The atrium was normal in size.  ------------------------------------------------------------------- Right ventricle: The cavity size was normal. Pacer wire or catheter noted in right ventricle. Systolic function was normal.  ------------------------------------------------------------------- Pulmonic valve: Structurally normal valve. Cusp separation was normal. Doppler: Transvalvular velocity was within the normal range. There was trivial regurgitation.  ------------------------------------------------------------------- Tricuspid valve: Doppler: There was mild-moderate regurgitation.  ------------------------------------------------------------------- Right atrium: The atrium was normal in size.  ------------------------------------------------------------------- Pericardium: There was no pericardial effusion.  ------------------------------------------------------------------- Systemic veins: Inferior vena cava: The vessel was normal in size. The respirophasic diameter changes were in the normal range (>= 50%), consistent with normal central venous pressure.  ------------------------------------------------------------------- Post procedure  conclusions Ascending Aorta:  - Borderline dilated aortic root.  ------------------------------------------------------------------- Measurements  Left ventricle Value Reference LV ID, ED, PLAX chordal (L) 41 mm 43 - 52 LV ID, ES, PLAX chordal 27 mm 23 - 38 LV fx shortening, PLAX chordal 34 % >=29 LV PW thickness, ED 11 mm --------- IVS/LV PW ratio, ED 0.91 <=1.3 Stroke volume, 2D 56 ml --------- Stroke volume/bsa, 2D 33 ml/m^2 --------- LV e&', lateral 8.44 cm/s --------- LV E/e&', lateral 11.43 --------- LV e&', medial 4.95 cm/s --------- LV E/e&', medial 19.49 --------- LV e&', average 6.7 cm/s --------- LV E/e&', average 14.41 ---------  Ventricular septum Value Reference IVS thickness, ED 10 mm ---------  LVOT Value Reference LVOT ID, S 21 mm --------- LVOT area 3.46 cm^2 --------- LVOT peak velocity, S 79.1 cm/s --------- LVOT mean velocity, S 43.6 cm/s --------- LVOT VTI, S 16.2 cm ---------  Aorta Value Reference Aortic root ID, ED 37 mm ---------  Left atrium Value Reference LA ID, A-P, ES 35 mm --------- LA ID/bsa, A-P 2.04 cm/m^2 <=2.2 LA  volume, S 41.1 ml --------- LA volume/bsa, S 23.9 ml/m^2 --------- LA volume, ES, 1-p A4C 44.6 ml --------- LA volume/bsa, ES, 1-p A4C 26 ml/m^2 --------- LA volume, ES, 1-p A2C 35.7 ml --------- LA volume/bsa, ES, 1-p A2C 20.8 ml/m^2 ---------  Mitral valve Value Reference Mitral E-wave peak velocity 96.5 cm/s --------- Mitral A-wave peak velocity 80 cm/s --------- Mitral deceleration time 173 ms 150 - 230 Mitral pressure half-time 51 ms --------- Mitral peak gradient, D 4 mm Hg --------- Mitral E/A ratio, peak 1.2 --------- Mitral valve area, PHT, DP 4.31 cm^2 --------- Mitral valve area/bsa, PHT, DP 2.51 cm^2/m^2 --------- Mitral regurg VTI, PISA 176 cm ---------  Pulmonary arteries Value Reference PA pressure, S, DP 28 mm Hg <=30  Systemic veins Value Reference Estimated CVP 3 mm Hg ---------  Right ventricle Value Reference TAPSE 22 mm --------- RV s&', lateral, S 9.73 cm/s ---------  Legend: (L) and (H) mark values outside specified reference range.  ------------------------------------------------------------------- Prepared and Electronically Authenticated by  Marca Ancona, M.D. 2018-06-21T16:58:51   Transesophageal Echocardiography  Patient: Linley, Moskal MR #: [id number] Study Date: 03/18/2017 Gender: F Age: 69 Height: 167.6 cm Weight:  63.5 kg BSA: 1.72 m^2 Pt. Status: Room:  Debria Garret, Lacey Jensen SONOGRAPHER Leta Jungling, RDCS ADMITTING [name], MD ATTENDING [name], MD PERFORMING [name], MD  cc:  -------------------------------------------------------------------  LV EF: 55% - 60%  ------------------------------------------------------------------- Indications: Mitral regurgitation 424.0.  ------------------------------------------------------------------- History: PMH: second Degree AV Block/ Lower Extremity Edema. Dyspnea. Atrial fibrillation. Mitral valve disease. Risk factors: Hypertension.  ------------------------------------------------------------------- Study Conclusions  - Left ventricle: Systolic function was normal. The estimated ejection fraction was in the range of 55% to 60%. Wall motion was normal; there were no regional wall motion abnormalities. - Aortic valve: There was no significant regurgitation. - Mitral valve: Moderate prolapse, involving the middle scallop of the posterior leaflet. Regurgitant volume (PISA): 64 ml. - Left atrium: No evidence of thrombus in the atrial cavity or appendage. No evidence of thrombus in the atrial cavity or appendage. - Right ventricle: The cavity size was normal. Wall thickness was normal. Systolic function was normal. - Right atrium: No evidence of thrombus in the atrial cavity or appendage. - Atrial septum: There appeared to be a small patent foramen ovale by color flow Doppler. However, the saline microcavitation study was negative both at rest and with abdominal pressure, indicating that there is no right to left flow. - Tricuspid valve: There was no significant regurgitation.  ------------------------------------------------------------------- Labs, prior tests, procedures, and surgery: Permanent pacemaker system  implantation.  ------------------------------------------------------------------- Study data: Study status: Routine. Consent: The risks, benefits, and alternatives to the procedure were explained to the patient and informed consent was obtained. Procedure: The patient reported no pain pre or post test. Initial setup. The patient was brought to the laboratory. Surface ECG leads were monitored. Sedation. Conscious sedation was administered. Transesophageal echocardiography. A transesophageal probe was inserted by the attending cardiologistwithout difficulty. Image quality was adequate. Study completion: The patient tolerated the procedure well. There were no complications. Administered medications: Fentanyl, . Midazolam, . Diagnostic transesophageal echocardiography. 2D and color Doppler. Birthdate: Patient birthdate: August 09, 1940. Age: Patient is 76 yr old. Sex: Gender: female. BMI: 22.6 kg/m^2. Blood pressure: 128/76 Patient status: Inpatient. Study date: Study date: 03/18/2017. Study time: 08:41 AM. Location: Endoscopy.  -------------------------------------------------------------------  ------------------------------------------------------------------- Left ventricle: Systolic function was normal. The estimated ejection fraction was in the range of 55% to 60%. Wall motion was normal; there were no regional wall motion abnormalities.  ------------------------------------------------------------------- Aortic valve: Structurally normal valve. Trileaflet; normal thickness leaflets. Cusp separation was normal. Doppler: There was no significant regurgitation.  ------------------------------------------------------------------- Aorta: There was mild atheromatous plaque extending to the descending aorta. There was no evidence for dissection. Aortic root: The aortic root was not dilated. Ascending aorta: The ascending aorta was  normal in size. Aortic arch: The aortic arch was normal in size. Descending aorta: The descending aorta was normal in size.  ------------------------------------------------------------------- Mitral valve: Leaflet separation was normal. Moderate prolapse, involving the middle scallop of the posterior leaflet. Doppler: There was regurgitation, with multiple jets directed centrally and anteriorly.  ------------------------------------------------------------------- Left atrium: The atrium was normal in size. No evidence of thrombus in the atrial cavity or appendage. No evidence of thrombus in the atrial cavity or appendage. The appendage was morphologically a left appendage, multilobulated, and of normal size. Emptying velocity was normal.  ------------------------------------------------------------------- Atrial septum: There appeared to be a small patent foramen ovale by color flow Doppler. However, the saline microcavitation study was negative both at rest and with abdominal pressure, indicating that there is no right to left flow.  ------------------------------------------------------------------- Right ventricle: The cavity size was normal. Wall thickness was normal. Pacer wire or catheter noted in right ventricle. Systolic function was normal.  ------------------------------------------------------------------- Pulmonic valve: Structurally normal valve.  ------------------------------------------------------------------- Tricuspid valve: Structurally normal valve. Leaflet separation was normal.  Doppler: There was no significant regurgitation.  ------------------------------------------------------------------- Pulmonary artery: The main pulmonary artery was normal-sized.  ------------------------------------------------------------------- Right atrium: The atrium was normal in size. No evidence of thrombus in the atrial cavity or appendage.  The appendage was morphologically a right appendage.  ------------------------------------------------------------------- Pericardium: There was no pericardial effusion.  ------------------------------------------------------------------- Measurements  Left ventricle Value Stroke volume, 2D 57 ml Stroke volume/bsa, 2D 33 ml/m^2  LVOT Value LVOT ID, S 22 mm LVOT area 3.8 cm^2 LVOT peak velocity, S 77.1 cm/s LVOT mean velocity, S 44.3 cm/s LVOT VTI, S 14.9 cm Stroke volume (SV), LVOT DP 56.6 ml Stroke index (SV/bsa), LVOT DP 32.9 ml/m^2  Mitral valve Value Mitral annulus diameter, A-P 25.2 mm Mitral maximal inflow velocity, PISA 706.62 cm/s Mitral annulus VTI, D 223.6 cm Mitral transvalvular flow, D 1698.6 ml Mitral maximal regurg velocity, PISA 711.26 cm/s Mitral regurg VTI, PISA 223.89 cm Mitral ERO, PISA 0.2 cm^2 Mitral regurg volume, PISA 64 ml Mitral regurg fraction, PISA 53.05 %  Legend: (L) and (H) mark values outside specified reference range.  ------------------------------------------------------------------- Prepared and Electronically Authenticated by  [name], MD 2018-09-10T22:12:58   RIGHT/LEFT HEART CATH AND CORONARY ANGIOGRAPHY  Conclusion     The left ventricular systolic function is normal.  Essentially normal right heart cath numbers with mildly elevated LVEDP and PCWP. Please tell if he has a very large V wave however consistent with severe MR.  The  left ventricular ejection fraction is 55-65% by visual estimate.  There is moderate-severe (4+) mitral regurgitation.  Angiographically normal coronary arteries, but significant tortuosity  Angiographic normal coronary arteries with moderate-severe mitral regurgitation. Large V wave on PCWP.   Plan: Standard TR band removal per protocol. She'll be discharged home after bed rest and follow-up with [name].   Bryan Lemma, MD Bryan Lemma, M.D., M.S. Interventional Cardiologist   Pager # [phone] Phone # [phone] [address] [place], [place] [address]    Indications   Severe mitral regurgitation [I34.0 (ICD-10-CM)]  Procedural Details/Technique   Technical Details PCP: [name], MD CARDIOLOGIST: Armanda Magic, MD  76 year old woman with history of mitral prolapse and severe mitral regurgitation referred for right left heart catheterization as part of preop evaluation.  RIGHT/LEFT HEART CATH AND CORONARY ANGIOGRAPHY (N/A)   RIGHT RADIAL ACCESS: 6 French sheath with micropuncture Angiocath - Seldinger technique ? 3500 Units IV Heparin. ? 5Fr JR4 - RCA Angiography & LV pressures/LVGram, JL3.5 - LCA Angiography ? 60 mL contrast  RIGHT BRACHIAL AXIS: Existing IV exchanged over a wire for a 5 [place] glide sheath ? 5 Fr RHC catheter - RA-RV-PA & PCWP, pressures obtained & PA SaO2% sample Radial sheath - removed in Cath Lab, TR Band 1455 hr, 11 mmHg Brachial sheath - removed in PACU holding area, manual pressure  MEDICATIONS USED: Radial Cocktail - 3mg  Verapamil in 10 mL NS, 60 mL contrast   Estimated blood loss <50 mL.  During this procedure the patient was administered the following to achieve and maintain moderate conscious sedation: Versed mg, Fentanyl mcg, while the patient's heart rate, blood pressure, and oxygen saturation were continuously monitored.    Complications   Complications documented before study signed (03/27/2017  1:29 PM EDT)    No complications were associated with this study.  Documented by [name], MD - 03/27/2017 1:27 PM EDT    Coronary Findings   Dominance: Right  Left Main  Vessel was injected. Vessel is normal in caliber. Vessel is angiographically normal.  Left  Anterior Descending  Vessel was injected. Vessel is normal in caliber. Vessel is angiographically normal.  Ramus Intermedius  Vessel was injected. Vessel is normal in caliber. Vessel is angiographically normal.  Left Circumflex  Vessel was injected. Vessel is normal in caliber. Vessel is angiographically normal.  Right Coronary Artery  Vessel was injected. Vessel is normal in caliber. Vessel is angiographically normal.  Right Heart   Right Heart Pressures Borderline P HTN: PAP/mean 37/12/23 mmHg;  PCP mean: 18 mmHg - Large peaked V wave Elevated LV EDP consistent with volume overload. LVP/EDP: 157/11/24 mmHg; AoP/MAP: 157/65/101 mmHg FA SaO2%: 95%; PA SaO2% 69%. FICK CO/CI: 5.19 / 2.98    Right Atrium Right atrial pressure is normal. RAP mean: 7 mmH    Right Ventricle RVP/EDP: 37/4/9 mmHg    Wall Motion              Left Heart   Left Ventricle The left ventricular size is normal. The left ventricular systolic function is normal. LV end diastolic pressure is normal. The left ventricular ejection fraction is 55-65% by visual estimate. No regional wall motion abnormalities.    Mitral Valve There is severe (4+) mitral regurgitation. The annulus is calcified.    Aortic Valve There is no aortic valve stenosis. There is normal aortic valve motion.    Coronary Diagrams   Diagnostic Diagram       Implants        No implant documentation for this case.  PACS Images   Show images for CARDIAC CATHETERIZATION   Link to Procedure Log   Procedure Log    Hemo Data    Most Recent Value  RA A Wave 11 mmHg  RA V Wave 9 mmHg  RA Mean 7 mmHg  RV Systolic Pressure 37 mmHg  RV Diastolic  Pressure 4 mmHg  RV EDP 9 mmHg  PA Systolic Pressure 37 mmHg  PA Diastolic Pressure 12 mmHg  PA Mean 23 mmHg  PW A Wave 20 mmHg  PW V Wave 34 mmHg  PW Mean 18 mmHg  AO Systolic Pressure 163 mmHg  AO Diastolic Pressure 70 mmHg  AO Mean 106 mmHg  LV Systolic Pressure 143 mmHg  LV Diastolic Pressure 9 mmHg  LV EDP 16 mmHg  Arterial Occlusion Pressure Extended Systolic Pressure 165 mmHg  Arterial Occlusion Pressure Extended Diastolic Pressure 68 mmHg  Arterial Occlusion Pressure Extended Mean Pressure 107 mmHg  Left Ventricular Apex Extended Systolic Pressure 157 mmHg  Left Ventricular Apex Extended Diastolic Pressure 11 mmHg  Left Ventricular Apex Extended EDP Pressure 24 mmHg    CT ANGIOGRAPHY CHEST, ABDOMEN AND PELVIS  TECHNIQUE: Multidetector CT imaging through the chest, abdomen and pelvis was performed using the standard protocol during bolus administration of intravenous contrast. Multiplanar reconstructed images and MIPs were obtained and reviewed to evaluate the vascular anatomy.  CONTRAST: 75 mL Isovue 370  COMPARISON: None.  FINDINGS: CTA CHEST FINDINGS  Cardiovascular: Initial noncontrast CT images demonstrate no high attenuation material in the region of the aortic media to suggest an acute intramural hematoma. Following injection of intravenous contrast material, the aorta and its branches are well opacified. 2 vessel aortic arch. The right brachiocephalic and left common carotid artery share a common origin. The aortic root is within normal limits at 3.5 cm in diameter. The tubular portion of the ascending aorta is also within normal limits at 3.3 cm in diameter. The aortic arch and descending thoracic aorta are also normal in caliber. Atherosclerotic calcifications are present  in the aortic arch. A left subclavian approach cardiac rhythm maintenance device is noted. Leads terminate in the right atrium and right ventricular apex. Trace coronary  artery calcifications. Mild cardiomegaly. Slight thickening of the mitral valve cusps consistent with the clinical history of mitral valve disease. No pericardial effusion.  Mediastinum/Nodes: Unremarkable CT appearance of the thyroid gland. No suspicious mediastinal or hilar adenopathy. No soft tissue mediastinal mass. The thoracic esophagus is unremarkable.  Lungs/Pleura: Mild biapical pleuroparenchymal scarring. No pulmonary edema, pleural effusion or pneumothorax. No focal airspace consolidation. No suspicious pulmonary mass or nodule.  Musculoskeletal: No acute fracture or aggressive appearing lytic or blastic osseous lesion.  Review of the MIP images confirms the above findings.  CTA ABDOMEN AND PELVIS FINDINGS  VASCULAR  Aorta: Mild heterogeneous atherosclerotic plaque throughout the abdominal aorta. No evidence of aneurysm or dissection.  Celiac: Patent without evidence of aneurysm, dissection, vasculitis or significant stenosis. The lateral segmental branch of the left hepatic artery is replaced to the left gastric artery.  SMA: Patent without evidence of aneurysm, dissection, vasculitis or significant stenosis.  Renals: Both renal arteries are patent without evidence of aneurysm, dissection, vasculitis, fibromuscular dysplasia or significant stenosis.  IMA: Patent without evidence of aneurysm, dissection, vasculitis or significant stenosis.  Inflow: Patent without evidence of aneurysm, dissection, vasculitis or significant stenosis.  Veins: No obvious venous abnormality within the limitations of this arterial phase study.  Review of the MIP images confirms the above findings.  NON-VASCULAR  Hepatobiliary: Normal hepatic contour and morphology. Circumscribed 1.4 cm water attenuation structure centrally within hepatic segment 8 is most consistent with a simple cyst. There is a second smaller subcentimeter low-attenuation lesion in hepatic  segment 2 which is too small to characterize but also statistically likely a benign cyst. No definite solid hepatic mass. Gallbladder is unremarkable. No intra or extrahepatic biliary ductal dilatation.  Pancreas: Unremarkable. No pancreatic ductal dilatation or surrounding inflammatory changes.  Spleen: Normal in size without focal abnormality.  Adrenals/Urinary Tract: Normal adrenal glands. No hydronephrosis, nephrolithiasis or enhancing renal mass. Multiple circumscribed bilateral hypodense renal lesions all measuring less than 1 cm in diameter are too small for accurate characterization. Statistically, these are highly likely benign cysts. The bladder is relatively decompressed and unremarkable in appearance.  Stomach/Bowel: No evidence of obstruction or focal bowel wall thickening. Normal appendix in the right lower quadrant. The terminal ileum is unremarkable.  Lymphatic: No suspicious lymphadenopathy.  Reproductive: Status post hysterectomy. No adnexal masses.  Other: No abdominal wall hernia or abnormality. No abdominopelvic ascites.  Musculoskeletal: No acute fracture or aggressive appearing lytic or blastic osseous lesion. Mild levoconvex scoliosis centered at the thoracolumbar junction with associated multilevel thoracolumbar degenerative disc disease.  Review of the MIP images confirms the above findings.  IMPRESSION: CTA CHEST  1. Mildly thickened mitral valve consistent with the clinical history of mitral valvular disease. 2. Normal caliber thoracic aorta and aortic root. 3. Mild coronary artery calcifications. 4. Mild cardiomegaly. 5. Cardiac rhythm maintenance device with leads terminating in the right atrium and right ventricular apex. 6. Aortic Atherosclerosis (ICD10-170.0) 7. Mild biapical pleuroparenchymal scarring. CTA ABD/PELVIS  1. Mild scattered atherosclerotic plaque without evidence of aneurysm, dissection or significant  stenosis. 2. Variant hepatic artery anatomy noted incidentally. 3. Probable hepatic and bilateral renal cysts. 4. Surgical changes of prior hysterectomy. 5. Mild levoconvex scoliosis centered at the thoracolumbar junction with associated multilevel thoracolumbar degenerative disc disease.  Signed,  Sterling Big, MD  [facility]  [facility]  Radiology   Electronically Signed By: Malachy Moan M.D. On: 04/08/2017 15:37   Impression:  Patient has mitral valve prolapse with stage D severe symptomatic primary mitral regurgitation. I have personally reviewed the patient's recent transthoracic and transesophageal echocardiograms and diagnostic cardiac catheterization. 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 There is no obvious ruptured chordae tendineae or flail segments, but there are 2 severe jets of regurgitation coursed around the severe prolapsing segment. Left ventricular systolic function remains preserved. There is left atrial enlargement. Diagnostic cardiac catheterization is notable for the absence of significant coronary artery disease. Right heart catheterization revealed mildly elevated pulmonary artery pressures but there were large V waves on wedge tracings consistent with severe mitral regurgitation. I agree the patient would benefit from elective mitral valve repair. She might benefit from concomitant maze procedure. Risks associated with surgery should be acceptably low although mildly elevated because of the patient's age and other medical problems. She may be a reasonably good candidate for minimally invasive approach for surgery.  CT angiography reveals no contraindications to peripheral cannulation for surgery.  Plan:  The patient and her family were again counseled at length regarding the indications,  risks and potential benefits of mitral valve repair and maze procedure.  expectations for her postoperative convalescence at been discussed. The rationale for elective surgery has been explained, including a comparison between surgery and continued medical therapy with close follow-up. The likelihood of successful and durable valve repair has been discussed with particular reference to the findings of their recent echocardiogram. Based upon these findings and previous experience, I have quoted them a greater than 90percent likelihood of successful valve repair.  In the unlikely event that their valve cannot be successfully repaired, we discussed the possibility of replacing the mitral valve using a mechanical prosthesis with the attendant need for long-term anticoagulation versus the alternative of replacing it using a bioprosthetic tissue valve with its potential for late structural valve deterioration and failure, depending upon the patient's longevity.  The patient specifically requests that if the mitral valve must be replaced that it be done using a bioprosthetic tissue valve.   The relative risks and benefits of performing a maze procedure  at the time of their surgery was discussed at length, including the expected likelihood of long term freedom from recurrent symptomatic atrial fibrillation and/or atrial flutter.    The patient understands and accepts all potential risks of surgery including but not limited to risk of death, stroke or other neurologic complication, myocardial infarction, congestive heart failure, respiratory failure, renal failure, bleeding requiring transfusion and/or reexploration, arrhythmia, infection or other wound complications, pneumonia, pleural and/or pericardial effusion, pulmonary embolus, aortic dissection or other major vascular complication, or delayed complications related to valve repair or replacement including but not limited to structural valve deterioration and  failure, thrombosis, embolization, endocarditis, or paravalvular leak. Alternative surgical approaches have been discussed including a comparison between conventional sternotomy and minimally-invasive techniques. The relative risks and benefits of each have been reviewed as they pertain to the patient's specific circumstances, and all of their questions have been addressed. Specific risks potentially related to the minimally-invasive approach were discussed at length, including but not limited to risk of conversion to full or partial sternotomy, aortic dissection or other major vascular complication, unilateral acute lung injury or pulmonary edema, phrenic nerve dysfunction or paralysis, rib fracture, chronic pain, lung hernia, or lymphocele. All of their questions have been answered.  I spent in excess of 15 minutes during the conduct of this office consultation and >50% of this time involved direct face-to-face encounter with the patient for counseling and/or coordination of their care.   Salvatore Decent. Cornelius Moras, MD 04/15/2017 10:29 AM

## 2017-04-16 ENCOUNTER — Inpatient Hospital Stay (HOSPITAL_COMMUNITY): Payer: Medicare Other

## 2017-04-16 ENCOUNTER — Encounter (HOSPITAL_COMMUNITY): Payer: Self-pay | Admitting: *Deleted

## 2017-04-16 ENCOUNTER — Inpatient Hospital Stay (HOSPITAL_COMMUNITY): Payer: Medicare Other | Admitting: Emergency Medicine

## 2017-04-16 ENCOUNTER — Inpatient Hospital Stay (HOSPITAL_COMMUNITY): Payer: Medicare Other | Admitting: Anesthesiology

## 2017-04-16 ENCOUNTER — Encounter (HOSPITAL_COMMUNITY)
Admission: RE | Disposition: A | Discharge status: Rehabilitation Facility | Payer: Self-pay | Source: Ambulatory Visit | Attending: Thoracic Surgery (Cardiothoracic Vascular Surgery)

## 2017-04-16 ENCOUNTER — Inpatient Hospital Stay (HOSPITAL_COMMUNITY)
Admission: RE | Admit: 2017-04-16 | Discharge: 2017-04-26 | DRG: 219 | Disposition: A | Discharge status: Rehabilitation Facility | Payer: Medicare Other | Source: Ambulatory Visit | Attending: Thoracic Surgery (Cardiothoracic Vascular Surgery) | Admitting: Thoracic Surgery (Cardiothoracic Vascular Surgery)

## 2017-04-16 DIAGNOSIS — B370 Candidal stomatitis: Secondary | ICD-10-CM | POA: Diagnosis not present

## 2017-04-16 DIAGNOSIS — D62 Acute posthemorrhagic anemia: Secondary | ICD-10-CM | POA: Diagnosis not present

## 2017-04-16 DIAGNOSIS — E870 Hyperosmolality and hypernatremia: Secondary | ICD-10-CM | POA: Diagnosis present

## 2017-04-16 DIAGNOSIS — I77810 Thoracic aortic ectasia: Secondary | ICD-10-CM | POA: Diagnosis present

## 2017-04-16 DIAGNOSIS — D696 Thrombocytopenia, unspecified: Secondary | ICD-10-CM | POA: Diagnosis not present

## 2017-04-16 DIAGNOSIS — R011 Cardiac murmur, unspecified: Secondary | ICD-10-CM | POA: Diagnosis present

## 2017-04-16 DIAGNOSIS — Z452 Encounter for adjustment and management of vascular access device: Secondary | ICD-10-CM

## 2017-04-16 DIAGNOSIS — Z87891 Personal history of nicotine dependence: Secondary | ICD-10-CM

## 2017-04-16 DIAGNOSIS — G9340 Encephalopathy, unspecified: Secondary | ICD-10-CM | POA: Diagnosis not present

## 2017-04-16 DIAGNOSIS — Z8041 Family history of malignant neoplasm of ovary: Secondary | ICD-10-CM | POA: Diagnosis not present

## 2017-04-16 DIAGNOSIS — Z7901 Long term (current) use of anticoagulants: Secondary | ICD-10-CM

## 2017-04-16 DIAGNOSIS — E039 Hypothyroidism, unspecified: Secondary | ICD-10-CM | POA: Diagnosis present

## 2017-04-16 DIAGNOSIS — R0602 Shortness of breath: Secondary | ICD-10-CM | POA: Diagnosis not present

## 2017-04-16 DIAGNOSIS — N183 Chronic kidney disease, stage 3 unspecified: Secondary | ICD-10-CM | POA: Diagnosis present

## 2017-04-16 DIAGNOSIS — D689 Coagulation defect, unspecified: Secondary | ICD-10-CM | POA: Diagnosis present

## 2017-04-16 DIAGNOSIS — I5032 Chronic diastolic (congestive) heart failure: Secondary | ICD-10-CM | POA: Diagnosis present

## 2017-04-16 DIAGNOSIS — I340 Nonrheumatic mitral (valve) insufficiency: Secondary | ICD-10-CM | POA: Diagnosis not present

## 2017-04-16 DIAGNOSIS — Z950 Presence of cardiac pacemaker: Secondary | ICD-10-CM

## 2017-04-16 DIAGNOSIS — Z888 Allergy status to other drugs, medicaments and biological substances status: Secondary | ICD-10-CM

## 2017-04-16 DIAGNOSIS — Z7951 Long term (current) use of inhaled steroids: Secondary | ICD-10-CM

## 2017-04-16 DIAGNOSIS — E213 Hyperparathyroidism, unspecified: Secondary | ICD-10-CM | POA: Diagnosis present

## 2017-04-16 DIAGNOSIS — K589 Irritable bowel syndrome without diarrhea: Secondary | ICD-10-CM | POA: Diagnosis present

## 2017-04-16 DIAGNOSIS — I10 Essential (primary) hypertension: Secondary | ICD-10-CM | POA: Diagnosis present

## 2017-04-16 DIAGNOSIS — R001 Bradycardia, unspecified: Secondary | ICD-10-CM | POA: Diagnosis present

## 2017-04-16 DIAGNOSIS — I480 Paroxysmal atrial fibrillation: Secondary | ICD-10-CM | POA: Diagnosis present

## 2017-04-16 DIAGNOSIS — Z8679 Personal history of other diseases of the circulatory system: Secondary | ICD-10-CM

## 2017-04-16 DIAGNOSIS — M1990 Unspecified osteoarthritis, unspecified site: Secondary | ICD-10-CM | POA: Diagnosis present

## 2017-04-16 DIAGNOSIS — F05 Delirium due to known physiological condition: Secondary | ICD-10-CM | POA: Diagnosis not present

## 2017-04-16 DIAGNOSIS — Z87440 Personal history of urinary (tract) infections: Secondary | ICD-10-CM | POA: Diagnosis not present

## 2017-04-16 DIAGNOSIS — Z48812 Encounter for surgical aftercare following surgery on the circulatory system: Secondary | ICD-10-CM | POA: Diagnosis not present

## 2017-04-16 DIAGNOSIS — R7309 Other abnormal glucose: Secondary | ICD-10-CM | POA: Diagnosis not present

## 2017-04-16 DIAGNOSIS — Z91041 Radiographic dye allergy status: Secondary | ICD-10-CM

## 2017-04-16 DIAGNOSIS — Z8261 Family history of arthritis: Secondary | ICD-10-CM

## 2017-04-16 DIAGNOSIS — Z90710 Acquired absence of both cervix and uterus: Secondary | ICD-10-CM | POA: Diagnosis not present

## 2017-04-16 DIAGNOSIS — R0609 Other forms of dyspnea: Secondary | ICD-10-CM

## 2017-04-16 DIAGNOSIS — N170 Acute kidney failure with tubular necrosis: Secondary | ICD-10-CM | POA: Diagnosis present

## 2017-04-16 DIAGNOSIS — D6959 Other secondary thrombocytopenia: Secondary | ICD-10-CM | POA: Diagnosis present

## 2017-04-16 DIAGNOSIS — E785 Hyperlipidemia, unspecified: Secondary | ICD-10-CM | POA: Diagnosis present

## 2017-04-16 DIAGNOSIS — J939 Pneumothorax, unspecified: Secondary | ICD-10-CM

## 2017-04-16 DIAGNOSIS — K222 Esophageal obstruction: Secondary | ICD-10-CM | POA: Diagnosis present

## 2017-04-16 DIAGNOSIS — I130 Hypertensive heart and chronic kidney disease with heart failure and stage 1 through stage 4 chronic kidney disease, or unspecified chronic kidney disease: Secondary | ICD-10-CM | POA: Diagnosis present

## 2017-04-16 DIAGNOSIS — I509 Heart failure, unspecified: Secondary | ICD-10-CM

## 2017-04-16 DIAGNOSIS — Z8673 Personal history of transient ischemic attack (TIA), and cerebral infarction without residual deficits: Secondary | ICD-10-CM

## 2017-04-16 DIAGNOSIS — Z9889 Other specified postprocedural states: Secondary | ICD-10-CM

## 2017-04-16 DIAGNOSIS — Z5181 Encounter for therapeutic drug level monitoring: Secondary | ICD-10-CM | POA: Diagnosis not present

## 2017-04-16 DIAGNOSIS — Z823 Family history of stroke: Secondary | ICD-10-CM

## 2017-04-16 DIAGNOSIS — Z882 Allergy status to sulfonamides status: Secondary | ICD-10-CM

## 2017-04-16 DIAGNOSIS — I341 Nonrheumatic mitral (valve) prolapse: Secondary | ICD-10-CM | POA: Diagnosis present

## 2017-04-16 DIAGNOSIS — E860 Dehydration: Secondary | ICD-10-CM | POA: Diagnosis present

## 2017-04-16 DIAGNOSIS — R0600 Dyspnea, unspecified: Secondary | ICD-10-CM | POA: Diagnosis present

## 2017-04-16 DIAGNOSIS — Z8249 Family history of ischemic heart disease and other diseases of the circulatory system: Secondary | ICD-10-CM

## 2017-04-16 DIAGNOSIS — I491 Atrial premature depolarization: Secondary | ICD-10-CM | POA: Diagnosis present

## 2017-04-16 DIAGNOSIS — Z9181 History of falling: Secondary | ICD-10-CM

## 2017-04-16 DIAGNOSIS — E876 Hypokalemia: Secondary | ICD-10-CM | POA: Diagnosis present

## 2017-04-16 DIAGNOSIS — I081 Rheumatic disorders of both mitral and tricuspid valves: Principal | ICD-10-CM | POA: Diagnosis present

## 2017-04-16 DIAGNOSIS — R0789 Other chest pain: Secondary | ICD-10-CM | POA: Diagnosis not present

## 2017-04-16 DIAGNOSIS — N3010 Interstitial cystitis (chronic) without hematuria: Secondary | ICD-10-CM | POA: Diagnosis present

## 2017-04-16 DIAGNOSIS — Z79899 Other long term (current) drug therapy: Secondary | ICD-10-CM

## 2017-04-16 DIAGNOSIS — J309 Allergic rhinitis, unspecified: Secondary | ICD-10-CM | POA: Diagnosis present

## 2017-04-16 DIAGNOSIS — G8918 Other acute postprocedural pain: Secondary | ICD-10-CM | POA: Diagnosis not present

## 2017-04-16 DIAGNOSIS — Z808 Family history of malignant neoplasm of other organs or systems: Secondary | ICD-10-CM

## 2017-04-16 DIAGNOSIS — R5381 Other malaise: Secondary | ICD-10-CM | POA: Diagnosis not present

## 2017-04-16 DIAGNOSIS — R482 Apraxia: Secondary | ICD-10-CM | POA: Diagnosis not present

## 2017-04-16 DIAGNOSIS — J9811 Atelectasis: Secondary | ICD-10-CM

## 2017-04-16 DIAGNOSIS — I739 Peripheral vascular disease, unspecified: Secondary | ICD-10-CM | POA: Diagnosis present

## 2017-04-16 DIAGNOSIS — D72829 Elevated white blood cell count, unspecified: Secondary | ICD-10-CM | POA: Diagnosis not present

## 2017-04-16 HISTORY — DX: Other specified postprocedural states: Z98.890

## 2017-04-16 HISTORY — PX: MINIMALLY INVASIVE MAZE PROCEDURE: SHX6244

## 2017-04-16 HISTORY — DX: Personal history of other diseases of the circulatory system: Z86.79

## 2017-04-16 HISTORY — PX: MITRAL VALVE REPAIR: SHX2039

## 2017-04-16 HISTORY — PX: TEE WITHOUT CARDIOVERSION: SHX5443

## 2017-04-16 HISTORY — PX: CLIPPING OF ATRIAL APPENDAGE: SHX5773

## 2017-04-16 LAB — POCT I-STAT, CHEM 8
BUN: 18 mg/dL (ref 6–20)
BUN: 14 mg/dL (ref 6–20)
BUN: 17 mg/dL (ref 6–20)
BUN: 19 mg/dL (ref 6–20)
BUN: 18 mg/dL (ref 6–20)
BUN: 21 mg/dL — ABNORMAL HIGH (ref 6–20)
BUN: 19 mg/dL (ref 6–20)
BUN: 19 mg/dL (ref 6–20)
CALCIUM ION: 1.19 mmol/L (ref 1.15–1.40)
CALCIUM ION: 0.9 mmol/L — AB (ref 1.15–1.40)
CALCIUM ION: 1.06 mmol/L — AB (ref 1.15–1.40)
CHLORIDE: 100 mmol/L — AB (ref 101–111)
CHLORIDE: 97 mmol/L — AB (ref 101–111)
CREATININE: 1 mg/dL (ref 0.44–1.00)
CREATININE: 0.8 mg/dL (ref 0.44–1.00)
CREATININE: 1 mg/dL (ref 0.44–1.00)
CREATININE: 0.9 mg/dL (ref 0.44–1.00)
CREATININE: 0.9 mg/dL (ref 0.44–1.00)
Calcium, Ion: 1.14 mmol/L — ABNORMAL LOW (ref 1.15–1.40)
Calcium, Ion: 0.98 mmol/L — ABNORMAL LOW (ref 1.15–1.40)
Calcium, Ion: 1.02 mmol/L — ABNORMAL LOW (ref 1.15–1.40)
Calcium, Ion: 0.99 mmol/L — ABNORMAL LOW (ref 1.15–1.40)
Calcium, Ion: 1 mmol/L — ABNORMAL LOW (ref 1.15–1.40)
Chloride: 102 mmol/L (ref 101–111)
Chloride: 97 mmol/L — ABNORMAL LOW (ref 101–111)
Chloride: 96 mmol/L — ABNORMAL LOW (ref 101–111)
Chloride: 99 mmol/L — ABNORMAL LOW (ref 101–111)
Chloride: 99 mmol/L — ABNORMAL LOW (ref 101–111)
Chloride: 102 mmol/L (ref 101–111)
Creatinine, Ser: 1 mg/dL (ref 0.44–1.00)
Creatinine, Ser: 1 mg/dL (ref 0.44–1.00)
Creatinine, Ser: 1 mg/dL (ref 0.44–1.00)
GLUCOSE: 101 mg/dL — AB (ref 65–99)
GLUCOSE: 120 mg/dL — AB (ref 65–99)
Glucose, Bld: 109 mg/dL — ABNORMAL HIGH (ref 65–99)
Glucose, Bld: 129 mg/dL — ABNORMAL HIGH (ref 65–99)
Glucose, Bld: 124 mg/dL — ABNORMAL HIGH (ref 65–99)
Glucose, Bld: 148 mg/dL — ABNORMAL HIGH (ref 65–99)
Glucose, Bld: 122 mg/dL — ABNORMAL HIGH (ref 65–99)
Glucose, Bld: 136 mg/dL — ABNORMAL HIGH (ref 65–99)
HCT: 31 % — ABNORMAL LOW (ref 36.0–46.0)
HCT: 30 % — ABNORMAL LOW (ref 36.0–46.0)
HEMATOCRIT: 26 % — AB (ref 36.0–46.0)
HEMATOCRIT: 20 % — AB (ref 36.0–46.0)
HEMATOCRIT: 20 % — AB (ref 36.0–46.0)
HEMATOCRIT: 22 % — AB (ref 36.0–46.0)
HEMATOCRIT: 24 % — AB (ref 36.0–46.0)
HEMATOCRIT: 27 % — AB (ref 36.0–46.0)
HEMOGLOBIN: 10.2 g/dL — AB (ref 12.0–15.0)
HEMOGLOBIN: 8.8 g/dL — AB (ref 12.0–15.0)
HEMOGLOBIN: 6.8 g/dL — AB (ref 12.0–15.0)
HEMOGLOBIN: 7.5 g/dL — AB (ref 12.0–15.0)
Hemoglobin: 10.5 g/dL — ABNORMAL LOW (ref 12.0–15.0)
Hemoglobin: 6.8 g/dL — CL (ref 12.0–15.0)
Hemoglobin: 8.2 g/dL — ABNORMAL LOW (ref 12.0–15.0)
Hemoglobin: 9.2 g/dL — ABNORMAL LOW (ref 12.0–15.0)
POTASSIUM: 3.4 mmol/L — AB (ref 3.5–5.1)
POTASSIUM: 3.3 mmol/L — AB (ref 3.5–5.1)
Potassium: 2.9 mmol/L — ABNORMAL LOW (ref 3.5–5.1)
Potassium: 2.6 mmol/L — CL (ref 3.5–5.1)
Potassium: 3 mmol/L — ABNORMAL LOW (ref 3.5–5.1)
Potassium: 2.8 mmol/L — ABNORMAL LOW (ref 3.5–5.1)
Potassium: 4 mmol/L (ref 3.5–5.1)
Potassium: 3.2 mmol/L — ABNORMAL LOW (ref 3.5–5.1)
SODIUM: 143 mmol/L (ref 135–145)
SODIUM: 141 mmol/L (ref 135–145)
SODIUM: 140 mmol/L (ref 135–145)
SODIUM: 140 mmol/L (ref 135–145)
SODIUM: 140 mmol/L (ref 135–145)
SODIUM: 142 mmol/L (ref 135–145)
SODIUM: 157 mmol/L — AB (ref 135–145)
Sodium: 143 mmol/L (ref 135–145)
TCO2: 32 mmol/L (ref 22–32)
TCO2: 29 mmol/L (ref 22–32)
TCO2: 32 mmol/L (ref 22–32)
TCO2: 33 mmol/L — AB (ref 22–32)
TCO2: 30 mmol/L (ref 22–32)
TCO2: 31 mmol/L (ref 22–32)
TCO2: 25 mmol/L (ref 22–32)
TCO2: 26 mmol/L (ref 22–32)

## 2017-04-16 LAB — POCT I-STAT 4, (NA,K, GLUC, HGB,HCT)
GLUCOSE: 104 mg/dL — AB (ref 65–99)
GLUCOSE: 112 mg/dL — AB (ref 65–99)
Glucose, Bld: 134 mg/dL — ABNORMAL HIGH (ref 65–99)
HCT: 26 % — ABNORMAL LOW (ref 36.0–46.0)
HEMATOCRIT: 36 % (ref 36.0–46.0)
HEMATOCRIT: 29 % — AB (ref 36.0–46.0)
Hemoglobin: 12.2 g/dL (ref 12.0–15.0)
Hemoglobin: 8.8 g/dL — ABNORMAL LOW (ref 12.0–15.0)
Hemoglobin: 9.9 g/dL — ABNORMAL LOW (ref 12.0–15.0)
POTASSIUM: 2.8 mmol/L — AB (ref 3.5–5.1)
POTASSIUM: 3.1 mmol/L — AB (ref 3.5–5.1)
Potassium: 3.1 mmol/L — ABNORMAL LOW (ref 3.5–5.1)
SODIUM: 144 mmol/L (ref 135–145)
SODIUM: 144 mmol/L (ref 135–145)
Sodium: 143 mmol/L (ref 135–145)

## 2017-04-16 LAB — POCT I-STAT 3, ART BLOOD GAS (G3+)
ACID-BASE DEFICIT: 2 mmol/L (ref 0.0–2.0)
ACID-BASE EXCESS: 9 mmol/L — AB (ref 0.0–2.0)
ACID-BASE EXCESS: 12 mmol/L — AB (ref 0.0–2.0)
ACID-BASE EXCESS: 3 mmol/L — AB (ref 0.0–2.0)
ACID-BASE EXCESS: 1 mmol/L (ref 0.0–2.0)
Acid-Base Excess: 4 mmol/L — ABNORMAL HIGH (ref 0.0–2.0)
BICARBONATE: 23.8 mmol/L (ref 20.0–28.0)
Bicarbonate: 32.5 mmol/L — ABNORMAL HIGH (ref 20.0–28.0)
Bicarbonate: 36.4 mmol/L — ABNORMAL HIGH (ref 20.0–28.0)
Bicarbonate: 27.1 mmol/L (ref 20.0–28.0)
Bicarbonate: 27.5 mmol/L (ref 20.0–28.0)
Bicarbonate: 26 mmol/L (ref 20.0–28.0)
Bicarbonate: 22.4 mmol/L (ref 20.0–28.0)
O2 SAT: 100 %
O2 SAT: 100 %
O2 SAT: 98 %
O2 SAT: 100 %
O2 SAT: 91 %
O2 Saturation: 100 %
O2 Saturation: 88 %
PCO2 ART: 45.2 mmHg (ref 32.0–48.0)
PCO2 ART: 36.5 mmHg (ref 32.0–48.0)
PCO2 ART: 30.7 mmHg — AB (ref 32.0–48.0)
PO2 ART: 365 mmHg — AB (ref 83.0–108.0)
PO2 ART: 176 mmHg — AB (ref 83.0–108.0)
PO2 ART: 50 mmHg — AB (ref 83.0–108.0)
PO2 ART: 84 mmHg (ref 83.0–108.0)
TCO2: 34 mmol/L — ABNORMAL HIGH (ref 22–32)
TCO2: 38 mmol/L — ABNORMAL HIGH (ref 22–32)
TCO2: 28 mmol/L (ref 22–32)
TCO2: 29 mmol/L (ref 22–32)
TCO2: 25 mmol/L (ref 22–32)
TCO2: 27 mmol/L (ref 22–32)
TCO2: 23 mmol/L (ref 22–32)
pCO2 arterial: 39.6 mmHg (ref 32.0–48.0)
pCO2 arterial: 35.7 mmHg (ref 32.0–48.0)
pCO2 arterial: 31 mmHg — ABNORMAL LOW (ref 32.0–48.0)
pCO2 arterial: 40.9 mmHg (ref 32.0–48.0)
pH, Arterial: 7.522 — ABNORMAL HIGH (ref 7.350–7.450)
pH, Arterial: 7.514 — ABNORMAL HIGH (ref 7.350–7.450)
pH, Arterial: 7.485 — ABNORMAL HIGH (ref 7.350–7.450)
pH, Arterial: 7.489 — ABNORMAL HIGH (ref 7.350–7.450)
pH, Arterial: 7.487 — ABNORMAL HIGH (ref 7.350–7.450)
pH, Arterial: 7.411 (ref 7.350–7.450)
pH, Arterial: 7.457 — ABNORMAL HIGH (ref 7.350–7.450)
pO2, Arterial: 385 mmHg — ABNORMAL HIGH (ref 83.0–108.0)
pO2, Arterial: 284 mmHg — ABNORMAL HIGH (ref 83.0–108.0)
pO2, Arterial: 48 mmHg — ABNORMAL LOW (ref 83.0–108.0)

## 2017-04-16 LAB — CBC
HEMATOCRIT: 28.6 % — AB (ref 36.0–46.0)
HEMATOCRIT: 32.3 % — AB (ref 36.0–46.0)
HEMOGLOBIN: 9.6 g/dL — AB (ref 12.0–15.0)
HEMOGLOBIN: 10.9 g/dL — AB (ref 12.0–15.0)
MCH: 29.9 pg (ref 26.0–34.0)
MCH: 28.6 pg (ref 26.0–34.0)
MCHC: 33.6 g/dL (ref 30.0–36.0)
MCHC: 33.7 g/dL (ref 30.0–36.0)
MCV: 89.1 fL (ref 78.0–100.0)
MCV: 84.8 fL (ref 78.0–100.0)
Platelets: 77 10*3/uL — ABNORMAL LOW (ref 150–400)
Platelets: 78 10*3/uL — ABNORMAL LOW (ref 150–400)
RBC: 3.21 MIL/uL — ABNORMAL LOW (ref 3.87–5.11)
RBC: 3.81 MIL/uL — AB (ref 3.87–5.11)
RDW: 14.4 % (ref 11.5–15.5)
RDW: 14.8 % (ref 11.5–15.5)
WBC: 12.2 10*3/uL — AB (ref 4.0–10.5)
WBC: 10.8 10*3/uL — AB (ref 4.0–10.5)

## 2017-04-16 LAB — PLATELET COUNT: Platelets: 123 10*3/uL — ABNORMAL LOW (ref 150–400)

## 2017-04-16 LAB — PREPARE RBC (CROSSMATCH)

## 2017-04-16 LAB — PROTIME-INR
INR: 1.78
INR: 1.66
INR: 0.87
Prothrombin Time: 20.6 seconds — ABNORMAL HIGH (ref 11.4–15.2)
Prothrombin Time: 19.5 seconds — ABNORMAL HIGH (ref 11.4–15.2)
Prothrombin Time: 11.8 seconds (ref 11.4–15.2)

## 2017-04-16 LAB — HEMOGLOBIN AND HEMATOCRIT, BLOOD
HCT: 20.6 % — ABNORMAL LOW (ref 36.0–46.0)
HEMOGLOBIN: 6.7 g/dL — AB (ref 12.0–15.0)

## 2017-04-16 LAB — BASIC METABOLIC PANEL
ANION GAP: 7 (ref 5–15)
BUN: 16 mg/dL (ref 6–20)
CALCIUM: 7.4 mg/dL — AB (ref 8.9–10.3)
CO2: 23 mmol/L (ref 22–32)
CREATININE: 1.17 mg/dL — AB (ref 0.44–1.00)
Chloride: 109 mmol/L (ref 101–111)
GFR, EST AFRICAN AMERICAN: 50 mL/min — AB (ref >60)
GFR, EST NON AFRICAN AMERICAN: 43 mL/min — AB (ref >60)
Glucose, Bld: 139 mg/dL — ABNORMAL HIGH (ref 65–99)
Potassium: 3 mmol/L — ABNORMAL LOW (ref 3.5–5.1)
SODIUM: 139 mmol/L (ref 135–145)

## 2017-04-16 LAB — CBC WITH DIFFERENTIAL/PLATELET
BASOS ABS: 0 10*3/uL (ref 0.0–0.1)
Basophils Relative: 0 %
Eosinophils Absolute: 0.1 10*3/uL (ref 0.0–0.7)
Eosinophils Relative: 1 %
HEMATOCRIT: 19.7 % — AB (ref 36.0–46.0)
HEMOGLOBIN: 6.7 g/dL — AB (ref 12.0–15.0)
LYMPHS PCT: 6 %
Lymphs Abs: 0.5 10*3/uL — ABNORMAL LOW (ref 0.7–4.0)
MCH: 29.9 pg (ref 26.0–34.0)
MCHC: 34 g/dL (ref 30.0–36.0)
MCV: 87.9 fL (ref 78.0–100.0)
Monocytes Absolute: 0.7 10*3/uL (ref 0.1–1.0)
Monocytes Relative: 8 %
NEUTROS ABS: 8.4 10*3/uL — AB (ref 1.7–7.7)
NEUTROS PCT: 86 %
Platelets: 97 10*3/uL — ABNORMAL LOW (ref 150–400)
RBC: 2.24 MIL/uL — AB (ref 3.87–5.11)
RDW: 14.7 % (ref 11.5–15.5)
WBC: 9.7 10*3/uL (ref 4.0–10.5)

## 2017-04-16 LAB — FIBRINOGEN
FIBRINOGEN: 179 mg/dL — AB (ref 210–475)
Fibrinogen: 311 mg/dL (ref 210–475)

## 2017-04-16 LAB — APTT
APTT: 46 s — AB (ref 24–36)
APTT: 46 s — AB (ref 24–36)
APTT: 35 s (ref 24–36)

## 2017-04-16 LAB — GLUCOSE, CAPILLARY: Glucose-Capillary: 128 mg/dL — ABNORMAL HIGH (ref 65–99)

## 2017-04-16 SURGERY — REPAIR, MITRAL VALVE, MINIMALLY INVASIVE
Anesthesia: General | Site: Chest | Laterality: Right

## 2017-04-16 MED ORDER — INSULIN REGULAR BOLUS VIA INFUSION
0.0000 [IU] | Freq: Three times a day (TID) | INTRAVENOUS | Status: DC
  Filled 2017-04-16: qty 10

## 2017-04-16 MED ORDER — LEVALBUTEROL HCL 0.63 MG/3ML IN NEBU
0.6300 mg | INHALATION_SOLUTION | Freq: Four times a day (QID) | RESPIRATORY_TRACT | Status: DC
  Administered 2017-04-16: 0.63 mg via RESPIRATORY_TRACT
  Filled 2017-04-16: qty 3

## 2017-04-16 MED ORDER — VANCOMYCIN HCL IN DEXTROSE 1-5 GM/200ML-% IV SOLN
1000.0000 mg | Freq: Once | INTRAVENOUS | Status: DC
  Filled 2017-04-16: qty 200

## 2017-04-16 MED ORDER — FENTANYL CITRATE (PF) 250 MCG/5ML IJ SOLN
INTRAMUSCULAR | Status: AC
  Filled 2017-04-16: qty 5

## 2017-04-16 MED ORDER — LIDOCAINE 2% (20 MG/ML) 5 ML SYRINGE
INTRAMUSCULAR | Status: AC
  Filled 2017-04-16: qty 5

## 2017-04-16 MED ORDER — MAGNESIUM SULFATE 4 GM/100ML IV SOLN
4.0000 g | Freq: Once | INTRAVENOUS | Status: AC
  Administered 2017-04-16: 4 g via INTRAVENOUS
  Filled 2017-04-16: qty 100

## 2017-04-16 MED ORDER — MAGNESIUM SULFATE 4 GM/100ML IV SOLN: 4.0000 g | Freq: Once | INTRAVENOUS | Status: AC

## 2017-04-16 MED ORDER — ACETAMINOPHEN 650 MG RE SUPP: 650.0000 mg | Freq: Once | RECTAL | Status: AC

## 2017-04-16 MED ORDER — ASPIRIN 81 MG PO CHEW
324.0000 mg | CHEWABLE_TABLET | Freq: Every day | ORAL | Status: DC
  Filled 2017-04-16: qty 4

## 2017-04-16 MED ORDER — METOPROLOL TARTRATE 12.5 MG HALF TABLET: 12.5000 mg | ORAL_TABLET | Freq: Two times a day (BID) | ORAL | Status: DC

## 2017-04-16 MED ORDER — DEXMEDETOMIDINE HCL 200 MCG/2ML IV SOLN
0.0000 ug/kg/h | INTRAVENOUS | Status: DC
  Administered 2017-04-17 (×2): 0.7 ug/kg/h via INTRAVENOUS
  Filled 2017-04-16 (×3): qty 2

## 2017-04-16 MED ORDER — METOPROLOL TARTRATE 25 MG/10 ML ORAL SUSPENSION: 12.5000 mg | Freq: Two times a day (BID) | ORAL | Status: DC

## 2017-04-16 MED ORDER — PROPOFOL 10 MG/ML IV BOLUS
INTRAVENOUS | Status: DC | PRN
  Administered 2017-04-16: 10 mg via INTRAVENOUS

## 2017-04-16 MED ORDER — SODIUM CHLORIDE 0.9 % IV SOLN
0.0000 ug/min | INTRAVENOUS | Status: DC
  Filled 2017-04-16: qty 2

## 2017-04-16 MED ORDER — TRAMADOL HCL 50 MG PO TABS: 50.0000 mg | ORAL_TABLET | ORAL | Status: DC | PRN

## 2017-04-16 MED ORDER — BUPIVACAINE HCL (PF) 0.5 % IJ SOLN
INTRAMUSCULAR | Status: DC | PRN
  Administered 2017-04-16: 30 mL

## 2017-04-16 MED ORDER — POTASSIUM CHLORIDE 10 MEQ/50ML IV SOLN
10.0000 meq | INTRAVENOUS | Status: AC
  Administered 2017-04-16 (×3): 10 meq via INTRAVENOUS

## 2017-04-16 MED ORDER — BISACODYL 5 MG PO TBEC: 10.0000 mg | DELAYED_RELEASE_TABLET | Freq: Every day | ORAL | Status: DC

## 2017-04-16 MED ORDER — TRANEXAMIC ACID 1000 MG/10ML IV SOLN
INTRAVENOUS | Status: DC | PRN
  Administered 2017-04-16: 1.5 mg/kg/h via INTRAVENOUS

## 2017-04-16 MED ORDER — SODIUM CHLORIDE 0.9 % IV SOLN
Freq: Once | INTRAVENOUS | Status: AC
  Administered 2017-04-16: 17:00:00 via INTRAVENOUS

## 2017-04-16 MED ORDER — MORPHINE SULFATE (PF) 4 MG/ML IV SOLN
1.0000 mg | INTRAVENOUS | Status: DC | PRN
  Administered 2017-04-16 – 2017-04-17 (×2): 4 mg via INTRAVENOUS
  Filled 2017-04-16: qty 1

## 2017-04-16 MED ORDER — ACETAMINOPHEN 160 MG/5ML PO SOLN: 1000.0000 mg | Freq: Four times a day (QID) | ORAL | Status: DC

## 2017-04-16 MED ORDER — ALBUMIN HUMAN 5 % IV SOLN
250.0000 mL | INTRAVENOUS | Status: DC | PRN
  Administered 2017-04-17: 250 mL via INTRAVENOUS

## 2017-04-16 MED ORDER — ALBUMIN HUMAN 5 % IV SOLN
250.0000 mL | INTRAVENOUS | Status: DC | PRN
  Administered 2017-04-16 (×3): 250 mL via INTRAVENOUS
  Filled 2017-04-16 (×2): qty 250

## 2017-04-16 MED ORDER — METOPROLOL TARTRATE 25 MG/10 ML ORAL SUSPENSION
12.5000 mg | Freq: Two times a day (BID) | ORAL | Status: DC
  Administered 2017-04-17: 12.5 mg
  Filled 2017-04-16: qty 5

## 2017-04-16 MED ORDER — LACTATED RINGERS IV SOLN
INTRAVENOUS | Status: DC | PRN
  Administered 2017-04-16: 07:00:00 via INTRAVENOUS

## 2017-04-16 MED ORDER — SODIUM CHLORIDE 0.9 % IV SOLN
0.0000 ug/kg/h | INTRAVENOUS | Status: DC
  Filled 2017-04-16: qty 2

## 2017-04-16 MED ORDER — MORPHINE SULFATE (PF) 4 MG/ML IV SOLN: 1.0000 mg | INTRAVENOUS | Status: DC | PRN

## 2017-04-16 MED ORDER — MORPHINE SULFATE (PF) 4 MG/ML IV SOLN
1.0000 mg | INTRAVENOUS | Status: DC | PRN
  Filled 2017-04-16: qty 1

## 2017-04-16 MED ORDER — ACETAMINOPHEN 650 MG RE SUPP
650.0000 mg | Freq: Once | RECTAL | Status: AC
  Administered 2017-04-16: 650 mg via RECTAL

## 2017-04-16 MED ORDER — PHENYLEPHRINE HCL 10 MG/ML IJ SOLN
INTRAVENOUS | Status: DC | PRN
  Administered 2017-04-16: 30 ug/min via INTRAVENOUS
  Administered 2017-04-16: 40 ug/min via INTRAVENOUS

## 2017-04-16 MED ORDER — PROPOFOL 10 MG/ML IV BOLUS
INTRAVENOUS | Status: DC | PRN
  Administered 2017-04-16: 20 mg via INTRAVENOUS

## 2017-04-16 MED ORDER — SODIUM CHLORIDE 0.9 % IV SOLN: 250.0000 mL | INTRAVENOUS | Status: DC

## 2017-04-16 MED ORDER — CHLORHEXIDINE GLUCONATE 4 % EX LIQD: 30.0000 mL | CUTANEOUS | Status: DC

## 2017-04-16 MED ORDER — LACTATED RINGERS IV SOLN
INTRAVENOUS | Status: DC | PRN
  Administered 2017-04-16: 20:00:00 via INTRAVENOUS

## 2017-04-16 MED ORDER — DOCUSATE SODIUM 100 MG PO CAPS: 200.0000 mg | ORAL_CAPSULE | Freq: Every day | ORAL | Status: DC

## 2017-04-16 MED ORDER — SODIUM CHLORIDE 0.9 % IV SOLN
INTRAVENOUS | Status: DC | PRN
  Administered 2017-04-16: .8 [IU]/h via INTRAVENOUS

## 2017-04-16 MED ORDER — SODIUM CHLORIDE 0.9% FLUSH
3.0000 mL | Freq: Two times a day (BID) | INTRAVENOUS | Status: DC
  Administered 2017-04-18 – 2017-04-20 (×3): 3 mL via INTRAVENOUS

## 2017-04-16 MED ORDER — ROCURONIUM BROMIDE 10 MG/ML (PF) SYRINGE
PREFILLED_SYRINGE | INTRAVENOUS | Status: DC | PRN
  Administered 2017-04-16 (×2): 50 mg via INTRAVENOUS

## 2017-04-16 MED ORDER — SODIUM CHLORIDE 0.9 % IJ SOLN
INTRAMUSCULAR | Status: AC
  Filled 2017-04-16: qty 10

## 2017-04-16 MED ORDER — BISACODYL 10 MG RE SUPP
10.0000 mg | Freq: Every day | RECTAL | Status: DC
  Administered 2017-04-18 – 2017-04-26 (×8): 10 mg via RECTAL
  Filled 2017-04-16 (×8): qty 1

## 2017-04-16 MED ORDER — LACTATED RINGERS IV SOLN
500.0000 mL | Freq: Once | INTRAVENOUS | Status: DC | PRN
Start: 2017-04-16 — End: 2017-04-17

## 2017-04-16 MED ORDER — SODIUM CHLORIDE 0.9 % IR SOLN
Status: DC | PRN
Start: 2017-04-16 — End: 2017-04-16
  Administered 2017-04-16: 2000 mL

## 2017-04-16 MED ORDER — DEXTROSE 5 % IV SOLN
1.5000 g | Freq: Two times a day (BID) | INTRAVENOUS | Status: DC
  Administered 2017-04-16: .75 g via INTRAVENOUS
  Filled 2017-04-16: qty 1.5

## 2017-04-16 MED ORDER — ACETAMINOPHEN 500 MG PO TABS: 1000.0000 mg | ORAL_TABLET | Freq: Four times a day (QID) | ORAL | Status: DC

## 2017-04-16 MED ORDER — NITROGLYCERIN IN D5W 200-5 MCG/ML-% IV SOLN: 0.0000 ug/min | INTRAVENOUS | Status: DC

## 2017-04-16 MED ORDER — SODIUM CHLORIDE 0.9% FLUSH: 3.0000 mL | Freq: Two times a day (BID) | INTRAVENOUS | Status: DC

## 2017-04-16 MED ORDER — ASPIRIN EC 325 MG PO TBEC: 325.0000 mg | DELAYED_RELEASE_TABLET | Freq: Every day | ORAL | Status: DC

## 2017-04-16 MED ORDER — PANTOPRAZOLE SODIUM 40 MG IV SOLR: 40.0000 mg | Freq: Two times a day (BID) | INTRAVENOUS | Status: DC

## 2017-04-16 MED ORDER — LACTATED RINGERS IV SOLN
INTRAVENOUS | Status: DC | PRN
  Administered 2017-04-16 (×2): via INTRAVENOUS

## 2017-04-16 MED ORDER — MIDAZOLAM HCL 5 MG/5ML IJ SOLN
INTRAMUSCULAR | Status: DC | PRN
  Administered 2017-04-16: 1 mg via INTRAVENOUS
  Administered 2017-04-16: 2 mg via INTRAVENOUS

## 2017-04-16 MED ORDER — 0.9 % SODIUM CHLORIDE (POUR BTL) OPTIME
TOPICAL | Status: DC | PRN
  Administered 2017-04-16: 4000 mL

## 2017-04-16 MED ORDER — SODIUM CHLORIDE 0.9 % IV SOLN
Freq: Once | INTRAVENOUS | Status: AC
  Administered 2017-04-16: 18:00:00 via INTRAVENOUS

## 2017-04-16 MED ORDER — SODIUM CHLORIDE 0.9 % IV SOLN
INTRAVENOUS | Status: DC
  Administered 2017-04-16: 23:00:00 via INTRAVENOUS

## 2017-04-16 MED ORDER — BUPIVACAINE 0.5 % ON-Q PUMP SINGLE CATH 400 ML
400.0000 mL | INJECTION | Status: DC
  Filled 2017-04-16: qty 400

## 2017-04-16 MED ORDER — HEPARIN SODIUM (PORCINE) 1000 UNIT/ML IJ SOLN
INTRAMUSCULAR | Status: AC
  Filled 2017-04-16: qty 1

## 2017-04-16 MED ORDER — ARTIFICIAL TEARS OPHTHALMIC OINT
TOPICAL_OINTMENT | OPHTHALMIC | Status: AC
  Filled 2017-04-16: qty 3.5

## 2017-04-16 MED ORDER — PROTAMINE SULFATE 10 MG/ML IV SOLN
INTRAVENOUS | Status: DC | PRN
  Administered 2017-04-16: 220 mg via INTRAVENOUS
  Administered 2017-04-16 (×2): 10 mg via INTRAVENOUS

## 2017-04-16 MED ORDER — MIDAZOLAM HCL 2 MG/2ML IJ SOLN
INTRAMUSCULAR | Status: AC
  Filled 2017-04-16: qty 2

## 2017-04-16 MED ORDER — ACETAMINOPHEN 160 MG/5ML PO SOLN
1000.0000 mg | Freq: Four times a day (QID) | ORAL | Status: DC
  Administered 2017-04-16 – 2017-04-17 (×2): 1000 mg
  Filled 2017-04-16 (×3): qty 40.6

## 2017-04-16 MED ORDER — SODIUM CHLORIDE 0.9 % IV SOLN: INTRAVENOUS | Status: DC

## 2017-04-16 MED ORDER — MIDAZOLAM HCL 2 MG/2ML IJ SOLN: 2.0000 mg | INTRAMUSCULAR | Status: DC | PRN

## 2017-04-16 MED ORDER — ONDANSETRON HCL 4 MG/2ML IJ SOLN: 4.0000 mg | Freq: Four times a day (QID) | INTRAMUSCULAR | Status: DC | PRN

## 2017-04-16 MED ORDER — LEVOTHYROXINE SODIUM 75 MCG PO TABS: 75.0000 ug | ORAL_TABLET | Freq: Every day | ORAL | Status: DC

## 2017-04-16 MED ORDER — MORPHINE SULFATE (PF) 2 MG/ML IV SOLN: 1.0000 mg | INTRAVENOUS | Status: DC | PRN

## 2017-04-16 MED ORDER — NITROGLYCERIN IN D5W 200-5 MCG/ML-% IV SOLN
0.0000 ug/min | INTRAVENOUS | Status: DC
  Administered 2017-04-18: 80 ug/min via INTRAVENOUS
  Filled 2017-04-16: qty 250

## 2017-04-16 MED ORDER — PANTOPRAZOLE SODIUM 40 MG PO TBEC: 40.0000 mg | DELAYED_RELEASE_TABLET | Freq: Every day | ORAL | Status: DC

## 2017-04-16 MED ORDER — DEXTROSE 5 % IV SOLN
1.5000 g | Freq: Two times a day (BID) | INTRAVENOUS | Status: AC
  Administered 2017-04-17 – 2017-04-18 (×4): 1.5 g via INTRAVENOUS
  Filled 2017-04-16 (×7): qty 1.5

## 2017-04-16 MED ORDER — SODIUM CHLORIDE 0.9 % IV SOLN
INTRAVENOUS | Status: DC
  Filled 2017-04-16: qty 1

## 2017-04-16 MED ORDER — CEFUROXIME SODIUM 750 MG IJ SOLR
INTRAMUSCULAR | Status: AC
  Filled 2017-04-16: qty 750

## 2017-04-16 MED ORDER — SODIUM CHLORIDE 0.9 % IV SOLN
INTRAVENOUS | Status: DC
  Administered 2017-04-16: 16:00:00 via INTRAVENOUS

## 2017-04-16 MED ORDER — LACTATED RINGERS IV SOLN: INTRAVENOUS | Status: DC

## 2017-04-16 MED ORDER — SODIUM CHLORIDE 0.9 % IV SOLN
INTRAVENOUS | Status: DC | PRN
  Administered 2017-04-16: 0.2 ug/kg/h via INTRAVENOUS

## 2017-04-16 MED ORDER — BUPIVACAINE 0.5 % ON-Q PUMP SINGLE CATH 400 ML
INJECTION | Status: AC | PRN
  Administered 2017-04-16: 400 mL

## 2017-04-16 MED ORDER — BISACODYL 10 MG RE SUPP: 10.0000 mg | Freq: Every day | RECTAL | Status: DC

## 2017-04-16 MED ORDER — FENTANYL CITRATE (PF) 100 MCG/2ML IJ SOLN
INTRAMUSCULAR | Status: DC | PRN
  Administered 2017-04-16: 100 ug via INTRAVENOUS
  Administered 2017-04-16 (×3): 50 ug via INTRAVENOUS

## 2017-04-16 MED ORDER — FENTANYL CITRATE (PF) 250 MCG/5ML IJ SOLN
INTRAMUSCULAR | Status: AC
  Filled 2017-04-16: qty 25

## 2017-04-16 MED ORDER — MIDAZOLAM HCL 5 MG/5ML IJ SOLN
INTRAMUSCULAR | Status: DC | PRN
  Administered 2017-04-16: 2 mg via INTRAVENOUS

## 2017-04-16 MED ORDER — HEPARIN SODIUM (PORCINE) 1000 UNIT/ML IJ SOLN
INTRAMUSCULAR | Status: DC | PRN
  Administered 2017-04-16: 26 mL via INTRAVENOUS

## 2017-04-16 MED ORDER — ROCURONIUM BROMIDE 10 MG/ML (PF) SYRINGE
PREFILLED_SYRINGE | INTRAVENOUS | Status: AC
  Filled 2017-04-16: qty 5

## 2017-04-16 MED ORDER — METOPROLOL TARTRATE 5 MG/5ML IV SOLN
2.5000 mg | INTRAVENOUS | Status: DC | PRN
  Administered 2017-04-17 – 2017-04-20 (×5): 5 mg via INTRAVENOUS
  Filled 2017-04-16 (×6): qty 5

## 2017-04-16 MED ORDER — COAGULATION FACTOR VIIA RECOMB 1 MG IV SOLR
45.0000 ug/kg | Freq: Once | INTRAVENOUS | Status: AC
  Administered 2017-04-16: 3000 ug via INTRAVENOUS
  Filled 2017-04-16: qty 3

## 2017-04-16 MED ORDER — PROTAMINE SULFATE 10 MG/ML IV SOLN
INTRAVENOUS | Status: AC
  Filled 2017-04-16: qty 5

## 2017-04-16 MED ORDER — BUPIVACAINE HCL (PF) 0.5 % IJ SOLN
INTRAMUSCULAR | Status: AC
  Filled 2017-04-16: qty 30

## 2017-04-16 MED ORDER — PANTOPRAZOLE SODIUM 40 MG IV SOLR
40.0000 mg | Freq: Two times a day (BID) | INTRAVENOUS | Status: AC
  Administered 2017-04-16 – 2017-04-18 (×4): 40 mg via INTRAVENOUS
  Filled 2017-04-16 (×5): qty 40

## 2017-04-16 MED ORDER — SODIUM CHLORIDE 0.9% FLUSH: 3.0000 mL | INTRAVENOUS | Status: DC | PRN

## 2017-04-16 MED ORDER — ARTIFICIAL TEARS OPHTHALMIC OINT
TOPICAL_OINTMENT | OPHTHALMIC | Status: DC | PRN
  Administered 2017-04-16: 1 via OPHTHALMIC

## 2017-04-16 MED ORDER — PROPOFOL 10 MG/ML IV BOLUS
INTRAVENOUS | Status: AC
  Filled 2017-04-16: qty 20

## 2017-04-16 MED ORDER — PROTAMINE SULFATE 10 MG/ML IV SOLN
INTRAVENOUS | Status: AC
  Filled 2017-04-16: qty 25

## 2017-04-16 MED ORDER — MIDAZOLAM HCL 10 MG/2ML IJ SOLN
INTRAMUSCULAR | Status: AC
  Filled 2017-04-16: qty 2

## 2017-04-16 MED ORDER — SODIUM CHLORIDE 0.9 % IV SOLN
Freq: Once | INTRAVENOUS | Status: AC
  Administered 2017-04-16: 20:00:00 via INTRAVENOUS

## 2017-04-16 MED ORDER — OXYCODONE HCL 5 MG PO TABS: 5.0000 mg | ORAL_TABLET | ORAL | Status: DC | PRN

## 2017-04-16 MED ORDER — SODIUM CHLORIDE 0.45 % IV SOLN: INTRAVENOUS | Status: DC | PRN

## 2017-04-16 MED ORDER — FUROSEMIDE 10 MG/ML IJ SOLN
40.0000 mg | Freq: Once | INTRAMUSCULAR | Status: AC
  Administered 2017-04-16: 40 mg via INTRAVENOUS

## 2017-04-16 MED ORDER — SODIUM CHLORIDE 0.45 % IV SOLN
INTRAVENOUS | Status: DC | PRN
  Administered 2017-04-16: 15:00:00 via INTRAVENOUS

## 2017-04-16 MED ORDER — POTASSIUM CHLORIDE 10 MEQ/50ML IV SOLN
10.0000 meq | INTRAVENOUS | Status: AC
  Administered 2017-04-16 – 2017-04-17 (×3): 10 meq via INTRAVENOUS
  Filled 2017-04-16 (×3): qty 50

## 2017-04-16 MED ORDER — METOPROLOL TARTRATE 5 MG/5ML IV SOLN: 2.5000 mg | INTRAVENOUS | Status: DC | PRN

## 2017-04-16 MED ORDER — CHLORHEXIDINE GLUCONATE 0.12 % MT SOLN
15.0000 mL | OROMUCOSAL | Status: AC
Start: 2017-04-16 — End: 2017-04-16
  Administered 2017-04-16: 15 mL via OROMUCOSAL

## 2017-04-16 MED ORDER — ACETAMINOPHEN 160 MG/5ML PO SOLN: 650.0000 mg | Freq: Once | ORAL | Status: AC

## 2017-04-16 MED ORDER — 0.9 % SODIUM CHLORIDE (POUR BTL) OPTIME
TOPICAL | Status: DC | PRN
  Administered 2017-04-16: 5000 mL

## 2017-04-16 MED ORDER — ASPIRIN EC 325 MG PO TBEC
325.0000 mg | DELAYED_RELEASE_TABLET | Freq: Every day | ORAL | Status: DC
  Administered 2017-04-19: 325 mg via ORAL
  Filled 2017-04-16: qty 1

## 2017-04-16 MED ORDER — PHENYLEPHRINE HCL 10 MG/ML IJ SOLN
INTRAVENOUS | Status: DC | PRN
  Administered 2017-04-16: 20 ug/min via INTRAVENOUS

## 2017-04-16 MED ORDER — ASPIRIN 81 MG PO CHEW: 324.0000 mg | CHEWABLE_TABLET | Freq: Every day | ORAL | Status: DC

## 2017-04-16 MED ORDER — VANCOMYCIN HCL IN DEXTROSE 1-5 GM/200ML-% IV SOLN
1000.0000 mg | Freq: Once | INTRAVENOUS | Status: AC
  Administered 2017-04-17: 1000 mg via INTRAVENOUS
  Filled 2017-04-16: qty 200

## 2017-04-16 MED ORDER — FAMOTIDINE IN NACL 20-0.9 MG/50ML-% IV SOLN: 20.0000 mg | Freq: Two times a day (BID) | INTRAVENOUS | Status: DC

## 2017-04-16 MED ORDER — ROCURONIUM BROMIDE 100 MG/10ML IV SOLN
INTRAVENOUS | Status: DC | PRN
  Administered 2017-04-16: 20 mg via INTRAVENOUS
  Administered 2017-04-16 (×3): 50 mg via INTRAVENOUS

## 2017-04-16 MED ORDER — MILRINONE LACTATE IN DEXTROSE 20-5 MG/100ML-% IV SOLN
0.2000 ug/kg/min | INTRAVENOUS | Status: DC
  Administered 2017-04-17: 0.2 ug/kg/min via INTRAVENOUS
  Filled 2017-04-16: qty 100

## 2017-04-16 MED ORDER — CHLORHEXIDINE GLUCONATE 0.12 % MT SOLN
15.0000 mL | OROMUCOSAL | Status: AC
  Administered 2017-04-16: 15 mL via OROMUCOSAL

## 2017-04-16 MED ORDER — MIDAZOLAM HCL 2 MG/2ML IJ SOLN
2.0000 mg | INTRAMUSCULAR | Status: DC | PRN
  Administered 2017-04-17: 2 mg via INTRAVENOUS
  Filled 2017-04-16: qty 2

## 2017-04-16 MED ORDER — MILRINONE LACTATE IN DEXTROSE 20-5 MG/100ML-% IV SOLN
0.2000 ug/kg/min | INTRAVENOUS | Status: DC
  Administered 2017-04-16: 0.2 ug/kg/min via INTRAVENOUS
  Filled 2017-04-16: qty 100

## 2017-04-16 MED ORDER — FENTANYL CITRATE (PF) 250 MCG/5ML IJ SOLN
INTRAMUSCULAR | Status: DC | PRN
  Administered 2017-04-16: 100 ug via INTRAVENOUS
  Administered 2017-04-16 (×4): 50 ug via INTRAVENOUS
  Administered 2017-04-16: 675 ug via INTRAVENOUS
  Administered 2017-04-16: 25 ug via INTRAVENOUS
  Administered 2017-04-16: 100 ug via INTRAVENOUS

## 2017-04-16 MED ORDER — ALBUMIN HUMAN 5 % IV SOLN
INTRAVENOUS | Status: DC | PRN
Start: 2017-04-16 — End: 2017-04-16
  Administered 2017-04-16 (×2): via INTRAVENOUS

## 2017-04-16 MED ORDER — LACTATED RINGERS IV SOLN: 500.0000 mL | Freq: Once | INTRAVENOUS | Status: DC | PRN

## 2017-04-16 MED ORDER — SODIUM CHLORIDE 0.9 % IV SOLN: Freq: Once | INTRAVENOUS | Status: AC

## 2017-04-16 SURGICAL SUPPLY — 124 items
ADAPTER CARDIO PERF ANTE/RETRO (ADAPTER) ×5 IMPLANT
ADH SKN CLS APL DERMABOND .7 (GAUZE/BANDAGES/DRESSINGS) ×8
ADPR PRFSN 84XANTGRD RTRGD (ADAPTER) ×4
ARTICLIP LAA PROCLIP II 45 (Clip) ×5 IMPLANT
BAG DECANTER FOR FLEXI CONT (MISCELLANEOUS) ×10 IMPLANT
BLADE SURG 11 STRL SS (BLADE) ×5 IMPLANT
CANISTER SUCT 3000ML PPV (MISCELLANEOUS) ×10 IMPLANT
CANNULA FEM VENOUS REMOTE 22FR (CANNULA) ×1 IMPLANT
CANNULA FEMORAL ART 14 SM (MISCELLANEOUS) ×5 IMPLANT
CANNULA GUNDRY RCSP 15FR (MISCELLANEOUS) ×5 IMPLANT
CANNULA OPTISITE PERFUSION 16F (CANNULA) IMPLANT
CANNULA OPTISITE PERFUSION 18F (CANNULA) ×1 IMPLANT
CANNULA SUMP PERICARDIAL (CANNULA) ×10 IMPLANT
CARDIOBLATE CARDIAC ABLATION (MISCELLANEOUS)
CATH KIT ON Q 5IN SLV (PAIN MANAGEMENT) IMPLANT
CATH KIT ON-Q SILVERSOAK 5 (CATHETERS) IMPLANT
CATH KIT ON-Q SILVERSOAK 5IN (CATHETERS) ×5 IMPLANT
CELLS DAT CNTRL 66122 CELL SVR (MISCELLANEOUS) ×4 IMPLANT
CLAMP OLL ABLATION (MISCELLANEOUS) ×1 IMPLANT
CLIP VESOCCLUDE MED 24/CT (CLIP) ×1 IMPLANT
CONN ST 1/4X3/8  BEN (MISCELLANEOUS) ×2
CONN ST 1/4X3/8 BEN (MISCELLANEOUS) ×8 IMPLANT
CONNECTOR 1/2X3/8X1/2 3 WAY (MISCELLANEOUS) ×1
CONNECTOR 1/2X3/8X1/2 3WAY (MISCELLANEOUS) ×4 IMPLANT
CONT SPEC 4OZ CLIKSEAL STRL BL (MISCELLANEOUS) ×5 IMPLANT
COVER BACK TABLE 24X17X13 BIG (DRAPES) ×5 IMPLANT
CRADLE DONUT ADULT HEAD (MISCELLANEOUS) ×5 IMPLANT
DERMABOND ADVANCED (GAUZE/BANDAGES/DRESSINGS) ×2
DERMABOND ADVANCED .7 DNX12 (GAUZE/BANDAGES/DRESSINGS) ×8 IMPLANT
DEVICE ATRICLIP LAA PRCLPII 45 (Clip) IMPLANT
DEVICE CARDIOBLATE CARDIAC ABL (MISCELLANEOUS) IMPLANT
DEVICE PMI PUNCTURE CLOSURE (MISCELLANEOUS) ×5 IMPLANT
DEVICE SUT CK QUICK LOAD INDV (Prosthesis & Implant Heart) ×3 IMPLANT
DEVICE SUT CK QUICK LOAD MINI (Prosthesis & Implant Heart) ×1 IMPLANT
DEVICE TROCAR PUNCTURE CLOSURE (ENDOMECHANICALS) ×5 IMPLANT
DRAIN CHANNEL 28F RND 3/8 FF (WOUND CARE) ×10 IMPLANT
DRAPE BILATERAL SPLIT (DRAPES) ×5 IMPLANT
DRAPE C-ARM 42X72 X-RAY (DRAPES) ×5 IMPLANT
DRAPE CV SPLIT W-CLR ANES SCRN (DRAPES) ×5 IMPLANT
DRAPE INCISE IOBAN 66X45 STRL (DRAPES) ×16 IMPLANT
DRAPE SLUSH/WARMER DISC (DRAPES) ×5 IMPLANT
DRSG COVADERM 4X8 (GAUZE/BANDAGES/DRESSINGS) ×5 IMPLANT
ELECT BLADE 6.5 EXT (BLADE) ×5 IMPLANT
ELECT REM PT RETURN 9FT ADLT (ELECTROSURGICAL) ×10
ELECTRODE REM PT RTRN 9FT ADLT (ELECTROSURGICAL) ×8 IMPLANT
FELT TEFLON 1X6 (MISCELLANEOUS) ×10 IMPLANT
FEMORAL VENOUS CANN RAP (CANNULA) IMPLANT
GAUZE SPONGE 4X4 12PLY STRL (GAUZE/BANDAGES/DRESSINGS) ×5 IMPLANT
GLOVE BIO SURGEON STRL SZ 6 (GLOVE) ×3 IMPLANT
GLOVE BIO SURGEON STRL SZ 6.5 (GLOVE) ×6 IMPLANT
GLOVE BIO SURGEON STRL SZ7.5 (GLOVE) ×1 IMPLANT
GLOVE ORTHO TXT STRL SZ7.5 (GLOVE) ×15 IMPLANT
GOWN STRL REUS W/ TWL LRG LVL3 (GOWN DISPOSABLE) ×16 IMPLANT
GOWN STRL REUS W/TWL LRG LVL3 (GOWN DISPOSABLE) ×55
IV NS 1000ML (IV SOLUTION) ×5
IV NS 1000ML BAXH (IV SOLUTION) IMPLANT
IV NS IRRIG 3000ML ARTHROMATIC (IV SOLUTION) ×1 IMPLANT
KIT BASIN OR (CUSTOM PROCEDURE TRAY) ×5 IMPLANT
KIT DILATOR VASC 18G NDL (KITS) ×5 IMPLANT
KIT DRAINAGE VACCUM ASSIST (KITS) ×1 IMPLANT
KIT ROOM TURNOVER OR (KITS) ×5 IMPLANT
KIT SUCTION CATH 14FR (SUCTIONS) ×5 IMPLANT
KIT SUT CK MINI COMBO 4X17 (Prosthesis & Implant Heart) ×1 IMPLANT
LEAD PACING MYOCARDI (MISCELLANEOUS) ×5 IMPLANT
LINE VENT (MISCELLANEOUS) ×1 IMPLANT
NDL AORTIC ROOT 14G 7F (CATHETERS) ×4 IMPLANT
NEEDLE AORTIC ROOT 14G 7F (CATHETERS) ×5 IMPLANT
NS IRRIG 1000ML POUR BTL (IV SOLUTION) ×25 IMPLANT
PACK OPEN HEART (CUSTOM PROCEDURE TRAY) ×5 IMPLANT
PAD ARMBOARD 7.5X6 YLW CONV (MISCELLANEOUS) ×10 IMPLANT
PAD ELECT DEFIB RADIOL ZOLL (MISCELLANEOUS) ×5 IMPLANT
PROBE CRYO2-ABLATION MALLABLE (MISCELLANEOUS) ×1 IMPLANT
RETRACTOR WND ALEXIS 18 MED (MISCELLANEOUS) IMPLANT
RING MITRAL MEMO 3D 32MM SMD32 (Prosthesis & Implant Heart) ×1 IMPLANT
RTRCTR WOUND ALEXIS 18CM MED (MISCELLANEOUS) ×5
SET CANNULATION TOURNIQUET (MISCELLANEOUS) ×5 IMPLANT
SET CARDIOPLEGIA MPS 5001102 (MISCELLANEOUS) ×1 IMPLANT
SET IRRIG TUBING LAPAROSCOPIC (IRRIGATION / IRRIGATOR) ×5 IMPLANT
SOLUTION ANTI FOG 6CC (MISCELLANEOUS) ×5 IMPLANT
SPONGE GAUZE 4X4 12PLY STER LF (GAUZE/BANDAGES/DRESSINGS) ×5 IMPLANT
SUT BONE WAX W31G (SUTURE) ×5 IMPLANT
SUT E-PACK MINIMALLY INVASIVE (SUTURE) ×5 IMPLANT
SUT ETHIBOND (SUTURE) ×2 IMPLANT
SUT ETHIBOND 2 0 SH (SUTURE) ×1 IMPLANT
SUT ETHIBOND 2 0 V4 (SUTURE) IMPLANT
SUT ETHIBOND 2 0V4 GREEN (SUTURE) IMPLANT
SUT ETHIBOND 2-0 RB-1 WHT (SUTURE) ×2 IMPLANT
SUT ETHIBOND 4 0 TF (SUTURE) IMPLANT
SUT ETHIBOND 5 0 C 1 30 (SUTURE) IMPLANT
SUT ETHIBOND NAB MH 2-0 36IN (SUTURE) IMPLANT
SUT ETHIBOND X763 2 0 SH 1 (SUTURE) ×5 IMPLANT
SUT GORETEX 6.0 TH-9 30 IN (SUTURE) IMPLANT
SUT GORETEX CV 4 TH 22 36 (SUTURE) ×5 IMPLANT
SUT GORETEX CV-5THC-13 36IN (SUTURE) ×6 IMPLANT
SUT GORETEX CV4 TH-18 (SUTURE) ×10 IMPLANT
SUT GORETEX TH-18 36 INCH (SUTURE) IMPLANT
SUT PROLENE 3 0 SH DA (SUTURE) ×1 IMPLANT
SUT PROLENE 3 0 SH1 36 (SUTURE) ×25 IMPLANT
SUT PROLENE 4 0 RB 1 (SUTURE) ×10
SUT PROLENE 4-0 RB1 .5 CRCL 36 (SUTURE) IMPLANT
SUT PTFE CHORD X 20MM (SUTURE) ×1 IMPLANT
SUT SILK  1 MH (SUTURE) ×2
SUT SILK 1 MH (SUTURE) IMPLANT
SUT SILK 2 0 SH CR/8 (SUTURE) IMPLANT
SUT SILK 3 0 SH CR/8 (SUTURE) IMPLANT
SUT VIC AB 2-0 CTX 36 (SUTURE) IMPLANT
SUT VIC AB 3-0 SH 8-18 (SUTURE) IMPLANT
SUT VICRYL 2 TP 1 (SUTURE) IMPLANT
SYR 10ML LL (SYRINGE) ×5 IMPLANT
SYSTEM SAHARA CHEST DRAIN ATS (WOUND CARE) ×10 IMPLANT
TAPE CLOTH SURG 4X10 WHT LF (GAUZE/BANDAGES/DRESSINGS) ×1 IMPLANT
TAPE PAPER 2X10 WHT MICROPORE (GAUZE/BANDAGES/DRESSINGS) ×1 IMPLANT
TOWEL GREEN STERILE (TOWEL DISPOSABLE) ×18 IMPLANT
TOWEL GREEN STERILE FF (TOWEL DISPOSABLE) ×10 IMPLANT
TOWEL OR 17X24 6PK STRL BLUE (TOWEL DISPOSABLE) ×9 IMPLANT
TOWEL OR 17X26 10 PK STRL BLUE (TOWEL DISPOSABLE) ×9 IMPLANT
TRAY FOLEY SILVER 16FR TEMP (SET/KITS/TRAYS/PACK) ×5 IMPLANT
TROCAR XCEL BLADELESS 5X75MML (TROCAR) ×5 IMPLANT
TROCAR XCEL NON-BLD 11X100MML (ENDOMECHANICALS) ×10 IMPLANT
TUBE SUCT INTRACARD DLP 20F (MISCELLANEOUS) ×5 IMPLANT
TUNNELER SHEATH ON-Q 11GX8 DSP (PAIN MANAGEMENT) ×1 IMPLANT
UNDERPAD 30X30 (UNDERPADS AND DIAPERS) ×5 IMPLANT
WATER STERILE IRR 1000ML POUR (IV SOLUTION) ×10 IMPLANT
WIRE .035 3MM-J 145CM (WIRE) ×5 IMPLANT

## 2017-04-16 SURGICAL SUPPLY — 86 items
ADAPTER CARDIO PERF ANTE/RETRO (ADAPTER) ×1 IMPLANT
ADH SKN CLS APL DERMABOND .7 (GAUZE/BANDAGES/DRESSINGS) ×1
ADPR PRFSN 84XANTGRD RTRGD (ADAPTER)
BAG DECANTER FOR FLEXI CONT (MISCELLANEOUS) ×2 IMPLANT
BLADE SURG 11 STRL SS (BLADE) ×1 IMPLANT
CANISTER SUCT 3000ML PPV (MISCELLANEOUS) ×4 IMPLANT
CANNULA FEM VENOUS REMOTE 22FR (CANNULA) IMPLANT
CANNULA FEMORAL ART 14 SM (MISCELLANEOUS) ×1 IMPLANT
CANNULA GUNDRY RCSP 15FR (MISCELLANEOUS) ×1 IMPLANT
CANNULA OPTISITE PERFUSION 16F (CANNULA) IMPLANT
CANNULA OPTISITE PERFUSION 18F (CANNULA) IMPLANT
CANNULA SUMP PERICARDIAL (CANNULA) ×2 IMPLANT
CATH KIT ON Q 5IN SLV (PAIN MANAGEMENT) IMPLANT
CONN ST 1/4X3/8  BEN (MISCELLANEOUS)
CONN ST 1/4X3/8 BEN (MISCELLANEOUS) ×2 IMPLANT
CONNECTOR 1/2X3/8X1/2 3 WAY (MISCELLANEOUS)
CONNECTOR 1/2X3/8X1/2 3WAY (MISCELLANEOUS) ×1 IMPLANT
CONT SPEC 4OZ CLIKSEAL STRL BL (MISCELLANEOUS) ×1 IMPLANT
COVER BACK TABLE 24X17X13 BIG (DRAPES) ×1 IMPLANT
CRADLE DONUT ADULT HEAD (MISCELLANEOUS) ×2 IMPLANT
DERMABOND ADVANCED (GAUZE/BANDAGES/DRESSINGS) ×1
DERMABOND ADVANCED .7 DNX12 (GAUZE/BANDAGES/DRESSINGS) ×2 IMPLANT
DEVICE PMI PUNCTURE CLOSURE (MISCELLANEOUS) ×1 IMPLANT
DEVICE TROCAR PUNCTURE CLOSURE (ENDOMECHANICALS) ×2 IMPLANT
DRAIN CHANNEL 28F RND 3/8 FF (WOUND CARE) ×2 IMPLANT
DRAPE BILATERAL SPLIT (DRAPES) ×2 IMPLANT
DRAPE C-ARM 42X72 X-RAY (DRAPES) ×1 IMPLANT
DRAPE CV SPLIT W-CLR ANES SCRN (DRAPES) ×2 IMPLANT
DRAPE INCISE IOBAN 66X45 STRL (DRAPES) ×6 IMPLANT
DRAPE SLUSH/WARMER DISC (DRAPES) ×2 IMPLANT
DRSG COVADERM 4X8 (GAUZE/BANDAGES/DRESSINGS) ×2 IMPLANT
ELECT BLADE 6.5 EXT (BLADE) ×1 IMPLANT
ELECT REM PT RETURN 9FT ADLT (ELECTROSURGICAL) ×4
ELECTRODE REM PT RTRN 9FT ADLT (ELECTROSURGICAL) ×2 IMPLANT
FELT TEFLON 1X6 (MISCELLANEOUS) ×3 IMPLANT
FEMORAL VENOUS CANN RAP (CANNULA) IMPLANT
GAUZE SPONGE 4X4 12PLY STRL (GAUZE/BANDAGES/DRESSINGS) ×1 IMPLANT
GLOVE ORTHO TXT STRL SZ7.5 (GLOVE) ×6 IMPLANT
GOWN STRL REUS W/ TWL LRG LVL3 (GOWN DISPOSABLE) ×4 IMPLANT
GOWN STRL REUS W/TWL LRG LVL3 (GOWN DISPOSABLE) ×10
KIT BASIN OR (CUSTOM PROCEDURE TRAY) ×2 IMPLANT
KIT DILATOR VASC 18G NDL (KITS) ×1 IMPLANT
KIT ROOM TURNOVER OR (KITS) ×2 IMPLANT
KIT SUCTION CATH 14FR (SUCTIONS) ×2 IMPLANT
LEAD PACING MYOCARDI (MISCELLANEOUS) ×1 IMPLANT
NDL AORTIC ROOT 14G 7F (CATHETERS) ×1 IMPLANT
NEEDLE AORTIC ROOT 14G 7F (CATHETERS) IMPLANT
NS IRRIG 1000ML POUR BTL (IV SOLUTION) ×10 IMPLANT
PACK OPEN HEART (CUSTOM PROCEDURE TRAY) ×2 IMPLANT
PAD ARMBOARD 7.5X6 YLW CONV (MISCELLANEOUS) ×4 IMPLANT
PAD ELECT DEFIB RADIOL ZOLL (MISCELLANEOUS) ×2 IMPLANT
SET CANNULATION TOURNIQUET (MISCELLANEOUS) ×1 IMPLANT
SET IRRIG TUBING LAPAROSCOPIC (IRRIGATION / IRRIGATOR) ×2 IMPLANT
SOLUTION ANTI FOG 6CC (MISCELLANEOUS) ×2 IMPLANT
SPONGE GAUZE 4X4 12PLY STER LF (GAUZE/BANDAGES/DRESSINGS) ×2 IMPLANT
SUT BONE WAX W31G (SUTURE) ×2 IMPLANT
SUT E-PACK MINIMALLY INVASIVE (SUTURE) ×1 IMPLANT
SUT ETHIBOND X763 2 0 SH 1 (SUTURE) ×1 IMPLANT
SUT GORETEX CV 4 TH 22 36 (SUTURE) ×1 IMPLANT
SUT GORETEX CV4 TH-18 (SUTURE) ×3 IMPLANT
SUT PROLENE 3 0 SH DA (SUTURE) ×1 IMPLANT
SUT PROLENE 3 0 SH1 36 (SUTURE) ×5 IMPLANT
SUT PROLENE 4 0 RB 1 (SUTURE) ×2
SUT PROLENE 4-0 RB1 .5 CRCL 36 (SUTURE) IMPLANT
SUT SILK 2 0 SH CR/8 (SUTURE) IMPLANT
SUT SILK 2 0SH CR/8 30 (SUTURE) ×1 IMPLANT
SUT SILK 3 0 SH CR/8 (SUTURE) IMPLANT
SUT VIC AB 2-0 CTX 36 (SUTURE) IMPLANT
SUT VIC AB 3-0 SH 8-18 (SUTURE) ×2 IMPLANT
SUT VICRYL 2 TP 1 (SUTURE) ×1 IMPLANT
SYR 10ML LL (SYRINGE) ×1 IMPLANT
SYSTEM SAHARA CHEST DRAIN ATS (WOUND CARE) ×2 IMPLANT
TOWEL GREEN STERILE (TOWEL DISPOSABLE) ×6 IMPLANT
TOWEL GREEN STERILE FF (TOWEL DISPOSABLE) ×3 IMPLANT
TOWEL OR 17X24 6PK STRL BLUE (TOWEL DISPOSABLE) ×2 IMPLANT
TOWEL OR 17X26 10 PK STRL BLUE (TOWEL DISPOSABLE) ×2 IMPLANT
TRAY FOLEY SILVER 16FR TEMP (SET/KITS/TRAYS/PACK) ×1 IMPLANT
TROCAR BLADELESS 5MM (ENDOMECHANICALS) ×1 IMPLANT
TROCAR XCEL BLADELESS 5X75MML (TROCAR) ×1 IMPLANT
TROCAR XCEL NON-BLD 11X100MML (ENDOMECHANICALS) ×2 IMPLANT
TUBE CONNECTING 12X1/4 (SUCTIONS) ×1 IMPLANT
TUBE SUCT INTRACARD DLP 20F (MISCELLANEOUS) ×1 IMPLANT
TUNNELER SHEATH ON-Q 11GX8 DSP (PAIN MANAGEMENT) IMPLANT
UNDERPAD 30X30 (UNDERPADS AND DIAPERS) ×1 IMPLANT
WATER STERILE IRR 1000ML POUR (IV SOLUTION) ×2 IMPLANT
WIRE .035 3MM-J 145CM (WIRE) ×1 IMPLANT

## 2017-04-16 NOTE — Anesthesia Preprocedure Evaluation (Addendum)
Anesthesia Evaluation  Patient identified by MRN, date of birth, ID band  Reviewed: Allergy & Precautions, NPO status , Patient's Chart, lab work & pertinent test results, Unable to perform ROS - Chart review only  Airway Mallampati: Intubated       Dental  (+) Teeth Intact   Pulmonary neg pulmonary ROS, former smoker,     + decreased breath sounds      Cardiovascular hypertension, + DOE  negative cardio ROS  + dysrhythmias + pacemaker  Rhythm:Regular Rate:Normal     Neuro/Psych PSYCHIATRIC DISORDERS negative neurological ROS  negative psych ROS   GI/Hepatic negative GI ROS, Neg liver ROS,   Endo/Other  negative endocrine ROSHypothyroidism   Renal/GU Renal disease     Musculoskeletal negative musculoskeletal ROS (+) Arthritis ,   Abdominal   Peds  Hematology  (+) anemia ,   Anesthesia Other Findings   Reproductive/Obstetrics negative OB ROS                                                            Anesthesia Evaluation  Patient identified by MRN, date of birth, ID band Patient awake    Reviewed: Allergy & Precautions, NPO status , Patient's Chart, lab work & pertinent test results, reviewed documented beta blocker date and time   History of Anesthesia Complications Negative for: history of anesthetic complications  Airway Mallampati: III  TM Distance: >3 FB Neck ROM: Full    Dental  (+) Teeth Intact, Dental Advisory Given, Caps   Pulmonary former smoker,    breath sounds clear to auscultation       Cardiovascular hypertension, Pt. on medications and Pt. 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) (-) Valvular Problems/MurmursMR  Rhythm:Irregular Rate:Normal  9/18 ECHO: EF 55-60%, mod MR with prolapse of middle scallop posterior leaflet, small PFO, mod TR   Neuro/Psych Anxiety negative  neurological ROS     GI/Hepatic negative GI ROS, Neg liver ROS,   Endo/Other  Hypothyroidism   Renal/GU Renal InsufficiencyRenal disease (creat 1.39)H/o glomerulonephritis     Musculoskeletal  (+) Arthritis , Osteoarthritis,    Abdominal   Peds  Hematology eliquis   Anesthesia Other Findings   Reproductive/Obstetrics                           Anesthesia Physical Anesthesia Plan  ASA: III  Anesthesia Plan: General   Post-op Pain Management:    Induction: Intravenous  PONV Risk Score and Plan: 3 and Midazolam and Treatment may vary due to age or medical condition  Airway Management Planned: Oral ETT and Double Lumen EBT  Additional Equipment: CVP, PA Cath, Ultrasound Guidance Line Placement, Arterial line and 3D TEE  Intra-op Plan:   Post-operative Plan: Post-operative intubation/ventilation  Informed Consent: I have reviewed the patients History and Physical, chart, labs and discussed the procedure including the risks, benefits and alternatives for the proposed anesthesia with the patient or authorized representative who has indicated his/her understanding and acceptance.   Dental advisory given  Plan Discussed with: CRNA, Anesthesiologist and Surgeon  Anesthesia Plan Comments: (Plan routine monitors, A line, PA catheter, GETA with DLT, TEE, post op ventilation VideoGlide scope available)  Anesthesia Quick Evaluation                                   Anesthesia Evaluation    Airway Mallampati: Intubated       Dental  (+) Teeth Intact   Pulmonary former smoker,           Cardiovascular hypertension,      Neuro/Psych    GI/Hepatic   Endo/Other  Morbid obesity  Renal/GU      Musculoskeletal   Abdominal   Peds  Hematology   Anesthesia Other Findings   Reproductive/Obstetrics                            Anesthesia Physical Anesthesia Plan  ASA: IV and  emergent  Anesthesia Plan:    Post-op Pain Management:    Induction: Intravenous  PONV Risk Score and Plan:   Airway Management Planned: Oral ETT, Video Laryngoscope Planned and Double Lumen EBT  Additional Equipment:   Intra-op Plan:   Post-operative Plan: Post-operative intubation/ventilation  Informed Consent: I have reviewed the patients History and Physical, chart, labs and discussed the procedure including the risks, benefits and alternatives for the proposed anesthesia with the patient or authorized representative who has indicated his/her understanding and acceptance.   Only emergency history available and History available from chart only  Plan Discussed with: CRNA, Anesthesiologist and Surgeon  Anesthesia Plan Comments:         Anesthesia Quick Evaluation  Anesthesia Physical Anesthesia Plan  ASA: IV and emergent  Anesthesia Plan: General   Post-op Pain Management:    Induction: Inhalational  PONV Risk Score and Plan: 4 or greater and Treatment may vary due to age or medical condition  Airway Management Planned: Video Laryngoscope Planned, Double Lumen EBT and Oral ETT  Additional Equipment:   Intra-op Plan:   Post-operative Plan: Post-operative intubation/ventilation  Informed Consent: I have reviewed the patients History and Physical, chart, labs and discussed the procedure including the risks, benefits and alternatives for the proposed anesthesia with the patient or authorized representative who has indicated his/her understanding and acceptance.   Only emergency history available and History available from chart only  Plan Discussed with: CRNA, Anesthesiologist and Surgeon  Anesthesia Plan Comments:        Anesthesia Quick Evaluation

## 2017-04-16 NOTE — Progress Notes (Signed)
Patient ID: Amanda [name], female   DOB: 1940-09-26, 76 y.o.   MRN: [id number]  SICU Evening Rounds:   Hemodynamically stable  CI = 1.7 on milrinone 0.2  Remains on vent  Urine output good  CT output 190, 220, 180 the last three hrs with platelets 77K, INR 1.78 postop. Currently replacing platelets and giving FFP.  CBC    Component Value Date/Time   WBC 12.2 (H) 04/16/2017 1523   RBC 3.21 (L) 04/16/2017 1523   HGB 9.6 (L) 04/16/2017 1523   HGB 12.6 03/25/2017 0948   HCT 28.6 (L) 04/16/2017 1523   HCT 36.5 03/25/2017 0948   PLT 77 (L) 04/16/2017 1523   PLT 221 03/25/2017 0948   MCV 89.1 04/16/2017 1523   MCV 88 03/25/2017 0948   MCH 29.9 04/16/2017 1523   MCHC 33.6 04/16/2017 1523   RDW 14.4 04/16/2017 1523   RDW 12.4 03/25/2017 0948   LYMPHSABS 1.9 03/05/2017 1000   MONOABS 1.6 (H) 04/01/2015 2026   EOSABS 0.3 03/05/2017 1000   BASOSABS 0.0 03/05/2017 1000     BMET    Component Value Date/Time   NA 157 (H) 04/16/2017 1353   NA 143 03/25/2017 0948   K 3.3 (L) 04/16/2017 1353   CL 97 (L) 04/16/2017 1353   CO2 27 04/12/2017 1519   GLUCOSE 136 (H) 04/16/2017 1353   BUN 19 04/16/2017 1353   BUN 21 03/25/2017 0948   CREATININE 1.00 04/16/2017 1353   CREATININE 1.43 (H) 03/16/2016 0841   CALCIUM 9.2 04/12/2017 1519   GFRNONAA 35 (L) 04/12/2017 1519   GFRAA 41 (L) 04/12/2017 1519     A/P:  Stable hemodynamics with postop coagulopathy and elevated chest tube output. Continue to correct coagulopathy and monitor closely.

## 2017-04-16 NOTE — Op Note (Signed)
CARDIOTHORACIC SURGERY OPERATIVE NOTE  Date of Procedure:   04/16/2017  Preoperative Diagnosis:  Bleeding s/p Minimally Invasive Mitral Valve Repair  Postoperative Diagnosis:  same  Procedure:    Reexploration of right mini-thoracotomy for bleeding  Surgeon:    Salvatore Decent. Cornelius Moras, MD  Assistant:    Ronn Melena, CRNFA  Anesthesia:    Lewie Loron, MD    BRIEF CLINICAL NOTE AND INDICATIONS FOR SURGERY  Patient is a 76 year old female who underwent minimally invasive mitral valve repair and Maze procedure through a right mini anterior thoracotomy on 04/16/2017. The procedure was uncomplicated and the patient transferred to the surgical intensive care unit in stable condition. Over the next several hours the patient had between 150 and 250 mL of chest tube output per hour. Despite correction of thrombocytopenia and mild coagulopathy the elevated chest tube output persisted. The patient remained remarkably stable throughout this period of time. The patient's husband was contacted via telephone and plans were made to bring the patient back to the operating room for reexploration.     DETAILS OF THE OPERATIVE PROCEDURE  The patient is brought to the operating room on the above mentioned date and placed in the supine position on the operative table. General endotracheal anesthesia and central monitoring is maintained throughout the procedure. The patient is reintubated using a dual lumen endotracheal tube by the anesthesia team under the care and direction of Dr. Renold Don.  The patient's right chest, abdomen, and both groins are prepared and draped in sterile manner.  The patient's previous right anterior mini thoracotomy incision is reopened. There is no accumulated blood in the soft tissues of the chest wall. A rib retractor is placed. Single lung ventilation is begun. There is a moderate amount of blood clot in the right posterior hemithorax. This is all evacuated. The right chest is  irrigated with copious warm saline solution. An extensive search for sites of mechanical bleeding is entertained. A 15 5 mm endoscopic scope is utilized to explore the inside of the right chest thoroughly. There is no sign of ongoing bleeding. The chest is again irrigated with saline solution and carefully examined. With no signs of active bleeding the right mini thoracotomy incision is reclosed in multiple layers in routine fashion.  The patient tired the procedure well and is reintubated using a single lumen endotracheal tube. The patient is transported back to the surgical intensive care unit in stable condition. There are no intraoperative complications.     Salvatore Decent. Cornelius Moras MD 04/16/2017 10:12 PM

## 2017-04-16 NOTE — Brief Op Note (Addendum)
04/16/2017  2:12 PM  PATIENT:  Amanda [name]  76 y.o. female  PRE-OPERATIVE DIAGNOSIS:  1. Severe MR 2. PAROXYSMAL AFIB  POST-OPERATIVE DIAGNOSIS: 1. Severe MR 2. PAROXYSMAL AFIB  PROCEDURE:  TRANSESOPHAGEAL ECHOCARDIOGRAM (TEE), MINIMALLY INVASIVE MITRAL VALVE REPAIR (MVR) (using a Sorin Memo 3D, [id number], size 32, serial # [id number]), [facility] and BIPOLAR MINIMALLY INVASIVE MAZE PROCEDURE, CLIPPING OF ATRIAL APPENDAGE using AtriCure Pro2 clip 45  SURGEON:  Surgeon(s) and Role:    * Purcell Nails, MD - Primary  PHYSICIAN ASSISTANT: Doree Fudge PA-C  ANESTHESIA:   general  EBL:  Total I/O In: 3235 [I.V.:2200; Blood:535; IV Piggyback:500] Out: 1275 [Urine:1275]  BLOOD ADMINISTERED:One CC PRBC  DRAINS: Chest tubes placed in the pleural space   SPECIMEN:  Source of Specimen:  Portion of posterior segement of the mitral valve  DISPOSITION OF SPECIMEN:  PATHOLOGY  COUNTS:  YES  DICTATION: .Dragon Dictation  PLAN OF CARE: Admit to inpatient   PATIENT DISPOSITION:  Short Stay   Delay start of Pharmacological VTE agent (>24hrs) due to surgical blood loss or risk of bleeding: yes  BASELINE WEIGHT: 64.4 kg  Purcell Nails, MD 04/16/2017 2:12 PM

## 2017-04-16 NOTE — Anesthesia Procedure Notes (Signed)
Procedure Name: Intubation Date/Time: 04/16/2017 9:21 PM Performed by: Brien Mates D Pre-anesthesia Checklist: Patient identified, Emergency Drugs available, Suction available, Patient being monitored and Timeout performed Patient Re-evaluated:Patient Re-evaluated prior to induction Oxygen Delivery Method: Circle system utilized Preoxygenation: Pre-oxygenation with 100% oxygen Laryngoscope Size: Glidescope Grade View: Grade I Endobronchial tube: Left, EBT position confirmed by auscultation, Double lumen EBT and EBT position confirmed by fiberoptic bronchoscope and 35 Fr Number of attempts: 2 (see note) Airway Equipment and Method: Stylet,  Video-laryngoscopy and Fiberoptic brochoscope Placement Confirmation: ETT inserted through vocal cords under direct vision,  positive ETCO2 and breath sounds checked- equal and bilateral Secured at: 37 cm Tube secured with: Tape Dental Injury: Teeth and Oropharynx as per pre-operative assessment

## 2017-04-16 NOTE — Transfer of Care (Signed)
Immediate Anesthesia Transfer of Care Note  Patient: Amanda [name]  Procedure(s) Performed: MINIMALLY INVASIVE MITRAL VALVE REPAIR (MVR) (Right Chest) MINIMALLY INVASIVE MAZE PROCEDURE (N/A Chest) TRANSESOPHAGEAL ECHOCARDIOGRAM (TEE) (N/A ) CLIPPING OF ATRIAL APPENDAGE using AtriCure Pro2 clip 45 (Chest)  Patient Location: SICU  Anesthesia Type:General  Level of Consciousness: sedated and Patient remains intubated per anesthesia plan  Airway & Oxygen Therapy: Patient remains intubated per anesthesia plan and Patient placed on Ventilator (see vital sign flow sheet for setting)  Post-op Assessment: Report given to RN and Post -op Vital signs reviewed and stable  Post vital signs: Reviewed and stable  Last Vitals:  Vitals:   04/16/17 0553 04/16/17 1519  BP: (!) 181/61 103/68  Pulse: 63 77  Resp: 18 14  Temp: 36.6 C (!) 35.2 C  SpO2: 100% 98%    Last Pain:  Vitals:   04/16/17 0553  TempSrc: Oral      Patients Stated Pain Goal: 3 (04/16/17 0553)  Complications: No apparent anesthesia complications

## 2017-04-16 NOTE — Progress Notes (Signed)
[employer] rep Kerry Fort called and message left will try to call again if no response by [phone]

## 2017-04-16 NOTE — Progress Notes (Signed)
[facility] BRIEF SICU PROGRESS NOTE  Day of Surgery  S/P Procedure(s) (LRB): MINIMALLY INVASIVE MITRAL VALVE REPAIR (MVR) (Right) MINIMALLY INVASIVE MAZE PROCEDURE (N/A) TRANSESOPHAGEAL ECHOCARDIOGRAM (TEE) (N/A) CLIPPING OF ATRIAL APPENDAGE using AtriCure Pro2 clip 45   Patient is waking up on vent and remains hemodynamically stable However, chest tube output remains elevated and Hgb has dropped c/w acute blood loss Suspect possible chest wall source of bleeding Repeat CXR stable and w/out signs of accumulating hemothorax  Plan: Will return to OR for reexploration.  Discussed w/ patient's husband via telephone.  Purcell Nails, MD 04/16/2017 7:33 PM

## 2017-04-16 NOTE — Brief Op Note (Signed)
04/16/2017  10:17 PM  PATIENT:  Amanda [name]  76 y.o. female  PRE-OPERATIVE DIAGNOSIS:  post-op Bleeding  POST-OPERATIVE DIAGNOSIS:  post-op Bleeding  PROCEDURE:  Procedure(s): RE-EXPLORATION RIGHT THORACOTOMY FOR BLEEDING (Right)  SURGEON:  Surgeon(s) and Role:    Purcell Nails, MD - Primary  PHYSICIAN ASSISTANT:   ASSISTANTS: none   ANESTHESIA:   none  EBL:  Total I/O In: 1546.5 [I.V.:176.5; Blood:1370] Out: 325 [Urine:135; Chest Tube:190]  BLOOD ADMINISTERED: see anesthesia record  DRAINS: 2 Chest Tube(s) in the right pleural space   LOCAL MEDICATIONS USED:  NONE  SPECIMEN:  No Specimen  DISPOSITION OF SPECIMEN:  N/A  COUNTS:  YES  TOURNIQUET:  * No tourniquets in log *  DICTATION: .Note written in EPIC  PLAN OF CARE: Admit to inpatient   PATIENT DISPOSITION:  ICU - intubated and hemodynamically stable.   Delay start of Pharmacological VTE agent (>24hrs) due to surgical blood loss or risk of bleeding: yes  Purcell Nails, MD 04/16/2017 10:18 PM

## 2017-04-16 NOTE — OR Nursing (Signed)
13:35 - 45 minute call to SICU 14:05 - 20 minute call to SICU

## 2017-04-16 NOTE — Anesthesia Procedure Notes (Signed)
Arterial Line Insertion Start/End10/03/2017 7:15 AM, 04/16/2017 7:30 AM Performed by: Kirt Boys P  Patient location: Pre-op. Preanesthetic checklist: patient identified, IV checked, risks and benefits discussed, pre-op evaluation and timeout performed Left, radial was placed Catheter size: 20 G  Attempts: 3 Procedure performed without using ultrasound guided technique. Following insertion, dressing applied and Biopatch. Post procedure assessment: normal and unchanged  Patient tolerated the procedure well with no immediate complications.

## 2017-04-16 NOTE — Anesthesia Preprocedure Evaluation (Addendum)
Anesthesia Evaluation  Patient identified by MRN, date of birth, ID band Patient awake    Reviewed: Allergy & Precautions, NPO status , Patient's Chart, lab work & pertinent test results, reviewed documented beta blocker date and time   History of Anesthesia Complications Negative for: history of anesthetic complications  Airway Mallampati: III  TM Distance: >3 FB Neck ROM: Full    Dental  (+) Teeth Intact, Dental Advisory Given, Caps   Pulmonary former smoker,    breath sounds clear to auscultation       Cardiovascular hypertension, Pt. on medications and Pt. 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) (-) Valvular Problems/Murmurs Rhythm:Irregular Rate:Normal  9/18 ECHO: EF 55-60%, mod MR with prolapse of middle scallop posterior leaflet, small PFO, mod TR   Neuro/Psych PSYCHIATRIC DISORDERS Anxiety negative neurological ROS     GI/Hepatic negative GI ROS, Neg liver ROS,   Endo/Other  Hypothyroidism   Renal/GU Renal InsufficiencyRenal disease (creat 1.39)H/o glomerulonephritis     Musculoskeletal  (+) Arthritis , Osteoarthritis,    Abdominal   Peds  Hematology  (+) anemia , eliquis   Anesthesia Other Findings   Reproductive/Obstetrics                            Anesthesia Physical  Anesthesia Plan  ASA: IV and emergent  Anesthesia Plan: General   Post-op Pain Management:    Induction: Intravenous  PONV Risk Score and Plan: 4 or greater and Treatment may vary due to age or medical condition  Airway Management Planned: Oral ETT, Double Lumen EBT and Video Laryngoscope Planned  Additional Equipment:   Intra-op Plan:   Post-operative Plan: Post-operative intubation/ventilation  Informed Consent: I have reviewed the patients History and Physical, chart, labs and discussed the procedure including the risks,  benefits and alternatives for the proposed anesthesia with the patient or authorized representative who has indicated his/her understanding and acceptance.   Dental advisory given  Plan Discussed with: CRNA  Anesthesia Plan Comments:        Anesthesia Quick Evaluation

## 2017-04-16 NOTE — Interval H&P Note (Signed)
History and Physical Interval Note:  04/16/2017 6:32 AM  Amanda [name]  has presented today for surgery, with the diagnosis of MR AFIB  The various methods of treatment have been discussed with the patient and family. After consideration of risks, benefits and other options for treatment, the patient has consented to  Procedure(s): MINIMALLY INVASIVE MITRAL VALVE REPAIR (MVR) (Right) MINIMALLY INVASIVE MAZE PROCEDURE (N/A) TRANSESOPHAGEAL ECHOCARDIOGRAM (TEE) (N/A) as a surgical intervention .  The patient's history has been reviewed, patient examined, no change in status, stable for surgery.  I have reviewed the patient's chart and labs.  Questions were answered to the patient's satisfaction.     Purcell Nails

## 2017-04-16 NOTE — Anesthesia Procedure Notes (Signed)
Procedure Name: Intubation Date/Time: 04/16/2017 8:37 AM Performed by: Carney Living Pre-anesthesia Checklist: Patient identified, Emergency Drugs available, Patient being monitored, Timeout performed and Suction available Patient Re-evaluated:Patient Re-evaluated prior to induction Oxygen Delivery Method: Circle system utilized Preoxygenation: Pre-oxygenation with 100% oxygen Induction Type: IV induction Ventilation: Mask ventilation without difficulty and Oral airway inserted - appropriate to patient size Laryngoscope Size: Mac and 4 Grade View: Grade II Tube type: Oral Endobronchial tube: Double lumen EBT and EBT position confirmed by fiberoptic bronchoscope and 37 Fr Number of attempts: 2 Airway Equipment and Method: Stylet and Fiberoptic brochoscope Placement Confirmation: ETT inserted through vocal cords under direct vision and positive ETCO2 Secured at: 29 cm Tube secured with: Tape Dental Injury: Teeth and Oropharynx as per pre-operative assessment

## 2017-04-16 NOTE — Progress Notes (Signed)
  Echocardiogram Echocardiogram Transesophageal has been performed.  Leta Jungling M 04/16/2017, 9:51 AM

## 2017-04-16 NOTE — Op Note (Signed)
CARDIOTHORACIC SURGERY OPERATIVE NOTE  Date of Procedure:   04/16/2017  Preoperative Diagnosis:    Severe Mitral Regurgitation  Recurrent Paroxysmal Atrial Fibrillation  Postoperative Diagnosis: Same  Procedure:    Minimally-Invasive Mitral Valve Repair  Complex valvuloplasty including triangular resection of posterior leaflet (P2)  Artificial Gore-tex neochords x6  Sorin Memo 3D Ring Annuloplasty (size 32mm, catalog [id number], serial [id number])   Minimally-Invasive Maze Procedure  Complete bilateral atrial lesion set using cryothermy and bipolar radiofrequency ablation  Clipping of Left Atrial Appendage (Atricure Pro245 left atrial clip, size 45 mm)  Surgeon: Salvatore Decent. 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  Bileaflet prolapse with multiple elongated but no ruptured chordae tendineae  Severe prolapse of P2 segment of posterior leaflet  Type II dysfunction with severe mitral regurgitation  Normal left ventricular systolic function               BRIEF CLINICAL NOTE AND INDICATIONS FOR SURGERY  Patient is a 76 year old female with history of mitral valve prolapse and mitral regurgitation, hypertension, stage III chronic kidney disease secondary to glomerulonephritis and hypertension, heart block status post permanent pacemaker placement, paroxysmal atrial fibrillation on long-term anticoagulation, hyperparathyroidism, hypothyroidism, hyperlipidemia, interstitial cystitis with recurrent urinary tract infections, and small vessel cerebrovascular disease who has been referred for surgical consultation to discuss treatment for management of severe symptomatic mitral regurgitation. The patient states that she was first noted to have a heart murmur on physical exam more than 30 years ago. For many years she was followed by Dr. [name]  with known history of mitral valve prolapse and more recently she has been followed by [name]. She developed symptomatic bradycardia for which she underwent placement of a permanent pacemaker by Dr. [name] in 2016. Echocardiogram at that time revealed normal left ventricular systolic function with mild to moderate mitral regurgitation. Follow-up echocardiogram performed in June 2017 revealed preserved left ventricular systolic function but significant worsening in severity of mitral regurgitation. The patient remained asymptomatic at that time. Interrogation of her pacemaker revealed episodes of paroxysmal atrial fibrillation. The patient was started on Eliquis for anticoagulation. Over the past couple of months the patient has developed significant progression of symptoms of exertional shortness of breath. TEE was planned but had to be delayed because of esophageal dysmotility with a lower esophageal ring. She subsequently underwent EGD with esophageal dilatation. TEE performed 03/18/2017 revealed normal left ventricular systolic function with mitral valve prolapse causing severe mitral regurgitation. Patient was referred for elective surgical consultation.  The patient has been seen in consultation and counseled at length regarding the indications, risks and potential benefits of surgery.  All questions have been answered, and the patient provides full informed consent for the operation as described.     DETAILS OF THE OPERATIVE PROCEDURE  Preparation:  The patient is brought to the operating room on the above mentioned date and central monitoring was established by the anesthesia team including placement of Swan-Ganz catheter through the left internal jugular vein.  A radial arterial line is placed. The patient is placed in the supine position on the operating table.  Intravenous antibiotics are administered. General endotracheal anesthesia is induced uneventfully. The patient is initially  intubated using a dual lumen endotracheal tube.  A Foley catheter is placed.  Baseline transesophageal echocardiogram was performed.  Findings were notable for Bileaflet prolapse of the mitral valve with severe prolapse involving the  P2 segment of the posterior leaflet. There was also significant prolapse of the A2 segment of the anterior leaflet. The leaflets were moderately thickened and redundant. There were multiple elongated primary chordae tendineae. There was no evidence of any ruptured chordae tendoneae. There was severe mitral regurgitation with flow reversal in the pulmonary veins. There was normal left ventricular size and systolic function. There was mild to moderate left ventricular hypertrophy. Right ventricular size and function was normal. There was trace to mild tricuspid regurgitation.  A soft roll is placed behind the patient's left scapula and the neck gently extended and turned to the left.   The patient's right neck, chest, abdomen, both groins, and both lower extremities are prepared and draped in a sterile manner. A time out procedure is performed.   Surgical Approach:  A right miniature anterolateral thoracotomy incision is performed. The incision is placed just lateral to and superior to the right nipple. The pectoralis major muscle is retracted medially and completely preserved. The right pleural space is entered through the 3rd intercostal space. A soft tissue retractor is placed.  Two 11 mm ports are placed through separate stab incisions inferiorly. The right pleural space is insufflated continuously with carbon dioxide gas through the posterior port during the remainder of the operation.  A pledgeted sutures placed through the dome of the right hemidiaphragm and retracted inferiorly to facilitate exposure.  A longitudinal incision is made in the pericardium 3 cm anterior to the phrenic nerve and silk traction sutures are placed on either side of the incision for  exposure.   Extracorporeal Cardiopulmonary Bypass:  A small incision is made in the right inguinal crease and the anterior surface of the right common femoral artery and right common femoral vein are identified.  The patient is placed in Trendelenburg position. The right internal jugular vein is cannulated with Seldinger technique and a guidewire advanced into the right atrium. The patient is heparinized systemically. The right internal jugular vein is cannulated with a 14 [place] pediatric femoral venous cannula. Pursestring sutures are placed on the anterior surface of the right common femoral vein and right common femoral artery. The right common femoral vein is cannulated with the Seldinger technique and a guidewire is advanced under transesophageal echocardiogram guidance through the right atrium. The femoral vein is cannulated with a long 22 French femoral venous cannula. The right common femoral artery is cannulated with Seldinger technique and a flexible guidewire is advanced until it can be appreciated intraluminally in the descending thoracic aorta on transesophageal echocardiogram. The femoral artery is cannulated with an 18 French femoral arterial cannula.  Adequate heparinization is verified.      The entire pre-bypass portion of the operation was notable for stable hemodynamics.  Cardiopulmonary bypass was begun.  Vacuum assist venous drainage is utilized. The incision in the pericardium is extended in both directions. Venous drainage and exposure are notably excellent.    Clipping of Left Atrial Appendage:  The left atrial appendage is obliterated using an Atricure left atrial appendage clip (Atriclip Pro245, size 45mm).  The clip is applied under thoracoscopic visualization posterior to the aorta and pulmonary artery through the oblique sinus.  The clip was applied prior to application of the aortic crossclamp, with transesophageal echocardiographic confirmation that the clip  satisfactorily obliterates the appendage.   Myocardial Protection  A retrograde cardioplegia cannula is placed through the right atrium into the coronary sinus using transesophageal echocardiogram guidance.  An antegrade cardioplegia cannula is placed in the ascending aorta.  The patient is cooled to 28C systemic temperature.  The aortic cross clamp is applied and cardioplegia is delivered initially in an antegrade fashion through the aortic root using modified [place] cold blood cardioplegia (Kennestone blood cardioplegia protocol).   The initial cardioplegic arrest is rapid with early diastolic arrest.  Repeat doses of cardioplegia are administered at 90 minutes and every 30 minutes thereafter through the coronary sinus catheter in order to maintain completely flat electrocardiogram.  Myocardial protection was felt to be excellent.   Maze Procedure (left atrial lesion set):  Following placement of the aortic crossclamp and the administration of the initial arresting dose of cardioplegia, a left atriotomy incision was performed through the interatrial groove and extended partially across the back wall of the left atrium after opening the oblique sinus inferiorly.  The mitral valve and floor of the left atrium are exposed using a self-retaining retractor.    The Atricure CryoICE nitrous oxide cryothermy system is utilized for all cryothermy ablation lesions.  The left atrial lesion set of the Cox cryomaze procedure is now performed using 3 minute duration for all cryothermy lesions.  Initially a lesion is placed along the endocardial surface of the left atrium from the caudad apex of the atriotomy incision across the posterior wall of the left atrium onto the posterior mitral annulus.  A mirror image lesion along the epicardial surface is then performed with the probe posterior to the left atrium, crossing over the coronary sinus.  Two lesions are then performed to create a box isolating all of the  pulmonary veins from the remainder of the left atrium.  The first lesion is placed from the cephalad apex of the atriotomy incision across the dome of the left atrium to just anterior to the left sided pulmonary veins.  The second lesion completes the box from the caudad apex of the atriotomy incision across the back wall of the left atrium to connect with the previous lesion just anterior to the left sided pulmonary veins.     Mitral Valve Repair:  The mitral valve was inspected and notable for forme fruste variant of Barlow's type myxomatous degenerative disease. There was moderate thickening with redundant leaflet tissue and bileaflet prolapse. There was severe prolapse involving the middle scallop (P2) of the posterior leaflet.  There were multiple elongated primary chordae tendinea. There were no ruptured cords. There was no significant calcification.   Interrupted 2-0 Ethibond horizontal mattress sutures are placed circumferentially around the entire mitral valve annulus. The sutures will ultimately be utilized for ring annuloplasty, and at this juncture there are utilized to suspend the valve symmetrically.  The severely prolapsing P2 segment of the posterior leaflet was repaired using a simple triangular resection. Approximately 40% of the surface area of P2 was excised. Intervening vertical defect was closed using interrupted simple CV 5 Gore-Tex sutures.  Artificial neochord placement was performed using Chord-X multi-strand CV-4 Goretex pre-measured loops.  The appropriate cord length was measured from corresponding normal length primary cords from the P1 segment of the posterior leaflet. The papillary muscle suture of the Chord-X multi-strand suture was placed through the head of the anterior papillary muscle in a horizontal mattress fashion and tied over Teflon felt pledgets. Each of the three pre-measured loops were then reimplanted into the free margin of the P2 segment of the posterior  leaflet.    The valve is tested with saline and appears reasonably competent even prior to ring annuloplasty.  The valve is sized to accept a  32 mm annuloplasty ring based upon the distance between the left and right commissures, the height and the surface area of the anterior leaflet.  A Sorin Memo 3D annuloplasty ring (size 32 mm, catalog # [id number], serial # [id number]) is implanted uneventfully.  All ring sutures were secured using a Cor-knot device.  The valve is again tested with saline and appears to be perfectly competent with a broad symmetrical line of coaptation of the anterior and posterior leaflet. There is no residual leak. Rewarming is begun.  The atriotomy was closed using a 2-layer closure of running 3-0 Prolene suture after placing a sump drain across the mitral valve to serve as a left ventricular vent.  One final dose of warm retrograde "reanimation dose" cardioplegia was administered retrograde through the coronary sinus catheter while all air was evacuated through the aortic root.  The aortic cross clamp was removed after a total cross clamp time of 127 minutes.   Maze Procedure (right atrial lesion set):  The retrograde cardioplegia cannula was removed and the small hole in the right atrium extended a short distance.  The AtriCure Synergy bipolar radiofrequency ablation clamp is utilized to create a series of linear lesions in the right atrium, each with one limb of the clamp along the endocardial surface and the other along the epicardial surface. Care was taken to avoid the pre-existing permanent pacemaker leads within the right atrium. The first lesion is placed from the posterior apex of the atriotomy incision and along the lateral wall of the right atrium to reach the lateral aspect of the superior vena cava. A second lesion is placed in the opposite direction from the posterior apex of the atriotomy incision along the lateral wall to reach the lateral aspect of the inferior vena  cava. A third lesion is placed from the midportion of the atriotomy incision extending at a right angle to reach the tip of the right atrial appendage. A fourth lesion is placed from the anterior apex of the atriotomy incision in an anterior and inferior direction to reach the acute margin of the heart. Finally, the cryotherapy probe is utilized to complete the right atrial lesion set by placing the probe along the endocardial surface of the right atrium from the anterior apex of the atriotomy incision to reach the tricuspid annulus at the 2:00 position. The atriotomy incision is closed with a 2 layer closure of running 4-0 Prolene suture.   Procedure Completion:  Epicardial pacing wires are fixed to the inferior wall of the right ventricule and to the right atrial appendage. The patient is rewarmed to 37C temperature. The left ventricular vent is removed.  The patient is ventilated and flow volumes turndown while the mitral valve repair is inspected using transesophageal echocardiogram. The valve repair appears intact with no residual leak. The antegrade cardioplegia cannula is now removed. The patient is weaned and disconnected from cardiopulmonary bypass.  The patient's rhythm at separation from bypass was AV paced.  The patient was weaned from bypass without any inotropic support. Total cardiopulmonary bypass time for the operation was 182 minutes.  Followup transesophageal echocardiogram performed after separation from bypass revealed a well-seated annuloplasty ring in the mitral position with a normal functioning mitral valve. There was no residual leak.  Left ventricular function was unchanged from preoperatively.  The femoral arterial and venous cannulae were removed uneventfully. There was a palpable pulse in the distal right common femoral artery after removal of the cannula. Protamine was administered to reverse the anticoagulation. The  right internal jugular cannula was removed and manual  pressure held on the neck for 15 minutes.  Single lung ventilation was begun. The atriotomy closure was inspected for hemostasis. The pericardial sac was drained using a 28 French Bard drain placed through the anterior port incision. The right pleural space is irrigated with saline solution and inspected for hemostasis. An On-Q catheter is placed through the subcutaneous tissues to the right posterior port incision and subsequently tunneled through the chest wall into the right pleural space. The catheters then tunneled into the subpleural space posteriorly to cover the second through the sixth intercostal nerve roots. Catheters flushed with 0.5% bupivacaine solution and ultimately connected to a continuous infusion pump. The right pleural space was drained using a 28 French Bard drain placed through the posterior port incision. The miniature thoracotomy incision was closed in multiple layers in routine fashion. The right groin incision was inspected for hemostasis and closed in multiple layers in routine fashion.  The post-bypass portion of the operation was notable for stable rhythm and hemodynamics.  The patient received 2 units packed red blood cells during the procedure due to anemia which was present preoperatively and exacerbated by acute blood loss and hemodilution during cardiopulmonary bypass.   Disposition:  The patient tolerated the procedure well.  The patient was reintubated using a single lumen endotracheal tube and subsequently transported to the surgical intensive care unit in stable condition. There were no intraoperative complications. All sponge instrument and needle counts are verified correct at completion of the operation.    Salvatore Decent. Cornelius Moras MD 04/16/2017 2:20 PM

## 2017-04-16 NOTE — Anesthesia Procedure Notes (Signed)
Central Venous Catheter Insertion Performed by: Annye Asa, anesthesiologist Start/End10/03/2017 6:46 AM, 04/16/2017 7:53 AM Preanesthetic checklist: patient identified, IV checked, site marked, risks and benefits discussed, surgical consent, monitors and equipment checked, pre-op evaluation, timeout performed and anesthesia consent Position: Trendelenburg Lidocaine 1% used for infiltration and patient sedated Hand hygiene performed , maximum sterile barriers used  and Seldinger technique used Catheter size: 8.5 Fr PA cath was placed.Sheath introducer Swan type:thermodilution Procedure performed using ultrasound guided technique. Ultrasound Notes:anatomy identified, needle tip was noted to be adjacent to the nerve/plexus identified, no ultrasound evidence of intravascular and/or intraneural injection and image(s) printed for medical record Attempts: 5 or more (successful angiocath into LIJ with US guidance, unable to pass guidewire despite mult attempta) Following insertion, line sutured and dressing applied. Post procedure assessment: blood return through all ports, free fluid flow and no air  Patient tolerated the procedure well with no immediate complications. Additional procedure comments: PA catheter:  Routine monitors. Timeout, sterile prep, drape, FBP L neck.  Trendelenburg position.  1% Lido local, finder and trocar LIJ 1st pass with US guidance, unable to pass guidewire, repeat stick #18 ga AC into LIJ with US guidance, still unable to pass guidewire despite mult attempts. Discussed with Dr. Roxy Manns.  Re-prep and drape R neck. 1% lido local, finder and trocar RIJ 1st pass with US guidance.  Cordis placed over J wire. PA catheter in easily.  Sterile dressing applied.  Patient tolerated well, VSS.  Jenita Seashore, MD.

## 2017-04-16 NOTE — Progress Notes (Signed)
Spoke with [employer] jude rep. Will be here in 15 minutes.  Stated that magnet will need to be used.  Brain will speak with anesthesia when he gets here

## 2017-04-17 ENCOUNTER — Inpatient Hospital Stay (HOSPITAL_COMMUNITY): Payer: Medicare Other

## 2017-04-17 ENCOUNTER — Encounter (HOSPITAL_COMMUNITY): Payer: Self-pay | Admitting: Thoracic Surgery (Cardiothoracic Vascular Surgery)

## 2017-04-17 LAB — POCT I-STAT 3, ART BLOOD GAS (G3+)
ACID-BASE DEFICIT: 1 mmol/L (ref 0.0–2.0)
Acid-base deficit: 3 mmol/L — ABNORMAL HIGH (ref 0.0–2.0)
Acid-base deficit: 4 mmol/L — ABNORMAL HIGH (ref 0.0–2.0)
Acid-base deficit: 4 mmol/L — ABNORMAL HIGH (ref 0.0–2.0)
Acid-base deficit: 5 mmol/L — ABNORMAL HIGH (ref 0.0–2.0)
Bicarbonate: 20.3 mmol/L (ref 20.0–28.0)
Bicarbonate: 20.4 mmol/L (ref 20.0–28.0)
Bicarbonate: 21.5 mmol/L (ref 20.0–28.0)
Bicarbonate: 22.9 mmol/L (ref 20.0–28.0)
Bicarbonate: 23.3 mmol/L (ref 20.0–28.0)
Bicarbonate: 23.5 mmol/L (ref 20.0–28.0)
O2 SAT: 92 %
O2 SAT: 95 %
O2 SAT: 96 %
O2 Saturation: 91 %
O2 Saturation: 93 %
O2 Saturation: 94 %
PCO2 ART: 30.4 mmHg — AB (ref 32.0–48.0)
PO2 ART: 60 mmHg — AB (ref 83.0–108.0)
PO2 ART: 72 mmHg — AB (ref 83.0–108.0)
Patient temperature: 37.2
Patient temperature: 97.7
Patient temperature: 97.7
TCO2: 21 mmol/L — ABNORMAL LOW (ref 22–32)
TCO2: 22 mmol/L (ref 22–32)
TCO2: 23 mmol/L (ref 22–32)
TCO2: 24 mmol/L (ref 22–32)
TCO2: 24 mmol/L (ref 22–32)
TCO2: 24 mmol/L (ref 22–32)
pCO2 arterial: 32.3 mmHg (ref 32.0–48.0)
pCO2 arterial: 35.2 mmHg (ref 32.0–48.0)
pCO2 arterial: 38.1 mmHg (ref 32.0–48.0)
pCO2 arterial: 38.2 mmHg (ref 32.0–48.0)
pCO2 arterial: 41.5 mmHg (ref 32.0–48.0)
pH, Arterial: 7.334 — ABNORMAL LOW (ref 7.350–7.450)
pH, Arterial: 7.35 (ref 7.350–7.450)
pH, Arterial: 7.356 (ref 7.350–7.450)
pH, Arterial: 7.369 (ref 7.350–7.450)
pH, Arterial: 7.455 — ABNORMAL HIGH (ref 7.350–7.450)
pH, Arterial: 7.492 — ABNORMAL HIGH (ref 7.350–7.450)
pO2, Arterial: 67 mmHg — ABNORMAL LOW (ref 83.0–108.0)
pO2, Arterial: 64 mmHg — ABNORMAL LOW (ref 83.0–108.0)
pO2, Arterial: 66 mmHg — ABNORMAL LOW (ref 83.0–108.0)
pO2, Arterial: 74 mmHg — ABNORMAL LOW (ref 83.0–108.0)

## 2017-04-17 LAB — PREPARE FRESH FROZEN PLASMA
UNIT DIVISION: 0
UNIT DIVISION: 0
Unit division: 0
Unit division: 0

## 2017-04-17 LAB — PREPARE CRYOPRECIPITATE
UNIT DIVISION: 0
Unit division: 0

## 2017-04-17 LAB — CBC
HCT: 30.7 % — ABNORMAL LOW (ref 36.0–46.0)
HCT: 32.7 % — ABNORMAL LOW (ref 36.0–46.0)
HEMOGLOBIN: 11.1 g/dL — AB (ref 12.0–15.0)
Hemoglobin: 10.7 g/dL — ABNORMAL LOW (ref 12.0–15.0)
MCH: 28.8 pg (ref 26.0–34.0)
MCH: 28.5 pg (ref 26.0–34.0)
MCHC: 34.9 g/dL (ref 30.0–36.0)
MCHC: 33.9 g/dL (ref 30.0–36.0)
MCV: 82.7 fL (ref 78.0–100.0)
MCV: 84.1 fL (ref 78.0–100.0)
PLATELETS: 96 10*3/uL — AB (ref 150–400)
PLATELETS: 94 10*3/uL — AB (ref 150–400)
RBC: 3.71 MIL/uL — AB (ref 3.87–5.11)
RBC: 3.89 MIL/uL (ref 3.87–5.11)
RDW: 15.1 % (ref 11.5–15.5)
RDW: 16.2 % — ABNORMAL HIGH (ref 11.5–15.5)
WBC: 11 10*3/uL — AB (ref 4.0–10.5)
WBC: 16.2 10*3/uL — ABNORMAL HIGH (ref 4.0–10.5)

## 2017-04-17 LAB — BLOOD GAS, ARTERIAL
Acid-base deficit: 3.6 mmol/L — ABNORMAL HIGH (ref 0.0–2.0)
Bicarbonate: 21.3 mmol/L (ref 20.0–28.0)
Drawn by: 511471
FIO2: 100
O2 Content: 15 L/min
O2 Saturation: 88.4 %
Patient temperature: 98.1
pCO2 arterial: 40.5 mmHg (ref 32.0–48.0)
pH, Arterial: 7.34 — ABNORMAL LOW (ref 7.350–7.450)
pO2, Arterial: 59 mmHg — ABNORMAL LOW (ref 83.0–108.0)

## 2017-04-17 LAB — GLUCOSE, CAPILLARY
GLUCOSE-CAPILLARY: 100 mg/dL — AB (ref 65–99)
GLUCOSE-CAPILLARY: 106 mg/dL — AB (ref 65–99)
GLUCOSE-CAPILLARY: 118 mg/dL — AB (ref 65–99)
GLUCOSE-CAPILLARY: 143 mg/dL — AB (ref 65–99)
GLUCOSE-CAPILLARY: 162 mg/dL — AB (ref 65–99)
GLUCOSE-CAPILLARY: 87 mg/dL (ref 65–99)
GLUCOSE-CAPILLARY: 95 mg/dL (ref 65–99)
Glucose-Capillary: 107 mg/dL — ABNORMAL HIGH (ref 65–99)
Glucose-Capillary: 116 mg/dL — ABNORMAL HIGH (ref 65–99)
Glucose-Capillary: 121 mg/dL — ABNORMAL HIGH (ref 65–99)
Glucose-Capillary: 122 mg/dL — ABNORMAL HIGH (ref 65–99)
Glucose-Capillary: 130 mg/dL — ABNORMAL HIGH (ref 65–99)
Glucose-Capillary: 133 mg/dL — ABNORMAL HIGH (ref 65–99)
Glucose-Capillary: 141 mg/dL — ABNORMAL HIGH (ref 65–99)
Glucose-Capillary: 136 mg/dL — ABNORMAL HIGH (ref 65–99)
Glucose-Capillary: 114 mg/dL — ABNORMAL HIGH (ref 65–99)
Glucose-Capillary: 80 mg/dL (ref 65–99)
Glucose-Capillary: 124 mg/dL — ABNORMAL HIGH (ref 65–99)
Glucose-Capillary: 160 mg/dL — ABNORMAL HIGH (ref 65–99)

## 2017-04-17 LAB — BPAM FFP
BLOOD PRODUCT EXPIRATION DATE: 201810142359
Blood Product Expiration Date: 201810122359
Blood Product Expiration Date: 201810142359
Blood Product Expiration Date: 201810142359
ISSUE DATE / TIME: 201810091702
ISSUE DATE / TIME: 201810091702
ISSUE DATE / TIME: 201810092130
ISSUE DATE / TIME: 201810092130
UNIT TYPE AND RH: 6200
UNIT TYPE AND RH: 6200
UNIT TYPE AND RH: 6200
Unit Type and Rh: 6200

## 2017-04-17 LAB — BPAM CRYOPRECIPITATE
BLOOD PRODUCT EXPIRATION DATE: 201810100332
Blood Product Expiration Date: 201810100332
ISSUE DATE / TIME: 201810092145
ISSUE DATE / TIME: 201810092145
UNIT TYPE AND RH: 6200
Unit Type and Rh: 6200

## 2017-04-17 LAB — POCT I-STAT, CHEM 8
BUN: 17 mg/dL (ref 6–20)
BUN: 22 mg/dL — ABNORMAL HIGH (ref 6–20)
CALCIUM ION: 1.06 mmol/L — AB (ref 1.15–1.40)
Calcium, Ion: 1.15 mmol/L (ref 1.15–1.40)
Chloride: 107 mmol/L (ref 101–111)
Chloride: 107 mmol/L (ref 101–111)
Creatinine, Ser: 1 mg/dL (ref 0.44–1.00)
Creatinine, Ser: 1.4 mg/dL — ABNORMAL HIGH (ref 0.44–1.00)
Glucose, Bld: 133 mg/dL — ABNORMAL HIGH (ref 65–99)
Glucose, Bld: 141 mg/dL — ABNORMAL HIGH (ref 65–99)
HCT: 26 % — ABNORMAL LOW (ref 36.0–46.0)
HCT: 33 % — ABNORMAL LOW (ref 36.0–46.0)
HEMOGLOBIN: 8.8 g/dL — AB (ref 12.0–15.0)
Hemoglobin: 11.2 g/dL — ABNORMAL LOW (ref 12.0–15.0)
Potassium: 3.3 mmol/L — ABNORMAL LOW (ref 3.5–5.1)
Potassium: 3.7 mmol/L (ref 3.5–5.1)
SODIUM: 144 mmol/L (ref 135–145)
Sodium: 147 mmol/L — ABNORMAL HIGH (ref 135–145)
TCO2: 25 mmol/L (ref 22–32)
TCO2: 22 mmol/L (ref 22–32)

## 2017-04-17 LAB — PREPARE PLATELET PHERESIS
Unit division: 0
Unit division: 0

## 2017-04-17 LAB — BASIC METABOLIC PANEL
Anion gap: 11 (ref 5–15)
BUN: 18 mg/dL (ref 6–20)
CALCIUM: 7.8 mg/dL — AB (ref 8.9–10.3)
CHLORIDE: 110 mmol/L (ref 101–111)
CO2: 23 mmol/L (ref 22–32)
CREATININE: 1.26 mg/dL — AB (ref 0.44–1.00)
GFR, EST AFRICAN AMERICAN: 46 mL/min — AB (ref >60)
GFR, EST NON AFRICAN AMERICAN: 40 mL/min — AB (ref >60)
Glucose, Bld: 126 mg/dL — ABNORMAL HIGH (ref 65–99)
Potassium: 3.2 mmol/L — ABNORMAL LOW (ref 3.5–5.1)
SODIUM: 144 mmol/L (ref 135–145)

## 2017-04-17 LAB — BLOOD PRODUCT ORDER (VERBAL) VERIFICATION

## 2017-04-17 LAB — BPAM PLATELET PHERESIS
BLOOD PRODUCT EXPIRATION DATE: 201810102359
BLOOD PRODUCT EXPIRATION DATE: 201810112359
ISSUE DATE / TIME: 201810091702
ISSUE DATE / TIME: 201810091702
UNIT TYPE AND RH: 7300
Unit Type and Rh: 6200

## 2017-04-17 LAB — CREATININE, SERUM
CREATININE: 1.58 mg/dL — AB (ref 0.44–1.00)
GFR calc Af Amer: 35 mL/min — ABNORMAL LOW (ref >60)
GFR, EST NON AFRICAN AMERICAN: 30 mL/min — AB (ref >60)

## 2017-04-17 LAB — MAGNESIUM
MAGNESIUM: 2.5 mg/dL — AB (ref 1.7–2.4)
MAGNESIUM: 2.2 mg/dL (ref 1.7–2.4)

## 2017-04-17 MED ORDER — SODIUM BICARBONATE 8.4 % IV SOLN
INTRAVENOUS | Status: AC
  Administered 2017-04-17: 20:00:00
  Filled 2017-04-17: qty 50

## 2017-04-17 MED ORDER — SODIUM CHLORIDE 0.9% FLUSH
10.0000 mL | Freq: Two times a day (BID) | INTRAVENOUS | Status: DC
  Administered 2017-04-18 – 2017-04-21 (×4): 10 mL

## 2017-04-17 MED ORDER — POTASSIUM CHLORIDE 10 MEQ/50ML IV SOLN
10.0000 meq | INTRAVENOUS | Status: AC
  Administered 2017-04-17 (×3): 10 meq via INTRAVENOUS
  Filled 2017-04-17 (×3): qty 50

## 2017-04-17 MED ORDER — ALBUMIN HUMAN 5 % IV SOLN
12.5000 g | Freq: Once | INTRAVENOUS | Status: AC
Start: 2017-04-17 — End: 2017-04-17
  Administered 2017-04-17 (×2): 12.5 g via INTRAVENOUS

## 2017-04-17 MED ORDER — SODIUM CHLORIDE 0.9% FLUSH: 10.0000 mL | INTRAVENOUS | Status: DC | PRN

## 2017-04-17 MED ORDER — ORAL CARE MOUTH RINSE
15.0000 mL | Freq: Two times a day (BID) | OROMUCOSAL | Status: DC
  Administered 2017-04-17: 15 mL via OROMUCOSAL

## 2017-04-17 MED ORDER — MILRINONE LACTATE IN DEXTROSE 20-5 MG/100ML-% IV SOLN: 0.2000 ug/kg/min | INTRAVENOUS | Status: DC

## 2017-04-17 MED ORDER — CHLORHEXIDINE GLUCONATE CLOTH 2 % EX PADS
6.0000 | MEDICATED_PAD | Freq: Every day | CUTANEOUS | Status: DC
  Administered 2017-04-18 – 2017-04-21 (×4): 6 via TOPICAL

## 2017-04-17 MED ORDER — ALBUMIN HUMAN 5 % IV SOLN
INTRAVENOUS | Status: AC
  Administered 2017-04-17: 12.5 g via INTRAVENOUS
  Filled 2017-04-17: qty 250

## 2017-04-17 MED ORDER — ALBUMIN HUMAN 5 % IV SOLN
12.5000 g | Freq: Once | INTRAVENOUS | Status: AC
  Administered 2017-04-17: 12.5 g via INTRAVENOUS

## 2017-04-17 MED ORDER — INSULIN ASPART 100 UNIT/ML ~~LOC~~ SOLN
0.0000 [IU] | SUBCUTANEOUS | Status: DC
  Administered 2017-04-17 – 2017-04-18 (×6): 2 [IU] via SUBCUTANEOUS
  Administered 2017-04-18: 4 [IU] via SUBCUTANEOUS
  Administered 2017-04-19: 8 [IU] via SUBCUTANEOUS
  Administered 2017-04-20 (×2): 4 [IU] via SUBCUTANEOUS
  Administered 2017-04-20: 8 [IU] via SUBCUTANEOUS
  Administered 2017-04-20: 2 [IU] via SUBCUTANEOUS
  Administered 2017-04-20 (×2): 8 [IU] via SUBCUTANEOUS
  Administered 2017-04-21: 0 [IU] via SUBCUTANEOUS
  Administered 2017-04-21: 8 [IU] via SUBCUTANEOUS

## 2017-04-17 MED ORDER — SODIUM BICARBONATE 8.4 % IV SOLN
25.0000 meq | Freq: Once | INTRAVENOUS | Status: AC
  Administered 2017-04-17: 25 meq via INTRAVENOUS

## 2017-04-17 MED ORDER — DEXMEDETOMIDINE HCL IN NACL 400 MCG/100ML IV SOLN: 0.0000 ug/kg/h | INTRAVENOUS | Status: DC

## 2017-04-17 MED ORDER — FUROSEMIDE 10 MG/ML IJ SOLN
40.0000 mg | Freq: Two times a day (BID) | INTRAMUSCULAR | Status: DC
  Administered 2017-04-17 – 2017-04-18 (×2): 40 mg via INTRAVENOUS
  Filled 2017-04-17 (×2): qty 4

## 2017-04-17 MED FILL — Electrolyte-R (PH 7.4) Solution: INTRAVENOUS | Qty: 4000 | Status: AC

## 2017-04-17 MED FILL — Sodium Chloride IV Soln 0.9%: INTRAVENOUS | Qty: 2000 | Status: AC

## 2017-04-17 MED FILL — Sodium Bicarbonate IV Soln 8.4%: INTRAVENOUS | Qty: 50 | Status: AC

## 2017-04-17 MED FILL — Heparin Sodium (Porcine) Inj 1000 Unit/ML: INTRAMUSCULAR | Qty: 30 | Status: AC

## 2017-04-17 MED FILL — Albumin, Human Inj 5%: INTRAVENOUS | Qty: 250 | Status: AC

## 2017-04-17 MED FILL — Sodium Chloride IV Soln 0.9%: INTRAVENOUS | Qty: 3000 | Status: AC

## 2017-04-17 MED FILL — Mannitol IV Soln 20%: INTRAVENOUS | Qty: 500 | Status: AC

## 2017-04-17 NOTE — Progress Notes (Signed)
Pt not following commands due to mental status and unable to perform weaning parameters. Placed back on SIMV/PRVC per Dr. Cornelius Moras.

## 2017-04-17 NOTE — Progress Notes (Signed)
Attempted mechanics again on PS 10 and CPAP 5 after 20 minutes. Pt unable to preform NIF or VC. Pt is not following commands at this time.

## 2017-04-17 NOTE — Progress Notes (Signed)
[address]       [place] [address]             [phone]        CARDIOTHORACIC SURGERY PROGRESS NOTE   R1 Day Post-Op Procedure(s) (LRB): RE-EXPLORATION RIGHT THORACOTOMY FOR BLEEDING (Right)  Subjective: Sedated on vent  Objective: Vital signs: BP Readings from Last 1 Encounters:  04/17/17 111/66   Pulse Readings from Last 1 Encounters:  04/17/17 80   Resp Readings from Last 1 Encounters:  04/17/17 17   Temp Readings from Last 1 Encounters:  04/17/17 97.7 F (36.5 C)    Hemodynamics: PAP: (20-42)/(11-23) 29/12 CO:  [2 L/min-3.4 L/min] 3 L/min CI:  [1.2 L/min/m2-2 L/min/m2] 1.7 L/min/m2  Physical Exam:  Rhythm:   paced  Breath sounds: clear  Heart sounds:  RRR w/out murmur  Incisions:  Dressings dry, intact  Abdomen:  Soft, non-distended  Extremities:  Warm, well-perfused  Chest tubes:  low volume thin serosanguinous output, no air leak    Intake/Output from previous day: 10/09 0701 - 10/10 0700 In: 9770.1 [I.V.:4226.7; Blood:3343.4; NG/GT:50; IV Piggyback:2150] Out: 5485 [Urine:2895; Emesis/NG output:300; Blood:600; Chest Tube:1690] Intake/Output this shift: Total I/O In: 598.6 [I.V.:298.6; IV Piggyback:300] Out: 175 [Urine:125; Chest Tube:50]  Lab Results:  CBC: Recent Labs  04/16/17 2244 04/16/17 2259 04/17/17 0354  WBC 10.8*  --  11.0*  HGB 10.9* 9.9* 10.7*  HCT 32.3* 29.0* 30.7*  PLT 78*  --  96*    BMET:  Recent Labs  04/16/17 2244 04/16/17 2259 04/17/17 0354  NA 139 144 144  K 3.0* 3.1* 3.2*  CL 109  --  110  CO2 23  --  23  GLUCOSE 139* 134* 126*  BUN 16  --  18  CREATININE 1.17*  --  1.26*  CALCIUM 7.4*  --  7.8*     PT/INR:   Recent Labs  04/16/17 2244  LABPROT 11.8  INR 0.87    CBG (last 3)   Recent Labs  04/17/17 0605 04/17/17 0658 04/17/17 0810  GLUCAP 162* 136* 114*    ABG    Component Value Date/Time   PHART 7.492 (H) 04/17/2017 0407   PCO2ART 30.4 (L) 04/17/2017 0407    PO2ART 72.0 (L) 04/17/2017 0407   HCO3 23.5 04/17/2017 0407   TCO2 24 04/17/2017 0407   ACIDBASEDEF 1.0 04/16/2017 2359   O2SAT 96.0 04/17/2017 0407    CXR: PORTABLE CHEST 1 VIEW  COMPARISON:  Portable chest x-ray of April 16, 2017  FINDINGS: The left lung is well-expanded though there has developed left basilar density likely reflecting atelectasis. Small amounts of pleural fluid are present at both bases. There is persistent alveolar opacity projecting over the right mid lung and coarse interstitial lung markings at the right base. The right apical and left basilar chest tubes are in stable position. The heart is top-normal in size. The central pulmonary vascularity is mildly prominent but less engorged than on the earlier study. The prosthetic mitral valve ring, the left atrial appendage clip, the ICD, and the Swan-Ganz catheter are in stable position. There is calcification in the wall of the aortic arch. The endotracheal tube tip lies approximately 1.8 cm above the carina. The esophagogastric tube tip projects below the inferior margin of the image.  IMPRESSION: Slight interval improvement in the pulmonary interstitium. Persistent confluent alveolar density in the right mid lung. Increasing density at the left lung base worrisome for atelectasis or pneumonia. Small pleural effusions.  The support tubes are in reasonable position.  Thoracic aortic atherosclerosis.   Electronically Signed   By: David  [place] M.D.  Assessment/Plan: S/P Procedure(s) (LRB): RE-EXPLORATION RIGHT THORACOTOMY FOR BLEEDING (Right)  Overall stable POD1 Maintaining paced rhythm w/ stable hemodynamics on low dose milrinone and NTG drips, PA pressures relatively low O2 sats 98-99% on 50% FiO2 and CXR looks good Making adequate UOP and creatinine at or slightly below preop baseline Expected post op acute blood loss anemia, Hgb 10.7 stable Post op thrombocytopenia, platelet count 96k  this morning Chronic diastolic CHF with expected post-op volume excess, weight reportedly 18 lbs > preop   Proceed with acute vent wean  Mobilize once off vent   Purcell Nails, MD 04/17/2017 9:20 AM

## 2017-04-17 NOTE — Transfer of Care (Signed)
Immediate Anesthesia Transfer of Care Note  Patient: Amanda [name]  Procedure(s) Performed: RE-EXPLORATION RIGHT THORACOTOMY FOR BLEEDING (Right Chest)  Patient Location: ICU  Anesthesia Type:General  Level of Consciousness: Patient remains intubated per anesthesia plan  Airway & Oxygen Therapy: Patient remains intubated per anesthesia plan and Patient placed on Ventilator (see vital sign flow sheet for setting)  Post-op Assessment: Report given to RN and Post -op Vital signs reviewed and stable  Post vital signs: Reviewed and stable  Last Vitals:  Vitals:   04/17/17 2026 04/17/17 2100  BP: (!) 150/115 (!) 148/88  Pulse: 70 73  Resp: 20 (!) 34  Temp: 36.7 C   SpO2: 96% 92%    Last Pain:  Vitals:   04/17/17 2026  TempSrc: Oral      Patients Stated Pain Goal: 0 (04/17/17 1600)  Complications: No apparent anesthesia complications

## 2017-04-17 NOTE — Progress Notes (Signed)
Pt extubated around 1540 to 6L [place], intermittently agitated/confused but easily reoriented. Post intubation ABG stable. She became increasingly more agitated around 530pm and SpO2 dropped to 86-88%. Pt was placed on venturi mask and later changed to NRB with Sats 92%. Spoke with Dr. Tyrone Sage during pm rounds, stat ABG obtained, 1/2amp of bicarb given. Repeat ABG in an hour and monitor closely for further decompensation. Pt's daughters updated at the bedside during pm rounds.

## 2017-04-17 NOTE — Anesthesia Postprocedure Evaluation (Signed)
Anesthesia Post Note  Patient: Amanda [name]  Procedure(s) Performed: RE-EXPLORATION RIGHT THORACOTOMY FOR BLEEDING (Right Chest)     Patient location during evaluation: PACU Anesthesia Type: General Level of consciousness: sedated and patient cooperative Pain management: pain level controlled Vital Signs Assessment: post-procedure vital signs reviewed and stable Respiratory status: spontaneous breathing Cardiovascular status: stable Anesthetic complications: no    Last Vitals:  Vitals:   04/17/17 0000 04/17/17 0015  BP: 127/75   Pulse: 80 88  Resp: 12 12  Temp: (!) 34.3 C (!) 34.6 C  SpO2: 96% 95%    Last Pain:  Vitals:   04/17/17 0000  TempSrc: Core (Comment)                 Lewie Loron

## 2017-04-17 NOTE — Progress Notes (Signed)
Patient ID: Amanda [name], female   DOB: 04-Nov-1940, 76 y.o.   MRN: [id number] EVENING ROUNDS NOTE :     [address]       [place] [address]             [phone]                 1 Day Post-Op Procedure(s) (LRB): RE-EXPLORATION RIGHT THORACOTOMY FOR BLEEDING (Right)  Total Length of Stay:  LOS: 1 day  BP (!) 145/77   Pulse 70   Temp 97.7 F (36.5 C) (Oral)   Resp (!) 22   Ht 5' 6.5" (1.689 m)   Wt 159 lb 9.8 oz (72.4 kg)   SpO2 93%   BMI 25.38 kg/m   .Intake/Output      10/10 0701 - 10/11 0700   I.V. (mL/kg) 781.6 (10.8)   IV Piggyback 650   Total Intake(mL/kg) 1431.6 (19.8)   Urine (mL/kg/hr) 355 (0.4)   Chest Tube 570   Total Output 925   Net +506.6         . sodium chloride    . cefUROXime (ZINACEF)  IV Stopped (04/17/17 1838)  . lactated ringers    . lactated ringers Stopped (04/17/17 1600)  . milrinone    . nitroGLYCERIN Stopped (04/17/17 0800)  . potassium chloride 10 mEq (04/17/17 1838)     Lab Results  Component Value Date   WBC 16.2 (H) 04/17/2017   HGB 11.2 (L) 04/17/2017   HCT 33.0 (L) 04/17/2017   PLT 94 (L) 04/17/2017   GLUCOSE 141 (H) 04/17/2017   CHOL 153 01/07/2015   TRIG 67 01/07/2015   HDL 51 01/07/2015   LDLCALC 89 01/07/2015   ALT 18 04/12/2017   AST 19 04/12/2017   NA 147 (H) 04/17/2017   K 3.7 04/17/2017   CL 107 04/17/2017   CREATININE 1.40 (H) 04/17/2017   BUN 22 (H) 04/17/2017   CO2 23 04/17/2017   TSH 1.510 03/13/2017   INR 0.87 04/16/2017   HGBA1C 5.8 (H) 04/12/2017   Extubated this afternoon, o2 sats ok but confused and agitated  On non re breather mask now  Will check abg High risk of reentubation Keep npo With confusion and agitation doubt will tolerate bipap Discussed with patients daughters   Delight Ovens MD  Beeper [phone] Office [phone] 04/17/2017 7:07 PM

## 2017-04-17 NOTE — Progress Notes (Signed)
Attempted weaning to extubate per protocol. Pt tolerated CPAP/PS for 20 minutes with  follow up ABG within parameters. Mental status waxes and wanes, she is alert and agitated and unable to follow most commands. RT at bedside assessing mechanics, pt with -20 NIF and no VC. Pt's hemodynamics remain stable but urine output has decreased to 15-49ml/hr. Dr Cornelius Moras paged with updates. Orders to flip back to [facility] and re-attempt wean in one hour.

## 2017-04-17 NOTE — Progress Notes (Signed)
Pt failed mechanics second time. Unable to perform NIF and VC. Mental status continues to wax and wane but following more commands intermittently. Dr Cornelius Moras at the bedside, orders to extubate. RT called and updated.

## 2017-04-17 NOTE — Procedures (Signed)
Extubation Procedure Note  Patient Details:   Name: Amanda [name] DOB: [birth year] MRN: [id number]   Airway Documentation:   Positive air leak prior to extubation.  Evaluation  O2 sats: stable throughout Complications: No apparent complications Patient did tolerate procedure well. Bilateral Breath Sounds: Diminished, Clear   Yes   Pt able to speak after extubation. No stridor or distress noted. RN present.   Julieanne Manson 04/17/2017, 3:48 PM

## 2017-04-18 ENCOUNTER — Inpatient Hospital Stay (HOSPITAL_COMMUNITY): Payer: Medicare Other

## 2017-04-18 LAB — PREPARE PLATELET PHERESIS: Unit division: 0

## 2017-04-18 LAB — BPAM PLATELET PHERESIS
BLOOD PRODUCT EXPIRATION DATE: 201810112359
ISSUE DATE / TIME: 201810100023
UNIT TYPE AND RH: 6200

## 2017-04-18 LAB — GLUCOSE, CAPILLARY
GLUCOSE-CAPILLARY: 122 mg/dL — AB (ref 65–99)
GLUCOSE-CAPILLARY: 157 mg/dL — AB (ref 65–99)
GLUCOSE-CAPILLARY: 176 mg/dL — AB (ref 65–99)
Glucose-Capillary: 148 mg/dL — ABNORMAL HIGH (ref 65–99)
Glucose-Capillary: 123 mg/dL — ABNORMAL HIGH (ref 65–99)

## 2017-04-18 LAB — BLOOD GAS, ARTERIAL
Acid-base deficit: 1.3 mmol/L (ref 0.0–2.0)
Bicarbonate: 23.3 mmol/L (ref 20.0–28.0)
Delivery systems: POSITIVE
Drawn by: 511471
Expiratory PAP: 5
FIO2: 0.8
Inspiratory PAP: 10
O2 Saturation: 91 %
PEEP: 5 cmH2O
Patient temperature: 97.5
RATE: 4 resp/min
pCO2 arterial: 40.6 mmHg (ref 32.0–48.0)
pH, Arterial: 7.373 (ref 7.350–7.450)
pO2, Arterial: 61.6 mmHg — ABNORMAL LOW (ref 83.0–108.0)

## 2017-04-18 LAB — BASIC METABOLIC PANEL
Anion gap: 10 (ref 5–15)
BUN: 24 mg/dL — AB (ref 6–20)
CHLORIDE: 111 mmol/L (ref 101–111)
CO2: 25 mmol/L (ref 22–32)
CREATININE: 1.75 mg/dL — AB (ref 0.44–1.00)
Calcium: 8.6 mg/dL — ABNORMAL LOW (ref 8.9–10.3)
GFR calc Af Amer: 31 mL/min — ABNORMAL LOW (ref >60)
GFR calc non Af Amer: 27 mL/min — ABNORMAL LOW (ref >60)
GLUCOSE: 178 mg/dL — AB (ref 65–99)
POTASSIUM: 3.6 mmol/L (ref 3.5–5.1)
SODIUM: 146 mmol/L — AB (ref 135–145)

## 2017-04-18 LAB — POTASSIUM: POTASSIUM: 3 mmol/L — AB (ref 3.5–5.1)

## 2017-04-18 LAB — CBC
HEMATOCRIT: 34 % — AB (ref 36.0–46.0)
Hemoglobin: 11.6 g/dL — ABNORMAL LOW (ref 12.0–15.0)
MCH: 28.9 pg (ref 26.0–34.0)
MCHC: 34.1 g/dL (ref 30.0–36.0)
MCV: 84.8 fL (ref 78.0–100.0)
PLATELETS: 96 10*3/uL — AB (ref 150–400)
RBC: 4.01 MIL/uL (ref 3.87–5.11)
RDW: 16.6 % — AB (ref 11.5–15.5)
WBC: 24.7 10*3/uL — AB (ref 4.0–10.5)

## 2017-04-18 MED ORDER — CHLORHEXIDINE GLUCONATE 0.12 % MT SOLN
15.0000 mL | Freq: Two times a day (BID) | OROMUCOSAL | Status: DC
  Administered 2017-04-18 – 2017-04-26 (×13): 15 mL via OROMUCOSAL
  Filled 2017-04-18 (×10): qty 15

## 2017-04-18 MED ORDER — POTASSIUM CHLORIDE 10 MEQ/50ML IV SOLN
10.0000 meq | INTRAVENOUS | Status: AC
  Administered 2017-04-18 (×3): 10 meq via INTRAVENOUS
  Filled 2017-04-18 (×3): qty 50

## 2017-04-18 MED ORDER — DEXMEDETOMIDINE HCL IN NACL 400 MCG/100ML IV SOLN
0.0000 ug/kg/h | INTRAVENOUS | Status: DC
  Administered 2017-04-18: 0.2 ug/kg/h via INTRAVENOUS
  Administered 2017-04-19: 0.4 ug/kg/h via INTRAVENOUS
  Filled 2017-04-18 (×2): qty 100

## 2017-04-18 MED ORDER — PANTOPRAZOLE SODIUM 40 MG IV SOLR
40.0000 mg | INTRAVENOUS | Status: DC
  Administered 2017-04-19 – 2017-04-25 (×8): 40 mg via INTRAVENOUS
  Filled 2017-04-18 (×7): qty 40

## 2017-04-18 MED ORDER — ORAL CARE MOUTH RINSE
15.0000 mL | Freq: Two times a day (BID) | OROMUCOSAL | Status: DC
  Administered 2017-04-18 – 2017-04-26 (×16): 15 mL via OROMUCOSAL

## 2017-04-18 MED ORDER — LEVOTHYROXINE SODIUM 75 MCG PO TABS
75.0000 ug | ORAL_TABLET | Freq: Every day | ORAL | Status: DC
  Administered 2017-04-21 – 2017-04-26 (×7): 75 ug via ORAL
  Filled 2017-04-18 (×7): qty 1

## 2017-04-18 MED ORDER — FUROSEMIDE 10 MG/ML IJ SOLN
80.0000 mg | Freq: Three times a day (TID) | INTRAMUSCULAR | Status: DC
  Administered 2017-04-18 – 2017-04-19 (×3): 80 mg via INTRAVENOUS
  Filled 2017-04-18 (×3): qty 8

## 2017-04-18 MED ORDER — LABETALOL HCL 5 MG/ML IV SOLN
10.0000 mg | INTRAVENOUS | Status: DC | PRN
  Administered 2017-04-20: 10 mg via INTRAVENOUS
  Filled 2017-04-18: qty 4

## 2017-04-18 MED FILL — Heparin Sodium (Porcine) Inj 1000 Unit/ML: INTRAMUSCULAR | Qty: 30 | Status: AC

## 2017-04-18 MED FILL — Magnesium Sulfate Inj 50%: INTRAMUSCULAR | Qty: 10 | Status: AC

## 2017-04-18 MED FILL — Potassium Chloride Inj 2 mEq/ML: INTRAVENOUS | Qty: 40 | Status: AC

## 2017-04-18 MED FILL — Heparin Sodium (Porcine) Inj 1000 Unit/ML: INTRAMUSCULAR | Qty: 2500 | Status: AC

## 2017-04-18 MED FILL — Dexmedetomidine HCl in NaCl 0.9% IV Soln 400 MCG/100ML: INTRAVENOUS | Qty: 100 | Status: AC

## 2017-04-18 NOTE — Progress Notes (Signed)
RN called me d/t concern for possible apnea episode.  Upon entering room, no noted apnea alarms and pt breathing spont 20-22 bpm.  Placed pt on NIV/PC w/ set rate.  Pt tol well.  RN and I placed bandages on facial skin d/t redness and pressure marks noted.  RN paged Dr Cornelius Moras d/t concerns re: respiratory and mental status.  No new RT orders received.  Currently no respiratory distress noted.  Fio2 weaned ot 50%, sat 97%.

## 2017-04-18 NOTE — Progress Notes (Signed)
Initial Nutrition Assessment  INTERVENTION:   If able to advance diet will supplement as appropriate.    NUTRITION DIAGNOSIS:   Inadequate oral intake related to inability to eat as evidenced by NPO status.  GOAL:   Patient will meet greater than or equal to 90% of their needs  MONITOR:   Diet advancement, I & O's  REASON FOR ASSESSMENT:   Low Braden    ASSESSMENT:   Pt with PMH of HTN, CKD stage III, heart block s/p pacemater, afib, esophageal dysmotility s/p EGD with esophageal dilatation, severe MV prolapse admitted for MV repair and MAZE procedure on 10/9. Pt extubated 10/10 but remains on BiPAP.    Spoke with husband and daughter at bedside. They report that pt was eating well PTA. Had some recent weight loss after given lasix. Usual weight 140 lb. Pt very active at home.  Unable to achieve enteral access due to BiPAP. Pt agitated per family. Pt with mittens on.   Nutrition-Focused physical exam completed. Findings are no fat depletion, no muscle depletion, and mild edema.   Medications reviewed and include: dulcolax, SSI, synthroid  Labs reviewed: Na 146 (H) CBG's: [phone]  Diet Order:  Diet NPO time specified  Skin:   (incisions)  Last BM:  PTA  Height:   Ht Readings from Last 1 Encounters:  04/16/17 5' 6.5" (1.689 m)    Weight:   Wt Readings from Last 1 Encounters:  04/18/17 155 lb 13.8 oz (70.7 kg)    Ideal Body Weight:  60.2 kg  BMI:  Body mass index is 24.78 kg/m.  Estimated Nutritional Needs:   Kcal:  1600-1800  Protein:  80-95 grams  Fluid:  > 1.6 L/day  EDUCATION NEEDS:   No education needs identified at this time  Kendell Bane RD, LDN, CNSC [phone] Pager [phone] After Hours Pager

## 2017-04-18 NOTE — Anesthesia Postprocedure Evaluation (Signed)
Anesthesia Post Note  Patient: Amanda [name]  Procedure(s) Performed: MINIMALLY INVASIVE MITRAL VALVE REPAIR (MVR) (Right Chest) MINIMALLY INVASIVE MAZE PROCEDURE (N/A Chest) TRANSESOPHAGEAL ECHOCARDIOGRAM (TEE) (N/A ) CLIPPING OF ATRIAL APPENDAGE using AtriCure Pro2 clip 45 (Chest)     Patient location during evaluation: SICU Anesthesia Type: General Level of consciousness: sedated and patient uncooperative Pain management: pain level controlled Vital Signs Assessment: post-procedure vital signs reviewed and stable Respiratory status: spontaneous breathing, nonlabored ventilation and respiratory function stable (requiring CPAP, O2 sat 97%) Cardiovascular status: blood pressure returned to baseline and stable Postop Assessment: no apparent nausea or vomiting Anesthetic complications: yes (pt required re-exploration post op Day 0, has been confused post op , requiring CPAP and sedation)    Last Vitals:  Vitals:   04/18/17 1155 04/18/17 1255  BP:  (!) 159/86  Pulse: 70 70  Resp: (!) 27 (!) 21  Temp:    SpO2: 95% 98%    Last Pain:  Vitals:   04/18/17 0400  TempSrc: Axillary                 Amanda [name],E. Shellene Sweigert

## 2017-04-18 NOTE — Progress Notes (Signed)
TCTS BRIEF SICU PROGRESS NOTE   R2 Days Post-OpS/P Procedure(s) (LRB): MINIMALLY INVASIVE MITRAL VALVE REPAIR (MVR) (Right) MINIMALLY INVASIVE MAZE PROCEDURE (N/A) TRANSESOPHAGEAL ECHOCARDIOGRAM (TEE) (N/A) CLIPPING OF ATRIAL APPENDAGE using AtriCure Pro2 clip 45  2 Days Post-Op  S/P Procedure(s) (LRB): RE-EXPLORATION RIGHT THORACOTOMY FOR BLEEDING (Right)   Stable day Resting comfortably on low dose Precedex - became agitated w/ attempts to wean earlier today AV paced rhythm w/ stable BP Diuresing well  Plan: Continue current plan  Purcell Nails, MD 04/18/2017 6:24 PM

## 2017-04-18 NOTE — Progress Notes (Signed)
Pt very agitated and restless in the bed. MD paged and made aware and ordered to maintain precedex dosage at this time.  VSS. Will continue to monitor.

## 2017-04-18 NOTE — Progress Notes (Signed)
[address]       [name] [address]             [phone]        CARDIOTHORACIC SURGERY PROGRESS NOTE   R2 Days Post-Op  S/P Procedure(s) (LRB): MINIMALLY INVASIVE MITRAL VALVE REPAIR (MVR) (Right) MINIMALLY INVASIVE MAZE PROCEDURE (N/A) TRANSESOPHAGEAL ECHOCARDIOGRAM (TEE) (N/A) CLIPPING OF ATRIAL APPENDAGE using AtriCure Pro2 clip 45    R2 Days Post-Op Procedure(s) (LRB): RE-EXPLORATION RIGHT THORACOTOMY FOR BLEEDING (Right)  Subjective: Reportedly agitated overnight.  Currently lightly sedated on Precedex drip.  Opens eyes and follows some simple commands.  Objective: Vital signs: BP Readings from Last 1 Encounters:  04/18/17 140/81   Pulse Readings from Last 1 Encounters:  04/18/17 69   Resp Readings from Last 1 Encounters:  04/18/17 (!) 21   Temp Readings from Last 1 Encounters:  04/18/17 (!) 97.5 F (36.4 C) (Axillary)    Hemodynamics: PAP: (25-48)/(9-25) 46/16 CO:  [2.9 L/min-3.2 L/min] 3.2 L/min CI:  [1.7 L/min/m2-1.9 L/min/m2] 1.9 L/min/m2  Physical Exam:  Rhythm:   AV paced  Breath sounds: Shallow but clear  Heart sounds:  RRR w/out murmur  Incisions:  Dressings dry, intact  Abdomen:  Soft, non-distended, non-tender  Extremities:  Warm, well-perfused  Chest tubes:  low volume thin serosanguinous output, no air leak    Intake/Output from previous day: 10/10 0701 - 10/11 0700 In: 1807.3 [I.V.:957.3; IV Piggyback:850] Out: 2665 [Urine:1545; Chest Tube:1120] Intake/Output this shift: No intake/output data recorded.  Lab Results:  CBC: Recent Labs  04/17/17 1630 04/17/17 1651 04/18/17 0435  WBC 16.2*  --  24.7*  HGB 11.1* 11.2* 11.6*  HCT 32.7* 33.0* 34.0*  PLT 94*  --  96*    BMET:  Recent Labs  04/17/17 0354  04/17/17 1651 04/18/17 0435  NA 144  --  147* 146*  K 3.2*  --  3.7 3.6  CL 110  --  107 111  CO2 23  --   --  25  GLUCOSE 126*  --  141* 178*  BUN 18  --  22* 24*  CREATININE 1.26*  < > 1.40*  1.75*  CALCIUM 7.8*  --   --  8.6*  < > = values in this interval not displayed.   PT/INR:   Recent Labs  04/16/17 2244  LABPROT 11.8  INR 0.87    CBG (last 3)   Recent Labs  04/17/17 2023 04/18/17 0017 04/18/17 0426  GLUCAP 160* 157* 176*    ABG    Component Value Date/Time   PHART 7.373 04/18/2017 0505   PCO2ART 40.6 04/18/2017 0505   PO2ART 61.6 (L) 04/18/2017 0505   HCO3 23.3 04/18/2017 0505   TCO2 22 04/17/2017 1912   ACIDBASEDEF 1.3 04/18/2017 0505   O2SAT 91.0 04/18/2017 0505    CXR: PORTABLE CHEST 1 VIEW  COMPARISON:  1 day prior  FINDINGS: Extubation. Removal of right-sided Swan-Ganz catheter with Cordis sheath remaining in place. Dual lead pacer. Right-sided and probable left-sided chest tubes remain in place. Mitral valve replacement. Left atrial appendage occlusion device. Midline trachea. Cardiomegaly accentuated by AP portable technique. Small right pleural effusion. No convincing evidence of pneumothorax. Lucency at the right costophrenic angle is felt to represent aerated lung. Significantly diminished lung volumes. Pulmonary venous congestion. Similar left and increased right base airspace disease.  IMPRESSION: Extubation with diminished lung volumes. Persistent pulmonary venous congestion with worsened right and similar left base airspace disease.  Probable small  bilateral pleural effusions; no pneumothorax.   Electronically Signed   By: Jeronimo Greaves M.D.   On: 04/18/2017 07:54   Assessment/Plan:  Overall stable POD2 although significant post op delirium, history of delirium and encephalopathy on previous hospitalizations Maintaining paced rhythm w/ stable hemodynamics, on NTG for hypertension and very low dose milrinone Oxygenation marginal overnight but currently doing well on BiPAP Expected post op acute blood loss anemia, mild Expected post op atelectasis, mild Chronic diastolic CHF with expected post-op volume excess,  weight reportedly 13 lbs > preop Elevated serum creatinine, likely due to prerenal azotemia +/- acute kidney injury caused by ATN with pre-existing CKD stage III   Wean milrinone off  Keep NPO until mental status improves  Lasix to stimulate diuresis  Mobilize as much as possible  Minimize sedation, narcotics  Purcell Nails, MD 04/18/2017 8:14 AM

## 2017-04-18 NOTE — Progress Notes (Signed)
Notified Dr. Cornelius Moras of periods of shallow breathing and apnea.  O2 sats remain 92-98% on BiPAP.  Attempted to wean precedex this AM but patient became very agitated and combative.  Will continue to monitor.

## 2017-04-19 ENCOUNTER — Inpatient Hospital Stay (HOSPITAL_COMMUNITY): Payer: Medicare Other

## 2017-04-19 DIAGNOSIS — I340 Nonrheumatic mitral (valve) insufficiency: Secondary | ICD-10-CM

## 2017-04-19 DIAGNOSIS — G9340 Encephalopathy, unspecified: Secondary | ICD-10-CM

## 2017-04-19 LAB — BLOOD GAS, ARTERIAL
Acid-Base Excess: 7.4 mmol/L — ABNORMAL HIGH (ref 0.0–2.0)
Bicarbonate: 30.8 mmol/L — ABNORMAL HIGH (ref 20.0–28.0)
Drawn by: 23604
FIO2: 55
O2 SAT: 94.5 %
PATIENT TEMPERATURE: 98.1
PO2 ART: 69.7 mmHg — AB (ref 83.0–108.0)
pCO2 arterial: 37.9 mmHg (ref 32.0–48.0)
pH, Arterial: 7.52 — ABNORMAL HIGH (ref 7.350–7.450)

## 2017-04-19 LAB — TYPE AND SCREEN
ABO/RH(D): A POS
Antibody Screen: NEGATIVE
UNIT DIVISION: 0
UNIT DIVISION: 0
UNIT DIVISION: 0
UNIT DIVISION: 0
Unit division: 0
Unit division: 0
Unit division: 0
Unit division: 0
Unit division: 0
Unit division: 0
Unit division: 0
Unit division: 0
Unit division: 0

## 2017-04-19 LAB — URINALYSIS, ROUTINE W REFLEX MICROSCOPIC
Bilirubin Urine: NEGATIVE
GLUCOSE, UA: NEGATIVE mg/dL
Ketones, ur: NEGATIVE mg/dL
LEUKOCYTES UA: NEGATIVE
NITRITE: NEGATIVE
PROTEIN: NEGATIVE mg/dL
Specific Gravity, Urine: 1.006 (ref 1.005–1.030)
Squamous Epithelial / LPF: NONE SEEN
pH: 5 (ref 5.0–8.0)

## 2017-04-19 LAB — BPAM RBC
BLOOD PRODUCT EXPIRATION DATE: 201810242359
BLOOD PRODUCT EXPIRATION DATE: 201810242359
BLOOD PRODUCT EXPIRATION DATE: 201810262359
BLOOD PRODUCT EXPIRATION DATE: 201810262359
BLOOD PRODUCT EXPIRATION DATE: 201810292359
BLOOD PRODUCT EXPIRATION DATE: 201810302359
Blood Product Expiration Date: 201810242359
Blood Product Expiration Date: 201810242359
Blood Product Expiration Date: 201810262359
Blood Product Expiration Date: 201810262359
Blood Product Expiration Date: 201810292359
Blood Product Expiration Date: 201810292359
Blood Product Expiration Date: 201810302359
ISSUE DATE / TIME: 201810091142
ISSUE DATE / TIME: 201810091829
ISSUE DATE / TIME: 201810091940
ISSUE DATE / TIME: 201810092045
ISSUE DATE / TIME: 201810101123
ISSUE DATE / TIME: 201810101509
ISSUE DATE / TIME: 201810101715
ISSUE DATE / TIME: 201810101821
ISSUE DATE / TIME: 201810111552
ISSUE DATE / TIME: 201810091142
ISSUE DATE / TIME: 201810091940
UNIT TYPE AND RH: 6200
UNIT TYPE AND RH: 6200
UNIT TYPE AND RH: 6200
UNIT TYPE AND RH: 6200
UNIT TYPE AND RH: 6200
UNIT TYPE AND RH: 6200
UNIT TYPE AND RH: 6200
Unit Type and Rh: 6200
Unit Type and Rh: 6200
Unit Type and Rh: 6200
Unit Type and Rh: 6200
Unit Type and Rh: 6200
Unit Type and Rh: 6200

## 2017-04-19 LAB — COMPREHENSIVE METABOLIC PANEL
ALBUMIN: 3.2 g/dL — AB (ref 3.5–5.0)
ALT: 64 U/L — ABNORMAL HIGH (ref 14–54)
AST: 68 U/L — AB (ref 15–41)
Alkaline Phosphatase: 49 U/L (ref 38–126)
Anion gap: 11 (ref 5–15)
BILIRUBIN TOTAL: 7.7 mg/dL — AB (ref 0.3–1.2)
BUN: 34 mg/dL — AB (ref 6–20)
CHLORIDE: 107 mmol/L (ref 101–111)
CO2: 30 mmol/L (ref 22–32)
Calcium: 9.4 mg/dL (ref 8.9–10.3)
Creatinine, Ser: 1.77 mg/dL — ABNORMAL HIGH (ref 0.44–1.00)
GFR calc Af Amer: 31 mL/min — ABNORMAL LOW (ref >60)
GFR calc non Af Amer: 26 mL/min — ABNORMAL LOW (ref >60)
GLUCOSE: 145 mg/dL — AB (ref 65–99)
POTASSIUM: 3.2 mmol/L — AB (ref 3.5–5.1)
Sodium: 148 mmol/L — ABNORMAL HIGH (ref 135–145)
Total Protein: 5.6 g/dL — ABNORMAL LOW (ref 6.5–8.1)

## 2017-04-19 LAB — CBC
HEMATOCRIT: 35.9 % — AB (ref 36.0–46.0)
Hemoglobin: 12 g/dL (ref 12.0–15.0)
MCH: 28.8 pg (ref 26.0–34.0)
MCHC: 33.4 g/dL (ref 30.0–36.0)
MCV: 86.3 fL (ref 78.0–100.0)
PLATELETS: 80 10*3/uL — AB (ref 150–400)
RBC: 4.16 MIL/uL (ref 3.87–5.11)
RDW: 16.5 % — AB (ref 11.5–15.5)
WBC: 20 10*3/uL — AB (ref 4.0–10.5)

## 2017-04-19 LAB — GLUCOSE, CAPILLARY
GLUCOSE-CAPILLARY: 102 mg/dL — AB (ref 65–99)
GLUCOSE-CAPILLARY: 112 mg/dL — AB (ref 65–99)
Glucose-Capillary: 143 mg/dL — ABNORMAL HIGH (ref 65–99)
Glucose-Capillary: 88 mg/dL (ref 65–99)
Glucose-Capillary: 111 mg/dL — ABNORMAL HIGH (ref 65–99)
Glucose-Capillary: 113 mg/dL — ABNORMAL HIGH (ref 65–99)
Glucose-Capillary: 210 mg/dL — ABNORMAL HIGH (ref 65–99)

## 2017-04-19 LAB — TSH: TSH: 1.488 u[IU]/mL (ref 0.350–4.500)

## 2017-04-19 LAB — VITAMIN B12: Vitamin B-12: 1095 pg/mL — ABNORMAL HIGH (ref 180–914)

## 2017-04-19 LAB — AMMONIA: Ammonia: 27 umol/L (ref 9–35)

## 2017-04-19 MED ORDER — POTASSIUM CHLORIDE 10 MEQ/50ML IV SOLN
10.0000 meq | INTRAVENOUS | Status: AC
  Administered 2017-04-19 (×3): 10 meq via INTRAVENOUS
  Filled 2017-04-19 (×3): qty 50

## 2017-04-19 MED ORDER — ENOXAPARIN SODIUM 30 MG/0.3ML ~~LOC~~ SOLN
30.0000 mg | SUBCUTANEOUS | Status: DC
  Administered 2017-04-20 – 2017-04-25 (×6): 30 mg via SUBCUTANEOUS
  Filled 2017-04-19 (×6): qty 0.3

## 2017-04-19 MED ORDER — CHLORHEXIDINE GLUCONATE CLOTH 2 % EX PADS
6.0000 | MEDICATED_PAD | Freq: Every day | CUTANEOUS | Status: DC
  Administered 2017-04-20 – 2017-04-26 (×7): 6 via TOPICAL

## 2017-04-19 MED ORDER — ENOXAPARIN SODIUM 30 MG/0.3ML ~~LOC~~ SOLN: 30.0000 mg | SUBCUTANEOUS | Status: DC

## 2017-04-19 MED ORDER — ZINC TRACE METAL 1 MG/ML IV SOLN
INTRAVENOUS | Status: AC
  Administered 2017-04-19: 18:00:00 via INTRAVENOUS
  Filled 2017-04-19: qty 960

## 2017-04-19 MED ORDER — SODIUM CHLORIDE 0.9% FLUSH: 10.0000 mL | INTRAVENOUS | Status: DC | PRN

## 2017-04-19 MED ORDER — FUROSEMIDE 10 MG/ML IJ SOLN
40.0000 mg | Freq: Three times a day (TID) | INTRAMUSCULAR | Status: DC
  Administered 2017-04-19 – 2017-04-20 (×4): 40 mg via INTRAVENOUS
  Filled 2017-04-19 (×4): qty 4

## 2017-04-19 MED ORDER — SODIUM CHLORIDE 0.9% FLUSH
10.0000 mL | Freq: Two times a day (BID) | INTRAVENOUS | Status: DC
  Administered 2017-04-19 – 2017-04-26 (×10): 10 mL

## 2017-04-19 MED ORDER — HALOPERIDOL LACTATE 5 MG/ML IJ SOLN
1.0000 mg | INTRAMUSCULAR | Status: DC | PRN
  Administered 2017-04-19: 1 mg via INTRAVENOUS
  Filled 2017-04-19: qty 1

## 2017-04-19 NOTE — Progress Notes (Signed)
      [address]       [place] [address]             [phone]      POD # 3 MV repair, maze  BP (!) 162/93 (BP Location: Right Arm)   Pulse 70   Temp (!) 97.5 F (36.4 C) (Oral)   Resp 14   Ht 5' 6.5" (1.689 m)   Wt 151 lb 3.8 oz (68.6 kg)   SpO2 98%   BMI 24.04 kg/m    Intake/Output Summary (Last 24 hours) at 04/19/17 1756 Last data filed at 04/19/17 1400  Gross per 24 hour  Intake           569.64 ml  Output             4115 ml  Net         -3545.36 ml   Diuresing well  Delirium persists  Viviann Spare C. Dorris Fetch, MD [facility] [phone]

## 2017-04-19 NOTE — Progress Notes (Signed)
PHARMACY - ADULT TOTAL PARENTERAL NUTRITION CONSULT NOTE   Pharmacy Consult for TPN Indication: Intolerance to enteral feeds  Patient Measurements: Height: 5' 6.5" (168.9 cm) Weight: 151 lb 3.8 oz (68.6 kg) IBW/kg (Calculated) : 60.45 TPN AdjBW (KG): 64.4 Body mass index is 24.04 kg/m.  Assessment:  76 yo F presents for CVTS consultation with severe symtpomatic mitral valve regurgitation. PMH includes, mitral valve prolapse with regurgitation, HTN, CKD, Afib, HLD, hypothyroidism. Being admitted for elective procedure. She has remained reasonably active and is independent over last several months but has noticed increased SOB. Has been eating normally PTA. Has been on BiPAP most of the time this admit. Patient has become more agitated and combative with impulsive behavior. Pharmacy consulted for TPN as CVTS states will not tolerate feeding tube. This is day #4-5 of NPO status.  GI: TPN for intolerance to enteral feeds. She does not seem to be malnourished but CVTS would like TPN to be started today. Albumin ok at 3.2. PPI Endo: No hx of DM. CBGs mostly controlled (80-170s) On SSI, Synthroid Insulin requirements in the past 24 hours: 6 units Lytes: wnl today exc Na 148, K low at 3.2. Potassium replacement ordered. CoCa 9.7. Renal: SCr elevated at 1.77, CrCl ~52ml/min. UOP good. Net negative 3 L yesterday. BUN up to 34. Pulm:Venturimask with 40% FiO2 Cards: S/p mitral valve repair, maze procedure, and re-exploration of R thoracotomy. Chest tube output down to yesterday Hepatobil: LFTs slightly elevated and Tbili elevated at 7.7 Neuro: On Precedex gtt ID: Afebrile, WBC 20.  Best Practices: Lovenox TPN Access: PICC line ordered TPN start date: 10/12 >>  Nutritional Goals (per RD recommendation on 10/11): KCal: 1600-1800 Protein: 80-95 g Fluid: > 1.6 L/day  Current Nutrition:  NPO  Plan:  Start Clinimix E 5/15 at 4ml/hr Hold 20% lipid emulsion for first 7 days for ICU  patients per ASPEN guidelines (Start date 10/19) Will increase TPN to goal as tolerated Add MVI and hold trace elements Add back Zinc 5mg , selenium , and chromium Continue CVTS SSI and adjust as needed Monitor TPN labs F/U improvement in agitation and trial of TFs  TPN not indicated per ASPEN guidelines at this time, but per discussion with CVTS will start TPN today. Consider trial of TFs if agitation improves  Consider switching Synthroid to IV if not able to tolerate PO?  Enzo Bi, PharmD, BCPS Clinical Pharmacist Pager [phone] 04/19/2017 10:02 AM

## 2017-04-19 NOTE — Progress Notes (Signed)
Peripherally Inserted Central Catheter/Midline Placement  The IV Nurse has discussed with the patient and/or persons authorized to consent for the patient, the purpose of this procedure and the potential benefits and risks involved with this procedure.  The benefits include less needle sticks, lab draws from the catheter, and the patient may be discharged home with the catheter. Risks include, but not limited to, infection, bleeding, blood clot (thrombus formation), and puncture of an artery; nerve damage and irregular heartbeat and possibility to perform a PICC exchange if needed/ordered by physician.  Alternatives to this procedure were also discussed.  Bard Power PICC patient education guide, fact sheet on infection prevention and patient information card has been provided to patient /or left at bedside.    PICC/Midline Placement Documentation  PICC Double Lumen 04/19/17 PICC Right Basilic 40 cm 0 cm (Active)  Indication for Insertion or Continuance of Line Prolonged intravenous therapies 04/19/2017 11:00 AM  Exposed Catheter (cm) 0 cm 04/19/2017 11:00 AM  Dressing Change Due 04/26/17 04/19/2017 11:00 AM   Husband signed consent at bedside    Amanda [name] 04/19/2017, 11:45 AM

## 2017-04-19 NOTE — Progress Notes (Signed)
Pt becoming progressively agitated and combative. Sitter and RN tried reorienting pt but pt continued to grow more restless and aggressive. MD paged again and increased precedex parameters. Will continue to monitor

## 2017-04-19 NOTE — Progress Notes (Signed)
[address]       [name] [address]             [phone]        CARDIOTHORACIC SURGERY PROGRESS NOTE  R3 Days Post-OpS/P Procedure(s) (LRB): MINIMALLY INVASIVE MITRAL VALVE REPAIR (MVR) (Right) MINIMALLY INVASIVE MAZE PROCEDURE (N/A) TRANSESOPHAGEAL ECHOCARDIOGRAM (TEE) (N/A) CLIPPING OF ATRIAL APPENDAGE using AtriCure Pro2 clip 45   R3 Days Post-Op Procedure(s) (LRB): RE-EXPLORATION RIGHT THORACOTOMY FOR BLEEDING (Right)  Subjective: Reportedly combative again last night requiring increased dose Precedex.  Will intermittently follow commands and moving all 4 extremities purposefully, but still w/ extreme impulsive behavior and agitation when Precedex decreased.  Breathing comfortably.  Objective: Vital signs: BP Readings from Last 1 Encounters:  04/19/17 (!) 149/81   Pulse Readings from Last 1 Encounters:  04/19/17 76   Resp Readings from Last 1 Encounters:  04/19/17 (!) 22   Temp Readings from Last 1 Encounters:  04/19/17 (!) 96.4 F (35.8 C) (Axillary)    Hemodynamics:    Physical Exam:  Rhythm:   AV paced  Breath sounds: clear  Heart sounds:  RRR w/out murmur  Incisions:  Dressings dry, intact  Abdomen:  Soft, non-distended  Extremities:  Warm, well-perfused  Chest tubes:  decreasing volume thin serosanguinous output, no air leak    Intake/Output from previous day: 10/11 0701 - 10/12 0700 In: 430.4 [I.V.:180.4; IV Piggyback:250] Out: 3460 [Urine:3100; Chest Tube:360] Intake/Output this shift: Total I/O In: -  Out: 550 [Urine:550]  Lab Results:  CBC: Recent Labs  04/18/17 0435 04/19/17 0416  WBC 24.7* 20.0*  HGB 11.6* 12.0  HCT 34.0* 35.9*  PLT 96* 80*    BMET:  Recent Labs  04/18/17 0435 04/18/17 1830 04/19/17 0416  NA 146*  --  148*  K 3.6 3.0* 3.2*  CL 111  --  107  CO2 25  --  30  GLUCOSE 178*  --  145*  BUN 24*  --  34*  CREATININE 1.75*  --  1.77*  CALCIUM 8.6*  --  9.4     PT/INR:     Recent Labs  04/16/17 2244  LABPROT 11.8  INR 0.87    CBG (last 3)   Recent Labs  04/19/17 0027 04/19/17 0358 04/19/17 0739  GLUCAP 88 143* 111*    ABG    Component Value Date/Time   PHART 7.373 04/18/2017 0505   PCO2ART 40.6 04/18/2017 0505   PO2ART 61.6 (L) 04/18/2017 0505   HCO3 23.3 04/18/2017 0505   TCO2 22 04/17/2017 1912   ACIDBASEDEF 1.3 04/18/2017 0505   O2SAT 91.0 04/18/2017 0505    NUU:VOZDGUYQ CHEST 1 VIEW  COMPARISON:  04/18/2017  FINDINGS: Left pacer remains in place, unchanged. Changes of valve replacement. Right chest tube in place without pneumothorax. Elevation of the right hemidiaphragm with right lower lobe atelectasis or infiltrate, stable. Improving aeration at the left base.  IMPRESSION: Decreasing left lower lobe atelectasis or infiltrate. Continued right basilar atelectasis or infiltrate. No pneumothorax.   Electronically Signed   By: Charlett Nose M.D.   On: 04/19/2017 07:20   Assessment/Plan:  Overall stable POD3 but still w/ severe post op delirium, encephalopathy Maintaining AV paced rhythm w/ stable BP Breathing comfortably w/ O2 sats 93-94% on 50% FiO2 BiPAP Expected post op acute blood loss anemia, mild Expected post op atelectasis, mild, R>L Chronic diastolic CHF with expected post-op volume excess, I/O's negative 3 liters yesterday and weight down 4 lbs but still  10 lbs > preop baseline Slightly elevated serum creatinine, likely due to prerenal azotemia +/- acute kidney injury caused by ATN with pre-existing CKD stage III - creatinine stable last 24 hours 1.7 - sodium up slightly 148 Post op leukocytosis w/out fever, trending down, suspect due to systemic inflammatory response rather than infection Post op thrombocytopenia, platelet count 80k   Will check CT head r/o stroke and ask for Neurology and Pulm/CCM consult to assist w/ management of encephalopathy  Continue Precedex for now and avoid all narcotics and  benzodiazepines  Insert PICC line and d/c old Cordis sleeve  Leave chest tubes until drainage decreases further  Continue lasix but decrease dose - watch I/O's  Try stopping BiPAP and watch oxygenation  Keep NPO until Neuro status improved  Start TNA for nutritional support as there is no way patient would tolerate a feeding tube  Lovenox for DVT prophylaxis and watch platelet count  Purcell Nails, MD 04/19/2017 9:23 AM

## 2017-04-19 NOTE — Progress Notes (Signed)
Nutrition Follow-up  DOCUMENTATION CODES:   Not applicable  INTERVENTION:  TPN per Pharmacy.--->See new estimated nutrition needs below.  Once agitation/mentation improves, recommend diet advancement as appropriate or trial of enteral nutrition via Cortrak NGT with Vital 1.5 formula at goal rate of 45 ml/hr with 30 ml Prostat BID to provide 1820 kcal, 103 grams of protein, 821 ml water.   NUTRITION DIAGNOSIS:   Inadequate oral intake related to inability to eat as evidenced by NPO status; ongoing  GOAL:   Patient will meet greater than or equal to 90% of their needs; not met  MONITOR:   Diet advancement, Weight trends, Labs, I & O's, Skin (TPN tolerance)  REASON FOR ASSESSMENT:   Low Braden    ASSESSMENT:   Pt with PMH of HTN, CKD stage III, heart block s/p pacemater, afib, esophageal dysmotility s/p EGD with esophageal dilatation, severe MV prolapse admitted for MV repair and MAZE procedure on 10/9. Pt extubated 10/10 but remains on BiPAP.   Pt currently on Ventri mask. Plans for TPN today. Pt with extreme agitation, violence, confusion from medication sensitivity. Per RN, even with mitts/restraints on pt will pull out feeding tube if placed. Plans to avoid sedation medication as it will not aid in resoling pt confusion. Daughter at bedside reports she has been agitated and confused since returning upon surgery 10/9. Pt NPO since admission 10/9. TPN not indicated per ASPEN guidelines at this time, but per CVTS start TPN today. Once agitation/mentation improves, recommend diet advancement as appropriate or trial of enteral nutrition. Per MD note, if pt get intubated, plans to place Cortrak NGT. Per Pharmacy note, plans to start Clinimix E 5/15 at 34m/hr.  Labs and medications reviewed. Sodium elevated at 148.   Diet Order:  Diet NPO time specified .TPN (CLINIMIX-E) Adult  Skin:   (Incisions)  Last BM:  PTA  Height:   Ht Readings from Last 1 Encounters:  04/16/17 5' 6.5"  (1.689 m)    Weight:   Wt Readings from Last 1 Encounters:  04/19/17 151 lb 3.8 oz (68.6 kg)    Ideal Body Weight:  60.2 kg  BMI:  Body mass index is 24.04 kg/m.  Estimated Nutritional Needs:   Kcal:  1750-1900  Protein:  80-95 grams  Fluid:  Per MD  EDUCATION NEEDS:   No education needs identified at this time  SCorrin Parker MS, RD, LDN Pager # [phone] hours/ weekend pager # [phone]

## 2017-04-19 NOTE — Consult Note (Signed)
PULMONARY / CRITICAL CARE MEDICINE   Name: Amanda [name] MRN: [id number] DOB: [birth year]    ADMISSION DATE:  04/16/2017 CONSULTATION DATE:  10/12  REFERRING MD:  Cornelius Moras  CHIEF COMPLAINT:  Confusion and hypoxia  HISTORY OF PRESENT ILLNESS:   76 yo female with an extensive past medical history that is well documented below. She underwent MAZE and Minimally Invasive Mitral Valve repair with mini thoracotomy on 10/9. She has known chronic renal disease, esophogeal  stricture , BBB ,PPM  And following surgery developed hypoxia requiring NIMVS and FIO2 80%. Further complicated by agitation and confusion. She was started on precedex and PCCM asked to evaluate and assist in her care. Note CT head in 2016 small vessel changes compatible with ischemic small vessel. Note she has been on premarin as a outpatient medication. CTA chest 04/08/17 and reviewed.  PAST MEDICAL HISTORY :  She  has a past medical history of Allergic rhinitis; Anemia; CKD (chronic kidney disease), stage III (HCC); DJD (degenerative joint disease); Glomerulonephritis; Hyperlipidemia; Hyperparathyroidism (HCC); Hypertension; Hypothyroidism; IBS (irritable bowel syndrome); Interstitial cystitis; Mitral regurgitation; Mobitz II; MVP (mitral valve prolapse); PAC (premature atrial contraction); PAF (paroxysmal atrial fibrillation) (HCC); Paroxysmal atrial fibrillation (HCC); PVC's (premature ventricular contractions); S/P minimally invasive maze operation for atrial fibrillation (04/16/2017); S/P minimally invasive mitral valve repair (04/16/2017); S/P placement of cardiac pacemaker; and Small vessel disease, cerebrovascular.  PAST SURGICAL HISTORY: She  has a past surgical history that includes Abdominal hysterectomy; Breast biopsy (Right); Tonsillectomy; Cardiac catheterization (N/A, 01/07/2015); Cardiac catheterization (N/A, 02/03/2015); TEE without cardioversion (N/A, 03/18/2017); RIGHT/LEFT HEART CATH AND CORONARY ANGIOGRAPHY (N/A,  03/26/2017); Cystostomy w/ bladder biopsy; Mitral valve repair (Right, 04/16/2017); Minimally invasive maze procedure (N/A, 04/16/2017); TEE without cardioversion (N/A, 04/16/2017); Clipping of atrial appendage (04/16/2017); and Mitral valve repair (Right, 04/16/2017).  Allergies  Allergen Reactions  . Pepcid [Famotidine] Swelling and Other (See Comments)    "Throat Swelling"   . Allopurinol Other (See Comments)    Caused headaches  . Amlodipine Other (See Comments)    PEDAL EDEMA   . Colchicine Other (See Comments)    UNSPECIFIED REACTION   . Contrast Media [Iodinated Diagnostic Agents] Hives    IVP dye per patient  . Prilosec [Omeprazole] Nausea Only  . Atorvastatin Other (See Comments)    UNSPECIFIED REACTION   . Cephalexin Itching and Rash  . Ciprofloxacin Itching and Rash  . Erythromycin Itching and Rash  . Sulfonamide Derivatives Itching and Rash  . Tetracycline Itching and Rash    No current facility-administered medications on file prior to encounter.    Current Outpatient Prescriptions on File Prior to Encounter  Medication Sig  . apixaban (ELIQUIS) 5 MG TABS tablet Take 1 tablet (5 mg total) by mouth 2 (two) times daily. (Patient not taking: Reported on 04/15/2017)  . atenolol (TENORMIN) 50 MG tablet Take 1 tablet (50 mg total) by mouth daily. (Patient taking differently: Take 50 mg by mouth 2 (two) times daily. )  . conjugated estrogens (PREMARIN) vaginal cream Place 1 Applicatorful vaginally every 3 (three) days.   . fluticasone (FLONASE) 50 MCG/ACT nasal spray Place 2 sprays into both nostrils daily.  . furosemide (LASIX) 20 MG tablet Take 1 tablet (20 mg total) by mouth daily.  [name] levocetirizine (XYZAL) 5 MG tablet Take 5 mg by mouth every evening.  [name] levothyroxine (SYNTHROID, LEVOTHROID) 75 MCG tablet Take 75 mcg by mouth See admin instructions. Take 75 mcg by mouth daily around 0300  . naproxen sodium (ANAPROX) 220 MG  tablet Take 220 mg by mouth 2 (two) times daily as  needed (for pain).   . NON FORMULARY 1 each by Other route See admin instructions. Allergy injections once weekly  . Probiotic Product (PROBIOTIC DAILY PO) Take 1 tablet by mouth daily.  . hydrocortisone valerate ointment (WEST-CORT) 0.2 % Apply 1 application topically 2 (two) times daily as needed (for itching).     FAMILY HISTORY:  Her indicated that her mother is deceased. She indicated that her father is deceased. She indicated that one of her six brothers is deceased. She indicated that the status of her neg hx is unknown.    SOCIAL HISTORY: She  reports that she has quit smoking. She has never used smokeless tobacco. She reports that she does not drink alcohol or use drugs.  REVIEW OF SYSTEMS:   NA due to decreased loc BUT CHART REVIEWED  SUBJECTIVE:  Currently on precedex, not agitated.  VITAL SIGNS: BP (!) 155/83 (BP Location: Right Arm)   Pulse 70   Temp (!) 96.4 F (35.8 C) (Axillary)   Resp 13   Ht 5' 6.5" (1.689 m)   Wt 151 lb 3.8 oz (68.6 kg)   SpO2 96%   BMI 24.04 kg/m   HEMODYNAMICS:    VENTILATOR SETTINGS: Vent Mode: BIPAP FiO2 (%):  [40 %-60 %] 40 % Set Rate:  [15 bmp] 15 bmp PEEP:  [5 cmH20] 5 cmH20 Pressure Support:  [5 cmH20] 5 cmH20  INTAKE / OUTPUT: I/O last 3 completed shifts: In: 802.2 [I.V.:352.2; IV Piggyback:450] Out: 5200 [Urine:4290; Chest Tube:910]  PHYSICAL EXAMINATION: General:  Frail elderly female  Confused but off nimvs HEENT: Venti mask in place., no jvd noted PSY: sedated or agitated  Neuro: mae x 4 CV: HSR Paced PULM: decreased air movement , rt ct in place no air leak ZO:XWRU, non-tender, bsx4 active  Extremities: warm/dry,+ edema  Skin: no rashes or lesions   LABS:  BMET  Recent Labs Lab 04/17/17 0354  04/17/17 1651 04/18/17 0435 04/18/17 1830 04/19/17 0416  NA 144  --  147* 146*  --  148*  K 3.2*  --  3.7 3.6 3.0* 3.2*  CL 110  --  107 111  --  107  CO2 23  --   --  25  --  30  BUN 18  --  22* 24*  --   34*  CREATININE 1.26*  < > 1.40* 1.75*  --  1.77*  GLUCOSE 126*  --  141* 178*  --  145*  < > = values in this interval not displayed.  Electrolytes  Recent Labs Lab 04/17/17 0354 04/17/17 1630 04/18/17 0435 04/19/17 0416  CALCIUM 7.8*  --  8.6* 9.4  MG 2.5* 2.2  --   --     CBC  Recent Labs Lab 04/17/17 1630 04/17/17 1651 04/18/17 0435 04/19/17 0416  WBC 16.2*  --  24.7* 20.0*  HGB 11.1* 11.2* 11.6* 12.0  HCT 32.7* 33.0* 34.0* 35.9*  PLT 94*  --  96* 80*    Coag's  Recent Labs Lab 04/16/17 1523 04/16/17 1858 04/16/17 2244  APTT 46* 46* 35  INR 1.78 1.66 0.87    Sepsis Markers No results for input(s): LATICACIDVEN, PROCALCITON, O2SATVEN in the last 168 hours.  ABG  Recent Labs Lab 04/17/17 1910 04/17/17 1912 04/18/17 0505  PHART 7.340* 7.334* 7.373  PCO2ART 40.5 38.1 40.6  PO2ART 59.0* 74.0* 61.6*    Liver Enzymes  Recent Labs Lab 04/12/17 1519 04/19/17 0416  AST 19 68*  ALT 18 64*  ALKPHOS 57 49  BILITOT 0.8 7.7*  ALBUMIN 3.7 3.2*    Cardiac Enzymes No results for input(s): TROPONINI, PROBNP in the last 168 hours.  Glucose  Recent Labs Lab 04/18/17 0828 04/18/17 1145 04/18/17 1509 04/19/17 0027 04/19/17 0358 04/19/17 0739  GLUCAP 148* 122* 123* 88 143* 111*    Imaging Dg Chest Port 1 View  Result Date: 04/19/2017 CLINICAL DATA:  Atelectasis, chest tube EXAM: PORTABLE CHEST 1 VIEW COMPARISON:  04/18/2017 FINDINGS: Left pacer remains in place, unchanged. Changes of valve replacement. Right chest tube in place without pneumothorax. Elevation of the right hemidiaphragm with right lower lobe atelectasis or infiltrate, stable. Improving aeration at the left base. IMPRESSION: Decreasing left lower lobe atelectasis or infiltrate. Continued right basilar atelectasis or infiltrate. No pneumothorax. Electronically Signed   By: Charlett Nose M.D.   On: 04/19/2017 07:20     STUDIES:    CULTURES: 10/12 uc>>  ANTIBIOTICS: 10/9  zinacef>>  SIGNIFICANT EVENTS: 10/9 Maze and valve repair  LINES/TUBES: 10/9 chest tube>>  DISCUSSION: 76 yo female with an extensive pmh who underwent maze and minimal invasive mitral valve repair 10/9. Subsequently she has been hypoxic and confused. Due to her extensive and complicated history coupled with advanced age PCCM asked to help with sedation and pulmonary issues.  ASSESSMENT / PLAN:  PULMONARY A: Former smoker Increased fio2 needs Required NIMVS, will use prn May need intubation in future P:   O2 as needed  NIMVS as needed Avoid oversedation Pulmonary toilet as tolerated  CARDIOVASCULAR A:  Post Mitral valve repair 10/9 Mini thoracotomy 10/9 Hx of BBB PPM HTN  P:  Per CVTS  RENAL Lab Results  Component Value Date   CREATININE 1.77 (H) 04/19/2017   CREATININE 1.75 (H) 04/18/2017   CREATININE 1.40 (H) 04/17/2017   CREATININE 1.43 (H) 03/16/2016    Recent Labs Lab 04/18/17 0435 04/18/17 1830 04/19/17 0416  K 3.6 3.0* 3.2*     A:   Chromic kidney disease Hypokalemia P:   Avoid nephrotoxins Replete lytes  GASTROINTESTINAL A:   NPO Plan for TNA Hx of esophogeal dysmotility and stricture  P:   NPO TNA per CVTS If intubated would place Cor trak PPI  HEMATOLOGIC  Recent Labs  04/18/17 0435 04/19/17 0416  HGB 11.6* 12.0    A:   No acute issues post op P:  Monitor h/h DVT protection   INFECTIOUS A:   Hx of frequent UTI P:   On Zinacef post operative day 3 Follow culture data  ENDOCRINE CBG (last 3)   Recent Labs  04/19/17 0027 04/19/17 0358 04/19/17 0739  GLUCAP 88 143* 111*      A:   Hypothyroidism Hyperparathyroidism  P:   TSH 1.5  NEUROLOGIC A:   Confusion and delirium post maze and mitral valve repair 10/9 Currently on low dose precedex. Concern for oversedation that has negative  impact resp status Hx of cerebral vascular ischemic changes 2016 head ct. P:   RASS goal: o Judicious use of  precedex May benefit from PRN haldol during crisis agitation  episodes Would avoid benzos in elderly She may require intubation and sedation in future if refractory to current interventions.   FAMILY  - Updates: 10/12 daughter updated extensively per S. Norina Cowper NP  - Inter-disciplinary family meet or Palliative Care meeting due by:  day 7    Steve Iverson Sees ACNP Adolph Pollack PCCM Pager [phone] till 3 pm If no answer  page [phone] 04/19/2017, 10:49 AM

## 2017-04-19 NOTE — Care Management Note (Signed)
Case Management Note  Patient Details  Name: MOANI RAEL MRN: [id number] Date of Birth: [birth year]  Subjective/Objective:      Pt is s/p MAZE - required precedex post for delirum              Action/Plan:   Pt has sitter, on venti mask.  No family at bedside.  CM will continue to follow for discharge needs   Expected Discharge Date:                  Expected Discharge Plan:     In-House Referral:     Discharge planning Services     Post Acute Care Choice:    Choice offered to:     DME Arranged:    DME Agency:     HH Arranged:    HH Agency:     Status of Service:     If discussed at [employer] of Stay Meetings, dates discussed:    Additional Comments:  Cherylann Parr, RN 04/19/2017, 2:46 PM

## 2017-04-20 DIAGNOSIS — Z9889 Other specified postprocedural states: Secondary | ICD-10-CM

## 2017-04-20 LAB — BASIC METABOLIC PANEL
ANION GAP: 12 (ref 5–15)
ANION GAP: 11 (ref 5–15)
BUN: 51 mg/dL — ABNORMAL HIGH (ref 6–20)
BUN: 59 mg/dL — ABNORMAL HIGH (ref 6–20)
CALCIUM: 9.4 mg/dL (ref 8.9–10.3)
CO2: 35 mmol/L — ABNORMAL HIGH (ref 22–32)
CO2: 37 mmol/L — ABNORMAL HIGH (ref 22–32)
Calcium: 9.5 mg/dL (ref 8.9–10.3)
Chloride: 103 mmol/L (ref 101–111)
Chloride: 99 mmol/L — ABNORMAL LOW (ref 101–111)
Creatinine, Ser: 1.64 mg/dL — ABNORMAL HIGH (ref 0.44–1.00)
Creatinine, Ser: 1.76 mg/dL — ABNORMAL HIGH (ref 0.44–1.00)
GFR, EST AFRICAN AMERICAN: 33 mL/min — AB (ref >60)
GFR, EST AFRICAN AMERICAN: 31 mL/min — AB (ref >60)
GFR, EST NON AFRICAN AMERICAN: 29 mL/min — AB (ref >60)
GFR, EST NON AFRICAN AMERICAN: 27 mL/min — AB (ref >60)
GLUCOSE: 190 mg/dL — AB (ref 65–99)
Glucose, Bld: 151 mg/dL — ABNORMAL HIGH (ref 65–99)
POTASSIUM: 2.3 mmol/L — AB (ref 3.5–5.1)
Potassium: 2.5 mmol/L — CL (ref 3.5–5.1)
SODIUM: 147 mmol/L — AB (ref 135–145)
Sodium: 150 mmol/L — ABNORMAL HIGH (ref 135–145)

## 2017-04-20 LAB — GLUCOSE, CAPILLARY
GLUCOSE-CAPILLARY: 144 mg/dL — AB (ref 65–99)
GLUCOSE-CAPILLARY: 187 mg/dL — AB (ref 65–99)
GLUCOSE-CAPILLARY: 194 mg/dL — AB (ref 65–99)
GLUCOSE-CAPILLARY: 219 mg/dL — AB (ref 65–99)
Glucose-Capillary: 137 mg/dL — ABNORMAL HIGH (ref 65–99)
Glucose-Capillary: 143 mg/dL — ABNORMAL HIGH (ref 65–99)
Glucose-Capillary: 224 mg/dL — ABNORMAL HIGH (ref 65–99)

## 2017-04-20 LAB — COMPREHENSIVE METABOLIC PANEL
ALT: 102 U/L — ABNORMAL HIGH (ref 14–54)
AST: 100 U/L — AB (ref 15–41)
Albumin: 3.2 g/dL — ABNORMAL LOW (ref 3.5–5.0)
Alkaline Phosphatase: 65 U/L (ref 38–126)
Anion gap: 12 (ref 5–15)
BUN: 49 mg/dL — AB (ref 6–20)
CO2: 35 mmol/L — ABNORMAL HIGH (ref 22–32)
Calcium: 9.8 mg/dL (ref 8.9–10.3)
Chloride: 98 mmol/L — ABNORMAL LOW (ref 101–111)
Creatinine, Ser: 1.68 mg/dL — ABNORMAL HIGH (ref 0.44–1.00)
GFR, EST AFRICAN AMERICAN: 33 mL/min — AB (ref >60)
GFR, EST NON AFRICAN AMERICAN: 28 mL/min — AB (ref >60)
Glucose, Bld: 595 mg/dL (ref 65–99)
POTASSIUM: 3.7 mmol/L (ref 3.5–5.1)
Sodium: 145 mmol/L (ref 135–145)
TOTAL PROTEIN: 6.1 g/dL — AB (ref 6.5–8.1)
Total Bilirubin: 9.4 mg/dL — ABNORMAL HIGH (ref 0.3–1.2)

## 2017-04-20 LAB — CBC
HCT: 39.6 % (ref 36.0–46.0)
Hemoglobin: 12.7 g/dL (ref 12.0–15.0)
MCH: 29.2 pg (ref 26.0–34.0)
MCHC: 32.1 g/dL (ref 30.0–36.0)
MCV: 91 fL (ref 78.0–100.0)
PLATELETS: 95 10*3/uL — AB (ref 150–400)
RBC: 4.35 MIL/uL (ref 3.87–5.11)
RDW: 16.3 % — ABNORMAL HIGH (ref 11.5–15.5)
WBC: 21.6 10*3/uL — AB (ref 4.0–10.5)

## 2017-04-20 LAB — PREALBUMIN: PREALBUMIN: 15.1 mg/dL — AB (ref 18–38)

## 2017-04-20 LAB — PHOSPHORUS: Phosphorus: 4.8 mg/dL — ABNORMAL HIGH (ref 2.5–4.6)

## 2017-04-20 LAB — TRIGLYCERIDES: Triglycerides: 138 mg/dL (ref <150)

## 2017-04-20 LAB — MAGNESIUM: MAGNESIUM: 2.2 mg/dL (ref 1.7–2.4)

## 2017-04-20 LAB — RPR: RPR Ser Ql: NONREACTIVE

## 2017-04-20 MED ORDER — ZINC TRACE METAL 1 MG/ML IV SOLN
INTRAVENOUS | Status: AC
  Administered 2017-04-20: 18:00:00 via INTRAVENOUS
  Filled 2017-04-20: qty 960

## 2017-04-20 MED ORDER — CLONIDINE HCL 0.2 MG/24HR TD PTWK
0.2000 mg | MEDICATED_PATCH | TRANSDERMAL | Status: DC
  Administered 2017-04-20: 0.2 mg via TRANSDERMAL
  Filled 2017-04-20: qty 1

## 2017-04-20 MED ORDER — RESOURCE THICKENUP CLEAR PO POWD
ORAL | Status: DC | PRN
  Filled 2017-04-20: qty 125

## 2017-04-20 MED ORDER — POTASSIUM CHLORIDE 10 MEQ/50ML IV SOLN
10.0000 meq | INTRAVENOUS | Status: AC
  Administered 2017-04-20 (×6): 10 meq via INTRAVENOUS
  Filled 2017-04-20 (×3): qty 50

## 2017-04-20 MED ORDER — DEXTROSE 5 % IV SOLN
INTRAVENOUS | Status: DC
  Administered 2017-04-20: 12:00:00 via INTRAVENOUS

## 2017-04-20 MED ORDER — POTASSIUM CHLORIDE 10 MEQ/50ML IV SOLN
10.0000 meq | INTRAVENOUS | Status: AC
  Administered 2017-04-20 (×3): 10 meq via INTRAVENOUS
  Filled 2017-04-20 (×3): qty 50

## 2017-04-20 MED ORDER — HYDRALAZINE HCL 20 MG/ML IJ SOLN: 10.0000 mg | Freq: Four times a day (QID) | INTRAMUSCULAR | Status: DC | PRN

## 2017-04-20 MED ORDER — POTASSIUM CHLORIDE 10 MEQ/50ML IV SOLN: 10.0000 meq | INTRAVENOUS | Status: DC

## 2017-04-20 MED ORDER — POTASSIUM CHLORIDE 10 MEQ/50ML IV SOLN
10.0000 meq | INTRAVENOUS | Status: AC
  Administered 2017-04-20 (×4): 10 meq via INTRAVENOUS
  Filled 2017-04-20 (×4): qty 50

## 2017-04-20 NOTE — Progress Notes (Signed)
PHARMACY - ADULT TOTAL PARENTERAL NUTRITION CONSULT NOTE   Pharmacy Consult for TPN Indication: Intolerance to enteral feeds  Patient Measurements: Height: 5' 6.5" (168.9 cm) Weight: 136 lb 14.5 oz (62.1 kg) IBW/kg (Calculated) : 60.45 TPN AdjBW (KG): 64.4 Body mass index is 21.77 kg/m.  Assessment:  76 yo F presents for CVTS consultation with severe symtpomatic mitral valve regurgitation. PMH includes, mitral valve prolapse with regurgitation, HTN, CKD, Afib, HLD, hypothyroidism. Being admitted for elective procedure. She has remained reasonably active and is independent over last several months but has noticed increased SOB. Has been eating normally PTA. Has been on BiPAP most of the time this admit. Patient has become more agitated and combative with impulsive behavior. Pharmacy consulted for TPN as CVTS states will not tolerate feeding tube. This is day #4-5 of NPO status.  GI: TPN for intolerance to enteral feeds. She does not seem to be malnourished but CVTS would like TPN to be started today. Albumin ok at 3.2. Prealbumin low at 15.1. PPI Endo: No hx of DM. CBGs mostly controlled but trending up after TPN started (140-210s) Had probable lab error on Bmet with CBG of 595 but stick was fine near that time. On SSI, Synthroid Insulin requirements in the past 24 hours: 18 units Lytes: wnl today exc Na 148, K dropped to 2.3 today. Potassium replacement ordered. CoCa 9.8. Phos elevated at 4.8, Mg ok at 2.2 Renal: SCr elevated at 1.64, CrCl ~20ml/min. UOP good. Net negative another 3 L yesterday. BUN up to 51 Pulm:Venturimask with 40% FiO2 Cards: S/p mitral valve repair, maze procedure, and re-exploration of R thoracotomy. Chest tube output stable at yesterday Hepatobil: LFTs elevated and trending up and Tbili up to 9.4 Neuro: On Precedex gtt ID: Afebrile, WBC 21.6  Best Practices: Lovenox TPN Access: PICC double lumen >> TPN start date: 10/12 >>  Nutritional Goals (per RD  recommendation on 10/12): KCal: 1750-1900 Protein: 80-95 g Fluid: > 1.6 L/day  Current Nutrition:  NPO  Plan:  Continue Clinimix E 5/15 at 29ml/hr Hold 20% lipid emulsion for first 7 days for ICU patients per ASPEN guidelines (Start date 10/19) Will increase TPN to goal as tolerated Add MVI and hold trace elements Add back Zinc 5mg , selenium , and chromium Continue CVTS SSI and adjust as needed, will probably need to add insulin to bag tomorrow Monitor TPN labs, Renal function panel tomorrow, BUN / LFTs closely (acute increase may be causing some of her delirium)  May need to consider lower protein goals in acute hepatic failure F/U improvement in agitation and trial of TFs  Give EXTRA KCl runs x 6 on top of 3 runs provided by post-op protocol  Consider cortrak placement and trial of TFs if agitation improves  Consider switching Synthroid to IV if not able to tolerate PO?  Enzo Bi, PharmD, BCPS Clinical Pharmacist Pager [phone] 04/20/2017 7:18 AM

## 2017-04-20 NOTE — Progress Notes (Signed)
4 Days Post-Op Procedure(s) (LRB): RE-EXPLORATION RIGHT THORACOTOMY FOR BLEEDING (Right) Subjective: Up in chair More alert, not agitated  Objective: Vital signs in last 24 hours: Temp:  [97.5 F (36.4 C)-98.3 F (36.8 C)] 97.5 F (36.4 C) (10/13 0858) Pulse Rate:  [68-71] 69 (10/13 1000) Cardiac Rhythm: A-V Sequential paced (10/13 0800) Resp:  [11-31] 18 (10/13 1000) BP: (113-189)/(69-106) 152/80 (10/13 0900) SpO2:  [88 %-98 %] 98 % (10/13 1000) FiO2 (%):  [40 %-55 %] 55 % (10/13 0800) Weight:  [136 lb 14.5 oz (62.1 kg)] 136 lb 14.5 oz (62.1 kg) (10/13 0400)  Hemodynamic parameters for last 24 hours:    Intake/Output from previous day: 10/12 0701 - 10/13 0700 In: 881.2 [I.V.:581.2; IV Piggyback:300] Out: 4415 [Urine:4025; Chest Tube:390] Intake/Output this shift: Total I/O In: 240.7 [I.V.:140.7; IV Piggyback:100] Out: 550 [Urine:550]  General appearance: alert, cooperative and no distress Neurologic: no focal deficit Heart: regular rate and rhythm Lungs: diminished breath sounds bibasilar Abdomen: normal findings: soft, non-tender  Lab Results:  Recent Labs  04/19/17 0416 04/20/17 0319  WBC 20.0* 21.6*  HGB 12.0 12.7  HCT 35.9* 39.6  PLT 80* 95*   BMET:  Recent Labs  04/20/17 0319 04/20/17 0614  NA 145 150*  K 3.7 2.3*  CL 98* 103  CO2 35* 35*  GLUCOSE 595* 151*  BUN 49* 51*  CREATININE 1.68* 1.64*  CALCIUM 9.8 9.5    PT/INR: No results for input(s): LABPROT, INR in the last 72 hours. ABG    Component Value Date/Time   PHART 7.520 (H) 04/19/2017 2132   HCO3 30.8 (H) 04/19/2017 2132   TCO2 22 04/17/2017 1912   ACIDBASEDEF 1.3 04/18/2017 0505   O2SAT 94.5 04/19/2017 2132   CBG (last 3)   Recent Labs  04/20/17 0409 04/20/17 0516 04/20/17 0836  GLUCAP 137* 144* 187*    Assessment/Plan: S/P Procedure(s) (LRB): RE-EXPLORATION RIGHT THORACOTOMY FOR BLEEDING (Right) CV- hypertensive, not able to take meds PO yet  Will start catapres  patch  PRN hydralazine  Start coumadin when able to take PO  NEURO- delirium much improved  Oriented to person and place, no focal motor deficit  RESP_ IS  RENAL- creatinine better- severe hypokalemia- supplement and recheck K  ENDO- CBG elevated  Nutrition- on TNA   LOS: 4 days    Loreli Slot 04/20/2017

## 2017-04-20 NOTE — Evaluation (Signed)
Clinical/Bedside Swallow Evaluation Patient Details  Name: Amanda [name] MRN: [id number] Date of Birth: Sep 26, 1940  Today's Date: 04/20/2017 Time: SLP Start Time (ACUTE ONLY): 1515 SLP Stop Time (ACUTE ONLY): 1540 SLP Time Calculation (min) (ACUTE ONLY): 25 min  Past Medical History:  Past Medical History:  Diagnosis Date  . Allergic rhinitis   . Anemia   . CKD (chronic kidney disease), stage III (HCC)    stage III  . DJD (degenerative joint disease)   . Glomerulonephritis    Dr [name]  . Hyperlipidemia   . Hyperparathyroidism (HCC)   . Hypertension   . Hypothyroidism   . IBS (irritable bowel syndrome)   . Interstitial cystitis   . Mitral regurgitation   . Mobitz II    a. s/p STJ dual chamber PPM   . MVP (mitral valve prolapse)    moderate posterior MVP with moderate MR and grade II diasotlic dysfunction  . PAC (premature atrial contraction)   . PAF (paroxysmal atrial fibrillation) (HCC)     note on pacer check. CHADS2VASC score is 4 now on Eliquis.  . Paroxysmal atrial fibrillation (HCC)    CHADS2VASC score is 4  . PVC's (premature ventricular contractions)   . S/P minimally invasive maze operation for atrial fibrillation 04/16/2017   Complete bilateral atrial lesion set using cryothermy and bipolar radiofrequency ablation with clipping of LA appendage via right mini thoracotomy approach  . S/P minimally invasive mitral valve repair 04/16/2017   Complex valvuloplasty including triangular resection of posterior leaflet, artificial Gore-tex neochords x6 and Sorin Memo 3D ring annuloplasty ([id number], size 32, serial # [id number])  . S/P placement of cardiac pacemaker   . Small vessel disease, cerebrovascular    Past Surgical History:  Past Surgical History:  Procedure Laterality Date  . ABDOMINAL HYSTERECTOMY    . BREAST BIOPSY Right   . CLIPPING OF ATRIAL APPENDAGE  04/16/2017   Procedure: CLIPPING OF ATRIAL APPENDAGE using AtriCure Pro2 clip 45;  Surgeon: Purcell Nails, MD;  Location: [facility] OR;  Service: Open Heart Surgery;;  . CYSTOSTOMY W/ BLADDER BIOPSY    . EP IMPLANTABLE DEVICE N/A 01/07/2015   STJ dual chamber pacemaker implanted by Dr [name] for 2:1 heart block  . MINIMALLY INVASIVE MAZE PROCEDURE N/A 04/16/2017   Procedure: MINIMALLY INVASIVE MAZE PROCEDURE;  Surgeon: Purcell Nails, MD;  Location: [facility] OR;  Service: Open Heart Surgery;  Laterality: N/A;  . MITRAL VALVE REPAIR Right 04/16/2017   Procedure: MINIMALLY INVASIVE MITRAL VALVE REPAIR (MVR);  Surgeon: Purcell Nails, MD;  Location: [facility] OR;  Service: Open Heart Surgery;  Laterality: Right;  . MITRAL VALVE REPAIR Right 04/16/2017   Procedure: RE-EXPLORATION RIGHT THORACOTOMY FOR BLEEDING;  Surgeon: Purcell Nails, MD;  Location: [facility] OR;  Service: Open Heart Surgery;  Laterality: Right;  . PERIPHERAL VASCULAR CATHETERIZATION N/A 02/03/2015   Procedure: Upper Extremity Venography;  Surgeon: [name], MD;  Location: [facility] INVASIVE CV LAB;  Service: Cardiovascular;  Laterality: N/A;  . RIGHT/LEFT HEART CATH AND CORONARY ANGIOGRAPHY N/A 03/26/2017   Procedure: RIGHT/LEFT HEART CATH AND CORONARY ANGIOGRAPHY;  Surgeon: [name], MD;  Location: [facility] INVASIVE CV LAB;  Service: Cardiovascular;  Laterality: N/A;  . TEE WITHOUT CARDIOVERSION N/A 03/18/2017   Procedure: TRANSESOPHAGEAL ECHOCARDIOGRAM (TEE);  Surgeon: [name], MD;  Location: [facility] ENDOSCOPY;  Service: Cardiovascular;  Laterality: N/A;  . TEE WITHOUT CARDIOVERSION N/A 04/16/2017   Procedure: TRANSESOPHAGEAL ECHOCARDIOGRAM (TEE);  Surgeon: Purcell Nails, MD;  Location: [facility] OR;  Service: Open Heart Surgery;  Laterality: N/A;  . TONSILLECTOMY     HPI:  76 yo female with an extensive pmh including esophageal dysmotility and stricture, s/p balloon dilation (02/19/17), who underwent maze and minimal invasive mitral valve repair 10/9. Subsequently encephalopathic, hypoxic requiring bipap. 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 Referred for swallowing evaluation.   Assessment / Plan / Recommendation Clinical Impression  Pt presents with moderate risk for aspiration given recent intubation for procedure (extubated 10/10), subsequent decreased respiratory status and encephalopathy, as well as history of esophageal stricture/dysmotility. Post-extubation, pt required bipap and with confusion/agitation, now on [place] and tolerating well with improving mentation, per RN. Pt alert, pleasant, oriented x4, following all commands. Cough is weak, pt with low vocal intensity. With thin liquids, pt demonstrates intermittent throat clearing, suggestive of reduced airway protection. Suspect delayed swallow initiation. No overt signs of aspiration observed with puree, nectar thick liquids. With regular solids, pt requires extended time for mastication, moderate diffuse oral residue noted and subsequent cough, again suggestive of reduced airway protection. Suspect difficulty with solids primarily attributable to xerostomia. Recommend initiating conservative diet of dys 2 (fine chop) with nectar thick liquids, meds whole in puree given pt's risk factors for aspiration. SLP will follow closely for tolerance, advancement at bedside or following instrumental assessment if warranted.  SLP Visit Diagnosis: Dysphagia, unspecified (R13.10)    Aspiration Risk  Moderate aspiration risk    Diet Recommendation Dysphagia 2 (Fine chop);Nectar-thick liquid   Liquid Administration via: Cup;Straw Medication Administration: Whole meds with puree Supervision: Patient able to self feed;Staff to assist with self feeding Compensations: Slow rate;Small sips/bites;Minimize environmental distractions Postural Changes: Seated upright at 90 degrees;Remain upright for at least 30 minutes after po intake    Other  Recommendations Oral Care Recommendations: Oral care BID Other  Recommendations: Order thickener from pharmacy   Follow up Recommendations None      Frequency and Duration min 2x/week  1 week       Prognosis Prognosis for Safe Diet Advancement: Good      Swallow Study   General Date of Onset: 04/16/17 HPI: 76 yo female with an extensive pmh including esophageal dysmotility and stricture, s/p balloon dilation (02/19/17), who underwent maze and minimal invasive mitral valve repair 10/9. Subsequently encephalopathic, hypoxic requiring bipap. 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 Referred for swallowing evaluation. Type of Study: Bedside Swallow Evaluation Previous Swallow Assessment: Barium swallow 01/16/17 revealed nonspecific esophageal dysmotility, narrowing of the distal esophagus, lower esophageal mucosal ring, small hiatal hernia and inducible GE reflux.; EGD 02/19/17: 2 cm hiatal hernia, benign-appearing esophageal stenosis. Dilated to 18mm with good result. Diet Prior to this Study: NPO Temperature Spikes Noted: No Respiratory Status: Nasal cannula History of Recent Intubation: Yes Length of Intubations (days): 2 days Date extubated: 04/17/17 Behavior/Cognition: Alert;Cooperative;Pleasant mood Oral Cavity Assessment: Within Functional Limits Oral Care Completed by SLP: Recent completion by staff Oral Cavity - Dentition: Dentures, top Vision: Functional for self-feeding Self-Feeding Abilities: Able to feed self;Needs set up;Needs assist Patient Positioning: Upright in chair Baseline Vocal Quality: Low vocal intensity Volitional Cough: Weak Volitional Swallow: Able to elicit    Oral/Motor/Sensory Function Overall Oral Motor/Sensory Function: Within functional limits   Ice Chips Ice chips: Within functional limits Presentation: Spoon   Thin Liquid Thin Liquid: Impaired Presentation: Cup;Straw Oral Phase Functional Implications: Oral  holding Pharyngeal  Phase Impairments: Multiple swallows;Suspected delayed Swallow;Throat Clearing - Delayed    Nectar Thick Nectar Thick Liquid: Impaired Presentation: Cup;Straw Oral phase functional implications: Oral holding   Honey Thick Honey Thick Liquid: Not tested   Puree Puree: Within functional limits Presentation: Spoon   Solid   GO   Solid: Impaired Presentation: Self Fed Oral Phase Impairments: Impaired mastication;Poor awareness of bolus Oral Phase Functional Implications: Impaired mastication;Oral residue Pharyngeal Phase Impairments: Cough - Immediate        Arlana Lindau 04/20/2017,4:00 PM  Rondel Baton, MS, CCC-SLP Speech-Language Pathologist [phone]

## 2017-04-20 NOTE — Progress Notes (Signed)
      [address]       [name] [address]             [phone]      Alert and interactive  In good spirits  BP (!) 153/83   Pulse 70   Temp 97.6 F (36.4 C) (Axillary)   Resp 18   Ht 5' 6.5" (1.689 m)   Wt 136 lb 14.5 oz (62.1 kg)   SpO2 99%   BMI 21.77 kg/m    Intake/Output Summary (Last 24 hours) at 04/20/17 1803 Last data filed at 04/20/17 1600  Gross per 24 hour  Intake          1205.07 ml  Output             3155 ml  Net         -1949.93 ml   Istat K not checked after 4th run as requested  Received 9 runs of potassium  Repeat K this PM is > 7.5  I am not comfortable with the accuracy of any of her BMETs  Will have lab draw a new BMET  Remi Rester C. Dorris Fetch, MD [facility] [phone]

## 2017-04-20 NOTE — Progress Notes (Signed)
PULMONARY / CRITICAL CARE MEDICINE   Name: Amanda [name] MRN: [id number] DOB: 1940-09-19    ADMISSION DATE:  04/16/2017 CONSULTATION DATE:  10/12  REFERRING MD:  Cornelius Moras  CHIEF COMPLAINT:  Confusion and hypoxia  HISTORY OF PRESENT ILLNESS:   76 yo female with an extensive past medical history that is well documented below. She underwent MAZE and Minimally Invasive Mitral Valve repair with mini thoracotomy on 10/9. She has known chronic renal disease, esophogeal  stricture , BBB ,PPM  And following surgery developed hypoxia requiring NIMVS and FIO2 80%. Further complicated by agitation and confusion. She was started on precedex and PCCM asked to evaluate and assist in her care. Note CT head in 2016 small vessel changes compatible with ischemic small vessel. Note she has been on premarin as a outpatient medication. CTA chest 04/08/17 and reviewed.  SUBJECTIVE:  She is now off Precedex infusion sitting up in chair visiting with family in no distress VITAL SIGNS: BP (!) 156/84   Pulse 70   Temp (!) 97.5 F (36.4 C) (Axillary)   Resp 20   Ht 5' 6.5" (1.689 m)   Wt 136 lb 14.5 oz (62.1 kg)   SpO2 100%   BMI 21.77 kg/m  Nasal cannula at 4 L HEMODYNAMICS:    VENTILATOR SETTINGS: FiO2 (%):  [40 %-55 %] 55 %  INTAKE / OUTPUT: I/O last 3 completed shifts: In: 1071.7 [I.V.:621.7; IV Piggyback:450] Out: 5990 [Urine:5550; Chest Tube:440]  PHYSICAL EXAMINATION: General: Elderly female sitting up in bed in no distress still confused but appropriate and cooperative HEENT: Mucous membranes moist no JVD PSY: Remains a little confused but much more appropriate moves all extremities no distress CV: Paced rhythm  PULM: Right chest tube is in place, no air leak, serous sanguinous fluid. She is diminished bilaterally GI: The abdomen soft nontender no organomegaly no nausea or vomiting Extremities: Warm dry no edema Skin: Brisk capillary refill   LABS:  BMET  Recent Labs Lab  04/19/17 0416 04/20/17 0319 04/20/17 0614  NA 148* 145 150*  K 3.2* 3.7 2.3*  CL 107 98* 103  CO2 30 35* 35*  BUN 34* 49* 51*  CREATININE 1.77* 1.68* 1.64*  GLUCOSE 145* 595* 151*    Electrolytes  Recent Labs Lab 04/17/17 0354 04/17/17 1630  04/19/17 0416 04/20/17 0319 04/20/17 0614  CALCIUM 7.8*  --   < > 9.4 9.8 9.5  MG 2.5* 2.2  --   --  2.2  --   PHOS  --   --   --   --  4.8*  --   < > = values in this interval not displayed.  CBC  Recent Labs Lab 04/18/17 0435 04/19/17 0416 04/20/17 0319  WBC 24.7* 20.0* 21.6*  HGB 11.6* 12.0 12.7  HCT 34.0* 35.9* 39.6  PLT 96* 80* 95*    Coag's  Recent Labs Lab 04/16/17 1523 04/16/17 1858 04/16/17 2244  APTT 46* 46* 35  INR 1.78 1.66 0.87    Sepsis Markers No results for input(s): LATICACIDVEN, PROCALCITON, O2SATVEN in the last 168 hours.  ABG  Recent Labs Lab 04/17/17 1912 04/18/17 0505 04/19/17 2132  PHART 7.334* 7.373 7.520*  PCO2ART 38.1 40.6 37.9  PO2ART 74.0* 61.6* 69.7*    Liver Enzymes  Recent Labs Lab 04/19/17 0416 04/20/17 0319  AST 68* 100*  ALT 64* 102*  ALKPHOS 49 65  BILITOT 7.7* 9.4*  ALBUMIN 3.2* 3.2*    Cardiac Enzymes No results for input(s): TROPONINI, PROBNP in the  last 168 hours.  Glucose  Recent Labs Lab 04/19/17 1601 04/19/17 2004 04/19/17 2355 04/20/17 0409 04/20/17 0516 04/20/17 0836  GLUCAP 112* 210* 143* 137* 144* 187*    Imaging Dg Chest 1 View  Result Date: 04/19/2017 CLINICAL DATA:  Repositioned right PICC line EXAM: CHEST 1 VIEW COMPARISON:  04/19/2017 FINDINGS: Right-sided PICC line has been repositioned, tip overlying the level of the lower superior vena cava. Right-sided chest tube, right IJ a she appear unchanged. Left atrial appendage clip appears stable in position. Patient has a left-sided transvenous pacemaker with leads to the right atrium and right ventricle. The heart is enlarged. There is dense opacity at the left lung base, stable in  appearance. Patchy opacity at the right lung base is also stable in appearance. Elevation of the right hemidiaphragm is stable. IMPRESSION: Interval repositioning of right-sided PICC line. Stable cardiomegaly and bibasilar opacities, left greater than right. Electronically Signed   By: Norva Pavlov M.D.   On: 04/19/2017 14:57   Ct Head Wo Contrast  Result Date: 04/19/2017 CLINICAL DATA:  AMS - patient not following commands or communicating with staff. Yesterday she was up walking around. EXAM: CT HEAD WITHOUT CONTRAST TECHNIQUE: Contiguous axial images were obtained from the base of the skull through the vertex without intravenous contrast. COMPARISON:  04/01/2015 FINDINGS: Brain: No evidence of acute infarction, hemorrhage, hydrocephalus, extra-axial collection or mass lesion/mass effect. There is ventricular and sulcal enlargement reflecting mild diffuse atrophy, stable. Focal hypoattenuation in the right parietal lobe reflects an old infarct, stable. There is patchy white matter hypoattenuation bilaterally consistent with moderate chronic microvascular ischemic change. Vascular: No hyperdense vessel or unexpected calcification. Skull: Normal. Negative for fracture or focal lesion. Sinuses/Orbits: Globes and orbits are unremarkable. Sinuses and mastoid air cells are clear. Other: No change from prior head CT. IMPRESSION: 1. No acute intracranial abnormalities. 2. 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 Electronically Signed   By: Amie Portland M.D.   On: 04/19/2017 12:45   Dg Chest Port 1 View  Result Date: 04/19/2017 CLINICAL DATA:  Right sided PICC insertion. EXAM: PORTABLE CHEST 1 VIEW COMPARISON:  04/19/2017 at 6:18 a.m. FINDINGS: New right PICC tip projects in the right atrium. Right sided chest tubes and right internal jugular introducer sheath are stable. No pneumothorax. Persistent right lung base opacity is noted  consistent with atelectasis. No new lung abnormalities. IMPRESSION: 1. Right PICC line tip projects in the right atrium. No pneumothorax. 2. No other change from the prior study. Electronically Signed   By: Amie Portland M.D.   On: 04/19/2017 12:06     STUDIES:    CULTURES: 10/12 uc>>  ANTIBIOTICS: 10/9 zinacef>>  SIGNIFICANT EVENTS: 10/9 Maze and valve repair  LINES/TUBES: 10/9 chest tube>>  DISCUSSION: 76 yo female with an extensive pmh who underwent maze and minimal invasive mitral valve repair 10/9. Subsequently she has been hypoxic and confused. Due to her extensive and complicated history coupled with advanced age PCCM asked to help with sedation and pulmonary issues.  ASSESSMENT / PLAN:  NEUROLOGIC A: Acute encephalopathy status post maze and mitral valve repair 10/9 Hx of cerebral vascular ischemic changes 2016 head ct. -Improved when compared to yesterday -Precedex infusion: Now off P:   Discontinue Precedex from medication profile Correct water imbalance  Hold sedating medications Encourage formal sleep wake cycle  PULMONARY A: Former smoker Postoperative atelectasis P:   Encourage incentive spirometry and out of bed Wean oxygen as able  CARDIOVASCULAR  A: Post Mitral valve repair 10/9 Mini thoracotomy 10/9 Hx of BBB PPM HTN  P:  Per CVTS Cont tele  Catapres patch, when necessary hydralazine Warfarin per thoracic surgery  RENAL  Chronic kidney disease: scr stable Hypernatremia  Hypokalemia  P:   Avoid nephrotoxins Replace free water, if she passes her swallow eval her water intake orally should correct her hypernatremia Replace potassium Consider holding IV Lasix Follow-up a.m. chemistry  GASTROINTESTINAL  NPO Plan for TNA Hx of esophogeal dysmotility and stricture  P:   Swallowing eval pending Continue TNA per thoracic surgery team Continue proton pump inhibitor  HEMATOLOGIC  No acute issues post op P:  Trend CBC Transfuse  per protocol   ENDOCRINE   Hypothyroidism Hyperparathyroidism  P:   TSH 1.5 Continue Synthroid  FAMILY  - Updates: 10/12 daughter updated extensively per S. Minor NP  - Inter-disciplinary family meet or Palliative Care meeting due by:  day 7   We will be available as needed, she is continuing to improve, now off Precedex.  Simonne Martinet ACNP-BC [facility] Pulmonary/Critical Care Pager # [phone] OR # [phone] if no answer

## 2017-04-20 NOTE — Progress Notes (Addendum)
MEDICATION RELATED CONSULT NOTE   Pharmacy Consult for TPN - electrolyte replacement Indication: hypokalemia  Assessment: 76 yof started on TPN. Pt was hypokalemic this AM at 2.3. Received a total of 9 runs of potassium. Repeat potassium this evening remains very low at 2.5 despite aggressive repletion. MD has already ordered 4 runs of potassium.   Plan:  Give additional 4 runs of potassium for a total of 8 runs tonight F/u AM potassium level  Addendum: Per discussion with RN, MD is concerned about validity of labs so wants to do just the 4 runs for now and recheck K afterwards prior to any additional supplementation. Will defer to MD for further potassium supplementation tonight. TPN pharmacist to follow-up in the morning.   Mirayah Wren, Drake Leach 04/20/2017,7:19 PM

## 2017-04-21 ENCOUNTER — Inpatient Hospital Stay (HOSPITAL_COMMUNITY): Payer: Medicare Other

## 2017-04-21 LAB — GLUCOSE, CAPILLARY
GLUCOSE-CAPILLARY: 242 mg/dL — AB (ref 65–99)
GLUCOSE-CAPILLARY: 280 mg/dL — AB (ref 65–99)
Glucose-Capillary: 159 mg/dL — ABNORMAL HIGH (ref 65–99)
Glucose-Capillary: 162 mg/dL — ABNORMAL HIGH (ref 65–99)
Glucose-Capillary: 105 mg/dL — ABNORMAL HIGH (ref 65–99)

## 2017-04-21 LAB — BASIC METABOLIC PANEL
ANION GAP: 3 — AB (ref 5–15)
Anion gap: 11 (ref 5–15)
Anion gap: 12 (ref 5–15)
BUN: 58 mg/dL — ABNORMAL HIGH (ref 6–20)
BUN: 64 mg/dL — AB (ref 6–20)
BUN: 65 mg/dL — ABNORMAL HIGH (ref 6–20)
CALCIUM: 9 mg/dL (ref 8.9–10.3)
CALCIUM: 9.6 mg/dL (ref 8.9–10.3)
CALCIUM: 9.7 mg/dL (ref 8.9–10.3)
CO2: 37 mmol/L — ABNORMAL HIGH (ref 22–32)
CO2: 36 mmol/L — ABNORMAL HIGH (ref 22–32)
CO2: 35 mmol/L — AB (ref 22–32)
CREATININE: 1.63 mg/dL — AB (ref 0.44–1.00)
Chloride: 104 mmol/L (ref 101–111)
Chloride: 102 mmol/L (ref 101–111)
Chloride: 103 mmol/L (ref 101–111)
Creatinine, Ser: 1.77 mg/dL — ABNORMAL HIGH (ref 0.44–1.00)
Creatinine, Ser: 1.59 mg/dL — ABNORMAL HIGH (ref 0.44–1.00)
GFR calc Af Amer: 31 mL/min — ABNORMAL LOW (ref >60)
GFR calc non Af Amer: 30 mL/min — ABNORMAL LOW (ref >60)
GFR, EST AFRICAN AMERICAN: 34 mL/min — AB (ref >60)
GFR, EST AFRICAN AMERICAN: 35 mL/min — AB (ref >60)
GFR, EST NON AFRICAN AMERICAN: 29 mL/min — AB (ref >60)
GFR, EST NON AFRICAN AMERICAN: 26 mL/min — AB (ref >60)
GLUCOSE: 184 mg/dL — AB (ref 65–99)
GLUCOSE: 161 mg/dL — AB (ref 65–99)
Glucose, Bld: 188 mg/dL — ABNORMAL HIGH (ref 65–99)
POTASSIUM: 3.1 mmol/L — AB (ref 3.5–5.1)
Potassium: >7.5 mmol/L (ref 3.5–5.1)
Potassium: 3.1 mmol/L — ABNORMAL LOW (ref 3.5–5.1)
SODIUM: 144 mmol/L (ref 135–145)
SODIUM: 149 mmol/L — AB (ref 135–145)
Sodium: 150 mmol/L — ABNORMAL HIGH (ref 135–145)

## 2017-04-21 LAB — COMPREHENSIVE METABOLIC PANEL
ALBUMIN: 2.9 g/dL — AB (ref 3.5–5.0)
ALT: 113 U/L — ABNORMAL HIGH (ref 14–54)
ANION GAP: 13 (ref 5–15)
AST: 107 U/L — AB (ref 15–41)
Alkaline Phosphatase: 138 U/L — ABNORMAL HIGH (ref 38–126)
BILIRUBIN TOTAL: 6 mg/dL — AB (ref 0.3–1.2)
BUN: 74 mg/dL — AB (ref 6–20)
CHLORIDE: 101 mmol/L (ref 101–111)
CO2: 34 mmol/L — AB (ref 22–32)
Calcium: 9.7 mg/dL (ref 8.9–10.3)
Creatinine, Ser: 1.72 mg/dL — ABNORMAL HIGH (ref 0.44–1.00)
GFR calc Af Amer: 32 mL/min — ABNORMAL LOW (ref >60)
GFR calc non Af Amer: 27 mL/min — ABNORMAL LOW (ref >60)
GLUCOSE: 165 mg/dL — AB (ref 65–99)
POTASSIUM: 3 mmol/L — AB (ref 3.5–5.1)
SODIUM: 148 mmol/L — AB (ref 135–145)
Total Protein: 6 g/dL — ABNORMAL LOW (ref 6.5–8.1)

## 2017-04-21 LAB — CBC
HCT: 41.8 % (ref 36.0–46.0)
Hemoglobin: 13.5 g/dL (ref 12.0–15.0)
MCH: 29.3 pg (ref 26.0–34.0)
MCHC: 32.3 g/dL (ref 30.0–36.0)
MCV: 90.9 fL (ref 78.0–100.0)
PLATELETS: 126 10*3/uL — AB (ref 150–400)
RBC: 4.6 MIL/uL (ref 3.87–5.11)
RDW: 16.8 % — AB (ref 11.5–15.5)
WBC: 20.6 10*3/uL — AB (ref 4.0–10.5)

## 2017-04-21 MED ORDER — POTASSIUM CHLORIDE 10 MEQ/50ML IV SOLN
10.0000 meq | INTRAVENOUS | Status: AC | PRN
  Administered 2017-04-21 (×3): 10 meq via INTRAVENOUS
  Filled 2017-04-21: qty 50

## 2017-04-21 MED ORDER — ZINC TRACE METAL 1 MG/ML IV SOLN
INTRAVENOUS | Status: AC
  Administered 2017-04-21: 18:00:00 via INTRAVENOUS
  Filled 2017-04-21: qty 960

## 2017-04-21 MED ORDER — INSULIN ASPART 100 UNIT/ML ~~LOC~~ SOLN
0.0000 [IU] | Freq: Three times a day (TID) | SUBCUTANEOUS | Status: DC
  Administered 2017-04-21: 3 [IU] via SUBCUTANEOUS
  Administered 2017-04-21: 8 [IU] via SUBCUTANEOUS
  Administered 2017-04-22: 2 [IU] via SUBCUTANEOUS
  Administered 2017-04-24: 3 [IU] via SUBCUTANEOUS
  Administered 2017-04-25: 2 [IU] via SUBCUTANEOUS

## 2017-04-21 MED ORDER — POTASSIUM CHLORIDE 10 MEQ/50ML IV SOLN
10.0000 meq | INTRAVENOUS | Status: AC
  Administered 2017-04-21 (×3): 10 meq via INTRAVENOUS
  Filled 2017-04-21 (×3): qty 50

## 2017-04-21 MED ORDER — FUROSEMIDE 10 MG/ML IJ SOLN
20.0000 mg | Freq: Once | INTRAMUSCULAR | Status: AC
  Administered 2017-04-21: 20 mg via INTRAVENOUS
  Filled 2017-04-21: qty 2

## 2017-04-21 MED ORDER — INSULIN DETEMIR 100 UNIT/ML ~~LOC~~ SOLN
10.0000 [IU] | Freq: Every day | SUBCUTANEOUS | Status: DC
  Administered 2017-04-21 – 2017-04-22 (×2): 10 [IU] via SUBCUTANEOUS
  Filled 2017-04-21 (×2): qty 0.1

## 2017-04-21 NOTE — Progress Notes (Signed)
PHARMACY - ADULT TOTAL PARENTERAL NUTRITION CONSULT NOTE   Pharmacy Consult for TPN Indication: Intolerance to enteral feeds  Patient Measurements: Height: 5' 6.5" (168.9 cm) Weight: 136 lb 14.5 oz (62.1 kg) IBW/kg (Calculated) : 60.45 TPN AdjBW (KG): 64.4 Body mass index is 21.77 kg/m.  Assessment:  76 yo F presents for CVTS consultation with severe symtpomatic mitral valve regurgitation. PMH includes, mitral valve prolapse with regurgitation, HTN, CKD, Afib, HLD, hypothyroidism. Being admitted for elective procedure. She has remained reasonably active and is independent over last several months but has noticed increased SOB. Has been eating normally PTA. Has been on BiPAP most of the time this admit. Patient has become more agitated and combative with impulsive behavior. Pharmacy consulted for TPN as CVTS states will not tolerate feeding tube. This is day #4-5 of NPO status.  GI: TPN for intolerance to enteral feeds due to agitation. CVTS transitioned her to dysphagia 2 diet on 10/13. Appetite is fair. Albumin ok at 3.2. Prealbumin low at 15.1. PPI Endo: No hx of DM. CBGs now uncontrolled after TPN started (150-240s) Had probable lab error on Bmet with CBG of 595 but stick was fine near that time. On SSI, Synthroid Insulin requirements in the past 24 hours: 32 units Lytes: wnl today exc Na 149, K is trending up to 3.1 slowly after aggressive repletion yesterday of 13 runs. First K repeat yesterday was > 7.5? Must have been lab error even though does not say hemolysis as repeat draw 2hrs later was 2.5. Patient has already received 3 runs this am. CoCa 9.8. Phos elevated at 4.8, Mg ok at 2.2 Renal: SCr elevated at 1.77, CrCl ~49ml/min. UOP good. Net negative another 3 L yesterday. BUN up to 64 Pulm:Venturimask with 40% FiO2 Cards: S/p mitral valve repair, maze procedure, and re-exploration of R thoracotomy. Chest tube output stable at yesterday Hepatobil: LFTs elevated and trending up  and Tbili up to 9.4 Neuro: On Precedex gtt ID: Afebrile, WBC 21.6  Best Practices: Lovenox TPN Access: PICC double lumen >> TPN start date: 10/12 >>  Nutritional Goals (per RD recommendation on 10/12): KCal: 1750-1900 Protein: 80-95 g Fluid: > 1.6 L/day  Current Nutrition:  Transitioned to dysphagia 2 diet Clinimix  Plan:  Continue Clinimix E 5/15 at 79ml/hr Hold 20% lipid emulsion for first 7 days for ICU patients per ASPEN guidelines (Start date 10/19) Will increase TPN to goal as tolerated Add MVI and hold trace elements Add back Zinc 5mg , selenium , and chromium Continue CVTS SSI and adjust as needed CVTS adding low dose Levemir 10 units QHS Add 15 units of regular insulin to TPN tonight Monitor TPN labs, BUN / LFTs closely (acute increase may be causing some of her delirium)  May need to consider lower protein goals in acute hepatic failure F/U improvement in agitation and trial of TFs  Given 3 runs provided by post-op protocol. CVTS reordering labs  Consider cortrak placement and trial of TFs if agitation improves   Enzo Bi, PharmD, [facility] Clinical Pharmacist Pager [phone] 04/21/2017 7:09 AM

## 2017-04-21 NOTE — Progress Notes (Signed)
5 Days Post-Op Procedure(s) (LRB): RE-EXPLORATION RIGHT THORACOTOMY FOR BLEEDING (Right) Subjective: No complaints Oriented to person and place. calm  Objective: Vital signs in last 24 hours: Temp:  [97.6 F (36.4 C)-98.5 F (36.9 C)] 98 F (36.7 C) (10/14 0732) Pulse Rate:  [69-70] 70 (10/14 0800) Cardiac Rhythm: A-V Sequential paced (10/14 0800) Resp:  [12-21] 21 (10/14 0800) BP: (138-166)/(74-94) 164/82 (10/14 0800) SpO2:  [91 %-100 %] 91 % (10/14 0800) Weight:  [138 lb 14.2 oz (63 kg)] 138 lb 14.2 oz (63 kg) (10/14 0732)  Hemodynamic parameters for last 24 hours:    Intake/Output from previous day: 10/13 0701 - 10/14 0700 In: 1833.2 [I.V.:1433.2; IV Piggyback:400] Out: 2840 [Urine:2680; Chest Tube:160] Intake/Output this shift: Total I/O In: 70 [I.V.:70] Out: 70 [Chest Tube:70]  General appearance: alert, cooperative and no distress Neurologic: no focal weakness Heart: regular rate and rhythm Lungs: diminished breath sounds bibasilar Abdomen: normal findings: soft, non-tender  Lab Results:  Recent Labs  04/20/17 0319 04/21/17 0644  WBC 21.6* 20.6*  HGB 12.7 13.5  HCT 39.6 41.8  PLT 95* 126*   BMET:  Recent Labs  04/20/17 1806 04/21/17 0107  NA 147* 149*  K 2.5* 3.1*  CL 99* 102  CO2 37* 36*  GLUCOSE 190* 184*  BUN 59* 64*  CREATININE 1.76* 1.77*  CALCIUM 9.4 9.6    PT/INR: No results for input(s): LABPROT, INR in the last 72 hours. ABG    Component Value Date/Time   PHART 7.520 (H) 04/19/2017 2132   HCO3 30.8 (H) 04/19/2017 2132   TCO2 22 04/17/2017 1912   ACIDBASEDEF 1.3 04/18/2017 0505   O2SAT 94.5 04/19/2017 2132   CBG (last 3)   Recent Labs  04/20/17 1933 04/21/17 0339 04/21/17 0728  GLUCAP 219* 242* 159*    Assessment/Plan: S/P Procedure(s) (LRB): RE-EXPLORATION RIGHT THORACOTOMY FOR BLEEDING (Right) -  NEURO- delirium continues to improve  CV- stable in SR  RESP- continue IS  CXR with some vascular congestion-  continue gentle diuresis  RENAL_ awaiting repeat BMET after 1st was hemolyzed  Continue to have issues with drawing blood from PICC  ENDO- CBG elevated, will start low dose levemir   LOS: 5 days    Loreli Slot 04/21/2017

## 2017-04-21 NOTE — Progress Notes (Signed)
      [address]       [place] [address]             [phone]      Alert  BP (!) 164/101   Pulse 74   Temp 97.8 F (36.6 C) (Oral)   Resp 19   Ht 5' 6.5" (1.689 m)   Wt 138 lb 14.2 oz (63 kg)   SpO2 92%   BMI 22.08 kg/m    Intake/Output Summary (Last 24 hours) at 04/21/17 1823 Last data filed at 04/21/17 1800  Gross per 24 hour  Intake          1517.67 ml  Output             1910 ml  Net          -392.33 ml   K= 3.1- being supplemented Creatinine 1.6  Passed swallow but PO intake minimal  Making progress  Wyoma Genson C. Dorris Fetch, MD [facility] [phone]

## 2017-04-21 NOTE — Progress Notes (Signed)
  Speech Language Pathology Treatment: Dysphagia  Patient Details Name: Amanda [name] MRN: [id number] DOB: 13-Sep-1940 Today's Date: 04/21/2017 Time: 1005-1030 SLP Time Calculation (min) (ACUTE ONLY): 25 min  Assessment / Plan / Recommendation Clinical Impression  Patient seen for dysphagia treatment. CNA present, just completed pt's bath. Reports she fed pt this morning and pt was pocketing solids. Pt states "I had a hard time with the meat." Fully alert, cognition improving though she remains mildly confused. SLP assisted pt with self-feeding of nectar thick liquids, purees. Pt required tactile assistance for bolus retrieval. Noted with prolonged oral holding with liquids >solids, though this may be self-compensatory as airway protection appears complete with cup sips of nectar. With larger boluses or multiple sips of nectar via straw, pt presents with throat clearing and delayed cough, suggestive of reduced airway protection. Recommend downgrade of solids to dys 1 (puree), continue nectar thick liquids via cup, no straws. CNA, husband present; educated on strategies for feeding including slow rate, small bites, assisting pt with self-feeding vs feeding her to increase awareness/sensation. Anticipate advancement as her cognition and overall medical status improve.     HPI HPI: 75 yo female with an extensive pmh including esophageal dysmotility and stricture, s/p balloon dilation (02/19/17), who underwent maze and minimal invasive mitral valve repair 10/9. Subsequently encephalopathic, hypoxic requiring bipap. 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 Referred for swallowing evaluation.      SLP Plan  Continue with current plan of care       Recommendations  Diet recommendations: Dysphagia 1 (puree);Nectar-thick liquid Liquids provided via: Cup;No straw Medication Administration: Whole meds with  puree Supervision: Staff to assist with self feeding;Full supervision/cueing for compensatory strategies Compensations: Slow rate;Small sips/bites;Minimize environmental distractions Postural Changes and/or Swallow Maneuvers: Seated upright 90 degrees                General recommendations: OT consult Oral Care Recommendations: Oral care BID Follow up Recommendations: Skilled Nursing facility SLP Visit Diagnosis: Dysphagia, unspecified (R13.10) Plan: Continue with current plan of care       GO               Rondel Baton, MS, CCC-SLP Speech-Language Pathologist [phone]  Amanda [name] 04/21/2017, 10:38 AM

## 2017-04-21 NOTE — Evaluation (Signed)
Physical Therapy Evaluation Patient Details Name: Amanda [name] MRN: [id number] DOB: 29-Dec-1940 Today's Date: 04/21/2017   History of Present Illness  76 yo female with an extensive pmh including esophageal dysmotility and stricture, s/p balloon dilation (02/19/17), who underwent maze and minimal invasive mitral valve repair 10/9. Subsequently encephalopathic, hypoxic requiring bipap. 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  Clinical Impression  Pt very deconditioned and fatigued. Pt did participate in PT however requires maxA for all mobility. Pt was indep PTA. Pt to benefit from CIR upon d/c for maximal functional recovery.    Follow Up Recommendations CIR    Equipment Recommendations   (TBD)    Recommendations for Other Services Rehab consult     Precautions / Restrictions Precautions Precautions: Fall Precaution Comments: chest tube in R side Restrictions Weight Bearing Restrictions: Yes Other Position/Activity Restrictions: sternal precautions      Mobility  Bed Mobility Overal bed mobility: Needs Assistance Bed Mobility: Sit to Supine       Sit to supine: Mod assist;+2 for physical assistance   General bed mobility comments: assist for descending trunk into bed and LE management back into bed  Transfers Overall transfer level: Needs assistance Equipment used:  (1 person lift from the front) Transfers: Sit to/from [employer] Sit to Stand: Max assist Stand pivot transfers: Max assist;+2 safety/equipment       General transfer comment: pt with strong posterior resistance, required tactile cues at upper thoracic back to lean forward. v/c's to give PT a hug. v/c's to sequence stepping during std pvt transfer, pt unable to maintain upright trunk or head off PTs shoulder due to fatigue. Pt stood x1 and marched in place x 5, then complete 1 std pvt  transfer back to bed. second std pt with fatigue and unable to maintain terminal knee extension  Ambulation/Gait             General Gait Details: unable at this time  Stairs            Wheelchair Mobility    Modified Rankin (Stroke Patients Only)       Balance Overall balance assessment: Needs assistance Sitting-balance support: Feet supported;Bilateral upper extremity supported Sitting balance-Leahy Scale: Poor Sitting balance - Comments: pt with retropulsion Postural control: Posterior lean Standing balance support: Bilateral upper extremity supported Standing balance-Leahy Scale: Poor Standing balance comment: dependent on PT                             Pertinent Vitals/Pain Pain Assessment: Faces (unable to state number) Faces Pain Scale: Hurts even more Pain Location: R side Pain Descriptors / Indicators: Constant Pain Intervention(s): Monitored during session    Home Living Family/patient expects to be discharged to:: Inpatient rehab                 Additional Comments: pt was living at home with spouse in 1 story home with 3 STE, walk in shower with seat in it    Prior Function Level of Independence: Independent         Comments: driving, dressing, bathing, walking without AD     Hand Dominance   Dominant Hand: Right    Extremity/Trunk Assessment   Upper Extremity Assessment Upper Extremity Assessment: Generalized weakness    Lower Extremity Assessment Lower Extremity Assessment: Generalized weakness    Cervical / Trunk Assessment Cervical / Trunk  Assessment: Other exceptions Cervical / Trunk Exceptions: sternal incision and chest tube  Communication   Communication: No difficulties  Cognition Arousal/Alertness: Lethargic Behavior During Therapy: Flat affect Overall Cognitive Status: Impaired/Different from baseline Area of Impairment: Attention;Safety/judgement;Problem solving                    Current Attention Level: Focused     Safety/Judgement: Decreased awareness of safety;Decreased awareness of deficits   Problem Solving: Slow processing;Decreased initiation;Difficulty sequencing;Requires verbal cues;Requires tactile cues General Comments: pt with lethargy/fatigue and had difficulty follow commands during MMT      General Comments      Exercises     Assessment/Plan    PT Assessment Patient needs continued PT services  PT Problem List Decreased strength;Decreased range of motion;Decreased activity tolerance;Decreased balance;Decreased mobility;Decreased cognition;Decreased knowledge of use of DME;Decreased safety awareness;Decreased knowledge of precautions;Pain       PT Treatment Interventions DME instruction;Gait training;Stair training;Functional mobility training;Therapeutic activities;Therapeutic exercise;Balance training;Neuromuscular re-education    PT Goals (Current goals can be found in the Care Plan section)  Acute Rehab PT Goals Patient Stated Goal: didn't state PT Goal Formulation: With patient/family Time For Goal Achievement: 05/05/17 Potential to Achieve Goals: Good    Frequency Min 3X/week   Barriers to discharge        Co-evaluation               AM-PAC PT "6 Clicks" Daily Activity  Outcome Measure Difficulty turning over in bed (including adjusting bedclothes, sheets and blankets)?: Unable Difficulty moving from lying on back to sitting on the side of the bed? : Unable Difficulty sitting down on and standing up from a chair with arms (e.g., wheelchair, bedside commode, etc,.)?: A Lot Help needed moving to and from a bed to chair (including a wheelchair)?: A Lot Help needed walking in hospital room?: Total Help needed climbing 3-5 steps with a railing? : Total 6 Click Score: 8    End of Session   Activity Tolerance: Patient tolerated treatment well;Patient limited by fatigue Patient left: in bed;with call bell/phone within  reach;with family/visitor present Nurse Communication: Mobility status PT Visit Diagnosis: Unsteadiness on feet (R26.81);Muscle weakness (generalized) (M62.81);Difficulty in walking, not elsewhere classified (R26.2)    Time: 1610-9604 PT Time Calculation (min) (ACUTE ONLY): 28 min   Charges:   PT Evaluation $PT Eval Moderate Complexity: 1 Mod PT Treatments $Therapeutic Activity: 8-22 mins   PT G Codes:        Lewis Shock, PT, DPT Pager #: [phone] Office #: [phone]   Makila Colombe M Dondi Burandt 04/21/2017, 2:15 PM

## 2017-04-22 ENCOUNTER — Telehealth: Payer: Self-pay | Admitting: Cardiology

## 2017-04-22 ENCOUNTER — Encounter: Payer: Medicare Other | Admitting: *Deleted

## 2017-04-22 LAB — COMPREHENSIVE METABOLIC PANEL
ALK PHOS: 131 U/L — AB (ref 38–126)
ALT: 105 U/L — AB (ref 14–54)
AST: 85 U/L — AB (ref 15–41)
Albumin: 2.9 g/dL — ABNORMAL LOW (ref 3.5–5.0)
Anion gap: 12 (ref 5–15)
BUN: 73 mg/dL — AB (ref 6–20)
CALCIUM: 9.7 mg/dL (ref 8.9–10.3)
CHLORIDE: 101 mmol/L (ref 101–111)
CO2: 39 mmol/L — AB (ref 22–32)
CREATININE: 1.71 mg/dL — AB (ref 0.44–1.00)
GFR, EST AFRICAN AMERICAN: 32 mL/min — AB (ref >60)
GFR, EST NON AFRICAN AMERICAN: 27 mL/min — AB (ref >60)
Glucose, Bld: 154 mg/dL — ABNORMAL HIGH (ref 65–99)
Potassium: 2.8 mmol/L — ABNORMAL LOW (ref 3.5–5.1)
Sodium: 152 mmol/L — ABNORMAL HIGH (ref 135–145)
Total Bilirubin: 4.6 mg/dL — ABNORMAL HIGH (ref 0.3–1.2)
Total Protein: 6 g/dL — ABNORMAL LOW (ref 6.5–8.1)

## 2017-04-22 LAB — DIFFERENTIAL
Basophils Absolute: 0.1 10*3/uL (ref 0.0–0.1)
Basophils Relative: 0 %
EOS PCT: 3 %
Eosinophils Absolute: 0.7 10*3/uL (ref 0.0–0.7)
LYMPHS PCT: 13 %
Lymphs Abs: 2.7 10*3/uL (ref 0.7–4.0)
MONO ABS: 1.7 10*3/uL — AB (ref 0.1–1.0)
MONOS PCT: 8 %
NEUTROS ABS: 15.4 10*3/uL — AB (ref 1.7–7.7)
Neutrophils Relative %: 75 %

## 2017-04-22 LAB — GLUCOSE, CAPILLARY
GLUCOSE-CAPILLARY: 144 mg/dL — AB (ref 65–99)
GLUCOSE-CAPILLARY: 81 mg/dL (ref 65–99)
Glucose-Capillary: 119 mg/dL — ABNORMAL HIGH (ref 65–99)
Glucose-Capillary: 104 mg/dL — ABNORMAL HIGH (ref 65–99)
Glucose-Capillary: 150 mg/dL — ABNORMAL HIGH (ref 65–99)

## 2017-04-22 LAB — CBC
HCT: 45.5 % (ref 36.0–46.0)
HEMOGLOBIN: 14.1 g/dL (ref 12.0–15.0)
MCH: 28.8 pg (ref 26.0–34.0)
MCHC: 31 g/dL (ref 30.0–36.0)
MCV: 92.9 fL (ref 78.0–100.0)
PLATELETS: 84 10*3/uL — AB (ref 150–400)
RBC: 4.9 MIL/uL (ref 3.87–5.11)
RDW: 16.9 % — ABNORMAL HIGH (ref 11.5–15.5)
WBC: 20.6 10*3/uL — AB (ref 4.0–10.5)

## 2017-04-22 LAB — PHOSPHORUS: Phosphorus: 2.3 mg/dL — ABNORMAL LOW (ref 2.5–4.6)

## 2017-04-22 LAB — PREALBUMIN: Prealbumin: 18.9 mg/dL (ref 18–38)

## 2017-04-22 LAB — MAGNESIUM: MAGNESIUM: 1.9 mg/dL (ref 1.7–2.4)

## 2017-04-22 LAB — TRIGLYCERIDES: Triglycerides: 197 mg/dL — ABNORMAL HIGH (ref <150)

## 2017-04-22 MED ORDER — ZINC TRACE METAL 1 MG/ML IV SOLN
INTRAVENOUS | Status: AC
  Administered 2017-04-22: 17:00:00 via INTRAVENOUS
  Filled 2017-04-22: qty 960

## 2017-04-22 MED ORDER — WARFARIN - PHYSICIAN DOSING INPATIENT
Freq: Every day | Status: DC
  Administered 2017-04-24: 1

## 2017-04-22 MED ORDER — ATENOLOL 25 MG PO TABS
50.0000 mg | ORAL_TABLET | Freq: Every day | ORAL | Status: DC
  Administered 2017-04-22 – 2017-04-26 (×5): 50 mg via ORAL
  Filled 2017-04-22 (×6): qty 2

## 2017-04-22 MED ORDER — POTASSIUM CHLORIDE 10 MEQ/50ML IV SOLN
10.0000 meq | INTRAVENOUS | Status: AC
  Administered 2017-04-22 (×3): 10 meq via INTRAVENOUS
  Filled 2017-04-22 (×2): qty 50

## 2017-04-22 MED ORDER — WARFARIN SODIUM 2.5 MG PO TABS
2.5000 mg | ORAL_TABLET | Freq: Every day | ORAL | Status: DC
  Administered 2017-04-22 – 2017-04-24 (×3): 2.5 mg via ORAL
  Filled 2017-04-22 (×3): qty 1

## 2017-04-22 MED ORDER — ENSURE ENLIVE PO LIQD
237.0000 mL | Freq: Two times a day (BID) | ORAL | Status: DC
  Administered 2017-04-22: 60 mL via ORAL
  Administered 2017-04-23 – 2017-04-26 (×7): 237 mL via ORAL

## 2017-04-22 MED ORDER — POTASSIUM CHLORIDE 10 MEQ/50ML IV SOLN
10.0000 meq | INTRAVENOUS | Status: AC
  Administered 2017-04-22 (×3): 10 meq via INTRAVENOUS
  Filled 2017-04-22 (×4): qty 50

## 2017-04-22 NOTE — Progress Notes (Signed)
Nutrition Follow-up  INTERVENTION:   Magic cup TID with meals, each supplement provides 290 kcal and 9 grams of protein  Ensure Enlive po BID, each supplement provides 350 kcal and 20 grams of protein  Recommend wean TPN as pt is tolerating PO diet.    NUTRITION DIAGNOSIS:   Inadequate oral intake related to  (decreased appetite) as evidenced by per patient/family report, meal completion < 50%. Ongoing.   GOAL:   Patient will meet greater than or equal to 90% of their needs Progressing.   MONITOR:   I & O's, Diet advancement, PO intake, Supplement acceptance  ASSESSMENT:   Pt with PMH of HTN, CKD stage III, heart block s/p pacemater, afib, esophageal dysmotility s/p EGD with esophageal dilatation, severe MV prolapse admitted for MV repair and MAZE procedure on 10/9. Pt extubated 10/10 but remains on BiPAP.   Pt alert and able to answer questions.  Pt ate a few bites of her meal this am but did not want to overdo it. Pt has had ensure before and is willing to try this and magic cup until appetite is better.   Chest tubes x 2: 95 ml out total x 24 hours  Remains on TPN: Clinimix E 5/15 @ 40 ml/hr: 960 ml, 681 kcal and 48 grams protein  Medications reviewed and include: levemir, synthroid Labs reviewed: Na 152 (H), K+ 2.8 (L), PO4 2.3 (L); CBG's: [phone]   Diet Order:  .TPN (CLINIMIX-E) Adult DIET - DYS 1 Room service appropriate? Yes; Fluid consistency: Nectar Thick .TPN (CLINIMIX-E) Adult  Skin:   (Incisions)  Last BM:  10/15  Height:   Ht Readings from Last 1 Encounters:  04/16/17 5' 6.5" (1.689 m)    Weight:   Wt Readings from Last 1 Encounters:  04/22/17 135 lb 5.8 oz (61.4 kg)    Ideal Body Weight:  60.2 kg  BMI:  Body mass index is 21.52 kg/m.  Estimated Nutritional Needs:   Kcal:  1750-1900  Protein:  80-95 grams  Fluid:  Per MD  EDUCATION NEEDS:   No education needs identified at this time  Kendell Bane RD, LDN, CNSC [phone]  Pager [phone] After Hours Pager

## 2017-04-22 NOTE — Progress Notes (Addendum)
  Speech Language Pathology Treatment: Dysphagia  Patient Details Name: Amanda [name] MRN: [id number] DOB: May 03, 1941 Today's Date: 04/22/2017 Time: 9379-0240 SLP Time Calculation (min) (ACUTE ONLY): 23 min  Assessment / Plan / Recommendation Clinical Impression  Pt's daughter and husband present; reviewed swallow assessment and treatment plan including clinical reasoning for downgrade to puree and thickener for liquids. Noted mild left labial asymetry- CT noted old right infarct. She is progressing well; held cup independently for trials thin water with consistent delayed throat clear, not noted with nectar juice. Pt aware she  "swishes" juice prior to swallow intermittently. Slightly confused although cognition improving. Recommend continuing with Dys 1, nectar for today with possible MBS in next 1-2 days for potential upgrade if safe and efficient.    HPI HPI: 76 yo female with an extensive pmh including esophageal dysmotility and stricture, s/p balloon dilation (02/19/17), who underwent maze and minimal invasive mitral valve repair 10/9. Subsequently encephalopathic, hypoxic requiring bipap. 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 Referred for swallowing evaluation.      SLP Plan  Continue with current plan of care       Recommendations  Diet recommendations: Dysphagia 1 (puree);Nectar-thick liquid Liquids provided via: Cup;No straw Medication Administration: Whole meds with puree Supervision: Patient able to self feed;Full supervision/cueing for compensatory strategies Compensations: Slow rate;Small sips/bites Postural Changes and/or Swallow Maneuvers: Seated upright 90 degrees                Oral Care Recommendations: Oral care BID Follow up Recommendations: Inpatient Rehab SLP Visit Diagnosis: Dysphagia, unspecified (R13.10) Plan: Continue with current plan of care        GO                [name] 04/22/2017, 11:15 AM  [name] Lonell Face.Ed [employer] [phone]

## 2017-04-22 NOTE — Progress Notes (Signed)
PHARMACY - ADULT TOTAL PARENTERAL NUTRITION CONSULT NOTE   Pharmacy Consult for TPN Indication: Intolerance to enteral feeds  Patient Measurements: Height: 5' 6.5" (168.9 cm) Weight: 135 lb 5.8 oz (61.4 kg) IBW/kg (Calculated) : 60.45 TPN AdjBW (KG): 64.4 Body mass index is 21.52 kg/m.  Assessment:  76 yo F presents for CVTS consultation with severe symtpomatic mitral valve regurgitation. PMH includes mitral valve prolapse with regurgitation, HTN, CKD, Afib, HLD, hypothyroidism. Being admitted for elective procedure. She has remained reasonably active and is independent over last several months but has noticed increased SOB. Has been eating normally PTA. Has been on BiPAP most of the time this admit. Patient has become more agitated and combative with impulsive behavior. Pharmacy consulted for TPN as CVTS states will not tolerate feeding tube. This is day #4-5 of NPO status.  GI: TPN for intolerance to feeding tube/enteral feeds due to agitation. CVTS transitioned her to dysphagia 2 diet on 10/13. Appetite is fair. Albumin 2.9. Prealbumin 15.1>18.9. Last BM documented as PTA. - Meds: Dulcolax supp daily, PPI IV.  Endo: Home Syntrhoid. No hx of DM. CBGs now uncontrolled after TPN started (105-280 last 24h) On SSI, Synthroid. Running [phone] 61ml/hr due to hypernatremia Insulin requirements in the past 24 hours: 13 units SSI + 10 units Levemir/hs  Lytes: Na 148>152. K remains low 2.8. CoCa 10.58. Phos now slightly low at 2.3 (4.8 on 10/13), Mg ok at 1.9  Renal: SCr elevated at 1.71, CrCl ~40ml/min. UOP good. I/O -449 (total -4.7L).  BUN up to 73  Pulm:RA  Cards: S/p mitral valve repair, maze procedure, and re-exploration of R thoracotomy for bleeding. Chest tube output 145 ml/24h. Vascular congestion-gentle diuresis.  - Meds: Atenolol, Clonidine patch, warfarin  Hepatobil: LFTs elevated super slowly trending down and Tbili down to 4.6 (max 9.4)  Neuro:  ID: Afebrile, WBC 20.6  Best  Practices: Lovenox 30/24h TPN Access: PICC double lumen >> TPN start date: 10/12 >>  Nutritional Goals (per RD recommendation on 10/12): KCal: 1750-1900 Protein: 80-95 g Fluid: > 1.6 L/day  Current Nutrition:  Transitioned to dysphagia 1 diet ( po intake documented 10/14) Clinimix  Plan:  Continue Clinimix E 5/15 at 33ml/hr - Hold 20% lipid emulsion for first 7 days for ICU patients per ASPEN guidelines (Start date 10/19) Add MVI and hold trace elements due to elevated Tbili Add back Zinc 5mg , selenium , and chromium Continue CVTS SSI and adjust as needed Continue with 15 units of regular insulin in TPN tonight MD notes to continue TNA today and possibly stop tomorrow if oral intake sufficient Giving 6 runs K+ ordered thus far per MD. Recheck Phos and consider supplementation tomorrow if remains low.   Ariz Terrones S. Merilynn Finland, PharmD, [facility] Clinical Staff Pharmacist Pager [phone]  04/22/2017 10:38 AM

## 2017-04-22 NOTE — Telephone Encounter (Signed)
LMOVM reminding pt to send remote transmission.   

## 2017-04-22 NOTE — Progress Notes (Addendum)
      [address]       [name] [address]             [phone]        CARDIOTHORACIC SURGERY PROGRESS NOTE  R6 Days Post-OpS/P Procedure(s) (LRB): MINIMALLY INVASIVE MITRAL VALVE REPAIR (MVR) (Right) MINIMALLY INVASIVE MAZE PROCEDURE (N/A) TRANSESOPHAGEAL ECHOCARDIOGRAM (TEE) (N/A) CLIPPING OF ATRIAL APPENDAGE using AtriCure Pro2 clip 45  R6 Days Post-Op Procedure(s) (LRB): RE-EXPLORATION RIGHT THORACOTOMY FOR BLEEDING (Right)  Subjective: Looks much better.  Alert and oriented.  Follows commands.  Denies pain, SOB.  Feels "weak"  Objective: Vital signs: BP Readings from Last 1 Encounters:  04/22/17 (!) 148/70   Pulse Readings from Last 1 Encounters:  04/22/17 76   Resp Readings from Last 1 Encounters:  04/22/17 16   Temp Readings from Last 1 Encounters:  04/22/17 98.2 F (36.8 C) (Oral)    Hemodynamics:    Physical Exam:  Rhythm:   AV paced  Breath sounds: clear  Heart sounds:  RRR w/out murmur  Incisions:  Clean and dry  Abdomen:  Soft, non-distended, non-tender  Extremities:  Warm, well-perfused  Chest tubes:  low volume thin serosanguinous output, no air leak    Intake/Output from previous day: 10/14 0701 - 10/15 0700 In: 1355.7 [P.O.:240; I.V.:1115.7] Out: 1795 [Urine:1700; Chest Tube:95] Intake/Output this shift: Total I/O In: 260 [P.O.:60; I.V.:100; IV Piggyback:100] Out: 195 [Urine:125; Chest Tube:70]  Lab Results:  CBC: Recent Labs  04/21/17 0644 04/22/17 0419  WBC 20.6* 20.6*  HGB 13.5 14.1  HCT 41.8 45.5  PLT 126* 84*    BMET:  Recent Labs  04/21/17 1850 04/22/17 0419  NA 148* 152*  K 3.0* 2.8*  CL 101 101  CO2 34* 39*  GLUCOSE 165* 154*  BUN 74* 73*  CREATININE 1.72* 1.71*  CALCIUM 9.7 9.7     PT/INR:  No results for input(s): LABPROT, INR in the last 72 hours.  CBG (last 3)   Recent Labs  04/21/17 1140 04/21/17 1623 04/21/17 2121  GLUCAP 280* 162* 105*    ABG    Component Value  Date/Time   PHART 7.520 (H) 04/19/2017 2132   PCO2ART 37.9 04/19/2017 2132   PO2ART 69.7 (L) 04/19/2017 2132   HCO3 30.8 (H) 04/19/2017 2132   TCO2 22 04/17/2017 1912   ACIDBASEDEF 1.3 04/18/2017 0505   O2SAT 94.5 04/19/2017 2132    CXR: n/a  Assessment/Plan:  Overall stable now POD6 Maintaining AV paced rhythm w/ stable BP Breathing comfortably w/ O2 sats 95% on RA Postop delirium resolving/resolved Elevated serum creatinine, likely due to prerenal azotemia +/- acute kidney injury caused by ATN Hypernatremia likely due to dehydration, weight now below preop baseline Hypokalemia, induced by loop diuretics Leukocytosis w/out fever, likely reactive   Advance diet  Continue TNA today and consider stopping tomorrow of oral intake sufficiency  Hold diuretics  Supplement potassium  D/C chest tubes and Foley  Mobilize  PT  Start Coumadin  Restart atenolol  Purcell Nails, MD 04/22/2017 8:44 AM

## 2017-04-22 NOTE — Progress Notes (Signed)
Rehab Admissions Coordinator Note:  Patient was screened by Trish Mage for appropriateness for an Inpatient Acute Rehab Consult.  At this time, we are recommending Inpatient Rehab consult.  Lelon Frohlich M 04/22/2017, 10:39 AM  I can be reached at [phone].

## 2017-04-22 NOTE — Progress Notes (Signed)
Patient ID: Amanda [name], female   DOB: 1940/08/13, 76 y.o.   MRN: [id number] EVENING ROUNDS NOTE :     [address]       [place] [address]             [phone]                 6 Days Post-Op Procedure(s) (LRB): RE-EXPLORATION RIGHT THORACOTOMY FOR BLEEDING (Right)  Total Length of Stay:  LOS: 6 days  BP (!) 147/73   Pulse 71   Temp (!) 96.3 F (35.7 C) (Oral)   Resp (!) 28   Ht 5' 6.5" (1.689 m)   Wt 135 lb 5.8 oz (61.4 kg)   SpO2 95%   BMI 21.52 kg/m   .Intake/Output      10/14 0701 - 10/15 0700 10/15 0701 - 10/16 0700   P.O. 240 120   I.V. (mL/kg) 1115.7 (18.2) 550 (9)   IV Piggyback  150   Total Intake(mL/kg) 1355.7 (22.1) 820 (13.4)   Urine (mL/kg/hr) 1700 (1.2) 225 (0.3)   Chest Tube 95 130   Total Output 1795 355   Net -439.3 +465        Urine Occurrence  2 x   Stool Occurrence  2 x     . Marland KitchenTPN (CLINIMIX-E) Adult 40 mL/hr at 04/22/17 1728  . sodium chloride    . dextrose 10 mL/hr at 04/21/17 2000  . lactated ringers       Lab Results  Component Value Date   WBC 20.6 (H) 04/22/2017   HGB 14.1 04/22/2017   HCT 45.5 04/22/2017   PLT 84 (L) 04/22/2017   GLUCOSE 154 (H) 04/22/2017   CHOL 153 01/07/2015   TRIG 197 (H) 04/22/2017   HDL 51 01/07/2015   LDLCALC 89 01/07/2015   ALT 105 (H) 04/22/2017   AST 85 (H) 04/22/2017   NA 152 (H) 04/22/2017   K 2.8 (L) 04/22/2017   CL 101 04/22/2017   CREATININE 1.71 (H) 04/22/2017   BUN 73 (H) 04/22/2017   CO2 39 (H) 04/22/2017   TSH 1.488 04/19/2017   INR 0.87 04/16/2017   HGBA1C 5.8 (H) 04/12/2017   Slow improvement k being replaced  Chronic Kidney Disease   Stage I     GFR >90  Stage II    GFR 60-89  Stage IIIA GFR 45-59  Stage IIIB GFR 30-44  Stage IV   GFR 15-29  Stage V    GFR  <15  Lab Results  Component Value Date   CREATININE 1.71 (H) 04/22/2017   Estimated Creatinine Clearance: 25.9 mL/min (A) (by C-G formula based on SCr of 1.71 mg/dL (H)).   Delight Ovens MD  Beeper [phone] Office [phone] 04/22/2017 6:09 PM

## 2017-04-23 ENCOUNTER — Inpatient Hospital Stay (HOSPITAL_COMMUNITY): Payer: Medicare Other

## 2017-04-23 LAB — GLUCOSE, CAPILLARY
GLUCOSE-CAPILLARY: 109 mg/dL — AB (ref 65–99)
GLUCOSE-CAPILLARY: 94 mg/dL (ref 65–99)
Glucose-Capillary: 106 mg/dL — ABNORMAL HIGH (ref 65–99)
Glucose-Capillary: 82 mg/dL (ref 65–99)

## 2017-04-23 LAB — BASIC METABOLIC PANEL
ANION GAP: 8 (ref 5–15)
BUN: 71 mg/dL — ABNORMAL HIGH (ref 6–20)
CALCIUM: 9.7 mg/dL (ref 8.9–10.3)
CO2: 37 mmol/L — ABNORMAL HIGH (ref 22–32)
Chloride: 105 mmol/L (ref 101–111)
Creatinine, Ser: 1.56 mg/dL — ABNORMAL HIGH (ref 0.44–1.00)
GFR, EST AFRICAN AMERICAN: 36 mL/min — AB (ref >60)
GFR, EST NON AFRICAN AMERICAN: 31 mL/min — AB (ref >60)
Glucose, Bld: 124 mg/dL — ABNORMAL HIGH (ref 65–99)
Potassium: 3.5 mmol/L (ref 3.5–5.1)
SODIUM: 150 mmol/L — AB (ref 135–145)

## 2017-04-23 LAB — PROTIME-INR
INR: 1.18
PROTHROMBIN TIME: 14.9 s (ref 11.4–15.2)

## 2017-04-23 LAB — PHOSPHORUS: PHOSPHORUS: 2.9 mg/dL (ref 2.5–4.6)

## 2017-04-23 MED ORDER — POTASSIUM CHLORIDE 10 MEQ/50ML IV SOLN
10.0000 meq | INTRAVENOUS | Status: AC
  Administered 2017-04-23 (×3): 10 meq via INTRAVENOUS
  Filled 2017-04-23 (×3): qty 50

## 2017-04-23 NOTE — Care Management Note (Signed)
Case Management Note  Patient Details  Name: Amanda [name] MRN: [id number] Date of Birth: 02/20/1941  Subjective/Objective:      Pt is s/p MAZE - required precedex post for delirum              Action/Plan:   Pt has sitter, on venti mask.  No family at bedside.  CM will continue to follow for discharge needs   Expected Discharge Date:                  Expected Discharge Plan:     In-House Referral:     Discharge planning Services     Post Acute Care Choice:    Choice offered to:     DME Arranged:    DME Agency:     HH Arranged:    HH Agency:     Status of Service:     If discussed at [employer] of Stay Meetings, dates discussed:    Additional Comments: 04/23/2017 CM requested CIR rehab consult per PT recommendation.  Currently per PT notes pt is not fully able to participate in therapy - CSW consulted for back up plan Cherylann Parr, RN 04/23/2017, 9:25 AM

## 2017-04-23 NOTE — Progress Notes (Signed)
PHARMACY - ADULT TOTAL PARENTERAL NUTRITION CONSULT NOTE   Pharmacy Consult for TPN Indication: Intolerance to enteral feeds  Patient Measurements: Height: 5' 6.5" (168.9 cm) Weight: 135 lb 5.8 oz (61.4 kg) IBW/kg (Calculated) : 60.45 TPN AdjBW (KG): 64.4 Body mass index is 21.52 kg/m.  Assessment:  76 yo F presents for CVTS consultation with severe symtpomatic mitral valve regurgitation. PMH includes mitral valve prolapse with regurgitation, HTN, CKD, Afib, HLD, hypothyroidism. Being admitted for elective procedure. She has remained reasonably active and is independent over last several months but has noticed increased SOB. Has been eating normally PTA. Has been on BiPAP most of the time this admit. Patient has become more agitated and combative with impulsive behavior. Pharmacy consulted for TPN as CVTS states will not tolerate feeding tube. This is day #4-5 of NPO status.  GI: TPN for intolerance to feeding tube/enteral feeds due to agitation. CVTS transitioned her to dysphagia 2 diet on 10/13. Appetite is fair. Albumin 2.9. Prealbumin 15.1>18.9. Last BM documented as PTA. - Meds: Dulcolax supp daily, PPI IV.  Endo: Home Syntrhoid. No hx of DM. CBGs with improved control (94-150 last 24h) On SSI, Synthroid. Running [id number] 61ml/hr due to hypernatremia Insulin requirements in the past 24 hours: 2 units SSI + 10 units Levemir/hs  Lytes: Na 150. K remains low 2.8>3.5 today. CoCa 10.58. Phos 2.9 ok today (4.8 on 10/13), Mg ok at 1.9  Renal: SCr elevated at 1.56 down, CrCl ~36ml/min. UOP good. I/O +815 (total -3.9L).  BUN up to 73  Pulm:RA  Cards: S/p mitral valve repair, maze procedure, and re-exploration of R thoracotomy for bleeding. Chest tube output 145 ml/24h. Vascular congestion-gentle diuresis.  - Meds: Atenolol, Clonidine patch, warfarin  Hepatobil: LFTs elevated super slowly trending down and Tbili down to 4.6 (max 9.4)  Neuro:  ID: Afebrile, WBC 20.6  Best Practices:  Lovenox 30/24h TPN Access: PICC double lumen >> TPN start date: 10/12 >>10/16  Nutritional Goals (per RD recommendation on 10/12): KCal: 1750-1900 Protein: 80-95 g Fluid: > 1.6 L/day  Current Nutrition:  Transitioned to dysphagia 1 diet ( po intake documented 10/14) Clinimix  Plan:  Patient trying to increase oral intake with diet + magic cup, Ensure Enlive. Spoke with Dr. Cornelius Moras who ok'd d/c TPN today. Current 74ml/hr is our lowest rate. No benefit vs cost if I decrease rate further. F/u toleration of diet reconsult pharmacy to resume TPN if needed. Pharmacy will sign off. Please reconsult for further dosing assitance.    Joni Norrod S. Merilynn Finland, PharmD, [facility] Clinical Staff Pharmacist Pager [phone]  04/23/2017 11:36 AM

## 2017-04-23 NOTE — Progress Notes (Signed)
TCTS BRIEF SICU PROGRESS NOTE  Looks good.  Just ambulated fairly well around SICU Still w/ some confusion and marginal po intake  Plan: Continue current plan  Purcell Nails, MD 04/23/2017 5:41 PM

## 2017-04-23 NOTE — Progress Notes (Signed)
  Speech Language Pathology Treatment: Dysphagia  Patient Details Name: Amanda [name] MRN: [id number] DOB: 08/27/1940 Today's Date: 04/23/2017 Time: 5797-2820 SLP Time Calculation (min) (ACUTE ONLY): 15 min  Assessment / Plan / Recommendation Clinical Impression  Pt seen for dysphagia intervention. She has an immediate and delayed throat clear with most trials of puree, nectar thick and thin water indicative of airway invasion versus from esophageal source (habitual from GERD?). Oral delays suspect cognitive based and delayed mastication with solid. Will need objective testing to fully assess and upgrade consistencies. Intake has been low.  Encouraged pt/daughter to allow pt to self feed. MBS planned for tomorrow   HPI HPI: 76 yo female with an extensive pmh including esophageal dysmotility and stricture, s/p balloon dilation (02/19/17), who underwent maze and minimal invasive mitral valve repair 10/9. Subsequently encephalopathic, hypoxic requiring bipap. 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 Referred for swallowing evaluation.      SLP Plan  MBS       Recommendations  Diet recommendations: Dysphagia 1 (puree);Nectar-thick liquid Liquids provided via: Cup;No straw Medication Administration: Whole meds with puree Supervision: Patient able to self feed;Full supervision/cueing for compensatory strategies Compensations: Slow rate;Small sips/bites Postural Changes and/or Swallow Maneuvers: Seated upright 90 degrees                Oral Care Recommendations: Oral care BID Follow up Recommendations: Inpatient Rehab SLP Visit Diagnosis: Dysphagia, unspecified (R13.10) Plan: MBS       GO                [name] 04/23/2017, 1:51 PM  [name] Amanda [name] M.Ed [employer] [phone]

## 2017-04-23 NOTE — Progress Notes (Addendum)
[address]       [name] [address]             [phone]        CARDIOTHORACIC SURGERY PROGRESS NOTE  R7Days Post-OpS/P Procedure(s) (LRB): MINIMALLY INVASIVE MITRAL VALVE REPAIR (MVR) (Right) MINIMALLY INVASIVE MAZE PROCEDURE (N/A) TRANSESOPHAGEAL ECHOCARDIOGRAM (TEE) (N/A) CLIPPING OF ATRIAL APPENDAGE using AtriCure Pro2 clip 45   R7 Days Post-Op Procedure(s) (LRB): RE-EXPLORATION RIGHT THORACOTOMY FOR BLEEDING (Right)  Subjective: Awake and alert.  Oriented to name.  Follows commands.  Reportedly more confused at night but doing well during the daytime  Objective: Vital signs: BP Readings from Last 1 Encounters:  04/23/17 131/75   Pulse Readings from Last 1 Encounters:  04/23/17 70   Resp Readings from Last 1 Encounters:  04/23/17 (!) 21   Temp Readings from Last 1 Encounters:  04/23/17 98 F (36.7 C) (Oral)    Hemodynamics:    Physical Exam:  Rhythm:   AV paced  Breath sounds: clear  Heart sounds:  RRR w/out murmur  Incisions:  Clean and dry  Abdomen:  Soft, non-distended, non-tender  Extremities:  Warm, well-perfused    Intake/Output from previous day: 10/15 0701 - 10/16 0700 In: 1470 [P.O.:120; I.V.:1200; IV Piggyback:150] Out: 655 [Urine:525; Chest Tube:130] Intake/Output this shift: No intake/output data recorded.  Lab Results:  CBC: Recent Labs  04/21/17 0644 04/22/17 0419  WBC 20.6* 20.6*  HGB 13.5 14.1  HCT 41.8 45.5  PLT 126* 84*    BMET:  Recent Labs  04/22/17 0419 04/23/17 0236  NA 152* 150*  K 2.8* 3.5  CL 101 105  CO2 39* 37*  GLUCOSE 154* 124*  BUN 73* 71*  CREATININE 1.71* 1.56*  CALCIUM 9.7 9.7     PT/INR:   Recent Labs  04/23/17 0236  LABPROT 14.9  INR 1.18    CBG (last 3)   Recent Labs  04/22/17 1237 04/22/17 1650 04/22/17 2032  GLUCAP 119* 104* 150*    ABG    Component Value Date/Time   PHART 7.520 (H) 04/19/2017 2132   PCO2ART 37.9 04/19/2017 2132   PO2ART 69.7 (L) 04/19/2017 2132   HCO3 30.8 (H) 04/19/2017 2132   TCO2 22 04/17/2017 1912   ACIDBASEDEF 1.3 04/18/2017 0505   O2SAT 94.5 04/19/2017 2132    CXR: PORTABLE CHEST 1 VIEW  COMPARISON:  04/21/2017.  FINDINGS: Right-sided chest tubes have been removed. Small right apical pneumothorax (5%). Suspect loculated inferior right lateral pneumothorax (approximately 10%). Right base atelectasis limits evaluation.  Minimal medial left base atelectasis.  Post valve replacement and atrial clipping. Sequential pacemaker in place. Cardiomegaly.  Right PICC line tip distal superior vena cava.  IMPRESSION: Right-sided chest tubes have been removed. Small right apical pneumothorax (5%). Suspect loculated inferior right lateral pneumothorax (approximately 10%). Right base atelectasis limits evaluation.  Minimal medial left base atelectasis.  Post valve replacement and atrial clipping. Sequential pacemaker in place. Cardiomegaly.  These results will be called to the ordering clinician or representative by the Radiologist Assistant, and communication documented in the PACS or zVision Dashboard.   Electronically Signed   By: Lacy Duverney M.D.   On: 04/23/2017 08:12  Assessment/Plan:  Overall stable POD7 Maintaining AV paced rhythm w/ stable BP Breathing comfortably w/ O2 sats 95% on RA Postop delirium resolving/resolved Elevated serum creatinine, likely due to prerenal azotemia +/- acute kidney injury caused by ATN Hypernatremia likely due to dehydration, weight now below preop baseline, trending down  Hypokalemia, induced by loop diuretics Leukocytosis w/out fever, likely reactive   Encourage oral diet  Wean TNA   Continue to hold diuretics  Stop Clonidine patch  Supplement potassium  Mobilize  PT  Purcell Nails, MD 04/23/2017 8:46 AM

## 2017-04-23 NOTE — Progress Notes (Signed)
Physical Therapy Treatment Patient Details Name: Amanda [name] MRN: [id number] DOB: 1941-03-17 Today's Date: 04/23/2017    History of Present Illness 76 yo female with an extensive pmh including esophageal dysmotility and stricture, s/p balloon dilation (02/19/17), who underwent maze and minimal invasive mitral valve repair 10/9. Subsequently encephalopathic, hypoxic requiring bipap. 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    PT Comments    Pt admitted with above diagnosis. Pt currently with functional limitations due to balance and endurance deficits. Pt was able to progress ambulation with Amanda [name] walker today.  Needed cues and assist of 2 persons with chair follow. Will continue to folllow.   Pt will benefit from skilled PT to increase their independence and safety with mobility to allow discharge to the venue listed below.     Follow Up Recommendations  CIR     Equipment Recommendations   (TBD)    Recommendations for Other Services Rehab consult     Precautions / Restrictions Precautions Precautions: Fall Restrictions Weight Bearing Restrictions: No Other Position/Activity Restrictions: sternal precautions    Mobility  Bed Mobility Overal bed mobility: Needs Assistance Bed Mobility: Sit to Supine       Sit to supine: Min assist   General bed mobility comments: assist for descending trunk into bed and LE management back into bed  Transfers Overall transfer level: Needs assistance Equipment used:  Amanda [name] walker) Transfers: Sit to/from Stand Sit to Stand: Min assist;+2 safety/equipment         General transfer comment:  required tactile cues at upper thoracic back to lean forward as well as cues to hug pillow.   Ambulation/Gait Ambulation/Gait assistance: Min assist;+2 physical assistance (3rd person for chair follow) Ambulation Distance (Feet): 175 Feet Assistive  device:  (EVa walker) Gait Pattern/deviations: Step-through pattern;Decreased stride length;Trunk flexed;Wide base of support;Drifts right/left     General Gait Details: Pt was able to use Amanda [name] wlaker with constant cues to stay close toEva walker as well as cue for upright posture.  Pt needed contant cues to sequence steps and Amanda [name] walker. Does well with encouragament.  3 rd person for chair follow.    Stairs            Wheelchair Mobility    Modified Rankin (Stroke Patients Only)       Balance Overall balance assessment: Needs assistance         Standing balance support: Bilateral upper extremity supported Standing balance-Leahy Scale: Poor Standing balance comment: Needed UE support                            Cognition Arousal/Alertness: Awake/alert Behavior During Therapy: Flat affect Overall Cognitive Status: Impaired/Different from baseline Area of Impairment: Attention;Safety/judgement;Problem solving                   Current Attention Level: Focused     Safety/Judgement: Decreased awareness of safety;Decreased awareness of deficits   Problem Solving: Slow processing;Decreased initiation;Difficulty sequencing;Requires verbal cues;Requires tactile cues        Exercises      General Comments        Pertinent Vitals/Pain Pain Assessment: Faces Faces Pain Scale: Hurts little more Pain Location: R side Pain Descriptors / Indicators: Constant Pain Intervention(s): Limited activity within patient's tolerance;Monitored during session;Repositioned;Premedicated before session  VSS  Home Living  Prior Function            PT Goals (current goals can now be found in the care plan section) Acute Rehab PT Goals Patient Stated Goal: didn't state Progress towards PT goals: Progressing toward goals    Frequency    Min 3X/week      PT Plan Current plan remains appropriate    Co-evaluation               AM-PAC PT "6 Clicks" Daily Activity  Outcome Measure  Difficulty turning over in bed (including adjusting bedclothes, sheets and blankets)?: Unable Difficulty moving from lying on back to sitting on the side of the bed? : Unable Difficulty sitting down on and standing up from a chair with arms (e.g., wheelchair, bedside commode, etc,.)?: A Lot Help needed moving to and from a bed to chair (including a wheelchair)?: A Lot Help needed walking in hospital room?: A Lot Help needed climbing 3-5 steps with a railing? : Total 6 Click Score: 9    End of Session Equipment Utilized During Treatment: Gait belt Activity Tolerance: Patient tolerated treatment well;Patient limited by fatigue Patient left: with call bell/phone within reach;in bed Nurse Communication: Mobility status PT Visit Diagnosis: Unsteadiness on feet (R26.81);Muscle weakness (generalized) (M62.81);Difficulty in walking, not elsewhere classified (R26.2)     Time: 4944-9675 PT Time Calculation (min) (ACUTE ONLY): 15 min  Charges:  $Gait Training: 8-22 mins                    G Codes:       Amanda [name],PT Acute Rehabilitation [phone] (pager)    Amanda [name] 04/23/2017, 11:27 AM

## 2017-04-24 ENCOUNTER — Inpatient Hospital Stay (HOSPITAL_COMMUNITY): Payer: Medicare Other

## 2017-04-24 DIAGNOSIS — R7309 Other abnormal glucose: Secondary | ICD-10-CM

## 2017-04-24 DIAGNOSIS — I10 Essential (primary) hypertension: Secondary | ICD-10-CM

## 2017-04-24 DIAGNOSIS — Z8679 Personal history of other diseases of the circulatory system: Secondary | ICD-10-CM

## 2017-04-24 DIAGNOSIS — D72829 Elevated white blood cell count, unspecified: Secondary | ICD-10-CM

## 2017-04-24 DIAGNOSIS — D696 Thrombocytopenia, unspecified: Secondary | ICD-10-CM

## 2017-04-24 DIAGNOSIS — Z8673 Personal history of transient ischemic attack (TIA), and cerebral infarction without residual deficits: Secondary | ICD-10-CM

## 2017-04-24 DIAGNOSIS — Z950 Presence of cardiac pacemaker: Secondary | ICD-10-CM

## 2017-04-24 DIAGNOSIS — G8918 Other acute postprocedural pain: Secondary | ICD-10-CM

## 2017-04-24 DIAGNOSIS — E870 Hyperosmolality and hypernatremia: Secondary | ICD-10-CM

## 2017-04-24 DIAGNOSIS — N183 Chronic kidney disease, stage 3 (moderate): Secondary | ICD-10-CM

## 2017-04-24 DIAGNOSIS — I480 Paroxysmal atrial fibrillation: Secondary | ICD-10-CM

## 2017-04-24 LAB — GLUCOSE, CAPILLARY
GLUCOSE-CAPILLARY: 64 mg/dL — AB (ref 65–99)
GLUCOSE-CAPILLARY: 96 mg/dL (ref 65–99)
GLUCOSE-CAPILLARY: 173 mg/dL — AB (ref 65–99)
GLUCOSE-CAPILLARY: 79 mg/dL (ref 65–99)
Glucose-Capillary: 101 mg/dL — ABNORMAL HIGH (ref 65–99)

## 2017-04-24 LAB — CBC
HCT: 46.4 % — ABNORMAL HIGH (ref 36.0–46.0)
Hemoglobin: 14.9 g/dL (ref 12.0–15.0)
MCH: 30.2 pg (ref 26.0–34.0)
MCHC: 32.1 g/dL (ref 30.0–36.0)
MCV: 93.9 fL (ref 78.0–100.0)
PLATELETS: 68 10*3/uL — AB (ref 150–400)
RBC: 4.94 MIL/uL (ref 3.87–5.11)
RDW: 17.6 % — AB (ref 11.5–15.5)
WBC: 21.6 10*3/uL — ABNORMAL HIGH (ref 4.0–10.5)

## 2017-04-24 LAB — BASIC METABOLIC PANEL
Anion gap: 13 (ref 5–15)
BUN: 77 mg/dL — AB (ref 6–20)
CHLORIDE: 108 mmol/L (ref 101–111)
CO2: 28 mmol/L (ref 22–32)
CREATININE: 1.53 mg/dL — AB (ref 0.44–1.00)
Calcium: 9.5 mg/dL (ref 8.9–10.3)
GFR calc Af Amer: 36 mL/min — ABNORMAL LOW (ref >60)
GFR calc non Af Amer: 31 mL/min — ABNORMAL LOW (ref >60)
GLUCOSE: 99 mg/dL (ref 65–99)
Potassium: 4.2 mmol/L (ref 3.5–5.1)
Sodium: 149 mmol/L — ABNORMAL HIGH (ref 135–145)

## 2017-04-24 LAB — PROTIME-INR
INR: 1.15
Prothrombin Time: 14.6 seconds (ref 11.4–15.2)

## 2017-04-24 MED ORDER — QUETIAPINE FUMARATE 50 MG PO TABS
50.0000 mg | ORAL_TABLET | Freq: Every day | ORAL | Status: DC
  Administered 2017-04-24 – 2017-04-25 (×2): 50 mg via ORAL
  Filled 2017-04-24 (×2): qty 1

## 2017-04-24 NOTE — Progress Notes (Signed)
Hypoglycemic Event  CBG:64   Treatment: Orange Juice with thicker and sugar   Symptoms: None noted. Baseline confusion  Follow-up CBG: Time:0820 CBG Result:94  Possible Reasons for Event: Poor PO intake   Comments/MD notified:Elink- no new orders     Dorma Russell

## 2017-04-24 NOTE — Progress Notes (Signed)
Patient ID: Amanda [name], female   DOB: 1941/03/15, 76 y.o.   MRN: [id number] EVENING ROUNDS NOTE :     [address]       [place] [address]             [phone]                 8 Days Post-Op Procedure(s) (LRB): RE-EXPLORATION RIGHT THORACOTOMY FOR BLEEDING (Right)  Total Length of Stay:  LOS: 8 days  BP (!) 108/55   Pulse 70   Temp 97.9 F (36.6 C) (Oral)   Resp (!) 26   Ht 5' 6.5" (1.689 m)   Wt 135 lb 9.6 oz (61.5 kg)   SpO2 95%   BMI 21.56 kg/m   .Intake/Output      10/17 0701 - 10/18 0700       Urine Occurrence 4 x   Stool Occurrence 3 x     . sodium chloride    . dextrose 10 mL/hr at 04/23/17 1700  . lactated ringers       Lab Results  Component Value Date   WBC 21.6 (H) 04/24/2017   HGB 14.9 04/24/2017   HCT 46.4 (H) 04/24/2017   PLT 68 (L) 04/24/2017   GLUCOSE 99 04/24/2017   CHOL 153 01/07/2015   TRIG 197 (H) 04/22/2017   HDL 51 01/07/2015   LDLCALC 89 01/07/2015   ALT 105 (H) 04/22/2017   AST 85 (H) 04/22/2017   NA 149 (H) 04/24/2017   K 4.2 04/24/2017   CL 108 04/24/2017   CREATININE 1.53 (H) 04/24/2017   BUN 77 (H) 04/24/2017   CO2 28 04/24/2017   TSH 1.488 04/19/2017   INR 1.15 04/24/2017   HGBA1C 5.8 (H) 04/12/2017   Stable day Lab Results  Component Value Date   INR 1.15 04/24/2017   INR 1.18 04/23/2017   INR 0.87 04/16/2017     Delight Ovens MD  Beeper [phone] Office [phone] 04/24/2017 7:45 PM

## 2017-04-24 NOTE — Progress Notes (Signed)
Modified Barium Swallow Progress Note  Patient Details  Name: Amanda [name] MRN: [id number] Date of Birth: September 09, 1940  Today's Date: 04/24/2017  Modified Barium Swallow completed.  Full report located under Chart Review in the Imaging Section.  Brief recommendations include the following:  Clinical Impression  Pt exhibited significant anxiety during MBS however redirectable with verbal/tactile encouragement. Study was limited in amount of thin trials observed. Orally, pt held thin bolus with cues needed for swallow (suspect due to anxiety) with anterior spill using straw. Piecemeal swallows with significant lingual residue spilling to pyriforms and sitting for 15-20 seconds again likely impacted by internal distractions. Laryngeal protection and motor tract was intact throughout study without encrouchment of barium into laryngeal vestibule. No significant pharyngeal residue. MBS does not diagnose below the level of the UES however barium stasis noted in the distal esophagus. Consistent throat clear is suspected from primary esophageal source. Recommend upgrade liquids to thin and diet to Dys 3 (mech soft), check oral cavity for pocketed food, full supervision and meds whole in applesauce and reflux precautions. Pt may need texture downgrade if pocketing. ST to continue.    Swallow Evaluation Recommendations       SLP Diet Recommendations: Dysphagia 3 (Mech soft) solids;Thin liquid   Liquid Administration via: Cup;Straw   Medication Administration: Whole meds with puree   Supervision: Patient able to self feed;Full supervision/cueing for compensatory strategies   Compensations: Slow rate;Small sips/bites   Postural Changes: Remain semi-upright after after feeds/meals (Comment);Seated upright at 90 degrees   Oral Care Recommendations: Oral care BID        [name] 04/24/2017,11:48 AM   [name] Lonell Face.Ed [employer] [phone]

## 2017-04-24 NOTE — Progress Notes (Signed)
NUTRITION NOTE  Pt seen by RD for follow-up on 10/15 with associated note at 1:55 PM. TPN had been initiated on 10/12 with pt continued on oral diet: Dysphagia 1 with nectar-thick liquids. Ensure Enlive was ordered BID and Magic Cup was ordered TID. She was seen by SLP yesterday afternoon. Pt remains on the same diet with these supplement orders in place. No documented intakes since that time.   Reviewed the chart. TPN was stopped yesterday and remains off at this time.  RD will continue to follow per protocol. Continue to encourage PO intakes of meals and supplements. Dr. Orvan July note from this AM states that post-op delirium is improving; hopeful that as this continues to resolve intakes will improve.    Trenton Gammon, MS, RD, LDN, [facility] Inpatient Clinical Dietitian Pager # [phone] After hours/weekend pager # [phone]

## 2017-04-24 NOTE — Progress Notes (Signed)
Rehab admissions - I am following for potential acute inpatient rehab admission.  OT evaluation is pending.  Will need updated PT notes and OT eval to be able to determine if patient needs CIR.  Also, will need authorization from [facility] medicare if patient has rehab needs.  Will follow up in am.  Call me for questions.  #[phone]

## 2017-04-24 NOTE — Progress Notes (Addendum)
[address]       [place] [id number]             [phone]        CARDIOTHORACIC SURGERY PROGRESS NOTE  R8Days Post-OpS/P Procedure(s) (LRB): MINIMALLY INVASIVE MITRAL VALVE REPAIR (MVR) (Right) MINIMALLY INVASIVE MAZE PROCEDURE (N/A) TRANSESOPHAGEAL ECHOCARDIOGRAM (TEE) (N/A) CLIPPING OF ATRIAL APPENDAGE using AtriCure Pro2 clip 45   R8 Days Post-Op Procedure(s) (LRB): RE-EXPLORATION RIGHT THORACOTOMY FOR BLEEDING (Right)  Subjective: Alert and oriented to name and place.  Still intermittently confused.  Reportedly didn't sleep last night  Objective: Vital signs: BP Readings from Last 1 Encounters:  04/24/17 119/73   Pulse Readings from Last 1 Encounters:  04/23/17 70   Resp Readings from Last 1 Encounters:  04/24/17 20   Temp Readings from Last 1 Encounters:  04/24/17 97.6 F (36.4 C) (Oral)    Hemodynamics:    Physical Exam:  Rhythm:   AV paced  Breath sounds: clear  Heart sounds:  RRR  Incisions:  Clean and dry  Abdomen:  Soft, non-distended, non-tender  Extremities:  Warm, well-perfused    Intake/Output from previous day: 10/16 0701 - 10/17 0700 In: 810 [I.V.:660; IV Piggyback:150] Out: -  Intake/Output this shift: No intake/output data recorded.  Lab Results:  CBC: Recent Labs  04/22/17 0419 04/24/17 0212  WBC 20.6* 21.6*  HGB 14.1 14.9  HCT 45.5 46.4*  PLT 84* 68*    BMET:  Recent Labs  04/23/17 0236 04/24/17 0212  NA 150* 149*  K 3.5 4.2  CL 105 108  CO2 37* 28  GLUCOSE 124* 99  BUN 71* 77*  CREATININE 1.56* 1.53*  CALCIUM 9.7 9.5     PT/INR:   Recent Labs  04/24/17 0212  LABPROT 14.6  INR 1.15    CBG (last 3)   Recent Labs  04/23/17 1615 04/23/17 2006 04/24/17 0740  GLUCAP 106* 82 64*    ABG    Component Value Date/Time   PHART 7.520 (H) 04/19/2017 2132   PCO2ART 37.9 04/19/2017 2132   PO2ART 69.7 (L) 04/19/2017 2132   HCO3 30.8 (H) 04/19/2017 2132   TCO2 22  04/17/2017 1912   ACIDBASEDEF 1.3 04/18/2017 0505   O2SAT 94.5 04/19/2017 2132    CXR: PORTABLE CHEST 1 VIEW  COMPARISON:  Portable chest x-ray of April 23, 2017  FINDINGS: There remains an approximately 5-10% right-sided pneumothorax. A pleural line is visible in the apex and inferiorly and laterally. There is right basilar subsegmental atelectasis. There is a small right pleural effusion. The left lung is clear. The cardiac silhouette is enlarged. The left atrial appendage clip and the prosthetic mitral valve ring are in stable position. The pulmonary vascularity is normal. There is calcification in the wall of the aortic arch. The ICD is in stable position.  IMPRESSION: Slight interval decrease in conspicuity of the right apical and inferolateral pneumothorax. Persistent right basilar atelectasis and probable small right pleural effusion. Stable cardiomegaly without pulmonary edema.  Thoracic aortic atherosclerosis.   Electronically Signed   By: David  SwazilandJordan M.D.   On: 04/24/2017 08:04   Assessment/Plan:  Overall stable POD8 Maintaining AV paced rhythm w/ stable BP Breathing comfortably w/ O2 sats 95% on RA Postop delirium resolving Elevated serum creatinine, likely due to prerenal azotemia +/- acute kidney injury caused by ATN Hypernatremia likely due to dehydration, weight now below preop baseline, trending down Leukocytosis w/out fever, likely reactive but remains elevated   Encourage oral  diet  Follow up swallowing function  Continue to hold diuretics  Mobilize  PT   Purcell Nails, MD 04/24/2017 9:15 AM

## 2017-04-24 NOTE — Consult Note (Signed)
Physical Medicine and Rehabilitation Consult Reason for Consult: Decreased functional mobility Referring Physician: Dr. Cornelius Moras   HPI: Amanda [name] is a 76 y.o. right handed female with history of mitral valve prolapse and mitral regurgitation, CVA, hypertension, stage III CKD, heart block with pacemaker, PAF on long-term anticoagulation. Patient lives with spouse. Reportedly independent and driving prior to admission. Per chart review, husband, and daughter, pt presented 04/15/2017 for evaluation and treatment of symptomatic mitral regurgitation. Recent echocardiogram with normal left ventricular systolic function mild to moderate mitral regurgitation. Follow-up echo showed significant worsening in severity of mitral regurgitation. Underwent Maze procedure 04/16/2017 per Dr. Cornelius Moras. Hospital course pain management. Sternal precautions as directed. Hospital course leukocytosis 21,600 and monitored. Intermittent bouts of confusion and restlessness. Cranial CT scan reviewed, unremarkable for acute process. Subcutaneous Lovenox for DVT prophylaxis. Physical therapy evaluation completed with recommendations of physical medicine rehabilitation consult.  Review of Systems  Unable to perform ROS: Acuity of condition   Past Medical History:  Diagnosis Date  . Allergic rhinitis   . Anemia   . CKD (chronic kidney disease), stage III (HCC)    stage III  . DJD (degenerative joint disease)   . Glomerulonephritis    Dr [name]  . Hyperlipidemia   . Hyperparathyroidism (HCC)   . Hypertension   . Hypothyroidism   . IBS (irritable bowel syndrome)   . Interstitial cystitis   . Mitral regurgitation   . Mobitz II    a. s/p STJ dual chamber PPM   . MVP (mitral valve prolapse)    moderate posterior MVP with moderate MR and grade II diasotlic dysfunction  . PAC (premature atrial contraction)   . PAF (paroxysmal atrial fibrillation) (HCC)     note on pacer check. CHADS2VASC score is 4 now on  Eliquis.  . Paroxysmal atrial fibrillation (HCC)    CHADS2VASC score is 4  . PVC's (premature ventricular contractions)   . S/P minimally invasive maze operation for atrial fibrillation 04/16/2017   Complete bilateral atrial lesion set using cryothermy and bipolar radiofrequency ablation with clipping of LA appendage via right mini thoracotomy approach  . S/P minimally invasive mitral valve repair 04/16/2017   Complex valvuloplasty including triangular resection of posterior leaflet, artificial Gore-tex neochords x6 and Sorin Memo 3D ring annuloplasty ([id number], size 32, serial # [id number])  . S/P placement of cardiac pacemaker   . Small vessel disease, cerebrovascular    Past Surgical History:  Procedure Laterality Date  . ABDOMINAL HYSTERECTOMY    . BREAST BIOPSY Right   . CLIPPING OF ATRIAL APPENDAGE  04/16/2017   Procedure: CLIPPING OF ATRIAL APPENDAGE using AtriCure Pro2 clip 45;  Surgeon: Purcell Nails, MD;  Location: [facility] OR;  Service: Open Heart Surgery;;  . CYSTOSTOMY W/ BLADDER BIOPSY    . EP IMPLANTABLE DEVICE N/A 01/07/2015   STJ dual chamber pacemaker implanted by Dr [name] for 2:1 heart block  . MINIMALLY INVASIVE MAZE PROCEDURE N/A 04/16/2017   Procedure: MINIMALLY INVASIVE MAZE PROCEDURE;  Surgeon: Purcell Nails, MD;  Location: [facility] OR;  Service: Open Heart Surgery;  Laterality: N/A;  . MITRAL VALVE REPAIR Right 04/16/2017   Procedure: MINIMALLY INVASIVE MITRAL VALVE REPAIR (MVR);  Surgeon: Purcell Nails, MD;  Location: [facility] OR;  Service: Open Heart Surgery;  Laterality: Right;  . MITRAL VALVE REPAIR Right 04/16/2017   Procedure: RE-EXPLORATION RIGHT THORACOTOMY FOR BLEEDING;  Surgeon: Purcell Nails, MD;  Location: [facility] OR;  Service: Open Heart Surgery;  Laterality:  Right;  [name] PERIPHERAL VASCULAR CATHETERIZATION N/A 02/03/2015   Procedure: Upper Extremity Venography;  Surgeon: [name], MD;  Location: [facility] INVASIVE CV LAB;  Service: Cardiovascular;  Laterality: N/A;  . RIGHT/LEFT  HEART CATH AND CORONARY ANGIOGRAPHY N/A 03/26/2017   Procedure: RIGHT/LEFT HEART CATH AND CORONARY ANGIOGRAPHY;  Surgeon: [name], MD;  Location: [facility] INVASIVE CV LAB;  Service: Cardiovascular;  Laterality: N/A;  . TEE WITHOUT CARDIOVERSION N/A 03/18/2017   Procedure: TRANSESOPHAGEAL ECHOCARDIOGRAM (TEE);  Surgeon: [name], MD;  Location: [facility] ENDOSCOPY;  Service: Cardiovascular;  Laterality: N/A;  . TEE WITHOUT CARDIOVERSION N/A 04/16/2017   Procedure: TRANSESOPHAGEAL ECHOCARDIOGRAM (TEE);  Surgeon: Purcell Nails, MD;  Location: [facility] OR;  Service: Open Heart Surgery;  Laterality: N/A;  . TONSILLECTOMY     Family History  Problem Relation Age of Onset  . Heart disease Mother   . Stroke Mother   . Hypertension Mother   . Heart disease Father   . Heart attack Father   . Ovarian cancer Sister   . Heart disease Brother   . Rheum arthritis Sister   . Melanoma Brother   . Brain cancer Brother   . Heart attack Brother   . Hypertension Brother   . Hypertension Sister   . Colon cancer Neg Hx    Social History:  reports that she has quit smoking. She has never used smokeless tobacco. She reports that she does not drink alcohol or use drugs. Allergies:  Allergies  Allergen Reactions  . Pepcid [Famotidine] Swelling and Other (See Comments)    "Throat Swelling"   . Allopurinol Other (See Comments)    Caused headaches  . Amlodipine Other (See Comments)    PEDAL EDEMA   . Colchicine Other (See Comments)    UNSPECIFIED REACTION   . Contrast Media [Iodinated Diagnostic Agents] Hives    IVP dye per patient  . Prilosec [Omeprazole] Nausea Only  . Atorvastatin Other (See Comments)    UNSPECIFIED REACTION   . Cephalexin Itching and Rash  . Ciprofloxacin Itching and Rash  . Erythromycin Itching and Rash  . Sulfonamide Derivatives Itching and Rash  . Tetracycline Itching and Rash   Facility-Administered Medications Prior to Admission  Medication Dose Route Frequency Provider  Last Rate Last Dose  . 0.9 %  sodium chloride infusion  500 mL Intravenous Continuous Armbruster, Willaim Rayas, MD       Medications Prior to Admission  Medication Sig Dispense Refill  . apixaban (ELIQUIS) 5 MG TABS tablet Take 1 tablet (5 mg total) by mouth 2 (two) times daily. (Patient not taking: Reported on 04/15/2017) 60 tablet 11  . atenolol (TENORMIN) 50 MG tablet Take 1 tablet (50 mg total) by mouth daily. (Patient taking differently: Take 50 mg by mouth 2 (two) times daily. ) 30 tablet 11  . conjugated estrogens (PREMARIN) vaginal cream Place 1 Applicatorful vaginally every 3 (three) days.     . fluticasone (FLONASE) 50 MCG/ACT nasal spray Place 2 sprays into both nostrils daily.    . furosemide (LASIX) 20 MG tablet Take 1 tablet (20 mg total) by mouth daily. 90 tablet 3  . levocetirizine (XYZAL) 5 MG tablet Take 5 mg by mouth every evening.    [name] levothyroxine (SYNTHROID, LEVOTHROID) 75 MCG tablet Take 75 mcg by mouth See admin instructions. Take 75 mcg by mouth daily around 0300    . naproxen sodium (ANAPROX) 220 MG tablet Take 220 mg by mouth 2 (two) times daily as needed (for pain).     [name]  NON FORMULARY 1 each by Other route See admin instructions. Allergy injections once weekly    . Probiotic Product (PROBIOTIC DAILY PO) Take 1 tablet by mouth daily.    . hydrocortisone valerate ointment (WEST-CORT) 0.2 % Apply 1 application topically 2 (two) times daily as needed (for itching).       Home: Home Living Family/patient expects to be discharged to:: Inpatient rehab Additional Comments: pt was living at home with spouse in 1 story home with 3 STE, walk in shower with seat in it  Functional History: Prior Function Level of Independence: Independent Comments: driving, dressing, bathing, walking without AD Functional Status:  Mobility: Bed Mobility Overal bed mobility: Needs Assistance Bed Mobility: Sit to Supine Sit to supine: Min assist General bed mobility comments: assist for  descending trunk into bed and LE management back into bed Transfers Overall transfer level: Needs assistance Equipment used:  [name] walker) Transfers: Sit to/from Stand Sit to Stand: Min assist, +2 safety/equipment Stand pivot transfers: Max assist, +2 safety/equipment General transfer comment:  required tactile cues at upper thoracic back to lean forward as well as cues to hug pillow.  Ambulation/Gait Ambulation/Gait assistance: Min assist, +2 physical assistance (3rd person for chair follow) Ambulation Distance (Feet): 175 Feet Assistive device:  (EVa walker) Gait Pattern/deviations: Step-through pattern, Decreased stride length, Trunk flexed, Wide base of support, Drifts right/left General Gait Details: Pt was able to use [name] wlaker with constant cues to stay close toEva walker as well as cue for upright posture.  Pt needed contant cues to sequence steps and [name] walker. Does well with encouragament.  3 rd person for chair follow.     ADL:    Cognition: Cognition Overall Cognitive Status: Impaired/Different from baseline Orientation Level: Oriented to person, Disoriented to place, Disoriented to time, Disoriented to situation Cognition Arousal/Alertness: Awake/alert Behavior During Therapy: Flat affect Overall Cognitive Status: Impaired/Different from baseline Area of Impairment: Attention, Safety/judgement, Problem solving Current Attention Level: Focused Safety/Judgement: Decreased awareness of safety, Decreased awareness of deficits Problem Solving: Slow processing, Decreased initiation, Difficulty sequencing, Requires verbal cues, Requires tactile cues General Comments: pt with lethargy/fatigue and had difficulty follow commands during MMT  Blood pressure 119/73, pulse 70, temperature 97.6 F (36.4 C), temperature source Oral, resp. rate 20, height 5' 6.5" (1.689 m), weight 61.5 kg (135 lb 9.6 oz), SpO2 95 %. Physical Exam  Vitals reviewed. Constitutional: She appears  well-developed and well-nourished.  HENT:  Head: Normocephalic and atraumatic.  Eyes: EOM are normal. Right eye exhibits no discharge. Left eye exhibits no discharge.  Neck: Normal range of motion. Neck supple. No thyromegaly present.  Cardiovascular:  Cardiac rate controlled  Respiratory: Effort normal and breath sounds normal. No respiratory distress.  GI: Soft. Bowel sounds are normal. She exhibits no distension.  Musculoskeletal: She exhibits no edema or tenderness.  Neurological: She is alert.  Easily distracted.  Pleasantly confused.  She does provide her name and her husband's name. Very limited awareness of her deficits.  Followed simple commands Motor: 4+/5 grossly throughout  Skin: Skin is warm and dry.  Psychiatric: Her affect is blunt. Her speech is delayed. She is slowed. Cognition and memory are impaired.    Results for orders placed or performed during the hospital encounter of 04/16/17 (from the past 24 hour(s))  Glucose, capillary     Status: None   Collection Time: 04/23/17 11:59 AM  Result Value Ref Range   Glucose-Capillary 94 65 - 99 mg/dL  Glucose, capillary  Status: Abnormal   Collection Time: 04/23/17  4:15 PM  Result Value Ref Range   Glucose-Capillary 106 (H) 65 - 99 mg/dL   Comment 1 Capillary Specimen   Glucose, capillary     Status: None   Collection Time: 04/23/17  8:06 PM  Result Value Ref Range   Glucose-Capillary 82 65 - 99 mg/dL  Protime-INR     Status: None   Collection Time: 04/24/17  2:12 AM  Result Value Ref Range   Prothrombin Time 14.6 11.4 - 15.2 seconds   INR 1.15   Basic metabolic panel     Status: Abnormal   Collection Time: 04/24/17  2:12 AM  Result Value Ref Range   Sodium 149 (H) 135 - 145 mmol/L   Potassium 4.2 3.5 - 5.1 mmol/L   Chloride 108 101 - 111 mmol/L   CO2 28 22 - 32 mmol/L   Glucose, Bld 99 65 - 99 mg/dL   BUN 77 (H) 6 - 20 mg/dL   Creatinine, Ser 3.241.53 (H) 0.44 - 1.00 mg/dL   Calcium 9.5 8.9 - 40.110.3 mg/dL    GFR calc non Af Amer 31 (L) >60 mL/min   GFR calc Af Amer 36 (L) >60 mL/min   Anion gap 13 5 - 15  CBC     Status: Abnormal   Collection Time: 04/24/17  2:12 AM  Result Value Ref Range   WBC 21.6 (H) 4.0 - 10.5 K/uL   RBC 4.94 3.87 - 5.11 MIL/uL   Hemoglobin 14.9 12.0 - 15.0 g/dL   HCT 02.746.4 (H) 25.336.0 - 66.446.0 %   MCV 93.9 78.0 - 100.0 fL   MCH 30.2 26.0 - 34.0 pg   MCHC 32.1 30.0 - 36.0 g/dL   RDW 40.317.6 (H) 47.411.5 - 25.915.5 %   Platelets 68 (L) 150 - 400 K/uL  Glucose, capillary     Status: Abnormal   Collection Time: 04/24/17  7:40 AM  Result Value Ref Range   Glucose-Capillary 64 (L) 65 - 99 mg/dL   Comment 1 Capillary Specimen    Comment 2 Notify RN    Dg Chest Port 1 View  Result Date: 04/24/2017 CLINICAL DATA:  Follow-up right-sided pneumothorax EXAM: PORTABLE CHEST 1 VIEW COMPARISON:  Portable chest x-ray of April 23, 2017 FINDINGS: There remains an approximately 5-10% right-sided pneumothorax. A pleural line is visible in the apex and inferiorly and laterally. There is right basilar subsegmental atelectasis. There is a small right pleural effusion. The left lung is clear. The cardiac silhouette is enlarged. The left atrial appendage clip and the prosthetic mitral valve ring are in stable position. The pulmonary vascularity is normal. There is calcification in the wall of the aortic arch. The ICD is in stable position. IMPRESSION: Slight interval decrease in conspicuity of the right apical and inferolateral pneumothorax. Persistent right basilar atelectasis and probable small right pleural effusion. Stable cardiomegaly without pulmonary edema. Thoracic aortic atherosclerosis. Electronically Signed   By: David  SwazilandJordan M.D.   On: 04/24/2017 08:04   Dg Chest Port 1 View  Result Date: 04/23/2017 CLINICAL DATA:  76 year old female with shortness breath. Subsequent encounter. EXAM: PORTABLE CHEST 1 VIEW COMPARISON:  04/21/2017. FINDINGS: Right-sided chest tubes have been removed. Small right  apical pneumothorax (5%). Suspect loculated inferior right lateral pneumothorax (approximately 10%). Right base atelectasis limits evaluation. Minimal medial left base atelectasis. Post valve replacement and atrial clipping. Sequential pacemaker in place. Cardiomegaly. Right PICC line tip distal superior vena cava. IMPRESSION: Right-sided chest tubes have been  removed. Small right apical pneumothorax (5%). Suspect loculated inferior right lateral pneumothorax (approximately 10%). Right base atelectasis limits evaluation. Minimal medial left base atelectasis. Post valve replacement and atrial clipping. Sequential pacemaker in place. Cardiomegaly. These results will be called to the ordering clinician or representative by the Radiologist Assistant, and communication documented in the PACS or zVision Dashboard. Electronically Signed   By: Lacy Duverney M.D.   On: 04/23/2017 08:12    Assessment/Plan: Diagnosis: Debility Labs and images independently reviewed.  Records reviewed and summated above.  1. Does the need for close, 24 hr/day medical supervision in concert with the patient's rehab needs make it unreasonable for this patient to be served in a less intensive setting? Potentially 2. Co-Morbidities requiring supervision/potential complications: mitral valve prolapse and mitral regurgitation (Monitor in accordance with increased physical activity and avoid UE resistance excercises), CVA, HTN (monitor and provide prns in accordance with increased physical exertion and pain), stage III CKD (avoid nephrotoxic meds), heart block with pacemaker, PAF (monitor HR with increased mobility, cont meds), post-op pain management (Biofeedback training with therapies to help reduce reliance on opiate pain medications, monitor pain control during therapies, and sedation at rest and titrate to maximum efficacy to ensure participation and gains in therapies), leukocytosis (cont to monitor for signs and symptoms of infection,  further workup if indicated), Labile blood glucose (Monitor in accordance with exercise and adjust meds as necessary), hypernatremia (cont to monitor, treat if necessary), thrombocytopenia (trending down, < 60,000/mm3 no resistive exercise) 3. Due to bladder management, safety, disease management, pain management and patient education, does the patient require 24 hr/day rehab nursing? Potentially 4. Does the patient require coordinated care of a physician, rehab nurse, PT (1-2 hrs/day, 5 days/week) and OT (1-2 hrs/day, 5 days/week) to address physical and functional deficits in the context of the above medical diagnosis(es)? Potentially Addressing deficits in the following areas: balance, endurance, locomotion, strength, transferring, bathing, dressing, toileting, cognition and psychosocial support 5. Can the patient actively participate in an intensive therapy program of at least 3 hrs of therapy per day at least 5 days per week? Yes 6. The potential for patient to make measurable gains while on inpatient rehab is excellent 7. Anticipated functional outcomes upon discharge from inpatient rehab are supervision  with PT, supervision with OT, modified independent and supervision with SLP. 8. Estimated rehab length of stay to reach the above functional goals is: 6-10 days. 9. Anticipated D/C setting: Home 10. Anticipated post D/C treatments: HH therapy and Home excercise program 11. Overall Rehab/Functional Prognosis: excellent  RECOMMENDATIONS: This patient's condition is appropriate for continued rehabilitative care in the following setting: Request reeval by therapies; patient making signficant gains with limitations likely stemming from cognition. Will consider CIR if deficits persist as cognition improves once medically stable. Patient has agreed to participate in recommended program. Potentially Note that insurance prior authorization may be required for reimbursement for recommended  care.  Comment: Rehab Admissions Coordinator to follow up.  Maryla Morrow, MD, ABPMR Mariam Dollar J., PA-C 04/24/2017

## 2017-04-25 LAB — GLUCOSE, CAPILLARY
GLUCOSE-CAPILLARY: 141 mg/dL — AB (ref 65–99)
Glucose-Capillary: 90 mg/dL (ref 65–99)
Glucose-Capillary: 121 mg/dL — ABNORMAL HIGH (ref 65–99)
Glucose-Capillary: 104 mg/dL — ABNORMAL HIGH (ref 65–99)

## 2017-04-25 LAB — BASIC METABOLIC PANEL
Anion gap: 9 (ref 5–15)
BUN: 77 mg/dL — AB (ref 6–20)
CALCIUM: 9 mg/dL (ref 8.9–10.3)
CO2: 33 mmol/L — ABNORMAL HIGH (ref 22–32)
Chloride: 109 mmol/L (ref 101–111)
Creatinine, Ser: 1.74 mg/dL — ABNORMAL HIGH (ref 0.44–1.00)
GFR calc Af Amer: 31 mL/min — ABNORMAL LOW (ref >60)
GFR, EST NON AFRICAN AMERICAN: 27 mL/min — AB (ref >60)
Glucose, Bld: 121 mg/dL — ABNORMAL HIGH (ref 65–99)
Potassium: 3 mmol/L — ABNORMAL LOW (ref 3.5–5.1)
Sodium: 151 mmol/L — ABNORMAL HIGH (ref 135–145)

## 2017-04-25 LAB — CBC
HCT: 39.1 % (ref 36.0–46.0)
Hemoglobin: 12.1 g/dL (ref 12.0–15.0)
MCH: 28.9 pg (ref 26.0–34.0)
MCHC: 30.9 g/dL (ref 30.0–36.0)
MCV: 93.3 fL (ref 78.0–100.0)
PLATELETS: 85 10*3/uL — AB (ref 150–400)
RBC: 4.19 MIL/uL (ref 3.87–5.11)
RDW: 17.4 % — AB (ref 11.5–15.5)
WBC: 17.2 10*3/uL — AB (ref 4.0–10.5)

## 2017-04-25 LAB — PROTIME-INR
INR: 1.69
PROTHROMBIN TIME: 19.7 s — AB (ref 11.4–15.2)

## 2017-04-25 MED ORDER — PANTOPRAZOLE SODIUM 40 MG PO TBEC
40.0000 mg | DELAYED_RELEASE_TABLET | Freq: Every day | ORAL | Status: DC
  Administered 2017-04-26: 40 mg via ORAL
  Filled 2017-04-25: qty 1

## 2017-04-25 MED ORDER — POTASSIUM CHLORIDE 10 MEQ/50ML IV SOLN
10.0000 meq | INTRAVENOUS | Status: AC
  Administered 2017-04-25 (×3): 10 meq via INTRAVENOUS
  Filled 2017-04-25 (×3): qty 50

## 2017-04-25 MED ORDER — DEXTROSE IN LACTATED RINGERS 5 % IV SOLN
INTRAVENOUS | Status: AC
  Administered 2017-04-25 – 2017-04-26 (×3): via INTRAVENOUS
  Filled 2017-04-25 (×5): qty 1000

## 2017-04-25 MED ORDER — WARFARIN SODIUM 1 MG PO TABS
1.0000 mg | ORAL_TABLET | Freq: Every day | ORAL | Status: DC
  Administered 2017-04-25: 1 mg via ORAL
  Filled 2017-04-25: qty 1

## 2017-04-25 NOTE — Discharge Summary (Signed)
Physician Discharge Summary       [address]       [place]manda [name] [address]             [phone]    Patient ID: Amanda [name] MRN: [id number] DOB/AGE: Mar 04, 1941 76 y.o.  Admit date: 04/16/2017 Discharge date: 04/26/2017  Admission Diagnoses: 1. Severe mitral regurgitation 2. Recurrent, paroxysmal atrial fibrillation  Activee Diagnoses:  1. Essential hypertension 2. CKD (chronic kidney disease), stage III (HCC) 3. History of CVA (cerebrovascular accident) 4. Hyperlipidemia 5. Hypertension 6. Hyperparathyroidism (HCC) 7. IBS (irritable bowel syndrome) 8. Interstitial cystitis 9. Mobitz II-s/p STJ dual chamber PPM  10. PAC (premature atrial contraction) 11. PVC's (premature ventricular contractions) 12. DJD (degenerative joint disease) 13. Anemia 14. Allergic rhinitis 15. Tobacco abuse 16. Acute encephalopathy/delirium  Consults: pulmonary/intensive care  Procedure (s):   Minimally-Invasive Mitral Valve Repair             Complex valvuloplasty including triangular resection of posterior leaflet (P2)             Artificial Gore-tex neochords x6             Sorin Memo 3D Ring Annuloplasty (size 68mm, catalog [id number], serial [id number])   Minimally-Invasive Maze Procedure             Complete bilateral atrial lesion set using cryothermy and bipolar radiofrequency ablation             Clipping of Left Atrial Appendage (Atricure Pro245 left atrial clip, size 45 mm) by Dr. Cornelius Moras on 04/16/2017.  Reexploration of right mini-thoracotomy for bleeding by Dr. Cornelius Moras on 04/16/2017.  History of Presenting Illness: Patient is a 76 year old female with history of mitral valve prolapse and mitral regurgitation, hypertension, stage III chronic kidney disease secondary to glomerulonephritis and hypertension, heart block status post permanent pacemaker placement, paroxysmal atrial fibrillation on long-term anticoagulation, hyperparathyroidism, hypothyroidism,  hyperlipidemia, interstitial cystitis with recurrent urinary tract infections, and small vessel cerebrovascular disease who has been referred for surgical consultation to discuss treatment for management of severe symptomatic mitral regurgitation. The patient states that she was first noted to have a heart murmur on physical exam more than 30 years ago. For many years she was followed by Dr. [name] with known history of mitral valve prolapse and more recently she has been followed by [name]. She developed symptomatic bradycardia for which she underwent placement of a permanent pacemaker by Dr. [name] in 2016. Echocardiogram at that time revealed normal left ventricular systolic function with mild to moderate mitral regurgitation. Follow-up echocardiogram performed in June 2017 revealed preserved left ventricular systolic function but significant worsening in severity of mitral regurgitation. The patient remained asymptomatic at that time. Interrogation of her pacemaker revealed episodes of paroxysmal atrial fibrillation. The patient was started on Eliquis for anticoagulation. Over the past couple of months the patient has developed significant progression of symptoms of exertional shortness of breath. TEE was planned but had to be delayed because of esophageal dysmotility with a lower esophageal ring. She subsequently underwent EGD with esophageal dilatation. TEE performed 03/18/2017 revealed normal left ventricular systolic function with mitral valve prolapse causing severe mitral regurgitation. Patient was referred for elective surgical consultation.  The patient is married and lives with her husband in [place]. 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 She has remained reasonably active and functionally independent during retirement. Over the last several months  she has developed significant  symptoms of exertional shortness of breath. She denies any symptoms of resting shortness of breath but she gets short of breath if she lays flat in bed. She has not had any palpitations. She denies any history of chest pain or chest tightness. She has not had dizzy spells or syncope. She has a persistent dry cough. Her mobility remains fairly good although she states that she is limited both by exertional shortness of breath as well as muscle pain in the thighs of both legs with ambulation. She has some degenerative arthritis. She does not require any mechanical support for ambulation and she states that her balance is good. She has had multiple urinary tract infections in the past including one associated with severe sepsis for which she was hospitalized 2 years ago. She had delirium at that time and apparently underwent an MRI of the brain that revealed small vessel disease. She is currently finishing a prescription of antibiotics for a recent urinary infection. She denies any ongoing dysuria.  The patient and her family were again counseled at length regarding the indications, risks and potential benefits of mitral valve repair and maze procedure. expectations for her postoperative convalescence at been discussed. The rationale for elective surgery has been explained, including a comparison between surgery and continued medical therapy with close follow-up. The likelihood of successful and durable valve repair has been discussed with particular reference to the findings of their recent echocardiogram. Based upon these findings and previous experience, I have quoted them a greater than 90percent likelihood of successful valve repair. In the unlikely event that their valve cannot be successfully repaired, we discussed the possibility of replacing the mitral valve using a mechanical prosthesis with the attendant need for long-term anticoagulation versus the alternative of replacing it using a bioprosthetic  tissue valve with its potential for late structural valve deterioration and failure, depending upon the patient's longevity. The patient specifically requests that if the mitral valve must be replaced that it be done using a bioprosthetic tissuevalve. Potential risks, benefits, and complications of the surgery were discussed with the patient and she agreed to proceed with surgery. She underwent a minimally invasive mitral valve repair, MAZE on 04/16/2017.  Brief Hospital Course:  The patient was extubated the afternoon of post operative day one. She remained afebrile and hemodynamically stable. She had confusion and agitation initially post op but this did resolve with time. She remained NPO for a few days post op until mental status improved. Nutrition was consulted to help with caloric intake and start TPN on 04/20/2017. She remained AV paced (has permanent PPM). Theone Murdoch, a line, and foley were removed early in the post operative course. Chest tubes remained in for a few days and when output decreased, they were removed. CCM/pulmonary was consulted to assist in management with encephalopathy. CT of the head showed no acute cranial abnormalities, stable atrophy, moderate chronic microvascular ischemic change and old right posterior MCA distribution infarct. She was volume over loaded and diuresed;however, after a few days diuresis was stopped. She has a history of CKD and her creatinine was elevated post op. Her last creatinine was 1.71 . She did have hypernatremia as well. This was thought secondary to dehydration. She was started on Coumadin, once able to take po on 04/22/2017. PT and INR were monitored daily. She is currently on 1 mg daily. Her latest INR is 2.04.  She was restarted on Atenolol on 04/22/2017. She had ABL anemia.  Last H and  H was 11.2/36.5 . She was weaned off the insulin drip.  The patient's HGA1C pre op was 5.8. TPN was weaned and then stop as the tolerated advancement in her diet.  Nutrition continued to provide recommendations for diet as wanted to avoid aspiration.  She continues to slowly make progress with PT. She was ambulating on room air. Epicardial pacing wires were removed on 04/19/2017. Chest tube sutures will be removed the day of discharge. The patient is felt surgically stable for discharge today.   Latest Vital Signs: Blood pressure (!) 127/94, pulse 74, temperature (!) 97.5 F (36.4 C), temperature source Oral, resp. rate 17, height 5' 6.5" (1.689 m), weight 138 lb 9.6 oz (62.9 kg), SpO2 93 %.  Physical Exam:   Rhythm:                       AV paced             Breath sounds:            clear             Heart sounds:              RRR w/out murmur             Incisions:                     Clean and dry             Abdomen:                    Soft, non-distended, non-tender             Extremities:                 Warm, well-perfused   Discharge Condition: Stable and discharged to CIR  Recent laboratory studies:  Lab Results  Component Value Date   WBC 16.1 (H) 04/26/2017   HGB 11.2 (L) 04/26/2017   HCT 36.5 04/26/2017   MCV 94.1 04/26/2017   PLT 100 (L) 04/26/2017   Lab Results  Component Value Date   NA 149 (H) 04/26/2017   K 3.8 04/26/2017   CL 115 (H) 04/26/2017   CO2 28 04/26/2017   CREATININE 1.71 (H) 04/26/2017   GLUCOSE 153 (H) 04/26/2017    Diagnostic Studies:   Ct Head Wo Contrast  Result Date: 04/19/2017 CLINICAL DATA:  AMS - patient not following commands or communicating with staff. Yesterday she was up walking around. EXAM: CT HEAD WITHOUT CONTRAST TECHNIQUE: Contiguous axial images were obtained from the base of the skull through the vertex without intravenous contrast. COMPARISON:  04/01/2015 FINDINGS: Brain: No evidence of acute infarction, hemorrhage, hydrocephalus, extra-axial collection or mass lesion/mass effect. There is ventricular and sulcal enlargement reflecting mild diffuse atrophy, stable. Focal  hypoattenuation in the right parietal lobe reflects an old infarct, stable. There is patchy white matter hypoattenuation bilaterally consistent with moderate chronic microvascular ischemic change. Vascular: No hyperdense vessel or unexpected calcification. Skull: Normal. Negative for fracture or focal lesion. Sinuses/Orbits: Globes and orbits are unremarkable. Sinuses and mastoid air cells are clear. Other: No change from prior head CT. IMPRESSION: 1. No acute intracranial abnormalities. 2. 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 Electronically Signed   By: Amie Portland M.D.   On: 04/19/2017 12:45    Dg Chest Port 1 View  Result Date: 04/24/2017 CLINICAL DATA:  Follow-up right-sided pneumothorax EXAM: PORTABLE CHEST 1 VIEW COMPARISON:  Portable chest x-ray of April 23, 2017 FINDINGS: There remains an approximately 5-10% right-sided pneumothorax. A pleural line is visible in the apex and inferiorly and laterally. There is right basilar subsegmental atelectasis. There is a small right pleural effusion. The left lung is clear. The cardiac silhouette is enlarged. The left atrial appendage clip and the prosthetic mitral valve ring are in stable position. The pulmonary vascularity is normal. There is calcification in the wall of the aortic arch. The ICD is in stable position. IMPRESSION: Slight interval decrease in conspicuity of the right apical and inferolateral pneumothorax. Persistent right basilar atelectasis and probable small right pleural effusion. Stable cardiomegaly without pulmonary edema. Thoracic aortic atherosclerosis. Electronically Signed   By: David  [place] M.D.   On: 04/24/2017 08:04   Dg Swallowing Func-speech Pathology  Result Date: 04/24/2017 Objective Swallowing Evaluation: Type of Study: MBS-Modified Barium Swallow Study Patient Details Name: AVRIANA JOO MRN: [id number] Date of Birth: [birth year] Today's Date:  04/24/2017 Time: SLP Start Time (ACUTE ONLY): 0934-SLP Stop Time (ACUTE ONLY): 0953 SLP Time Calculation (min) (ACUTE ONLY): 19 min Past Medical History: Past Medical History: Diagnosis Date . Allergic rhinitis  . Anemia  . CKD (chronic kidney disease), stage III (HCC)   stage III . DJD (degenerative joint disease)  . Glomerulonephritis   Dr [name] . Hyperlipidemia  . Hyperparathyroidism (HCC)  . Hypertension  . Hypothyroidism  . IBS (irritable bowel syndrome)  . Interstitial cystitis  . Mitral regurgitation  . Mobitz II   a. s/p STJ dual chamber PPM  . MVP (mitral valve prolapse)   moderate posterior MVP with moderate MR and grade II diasotlic dysfunction . PAC (premature atrial contraction)  . PAF (paroxysmal atrial fibrillation) (HCC)    note on pacer check. CHADS2VASC score is 4 now on Eliquis. . Paroxysmal atrial fibrillation (HCC)   CHADS2VASC score is 4 . PVC's (premature ventricular contractions)  . S/P minimally invasive maze operation for atrial fibrillation 04/16/2017  Complete bilateral atrial lesion set using cryothermy and bipolar radiofrequency ablation with clipping of LA appendage via right mini thoracotomy approach . S/P minimally invasive mitral valve repair 04/16/2017  Complex valvuloplasty including triangular resection of posterior leaflet, artificial Gore-tex neochords x6 and Sorin Memo 3D ring annuloplasty ([id number], size 32, serial # [id number]) . S/P placement of cardiac pacemaker  . Small vessel disease, cerebrovascular  Past Surgical History: Past Surgical History: Procedure Laterality Date . ABDOMINAL HYSTERECTOMY   . BREAST BIOPSY Right  . CLIPPING OF ATRIAL APPENDAGE  04/16/2017  Procedure: CLIPPING OF ATRIAL APPENDAGE using AtriCure Pro2 clip 45;  Surgeon: Purcell Nails, MD;  Location: [facility] OR;  Service: Open Heart Surgery;; . CYSTOSTOMY W/ BLADDER BIOPSY   . EP IMPLANTABLE DEVICE N/A 01/07/2015  STJ dual chamber pacemaker implanted by Dr [name] for 2:1 heart block . MINIMALLY INVASIVE MAZE  PROCEDURE N/A 04/16/2017  Procedure: MINIMALLY INVASIVE MAZE PROCEDURE;  Surgeon: Purcell Nails, MD;  Location: [facility] OR;  Service: Open Heart Surgery;  Laterality: N/A; . MITRAL VALVE REPAIR Right 04/16/2017  Procedure: MINIMALLY INVASIVE MITRAL VALVE REPAIR (MVR);  Surgeon: Purcell Nails, MD;  Location: [facility] OR;  Service: Open Heart Surgery;  Laterality: Right; . MITRAL VALVE REPAIR Right 04/16/2017  Procedure: RE-EXPLORATION RIGHT THORACOTOMY FOR BLEEDING;  Surgeon: Purcell Nails, MD;  Location: [facility] OR;  Service: Open Heart Surgery;  Laterality: Right; . PERIPHERAL VASCULAR CATHETERIZATION N/A 02/03/2015  Procedure: Upper Extremity Venography;  Surgeon:  [name], MD;  Location: [facility] INVASIVE CV LAB;  Service: Cardiovascular;  Laterality: N/A; . RIGHT/LEFT HEART CATH AND CORONARY ANGIOGRAPHY N/A 03/26/2017  Procedure: RIGHT/LEFT HEART CATH AND CORONARY ANGIOGRAPHY;  Surgeon: [name], MD;  Location: [facility] INVASIVE CV LAB;  Service: Cardiovascular;  Laterality: N/A; . TEE WITHOUT CARDIOVERSION N/A 03/18/2017  Procedure: TRANSESOPHAGEAL ECHOCARDIOGRAM (TEE);  Surgeon: [name], MD;  Location: [facility] ENDOSCOPY;  Service: Cardiovascular;  Laterality: N/A; . TEE WITHOUT CARDIOVERSION N/A 04/16/2017  Procedure: TRANSESOPHAGEAL ECHOCARDIOGRAM (TEE);  Surgeon: Purcell Nails, MD;  Location: [facility] OR;  Service: Open Heart Surgery;  Laterality: N/A; . TONSILLECTOMY   HPI: 76 yo female with an extensive pmh including esophageal dysmotility and stricture, s/p balloon dilation (02/19/17), who underwent maze and minimal invasive mitral valve repair 10/9. Subsequently encephalopathic, hypoxic requiring bipap. 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 Referred for swallowing evaluation. MBS recommended to fully assess pharyngeal phase swallow given consistent throat clear with thin and thick consistencies.  Subjective: Alert,  upright in chair Assessment / Plan / Recommendation CHL IP CLINICAL IMPRESSIONS 04/24/2017 Clinical Impression Pt exhibited significant anxiety during MBS however redirectable with verbal/tactile encouragement. Study was limited in amount of thin trials observed. Orally, pt held thin bolus with cues needed for swallow (suspect due to anxiety) with anterior spill using straw. Piecemeal swallows with significant lingual residue spilling to pyriforms and sitting for 15-20 seconds again likely impacted by internal distractions. Laryngeal protection and motor tract was intact throughout study without encrouchment of barium into laryngeal vestibule. No significant pharyngeal residue. MBS does not diagnose below the level of the UES however barium stasis noted in the distal esophagus. Consistent throat clear is suspected from primary esophageal source. Recommend upgrade liquids to thin and diet to Dys 3 (mech soft), check oral cavity for pocketed food, full supervision and meds whole in applesauce and reflux precautions. Pt may need texture downgrade if pocketing. ST to continue.  SLP Visit Diagnosis (No Data) Attention and concentration deficit following -- Frontal lobe and executive function deficit following -- Impact on safety and function Mild aspiration risk   CHL IP TREATMENT RECOMMENDATION 04/24/2017 Treatment Recommendations Therapy as outlined in treatment plan below   Prognosis 04/24/2017 Prognosis for Safe Diet Advancement Good Barriers to Reach Goals Cognitive deficits Barriers/Prognosis Comment -- CHL IP DIET RECOMMENDATION 04/24/2017 SLP Diet Recommendations Dysphagia 3 (Mech soft) solids;Thin liquid Liquid Administration via Cup;Straw Medication Administration Whole meds with puree Compensations Slow rate;Small sips/bites Postural Changes Remain semi-upright after after feeds/meals (Comment);Seated upright at 90 degrees   CHL IP OTHER RECOMMENDATIONS 04/24/2017 Recommended Consults -- Oral Care  Recommendations Oral care BID Other Recommendations --   CHL IP FOLLOW UP RECOMMENDATIONS 04/24/2017 Follow up Recommendations Inpatient Rehab   CHL IP FREQUENCY AND DURATION 04/24/2017 Speech Therapy Frequency (ACUTE ONLY) min 2x/week Treatment Duration 2 weeks      CHL IP ORAL PHASE 04/24/2017 Oral Phase Impaired Oral - Pudding Teaspoon -- Oral - Pudding Cup -- Oral - Honey Teaspoon -- Oral - Honey Cup -- Oral - Nectar Teaspoon -- Oral - Nectar Cup -- Oral - Nectar Straw -- Oral - Thin Teaspoon -- Oral - Thin Cup Premature spillage;Lingual/palatal residue;Piecemeal swallowing Oral - Thin Straw Holding of bolus;Right anterior bolus loss;Left anterior bolus loss Oral - Puree -- Oral - Mech Soft -- Oral - Regular Delayed oral transit Oral - Multi-Consistency -- Oral - Pill -- Oral Phase - Comment --  CHL IP PHARYNGEAL PHASE 04/24/2017 Pharyngeal Phase -- Pharyngeal- Pudding Teaspoon -- Pharyngeal -- Pharyngeal- Pudding Cup -- Pharyngeal -- Pharyngeal- Honey Teaspoon -- Pharyngeal -- Pharyngeal- Honey Cup -- Pharyngeal -- Pharyngeal- Nectar Teaspoon -- Pharyngeal -- Pharyngeal- Nectar Cup -- Pharyngeal -- Pharyngeal- Nectar Straw -- Pharyngeal -- Pharyngeal- Thin Teaspoon -- Pharyngeal -- Pharyngeal- Thin Cup Delayed swallow initiation-pyriform sinuses Pharyngeal -- Pharyngeal- Thin Straw Delayed swallow initiation-pyriform sinuses Pharyngeal -- Pharyngeal- Puree -- Pharyngeal -- Pharyngeal- Mechanical Soft -- Pharyngeal -- Pharyngeal- Regular WFL Pharyngeal -- Pharyngeal- Multi-consistency -- Pharyngeal -- Pharyngeal- Pill -- Pharyngeal -- Pharyngeal Comment --  CHL IP CERVICAL ESOPHAGEAL PHASE 04/24/2017 Cervical Esophageal Phase WFL Pudding Teaspoon -- Pudding Cup -- Honey Teaspoon -- Honey Cup -- Nectar Teaspoon -- Nectar Cup -- Nectar Straw -- Thin Teaspoon -- Thin Cup -- Thin Straw -- Puree -- Mechanical Soft -- Regular -- Multi-consistency -- Pill -- Cervical Esophageal Comment -- No flowsheet data found.  Royce MacadamiaLitaker, Lisa Willis 04/24/2017, 11:48 AM Breck CoonsLisa Willis Lonell FaceLitaker M.Ed CCC-SLP Pager [phone]              Ct Angio Chest Aorta W/cm &/or Wo/cm  Result Date: 04/08/2017 CLINICAL DATA:  76 year old female undergoing preoperative evaluation prior to mitral valve repair. EXAM: CT ANGIOGRAPHY CHEST, ABDOMEN AND PELVIS TECHNIQUE: Multidetector CT imaging through the chest, abdomen and pelvis was performed using the standard protocol during bolus administration of intravenous contrast. Multiplanar reconstructed images and MIPs were obtained and reviewed to evaluate the vascular anatomy. CONTRAST:  75 mL Isovue 370 COMPARISON:  None. FINDINGS: CTA CHEST FINDINGS Cardiovascular: Initial noncontrast CT images demonstrate no high attenuation material in the region of the aortic media to suggest an acute intramural hematoma. Following injection of intravenous contrast material, the aorta and its branches are well opacified. 2 vessel aortic arch. The right brachiocephalic and left common carotid artery share a common origin. The aortic root is within normal limits at 3.5 cm in diameter. The tubular portion of the ascending aorta is also within normal limits at 3.3 cm in diameter. The aortic arch and descending thoracic aorta are also normal in caliber. Atherosclerotic calcifications are present in the aortic arch. A left subclavian approach cardiac rhythm maintenance device is noted. Leads terminate in the right atrium and right ventricular apex. Trace coronary artery calcifications. Mild cardiomegaly. Slight thickening of the mitral valve cusps consistent with the clinical history of mitral valve disease. No pericardial effusion. Mediastinum/Nodes: Unremarkable CT appearance of the thyroid gland. No suspicious mediastinal or hilar adenopathy. No soft tissue mediastinal mass. The thoracic esophagus is unremarkable. Lungs/Pleura: Mild biapical pleuroparenchymal scarring. No pulmonary edema, pleural effusion or pneumothorax. No  focal airspace consolidation. No suspicious pulmonary mass or nodule. Musculoskeletal: No acute fracture or aggressive appearing lytic or blastic osseous lesion. Review of the MIP images confirms the above findings. CTA ABDOMEN AND PELVIS FINDINGS VASCULAR Aorta: Mild heterogeneous atherosclerotic plaque throughout the abdominal aorta. No evidence of aneurysm or dissection. Celiac: Patent without evidence of aneurysm, dissection, vasculitis or significant stenosis. The lateral segmental branch of the left hepatic artery is replaced to the left gastric artery. SMA: Patent without evidence of aneurysm, dissection, vasculitis or significant stenosis. Renals: Both renal arteries are patent without evidence of aneurysm, dissection, vasculitis, fibromuscular dysplasia or significant stenosis. IMA: Patent without evidence of aneurysm, dissection, vasculitis or significant stenosis. Inflow: Patent without evidence of aneurysm, dissection, vasculitis or significant stenosis. Veins: No obvious venous abnormality within the limitations of this arterial phase study. Review of the MIP  images confirms the above findings. NON-VASCULAR Hepatobiliary: Normal hepatic contour and morphology. Circumscribed 1.4 cm water attenuation structure centrally within hepatic segment 8 is most consistent with a simple cyst. There is a second smaller subcentimeter low-attenuation lesion in hepatic segment 2 which is too small to characterize but also statistically likely a benign cyst. No definite solid hepatic mass. Gallbladder is unremarkable. No intra or extrahepatic biliary ductal dilatation. Pancreas: Unremarkable. No pancreatic ductal dilatation or surrounding inflammatory changes. Spleen: Normal in size without focal abnormality. Adrenals/Urinary Tract: Normal adrenal glands. No hydronephrosis, nephrolithiasis or enhancing renal mass. Multiple circumscribed bilateral hypodense renal lesions all measuring less than 1 cm in diameter are too  small for accurate characterization. Statistically, these are highly likely benign cysts. The bladder is relatively decompressed and unremarkable in appearance. Stomach/Bowel: No evidence of obstruction or focal bowel wall thickening. Normal appendix in the right lower quadrant. The terminal ileum is unremarkable. Lymphatic: No suspicious lymphadenopathy. Reproductive: Status post hysterectomy. No adnexal masses. Other: No abdominal wall hernia or abnormality. No abdominopelvic ascites. Musculoskeletal: No acute fracture or aggressive appearing lytic or blastic osseous lesion. Mild levoconvex scoliosis centered at the thoracolumbar junction with associated multilevel thoracolumbar degenerative disc disease. Review of the MIP images confirms the above findings. IMPRESSION: CTA CHEST 1. Mildly thickened mitral valve consistent with the clinical history of mitral valvular disease. 2. Normal caliber thoracic aorta and aortic root. 3. Mild coronary artery calcifications. 4. Mild cardiomegaly. 5. Cardiac rhythm maintenance device with leads terminating in the right atrium and right ventricular apex. 6.  Aortic Atherosclerosis (ICD10-170.0) 7. Mild biapical pleuroparenchymal scarring. CTA ABD/PELVIS 1. Mild scattered atherosclerotic plaque without evidence of aneurysm, dissection or significant stenosis. 2. Variant hepatic artery anatomy noted incidentally. 3. Probable hepatic and bilateral renal cysts. 4. Surgical changes of prior hysterectomy. 5. Mild levoconvex scoliosis centered at the thoracolumbar junction with associated multilevel thoracolumbar degenerative disc disease. Signed, Sterling Big, MD [facility] Radiology Electronically Signed   By: Malachy Moan M.D.   On: 04/08/2017 15:37   Ct Angio Abd/pel W/ And/or W/o  Result Date: 04/08/2017 CLINICAL DATA:  76 year old female undergoing preoperative evaluation prior to mitral valve repair. EXAM: CT  ANGIOGRAPHY CHEST, ABDOMEN AND PELVIS TECHNIQUE: Multidetector CT imaging through the chest, abdomen and pelvis was performed using the standard protocol during bolus administration of intravenous contrast. Multiplanar reconstructed images and MIPs were obtained and reviewed to evaluate the vascular anatomy. CONTRAST:  75 mL Isovue 370 COMPARISON:  None. FINDINGS: CTA CHEST FINDINGS Cardiovascular: Initial noncontrast CT images demonstrate no high attenuation material in the region of the aortic media to suggest an acute intramural hematoma. Following injection of intravenous contrast material, the aorta and its branches are well opacified. 2 vessel aortic arch. The right brachiocephalic and left common carotid artery share a common origin. The aortic root is within normal limits at 3.5 cm in diameter. The tubular portion of the ascending aorta is also within normal limits at 3.3 cm in diameter. The aortic arch and descending thoracic aorta are also normal in caliber. Atherosclerotic calcifications are present in the aortic arch. A left subclavian approach cardiac rhythm maintenance device is noted. Leads terminate in the right atrium and right ventricular apex. Trace coronary artery calcifications. Mild cardiomegaly. Slight thickening of the mitral valve cusps consistent with the clinical history of mitral valve disease. No pericardial effusion. Mediastinum/Nodes: Unremarkable CT appearance of the thyroid gland. No suspicious mediastinal or hilar adenopathy. No soft tissue mediastinal mass. The  thoracic esophagus is unremarkable. Lungs/Pleura: Mild biapical pleuroparenchymal scarring. No pulmonary edema, pleural effusion or pneumothorax. No focal airspace consolidation. No suspicious pulmonary mass or nodule. Musculoskeletal: No acute fracture or aggressive appearing lytic or blastic osseous lesion. Review of the MIP images confirms the above findings. CTA ABDOMEN AND PELVIS FINDINGS VASCULAR Aorta: Mild  heterogeneous atherosclerotic plaque throughout the abdominal aorta. No evidence of aneurysm or dissection. Celiac: Patent without evidence of aneurysm, dissection, vasculitis or significant stenosis. The lateral segmental branch of the left hepatic artery is replaced to the left gastric artery. SMA: Patent without evidence of aneurysm, dissection, vasculitis or significant stenosis. Renals: Both renal arteries are patent without evidence of aneurysm, dissection, vasculitis, fibromuscular dysplasia or significant stenosis. IMA: Patent without evidence of aneurysm, dissection, vasculitis or significant stenosis. Inflow: Patent without evidence of aneurysm, dissection, vasculitis or significant stenosis. Veins: No obvious venous abnormality within the limitations of this arterial phase study. Review of the MIP images confirms the above findings. NON-VASCULAR Hepatobiliary: Normal hepatic contour and morphology. Circumscribed 1.4 cm water attenuation structure centrally within hepatic segment 8 is most consistent with a simple cyst. There is a second smaller subcentimeter low-attenuation lesion in hepatic segment 2 which is too small to characterize but also statistically likely a benign cyst. No definite solid hepatic mass. Gallbladder is unremarkable. No intra or extrahepatic biliary ductal dilatation. Pancreas: Unremarkable. No pancreatic ductal dilatation or surrounding inflammatory changes. Spleen: Normal in size without focal abnormality. Adrenals/Urinary Tract: Normal adrenal glands. No hydronephrosis, nephrolithiasis or enhancing renal mass. Multiple circumscribed bilateral hypodense renal lesions all measuring less than 1 cm in diameter are too small for accurate characterization. Statistically, these are highly likely benign cysts. The bladder is relatively decompressed and unremarkable in appearance. Stomach/Bowel: No evidence of obstruction or focal bowel wall thickening. Normal appendix in the right lower  quadrant. The terminal ileum is unremarkable. Lymphatic: No suspicious lymphadenopathy. Reproductive: Status post hysterectomy. No adnexal masses. Other: No abdominal wall hernia or abnormality. No abdominopelvic ascites. Musculoskeletal: No acute fracture or aggressive appearing lytic or blastic osseous lesion. Mild levoconvex scoliosis centered at the thoracolumbar junction with associated multilevel thoracolumbar degenerative disc disease. Review of the MIP images confirms the above findings. IMPRESSION: CTA CHEST 1. Mildly thickened mitral valve consistent with the clinical history of mitral valvular disease. 2. Normal caliber thoracic aorta and aortic root. 3. Mild coronary artery calcifications. 4. Mild cardiomegaly. 5. Cardiac rhythm maintenance device with leads terminating in the right atrium and right ventricular apex. 6.  Aortic Atherosclerosis (ICD10-170.0) 7. Mild biapical pleuroparenchymal scarring. CTA ABD/PELVIS 1. Mild scattered atherosclerotic plaque without evidence of aneurysm, dissection or significant stenosis. 2. Variant hepatic artery anatomy noted incidentally. 3. Probable hepatic and bilateral renal cysts. 4. Surgical changes of prior hysterectomy. 5. Mild levoconvex scoliosis centered at the thoracolumbar junction with associated multilevel thoracolumbar degenerative disc disease. Signed, Sterling Big, MD [facility] Radiology Electronically Signed   By: Malachy Moan M.D.   On: 04/08/2017 15:37    Discharge Medications: Allergies as of 04/26/2017      Reactions   Pepcid [famotidine] Swelling, Other (See Comments)   "Throat Swelling"   Allopurinol Other (See Comments)   Caused headaches   Amlodipine Other (See Comments)   PEDAL EDEMA    Colchicine Other (See Comments)   UNSPECIFIED REACTION    Contrast Media [iodinated Diagnostic Agents] Hives   IVP dye per patient   Prilosec [omeprazole] Nausea Only    Atorvastatin Other (See Comments)  UNSPECIFIED REACTION    Cephalexin Itching, Rash   Ciprofloxacin Itching, Rash   Erythromycin Itching, Rash   Sulfonamide Derivatives Itching, Rash   Tetracycline Itching, Rash      Medication List    STOP taking these medications   apixaban 5 MG Tabs tablet Commonly known as:  ELIQUIS   furosemide 20 MG tablet Commonly known as:  LASIX   naproxen sodium 220 MG tablet Commonly known as:  ANAPROX     TAKE these medications   atenolol 50 MG tablet Commonly known as:  TENORMIN Take 1 tablet (50 mg total) by mouth daily. What changed:  when to take this   conjugated estrogens vaginal cream Commonly known as:  PREMARIN Place 1 Applicatorful vaginally every 3 (three) days.   fluticasone 50 MCG/ACT nasal spray Commonly known as:  FLONASE Place 2 sprays into both nostrils daily.   hydrocortisone valerate ointment 0.2 % Commonly known as:  WEST-CORT Apply 1 application topically 2 (two) times daily as needed (for itching).   levocetirizine 5 MG tablet Commonly known as:  XYZAL Take 5 mg by mouth every evening.   levothyroxine 75 MCG tablet Commonly known as:  SYNTHROID, LEVOTHROID Take 75 mcg by mouth See admin instructions. Take 75 mcg by mouth daily around 0300   NON FORMULARY 1 each by Other route See admin instructions. Allergy injections once weekly   PROBIOTIC DAILY PO Take 1 tablet by mouth daily.   QUEtiapine 50 MG tablet Commonly known as:  SEROQUEL Take 1 tablet (50 mg total) by mouth at bedtime.   RESOURCE THICKENUP CLEAR Powd Nectar thick consistency   warfarin 1 MG tablet Commonly known as:  COUMADIN Take 1 tablet (1 mg total) by mouth daily at 6 PM.       Follow Up Appointments: Follow-up Information    [facility]. Go on 04/30/2017.   Specialty:  Cardiology Why:  Appointment time is at 2:30 pm Contact information: [address] [place]  [address] [phone]       Purcell Nails, MD. Go on 05/13/2017.   Specialty:  Cardiothoracic Surgery Why:  PA/LAT CXR to be taken (at [facility] which is in the same building as Dr. Orvan July office, on the ground floor) on 05/13/2017 at 3:00 pm;Appointment time is at 3:30 pm  Contact information: [address] [employer] [address] [place] [address] [phone]        Laurann Montana, PA-C. Go on 05/17/2017.   Specialties:  Cardiology, Radiology Why:  Appointment time is at 9:30 am Contact information: [address] [place] [address] [phone]           Signed: Marrion Coy 04/26/2017, 3:06 PM

## 2017-04-25 NOTE — Progress Notes (Signed)
OT Cancellation Note  Patient Details Name: Amanda [name] MRN: [id number] DOB: [birth year]   Cancelled Treatment:    Reason Eval/Treat Not Completed: Fatigue/lethargy limiting ability to participate.  Pt fatigued - just walked with nsg.  Will reattempt.  [employer], OTR/L [phone]   Jeani Hawking M 04/25/2017, 10:02 AM

## 2017-04-25 NOTE — H&P (Signed)
Physical Medicine and Rehabilitation Admission H&P    Chief complaint: Weakness  HPI:  Amanda [name] is a 76 y.o. right handed female with history of mitral valve prolapse and mitral regurgitation, CVA, hypertension, stage III CKD, heart block with pacemaker, PAF on long-term anticoagulation/Eliquis. Patient lives with spouse. Reportedly independent and driving prior to admission.  Presented 04/15/2017 for evaluation and treatment of symptomatic mitral regurgitation. Recent echocardiogram with normal left ventricular systolic function mild to moderate mitral regurgitation. Follow-up echo showed significant worsening in severity of mitral regurgitation. Underwent Maze procedure 04/16/2017 per Dr. Roxy Manns. Postoperatively patient with 150-250 mL of chest tube output per hour and underwent reexploration of right mini thoracotomy for any bleeding same date 04/16/2017 with no findings of reaccumulated blood with ongoing bleeding Hospital course pain management. Sternal precautions as directed. Hospital course leukocytosis 21,600 and monitored with latest of 17,200 . Intermittent bouts of confusion and restlessness. Cranial CT scan reviewed, unremarkable for acute process. Subcutaneous Lovenox for DVT prophylaxis and transitioned to Coumadin. Initially maintained on TPN for nutritional support and diet advanced to a dysphagia #2 thin liquid diet Physical and occupational therapy evaluation completed with recommendations of physical medicine rehabilitation consult. Patient was admitted for a comprehensive rehabilitation program  Review of Systems  Constitutional: Negative for chills and fever.  HENT: Negative for hearing loss and tinnitus.   Eyes: Negative for blurred vision and double vision.  Respiratory: Positive for shortness of breath. Negative for cough.   Cardiovascular: Positive for palpitations and leg swelling.  Gastrointestinal: Positive for constipation. Negative for nausea and vomiting.    Genitourinary: Positive for urgency.  Musculoskeletal: Positive for joint pain and myalgias.  Neurological: Positive for weakness. Negative for seizures.  All other systems reviewed and are negative.  Past Medical History:  Diagnosis Date  . Allergic rhinitis   . Anemia   . CKD (chronic kidney disease), stage III ([place])    stage III  . DJD (degenerative joint disease)   . Glomerulonephritis    Dr Jimmy Footman  . Hyperlipidemia   . Hyperparathyroidism ([place])   . Hypertension   . Hypothyroidism   . IBS (irritable bowel syndrome)   . Interstitial cystitis   . Mitral regurgitation   . Mobitz II    a. s/p STJ dual chamber PPM   . MVP (mitral valve prolapse)    moderate posterior MVP with moderate MR and grade II diasotlic dysfunction  . PAC (premature atrial contraction)   . PAF (paroxysmal atrial fibrillation) ([place])     note on pacer check. CHADS2VASC score is 4 now on Eliquis.  . Paroxysmal atrial fibrillation (HCC)    CHADS2VASC score is 4  . PVC's (premature ventricular contractions)   . S/P minimally invasive maze operation for atrial fibrillation 04/16/2017   Complete bilateral atrial lesion set using cryothermy and bipolar radiofrequency ablation with clipping of LA appendage via right mini thoracotomy approach  . S/P minimally invasive mitral valve repair 04/16/2017   Complex valvuloplasty including triangular resection of posterior leaflet, artificial Gore-tex neochords x6 and Sorin Memo 3D ring annuloplasty ([id number], size 32, serial # [id number])  . S/P placement of cardiac pacemaker   . Small vessel disease, cerebrovascular    Past Surgical History:  Procedure Laterality Date  . ABDOMINAL HYSTERECTOMY    . BREAST BIOPSY Right   . CLIPPING OF ATRIAL APPENDAGE  04/16/2017   Procedure: CLIPPING OF ATRIAL APPENDAGE using AtriCure Pro2 clip 45;  Surgeon: [name], MD;  Location: [place];  Service: Open Heart Surgery;;  . CYSTOSTOMY W/ BLADDER BIOPSY    . EP IMPLANTABLE DEVICE  N/A 01/07/2015   STJ dual chamber pacemaker implanted by Dr Lovena Le for 2:1 heart block  . MINIMALLY INVASIVE MAZE PROCEDURE N/A 04/16/2017   Procedure: MINIMALLY INVASIVE MAZE PROCEDURE;  Surgeon: [name], MD;  Location: [place];  Service: Open Heart Surgery;  Laterality: N/A;  . MITRAL VALVE REPAIR Right 04/16/2017   Procedure: MINIMALLY INVASIVE MITRAL VALVE REPAIR (MVR);  Surgeon: [name], MD;  Location: [place];  Service: Open Heart Surgery;  Laterality: Right;  . MITRAL VALVE REPAIR Right 04/16/2017   Procedure: RE-EXPLORATION RIGHT THORACOTOMY FOR BLEEDING;  Surgeon: [name], MD;  Location: [place];  Service: Open Heart Surgery;  Laterality: Right;  . PERIPHERAL VASCULAR CATHETERIZATION N/A 02/03/2015   Procedure: Upper Extremity Venography;  Surgeon: Deboraha Sprang, MD;  Location: [facility] [facility];  Service: Cardiovascular;  Laterality: N/A;  . RIGHT/LEFT HEART CATH AND CORONARY ANGIOGRAPHY N/A 03/26/2017   Procedure: RIGHT/LEFT HEART CATH AND CORONARY ANGIOGRAPHY;  Surgeon: [name], MD;  Location: [facility];  Service: Cardiovascular;  Laterality: N/A;  . TEE WITHOUT CARDIOVERSION N/A 03/18/2017   Procedure: TRANSESOPHAGEAL ECHOCARDIOGRAM (TEE);  Surgeon: Skeet Latch, MD;  Location: [place];  Service: Cardiovascular;  Laterality: N/A;  . TEE WITHOUT CARDIOVERSION N/A 04/16/2017   Procedure: TRANSESOPHAGEAL ECHOCARDIOGRAM (TEE);  Surgeon: [name], MD;  Location: [facility];  Service: Open Heart Surgery;  Laterality: N/A;  . TONSILLECTOMY     Family History  Problem Relation Age of Onset  . Heart disease Mother   . Stroke Mother   . Hypertension Mother   . Heart disease Father   . Heart attack Father   . Ovarian cancer Sister   . Heart disease Brother   . Rheum arthritis Sister   . Melanoma Brother   . Brain cancer Brother   . Heart attack Brother   . Hypertension Brother   . Hypertension Sister   . Colon cancer Neg Hx    Social  History:  reports that she has quit smoking. She has never used smokeless tobacco. She reports that she does not drink alcohol or use drugs. Allergies:  Allergies  Allergen Reactions  . Pepcid [Famotidine] Swelling and Other (See Comments)    "Throat Swelling"   . Allopurinol Other (See Comments)    Caused headaches  . Amlodipine Other (See Comments)    PEDAL EDEMA   . Colchicine Other (See Comments)    UNSPECIFIED REACTION   . Contrast Media [Iodinated Diagnostic Agents] Hives    IVP dye per patient  . Prilosec [Omeprazole] Nausea Only  . Atorvastatin Other (See Comments)    UNSPECIFIED REACTION   . Cephalexin Itching and Rash  . Ciprofloxacin Itching and Rash  . Erythromycin Itching and Rash  . Sulfonamide Derivatives Itching and Rash  . Tetracycline Itching and Rash   Facility-Administered Medications Prior to Admission  Medication Dose Route Frequency Provider Last Rate Last Dose  . 0.9 %  sodium chloride infusion  500 mL Intravenous Continuous Armbruster, Carlota Raspberry, MD       Medications Prior to Admission  Medication Sig Dispense Refill  . apixaban (ELIQUIS) 5 MG TABS tablet Take 1 tablet (5 mg total) by mouth 2 (two) times daily. (Patient not taking: Reported on 04/15/2017) 60 tablet 11  . atenolol (TENORMIN) 50 MG tablet Take 1 tablet (50 mg total) by mouth daily. (Patient  taking differently: Take 50 mg by mouth 2 (two) times daily. ) 30 tablet 11  . conjugated estrogens (PREMARIN) vaginal cream Place 1 Applicatorful vaginally every 3 (three) days.     . fluticasone (FLONASE) 50 MCG/ACT nasal spray Place 2 sprays into both nostrils daily.    . furosemide (LASIX) 20 MG tablet Take 1 tablet (20 mg total) by mouth daily. 90 tablet 3  . levocetirizine (XYZAL) 5 MG tablet Take 5 mg by mouth every evening.    [name] levothyroxine (SYNTHROID, LEVOTHROID) 75 MCG tablet Take 75 mcg by mouth See admin instructions. Take 75 mcg by mouth daily around 0300    . naproxen sodium (ANAPROX) 220  MG tablet Take 220 mg by mouth 2 (two) times daily as needed (for pain).     . NON FORMULARY 1 each by Other route See admin instructions. Allergy injections once weekly    . Probiotic Product (PROBIOTIC DAILY PO) Take 1 tablet by mouth daily.    . hydrocortisone valerate ointment (WEST-CORT) 0.2 % Apply 1 application topically 2 (two) times daily as needed (for itching).       Drug Regimen Review Drug regimen was reviewed remains appropriately no significant issues identified  Home: Home Living Family/patient expects to be discharged to:: Inpatient rehab Additional Comments: pt was living at home with spouse in 1 story home with 3 STE, walk in shower with seat in it   Functional History: Prior Function Level of Independence: Independent Comments: driving, dressing, bathing, walking without AD  Functional Status:  Mobility: Bed Mobility Overal bed mobility: Needs Assistance Bed Mobility: Sit to Supine Sit to supine: Min assist General bed mobility comments: assist for descending trunk into bed and LE management back into bed Transfers Overall transfer level: Needs assistance Equipment used:  [place] walker) Transfers: Sit to/from Stand Sit to Stand: Min assist, +2 safety/equipment Stand pivot transfers: Max assist, +2 safety/equipment General transfer comment:  required tactile cues at upper thoracic back to lean forward as well as cues to hug pillow.  Ambulation/Gait Ambulation/Gait assistance: Min assist, +2 physical assistance (3rd person for chair follow) Ambulation Distance (Feet): 175 Feet Assistive device:  (EVa walker) Gait Pattern/deviations: Step-through pattern, Decreased stride length, Trunk flexed, Wide base of support, Drifts right/left General Gait Details: Pt was able to use [place] wlaker with constant cues to stay close toEva walker as well as cue for upright posture.  Pt needed contant cues to sequence steps and [place] walker. Does well with encouragament.  3 rd person  for chair follow.     ADL:    Cognition: Cognition Overall Cognitive Status: Impaired/Different from baseline Orientation Level: Oriented to person, Disoriented to place, Disoriented to time, Disoriented to situation Cognition Arousal/Alertness: Awake/alert Behavior During Therapy: Flat affect Overall Cognitive Status: Impaired/Different from baseline Area of Impairment: Attention, Safety/judgement, Problem solving Current Attention Level: Focused Safety/Judgement: Decreased awareness of safety, Decreased awareness of deficits Problem Solving: Slow processing, Decreased initiation, Difficulty sequencing, Requires verbal cues, Requires tactile cues General Comments: pt with lethargy/fatigue and had difficulty follow commands during MMT  Physical Exam: Blood pressure (!) 116/56, pulse 70, temperature 98.2 F (36.8 C), resp. rate 18, height 5' 6.5" (1.689 m), weight 61.5 kg (135 lb 9.6 oz), SpO2 95 %. Physical Exam  Vitals reviewed. Constitutional: She appears well-nourished. No distress.  HENT:  Head: Normocephalic.  Eyes: Pupils are equal, round, and reactive to light. EOM are normal.  Neck: Normal range of motion. Neck supple. No tracheal deviation present. Thyromegaly present.  Cardiovascular: Normal rate and regular rhythm.  Exam reveals friction rub.   No murmur heard.    Respiratory: No respiratory distress. She has no wheezes. She has no rales. She exhibits tenderness.  Limited inspiratory effort but clear to auscultation  GI: Soft. Bowel sounds are normal. She exhibits no distension. There is no tenderness. There is no rebound.  Musculoskeletal: She exhibits tenderness (along left chest wall and axilla. no visible breakdown or bruising). She exhibits no edema.  Skin. Warm and dry Neurological: She is alert.  Easily distracted. Delayed verbal output, word finding deficits, apraxic? Sometimes perseverative on certain tasks. Asked to her ID several simple objects and either  was unable to name or used some sort of paraphasia  Motor: 4+/5 grossly throughout LUE and LLE. RUE and RLE grossly 3 to 4/5 but inconsistent and with definite motor apraxia. Appeared to sense pain in all 4's.  Psych :generally pleasant and cooperative  Results for orders placed or performed during the hospital encounter of 04/16/17 (from the past 48 hour(s))  Glucose, capillary     Status: Abnormal   Collection Time: 04/23/17  8:19 AM  Result Value Ref Range   Glucose-Capillary 109 (H) 65 - 99 mg/dL  Glucose, capillary     Status: None   Collection Time: 04/23/17 11:59 AM  Result Value Ref Range   Glucose-Capillary 94 65 - 99 mg/dL  Glucose, capillary     Status: Abnormal   Collection Time: 04/23/17  4:15 PM  Result Value Ref Range   Glucose-Capillary 106 (H) 65 - 99 mg/dL   Comment 1 Capillary Specimen   Glucose, capillary     Status: None   Collection Time: 04/23/17  8:06 PM  Result Value Ref Range   Glucose-Capillary 82 65 - 99 mg/dL  Protime-INR     Status: None   Collection Time: 04/24/17  2:12 AM  Result Value Ref Range   Prothrombin Time 14.6 11.4 - 15.2 seconds   INR 2.22   Basic metabolic panel     Status: Abnormal   Collection Time: 04/24/17  2:12 AM  Result Value Ref Range   Sodium 149 (H) 135 - 145 mmol/L   Potassium 4.2 3.5 - 5.1 mmol/L   Chloride 108 101 - 111 mmol/L   CO2 28 22 - 32 mmol/L   Glucose, Bld 99 65 - 99 mg/dL   BUN 77 (H) 6 - 20 mg/dL   Creatinine, Ser 1.53 (H) 0.44 - 1.00 mg/dL   Calcium 9.5 8.9 - 10.3 mg/dL   GFR calc non Af Amer 31 (L) >60 mL/min   GFR calc Af Amer 36 (L) >60 mL/min    Comment: (NOTE) The eGFR has been calculated using the CKD EPI equation. This calculation has not been validated in all clinical situations. eGFR's persistently <60 mL/min signify possible Chronic Kidney Disease.    Anion gap 13 5 - 15  CBC     Status: Abnormal   Collection Time: 04/24/17  2:12 AM  Result Value Ref Range   WBC 21.6 (H) 4.0 - 10.5 K/uL    RBC 4.94 3.87 - 5.11 MIL/uL   Hemoglobin 14.9 12.0 - 15.0 g/dL   HCT 46.4 (H) 36.0 - 46.0 %   MCV 93.9 78.0 - 100.0 fL   MCH 30.2 26.0 - 34.0 pg   MCHC 32.1 30.0 - 36.0 g/dL   RDW 17.6 (H) 11.5 - 15.5 %   Platelets 68 (L) 150 - 400 K/uL    Comment: CONSISTENT  WITH PREVIOUS RESULT  Glucose, capillary     Status: Abnormal   Collection Time: 04/24/17  7:40 AM  Result Value Ref Range   Glucose-Capillary 64 (L) 65 - 99 mg/dL   Comment 1 Capillary Specimen    Comment 2 Notify RN   Glucose, capillary     Status: None   Collection Time: 04/24/17  8:19 AM  Result Value Ref Range   Glucose-Capillary 96 65 - 99 mg/dL   Comment 1 Capillary Specimen    Comment 2 Notify RN   Glucose, capillary     Status: Abnormal   Collection Time: 04/24/17 11:18 AM  Result Value Ref Range   Glucose-Capillary 173 (H) 65 - 99 mg/dL   Comment 1 Capillary Specimen    Comment 2 Notify RN   Glucose, capillary     Status: None   Collection Time: 04/24/17  4:24 PM  Result Value Ref Range   Glucose-Capillary 79 65 - 99 mg/dL   Comment 1 Capillary Specimen    Comment 2 Notify RN   Glucose, capillary     Status: Abnormal   Collection Time: 04/24/17  9:53 PM  Result Value Ref Range   Glucose-Capillary 101 (H) 65 - 99 mg/dL  Protime-INR     Status: Abnormal   Collection Time: 04/25/17  3:38 AM  Result Value Ref Range   Prothrombin Time 19.7 (H) 11.4 - 15.2 seconds   INR 1.69   CBC     Status: Abnormal   Collection Time: 04/25/17  3:38 AM  Result Value Ref Range   WBC 17.2 (H) 4.0 - 10.5 K/uL   RBC 4.19 3.87 - 5.11 MIL/uL   Hemoglobin 12.1 12.0 - 15.0 g/dL   HCT 39.1 36.0 - 46.0 %   MCV 93.3 78.0 - 100.0 fL   MCH 28.9 26.0 - 34.0 pg   MCHC 30.9 30.0 - 36.0 g/dL   RDW 17.4 (H) 11.5 - 15.5 %   Platelets 85 (L) 150 - 400 K/uL    Comment: CONSISTENT WITH PREVIOUS RESULT  Basic metabolic panel     Status: Abnormal   Collection Time: 04/25/17  3:38 AM  Result Value Ref Range   Sodium 151 (H) 135 - 145  mmol/L   Potassium 3.0 (L) 3.5 - 5.1 mmol/L    Comment: DELTA CHECK NOTED   Chloride 109 101 - 111 mmol/L   CO2 33 (H) 22 - 32 mmol/L   Glucose, Bld 121 (H) 65 - 99 mg/dL   BUN 77 (H) 6 - 20 mg/dL   Creatinine, Ser 1.74 (H) 0.44 - 1.00 mg/dL   Calcium 9.0 8.9 - 10.3 mg/dL   GFR calc non Af Amer 27 (L) >60 mL/min   GFR calc Af Amer 31 (L) >60 mL/min    Comment: (NOTE) The eGFR has been calculated using the CKD EPI equation. This calculation has not been validated in all clinical situations. eGFR's persistently <60 mL/min signify possible Chronic Kidney Disease.    Anion gap 9 5 - 15   Dg Chest Port 1 View  Result Date: 04/24/2017 CLINICAL DATA:  Follow-up right-sided pneumothorax EXAM: PORTABLE CHEST 1 VIEW COMPARISON:  Portable chest x-ray of April 23, 2017 FINDINGS: There remains an approximately 5-10% right-sided pneumothorax. A pleural line is visible in the apex and inferiorly and laterally. There is right basilar subsegmental atelectasis. There is a small right pleural effusion. The left lung is clear. The cardiac silhouette is enlarged. The left atrial appendage clip and the prosthetic  mitral valve ring are in stable position. The pulmonary vascularity is normal. There is calcification in the wall of the aortic arch. The ICD is in stable position. IMPRESSION: Slight interval decrease in conspicuity of the right apical and inferolateral pneumothorax. Persistent right basilar atelectasis and probable small right pleural effusion. Stable cardiomegaly without pulmonary edema. Thoracic aortic atherosclerosis. Electronically Signed   By: David  Martinique M.D.   On: 04/24/2017 08:04   Dg Chest Port 1 View  Result Date: 04/23/2017 CLINICAL DATA:  76 year old female with shortness breath. Subsequent encounter. EXAM: PORTABLE CHEST 1 VIEW COMPARISON:  04/21/2017. FINDINGS: Right-sided chest tubes have been removed. Small right apical pneumothorax (5%). Suspect loculated inferior right lateral  pneumothorax (approximately 10%). Right base atelectasis limits evaluation. Minimal medial left base atelectasis. Post valve replacement and atrial clipping. Sequential pacemaker in place. Cardiomegaly. Right PICC line tip distal superior vena cava. IMPRESSION: Right-sided chest tubes have been removed. Small right apical pneumothorax (5%). Suspect loculated inferior right lateral pneumothorax (approximately 10%). Right base atelectasis limits evaluation. Minimal medial left base atelectasis. Post valve replacement and atrial clipping. Sequential pacemaker in place. Cardiomegaly. These results will be called to the ordering clinician or representative by the Radiologist Assistant, and communication documented in the PACS or zVision Dashboard. Electronically Signed   By: Genia Del M.D.   On: 04/23/2017 08:12   Dg Swallowing Func-speech Pathology  Result Date: 04/24/2017 Objective Swallowing Evaluation: Type of Study: MBS-Modified Barium Swallow Study Patient Details Name: MIEKA LEATON MRN: [id number] Date of Birth: 1940-12-03 Today's Date: 04/24/2017 Time: SLP Start Time (ACUTE ONLY): 0934-SLP Stop Time (ACUTE ONLY): 0953 SLP Time Calculation (min) (ACUTE ONLY): 19 min Past Medical History: Past Medical History: Diagnosis Date . Allergic rhinitis  . Anemia  . CKD (chronic kidney disease), stage III ([place])   stage III . DJD (degenerative joint disease)  . Glomerulonephritis   Dr Jimmy Footman . Hyperlipidemia  . Hyperparathyroidism ([place])  . Hypertension  . Hypothyroidism  . IBS (irritable bowel syndrome)  . Interstitial cystitis  . Mitral regurgitation  . Mobitz II   a. s/p STJ dual chamber PPM  . MVP (mitral valve prolapse)   moderate posterior MVP with moderate MR and grade II diasotlic dysfunction . PAC (premature atrial contraction)  . PAF (paroxysmal atrial fibrillation) ([place])    note on pacer check. CHADS2VASC score is 4 now on Eliquis. . Paroxysmal atrial fibrillation ([facility])   CHADS2VASC score is 4 .  PVC's (premature ventricular contractions)  . S/P minimally invasive maze operation for atrial fibrillation 04/16/2017  Complete bilateral atrial lesion set using cryothermy and bipolar radiofrequency ablation with clipping of LA appendage via right mini thoracotomy approach . S/P minimally invasive mitral valve repair 04/16/2017  Complex valvuloplasty including triangular resection of posterior leaflet, artificial Gore-tex neochords x6 and Sorin Memo 3D ring annuloplasty ([id number], size 32, serial # [id number]) . S/P placement of cardiac pacemaker  . Small vessel disease, cerebrovascular  Past Surgical History: Past Surgical History: Procedure Laterality Date . ABDOMINAL HYSTERECTOMY   . BREAST BIOPSY Right  . CLIPPING OF ATRIAL APPENDAGE  04/16/2017  Procedure: CLIPPING OF ATRIAL APPENDAGE using AtriCure Pro2 clip 45;  Surgeon: [name], MD;  Location: [facility];  Service: Open Heart Surgery;; . CYSTOSTOMY W/ BLADDER BIOPSY   . EP IMPLANTABLE DEVICE N/A 01/07/2015  STJ dual chamber pacemaker implanted by Dr Lovena Le for 2:1 heart block . MINIMALLY INVASIVE MAZE PROCEDURE N/A 04/16/2017  Procedure: MINIMALLY INVASIVE MAZE PROCEDURE;  Surgeon: [name],  MD;  Location[facility];  Service: Open Heart Surgery;  Laterality: N/A; . MITRAL VALVE REPAIR Right 04/16/2017  Procedure: MINIMALLY INVASIVE MITRAL VALVE REPAIR (MVR);  Surgeon: [name], MD;  Location: [place];  Service: Open Heart Surgery;  Laterality: Right; . MITRAL VALVE REPAIR Right 04/16/2017  Procedure: RE-EXPLORATION RIGHT THORACOTOMY FOR BLEEDING;  Surgeon: [name], MD;  Location: [place];  Service: Open Heart Surgery;  Laterality: Right; . PERIPHERAL VASCULAR CATHETERIZATION N/A 02/03/2015  Procedure: Upper Extremity Venography;  Surgeon: Deboraha Sprang, MD;  Location: [facility];  Service: Cardiovascular;  Laterality: N/A; . RIGHT/LEFT HEART CATH AND CORONARY ANGIOGRAPHY N/A 03/26/2017  Procedure: RIGHT/LEFT HEART CATH AND CORONARY  ANGIOGRAPHY;  Surgeon: [name], MD;  Location: [facility];  Service: Cardiovascular;  Laterality: N/A; . TEE WITHOUT CARDIOVERSION N/A 03/18/2017  Procedure: TRANSESOPHAGEAL ECHOCARDIOGRAM (TEE);  Surgeon: Skeet Latch, MD;  Location: [place];  Service: Cardiovascular;  Laterality: N/A; . TEE WITHOUT CARDIOVERSION N/A 04/16/2017  Procedure: TRANSESOPHAGEAL ECHOCARDIOGRAM (TEE);  Surgeon: [name], MD;  Location: [place];  Service: Open Heart Surgery;  Laterality: N/A; . TONSILLECTOMY   HPI: 76 yo female with an extensive pmh including esophageal dysmotility and stricture, s/p balloon dilation (02/19/17), who underwent maze and minimal invasive mitral valve repair 10/9. Subsequently encephalopathic, hypoxic requiring bipap. 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 Referred for swallowing evaluation. MBS recommended to fully assess pharyngeal phase swallow given consistent throat clear with thin and thick consistencies.  Subjective: Alert, upright in chair Assessment / Plan / Recommendation CHL IP CLINICAL IMPRESSIONS 04/24/2017 Clinical Impression Pt exhibited significant anxiety during MBS however redirectable with verbal/tactile encouragement. Study was limited in amount of thin trials observed. Orally, pt held thin bolus with cues needed for swallow (suspect due to anxiety) with anterior spill using straw. Piecemeal swallows with significant lingual residue spilling to pyriforms and sitting for 15-20 seconds again likely impacted by internal distractions. Laryngeal protection and motor tract was intact throughout study without encrouchment of barium into laryngeal vestibule. No significant pharyngeal residue. MBS does not diagnose below the level of the UES however barium stasis noted in the distal esophagus. Consistent throat clear is suspected from primary esophageal source. Recommend  upgrade liquids to thin and diet to Dys 3 (mech soft), check oral cavity for pocketed food, full supervision and meds whole in applesauce and reflux precautions. Pt may need texture downgrade if pocketing. ST to continue.  SLP Visit Diagnosis (No Data) Attention and concentration deficit following -- Frontal lobe and executive function deficit following -- Impact on safety and function Mild aspiration risk   CHL IP TREATMENT RECOMMENDATION 04/24/2017 Treatment Recommendations Therapy as outlined in treatment plan below   Prognosis 04/24/2017 Prognosis for Safe Diet Advancement Good Barriers to Reach Goals Cognitive deficits Barriers/Prognosis Comment -- CHL IP DIET RECOMMENDATION 04/24/2017 SLP Diet Recommendations Dysphagia 3 (Mech soft) solids;Thin liquid Liquid Administration via Cup;Straw Medication Administration Whole meds with puree Compensations Slow rate;Small sips/bites Postural Changes Remain semi-upright after after feeds/meals (Comment);Seated upright at 90 degrees   CHL IP OTHER RECOMMENDATIONS 04/24/2017 Recommended Consults -- Oral Care Recommendations Oral care BID Other Recommendations --   CHL IP FOLLOW UP RECOMMENDATIONS 04/24/2017 Follow up Recommendations Inpatient Rehab   CHL IP FREQUENCY AND DURATION 04/24/2017 Speech Therapy Frequency (ACUTE ONLY) min 2x/week Treatment Duration 2 weeks      CHL IP ORAL PHASE 04/24/2017 Oral Phase  Impaired Oral - Pudding Teaspoon -- Oral - Pudding Cup -- Oral - Honey Teaspoon -- Oral - Honey Cup -- Oral - Nectar Teaspoon -- Oral - Nectar Cup -- Oral - Nectar Straw -- Oral - Thin Teaspoon -- Oral - Thin Cup Premature spillage;Lingual/palatal residue;Piecemeal swallowing Oral - Thin Straw Holding of bolus;Right anterior bolus loss;Left anterior bolus loss Oral - Puree -- Oral - Mech Soft -- Oral - Regular Delayed oral transit Oral - Multi-Consistency -- Oral - Pill -- Oral Phase - Comment --  CHL IP PHARYNGEAL PHASE 04/24/2017 Pharyngeal Phase -- Pharyngeal-  Pudding Teaspoon -- Pharyngeal -- Pharyngeal- Pudding Cup -- Pharyngeal -- Pharyngeal- Honey Teaspoon -- Pharyngeal -- Pharyngeal- Honey Cup -- Pharyngeal -- Pharyngeal- Nectar Teaspoon -- Pharyngeal -- Pharyngeal- Nectar Cup -- Pharyngeal -- Pharyngeal- Nectar Straw -- Pharyngeal -- Pharyngeal- Thin Teaspoon -- Pharyngeal -- Pharyngeal- Thin Cup Delayed swallow initiation-pyriform sinuses Pharyngeal -- Pharyngeal- Thin Straw Delayed swallow initiation-pyriform sinuses Pharyngeal -- Pharyngeal- Puree -- Pharyngeal -- Pharyngeal- Mechanical Soft -- Pharyngeal -- Pharyngeal- Regular WFL Pharyngeal -- Pharyngeal- Multi-consistency -- Pharyngeal -- Pharyngeal- Pill -- Pharyngeal -- Pharyngeal Comment --  CHL IP CERVICAL ESOPHAGEAL PHASE 04/24/2017 Cervical Esophageal Phase WFL Pudding Teaspoon -- Pudding Cup -- Honey Teaspoon -- Honey Cup -- Nectar Teaspoon -- Nectar Cup -- Nectar Straw -- Thin Teaspoon -- Thin Cup -- Thin Straw -- Puree -- Mechanical Soft -- Regular -- Multi-consistency -- Pill -- Cervical Esophageal Comment -- No flowsheet data found. [place] Siren 04/24/2017, 11:48 AM Orbie Pyo Colvin Caroli.Ed CCC-SLP Pager [phone]                  Medical Problem List and Plan: 1.  Debility secondary to Maze procedure for symptomatic mitral regurgitation 04/16/2017 with sternal precautions/multi-medical. Ongoing confusion and expressive language deficits/apraxia  -CT was negative but she is demonstrating symptoms of left cortical infarct  -need to discuss with surgical team feasability of MRI after Maze procedure.  2.  DVT Prophylaxis/Anticoagulation: Coumadin per pharmacy protocol 3. Pain Management: Tylenol as needed 4. Mood: Seroquel 50 mg daily at bedtime. Cranial CT scan negative for acute changes 5. Neuropsych: This patient is capable of making decisions on his own behalf. 6. Skin/Wound Care: Routine skin checks 7. Fluids/Electrolytes/Nutrition: Routine I&O with follow-up chemistries 8.  Dysphagia. Dysphagia 2 thin liquids. Follow-up speech therapy 9. CKD stage III. Followed in the past by Dr. Jimmy Footman Follow-up chemistries 10. Hypertension. Tenormin 50 mg daily 11. Leukocytosis. Improving. Follow-up CBC 12. Hypothyroidism. Synthroid  Post Admission Physician Evaluation: 1. 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 2. Patient is admitted to receive collaborative, interdisciplinary care between the physiatrist, rehab nursing staff, and therapy team. 3. Patient's level of medical complexity and substantial therapy needs in context of that medical necessity cannot be provided at a lesser intensity of care such as a SNF. 4. Patient has experienced substantial functional loss from his/her baseline which was documented above under the "Functional History" and "Functional Status" headings.  Judging by the patient's diagnosis, physical exam, and functional history, the patient has potential for functional progress which will result in measurable gains while on inpatient rehab.  These gains will be of substantial and practical use upon discharge  in facilitating mobility and self-care at the household level. 5. Physiatrist will provide 24 hour management of medical needs as well as oversight of the therapy plan/treatment and provide guidance as appropriate regarding the interaction of the two. 6. The Preadmission Screening  has been reviewed and patient status is unchanged unless otherwise stated above. 7. 24 hour rehab nursing will assist with bladder management, bowel management, safety, skin/wound care, disease management, medication administration, pain management and patient education  and help integrate therapy concepts, techniques,education, etc. 8. PT will assess and treat for/with: Lower extremity strength, range of motion, stamina, balance, functional mobility, safety, adaptive techniques and equipment, NMR, pain  mgt, family ed.   Goals are: supervision to mod I. 9. OT will assess and treat for/with: ADL's, functional mobility, safety, upper extremity strength, adaptive techniques and equipment, NMR, family ed.   Goals are: mod I to supervision. Therapy may proceed with showering this patient. 10. SLP will assess and treat for/with: cognition, communication, language, family ed.  Goals are: supervision to mod I. 11. Case Management and Social Worker will assess and treat for psychological issues and discharge planning. 12. Team conference will be held weekly to assess progress toward goals and to determine barriers to discharge. 13. Patient will receive at least 3 hours of therapy per day at least 5 days per week. 14. ELOS: 7-12 days       15. Prognosis:  good     Meredith Staggers, MD, [facility] 04/26/2017  [name]., PA-C 04/25/2017

## 2017-04-25 NOTE — Progress Notes (Signed)
PT Cancellation Note  Patient Details Name: Amanda [name] MRN: [id number] DOB: 04-21-1941   Cancelled Treatment:    Reason Eval/Treat Not Completed: Other (comment) (Pt ambulating with OT.  Will return tomorrow.  Thanks. )   Amadeo Garnet Olis Viverette 04/25/2017, 12:53 PM Gaetana Kawahara,PT Acute Rehabilitation [phone] (pager)

## 2017-04-25 NOTE — Progress Notes (Signed)
CT surgery  Walked in hall x3 Mental status-confusion improving Paced rhythm tolerating D-3 diet

## 2017-04-25 NOTE — Discharge Instructions (Signed)
Activity: 1.May walk up steps                2.No lifting more than ten pounds for four weeks.                 3.No driving for four weeks.                4.Stop any activity that causes chest pain, shortness of breath, dizziness, sweating or excessive weakness.                5.Avoid straining.                6.Continue with your breathing exercises daily.  Diet: Low fat, Low salt diet  Wound Care: May shower.  Clean wounds with mild soap and water daily. Contact the office at [phone] if any problems arise.   Prediabetes Eating Plan Prediabetes--also called impaired glucose tolerance or impaired fasting glucose--is a condition that causes blood sugar (blood glucose) levels to be higher than normal. Following a healthy diet can help to keep prediabetes under control. It can also help to lower the risk of type 2 diabetes and heart disease, which are increased in people who have prediabetes. Along with regular exercise, a healthy diet:  Promotes weight loss.  Helps to control blood sugar levels.  Helps to improve the way that the body uses insulin.  What do I need to know about this eating plan?  Use the glycemic index (GI) to plan your meals. The index tells you how quickly a food will raise your blood sugar. Choose low-GI foods. These foods take a longer time to raise blood sugar.  Pay close attention to the amount of carbohydrates in the food that you eat. Carbohydrates increase blood sugar levels.  Keep track of how many calories you take in. Eating the right amount of calories will help you to achieve a healthy weight. Losing about 7 percent of your starting weight can help to prevent type 2 diabetes.  You may want to follow a Mediterranean diet. This diet includes a lot of vegetables, lean meats or fish, whole grains, fruits, and healthy oils and fats. What foods can I eat? Grains Whole grains, such as whole-wheat or whole-grain breads, crackers, cereals, and pasta.  Unsweetened oatmeal. Bulgur. Barley. Quinoa. Brown rice. Corn or whole-wheat flour tortillas or taco shells. Vegetables Lettuce. Spinach. Peas. Beets. Cauliflower. Cabbage. Broccoli. Carrots. Tomatoes. Squash. Eggplant. Herbs. Peppers. Onions. Cucumbers. Brussels sprouts. Fruits Berries. Bananas. Apples. Oranges. Grapes. Papaya. Mango. Pomegranate. Kiwi. Grapefruit. Cherries. Meats and Other Protein Sources Seafood. Lean meats, such as chicken and [place] or lean cuts of pork and beef. Tofu. Eggs. Nuts. Beans. Dairy Low-fat or fat-free dairy products, such as yogurt, cottage cheese, and cheese. Beverages Water. Tea. Coffee. Sugar-free or diet soda. Seltzer water. Milk. Milk alternatives, such as soy or almond milk. Condiments Mustard. Relish. Low-fat, low-sugar ketchup. Low-fat, low-sugar barbecue sauce. Low-fat or fat-free mayonnaise. Sweets and Desserts Sugar-free or low-fat pudding. Sugar-free or low-fat ice cream and other frozen treats. Fats and Oils Avocado. Walnuts. Olive oil. The items listed above may not be a complete list of recommended foods or beverages. Contact your dietitian for more options. What foods are not recommended? Grains Refined white flour and flour products, such as bread, pasta, snack foods, and cereals. Beverages Sweetened drinks, such as sweet iced tea and soda. Sweets and Desserts Baked goods, such as cake, cupcakes, pastries, cookies, and cheesecake. The items listed above may not  be a complete list of foods and beverages to avoid. Contact your dietitian for more information. This information is not intended to replace advice given to you by your health care provider. Make sure you discuss any questions you have with your health care provider. Document Released: 11/09/2014 Document Revised: 12/01/2015 Document Reviewed: 07/21/2014 Elsevier Interactive Patient Education  2017 [employer] A What You Need to Know About Warfarin Warfarin is a blood thinner  (anticoagulant). Anticoagulants help to prevent the formation of blood clots. They also help to stop the growth of blood clots. Who should use warfarin? Warfarin is prescribed for people who are at risk for developing harmful blood clots, such as people who have:  Surgically implanted mechanical heart valves.  Irregular heart rhythms (atrial fibrillation).  Certain clotting disorders.  A history of harmful blood clotting in the past. This includes people who have had: ? A stroke. ? Blood clot in the lungs (pulmonary embolism, or PE). ? Blood clot in the legs (deep vein thrombosis, or DVT).  An existing blood clot.  How is warfarin taken?  Warfarin is a medicine that you take by mouth (orally). Warfarin tablets come in different strengths. Each tablet strength is a different color, with the amount of warfarin printed on the tablet. If you get a new prescription filled and the color of your tablet is different than usual, tell your pharmacist or health care provider immediately. What blood tests do I need while taking warfarin? The goal of warfarin therapy is to lessen the clotting tendency of blood, but not to prevent clotting completely. Your health care provider will monitor the anticoagulation effect of warfarin closely and will adjust your dose as needed. Warfarin is a medicine that needs to be closely monitored, so it is very important to keep all lab visits and follow-up visits with your health care provider. While taking warfarin, you will need to have blood tests (prothrombin tests, or PT tests) regularly to measure your blood clotting time. This type of test can be done with a finger stick or a blood draw. What does the INR test result mean? The PT test results will be reported as the International Normalized Ratio (INR). The INR tells your health care provider whether your dosage of warfarin needs to be changed. The longer it takes your blood to clot, the higher the INR. Your health  care provider will tell you your target INR range. If your INR is not in your target range, your health care provider may adjust your dosage.  If your INR is above your target range, there is a risk of bleeding. Your dosage of warfarin may need to be decreased.  If your INR is below your target range, there is a risk of clotting. Your dosage of warfarin may need to be increased.  How often is the INR test needed?  When you first start warfarin, you will usually have your INR checked every few days.  You may need to have INR tests done more than once a week until you are taking the correct dosage of warfarin.  After you have reached your target INR, your INR will be tested less often. However, you will need to have your INR checked at least once every 4-6 weeks for the entire time you are taking warfarin. What are the side effects of warfarin? Too much warfarin can cause bleeding (hemorrhage) in any part of the body, such as:  Bleeding from the gums.  Unexplained bruises.  Bruises that get larger.  Blood in the urine.  Bloody or dark stools.  Bleeding in the brain (hemorrhagic stroke).  A nosebleed that is not easily stopped.  Coughing up blood.  Vomiting blood.  Warfarin use may also cause:  Skin rash or irritations  Nausea that does not go away.  Severe pain in the back or joints.  Painful toes that turn blue or purple (purple toe syndrome).  Painful ulcers that do not go away (skin necrosis).  What are the signs and symptoms of a blood clot? Too little warfarin can increase the risk of blood clots in your legs, lungs, or arms. Signs and symptoms of a DVT in your leg or arm may include:  Pain or swelling in your leg or arm.  Skin that is red or warm to the touch on your arm or leg.  Signs and symptoms of a pulmonary embolism may include:  Shortness of breath or difficulty breathing.  Chest pain.  Unexplained fever.  What are the signs and symptoms of a  stroke? If you are taking too much or too little warfarin, you can have a stroke. Signs and symptoms of a stroke may include:  Weakness or numbness of your face, arm, or leg, especially on one side of your body.  Confusion or trouble thinking clearly.  Difficulty seeing with one or both eyes.  Difficulty walking or moving your arms or legs.  Dizziness.  Loss of balance or coordination.  Trouble speaking, trouble understanding speech, or both (aphasia).  Sudden, severe headache with no known cause.  Partial or total loss of consciousness.  What precautions do I need to take while using warfarin?   Take warfarin exactly as told by your health care provider. Doing this helps you avoid bleeding or blood clots that could result in serious injury, pain, or disability.  Take your medicine at the same time every day. If you forget to take your dose of warfarin, take it as soon as you remember that day. If you do not remember on that day, do not take an extra dose the next day.  Contact your health care provider if you miss or take an extra dose. Do not change your dosage on your own to make up for missed or extra doses.  Wear or carry identification that says that you are taking warfarin.  Make sure that all health care providers, including your dentist, know you are taking warfarin.  If you need surgery, talk with your health care provider about whether you should stop taking warfarin before your surgery.  Avoid situations that cause bleeding. You may bleed more easily while taking warfarin. To limit bleeding, take the following actions: ? Use a softer toothbrush. ? Floss with waxed floss, not unwaxed floss. ? Shave with an electric razor, not with a blade. ? Limit your use of sharp objects. ? Avoid potentially harmful activities, such as contact sports. What do I need to know about warfarin and pregnancy or breastfeeding?  Warfarin is not recommended during the first trimester  of pregnancy due to an increased risk of birth defects. In certain situations, a woman may take warfarin after her first trimester of pregnancy.  If you are taking warfarin and you become pregnant or plan to become pregnant, contact your health care provider right away.  If you plan to breastfeed while taking warfarin, talk with your health care provider first. What do I need to know about warfarin and alcohol or drug use?  Avoid drinking alcohol, or limit alcohol  intake to no more than 1 drink a day for nonpregnant women and 2 drinks a day for men. One drink equals 12 oz of beer, 5 oz of wine, or 1 oz of hard liquor. ? If you change the amount of alcohol that you drink, tell your health care provider. Your warfarin dosage may need to be changed.    Avoid street drugs while taking warfarin. The effects of street drugs on warfarin are not known. What do I need to know about warfarin and other medicines or supplements?  Many prescription and over-the-counter medicines can interfere with warfarin. Talk with your health care provider or your pharmacist before starting or stopping any new medicines. This includes over-the-counter vitamins, dietary supplements, herbal medicines, and pain medicines. Your warfarin dosage may need to be adjusted.  Some common over-the-counter medicines that may increase the risk of bleeding while taking warfarin include: ? Acetaminophen. ?  ? NSAIDs, such as ibuprofen or naproxen. ? Vitamin E. What do I need to know about warfarin and my diet?  It is important to maintain a normal, balanced diet while taking warfarin. Avoid major changes in your diet. If you are going to change your diet, talk with your health care provider before making changes.  Your health care provider may recommend that you work with a diet and nutrition specialist (dietitian).  Vitamin K decreases the effect of warfarin, and it is found in many foods. Eat a consistent amount of foods that  contain vitamin K. For example, you may decide to eat 2 vitamin K-containing foods each day. Most foods that are high in vitamin K are green and leafy. Common foods that contain high amounts of vitamin K include:  Kale, raw or cooked.  Spinach, raw or cooked.  Collards, raw or cooked.  Swiss chard, raw or cooked.  Mustard greens, raw or cooked.  Turnip greens, raw or cooked.  Parsley, raw.  Broccoli, cooked.  Noodles, eggs, and spinach, enriched.  Brussels sprouts, raw or cooked.  Beet greens, raw or cooked.  Endive, raw.  Cabbage, cooked.  Asparagus, cooked.  Foods that contain moderate amounts of vitamin K include:  Broccoli, raw.  Cabbage, raw.  Bok choy, cooked.  Green leaf lettuce, raw  Prunes, stewed.  Rosita Fire.  Kiwi.  Edamame, cooked.  Romaine lettuce, raw.  Avocado.  Tuna, canned in oil.  Okra, cooked.  Black-eyed peas, cooked.  Green beans, cooked or raw.  Blueberries, raw.  Blackberries, raw.  Peas, cooked or raw.  Contact a health care provider if:  You miss a dose.  You take an extra dose.  You plan to have any kind of surgery or procedure.  You are unable to take your medicine due to nausea, vomiting, or diarrhea.  You have any major changes in your diet or you plan to make any major changes in your diet.  You start or stop any over-the-counter medicine, prescription medicine, or dietary supplement.  You become pregnant, plan to become pregnant, or think you may be pregnant.  You have menstrual periods that are heavier than usual.  You have unusual bruising. Get help right away if:  You develop symptoms of an allergic reaction, such as: ? Swelling of the lips, face, tongue, mouth, or throat. ? Rash. ? Itching. ? Itchy, red, swollen areas of skin (hives). ? Trouble breathing. ? Chest tightness.  You have: ? Signs or symptoms of a stroke. ? Signs or symptoms of a blood clot. ? A fall or have an  accident,  especially if you hit your head. ? Blood in your urine. Your urine may look reddish, pinkish, or tea-colored. ? Blood in your stool. Your stool may be black or bright red. ? Bleeding that does not stop after applying pressure to the area for 30 minutes. ? Severe pain in your joints or back. ? Purple or blue toes. ? Skin ulcers that do not go away.  You vomit blood or cough up blood. The blood may be bright red, or it may look like coffee grounds. These symptoms may represent a serious problem that is an emergency. Do not wait to see if the symptoms will go away. Get medical help right away. Call your local emergency services (911 in the [place] Do not drive yourself to the hospital. Summary  Warfarin needs to be closely monitored with blood tests. It is very important to keep all lab visits and follow-up visits with your health care provider.  Make sure that you know your target INR range and your warfarin dosage.  Wear or carry identification that says that you are taking warfarin.  Take warfarin at the same time every day. Call your health care provider if you miss a dose or if you take an extra dose. Do not change the dosage of warfarin on your own.  Know the signs and symptoms of blood clots, bleeding, and a stroke. Know when to get emergency medical help.  Tell all health care providers who care for you that you are taking warfarin.  Talk with your health care provider or your pharmacist before starting or stopping any new medicines.  Monitor how much vitamin K you eat every day. Try to eat the same amount every day. This information is not intended to replace advice given to you by your health care provider. Make sure you discuss any questions you have with your health care provider. Document Released: 06/25/2005 Document Revised: 03/06/2016 Document Reviewed: 09/21/2015 Elsevier Interactive Patient Education  2017 [employer]

## 2017-04-25 NOTE — Evaluation (Signed)
Occupational Therapy Evaluation Patient Details Name: Amanda [name] MRN: [id number] DOB: [birth year] Today's Date: 04/25/2017    History of Present Illness 76 yo female with an extensive pmh including esophageal dysmotility and stricture, s/p balloon dilation (02/19/17), who underwent maze and minimal invasive mitral valve repair 10/9. Subsequently encephalopathic, hypoxic requiring bipap. 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   Clinical Impression   Pt admitted with above. She demonstrates the below listed deficits and will benefit from continued OT to maximize safety and independence with BADLs.  Pt presents to OT with generalized weakness, decreased activity tolerance,  As well as cognitive deficits.  Currently, pt with focused attention and is easily distracted.  She is unable to sustain attention long enough to complete simple ADL tasks, and mod A +2 for functional mobility.  As her cognition improves, anticipate she will make good progress.   Family is very supportive, and I feel she will benefit greatly from the structure and consistency of CIR.  Will follow acutely.       Follow Up Recommendations  CIR;Supervision/Assistance - 24 hour    Equipment Recommendations  None recommended by OT    Recommendations for Other Services Rehab consult     Precautions / Restrictions Precautions Precautions: Fall Restrictions Weight Bearing Restrictions: No      Mobility Bed Mobility               General bed mobility comments: Pt sitting up in the chair   Transfers Overall transfer level: Needs assistance Equipment used: Rolling walker (2 wheeled) (EVA walker ) Transfers: Sit to/from [employer] Sit to Stand: Mod assist;+2 physical assistance Stand pivot transfers: Mod assist;+2 physical assistance       General transfer comment: assist to move into  standing, assist for balance and assist for walker safety     Balance Overall balance assessment: Needs assistance Sitting-balance support: Feet supported Sitting balance-Leahy Scale: Poor Sitting balance - Comments: Pt requires min guard to min A  Postural control: Posterior lean Standing balance support: Bilateral upper extremity supported Standing balance-Leahy Scale: Poor Standing balance comment: Pt reliant on bil. UE support and mod A                            ADL either performed or assessed with clinical judgement   ADL Overall ADL's : Needs assistance/impaired Eating/Feeding: Total assistance;Sitting   Grooming: Wash/dry hands;Wash/dry face;Oral care;Brushing hair;Total assistance;Sitting   Upper Body Bathing: Total assistance;Sitting   Lower Body Bathing: Total assistance;Sit to/from stand   Upper Body Dressing : Total assistance;Sitting   Lower Body Dressing: Total assistance;Sit to/from stand   Toilet Transfer: Moderate assistance;+2 for physical assistance;RW;BSC;Grab bars;Comfort height toilet;Ambulation   Toileting- Clothing Manipulation and Hygiene: Total assistance;Sit to/from stand       Functional mobility during ADLs: Moderate assistance;+2 for physical assistance;Rolling walker (EVA walker ) General ADL Comments: Pt requires assist due to severity of cognitive deficits and currently with focused attention.  She is able to engage briefly in ADL tasks, but becomes distracted and unable to complete      Vision         Perception     Praxis      Pertinent Vitals/Pain Pain Assessment: No/denies pain     Hand Dominance Right   Extremity/Trunk Assessment Upper Extremity Assessment Upper Extremity Assessment: Generalized weakness   Lower Extremity Assessment  Lower Extremity Assessment: Defer to PT evaluation   Cervical / Trunk Assessment Cervical / Trunk Assessment: Other exceptions   Communication Communication Communication: No  difficulties   Cognition Arousal/Alertness: Awake/alert;Lethargic Behavior During Therapy: Flat affect Overall Cognitive Status: Impaired/Different from baseline Area of Impairment: Attention;Memory;Following commands;Safety/judgement;Problem solving                   Current Attention Level: Focused   Following Commands: Follows one step commands inconsistently;Follows one step commands with increased time Safety/Judgement: Decreased awareness of safety   Problem Solving: Slow processing;Decreased initiation;Difficulty sequencing;Requires verbal cues;Requires tactile cues     General Comments  VSS     Exercises     Shoulder Instructions      Home Living Family/patient expects to be discharged to:: Inpatient rehab                                 Additional Comments: pt was living at home with spouse in 1 story home with 3 STE, walk in shower with seat in it      Prior Functioning/Environment Level of Independence: Independent        Comments: driving, dressing, bathing, walking without AD        OT Problem List: Decreased strength;Decreased activity tolerance;Impaired balance (sitting and/or standing);Decreased cognition;Decreased safety awareness;Decreased coordination;Decreased knowledge of use of DME or AE      OT Treatment/Interventions: Self-care/ADL training;Therapeutic exercise;DME and/or AE instruction;Energy conservation;Therapeutic activities;Cognitive remediation/compensation;Patient/family education;Balance training    OT Goals(Current goals can be found in the care plan section) Acute Rehab OT Goals OT Goal Formulation: With family Time For Goal Achievement: 05/09/17 Potential to Achieve Goals: Good ADL Goals Pt Will Perform Eating: with set-up;with supervision;sitting Pt Will Perform Grooming: with min assist;standing Pt Will Perform Upper Body Bathing: with set-up;with supervision Pt Will Perform Lower Body Bathing: with min  assist;sit to/from stand Pt Will Perform Upper Body Dressing: with set-up;with supervision;sitting Pt Will Perform Lower Body Dressing: with min assist;sit to/from stand Pt Will Transfer to Toilet: with min assist;ambulating;regular height toilet;bedside commode;grab bars Pt Will Perform Toileting - Clothing Manipulation and hygiene: with min assist;sit to/from stand Additional ADL Goal #1: Pt will sustain attention to simple ADL activities x 5 mins with no cues   OT Frequency: Min 2X/week   Barriers to D/C:            Co-evaluation              AM-PAC PT "6 Clicks" Daily Activity     Outcome Measure Help from another person eating meals?: Total Help from another person taking care of personal grooming?: Total Help from another person toileting, which includes using toliet, bedpan, or urinal?: Total Help from another person bathing (including washing, rinsing, drying)?: Total Help from another person to put on and taking off regular upper body clothing?: Total Help from another person to put on and taking off regular lower body clothing?: Total 6 Click Score: 6   End of Session Equipment Utilized During Treatment: Rolling walker (EVA walker ) Nurse Communication: Mobility status  Activity Tolerance: Patient tolerated treatment well Patient left: in chair;with call bell/phone within reach;with nursing/sitter in room;with family/visitor present  OT Visit Diagnosis: Unsteadiness on feet (R26.81);Cognitive communication deficit (R41.841)                Time: [id number] OT Time Calculation (min): 55 min Charges:  OT General Charges $OT Visit: 1  Visit OT Evaluation $OT Eval Moderate Complexity: 1 Mod OT Treatments $Therapeutic Activity: 38-52 mins G-Codes:     [employer], OTR/L [phone]   Jeani Hawking M 04/25/2017, 3:29 PM

## 2017-04-25 NOTE — Progress Notes (Addendum)
  Speech Language Pathology Treatment: Dysphagia  Patient Details Name: Amanda [name] MRN: [id number] DOB: [birth year] Today's Date: 04/25/2017 Time: 2130-8657 SLP Time Calculation (min) (ACUTE ONLY): 10 min  Assessment / Plan / Recommendation Clinical Impression  Pt lethargic after just completing walk with RN. Immediate cough following thin via straw likely due to inadequate alertness. RN reported pt consumed Dys 2 texture last night without s/s aspiration or pocketing however, RN found pt to pocket whole pills this am, therefore suggest crushing meds. Unable to observe with solids this am. Pt's waxing and waning alertness has been a barrier to determining a consistently efficient diet for pt to masticate and propel. Continue Dys 2 and thin.   HPI HPI: 76 yo female with an extensive pmh including esophageal dysmotility and stricture, s/p balloon dilation (02/19/17), who underwent maze and minimal invasive mitral valve repair 10/9. Subsequently encephalopathic, hypoxic requiring bipap. 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 Referred for swallowing evaluation. MBS recommended to fully assess pharyngeal phase swallow given consistent throat clear with thin and thick consistencies.       SLP Plan  Continue with current plan of care       Recommendations  Diet recommendations: Dysphagia 3 (mechanical soft);Thin liquid Liquids provided via: Cup;No straw Medication Administration: Crushed with puree Supervision: Full supervision/cueing for compensatory strategies;Patient able to self feed Compensations: Slow rate;Small sips/bites;Minimize environmental distractions Postural Changes and/or Swallow Maneuvers: Seated upright 90 degrees                General recommendations: Rehab consult Oral Care Recommendations: Oral care BID Follow up Recommendations: Inpatient Rehab SLP Visit  Diagnosis: Dysphagia, unspecified (R13.10) Plan: Continue with current plan of care       GO                [name] 04/25/2017, 10:03 AM  [name] Lonell Face.Ed [employer] [phone]

## 2017-04-25 NOTE — Progress Notes (Signed)
Nutrition Follow-up  DOCUMENTATION CODES:   Not applicable  INTERVENTION:   -Continue Magic Cup TID with meals, each supplement provides 290 kcal and 9 grams of protein  -Continue Ensure Enlive po BID, each supplement provides 350 kcal and 20 grams of protein  NUTRITION DIAGNOSIS:   Inadequate oral intake related to  (decreased appetite) as evidenced by per patient/family report, meal completion < 50%. Ongoing  GOAL:   Patient will meet greater than or equal to 90% of their needs Progressing  MONITOR:   I & O's, Diet advancement, PO intake, Supplement acceptance     ASSESSMENT:   Pt with PMH of HTN, CKD stage III, heart block s/p pacemater, afib, esophageal dysmotility s/p EGD with esophageal dilatation, severe MV prolapse admitted for MV repair and MAZE procedure on 10/9. Pt extubated 10/10   10/14- Dysphagia 1 with nectar thick liquids ordered 10/16- TPN stopped 10/17- Modified Barium Swallow Completed and Dysphagia 2 with thin liquids ordered  Pts daughter was at bedside and answered questions as pt seemed confused. Daughter reports that pt has been eating small amounts of applesauce and fruit and none of her entrees such as [place] and gravy. Pt has been drinking water, tea, juice, and a little bit of Ensure. Pt likes Ensure and Magic Cup and is tolerating it well. Daughter reports that pt is still experiencing a sore throat but has not had issues chewing or swallowing Dysphagia 2 items. Per chart, pt ate 10% of breakfast today and 15% of lunch yesterday.  Medications: Synthroid, Coumadin, Lovenox, Protonix  Labs: CBG's: 101-90-121; Na 151 (H); K 3 (L); Ca 1.74 (H)  IVF: D5 LR with KCl 40 mEq/L @ 100 mL/hr  Diet Order:  DIET DYS 2 Room service appropriate? Yes; Fluid consistency: Thin  Skin:   (Incisions)  Last BM:  04/25/17  Height:   Ht Readings from Last 1 Encounters:  04/16/17 5' 6.5" (1.689 m)    Weight:   Wt Readings from Last 1 Encounters:  04/25/17  129 lb (58.5 kg)    Ideal Body Weight:  60.2 kg  BMI:  Body mass index is 20.51 kg/m.  Estimated Nutritional Needs:   Kcal:  1750-1900  Protein:  80-95 grams  Fluid:  Per MD  EDUCATION NEEDS:   No education needs identified at this time  Wynetta Emery [facility] Dietetic Intern Pager: [phone] 04/25/2017 4:20 PM

## 2017-04-25 NOTE — Progress Notes (Addendum)
      [address]       Amanda [name] [id number]             [phone]        CARDIOTHORACIC SURGERY PROGRESS NOTE  R9Days Post-OpS/P Procedure(s) (LRB): MINIMALLY INVASIVE MITRAL VALVE REPAIR (MVR) (Right) MINIMALLY INVASIVE MAZE PROCEDURE (N/A) TRANSESOPHAGEAL ECHOCARDIOGRAM (TEE) (N/A) CLIPPING OF ATRIAL APPENDAGE using AtriCure Pro2 clip 45   R9 Days Post-Op Procedure(s) (LRB): RE-EXPLORATION RIGHT THORACOTOMY FOR BLEEDING (Right)  Subjective: No complaints.  Alert and oriented x2.  Still somewhat confused.  Reportedly slept some last night after Seroquel  Objective: Vital signs: BP Readings from Last 1 Encounters:  04/25/17 (!) 116/56   Pulse Readings from Last 1 Encounters:  04/23/17 70   Resp Readings from Last 1 Encounters:  04/24/17 18   Temp Readings from Last 1 Encounters:  04/25/17 98 F (36.7 C) (Oral)    Hemodynamics:    Physical Exam:  Rhythm:   AV paced  Breath sounds: clear  Heart sounds:  RRR w/out murmur  Incisions:  Clean and dry  Abdomen:  Soft, non-distended, non-tender  Extremities:  Warm, well-perfused    Intake/Output from previous day: 10/17 0701 - 10/18 0700 In: 10 [I.V.:10] Out: -  Intake/Output this shift: No intake/output data recorded.  Lab Results:  CBC: Recent Labs  04/24/17 0212 04/25/17 0338  WBC 21.6* 17.2*  HGB 14.9 12.1  HCT 46.4* 39.1  PLT 68* 85*    BMET:  Recent Labs  04/24/17 0212 04/25/17 0338  NA 149* 151*  K 4.2 3.0*  CL 108 109  CO2 28 33*  GLUCOSE 99 121*  BUN 77* 77*  CREATININE 1.53* 1.74*  CALCIUM 9.5 9.0     PT/INR:   Recent Labs  04/25/17 0338  LABPROT 19.7*  INR 1.69    CBG (last 3)   Recent Labs  04/24/17 1624 04/24/17 2153 04/25/17 0754  GLUCAP 79 101* 90    ABG    Component Value Date/Time   PHART 7.520 (H) 04/19/2017 2132   PCO2ART 37.9 04/19/2017 2132   PO2ART 69.7 (L) 04/19/2017 2132   HCO3 30.8 (H) 04/19/2017 2132   TCO2 22  04/17/2017 1912   ACIDBASEDEF 1.3 04/18/2017 0505   O2SAT 94.5 04/19/2017 2132    CXR: n/a  Assessment/Plan:  Overall stable POD9 Maintaining AV paced rhythm w/ stable BP Breathing comfortably w/ O2 sats 95% on RA Postop delirium resolving Elevated serum creatinine, likely due to prerenal azotemia +/- acute kidney injury caused by ATN Hypernatremia likely due to dehydration, weight now below preop baseline, probably relatively dehydrated Hypokalemia, induced by loop diuretics, contraction alkylosis Leukocytosis w/out fever, likely reactive, trending down INR trending up on Coumadin   Encourage oraldiet  Gentle IV hydration w/ potassium and f/u sodium, potassium  Continue to hold diuretics  Mobilize  PT  Decrease Coumadin dose  Purcell Nails, MD 04/25/2017 9:09 AM

## 2017-04-25 NOTE — Care Management Note (Signed)
Case Management Note  Patient Details  Name: Amanda [name] MRN: [id number] Date of Birth: Sep 06, 1940  Subjective/Objective:      Pt is s/p MAZE - required precedex post for delirum              Action/Plan:  Pt is from home with spouse.   Pt has sitter, on venti mask.  No family at bedside.  CM will continue to follow for discharge needs   Expected Discharge Date:                  Expected Discharge Plan:     In-House Referral:     Discharge planning Services     Post Acute Care Choice:    Choice offered to:     DME Arranged:    DME Agency:     HH Arranged:    HH Agency:     Status of Service:     If discussed at [employer] of Stay Meetings, dates discussed:    Additional Comments: 04/25/2017  TPN discontinued yesterday and pt was started on dysphagia diet.  Plan continues to be CIR with SNF back up.  CM will continue to follow for discharge needs   04/23/17 CM requested CIR rehab consult per PT recommendation.  Currently per PT notes pt is not fully able to participate in therapy - CSW consulted for back up plan Cherylann Parr, RN 04/25/2017, 9:43 AM

## 2017-04-25 NOTE — Progress Notes (Signed)
Rehab admissions - I met with patient, her daughter and her husband at the bedside.  Patient is confused/hallucinating today.  Dtr would like inpatient rehab prior to home.  Awaiting PT/OT update/eval before I can open and send case to [facility].  Will follow along for updates.  Call me for questions.  #[phone]

## 2017-04-26 ENCOUNTER — Encounter: Payer: Self-pay | Admitting: Cardiology

## 2017-04-26 ENCOUNTER — Encounter (HOSPITAL_COMMUNITY): Payer: Self-pay

## 2017-04-26 ENCOUNTER — Inpatient Hospital Stay (HOSPITAL_COMMUNITY)
Admission: RE | Admit: 2017-04-26 | Discharge: 2017-05-04 | DRG: 949 | Disposition: A | Discharge status: Home Health | Payer: Medicare Other | Source: Intra-hospital | Attending: Physical Medicine & Rehabilitation | Admitting: Physical Medicine & Rehabilitation

## 2017-04-26 DIAGNOSIS — R1310 Dysphagia, unspecified: Secondary | ICD-10-CM | POA: Diagnosis present

## 2017-04-26 DIAGNOSIS — I10 Essential (primary) hypertension: Secondary | ICD-10-CM

## 2017-04-26 DIAGNOSIS — R0602 Shortness of breath: Secondary | ICD-10-CM

## 2017-04-26 DIAGNOSIS — R4189 Other symptoms and signs involving cognitive functions and awareness: Secondary | ICD-10-CM | POA: Diagnosis present

## 2017-04-26 DIAGNOSIS — R0789 Other chest pain: Secondary | ICD-10-CM | POA: Diagnosis not present

## 2017-04-26 DIAGNOSIS — Z5181 Encounter for therapeutic drug level monitoring: Secondary | ICD-10-CM

## 2017-04-26 DIAGNOSIS — Z7901 Long term (current) use of anticoagulants: Secondary | ICD-10-CM

## 2017-04-26 DIAGNOSIS — Z48812 Encounter for surgical aftercare following surgery on the circulatory system: Secondary | ICD-10-CM | POA: Diagnosis not present

## 2017-04-26 DIAGNOSIS — J90 Pleural effusion, not elsewhere classified: Secondary | ICD-10-CM | POA: Diagnosis present

## 2017-04-26 DIAGNOSIS — Z8249 Family history of ischemic heart disease and other diseases of the circulatory system: Secondary | ICD-10-CM | POA: Diagnosis not present

## 2017-04-26 DIAGNOSIS — J939 Pneumothorax, unspecified: Secondary | ICD-10-CM | POA: Diagnosis present

## 2017-04-26 DIAGNOSIS — Z8041 Family history of malignant neoplasm of ovary: Secondary | ICD-10-CM

## 2017-04-26 DIAGNOSIS — R5381 Other malaise: Secondary | ICD-10-CM | POA: Diagnosis present

## 2017-04-26 DIAGNOSIS — Z882 Allergy status to sulfonamides status: Secondary | ICD-10-CM | POA: Diagnosis not present

## 2017-04-26 DIAGNOSIS — Z8673 Personal history of transient ischemic attack (TIA), and cerebral infarction without residual deficits: Secondary | ICD-10-CM

## 2017-04-26 DIAGNOSIS — N183 Chronic kidney disease, stage 3 unspecified: Secondary | ICD-10-CM

## 2017-04-26 DIAGNOSIS — I480 Paroxysmal atrial fibrillation: Secondary | ICD-10-CM | POA: Diagnosis present

## 2017-04-26 DIAGNOSIS — Z91041 Radiographic dye allergy status: Secondary | ICD-10-CM

## 2017-04-26 DIAGNOSIS — D62 Acute posthemorrhagic anemia: Secondary | ICD-10-CM | POA: Diagnosis present

## 2017-04-26 DIAGNOSIS — B370 Candidal stomatitis: Secondary | ICD-10-CM | POA: Diagnosis present

## 2017-04-26 DIAGNOSIS — Z79890 Hormone replacement therapy (postmenopausal): Secondary | ICD-10-CM

## 2017-04-26 DIAGNOSIS — Z87891 Personal history of nicotine dependence: Secondary | ICD-10-CM | POA: Diagnosis not present

## 2017-04-26 DIAGNOSIS — E785 Hyperlipidemia, unspecified: Secondary | ICD-10-CM | POA: Diagnosis present

## 2017-04-26 DIAGNOSIS — Z823 Family history of stroke: Secondary | ICD-10-CM

## 2017-04-26 DIAGNOSIS — Z881 Allergy status to other antibiotic agents status: Secondary | ICD-10-CM

## 2017-04-26 DIAGNOSIS — E039 Hypothyroidism, unspecified: Secondary | ICD-10-CM | POA: Diagnosis present

## 2017-04-26 DIAGNOSIS — R482 Apraxia: Secondary | ICD-10-CM | POA: Diagnosis present

## 2017-04-26 DIAGNOSIS — I129 Hypertensive chronic kidney disease with stage 1 through stage 4 chronic kidney disease, or unspecified chronic kidney disease: Secondary | ICD-10-CM | POA: Diagnosis present

## 2017-04-26 DIAGNOSIS — Z808 Family history of malignant neoplasm of other organs or systems: Secondary | ICD-10-CM

## 2017-04-26 DIAGNOSIS — G9340 Encephalopathy, unspecified: Secondary | ICD-10-CM | POA: Diagnosis present

## 2017-04-26 DIAGNOSIS — E876 Hypokalemia: Secondary | ICD-10-CM | POA: Diagnosis present

## 2017-04-26 DIAGNOSIS — Z8261 Family history of arthritis: Secondary | ICD-10-CM

## 2017-04-26 LAB — CBC
HEMATOCRIT: 36.5 % (ref 36.0–46.0)
Hemoglobin: 11.2 g/dL — ABNORMAL LOW (ref 12.0–15.0)
MCH: 28.9 pg (ref 26.0–34.0)
MCHC: 30.7 g/dL (ref 30.0–36.0)
MCV: 94.1 fL (ref 78.0–100.0)
Platelets: 100 10*3/uL — ABNORMAL LOW (ref 150–400)
RBC: 3.88 MIL/uL (ref 3.87–5.11)
RDW: 17.3 % — ABNORMAL HIGH (ref 11.5–15.5)
WBC: 16.1 10*3/uL — AB (ref 4.0–10.5)

## 2017-04-26 LAB — COMPREHENSIVE METABOLIC PANEL
ALT: 77 U/L — ABNORMAL HIGH (ref 14–54)
AST: 57 U/L — ABNORMAL HIGH (ref 15–41)
Albumin: 2.4 g/dL — ABNORMAL LOW (ref 3.5–5.0)
Alkaline Phosphatase: 132 U/L — ABNORMAL HIGH (ref 38–126)
Anion gap: 6 (ref 5–15)
BUN: 62 mg/dL — ABNORMAL HIGH (ref 6–20)
CO2: 28 mmol/L (ref 22–32)
Calcium: 8.5 mg/dL — ABNORMAL LOW (ref 8.9–10.3)
Chloride: 115 mmol/L — ABNORMAL HIGH (ref 101–111)
Creatinine, Ser: 1.71 mg/dL — ABNORMAL HIGH (ref 0.44–1.00)
GFR calc Af Amer: 32 mL/min — ABNORMAL LOW (ref >60)
GFR calc non Af Amer: 27 mL/min — ABNORMAL LOW (ref >60)
Glucose, Bld: 153 mg/dL — ABNORMAL HIGH (ref 65–99)
Potassium: 3.8 mmol/L (ref 3.5–5.1)
Sodium: 149 mmol/L — ABNORMAL HIGH (ref 135–145)
Total Bilirubin: 2.1 mg/dL — ABNORMAL HIGH (ref 0.3–1.2)
Total Protein: 4.8 g/dL — ABNORMAL LOW (ref 6.5–8.1)

## 2017-04-26 LAB — GLUCOSE, CAPILLARY
GLUCOSE-CAPILLARY: 87 mg/dL (ref 65–99)
GLUCOSE-CAPILLARY: 54 mg/dL — AB (ref 65–99)
Glucose-Capillary: 108 mg/dL — ABNORMAL HIGH (ref 65–99)
Glucose-Capillary: 106 mg/dL — ABNORMAL HIGH (ref 65–99)

## 2017-04-26 LAB — PROTIME-INR
INR: 2.04
Prothrombin Time: 22.9 seconds — ABNORMAL HIGH (ref 11.4–15.2)

## 2017-04-26 LAB — PREALBUMIN: Prealbumin: 21.4 mg/dL (ref 18–38)

## 2017-04-26 MED ORDER — RESOURCE THICKENUP CLEAR PO POWD: ORAL | Status: DC

## 2017-04-26 MED ORDER — ATENOLOL 50 MG PO TABS
50.0000 mg | ORAL_TABLET | Freq: Every day | ORAL | Status: DC
  Administered 2017-04-27 – 2017-05-04 (×8): 50 mg via ORAL
  Filled 2017-04-26 (×8): qty 1

## 2017-04-26 MED ORDER — QUETIAPINE FUMARATE 50 MG PO TABS: 50.0000 mg | ORAL_TABLET | Freq: Every day | ORAL | Status: DC

## 2017-04-26 MED ORDER — PANTOPRAZOLE SODIUM 40 MG PO TBEC
40.0000 mg | DELAYED_RELEASE_TABLET | Freq: Every day | ORAL | Status: DC
  Administered 2017-04-27 – 2017-05-04 (×8): 40 mg via ORAL
  Filled 2017-04-26 (×8): qty 1

## 2017-04-26 MED ORDER — WARFARIN - PHARMACIST DOSING INPATIENT
Freq: Every day | Status: DC
  Administered 2017-04-29 – 2017-05-03 (×3)

## 2017-04-26 MED ORDER — WARFARIN SODIUM 1 MG PO TABS
1.0000 mg | ORAL_TABLET | Freq: Once | ORAL | Status: DC
  Filled 2017-04-26: qty 1

## 2017-04-26 MED ORDER — POTASSIUM CHLORIDE 2 MEQ/ML IV SOLN
INTRAVENOUS | Status: DC
  Administered 2017-04-26: 08:00:00 via INTRAVENOUS
  Filled 2017-04-26 (×2): qty 1000

## 2017-04-26 MED ORDER — LEVOTHYROXINE SODIUM 75 MCG PO TABS
75.0000 ug | ORAL_TABLET | Freq: Every day | ORAL | Status: DC
  Administered 2017-04-27 – 2017-05-04 (×8): 75 ug via ORAL
  Filled 2017-04-26 (×8): qty 1

## 2017-04-26 MED ORDER — WARFARIN SODIUM 1 MG PO TABS: 1.0000 mg | ORAL_TABLET | Freq: Every day | ORAL | Status: DC

## 2017-04-26 MED ORDER — QUETIAPINE FUMARATE 50 MG PO TABS
50.0000 mg | ORAL_TABLET | Freq: Every day | ORAL | Status: DC
  Administered 2017-04-26 – 2017-04-29 (×4): 50 mg via ORAL
  Filled 2017-04-26 (×4): qty 1

## 2017-04-26 NOTE — Progress Notes (Signed)
Patients Blood sugar dropped to 54. Gave her two can of apple scarce. Fed her 40% of her lunch per family. Rechecked an hour later and CBG was 104. Patient still appear very confused, but has been calm most of the day in the presence of her family. Will transfer patient for rehab, per MD.

## 2017-04-26 NOTE — Progress Notes (Signed)
Physical Therapy Treatment Patient Details Name: Amanda ShoalsLoretta A Townsend MRN: [id number] DOB: 01/05/1941 Today's Date: 04/26/2017    History of Present Illness 76 yo female with an extensive pmh including esophageal dysmotility and stricture, s/p balloon dilation (02/19/17), who underwent maze and minimal invasive mitral valve repair 10/9. Subsequently encephalopathic, hypoxic requiring bipap. 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    PT Comments    Pt admitted with above diagnosis. Pt currently with functional limitations due to balance and endurance deficits. Pt was unable to ambulate today as pt confused and adamant she couldn't walk even though she tried briefly.  Spent incr time trying to get pt to try but ultimately pt would not walk more than 5 feet.  Positioned in chair well.  Pt will benefit from skilled PT to increase their independence and safety with mobility to allow discharge to the venue listed below.     Follow Up Recommendations  CIR     Equipment Recommendations   (TBD)    Recommendations for Other Services Rehab consult     Precautions / Restrictions Precautions Precautions: Fall Restrictions Weight Bearing Restrictions: No Other Position/Activity Restrictions: sternal precautions    Mobility  Bed Mobility               General bed mobility comments: Pt sitting up in the chair.  On arrival, pt slid down in chair.  Had to use max assist to get her positioned well.  Of note, took geomat cushion out at end of treaztment as feel that it was causing pt to slide out.   Transfers Overall transfer level: Needs assistance Equipment used:  (EVA walker ) Transfers: Sit to/from Stand Sit to Stand: Mod assist;+2 physical assistance;Max assist         General transfer comment: assist to move into standing, assist for balance and assist for walker safety.  constant cues  and support to maintain standing.   Ambulation/Gait Ambulation/Gait assistance: +2 physical assistance;Mod assist;Max assist (3rd person for chair follow) Ambulation Distance (Feet): 5 Feet Assistive device:  (EVa walker) Gait Pattern/deviations: Step-through pattern;Decreased stride length;Trunk flexed;Wide base of support;Drifts right/left;Decreased step length - right;Decreased step length - left   Gait velocity interpretation: Below normal speed for age/gender General Gait Details: Pt was able to use Eva walker with constant cues to stay close toEva walker as well as cue for upright posture.  Pt needed contant cues to sequence steps and Carley Hammedva walker. However today even with encouragement, pt would not ambulate over 5 feet.  Pt was confused and feel that this limited her.  3rd person for chair folllow.    Stairs            Wheelchair Mobility    Modified Rankin (Stroke Patients Only)       Balance         Postural control: Posterior lean Standing balance support: Bilateral upper extremity supported Standing balance-Leahy Scale: Poor Standing balance comment: Pt reliant on bil. UE support and mod A                             Cognition Arousal/Alertness: Awake/alert;Lethargic Behavior During Therapy: Flat affect Overall Cognitive Status: Impaired/Different from baseline Area of Impairment: Attention;Memory;Following commands;Safety/judgement;Problem solving                   Current Attention Level: Focused   Following Commands: Follows  one step commands inconsistently;Follows one step commands with increased time Safety/Judgement: Decreased awareness of safety   Problem Solving: Slow processing;Decreased initiation;Difficulty sequencing;Requires verbal cues;Requires tactile cues General Comments: pt with lethargy/fatigue and had difficulty follow commands       Exercises      General Comments General comments (skin integrity, edema, etc.):  VSS.  Daughter present and was trying to encourage pt but pt was adamant she could not walk.  Pt was somewhat lethargic as well.       Pertinent Vitals/Pain Pain Assessment: Faces Faces Pain Scale: Hurts even more Pain Location: bil sides per pt Pain Descriptors / Indicators: Aching;Grimacing;Guarding Pain Intervention(s): Limited activity within patient's tolerance;Monitored during session;Repositioned   VSS Home Living                      Prior Function            PT Goals (current goals can now be found in the care plan section) Progress towards PT goals: Progressing toward goals    Frequency    Min 3X/week      PT Plan Current plan remains appropriate    Co-evaluation              AM-PAC PT "6 Clicks" Daily Activity  Outcome Measure  Difficulty turning over in bed (including adjusting bedclothes, sheets and blankets)?: Unable Difficulty moving from lying on back to sitting on the side of the bed? : Unable Difficulty sitting down on and standing up from a chair with arms (e.g., wheelchair, bedside commode, etc,.)?: A Lot Help needed moving to and from a bed to chair (including a wheelchair)?: A Lot Help needed walking in hospital room?: A Lot Help needed climbing 3-5 steps with a railing? : Total 6 Click Score: 9    End of Session Equipment Utilized During Treatment: Gait belt Activity Tolerance: Patient limited by fatigue Patient left: with call bell/phone within reach;in chair;with family/visitor present Nurse Communication: Mobility status PT Visit Diagnosis: Unsteadiness on feet (R26.81);Muscle weakness (generalized) (M62.81);Difficulty in walking, not elsewhere classified (R26.2)     Time: 1751-0258 PT Time Calculation (min) (ACUTE ONLY): 25 min  Charges:  $Gait Training: 23-37 mins                    G Codes:       Kareema Keitt,PT Acute Rehabilitation [phone]   [name] 04/26/2017, 1:45 PM

## 2017-04-26 NOTE — Care Management (Signed)
Pt to discharge to CIR

## 2017-04-26 NOTE — H&P (Signed)
Physical Medicine and Rehabilitation Admission H&P  Chief complaint: Weakness  HPI: Amanda [name] is a 76 y.o. right handed female with history of mitral valve prolapse and mitral regurgitation, CVA, hypertension, stage III CKD, heart block with pacemaker, PAF on long-term anticoagulation/Eliquis. Patient lives with spouse. Reportedly independent and driving prior to admission. Presented 04/15/2017 for evaluation and treatment of symptomatic mitral regurgitation. Recent echocardiogram with normal left ventricular systolic function mild to moderate mitral regurgitation. Follow-up echo showed significant worsening in severity of mitral regurgitation. Underwent Maze procedure 04/16/2017 per Dr. Cornelius Moras. Postoperatively patient with 150-250 mL of chest tube output per hour and underwent reexploration of right mini thoracotomy for any bleeding same date 04/16/2017 with no findings of reaccumulated blood with ongoing bleeding Hospital course pain management. Sternal precautions as directed.  Hospital course leukocytosis 21,600 and monitored with latest of 17,200 . Intermittent bouts of confusion and restlessness. Cranial CT scan reviewed, unremarkable for acute process. Subcutaneous Lovenox for DVT prophylaxis and transitioned to Coumadin. Initially maintained on TPN for nutritional support and diet advanced to a dysphagia #2 thin liquid diet Physical and occupational therapy evaluation completed with recommendations of physical medicine rehabilitation consult. Patient was admitted for a comprehensive rehabilitation program  Review of Systems  Constitutional: Negative for chills and fever.  HENT: Negative for hearing loss and tinnitus.  Eyes: Negative for blurred vision and double vision.  Respiratory: Positive for shortness of breath. Negative for cough.  Cardiovascular: Positive for palpitations and leg swelling.  Gastrointestinal: Positive for constipation. Negative for nausea and vomiting.    Genitourinary: Positive for urgency.  Musculoskeletal: Positive for joint pain and myalgias.  Neurological: Positive for weakness. Negative for seizures.  All other systems reviewed and are negative.       Past Medical History:  Diagnosis Date  . Allergic rhinitis   . Anemia   . CKD (chronic kidney disease), stage III (HCC)    stage III  . DJD (degenerative joint disease)   . Glomerulonephritis    Dr [name]  . Hyperlipidemia   . Hyperparathyroidism (HCC)   . Hypertension   . Hypothyroidism   . IBS (irritable bowel syndrome)   . Interstitial cystitis   . Mitral regurgitation   . Mobitz II    a. s/p STJ dual chamber PPM   . MVP (mitral valve prolapse)    moderate posterior MVP with moderate MR and grade II diasotlic dysfunction  . PAC (premature atrial contraction)   . PAF (paroxysmal atrial fibrillation) (HCC)    note on pacer check. CHADS2VASC score is 4 now on Eliquis.  . Paroxysmal atrial fibrillation (HCC)    CHADS2VASC score is 4  . PVC's (premature ventricular contractions)   . S/P minimally invasive maze operation for atrial fibrillation 04/16/2017   Complete bilateral atrial lesion set using cryothermy and bipolar radiofrequency ablation with clipping of LA appendage via right mini thoracotomy approach  . S/P minimally invasive mitral valve repair 04/16/2017   Complex valvuloplasty including triangular resection of posterior leaflet, artificial Gore-tex neochords x6 and Sorin Memo 3D ring annuloplasty ([id number], size 32, serial # [id number])  . S/P placement of cardiac pacemaker   . Small vessel disease, cerebrovascular         Past Surgical History:  Procedure Laterality Date  . ABDOMINAL HYSTERECTOMY    . BREAST BIOPSY Right   . CLIPPING OF ATRIAL APPENDAGE  04/16/2017   Procedure: CLIPPING OF ATRIAL APPENDAGE using AtriCure Pro2 clip 45; Surgeon: Purcell Nails, MD; Location: [facility] OR;  Service: Open Heart Surgery;;  . CYSTOSTOMY W/ BLADDER BIOPSY    . EP  IMPLANTABLE DEVICE N/A 01/07/2015   STJ dual chamber pacemaker implanted by Dr Ladona Ridgelaylor for 2:1 heart block  . MINIMALLY INVASIVE MAZE PROCEDURE N/A 04/16/2017   Procedure: MINIMALLY INVASIVE MAZE PROCEDURE; Surgeon: Purcell Nailswen, Clarence H, MD; [facility]; Service: Open Heart Surgery; Laterality: N/A;  . MITRAL VALVE REPAIR Right 04/16/2017   Procedure: MINIMALLY INVASIVE MITRAL VALVE REPAIR (MVR); Surgeon: Purcell Nailswen, Clarence H, MD; [facility]; Service: Open Heart Surgery; Laterality: Right;  . MITRAL VALVE REPAIR Right 04/16/2017   Procedure: RE-EXPLORATION RIGHT THORACOTOMY FOR BLEEDING; Surgeon: Purcell Nailswen, Clarence H, MD; [facility]; Service: Open Heart Surgery; Laterality: Right;  . PERIPHERAL VASCULAR CATHETERIZATION N/A 02/03/2015   Procedure: Upper Extremity Venography; Surgeon: Duke SalviaSteven C Klein, MD; [facility]; Service: Cardiovascular; Laterality: N/A;  . RIGHT/LEFT HEART CATH AND CORONARY ANGIOGRAPHY N/A 03/26/2017   Procedure: RIGHT/LEFT HEART CATH AND CORONARY ANGIOGRAPHY; Surgeon: Marykay LexHarding, David W, MD; [facility]; Service: Cardiovascular; Laterality: N/A;  . TEE WITHOUT CARDIOVERSION N/A 03/18/2017   Procedure: TRANSESOPHAGEAL ECHOCARDIOGRAM (TEE); Surgeon: Chilton Siandolph, Tiffany, MD; [facility]; Service: Cardiovascular; Laterality: N/A;  . TEE WITHOUT CARDIOVERSION N/A 04/16/2017   Procedure: TRANSESOPHAGEAL ECHOCARDIOGRAM (TEE); Surgeon: Purcell Nailswen, Clarence H, MD; [facility]; Service: Open Heart Surgery; Laterality: N/A;  . TONSILLECTOMY          Family History  Problem Relation Age of Onset  . Heart disease Mother   . Stroke Mother   . Hypertension Mother   . Heart disease Father   . Heart attack Father   . Ovarian cancer Sister   . Heart disease Brother   . Rheum arthritis Sister   . Melanoma Brother   . Brain cancer Brother   . Heart attack Brother   . Hypertension Brother   . Hypertension Sister   . Colon cancer Neg Hx    Social  History: reports that she has quit smoking. She has never used smokeless tobacco. She reports that she does not drink alcohol or use drugs.  Allergies:       Allergies  Allergen Reactions  . Pepcid [Famotidine] Swelling and Other (See Comments)    "Throat Swelling"   . Allopurinol Other (See Comments)    Caused headaches  . Amlodipine Other (See Comments)    PEDAL EDEMA   . Colchicine Other (See Comments)    UNSPECIFIED REACTION   . Contrast Media [Iodinated Diagnostic Agents] Hives    IVP dye per patient  . Prilosec [Omeprazole] Nausea Only  . Atorvastatin Other (See Comments)    UNSPECIFIED REACTION   . Cephalexin Itching and Rash  . Ciprofloxacin Itching and Rash  . Erythromycin Itching and Rash  . Sulfonamide Derivatives Itching and Rash  . Tetracycline Itching and Rash            Facility-Administered Medications Prior to Admission  Medication Dose Route Frequency Provider Last Rate Last Dose  . 0.9 % sodium chloride infusion 500 mL Intravenous Continuous Armbruster, Willaim RayasSteven P, MD           Medications Prior to Admission  Medication Sig Dispense Refill  . apixaban (ELIQUIS) 5 MG TABS tablet Take 1 tablet (5 mg total) by mouth 2 (two) times daily. (Patient not taking: Reported on 04/15/2017) 60 tablet 11  . atenolol (TENORMIN) 50 MG tablet Take 1 tablet (50 mg total) by mouth daily. (Patient taking differently: Take 50 mg by mouth  2 (two) times daily. ) 30 tablet 11  . conjugated estrogens (PREMARIN) vaginal cream Place 1 Applicatorful vaginally every 3 (three) days.     . fluticasone (FLONASE) 50 MCG/ACT nasal spray Place 2 sprays into both nostrils daily.    . furosemide (LASIX) 20 MG tablet Take 1 tablet (20 mg total) by mouth daily. 90 tablet 3  . levocetirizine (XYZAL) 5 MG tablet Take 5 mg by mouth every evening.    [name] levothyroxine (SYNTHROID, LEVOTHROID) 75 MCG tablet Take 75 mcg by mouth See admin instructions. Take 75 mcg by mouth daily around 0300    . naproxen  sodium (ANAPROX) 220 MG tablet Take 220 mg by mouth 2 (two) times daily as needed (for pain).     . NON FORMULARY 1 each by Other route See admin instructions. Allergy injections once weekly    . Probiotic Product (PROBIOTIC DAILY PO) Take 1 tablet by mouth daily.    . hydrocortisone valerate ointment (WEST-CORT) 0.2 % Apply 1 application topically 2 (two) times daily as needed (for itching).      Drug Regimen Review  Drug regimen was reviewed remains appropriately no significant issues identified  Home:  Home Living  Family/patient expects to be discharged to:: Inpatient rehab  Additional Comments: pt was living at home with spouse in 1 story home with 3 STE, walk in shower with seat in it  Functional History:  Prior Function  Level of Independence: Independent  Comments: driving, dressing, bathing, walking without AD  Functional Status:  Mobility:  Bed Mobility  Overal bed mobility: Needs Assistance  Bed Mobility: Sit to Supine  Sit to supine: Min assist  General bed mobility comments: assist for descending trunk into bed and LE management back into bed  Transfers  Overall transfer level: Needs assistance  Equipment used: [name] walker)  Transfers: Sit to/from Stand  Sit to Stand: Min assist, +2 safety/equipment  Stand pivot transfers: Max assist, +2 safety/equipment  General transfer comment: required tactile cues at upper thoracic back to lean forward as well as cues to hug pillow.  Ambulation/Gait  Ambulation/Gait assistance: Min assist, +2 physical assistance (3rd person for chair follow)  Ambulation Distance (Feet): 175 Feet  Assistive device: (EVa walker)  Gait Pattern/deviations: Step-through pattern, Decreased stride length, Trunk flexed, Wide base of support, Drifts right/left  General Gait Details: Pt was able to use [name] wlaker with constant cues to stay close toEva walker as well as cue for upright posture. Pt needed contant cues to sequence steps and [name] walker. Does  well with encouragament. 3 rd person for chair follow.   ADL:   Cognition:  Cognition  Overall Cognitive Status: Impaired/Different from baseline  Orientation Level: Oriented to person, Disoriented to place, Disoriented to time, Disoriented to situation  Cognition  Arousal/Alertness: Awake/alert  Behavior During Therapy: Flat affect  Overall Cognitive Status: Impaired/Different from baseline  Area of Impairment: Attention, Safety/judgement, Problem solving  Current Attention Level: Focused  Safety/Judgement: Decreased awareness of safety, Decreased awareness of deficits  Problem Solving: Slow processing, Decreased initiation, Difficulty sequencing, Requires verbal cues, Requires tactile cues  General Comments: pt with lethargy/fatigue and had difficulty follow commands during MMT  Physical Exam:  Blood pressure (!) 116/56, pulse 70, temperature 98.2 F (36.8 C), resp. rate 18, height 5' 6.5" (1.689 m), weight 61.5 kg (135 lb 9.6 oz), SpO2 95 %.  Physical Exam  Vitals reviewed.  Constitutional: She appears well-nourished. No distress.  HENT:  Head: Normocephalic.  Eyes: Pupils are equal,  round, and reactive to light. EOM are normal.  Neck: Normal range of motion. Neck supple. No tracheal deviation present. Thyromegaly present.  Cardiovascular: Normal rate and regular rhythm. Exam reveals friction rub.  No murmur heard. Respiratory: No respiratory distress. She has no wheezes. She has no rales. She exhibits tenderness.  Limited inspiratory effort but clear to auscultation  GI: Soft. Bowel sounds are normal. She exhibits no distension. There is no tenderness. There is no rebound.  Musculoskeletal: She exhibits tenderness (along left chest wall and axilla. no visible breakdown or bruising). She exhibits no edema.  Skin. Warm and dry  Neurological: She is alert.  Easily distracted. Delayed verbal output, word finding deficits, apraxic? Sometimes perseverative on certain tasks. Asked to  her ID several simple objects and either was unable to name or used some sort of paraphasia Motor: 4+/5 grossly throughout LUE and LLE. RUE and RLE grossly 3 to 4/5 but inconsistent and with definite motor apraxia. Appeared to sense pain in all 4's.  Psych :generally pleasant and cooperative  Lab Results Last 48 Hours  Imaging Results (Last 48 hours)     Medical Problem List and Plan:  1. Debility secondary to Maze procedure for symptomatic mitral regurgitation 04/16/2017 with sternal precautions/multi-medical. Ongoing confusion and expressive language deficits/apraxia  -CT was negative but she is demonstrating symptoms of left cortical infarct  -monitor clinically for persistent/consistent symptoms -may need to discuss with surgical team feasability of MRI after Maze procedure.   2. DVT Prophylaxis/Anticoagulation: Coumadin per pharmacy protocol  3. Pain Management: Tylenol as needed  4. Mood: Seroquel 50 mg daily at bedtime. Cranial CT scan negative for acute changes  5. Neuropsych: This patient is capable of making decisions on his own behalf.  6. Skin/Wound Care: Routine skin checks  7. Fluids/Electrolytes/Nutrition: Routine I&O with follow-up chemistries  8. Dysphagia. Dysphagia 2 thin liquids.  Follow-up speech therapy  9. CKD stage III. Followed in the past by Dr. [name] Follow-up chemistries  10. Hypertension. Tenormin 50 mg daily  11. Leukocytosis. Improving. Follow-up CBC  12. Hypothyroidism. Synthroid   Post Admission Physician Evaluation:  1. Functional deficits secondary to debility/encephalopathy/?embolic left cortical infarct after Maze procedure. 2. Patient is admitted to receive collaborative, interdisciplinary care between the physiatrist, rehab nursing staff, and therapy team. 3. Patient's level of medical complexity and substantial therapy needs in context of that medical necessity cannot be provided at a lesser intensity of care such as a SNF. 4. Patient has experienced substantial functional loss from his/her baseline which was documented above under the "Functional History" and "Functional Status" headings. Judging by the patient's diagnosis, physical exam, and functional history, the patient has potential for functional progress which will result in measurable gains while on inpatient rehab. These gains will be of substantial and practical use upon discharge in facilitating mobility and self-care at the household level. 5. Physiatrist will provide 24 hour management of medical needs as well as oversight of the therapy plan/treatment and provide guidance as appropriate regarding the interaction of the two. 6. The Preadmission Screening has been reviewed and patient status is unchanged unless otherwise stated above. 7. 24 hour rehab nursing will assist with bladder management, bowel management, safety, skin/wound care, disease management, medication administration, pain management and patient education and help integrate therapy concepts, techniques,education, etc. 8. PT will assess and treat for/with: Lower extremity strength, range of motion, stamina, balance, functional mobility, safety, adaptive techniques and equipment, NMR, pain mgt, family ed. Goals are: supervision  to mod I. 9. OT will assess and treat for/with: ADL's, functional mobility, safety, upper extremity strength, adaptive techniques and equipment, NMR, family ed. Goals are: mod I to supervision. Therapy may proceed with showering this patient. 10. SLP will assess and treat for/with: cognition, communication, language, family ed. Goals are: supervision to mod I. 11. Case Management and Social Worker will assess and treat for psychological issues and discharge planning. 12. Team conference will be held weekly to assess progress toward goals and to determine barriers to discharge. 13. Patient will receive at least 3 hours of therapy per day at least 5 days per week. 14. ELOS: 7-12 days  15. Prognosis: good   Ranelle Oyster, MD, [facility] Physical Medicine & Rehabilitation  04/26/2017  Charlton Amor., PA-C  04/25/2017

## 2017-04-26 NOTE — Progress Notes (Signed)
Patient transferred to rehab. Today. Patient was stable in good state but still confused. Was accompanied 6 times to the bedside commode and she got better with the transfers as the day grew older. Family was at bedside during transfer. notified of transfer and accompanied patient to new room.

## 2017-04-26 NOTE — Progress Notes (Signed)
CSW spoke with pt, pt spouse, and dtr at bedside concerning SNF as alternative option to CIR.  At this time family would prefer to go home with home services if CIR is not an option- states spouse will be at home 24/7 and that dtr will be there almost as much- feel as if her mental status would improve at home and that she would do much worse at a SNF.  CSW informed RNCM- CSW signing off  Burna Sis, LCSW Clinical Social Worker [phone]

## 2017-04-26 NOTE — Progress Notes (Addendum)
      [address]       [name] [address]             [phone]        CARDIOTHORACIC SURGERY PROGRESS NOTE  R10Days Post-OpS/P Procedure(s) (LRB): MINIMALLY INVASIVE MITRAL VALVE REPAIR (MVR) (Right) MINIMALLY INVASIVE MAZE PROCEDURE (N/A) TRANSESOPHAGEAL ECHOCARDIOGRAM (TEE) (N/A) CLIPPING OF ATRIAL APPENDAGE using AtriCure Pro2 clip 45   R10 Days Post-Op Procedure(s) (LRB): RE-EXPLORATION RIGHT THORACOTOMY FOR BLEEDING (Right)  Subjective: Still confused but seems to be slowly improving.  No pain, SOB.  Eating small amounts.  Ambulating w/ assistance.  Objective: Vital signs: BP Readings from Last 1 Encounters:  04/26/17 (!) 127/94   Pulse Readings from Last 1 Encounters:  04/26/17 74   Resp Readings from Last 1 Encounters:  04/26/17 17   Temp Readings from Last 1 Encounters:  04/26/17 98.7 F (37.1 C) (Oral)    Hemodynamics:    Physical Exam:  Rhythm:   AV paced  Breath sounds: clear  Heart sounds:  RRR w/out murmur  Incisions:  Clean and dry  Abdomen:  Soft, non-distended, non-tender  Extremities:  Warm, well-perfused   Intake/Output from previous day: 10/18 0701 - 10/19 0700 In: 3075 [P.O.:600; I.V.:2325; IV Piggyback:150] Out: 651 [Urine:650; Stool:1] Intake/Output this shift: Total I/O In: 29.2 [I.V.:29.2] Out: -   Lab Results:  CBC: Recent Labs  04/25/17 0338 04/26/17 0305  WBC 17.2* 16.1*  HGB 12.1 11.2*  HCT 39.1 36.5  PLT 85* 100*    BMET:  Recent Labs  04/25/17 0338 04/26/17 0305  NA 151* 149*  K 3.0* 3.8  CL 109 115*  CO2 33* 28  GLUCOSE 121* 153*  BUN 77* 62*  CREATININE 1.74* 1.71*  CALCIUM 9.0 8.5*     PT/INR:   Recent Labs  04/26/17 0305  LABPROT 22.9*  INR 2.04    CBG (last 3)   Recent Labs  04/25/17 1640 04/25/17 2050 04/26/17 0834  GLUCAP 104* 141* 87    ABG    Component Value Date/Time   PHART 7.520 (H) 04/19/2017 2132   PCO2ART 37.9 04/19/2017 2132   PO2ART  69.7 (L) 04/19/2017 2132   HCO3 30.8 (H) 04/19/2017 2132   TCO2 22 04/17/2017 1912   ACIDBASEDEF 1.3 04/18/2017 0505   O2SAT 94.5 04/19/2017 2132    CXR: n/a  Assessment/Plan:  Overall stable POD10 Maintaining AV paced rhythm w/ stable BP Breathing comfortably w/ O2 sats 95% on RA Postop delirium persists but slowly resolving, mild compensated cognitive dysfunction at baseline Elevated serum creatinine, likely due to prerenal azotemia +/- acute kidney injury caused by ATN Hypernatremia likely due to dehydration, weight now below preop baseline, probably relatively dehydrated Hypokalemia, induced by loop diuretics, contraction alkylosis Leukocytosis w/out fever, likely reactive, trending down INR trending up on Coumadin   Encourage oraldiet - may have to consider feeding tube placement if she doesn't improve  Continue gentle IV hydration w/ D5W + potassium, watch electrolytes  Continue to hold diuretics  Mobilize  PT  Decrease Coumadin dose  Transfer 4E but must have bedside sitter due to high fall risk  Ultimately anticipate d/c to CIR service or SNF  Purcell Nails, MD 04/26/2017 9:19 AM

## 2017-04-26 NOTE — Progress Notes (Signed)
ANTICOAGULATION CONSULT NOTE - Initial Consult  Pharmacy Consult for warfarin Indication: atrial fibrillation  Allergies  Allergen Reactions  . Pepcid [Famotidine] Swelling and Other (See Comments)    "Throat Swelling"   . Allopurinol Other (See Comments)    Caused headaches  . Amlodipine Other (See Comments)    PEDAL EDEMA   . Colchicine Other (See Comments)    UNSPECIFIED REACTION   . Contrast Media [Iodinated Diagnostic Agents] Hives    IVP dye per patient  . Prilosec [Omeprazole] Nausea Only  . Atorvastatin Other (See Comments)    UNSPECIFIED REACTION   . Cephalexin Itching and Rash  . Ciprofloxacin Itching and Rash  . Erythromycin Itching and Rash  . Sulfonamide Derivatives Itching and Rash  . Tetracycline Itching and Rash    Patient Measurements: Height: 5' 6.5" (168.9 cm) IBW/kg (Calculated) : 60.45  Vital Signs: Temp: 97.8 F (36.6 C) (10/19 1736) Temp Source: Oral (10/19 1736) BP: 148/69 (10/19 1736) Pulse Rate: 78 (10/19 1736)  Labs:  Recent Labs  04/24/17 0212 04/25/17 0338 04/26/17 0305  HGB 14.9 12.1 11.2*  HCT 46.4* 39.1 36.5  PLT 68* 85* 100*  LABPROT 14.6 19.7* 22.9*  INR 1.15 1.69 2.04  CREATININE 1.53* 1.74* 1.71*    Estimated Creatinine Clearance: 25.9 mL/min (A) (by C-G formula based on SCr of 1.71 mg/dL (H)).   Medical History: Past Medical History:  Diagnosis Date  . Allergic rhinitis   . Anemia   . CKD (chronic kidney disease), stage III (HCC)    stage III  . DJD (degenerative joint disease)   . Glomerulonephritis    Dr [name]  . Hyperlipidemia   . Hyperparathyroidism (HCC)   . Hypertension   . Hypothyroidism   . IBS (irritable bowel syndrome)   . Interstitial cystitis   . Mitral regurgitation   . Mobitz II    a. s/p STJ dual chamber PPM   . MVP (mitral valve prolapse)    moderate posterior MVP with moderate MR and grade II diasotlic dysfunction  . PAC (premature atrial contraction)   . PAF (paroxysmal atrial  fibrillation) (HCC)     note on pacer check. CHADS2VASC score is 4 now on Eliquis.  . Paroxysmal atrial fibrillation (HCC)    CHADS2VASC score is 4  . PVC's (premature ventricular contractions)   . S/P minimally invasive maze operation for atrial fibrillation 04/16/2017   Complete bilateral atrial lesion set using cryothermy and bipolar radiofrequency ablation with clipping of LA appendage via right mini thoracotomy approach  . S/P minimally invasive mitral valve repair 04/16/2017   Complex valvuloplasty including triangular resection of posterior leaflet, artificial Gore-tex neochords x6 and Sorin Memo 3D ring annuloplasty ([id number], size 32, serial # [id number])  . S/P placement of cardiac pacemaker   . Small vessel disease, cerebrovascular     Medications:  Prescriptions Prior to Admission  Medication Sig Dispense Refill Last Dose  . atenolol (TENORMIN) 50 MG tablet Take 1 tablet (50 mg total) by mouth daily. (Patient taking differently: Take 50 mg by mouth 2 (two) times daily. ) 30 tablet 11 04/16/2017 at 0400  . conjugated estrogens (PREMARIN) vaginal cream Place 1 Applicatorful vaginally every 3 (three) days.    04/15/2017 at Unknown time  . fluticasone (FLONASE) 50 MCG/ACT nasal spray Place 2 sprays into both nostrils daily.   04/16/2017 at 0400  . hydrocortisone valerate ointment (WEST-CORT) 0.2 % Apply 1 application topically 2 (two) times daily as needed (for itching).    More than  a month at Unknown time  . levocetirizine (XYZAL) 5 MG tablet Take 5 mg by mouth every evening.   04/15/2017 at Unknown time  . levothyroxine (SYNTHROID, LEVOTHROID) 75 MCG tablet Take 75 mcg by mouth See admin instructions. Take 75 mcg by mouth daily around 0300   04/16/2017 at 0400  . Maltodextrin-Xanthan Gum (RESOURCE THICKENUP CLEAR) POWD Nectar thick consistency     . NON FORMULARY 1 each by Other route See admin instructions. Allergy injections once weekly   04/15/2017 at Unknown time  . Probiotic Product  (PROBIOTIC DAILY PO) Take 1 tablet by mouth daily.   04/15/2017 at Unknown time  . QUEtiapine (SEROQUEL) 50 MG tablet Take 1 tablet (50 mg total) by mouth at bedtime.     [name]. warfarin (COUMADIN) 1 MG tablet Take 1 tablet (1 mg total) by mouth daily at 6 PM.       Assessment: 89 YOF with h/o Afib on warfarin at home s/p mitral valve repair and clipping of atrial appendage. Patient has received 4 doses of warfarin and INR is now therapeutic at 2.04. H/H trending down, Plt low but improved to 100k   No significant drug interactions noted.   Goal of Therapy:  INR 2-3 Monitor platelets by anticoagulation protocol: Yes   Plan:  -Warfarin 1 mg x 1 dose tonight -Monitor daily PT/INR   Vinnie LevelBenjamin Arion Shankles, PharmD., BCPS Clinical Pharmacist Pager [phone]

## 2017-04-26 NOTE — Plan of Care (Signed)
Problem: RH PAIN MANAGEMENT Goal: RH STG PAIN MANAGED AT OR BELOW PT'S PAIN GOAL Pain less than or equal to 2.   

## 2017-04-26 NOTE — PMR Pre-admission (Signed)
PMR Admission Coordinator Pre-Admission Assessment  Patient: Amanda [name] is an 76 y.o., female MRN: [id number] DOB: Sep 08, 1940 Height: 5' 6.5" (168.9 cm) Weight: 62.9 kg (138 lb 9.6 oz)             Insurance Information HMO: Yes     PPO:       PCP:       IPA:       80/20:       OTHER:   PRIMARY:  [employer] medicare      Policy#: [id number]      Subscriber:  [place] Name:  Vevelyn Royals      Phone#: [phone]     Fax#: [phone] Pre-Cert#:  [id number]      Employer:  Retired Benefits:  Phone #: [phone]     Name:  On line Eff. Date: 07/09/16     Deduct:  $0      Out of Pocket Max: $4400 (met $1494.02      Life Max:  N/A CIR: $345 days 1-5      SNF: $0 days 1-20; $160 days 21-48; $0 days 49-100 Outpatient: medical necessity     Co-Pay: $40/visit Home Health: 100%      Co-Pay: none DME: 80%     Co-Pay: 20% Providers: in network  Emergency Contact Information Contact Information    Name Relation Home Work Mobile   Carrico,Charles Spouse [phone]     Taney,Cathy Daughter [phone]  [phone]     Current Medical History  Patient Admitting Diagnosis: Debility  History of Present Illness: A 76 y.o.right handed femalewith history of mitral valve prolapse and mitral regurgitation,CVA,hypertension, stage III CKD, heart block with pacemaker, PAF on long-term anticoagulation/Eliquis. Patient lives with spouse. Reportedly independent and driving prior to admission.  Presented 04/15/2017 for evaluation and treatment of symptomatic mitral regurgitation. Recent echocardiogram with normal left ventricular systolic function mild to moderate mitral regurgitation. Follow-up echo showed significant worsening in severity of mitral regurgitation. Underwent Maze procedure 04/16/2017 per Dr. Roxy Manns. Postoperatively patient with 150-250 mL of chest tube output per hour and underwent reexploration of right mini thoracotomy for any bleeding same date 04/16/2017 with no findings of  reaccumulated blood with ongoing bleeding Hospital course pain management. Sternal precautions as directed.  Hospital course leukocytosis 21,600 and monitored with latest of 17,200 .Intermittent bouts of confusion and restlessness. Cranial CT scan reviewed, unremarkable for acute process.Subcutaneous Lovenox for DVT prophylaxis and transitioned to Coumadin. Initially maintained on TPN for nutritional support and diet advanced to a dysphagia #2 thin liquid diet Physical and occupational therapy evaluation completed with recommendations of physical medicine rehabilitation consult. Patient to be admitted for a comprehensive inpatient rehabilitation program.   Past Medical History  Past Medical History:  Diagnosis Date  . Allergic rhinitis   . Anemia   . CKD (chronic kidney disease), stage III ([place])    stage III  . DJD (degenerative joint disease)   . Glomerulonephritis    Dr Jimmy Footman  . Hyperlipidemia   . Hyperparathyroidism ([place])   . Hypertension   . Hypothyroidism   . IBS (irritable bowel syndrome)   . Interstitial cystitis   . Mitral regurgitation   . Mobitz II    a. s/p STJ dual chamber PPM   . MVP (mitral valve prolapse)    moderate posterior MVP with moderate MR and grade II diasotlic dysfunction  . PAC (premature atrial contraction)   . PAF (paroxysmal atrial fibrillation) ([place])     note on pacer check.  CHADS2VASC score is 4 now on Eliquis.  . Paroxysmal atrial fibrillation (HCC)    CHADS2VASC score is 4  . PVC's (premature ventricular contractions)   . S/P minimally invasive maze operation for atrial fibrillation 04/16/2017   Complete bilateral atrial lesion set using cryothermy and bipolar radiofrequency ablation with clipping of LA appendage via right mini thoracotomy approach  . S/P minimally invasive mitral valve repair 04/16/2017   Complex valvuloplasty including triangular resection of posterior leaflet, artificial Gore-tex neochords x6 and Sorin Memo 3D ring annuloplasty  ([id number], size 32, serial # [id number])  . S/P placement of cardiac pacemaker   . Small vessel disease, cerebrovascular     Family History  family history includes Brain cancer in her brother; Heart attack in her brother and father; Heart disease in her brother, father, and mother; Hypertension in her brother, mother, and sister; Melanoma in her brother; Ovarian cancer in her sister; Rheum arthritis in her sister; Stroke in her mother.  Prior Rehab/Hospitalizations: No rehab admissions.  Has the patient had major surgery during 100 days prior to admission? No  Current Medications   Current Facility-Administered Medications:  .  atenolol (TENORMIN) tablet 50 mg, 50 mg, Oral, Daily, [name], MD, 50 mg at 04/26/17 0928 .  [DISCONTINUED] bisacodyl (DULCOLAX) EC tablet 10 mg, 10 mg, Oral, Daily **OR** bisacodyl (DULCOLAX) suppository 10 mg, 10 mg, Rectal, Daily, [name], MD, 10 mg at 04/26/17 0931 .  chlorhexidine (PERIDEX) 0.12 % solution 15 mL, 15 mL, Mouth Rinse, BID, [name], MD, 15 mL at 04/26/17 0928 .  Chlorhexidine Gluconate Cloth 2 % PADS 6 each, 6 each, Topical, Daily, [name], MD, 6 each at 04/26/17 [phone] .  dextrose 5% lactated ringers 1,000 mL with potassium chloride 40 mEq/L Pediatric IV infusion, , Intravenous, Continuous, [name], MD, Last Rate: 50 mL/hr at 04/26/17 0900 .  feeding supplement (ENSURE ENLIVE) (ENSURE ENLIVE) liquid 237 mL, 237 mL, Oral, BID BM, [name], MD, 237 mL at 04/26/17 1400 .  haloperidol lactate (HALDOL) injection 1-4 mg, 1-4 mg, Intravenous, Q3H PRN, Minor, Grace Bushy, NP, 1 mg at 04/19/17 1425 .  hydrALAZINE (APRESOLINE) injection 10 mg, 10 mg, Intravenous, Q6H PRN, Melrose Nakayama, MD .  labetalol (NORMODYNE,TRANDATE) injection 10 mg, 10 mg, Intravenous, Q2H PRN, [name], MD, 10 mg at 04/20/17 0654 .  levothyroxine (SYNTHROID, LEVOTHROID) tablet 75 mcg, 75 mcg, Oral, Q0600, [name],  MD, 75 mcg at 04/26/17 0600 .  MEDLINE mouth rinse, 15 mL, Mouth Rinse, q12n4p, [name], MD, 15 mL at 04/26/17 1200 .  metoprolol tartrate (LOPRESSOR) injection 2.5-5 mg, 2.5-5 mg, Intravenous, Q2H PRN, [name], MD, 5 mg at 04/20/17 0355 .  ondansetron (ZOFRAN) injection 4 mg, 4 mg, Intravenous, Q6H PRN, [name], MD .  pantoprazole (PROTONIX) EC tablet 40 mg, 40 mg, Oral, Daily, [name], MD, 40 mg at 04/26/17 0928 .  QUEtiapine (SEROQUEL) tablet 50 mg, 50 mg, Oral, QHS, [name], MD, 50 mg at 04/25/17 2000 .  RESOURCE THICKENUP CLEAR, , Oral, PRN, Melrose Nakayama, MD .  sodium chloride flush (NS) 0.9 % injection 10-40 mL, 10-40 mL, Intracatheter, Q12H, [name], MD, 10 mL at 04/26/17 1000 .  warfarin (COUMADIN) tablet 1 mg, 1 mg, Oral, q1800, [name], MD, 1 mg at 04/25/17 1752 .  Warfarin - Physician Dosing Inpatient, , Does not apply, q1800, Blenda Nicely, RPH, 1  each at 04/24/17 1800  Patients Current Diet: DIET DYS 2 Room service appropriate? Yes; Fluid consistency: Thin  Precautions / Restrictions Precautions Precautions: Fall Precaution Comments: chest tube in R side Restrictions Weight Bearing Restrictions: No Other Position/Activity Restrictions: sternal precautions   Has the patient had 2 or more falls or a fall with injury in the past year?No  Prior Activity Level Community (5-7x/wk): Went out 5 X a week, was driving.  Home Assistive Devices / Equipment None  Prior Device Use: Indicate devices/aids used by the patient prior to current illness, exacerbation or injury? None  Prior Functional Level Prior Function Level of Independence: Independent Comments: driving, dressing, bathing, walking without AD  Self Care: Did the patient need help bathing, dressing, using the toilet or eating?  Independent  Indoor Mobility: Did the patient need assistance with walking from room to room (with or without device)?  Independent  Stairs: Did the patient need assistance with internal or external stairs (with or without device)? Independent  Functional Cognition: Did the patient need help planning regular tasks such as shopping or remembering to take medications? Independent  Current Functional Level Cognition  Overall Cognitive Status: Impaired/Different from baseline Current Attention Level: Focused Orientation Level: Oriented to person, Oriented to place, Disoriented to time, Disoriented to situation Following Commands: Follows one step commands inconsistently, Follows one step commands with increased time Safety/Judgement: Decreased awareness of safety General Comments: pt with lethargy/fatigue and had difficulty follow commands     Extremity Assessment (includes Sensation/Coordination)  Upper Extremity Assessment: Generalized weakness  Lower Extremity Assessment: Defer to PT evaluation    ADLs  Overall ADL's : Needs assistance/impaired Eating/Feeding: Total assistance, Sitting Grooming: Wash/dry hands, Wash/dry face, Oral care, Brushing hair, Total assistance, Sitting Upper Body Bathing: Total assistance, Sitting Lower Body Bathing: Total assistance, Sit to/from stand Upper Body Dressing : Total assistance, Sitting Lower Body Dressing: Total assistance, Sit to/from stand Toilet Transfer: Moderate assistance, +2 for physical assistance, RW, BSC, Grab bars, Comfort height toilet, Ambulation Toileting- Clothing Manipulation and Hygiene: Total assistance, Sit to/from stand Functional mobility during ADLs: Moderate assistance, +2 for physical assistance, Rolling walker (EVA walker ) General ADL Comments: Pt requires assist due to severity of cognitive deficits and currently with focused attention.  She is able to engage briefly in ADL tasks, but becomes distracted and unable to complete     Mobility  Overal bed mobility: Needs Assistance Bed Mobility: Sit to Supine Sit to supine: Min  assist General bed mobility comments: Pt sitting up in the chair.  On arrival, pt slid down in chair.  Had to use max assist to get her positioned well.  Of note, took geomat cushion out at end of treaztment as feel that it was causing pt to slide out.     Transfers  Overall transfer level: Needs assistance Equipment used:  (EVA walker ) Transfers: Sit to/from Stand Sit to Stand: Mod assist, +2 physical assistance, Max assist Stand pivot transfers: Mod assist, +2 physical assistance General transfer comment: assist to move into standing, assist for balance and assist for walker safety.  constant cues and support to maintain standing.     Ambulation / Gait / Stairs / Wheelchair Mobility  Ambulation/Gait Ambulation/Gait assistance: +2 physical assistance, Mod assist, Max assist (3rd person for chair follow) Ambulation Distance (Feet): 5 Feet Assistive device:  (EVa walker) Gait Pattern/deviations: Step-through pattern, Decreased stride length, Trunk flexed, Wide base of support, Drifts right/left, Decreased step length - right, Decreased step length - left  General Gait Details: Pt was able to use Eva walker with constant cues to stay close toEva walker as well as cue for upright posture.  Pt needed contant cues to sequence steps and Harmon Pier walker. However today even with encouragement, pt would not ambulate over 5 feet.  Pt was confused and feel that this limited her.  3rd person for chair folllow.  Gait velocity interpretation: Below normal speed for age/gender    Posture / Balance Dynamic Sitting Balance Sitting balance - Comments: Pt requires min guard to min A  Balance Overall balance assessment: Needs assistance Sitting-balance support: Feet supported Sitting balance-Leahy Scale: Poor Sitting balance - Comments: Pt requires min guard to min A  Postural control: Posterior lean Standing balance support: Bilateral upper extremity supported Standing balance-Leahy Scale: Poor Standing  balance comment: Pt reliant on bil. UE support and mod A     Special needs/care consideration BiPAP/CPAP No CPM No Continuous Drip IV No Dialysis No      Life Vest No Oxygen No Special Bed No Trach Size No Wound Vac (area) No     Skin No                             Bowel mgmt: Last BM 04/25/17 Bladder mgmt: Voiding up on BSC Diabetic mgmt No    Previous Home Environment Additional Comments: pt was living at home with spouse in 1 story home with 3 STE, walk in shower with seat in it  Discharge Living Setting Plans for Discharge Living Setting: Patient's home, House, Lives with (comment) (Lives with husband.  Daughter is 5 mins away.) Type of Home at Discharge: House Discharge Home Layout: One level Discharge Home Access: Stairs to enter Entrance Stairs-Number of Steps: 2  Social/Family/Support Systems Patient Roles: Spouse, Parent (Has a husband and a daughter.) Contact Information: Minal Stuller - spouse - [phone] Anticipated Caregiver: Husband and daughter Anticipated Caregiver's Contact Information: Adonis Housekeeper - daughter - [phone] Ability/Limitations of Caregiver: Husband retired and can assist.  Daughter can work from home most days of the week. Caregiver Availability: 24/7 Discharge Plan Discussed with Primary Caregiver: Yes Is Caregiver In Agreement with Plan?: Yes Does Caregiver/Family have Issues with Lodging/Transportation while Pt is in Rehab?: No  Goals/Additional Needs Patient/Family Goal for Rehab: PT/OT supervision, SLP mod I and supervision goals Expected length of stay: 6-10 days Cultural Considerations: [facility] Dietary Needs: Dys 2, thin liquids Equipment Needs: TBD Pt/Family Agrees to Admission and willing to participate: Yes Program Orientation Provided & Reviewed with Pt/Caregiver Including Roles  & Responsibilities: Yes  Decrease burden of Care through IP rehab admission: N/A  Possible need for SNF placement upon discharge: Not  planned  Patient Condition: This patient's medical and functional status has changed since the consult dated: 04/24/17 in which the Rehabilitation Physician determined and documented that the patient's condition is appropriate for intensive rehabilitative care in an inpatient rehabilitation facility. See "History of Present Illness" (above) for medical update. Functional changes are:  Currently requiring mod/max assist to ambulate 5 feet EVa walker. Patient's medical and functional status update has been discussed with the Rehabilitation physician and patient remains appropriate for inpatient rehabilitation. Will admit to inpatient rehab today.  Preadmission Screen Completed By:  Retta Diones, 04/26/2017 3:54 PM ______________________________________________________________________   Discussed status with Dr.  Naaman Plummer on 04/26/17 at 1553 and received telephone approval for admission today.  Admission Coordinator:  Retta Diones, time 1553/Date 04/26/17

## 2017-04-26 NOTE — Progress Notes (Signed)
  Speech Language Pathology Treatment: Dysphagia  Patient Details Name: Amanda [name] MRN: [id number] DOB: 12-25-1940 Today's Date: 04/26/2017 Time: 4665-9935 SLP Time Calculation (min) (ACUTE ONLY): 8 min  Assessment / Plan / Recommendation Clinical Impression  SLP spoke with pt's daughter who reported she consumed 4 oz applesauce without apparent difficulty. During rest break with PT pt consumed several cup sips water requiring min-mod assist to reach oral cavity. She appeared to require extra effort/focus to keep bolus in oral cavity and propel cohesively without spilling prematurely. At times of lethargy she is at high risk for reduced bolus cohesion. Recommend continue Dys 2 texture, check mouth for pocketing, po's when alert, cup sips and full supervision.    HPI HPI: 76 yo female with an extensive pmh including esophageal dysmotility and stricture, s/p balloon dilation (02/19/17), who underwent maze and minimal invasive mitral valve repair 10/9. Subsequently encephalopathic, hypoxic requiring bipap. 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 Referred for swallowing evaluation. MBS recommended to fully assess pharyngeal phase swallow given consistent throat clear with thin and thick consistencies.       SLP Plan  Continue with current plan of care       Recommendations  Diet recommendations: Dysphagia 2 (fine chop);Thin liquid Liquids provided via: Cup;No straw Medication Administration: Crushed with puree Supervision: Full supervision/cueing for compensatory strategies;Patient able to self feed Compensations: Slow rate;Small sips/bites;Minimize environmental distractions Postural Changes and/or Swallow Maneuvers: Seated upright 90 degrees                Oral Care Recommendations: Oral care BID Follow up Recommendations: Inpatient Rehab SLP Visit Diagnosis: Dysphagia, unspecified  (R13.10) Plan: Continue with current plan of care                       [name] 04/26/2017, 12:12 PM  [name] Lonell Face.[employer] [phone]

## 2017-04-27 ENCOUNTER — Inpatient Hospital Stay (HOSPITAL_COMMUNITY): Payer: Medicare Other | Admitting: Speech Pathology

## 2017-04-27 ENCOUNTER — Inpatient Hospital Stay (HOSPITAL_COMMUNITY): Payer: Medicare Other

## 2017-04-27 ENCOUNTER — Inpatient Hospital Stay (HOSPITAL_COMMUNITY): Payer: Medicare Other | Admitting: Physical Therapy

## 2017-04-27 DIAGNOSIS — Z5181 Encounter for therapeutic drug level monitoring: Secondary | ICD-10-CM

## 2017-04-27 DIAGNOSIS — D72829 Elevated white blood cell count, unspecified: Secondary | ICD-10-CM

## 2017-04-27 DIAGNOSIS — Z7901 Long term (current) use of anticoagulants: Secondary | ICD-10-CM

## 2017-04-27 DIAGNOSIS — N183 Chronic kidney disease, stage 3 unspecified: Secondary | ICD-10-CM

## 2017-04-27 DIAGNOSIS — R0789 Other chest pain: Secondary | ICD-10-CM

## 2017-04-27 DIAGNOSIS — R0602 Shortness of breath: Secondary | ICD-10-CM

## 2017-04-27 LAB — PROTIME-INR
INR: 1.67
Prothrombin Time: 19.5 seconds — ABNORMAL HIGH (ref 11.4–15.2)

## 2017-04-27 MED ORDER — NYSTATIN 100000 UNIT/ML MT SUSP
5.0000 mL | Freq: Three times a day (TID) | OROMUCOSAL | Status: DC
  Administered 2017-04-27 – 2017-05-04 (×19): 500000 [IU] via ORAL
  Filled 2017-04-27 (×18): qty 5

## 2017-04-27 MED ORDER — WARFARIN SODIUM 2.5 MG PO TABS
2.5000 mg | ORAL_TABLET | Freq: Once | ORAL | Status: AC
  Administered 2017-04-27: 2.5 mg via ORAL
  Filled 2017-04-27: qty 1

## 2017-04-27 MED ORDER — FLUCONAZOLE 100 MG PO TABS
150.0000 mg | ORAL_TABLET | Freq: Once | ORAL | Status: AC
  Administered 2017-04-27: 150 mg via ORAL
  Filled 2017-04-27: qty 2

## 2017-04-27 MED ORDER — SODIUM CHLORIDE 0.9% FLUSH
10.0000 mL | INTRAVENOUS | Status: DC | PRN
  Administered 2017-04-27 (×2): 10 mL
  Filled 2017-04-27 (×2): qty 40

## 2017-04-27 NOTE — Evaluation (Signed)
Physical Therapy Assessment and Plan  Patient Details  Name: Amanda [name] MRN: [id number] Date of Birth: 07-Jan-1941  PT Diagnosis: Cognitive deficits, Coordination disorder, Difficulty walking and Muscle weakness Rehab Potential: Fair ELOS: 16-18 days   Today's Date: 04/27/2017 PT Individual Time: 0900-1000 PT Individual Time Calculation (min): 60 min    Problem List:  Patient Active Problem List   Diagnosis Date Noted  . Subtherapeutic anticoagulation   . Debility 04/26/2017  . History of CVA (cerebrovascular accident)   . S/P placement of cardiac pacemaker   . Post-operative pain   . Labile blood glucose   . Hypernatremia   . S/P minimally invasive mitral valve repair + maze procedure 04/16/2017  . S/P minimally invasive maze operation for atrial fibrillation 04/16/2017  . DOE (dyspnea on exertion) 03/05/2017  . Paroxysmal atrial fibrillation (HCC)   . Acute delirium 04/28/2015  . Acute UTI 04/01/2015  . Acute encephalopathy 04/01/2015  . AKI (acute kidney injury) ([facility]) 04/01/2015  . Leukocytosis 04/01/2015  . Thrombocytopenia ([place]) 04/01/2015  . Hyponatremia 04/01/2015  . Hypothyroidism 04/01/2015  . CKD (chronic kidney disease), stage III ([place]) 04/01/2015  . UTI (lower urinary tract infection)   . Severe sepsis without septic shock ([place])   . Pacemaker 02/03/2015  . AV block 01/06/2015  . RBBB 05/21/2014  . Mitral regurgitation 11/26/2013  . PAC (premature atrial contraction)   . MVP (mitral valve prolapse) 11/11/2013  . DIARRHEA 07/24/2010  . GLOMERULONEPHRITIS 03/27/2010  . COLITIS 10/06/2009  . Essential hypertension 10/05/2009  . DYSPHAGIA UNSPECIFIED 10/05/2009  . Personal history of other endocrine, metabolic, and immunity disorders 10/05/2009    Past Medical History:  Past Medical History:  Diagnosis Date  . Allergic rhinitis   . Anemia   . CKD (chronic kidney disease), stage III ([place])    stage III  . DJD (degenerative joint disease)   .  Glomerulonephritis    Dr Jimmy Footman  . Hyperlipidemia   . Hyperparathyroidism ([place])   . Hypertension   . Hypothyroidism   . IBS (irritable bowel syndrome)   . Interstitial cystitis   . Mitral regurgitation   . Mobitz II    a. s/p STJ dual chamber PPM   . MVP (mitral valve prolapse)    moderate posterior MVP with moderate MR and grade II diasotlic dysfunction  . PAC (premature atrial contraction)   . PAF (paroxysmal atrial fibrillation) ([place])     note on pacer check. CHADS2VASC score is 4 now on Eliquis.  . Paroxysmal atrial fibrillation (HCC)    CHADS2VASC score is 4  . PVC's (premature ventricular contractions)   . S/P minimally invasive maze operation for atrial fibrillation 04/16/2017   Complete bilateral atrial lesion set using cryothermy and bipolar radiofrequency ablation with clipping of LA appendage via right mini thoracotomy approach  . S/P minimally invasive mitral valve repair 04/16/2017   Complex valvuloplasty including triangular resection of posterior leaflet, artificial Gore-tex neochords x6 and Sorin Memo 3D ring annuloplasty ([id number], size 32, serial # [id number])  . S/P placement of cardiac pacemaker   . Small vessel disease, cerebrovascular    Past Surgical History:  Past Surgical History:  Procedure Laterality Date  . ABDOMINAL HYSTERECTOMY    . BREAST BIOPSY Right   . CLIPPING OF ATRIAL APPENDAGE  04/16/2017   Procedure: CLIPPING OF ATRIAL APPENDAGE using AtriCure Pro2 clip 45;  Surgeon: [name], MD;  Location: [place];  Service: Open Heart Surgery;;  . CYSTOSTOMY W/ BLADDER BIOPSY    .  EP IMPLANTABLE DEVICE N/A 01/07/2015   STJ dual chamber pacemaker implanted by Dr Lovena Le for 2:1 heart block  . MINIMALLY INVASIVE MAZE PROCEDURE N/A 04/16/2017   Procedure: MINIMALLY INVASIVE MAZE PROCEDURE;  Surgeon: [name], MD;  Location: [place];  Service: Open Heart Surgery;  Laterality: N/A;  . MITRAL VALVE REPAIR Right 04/16/2017   Procedure: MINIMALLY INVASIVE  MITRAL VALVE REPAIR (MVR);  Surgeon: [name], MD;  Location: [place];  Service: Open Heart Surgery;  Laterality: Right;  . MITRAL VALVE REPAIR Right 04/16/2017   Procedure: RE-EXPLORATION RIGHT THORACOTOMY FOR BLEEDING;  Surgeon: [name], MD;  Location: [place];  Service: Open Heart Surgery;  Laterality: Right;  . PERIPHERAL VASCULAR CATHETERIZATION N/A 02/03/2015   Procedure: Upper Extremity Venography;  Surgeon: Deboraha Sprang, MD;  Location: [facility];  Service: Cardiovascular;  Laterality: N/A;  . RIGHT/LEFT HEART CATH AND CORONARY ANGIOGRAPHY N/A 03/26/2017   Procedure: RIGHT/LEFT HEART CATH AND CORONARY ANGIOGRAPHY;  Surgeon: [name], MD;  Location: [facility];  Service: Cardiovascular;  Laterality: N/A;  . TEE WITHOUT CARDIOVERSION N/A 03/18/2017   Procedure: TRANSESOPHAGEAL ECHOCARDIOGRAM (TEE);  Surgeon: Skeet Latch, MD;  Location: [place];  Service: Cardiovascular;  Laterality: N/A;  . TEE WITHOUT CARDIOVERSION N/A 04/16/2017   Procedure: TRANSESOPHAGEAL ECHOCARDIOGRAM (TEE);  Surgeon: [name], MD;  Location: [place];  Service: Open Heart Surgery;  Laterality: N/A;  . TONSILLECTOMY      Assessment & Plan Clinical Impression: A 76 y.o. right handed female with history of mitral valve prolapse and mitral regurgitation, CVA, hypertension, stage III CKD, heart block with pacemaker, PAF on long-term anticoagulation/Eliquis. Patient lives with spouse. Reportedly independent and driving prior to admission.  Presented 04/15/2017 for evaluation and treatment of symptomatic mitral regurgitation. Recent echocardiogram with normal left ventricular systolic function mild to moderate mitral regurgitation. Follow-up echo showed significant worsening in severity of mitral regurgitation. Underwent Maze procedure 04/16/2017 per Dr. Roxy Manns. Postoperatively patient with 150-250 mL of chest tube output per hour and underwent reexploration of right mini  thoracotomy for any bleeding same date 04/16/2017 with no findings of reaccumulated blood with ongoing bleeding Hospital course pain management. Sternal precautions as directed.  Hospital course leukocytosis 21,600 and monitored with latest of 17,200 . Intermittent bouts of confusion and restlessness. Cranial CT scan reviewed, unremarkable for acute process. Subcutaneous Lovenox for DVT prophylaxis and transitioned to Coumadin. Initially maintained on TPN for nutritional support and diet advanced to a dysphagia #2 thin liquid diet Physical and occupational therapy evaluation completed with recommendations of physical medicine rehabilitation consult. Patient to be admitted for a comprehensive inpatient rehabilitation program.  Patient transferred to CIR on 04/26/2017 .   Patient currently requires mod to +2 for transfers and gait with mobility secondary to muscle weakness, decreased cardiorespiratoy endurance, decreased coordination, decreased attention, decreased awareness, decreased problem solving, decreased safety awareness and decreased memory and decreased sitting balance, decreased standing balance, decreased postural control, decreased balance strategies and difficulty maintaining precautions.  Prior to hospitalization, patient was modified independent  with mobility and lived with Spouse, Daughter in a House home.  Home access is 2Stairs to enter.  Patient will benefit from skilled PT intervention to maximize safe functional mobility, minimize fall risk and decrease caregiver burden for planned discharge home with 24 hour supervision.  Anticipate patient will benefit from follow up [place] at discharge.  PT - End of Session Activity Tolerance: Tolerates 10 - 20 min activity with multiple  rests Endurance Deficit: Yes PT Assessment Rehab Potential (ACUTE/IP ONLY): Fair PT Patient demonstrates impairments in the following area(s): Balance;Endurance;Motor;Safety PT Transfers Functional Problem(s): Bed  Mobility;Bed to Chair;Car;Furniture PT Locomotion Functional Problem(s): Stairs;Ambulation;Wheelchair Mobility PT Plan PT Intensity: Minimum of 1-2 x/day ,45 to 90 minutes PT Frequency: 5 out of 7 days PT Duration Estimated Length of Stay: 16-18 days PT Treatment/Interventions: Ambulation/gait training;Balance/vestibular training;Cognitive remediation/compensation;Discharge planning;Community reintegration;DME/adaptive equipment instruction;Functional mobility training;Patient/family education;Neuromuscular re-education;Psychosocial support;Splinting/orthotics;Therapeutic Exercise;Therapeutic Activities;UE/LE Strength taining/ROM;Stair training;UE/LE Coordination activities;Visual/perceptual remediation/compensation;Wheelchair propulsion/positioning PT Transfers Anticipated Outcome(s): supervision PT Locomotion Anticipated Outcome(s): supervision with LRAD PT Recommendation Recommendations for Other Services: Neuropsych consult Follow Up Recommendations: Home health PT;24 hour supervision/assistance Patient destination: Home Equipment Recommended: To be determined  Skilled Therapeutic Intervention No c/o pain at rest but does wince when pressure applied over incision for transfers.  PT initiated mobility evaluation and provided pt/family education for role of PT, plan of care, goals of therapy, and safety plan.  PT instructed pt in bed mobility and transfers as below.  Requires +2 to stand from w/c with max cues for forward weight shift and coming upright.  Gait with EVA walker and +2 for pt confidence.  Handoff to OT in gym.    PT Evaluation Precautions/Restrictions Precautions Precautions: Fall;Sternal Restrictions Other Position/Activity Restrictions: sternal precautions Pain Pain Assessment Pain Assessment: Faces Faces Pain Scale: Hurts a little bit Pain Location: Breast Pain Orientation: Right Pain Intervention(s): Repositioned;RN made aware Home Living/Prior Functioning Home  Living Available Help at Discharge: Family;Available 24 hours/day Type of Home: House Home Access: Stairs to enter [employer] of Steps: 2 Entrance Stairs-Rails: None Home Layout: One level  Lives With: Spouse;Daughter Prior Function Level of Independence: Independent with transfers;Independent with gait  Able to Take Stairs?: Yes Driving: Yes (per pt report) Vocation: Retired Leisure: Hobbies-yes (Comment) Comments: singing in the choice at church Vision/Perception  Vision - Assessment Additional Comments: to be further tested throughout length of stay Perception Perception: Within Functional Limits Praxis Praxis: Impaired Praxis Impairment Details: Motor planning  Cognition Overall Cognitive Status: Impaired/Different from baseline Arousal/Alertness: Awake/alert Orientation Level: Oriented to person;Disoriented to place;Disoriented to time;Disoriented to situation Attention: Sustained Sustained Attention: Impaired Sustained Attention Impairment: Verbal basic;Functional basic Memory: Impaired Memory Impairment: Decreased recall of new information Awareness: Impaired Awareness Impairment: Intellectual impairment Problem Solving: Impaired Problem Solving Impairment: Verbal basic;Functional basic Safety/Judgment: Impaired Sensation Sensation Light Touch: Appears Intact (LEs) Coordination Gross Motor Movements are Fluid and Coordinated: Yes Fine Motor Movements are Fluid and Coordinated: No Motor  Motor Motor - Skilled Clinical Observations: generalized weakness, deconditioning  Mobility Bed Mobility Bed Mobility: Rolling Right;Right Sidelying to Sit Rolling Right: 3: Mod assist Rolling Right Details: Manual facilitation for placement;Verbal cues for technique;Verbal cues for precautions/safety;Verbal cues for safe use of DME/AE Right Sidelying to Sit: 2: Max assist Right Sidelying to Sit Details: Verbal cues for technique;Verbal cues for  precautions/safety;Verbal cues for safe use of DME/AE;Manual facilitation for weight shifting;Manual facilitation for placement;Manual facilitation for weight bearing Right Sidelying to Sit Details (indicate cue type and reason): assist to bring LEs over EOB and elevate trunk while maintaining sternal precautions Transfers Transfers: Yes Sit to Stand: 3: Mod assist;1: +2 Total assist (mod assist from EOB and BSC, +2 from w/c) Sit to Stand Details: Manual facilitation for weight shifting;Verbal cues for sequencing;Verbal cues for precautions/safety;Verbal cues for safe use of DME/AE;Verbal cues for technique Stand to Sit: 4: Min assist Stand to Sit Details (indicate cue type and reason): Verbal cues for technique;Verbal cues for precautions/safety;Verbal  cues for safe use of DME/AE;Manual facilitation for placement;Manual facilitation for weight shifting;Manual facilitation for weight bearing Stand Pivot Transfers: 3: Mod assist Stand Pivot Transfer Details: Manual facilitation for weight shifting;Manual facilitation for placement;Verbal cues for safe use of DME/AE;Verbal cues for technique;Verbal cues for precautions/safety Locomotion  Ambulation Ambulation: Yes Ambulation/Gait Assistance: 3: Mod assist Ambulation Distance (Feet): 80 Feet Assistive device: Eva walker Ambulation/Gait Assistance Details: Verbal cues for gait pattern;Verbal cues for safe use of DME/AE  Trunk/Postural Assessment  Cervical Assessment Cervical Assessment: Exceptions to [facility] (forward head posture) Thoracic Assessment Thoracic Assessment: Exceptions to [facility] (rounded shoulders) Lumbar Assessment Lumbar Assessment: Exceptions to [facility] (decreased lumbar lordosis, posterior pelvic tilt) Postural Control Postural Control: Deficits on evaluation Righting Reactions: absent, posterior lean in standing Protective Responses: delayed and insufficient  Balance Balance Balance Assessed: Yes Static Sitting Balance Static  Sitting - Balance Support: No upper extremity supported Static Sitting - Level of Assistance: 4: Min assist Dynamic Sitting Balance Dynamic Sitting - Level of Assistance: 3: Mod assist Static Standing Balance Static Standing - Balance Support: No upper extremity supported Static Standing - Level of Assistance: 4: Min assist Dynamic Standing Balance Dynamic Standing - Balance Support: Bilateral upper extremity supported Dynamic Standing - Level of Assistance: 3: Mod assist Extremity Assessment      RLE Assessment RLE Assessment: Not tested LLE Assessment LLE Assessment: Not tested   See Function Navigator for Current Functional Status.   Refer to Care Plan for Long Term Goals  Recommendations for other services: Neuropsych  Discharge Criteria: Patient will be discharged from PT if patient refuses treatment 3 consecutive times without medical reason, if treatment goals not met, if there is a change in medical status, if patient makes no progress towards goals or if patient is discharged from hospital.  The above assessment, treatment plan, treatment alternatives and goals were discussed and mutually agreed upon: by patient and by family  Michel Santee 04/27/2017, 10:53 AM

## 2017-04-27 NOTE — Evaluation (Signed)
Speech Language Pathology Assessment and Plan  Patient Details  Name: Amanda [name] MRN: [id number] Date of Birth: 08/25/1940  SLP Diagnosis: Cognitive Impairments;Dysphagia  Rehab Potential: Good ELOS: 14-21 days     Today's Date: 04/27/2017 SLP Individual Time: 1405-1500 SLP Individual Time Calculation (min): 55 min   Problem List:  Patient Active Problem List   Diagnosis Date Noted  . Subtherapeutic anticoagulation   . Sternal pain   . SOB (shortness of breath)   . Stage 3 chronic kidney disease ([place])   . Debility 04/26/2017  . History of CVA (cerebrovascular accident)   . S/P placement of cardiac pacemaker   . Post-operative pain   . Labile blood glucose   . Hypernatremia   . S/P minimally invasive mitral valve repair + maze procedure 04/16/2017  . S/P minimally invasive maze operation for atrial fibrillation 04/16/2017  . DOE (dyspnea on exertion) 03/05/2017  . Paroxysmal atrial fibrillation (HCC)   . Acute delirium 04/28/2015  . Acute UTI 04/01/2015  . Acute encephalopathy 04/01/2015  . AKI (acute kidney injury) ([place]) 04/01/2015  . Leukocytosis 04/01/2015  . Thrombocytopenia ([place]) 04/01/2015  . Hyponatremia 04/01/2015  . Hypothyroidism 04/01/2015  . CKD (chronic kidney disease), stage III ([place]) 04/01/2015  . UTI (lower urinary tract infection)   . Severe sepsis without septic shock ([place])   . Pacemaker 02/03/2015  . AV block 01/06/2015  . RBBB 05/21/2014  . Mitral regurgitation 11/26/2013  . PAC (premature atrial contraction)   . MVP (mitral valve prolapse) 11/11/2013  . DIARRHEA 07/24/2010  . GLOMERULONEPHRITIS 03/27/2010  . COLITIS 10/06/2009  . Essential hypertension 10/05/2009  . DYSPHAGIA UNSPECIFIED 10/05/2009  . Personal history of other endocrine, metabolic, and immunity disorders 10/05/2009   Past Medical History:  Past Medical History:  Diagnosis Date  . Allergic rhinitis   . Anemia   . CKD (chronic kidney disease), stage III ([place])     stage III  . DJD (degenerative joint disease)   . Glomerulonephritis    Dr Jimmy Footman  . Hyperlipidemia   . Hyperparathyroidism ([place])   . Hypertension   . Hypothyroidism   . IBS (irritable bowel syndrome)   . Interstitial cystitis   . Mitral regurgitation   . Mobitz II    a. s/p STJ dual chamber PPM   . MVP (mitral valve prolapse)    moderate posterior MVP with moderate MR and grade II diasotlic dysfunction  . PAC (premature atrial contraction)   . PAF (paroxysmal atrial fibrillation) ([place])     note on pacer check. CHADS2VASC score is 4 now on Eliquis.  . Paroxysmal atrial fibrillation (HCC)    CHADS2VASC score is 4  . PVC's (premature ventricular contractions)   . S/P minimally invasive maze operation for atrial fibrillation 04/16/2017   Complete bilateral atrial lesion set using cryothermy and bipolar radiofrequency ablation with clipping of LA appendage via right mini thoracotomy approach  . S/P minimally invasive mitral valve repair 04/16/2017   Complex valvuloplasty including triangular resection of posterior leaflet, artificial Gore-tex neochords x6 and Sorin Memo 3D ring annuloplasty ([id number], size 32, serial # [id number])  . S/P placement of cardiac pacemaker   . Small vessel disease, cerebrovascular    Past Surgical History:  Past Surgical History:  Procedure Laterality Date  . ABDOMINAL HYSTERECTOMY    . BREAST BIOPSY Right   . CLIPPING OF ATRIAL APPENDAGE  04/16/2017   Procedure: CLIPPING OF ATRIAL APPENDAGE using AtriCure Pro2 clip 45;  Surgeon: [name], MD;  Location:  [facility] OR;  Service: Open Heart Surgery;;  . CYSTOSTOMY W/ BLADDER BIOPSY    . EP IMPLANTABLE DEVICE N/A 01/07/2015   STJ dual chamber pacemaker implanted by Dr Lovena Le for 2:1 heart block  . MINIMALLY INVASIVE MAZE PROCEDURE N/A 04/16/2017   Procedure: MINIMALLY INVASIVE MAZE PROCEDURE;  Surgeon: [name], MD;  Location: [facility];  Service: Open Heart Surgery;  Laterality: N/A;  . MITRAL VALVE REPAIR  Right 04/16/2017   Procedure: MINIMALLY INVASIVE MITRAL VALVE REPAIR (MVR);  Surgeon: [name], MD;  Location: [facility];  Service: Open Heart Surgery;  Laterality: Right;  . MITRAL VALVE REPAIR Right 04/16/2017   Procedure: RE-EXPLORATION RIGHT THORACOTOMY FOR BLEEDING;  Surgeon: [name], MD;  Location: [facility];  Service: Open Heart Surgery;  Laterality: Right;  . PERIPHERAL VASCULAR CATHETERIZATION N/A 02/03/2015   Procedure: Upper Extremity Venography;  Surgeon: Deboraha Sprang, MD;  Location: [facility];  Service: Cardiovascular;  Laterality: N/A;  . RIGHT/LEFT HEART CATH AND CORONARY ANGIOGRAPHY N/A 03/26/2017   Procedure: RIGHT/LEFT HEART CATH AND CORONARY ANGIOGRAPHY;  Surgeon: [name], MD;  Location: [facility];  Service: Cardiovascular;  Laterality: N/A;  . TEE WITHOUT CARDIOVERSION N/A 03/18/2017   Procedure: TRANSESOPHAGEAL ECHOCARDIOGRAM (TEE);  Surgeon: Skeet Latch, MD;  Location: [facility];  Service: Cardiovascular;  Laterality: N/A;  . TEE WITHOUT CARDIOVERSION N/A 04/16/2017   Procedure: TRANSESOPHAGEAL ECHOCARDIOGRAM (TEE);  Surgeon: [name], MD;  Location: [facility];  Service: Open Heart Surgery;  Laterality: N/A;  . TONSILLECTOMY      Assessment / Plan / Recommendation Clinical Impression   Amanda [name] is a 76 y.o. right handed female with history of mitral valve prolapse and mitral regurgitation, CVA, hypertension, stage III CKD, heart block with pacemaker, PAF on long-term anticoagulation/Eliquis. Patient lives with spouse. Reportedly independent and driving prior to admission. Presented 04/15/2017 for evaluation and treatment of symptomatic mitral regurgitation. Recent echocardiogram with normal left ventricular systolic function mild to moderate mitral regurgitation. Follow-up echo showed significant worsening in severity of mitral regurgitation. Underwent Maze procedure 04/16/2017 per Dr. Roxy Manns. Postoperatively patient with  150-250 mL of chest tube output per hour and underwent reexploration of right mini thoracotomy for any bleeding same date 04/16/2017 with no findings of reaccumulated blood with ongoing bleeding Hospital course pain management. Sternal precautions as directed.  Hospital course leukocytosis 21,600 and monitored with latest of 17,200 . Intermittent bouts of confusion and restlessness. Cranial CT scan reviewed, unremarkable for acute process. Subcutaneous Lovenox for DVT prophylaxis and transitioned to Coumadin. Initially maintained on TPN for nutritional support and diet advanced to a dysphagia #2 thin liquid diet Physical and occupational therapy evaluation completed with recommendations of physical medicine rehabilitation consult. Patient was admitted for a comprehensive rehabilitation program.  SLP evaluation completed on 04/27/2017 with the following results:  Pt presents wtih a mild dysphagia with cognitive and esophageal components.  Pt with delayed throate clearing after consumption of thin liquids which appears consistent wtih reports of esophageal dysmotility.  Pt had impaired mastication of dys 2 solids which appeared to be related to xerostomia and mouth pain and resulted in diffuse, trace residuals.  For now, recommend that pt remain on her current diet with full supervision for use of swallowing precautions.   Pt also presents with significant cognitive deficits.  Pt is oriented to self only but is stimulable for orientation to place.  Deficits are characterized by decreased sustained attention to tasks which impacts all  higher level cognitive processes.   As a result, pt would benefit from skilled ST while inpatient in order to maximize functional independence and reduce burden of care prior to discharge.  Anticipate that pt will need 24/7 supervision at discharge in addition to [place] follow up at next level of care.   Skilled Therapeutic Interventions          Cognitive-linguistic evaluation completed  with results and recommendations reviewed with family.  Pt needed max to total assist to follow simple 2 step commands due to perseveration and decreased attention to task.  Pt needed max to total assist to orient to place, date, and situation.  Discussed pt's current cognitive limitations with pt's daughter and provided her with recommendations for maximizing pt's potential for cognitive remediation and/or compensation.  All questions were answered to her satisfaction at this time.      SLP Assessment  Patient will need skilled Speech Lanaguage Pathology Services during CIR admission    Recommendations  SLP Diet Recommendations: Dysphagia 2 (Fine chop);Thin Liquid Administration via: Cup;Straw Medication Administration: Whole meds with puree Supervision: Full supervision/cueing for compensatory strategies;Patient able to self feed Compensations: Slow rate;Small sips/bites;Minimize environmental distractions Postural Changes and/or Swallow Maneuvers: Seated upright 90 degrees Oral Care Recommendations: Oral care BID Recommendations for Other Services: Therapeutic Recreation consult Therapeutic Recreation Interventions: Pet therapy Patient destination: Home Follow up Recommendations: 24 hour supervision/assistance;Home Health SLP;Outpatient SLP Equipment Recommended: None recommended by SLP    SLP Frequency 3 to 5 out of 7 days   SLP Duration  SLP Intensity  SLP Treatment/Interventions 14-21 days   Minumum of 1-2 x/day, 30 to 90 minutes  Cognitive remediation/compensation;Cueing hierarchy;Functional tasks;Dysphagia/aspiration precaution training;Internal/external aids;Patient/family education    Pain Pain Assessment Pain Assessment: No/denies pain  Prior Functioning Cognitive/Linguistic Baseline: Within functional limits Type of Home: House  Lives With: Spouse;Daughter Available Help at Discharge: Family;Available 24 hours/day Vocation: Retired  Function:  Eating Eating    Modified Consistency Diet: Yes Eating Assist Level: Supervision or verbal cues           Cognition Comprehension Comprehension assist level: Understands basic 25 - 49% of the time/ requires cueing 50 - 75% of the time  Expression   Expression assist level: Expresses basic 50 - 74% of the time/requires cueing 25 - 49% of the time. Needs to repeat parts of sentences.  Social Interaction Social Interaction assist level: Interacts appropriately 25 - 49% of time - Needs frequent redirection.  Problem Solving Problem solving assist level: Solves basic 25 - 49% of the time - needs direction more than half the time to initiate, plan or complete simple activities  Memory Memory assist level: Recognizes or recalls 25 - 49% of the time/requires cueing 50 - 75% of the time   Short Term Goals: Week 1: SLP Short Term Goal 1 (Week 1): Pt will consume dys 2 textures and thin liquids with min verbal cues to clear solids from the oral cavity.   SLP Short Term Goal 2 (Week 1): Pt will sustain her attention to basic tasks for 5 minutes with mod verbal cues for redirection.   SLP Short Term Goal 3 (Week 1): Pt will utilize external aids to orient to place, date, and situation with mod assist verbal cues.  SLP Short Term Goal 4 (Week 1): Pt will complete basic, familiar tasks with mod verbal cues for functional problem solving.   SLP Short Term Goal 5 (Week 1): Pt will identify at least 2 deficits occurring s/p hospitalization  with mod question cues.   SLP Short Term Goal 6 (Week 1): Pt will return demonstration of at least 2 safety precautions during functional tasks with mod verbal cues.  Refer to Care Plan for Long Term Goals  Recommendations for other services: Therapeutic Recreation  Pet therapy  Discharge Criteria: Patient will be discharged from SLP if patient refuses treatment 3 consecutive times without medical reason, if treatment goals not met, if there is a change in medical status, if patient  makes no progress towards goals or if patient is discharged from hospital.  The above assessment, treatment plan, treatment alternatives and goals were discussed and mutually agreed upon: by patient and by family  Hallis Meditz, Selinda Orion 04/27/2017, 4:15 PM

## 2017-04-27 NOTE — Progress Notes (Addendum)
[facility] PHYSICAL MEDICINE & REHABILITATION     PROGRESS NOTE  Subjective/Complaints:  Patient seen lying in bed this morning. She states she slept well overnight. She states she feels she'll) morning.  ROS: +SOB. Denies CP, nausea, vomiting, diarrhea.  Objective: Vital Signs: Blood pressure (!) 131/49, pulse 84, temperature 97.8 F (36.6 C), temperature source Oral, resp. rate 18, height 5' 6.5" (1.689 m), weight 63.5 kg (140 lb), SpO2 100 %. No results found.  Recent Labs  04/25/17 0338 04/26/17 0305  WBC 17.2* 16.1*  HGB 12.1 11.2*  HCT 39.1 36.5  PLT 85* 100*    Recent Labs  04/25/17 0338 04/26/17 0305  NA 151* 149*  K 3.0* 3.8  CL 109 115*  GLUCOSE 121* 153*  BUN 77* 62*  CREATININE 1.74* 1.71*  CALCIUM 9.0 8.5*   CBG (last 3)   Recent Labs  04/26/17 1339 04/26/17 1445 04/26/17 1659  GLUCAP 54* 108* 106*    Wt Readings from Last 3 Encounters:  04/27/17 63.5 kg (140 lb)  04/26/17 62.9 kg (138 lb 9.6 oz)  04/15/17 64.4 kg (142 lb)    Physical Exam:  BP (!) 131/49 (BP Location: Left Arm)   Pulse 84   Temp 97.8 F (36.6 C) (Oral)   Resp 18   Ht 5' 6.5" (1.689 m)   Wt 63.5 kg (140 lb)   SpO2 100%   BMI 22.26 kg/m  Constitutional: She appears well-nourished. No distress.  HENT: Normocephalic. Atraumatic. Eyes: EOM are normal. No discharge. Cardiovascular: Normal rate and regular rhythm. No JVD. Respiratory: Limited inspiratory effort but clear to auscultation  GI: Bowel sounds are normal. She exhibits no distension. Musculoskeletal: She exhibits tenderness (along - left chest wall. She exhibits no edema.  Skin. Warm and dry  Neurological: She is alert.  Motor: LUE/LLE: 4+/5 proximal to distal  RUE/RLE: 4/5 proximal to distal.  Psych :generally pleasant and cooperative   Assessment/Plan: 1. Functional deficits secondary to debility which require 3+ hours per day of interdisciplinary therapy in a comprehensive inpatient rehab  setting. Physiatrist is providing close team supervision and 24 hour management of active medical problems listed below. Physiatrist and rehab team continue to assess barriers to discharge/monitor patient progress toward functional and medical goals.  Function:  Bathing Bathing position      Bathing parts      Bathing assist        Upper Body Dressing/Undressing Upper body dressing                    Upper body assist        Lower Body Dressing/Undressing Lower body dressing                                  Lower body assist        Toileting Toileting          Toileting assist     Transfers Chair/bed transfer             Locomotion Ambulation           Wheelchair          Cognition Comprehension Comprehension assist level: Understands basic less than 25% of the time/ requires cueing >75% of the time  Expression Expression assist level: Expresses basis less than 25% of the time/requires cueing >75% of the time.  Social Interaction Social Interaction assist level: Interacts appropriately less than 25%  of the time. May be withdrawn or combative.  Problem Solving Problem solving assist level: Solves basic less than 25% of the time - needs direction nearly all the time or does not effectively solve problems and may need a restraint for safety  Memory Memory assist level: Recognizes or recalls less than 25% of the time/requires cueing greater than 75% of the time    Medical Problem List and Plan:  1. Debility secondary to Maze procedure for symptomatic mitral regurgitation 04/16/2017 with sternal precautions/multi-medical. Ongoing confusion   Monitor clinically for persistent/consistent symptoms  May need to discuss with surgical team feasability of MRI after Maze procedure if necessary.    Begin CIR  Notes reviewed, images reviewed, discussed with admitting physician 2. DVT Prophylaxis/Anticoagulation: Coumadin per pharmacy protocol    INR subtherapeutic on 10/20 3. Pain Management: Tylenol as needed  4. Mood: Seroquel 50 mg daily at bedtime. Cranial CT reviewed, negative for acute changes  5. Neuropsych: This patient is not fully capable of making decisions on his own behalf.  6. Skin/Wound Care: Routine skin checks  7. Fluids/Electrolytes/Nutrition: Routine I&Os 8. Dysphagia. Dysphagia 2 thin liquids. Follow-up speech therapy  9. CKD stage III. Followed in the past by Dr. Darrick Pennaeterding   Creatinine 1.71 on 10/19 10. Hypertension. Tenormin 50 mg daily    Monitor with increased mobility 11. Leukocytosis. Improving.   WBC 16.1 on 10/19  12. Hypothyroidism. Synthroid  13. SOB  CXR ordered  LOS (Days) 1 A FACE TO FACE EVALUATION WAS PERFORMED  Amanda [name] 04/27/2017 8:14 AM

## 2017-04-27 NOTE — Progress Notes (Signed)
ANTICOAGULATION CONSULT NOTE - Follow Up Consult  Pharmacy Consult for warfarin Indication: atrial fibrillation  Allergies  Allergen Reactions  . Pepcid [Famotidine] Swelling and Other (See Comments)    "Throat Swelling"   . Allopurinol Other (See Comments)    Caused headaches  . Amlodipine Other (See Comments)    PEDAL EDEMA   . Colchicine Other (See Comments)    UNSPECIFIED REACTION   . Contrast Media [Iodinated Diagnostic Agents] Hives    IVP dye per patient  . Prilosec [Omeprazole] Nausea Only  . Atorvastatin Other (See Comments)    UNSPECIFIED REACTION   . Cephalexin Itching and Rash  . Ciprofloxacin Itching and Rash  . Erythromycin Itching and Rash  . Sulfonamide Derivatives Itching and Rash  . Tetracycline Itching and Rash    Patient Measurements: Height: 5' 6.5" (168.9 cm) Weight: 140 lb (63.5 kg) IBW/kg (Calculated) : 60.45  Vital Signs: Temp: 97.8 F (36.6 C) (10/20 0412) Temp Source: Oral (10/20 0412) BP: 131/49 (10/20 0412) Pulse Rate: 84 (10/20 0412)  Labs:  Recent Labs  04/25/17 0338 04/26/17 0305 04/27/17 0525  HGB 12.1 11.2*  --   HCT 39.1 36.5  --   PLT 85* 100*  --   LABPROT 19.7* 22.9* 19.5*  INR 1.69 2.04 1.67  CREATININE 1.74* 1.71*  --     Estimated Creatinine Clearance: 25.9 mL/min (A) (by C-G formula based on SCr of 1.71 mg/dL (H)).   Medical History: Past Medical History:  Diagnosis Date  . Allergic rhinitis   . Anemia   . CKD (chronic kidney disease), stage III ([facility])    stage III  . DJD (degenerative joint disease)   . Glomerulonephritis    Dr Darrick Pennaeterding  . Hyperlipidemia   . Hyperparathyroidism ([facility])   . Hypertension   . Hypothyroidism   . IBS (irritable bowel syndrome)   . Interstitial cystitis   . Mitral regurgitation   . Mobitz II    a. s/p STJ dual chamber PPM   . MVP (mitral valve prolapse)    moderate posterior MVP with moderate MR and grade II diasotlic dysfunction  . PAC (premature atrial contraction)    . PAF (paroxysmal atrial fibrillation) ([facility])     note on pacer check. CHADS2VASC score is 4 now on Eliquis.  . Paroxysmal atrial fibrillation ([facility])    CHADS2VASC score is 4  . PVC's (premature ventricular contractions)   . S/P minimally invasive maze operation for atrial fibrillation 04/16/2017   Complete bilateral atrial lesion set using cryothermy and bipolar radiofrequency ablation with clipping of LA appendage via right mini thoracotomy approach  . S/P minimally invasive mitral valve repair 04/16/2017   Complex valvuloplasty including triangular resection of posterior leaflet, artificial Gore-tex neochords x6 and Sorin Memo 3D ring annuloplasty ([id number], size 32, serial # [id number])  . S/P placement of cardiac pacemaker   . Small vessel disease, cerebrovascular     Medications:  Prescriptions Prior to Admission  Medication Sig Dispense Refill Last Dose  . atenolol (TENORMIN) 50 MG tablet Take 1 tablet (50 mg total) by mouth daily. (Patient taking differently: Take 50 mg by mouth 2 (two) times daily. ) 30 tablet 11 04/16/2017 at 0400  . conjugated estrogens (PREMARIN) vaginal cream Place 1 Applicatorful vaginally every 3 (three) days.    04/15/2017 at Unknown time  . fluticasone (FLONASE) 50 MCG/ACT nasal spray Place 2 sprays into both nostrils daily.   04/16/2017 at 0400  . hydrocortisone valerate ointment (WEST-CORT) 0.2 % Apply 1 application  topically 2 (two) times daily as needed (for itching).    More than a month at Unknown time  . levocetirizine (XYZAL) 5 MG tablet Take 5 mg by mouth every evening.   04/15/2017 at Unknown time  . levothyroxine (SYNTHROID, LEVOTHROID) 75 MCG tablet Take 75 mcg by mouth See admin instructions. Take 75 mcg by mouth daily around 0300   04/16/2017 at 0400  . Maltodextrin-Xanthan Gum (RESOURCE THICKENUP CLEAR) POWD Nectar thick consistency     . NON FORMULARY 1 each by Other route See admin instructions. Allergy injections once weekly   04/15/2017 at Unknown time   . Probiotic Product (PROBIOTIC DAILY PO) Take 1 tablet by mouth daily.   04/15/2017 at Unknown time  . QUEtiapine (SEROQUEL) 50 MG tablet Take 1 tablet (50 mg total) by mouth at bedtime.     [name] warfarin (COUMADIN) 1 MG tablet Take 1 tablet (1 mg total) by mouth daily at 6 PM.       Assessment: Amanda [name] with h/o Afib on warfarin at home s/p mitral valve repair and clipping of atrial appendage. Patient has received 4 doses of warfarin and INR was therapeutic at 2.04. H/H trending down, Plt low but improved to 100k  warfrin dose missed last pm INR 1.67 - will restart warfarin 2.5mg  x1 No significant drug interactions noted.   Goal of Therapy:  INR 2-3 Monitor platelets by anticoagulation protocol: Yes   Plan:  -Warfarin 2.5 mg x 1 dose tonight -Monitor daily PT/INR   Leota Sauers Pharm.D. CPP, BCPS Clinical Pharmacist [phone] 04/27/2017 2:21 PM

## 2017-04-27 NOTE — Evaluation (Signed)
Occupational Therapy Assessment and Plan  Patient Details  Name: Amanda [name] MRN: [id number] Date of Birth: [birth year]  OT Diagnosis: abnormal posture, cognitive deficits, muscle weakness (generalized) and coordination disorder Rehab Potential: Rehab Potential (ACUTE ONLY): Good ELOS: 16-18   Today's Date: 04/27/2017 OT Individual Time: 1000-1115 OT Individual Time Calculation (min): 75 min     Problem List:  Patient Active Problem List   Diagnosis Date Noted  . Subtherapeutic anticoagulation   . Debility 04/26/2017  . History of CVA (cerebrovascular accident)   . S/P placement of cardiac pacemaker   . Post-operative pain   . Labile blood glucose   . Hypernatremia   . S/P minimally invasive mitral valve repair + maze procedure 04/16/2017  . S/P minimally invasive maze operation for atrial fibrillation 04/16/2017  . DOE (dyspnea on exertion) 03/05/2017  . Paroxysmal atrial fibrillation (HCC)   . Acute delirium 04/28/2015  . Acute UTI 04/01/2015  . Acute encephalopathy 04/01/2015  . AKI (acute kidney injury) ([place]) 04/01/2015  . Leukocytosis 04/01/2015  . Thrombocytopenia ([place]) 04/01/2015  . Hyponatremia 04/01/2015  . Hypothyroidism 04/01/2015  . CKD (chronic kidney disease), stage III ([place]) 04/01/2015  . UTI (lower urinary tract infection)   . Severe sepsis without septic shock ([place])   . Pacemaker 02/03/2015  . AV block 01/06/2015  . RBBB 05/21/2014  . Mitral regurgitation 11/26/2013  . PAC (premature atrial contraction)   . MVP (mitral valve prolapse) 11/11/2013  . DIARRHEA 07/24/2010  . GLOMERULONEPHRITIS 03/27/2010  . COLITIS 10/06/2009  . Essential hypertension 10/05/2009  . DYSPHAGIA UNSPECIFIED 10/05/2009  . Personal history of other endocrine, metabolic, and immunity disorders 10/05/2009    Past Medical History:  Past Medical History:  Diagnosis Date  . Allergic rhinitis   . Anemia   . CKD (chronic kidney disease), stage III ([place])    stage III  .  DJD (degenerative joint disease)   . Glomerulonephritis    Dr Jimmy Footman  . Hyperlipidemia   . Hyperparathyroidism ([place])   . Hypertension   . Hypothyroidism   . IBS (irritable bowel syndrome)   . Interstitial cystitis   . Mitral regurgitation   . Mobitz II    a. s/p STJ dual chamber PPM   . MVP (mitral valve prolapse)    moderate posterior MVP with moderate MR and grade II diasotlic dysfunction  . PAC (premature atrial contraction)   . PAF (paroxysmal atrial fibrillation) ([place])     note on pacer check. CHADS2VASC score is 4 now on Eliquis.  . Paroxysmal atrial fibrillation (HCC)    CHADS2VASC score is 4  . PVC's (premature ventricular contractions)   . S/P minimally invasive maze operation for atrial fibrillation 04/16/2017   Complete bilateral atrial lesion set using cryothermy and bipolar radiofrequency ablation with clipping of LA appendage via right mini thoracotomy approach  . S/P minimally invasive mitral valve repair 04/16/2017   Complex valvuloplasty including triangular resection of posterior leaflet, artificial Gore-tex neochords x6 and Sorin Memo 3D ring annuloplasty ([id number], size 32, serial # [id number])  . S/P placement of cardiac pacemaker   . Small vessel disease, cerebrovascular    Past Surgical History:  Past Surgical History:  Procedure Laterality Date  . ABDOMINAL HYSTERECTOMY    . BREAST BIOPSY Right   . CLIPPING OF ATRIAL APPENDAGE  04/16/2017   Procedure: CLIPPING OF ATRIAL APPENDAGE using AtriCure Pro2 clip 45;  Surgeon: [name], MD;  Location: [facility];  Service: Open Heart Surgery;;  . CYSTOSTOMY W/  BLADDER BIOPSY    . EP IMPLANTABLE DEVICE N/A 01/07/2015   STJ dual chamber pacemaker implanted by Dr Lovena Le for 2:1 heart block  . MINIMALLY INVASIVE MAZE PROCEDURE N/A 04/16/2017   Procedure: MINIMALLY INVASIVE MAZE PROCEDURE;  Surgeon: [name], MD;  Location: [facility];  Service: Open Heart Surgery;  Laterality: N/A;  . MITRAL VALVE REPAIR Right 04/16/2017    Procedure: MINIMALLY INVASIVE MITRAL VALVE REPAIR (MVR);  Surgeon: [name], MD;  Location: [facility];  Service: Open Heart Surgery;  Laterality: Right;  . MITRAL VALVE REPAIR Right 04/16/2017   Procedure: RE-EXPLORATION RIGHT THORACOTOMY FOR BLEEDING;  Surgeon: [name], MD;  Location: [facility];  Service: Open Heart Surgery;  Laterality: Right;  . PERIPHERAL VASCULAR CATHETERIZATION N/A 02/03/2015   Procedure: Upper Extremity Venography;  Surgeon: Deboraha Sprang, MD;  Location: [facility];  Service: Cardiovascular;  Laterality: N/A;  . RIGHT/LEFT HEART CATH AND CORONARY ANGIOGRAPHY N/A 03/26/2017   Procedure: RIGHT/LEFT HEART CATH AND CORONARY ANGIOGRAPHY;  Surgeon: [name], MD;  Location: [facility];  Service: Cardiovascular;  Laterality: N/A;  . TEE WITHOUT CARDIOVERSION N/A 03/18/2017   Procedure: TRANSESOPHAGEAL ECHOCARDIOGRAM (TEE);  Surgeon: Skeet Latch, MD;  Location: [facility];  Service: Cardiovascular;  Laterality: N/A;  . TEE WITHOUT CARDIOVERSION N/A 04/16/2017   Procedure: TRANSESOPHAGEAL ECHOCARDIOGRAM (TEE);  Surgeon: [name], MD;  Location: [facility];  Service: Open Heart Surgery;  Laterality: N/A;  . TONSILLECTOMY      Assessment & Plan Clinical Impression:  Amanda [name] is a 76 y.o. right handed female with history of mitral valve prolapse and mitral regurgitation, CVA, hypertension, stage III CKD, heart block with pacemaker, PAF on long-term anticoagulation/Eliquis. Patient lives with spouse. Reportedly independent and driving prior to admission. Presented 04/15/2017 for evaluation and treatment of symptomatic mitral regurgitation. Recent echocardiogram with normal left ventricular systolic function mild to moderate mitral regurgitation. Follow-up echo showed significant worsening in severity of mitral regurgitation. Underwent Maze procedure 04/16/2017 per Dr. Roxy Manns. Postoperatively patient with 150-250 mL of chest tube output per  hour and underwent reexploration of right mini thoracotomy for any bleeding same date 04/16/2017 with no findings of reaccumulated blood with ongoing bleeding Hospital course pain management. Sternal precautions as directed.  Hospital course leukocytosis 21,600 and monitored with latest of 17,200 . Intermittent bouts of confusion and restlessness. Cranial CT scan reviewed, unremarkable for acute process. Subcutaneous Lovenox for DVT prophylaxis and transitioned to Coumadin. Initially maintained on TPN for nutritional support and diet advanced to a dysphagia #2 thin liquid diet Physical and occupational therapy evaluation completed with recommendations of physical medicine rehabilitation consult. Patient was admitted for a comprehensive rehabilitation program  Patient currently requires mod with basic self-care skills secondary to muscle weakness, decreased cardiorespiratoy endurance, decreased attention, decreased awareness, decreased problem solving, decreased safety awareness, decreased memory and delayed processing and decreased standing balance, decreased postural control, decreased balance strategies and difficulty maintaining precautions.  Prior to hospitalization, patient could complete BADL/IADL with independent .  Patient will benefit from skilled intervention to decrease level of assist with basic self-care skills and increase independence with basic self-care skills prior to discharge home with care partner.  Anticipate patient will require 24 hour supervision and follow up home health.  OT - End of Session Endurance Deficit: Yes OT Assessment Rehab Potential (ACUTE ONLY): Good OT Patient demonstrates impairments in the following area(s): Balance;Cognition;Endurance;Nutrition;Perception;Safety OT Basic ADL's Functional Problem(s): Grooming;Bathing;Dressing;Toileting OT Transfers Functional Problem(s): Toilet;Tub/Shower  OT Plan OT Intensity: Minimum of 1-2 x/day, 45 to 90 minutes OT  Frequency: 5 out of 7 days OT Duration/Estimated Length of Stay: 16-18 OT Treatment/Interventions: Balance/vestibular training;Discharge planning;Self Care/advanced ADL retraining;Therapeutic Activities;UE/LE Coordination activities;Therapeutic Exercise;Patient/family education;Functional mobility training;Disease mangement/prevention;Cognitive remediation/compensation;DME/adaptive equipment instruction;Psychosocial support;UE/LE Strength taining/ROM;Wheelchair propulsion/positioning OT Self Feeding Anticipated Outcome(s): MOD I OT Basic Self-Care Anticipated Outcome(s): S OT Toileting Anticipated Outcome(s): S OT Bathroom Transfers Anticipated Outcome(s): S OT Recommendation Patient destination: Home Follow Up Recommendations: Home health OT Equipment Recommended: To be determined;Tub/shower bench;3 in 1 bedside comode   Skilled Therapeutic Intervention 1:1. Pt and family educated on role/purpose of OT, CIR, POC, and ELOS. Pt bathes seated in w/c at sit to stand level with MOD A for sit to stand and VC for sternal precautions. Pt requires VC for sequencing bathing body parts. Pt able to assume seated figure 4 for washing BLE. Pt dons pull over shirt with A to pull shirt down back. Pt dons pants backwards. OT doffs pants and on second attempt pt then threads BLE into same pant leg. Pt unable to problem solve LB dressing and requires A to orient clothing and A to thread LLE. Pt sit to stand as stated above with A to pull pants past hips. Pt dons socks/shoes in seated figure 4 with VC to pull sock past heel as pt is fatigued and demo difficulty attending to task. OT propels w/c to ADL apartment to practice walk in shower transfer with MOD A for balance as pt leans posteriorly and VC for sequencing of transfer, safety awareness and adherence to precautions. Exited session with pt seated in w/c with family present and call light in reach.  OT Evaluation Precautions/Restrictions   Precautions Precautions: Fall;Sternal Restrictions Other Position/Activity Restrictions: sternal precautions General Chart Reviewed: Yes Vital Signs  Pain Pain Assessment Pain Assessment: No/denies pain Faces Pain Scale: Hurts a little bit Pain Location: Breast Pain Orientation: Right Pain Intervention(s): Repositioned;RN made aware Home Living/Prior [place] expects to be discharged to:: Private residence Living Arrangements: Spouse/significant other Available Help at Discharge: Family, Available 24 hours/day Type of Home: House Home Access: Stairs to enter Technical brewer of Steps: 2 Entrance Stairs-Rails: None Home Layout: One level Additional Comments: pt was living at home with spouse in 1 story home with 3 STE, walk in shower with seat in it  Lives With: Spouse, Daughter Prior Function Level of Independence: Independent with transfers, Independent with gait  Able to Take Stairs?: Yes Driving: Yes Vocation: Retired Leisure: Hobbies-yes (Comment) Comments: singing in the choice at church; gardening ADL   Vision Baseline Vision/History: No visual deficits Patient Visual Report: No change from baseline Additional Comments: further testing required; no apparent visual deficits Perception  Perception: Within Functional Limits Praxis Praxis: Impaired Praxis Impairment Details: Motor planning Cognition Overall Cognitive Status: Impaired/Different from baseline Arousal/Alertness: Awake/alert Orientation Level: Person;Place;Situation Person: Oriented Place: Oriented Situation: Oriented Year: Other (Comment) (1922) Month: October Day of Week: Incorrect Memory: Impaired Memory Impairment: Decreased recall of new information Immediate Memory Recall: Sock;Blue;Bed Memory Recall: Bed Attention: Sustained Sustained Attention: Impaired Sustained Attention Impairment: Verbal basic;Functional basic Awareness: Impaired Awareness  Impairment: Intellectual impairment Problem Solving: Impaired Problem Solving Impairment: Verbal basic;Functional basic Safety/Judgment: Impaired Sensation Sensation Light Touch: Appears Intact Proprioception: Impaired by gross assessment Additional Comments:  (posterior lean in standing) Coordination Gross Motor Movements are Fluid and Coordinated: Yes Fine Motor Movements are Fluid and Coordinated: No Motor  Motor Motor - Skilled Clinical Observations: generalized weakness, deconditioning Mobility  Bed Mobility Bed Mobility: Rolling Right;Right Sidelying to Sit Rolling Right: 3: Mod assist Rolling Right Details: Manual facilitation for placement;Verbal cues for technique;Verbal cues for precautions/safety;Verbal cues for safe use of DME/AE Right Sidelying to Sit: 2: Max assist Right Sidelying to Sit Details: Verbal cues for technique;Verbal cues for precautions/safety;Verbal cues for safe use of DME/AE;Manual facilitation for weight shifting;Manual facilitation for placement;Manual facilitation for weight bearing Right Sidelying to Sit Details (indicate cue type and reason): assist to bring LEs over EOB and elevate trunk while maintaining sternal precautions Transfers Sit to Stand: 3: Mod assist Sit to Stand Details: Manual facilitation for weight shifting;Verbal cues for sequencing;Verbal cues for precautions/safety;Verbal cues for safe use of DME/AE;Verbal cues for technique Stand to Sit: 4: Min assist Stand to Sit Details (indicate cue type and reason): Verbal cues for technique;Verbal cues for precautions/safety;Verbal cues for safe use of DME/AE;Manual facilitation for placement;Manual facilitation for weight shifting;Manual facilitation for weight bearing  Trunk/Postural Assessment  Cervical Assessment Cervical Assessment: Exceptions to [facility] (head forward) Thoracic Assessment Thoracic Assessment: Exceptions to [facility] (rounded shoulders) Lumbar Assessment Lumbar Assessment:  Exceptions to [facility] (posterior pelvic tilt) Postural Control Postural Control: Deficits on evaluation Righting Reactions: absent, posterior lean in standing Protective Responses: delayed and insufficient  Balance Balance Balance Assessed: Yes Static Sitting Balance Static Sitting - Balance Support: No upper extremity supported Static Sitting - Level of Assistance: 4: Min assist Dynamic Sitting Balance Dynamic Sitting - Level of Assistance: 3: Mod assist Static Standing Balance Static Standing - Balance Support: No upper extremity supported Static Standing - Level of Assistance: 4: Min assist Dynamic Standing Balance Dynamic Standing - Balance Support: Bilateral upper extremity supported Dynamic Standing - Level of Assistance: 3: Mod assist Extremity/Trunk Assessment RUE Assessment RUE Assessment: Exceptions to [facility] (sternal precautions- not formally tested) LUE Assessment LUE Assessment: Exceptions to [facility] (sternal precautions-not formally tested)   See Function Navigator for Current Functional Status.   Refer to Care Plan for Long Term Goals  Recommendations for other services: Therapeutic Recreation  Pet therapy   Discharge Criteria: Patient will be discharged from OT if patient refuses treatment 3 consecutive times without medical reason, if treatment goals not met, if there is a change in medical status, if patient makes no progress towards goals or if patient is discharged from hospital.  The above assessment, treatment plan, treatment alternatives and goals were discussed and mutually agreed upon: by patient and by family  Tonny Branch 04/27/2017, 12:54 PM

## 2017-04-28 ENCOUNTER — Inpatient Hospital Stay (HOSPITAL_COMMUNITY): Payer: Medicare Other

## 2017-04-28 DIAGNOSIS — Z7901 Long term (current) use of anticoagulants: Secondary | ICD-10-CM

## 2017-04-28 DIAGNOSIS — B370 Candidal stomatitis: Secondary | ICD-10-CM

## 2017-04-28 DIAGNOSIS — I10 Essential (primary) hypertension: Secondary | ICD-10-CM

## 2017-04-28 LAB — PROTIME-INR
INR: 2.01
Prothrombin Time: 22.6 s — ABNORMAL HIGH (ref 11.4–15.2)

## 2017-04-28 MED ORDER — WARFARIN SODIUM 1 MG PO TABS
1.0000 mg | ORAL_TABLET | Freq: Every day | ORAL | Status: DC
  Administered 2017-04-28: 1 mg via ORAL
  Filled 2017-04-28 (×2): qty 1

## 2017-04-28 NOTE — Progress Notes (Signed)
ANTICOAGULATION CONSULT NOTE - Follow Up Consult  Pharmacy Consult for warfarin Indication: atrial fibrillation  Allergies  Allergen Reactions  . Pepcid [Famotidine] Swelling and Other (See Comments)    "Throat Swelling"   . Allopurinol Other (See Comments)    Caused headaches  . Amlodipine Other (See Comments)    PEDAL EDEMA   . Colchicine Other (See Comments)    UNSPECIFIED REACTION   . Contrast Media [Iodinated Diagnostic Agents] Hives    IVP dye per patient  . Prilosec [Omeprazole] Nausea Only  . Atorvastatin Other (See Comments)    UNSPECIFIED REACTION   . Cephalexin Itching and Rash  . Ciprofloxacin Itching and Rash  . Erythromycin Itching and Rash  . Sulfonamide Derivatives Itching and Rash  . Tetracycline Itching and Rash    Patient Measurements: Height: 5' 6.5" (168.9 cm) Weight: 140 lb (63.5 kg) IBW/kg (Calculated) : 60.45  Vital Signs: Temp: 97.4 F (36.3 C) (10/21 0450) Temp Source: Axillary (10/21 0450) BP: 142/69 (10/21 0450) Pulse Rate: 89 (10/21 0450)  Labs:  Recent Labs  04/26/17 0305 04/27/17 0525 04/28/17 0534  HGB 11.2*  --   --   HCT 36.5  --   --   PLT 100*  --   --   LABPROT 22.9* 19.5* 22.6*  INR 2.04 1.67 2.01  CREATININE 1.71*  --   --     Estimated Creatinine Clearance: 25.9 mL/min (A) (by C-G formula based on SCr of 1.71 mg/dL (H)).   Medical History: Past Medical History:  Diagnosis Date  . Allergic rhinitis   . Anemia   . CKD (chronic kidney disease), stage III (HCC)    stage III  . DJD (degenerative joint disease)   . Glomerulonephritis    Dr Darrick Pennaeterding  . Hyperlipidemia   . Hyperparathyroidism (HCC)   . Hypertension   . Hypothyroidism   . IBS (irritable bowel syndrome)   . Interstitial cystitis   . Mitral regurgitation   . Mobitz II    a. s/p STJ dual chamber PPM   . MVP (mitral valve prolapse)    moderate posterior MVP with moderate MR and grade II diasotlic dysfunction  . PAC (premature atrial contraction)    . PAF (paroxysmal atrial fibrillation) (HCC)     note on pacer check. CHADS2VASC score is 4 now on Eliquis.  . Paroxysmal atrial fibrillation (HCC)    CHADS2VASC score is 4  . PVC's (premature ventricular contractions)   . S/P minimally invasive maze operation for atrial fibrillation 04/16/2017   Complete bilateral atrial lesion set using cryothermy and bipolar radiofrequency ablation with clipping of LA appendage via right mini thoracotomy approach  . S/P minimally invasive mitral valve repair 04/16/2017   Complex valvuloplasty including triangular resection of posterior leaflet, artificial Gore-tex neochords x6 and Sorin Memo 3D ring annuloplasty ([id number], size 32, serial # [id number])  . S/P placement of cardiac pacemaker   . Small vessel disease, cerebrovascular     Medications:  Prescriptions Prior to Admission  Medication Sig Dispense Refill Last Dose  . atenolol (TENORMIN) 50 MG tablet Take 1 tablet (50 mg total) by mouth daily. (Patient taking differently: Take 50 mg by mouth 2 (two) times daily. ) 30 tablet 11 04/16/2017 at 0400  . conjugated estrogens (PREMARIN) vaginal cream Place 1 Applicatorful vaginally every 3 (three) days.    04/15/2017 at Unknown time  . fluticasone (FLONASE) 50 MCG/ACT nasal spray Place 2 sprays into both nostrils daily.   04/16/2017 at 0400  . hydrocortisone  valerate ointment (WEST-CORT) 0.2 % Apply 1 application topically 2 (two) times daily as needed (for itching).    More than a month at Unknown time  . levocetirizine (XYZAL) 5 MG tablet Take 5 mg by mouth every evening.   04/15/2017 at Unknown time  . levothyroxine (SYNTHROID, LEVOTHROID) 75 MCG tablet Take 75 mcg by mouth See admin instructions. Take 75 mcg by mouth daily around 0300   04/16/2017 at 0400  . Maltodextrin-Xanthan Gum (RESOURCE THICKENUP CLEAR) POWD Nectar thick consistency     . NON FORMULARY 1 each by Other route See admin instructions. Allergy injections once weekly   04/15/2017 at Unknown time   . Probiotic Product (PROBIOTIC DAILY PO) Take 1 tablet by mouth daily.   04/15/2017 at Unknown time  . QUEtiapine (SEROQUEL) 50 MG tablet Take 1 tablet (50 mg total) by mouth at bedtime.     [name] warfarin (COUMADIN) 1 MG tablet Take 1 tablet (1 mg total) by mouth daily at 6 PM.       Assessment: 89 YOF with h/o Afib on warfarin at home s/p mitral valve repair and clipping of atrial appendage. Patient had received 4 doses of warfarin and INR was therapeutic at 2.04. H/H trending down, Plt low but improved to 100k  Warfrin dose missed on transfer and INR fell 1.67 - s/p  warfarin 2.5mg  x1 > INR 2.01 Will resume PTA warfarin dose S/p fluconazole 150mg  x1 10/20   Goal of Therapy:  INR 2-3 Monitor platelets by anticoagulation protocol: Yes   Plan:  -Warfarin 1mg  daily -Monitor daily PT/INR   Leota Sauers Pharm.D. CPP, BCPS Clinical Pharmacist [phone] 04/28/2017 1:55 PM

## 2017-04-28 NOTE — Progress Notes (Addendum)
[place] PHYSICAL MEDICINE & REHABILITATION     PROGRESS NOTE  Subjective/Complaints:  Pt seen sitting up in bed this AM.  She states she slept well overnight and feels much better this morning.  Her mouth also feels better.   ROS: Denies CP, nausea, vomiting, diarrhea.  Objective: Vital Signs: Blood pressure (!) 142/69, pulse 89, temperature (!) 97.4 F (36.3 C), temperature source Axillary, resp. rate 16, height 5' 6.5" (1.689 m), weight 63.5 kg (140 lb), SpO2 98 %. Dg Chest 2 View  Result Date: 04/27/2017 CLINICAL DATA:  Sternal pain. Patient had open heart surgery 04/16/2017. EXAM: CHEST  2 VIEW COMPARISON:  04/24/2017 CXR FINDINGS: The patient is slightly rotated on this current exam the demonstrating a small right-sided apical pneumothorax. The small peripheral right-sided pneumothorax is less apparent on this exam. Interval slight increase in right small pleural effusion with trace left pleural effusion. Mild vascular congestion is noted. Left-sided pacemaker apparatus with right atrial and right ventricular leads are stable. Patient is status post atrial appendage clipping and mitral valvular repair. Right-sided PICC line catheter tip is seen at the cavoatrial junction, unchanged in position. Stable degenerative change along the dorsal spine. IMPRESSION: 1. Re- demonstration of small 5-10% apical pneumothorax on the right. 2. Slight interval increase in bilateral pleural effusions. 3. Mild central vascular congestion. 4. Stable cardiomegaly with mitral valvular repair and atrial appendage clipping. Electronically Signed   By: Tollie Eth M.D.   On: 04/27/2017 19:42    Recent Labs  04/26/17 0305  WBC 16.1*  HGB 11.2*  HCT 36.5  PLT 100*    Recent Labs  04/26/17 0305  NA 149*  K 3.8  CL 115*  GLUCOSE 153*  BUN 62*  CREATININE 1.71*  CALCIUM 8.5*   CBG (last 3)   Recent Labs  04/26/17 1339 04/26/17 1445 04/26/17 1659  GLUCAP 54* 108* 106*    Wt Readings from  Last 3 Encounters:  04/27/17 63.5 kg (140 lb)  04/26/17 62.9 kg (138 lb 9.6 oz)  04/15/17 64.4 kg (142 lb)    Physical Exam:  BP (!) 142/69 (BP Location: Left Arm)   Pulse 89   Temp (!) 97.4 F (36.3 C) (Axillary)   Resp 16   Ht 5' 6.5" (1.689 m)   Wt 63.5 kg (140 lb)   SpO2 98%   BMI 22.26 kg/m  Constitutional: She appears well-nourished. No distress.  HENT: Normocephalic. Atraumatic. Eyes: EOM are normal. No discharge. Cardiovascular: RRR. No JVD. Respiratory: Unlabored. Clear to auscultation  GI: Bowel sounds are normal. She exhibits no distension.  Musculoskeletal: She exhibits no tenderness. She exhibits no edema.  Skin. Warm and dry. Intact  Neurological: She is alert.  Motor: LUE/LLE: 4+/5 proximal to distal  RUE/RLE: 4/5 proximal to distal (stable).  Psych: Pleasant and cooperative   Assessment/Plan: 1. Functional deficits secondary to debility which require 3+ hours per day of interdisciplinary therapy in a comprehensive inpatient rehab setting. Physiatrist is providing close team supervision and 24 hour management of active medical problems listed below. Physiatrist and rehab team continue to assess barriers to discharge/monitor patient progress toward functional and medical goals.  Function:  Bathing Bathing position   Position: Wheelchair/chair at sink  Bathing parts Body parts bathed by patient: Right arm, Left arm, Chest, Abdomen, Front perineal area, Right upper leg, Left upper leg, Left lower leg Body parts bathed by helper: Back, Buttocks  Bathing assist Assist Level: Touching or steadying assistance(Pt > 75%)  Upper Body Dressing/Undressing Upper body dressing   What is the patient wearing?: Pull over shirt/dress     Pull over shirt/dress - Perfomed by patient: Thread/unthread right sleeve, Thread/unthread left sleeve, Put head through opening Pull over shirt/dress - Perfomed by helper: Pull shirt over trunk        Upper body assist Assist  Level: Touching or steadying assistance(Pt > 75%)      Lower Body Dressing/Undressing Lower body dressing   What is the patient wearing?: Pants, Underwear, Socks, Shoes Underwear - Performed by patient: Thread/unthread right underwear leg, Thread/unthread left underwear leg, Pull underwear up/down   Pants- Performed by patient: Thread/unthread right pants leg Pants- Performed by helper: Thread/unthread left pants leg, Pull pants up/down     Socks - Performed by patient: Don/doff right sock, Don/doff left sock   Shoes - Performed by patient: Don/doff right shoe, Don/doff left shoe            Lower body assist Assist for lower body dressing: Touching or steadying assistance (Pt > 75%)      Toileting Toileting Toileting activity did not occur: No continent bowel/bladder event        Toileting assist     Transfers Chair/bed transfer   Chair/bed transfer method: Stand pivot Chair/bed transfer assist level: Maximal assist (Pt 25 - 49%/lift and lower) Chair/bed transfer assistive device:  (heart pillow)     Locomotion Ambulation     Max distance: 80 Assist level: Moderate assist (Pt 50 - 74%)   Wheelchair          Cognition Comprehension Comprehension assist level: Understands basic 25 - 49% of the time/ requires cueing 50 - 75% of the time  Expression Expression assist level: Expresses basic 50 - 74% of the time/requires cueing 25 - 49% of the time. Needs to repeat parts of sentences.  Social Interaction Social Interaction assist level: Interacts appropriately 25 - 49% of time - Needs frequent redirection.  Problem Solving Problem solving assist level: Solves basic 25 - 49% of the time - needs direction more than half the time to initiate, plan or complete simple activities  Memory Memory assist level: Recognizes or recalls 25 - 49% of the time/requires cueing 50 - 75% of the time    Medical Problem List and Plan:  1. Debility secondary to Maze procedure for  symptomatic mitral regurgitation 04/16/2017 with sternal precautions/multi-medical. Ongoing confusion   Monitor clinically for persistent/consistent symptoms  May need to discuss with surgical team feasability of MRI after Maze procedure if necessary.    Cont CIR 2. DVT Prophylaxis/Anticoagulation: Coumadin per pharmacy protocol   INR therapeutic on 10/21 3. Pain Management: Tylenol as needed  4. Mood: Seroquel 50 mg daily at bedtime. Cranial CT reviewed, negative for acute changes  5. Neuropsych: This patient is not fully capable of making decisions on his own behalf.  6. Skin/Wound Care: Routine skin checks  7. Fluids/Electrolytes/Nutrition: Routine I&Os 8. Dysphagia. Dysphagia 2 thin liquids. Follow-up speech therapy  9. CKD stage III. Followed in the past by Dr. Darrick Pennaeterding   Creatinine 1.71 on 10/19 10. Hypertension. Tenormin 50 mg daily   Relatively controlled on 10/21 11. Leukocytosis. Improving.   WBC 16.1 on 10/19  12. Hypothyroidism. Synthroid  13. SOB: Improved  CXR reviewed, showing small pneumothorax and pleural effusions  Cont to monitor and consider intervention if symptoms get worse, currently clinically improving 14. Oral thrush   Diflucan ordered 10/20  Nystatin swish and swallow  LOS (Days) 2  A FACE TO FACE EVALUATION WAS PERFORMED  Ankit Karis Juba 04/28/2017 7:14 AM

## 2017-04-28 NOTE — Plan of Care (Signed)
Problem: RH KNOWLEDGE DEFICIT GENERAL Goal: RH STG INCREASE KNOWLEDGE OF SELF CARE AFTER HOSPITALIZATION Pt and family will demonstrate increased knowledge of self care at d/c with cues/resources with Mod I assistance  Outcome: Not Progressing Pt with no evidence of learning at this time. Daughter and Husband demonstrating increased knowledge

## 2017-04-28 NOTE — Progress Notes (Signed)
Occupational Therapy Session Note  Patient Details  Name: [name] MRN: [id number] Date of Birth: 1941-01-20  Today's Date: 04/28/2017 OT Individual Time: 1450-1535 OT Individual Time Calculation (min): 45 min    Short Term Goals: Week 1:  OT Short Term Goal 1 (Week 1): Pt will sit to stand with MIN A in prep for clothing management. OT Short Term Goal 2 (Week 1): Pt will stand pivot transfer to toilet wiht MIN A OT Short Term Goal 3 (Week 1): Pt will stand to groom 2/2 grooming items to improve functional endurance with touching A for balance. OT Short Term Goal 4 (Week 1): Pt will thread BLE into pants with supervision and no more than 1 Vc for clothing orientation  Skilled Therapeutic Interventions/Progress Updates:    1;1. Pt completes stand pivot transfer recliner<>w/c with MOD A for lifitng and Vc for weight shifting anteriorly in standing. Pt benefits from cues to "slow dance" to shuffle feet for pivot. Pt sit to stand with MOD A in tx gym from w/c, however pt report nausea and dizziness. BP assessed 122//58. RN alerted to BP and pt provided with water. Pt reports water helping dizziness. Pt participates in activities at [facility] horse shoes and ball toss for sitting balance, selective attention and BUE coordination. Pt easily distracted by external stimuli requiring MOD VC to maintain attention to task. Pt sorts silverware with 2 VC to fix mistakes. Exited session with pt seated in recliner with call light in reach, family present in room and all needs met.   Therapy Documentation Precautions:  Precautions Precautions: Fall, Sternal Restrictions Weight Bearing Restrictions: No Other Position/Activity Restrictions: sternal precautions  See Function Navigator for Current Functional Status.   Therapy/Group: Individual Therapy  Tonny Branch 04/28/2017, 5:28 PM

## 2017-04-29 ENCOUNTER — Inpatient Hospital Stay (HOSPITAL_COMMUNITY): Payer: Medicare Other | Admitting: Occupational Therapy

## 2017-04-29 ENCOUNTER — Inpatient Hospital Stay (HOSPITAL_COMMUNITY): Payer: Medicare Other | Admitting: Physical Therapy

## 2017-04-29 ENCOUNTER — Inpatient Hospital Stay (HOSPITAL_COMMUNITY): Payer: Medicare Other

## 2017-04-29 DIAGNOSIS — E876 Hypokalemia: Secondary | ICD-10-CM

## 2017-04-29 LAB — CBC WITH DIFFERENTIAL/PLATELET
BASOS PCT: 0 %
Basophils Absolute: 0 10*3/uL (ref 0.0–0.1)
EOS ABS: 0.4 10*3/uL (ref 0.0–0.7)
Eosinophils Relative: 3 %
HEMATOCRIT: 35.6 % — AB (ref 36.0–46.0)
HEMOGLOBIN: 11.8 g/dL — AB (ref 12.0–15.0)
Lymphocytes Relative: 11 %
Lymphs Abs: 1.4 10*3/uL (ref 0.7–4.0)
MCH: 30.4 pg (ref 26.0–34.0)
MCHC: 33.1 g/dL (ref 30.0–36.0)
MCV: 91.8 fL (ref 78.0–100.0)
Monocytes Absolute: 1 10*3/uL (ref 0.1–1.0)
Monocytes Relative: 8 %
NEUTROS ABS: 10.3 10*3/uL — AB (ref 1.7–7.7)
NEUTROS PCT: 78 %
Platelets: 192 10*3/uL (ref 150–400)
RBC: 3.88 MIL/uL (ref 3.87–5.11)
RDW: 16.6 % — ABNORMAL HIGH (ref 11.5–15.5)
WBC: 13.1 10*3/uL — AB (ref 4.0–10.5)

## 2017-04-29 LAB — COMPREHENSIVE METABOLIC PANEL
ALBUMIN: 2.2 g/dL — AB (ref 3.5–5.0)
ALK PHOS: 136 U/L — AB (ref 38–126)
ALT: 60 U/L — AB (ref 14–54)
ANION GAP: 6 (ref 5–15)
AST: 35 U/L (ref 15–41)
BILIRUBIN TOTAL: 1.8 mg/dL — AB (ref 0.3–1.2)
BUN: 35 mg/dL — AB (ref 6–20)
CALCIUM: 8.1 mg/dL — AB (ref 8.9–10.3)
CO2: 25 mmol/L (ref 22–32)
Chloride: 104 mmol/L (ref 101–111)
Creatinine, Ser: 1.44 mg/dL — ABNORMAL HIGH (ref 0.44–1.00)
GFR calc Af Amer: 39 mL/min — ABNORMAL LOW (ref >60)
GFR calc non Af Amer: 34 mL/min — ABNORMAL LOW (ref >60)
Glucose, Bld: 111 mg/dL — ABNORMAL HIGH (ref 65–99)
Potassium: 3.3 mmol/L — ABNORMAL LOW (ref 3.5–5.1)
SODIUM: 135 mmol/L (ref 135–145)
TOTAL PROTEIN: 5.4 g/dL — AB (ref 6.5–8.1)

## 2017-04-29 LAB — PROTIME-INR
INR: 2.18
Prothrombin Time: 24.1 seconds — ABNORMAL HIGH (ref 11.4–15.2)

## 2017-04-29 MED ORDER — POTASSIUM CHLORIDE CRYS ER 10 MEQ PO TBCR
10.0000 meq | EXTENDED_RELEASE_TABLET | Freq: Every day | ORAL | Status: DC
  Administered 2017-04-29: 10 meq via ORAL
  Filled 2017-04-29 (×2): qty 1

## 2017-04-29 MED ORDER — WARFARIN SODIUM 2 MG PO TABS
2.0000 mg | ORAL_TABLET | Freq: Every day | ORAL | Status: DC
  Administered 2017-04-29 – 2017-04-30 (×2): 2 mg via ORAL
  Filled 2017-04-29 (×3): qty 1

## 2017-04-29 NOTE — Progress Notes (Signed)
Patient information reviewed and entered into eRehab system by Zavannah Deblois, RN, CRRN, PPS Coordinator.  Information including medical coding and functional independence measure will be reviewed and updated through discharge.     Per nursing patient was given "Data Collection Information Summary for Patients in Inpatient Rehabilitation Facilities with attached "Privacy Act Statement-Health Care Records" upon admission.  

## 2017-04-29 NOTE — Progress Notes (Signed)
Inpatient Rehabilitation Center Individual Statement of Services  Patient Name:  Amanda [name]  Date:  04/29/2017  Welcome to the Inpatient Rehabilitation Center.  Our goal is to provide you with an individualized program based on your diagnosis and situation, designed to meet your specific needs.  With this comprehensive rehabilitation program, you will be expected to participate in at least 3 hours of rehabilitation therapies Monday-Friday, with modified therapy programming on the weekends.  Your rehabilitation program will include the following services:  Physical Therapy (PT), Occupational Therapy (OT), Speech Therapy (ST), 24 hour per day rehabilitation nursing, Therapeutic Recreaction (TR), Case Management (Social Worker), Rehabilitation Medicine, Nutrition Services and Pharmacy Services  Weekly team conferences will be held on Wednesdays to discuss your progress.  Your Social Worker will talk with you frequently to get your input and to update you on team discussions.  Team conferences with you and your family in attendance may also be held.  Expected length of stay:  2 to 3 weeks  Overall anticipated outcome:  Supervision with minimal assistance for shower transfers, stairs, attention/awareness/cognitive tasks  Depending on your progress and recovery, your program may change. Your Social Worker will coordinate services and will keep you informed of any changes. Your Social Worker's name and contact numbers are listed  below.  The following services may also be recommended but are not provided by the Inpatient Rehabilitation Center:   Driving Evaluations  Home Health Rehabiltiation Services  Outpatient Rehabilitation Services   Arrangements will be made to provide these services after discharge if needed.  Arrangements include referral to agencies that provide these services.  Your insurance has been verified to be:  [employer] Your primary doctor is:  Dr. [name]  Pertinent information will be shared with your doctor and your insurance company.  Social Worker:  Staci Acosta, LCSW  [phone] or [phone]  Information discussed with and copy given to patient by: Elvera Lennox, 04/29/2017, 2:10 PM

## 2017-04-29 NOTE — Progress Notes (Signed)
ANTICOAGULATION CONSULT NOTE - Follow Up Consult  Pharmacy Consult for warfarin Indication: atrial fibrillation  Allergies  Allergen Reactions  . Pepcid [Famotidine] Swelling and Other (See Comments)    "Throat Swelling"   . Allopurinol Other (See Comments)    Caused headaches  . Amlodipine Other (See Comments)    PEDAL EDEMA   . Colchicine Other (See Comments)    UNSPECIFIED REACTION   . Contrast Media [Iodinated Diagnostic Agents] Hives    IVP dye per patient  . Prilosec [Omeprazole] Nausea Only  . Atorvastatin Other (See Comments)    UNSPECIFIED REACTION   . Cephalexin Itching and Rash  . Ciprofloxacin Itching and Rash  . Erythromycin Itching and Rash  . Sulfonamide Derivatives Itching and Rash  . Tetracycline Itching and Rash    Patient Measurements: Height: 5' 6.5" (168.9 cm) Weight: 140 lb 9.6 oz (63.8 kg) IBW/kg (Calculated) : 60.45  Vital Signs: Temp: 97.8 F (36.6 C) (10/22 0550) Temp Source: Oral (10/22 0550) BP: 128/45 (10/22 0550) Pulse Rate: 88 (10/22 0550)  Labs:  Recent Labs  04/27/17 0525 04/28/17 0534 04/29/17 0642  HGB  --   --  11.8*  HCT  --   --  35.6*  PLT  --   --  192  LABPROT 19.5* 22.6* 24.1*  INR 1.67 2.01 2.18  CREATININE  --   --  1.44*    Estimated Creatinine Clearance: 30.8 mL/min (A) (by C-G formula based on SCr of 1.44 mg/dL (H)). Assessment: 8 YOF with h/o Afib on warfarin s/p mitral valve repair and clipping of atrial appendage. INR 2.18, therapeutic, hgb stable, pltc 192K, improved.  S/p fluconazole 150mg  x1 10/20   Goal of Therapy:  INR 2-3 Monitor platelets by anticoagulation protocol: Yes   Plan:  -Warfarin 2mg  daily -Monitor daily PT/INR   Bayard Hugger, PharmD, BCPS  Clinical Pharmacist  Pager: [phone]   04/29/2017 1:49 PM

## 2017-04-29 NOTE — Progress Notes (Signed)
[place] PHYSICAL MEDICINE & REHABILITATION     PROGRESS NOTE  Subjective/Complaints:  Eating breakfast this morning, states she had a good night. Had therapy over the weekend.  ROS: Denies CP, nausea, vomiting, diarrhea.  Objective: Vital Signs: Blood pressure (!) 128/45, pulse 88, temperature 97.8 F (36.6 C), temperature source Oral, resp. rate 12, height 5' 6.5" (1.689 m), weight 63.8 kg (140 lb 9.6 oz), SpO2 96 %. Dg Chest 2 View  Result Date: 04/27/2017 CLINICAL DATA:  Sternal pain. Patient had open heart surgery 04/16/2017. EXAM: CHEST  2 VIEW COMPARISON:  04/24/2017 CXR FINDINGS: The patient is slightly rotated on this current exam the demonstrating a small right-sided apical pneumothorax. The small peripheral right-sided pneumothorax is less apparent on this exam. Interval slight increase in right small pleural effusion with trace left pleural effusion. Mild vascular congestion is noted. Left-sided pacemaker apparatus with right atrial and right ventricular leads are stable. Patient is status post atrial appendage clipping and mitral valvular repair. Right-sided PICC line catheter tip is seen at the cavoatrial junction, unchanged in position. Stable degenerative change along the dorsal spine. IMPRESSION: 1. Re- demonstration of small 5-10% apical pneumothorax on the right. 2. Slight interval increase in bilateral pleural effusions. 3. Mild central vascular congestion. 4. Stable cardiomegaly with mitral valvular repair and atrial appendage clipping. Electronically Signed   By: Tollie Eth M.D.   On: 04/27/2017 19:42    Recent Labs  04/29/17 0642  WBC 13.1*  HGB 11.8*  HCT 35.6*  PLT 192    Recent Labs  04/29/17 0642  NA 135  K 3.3*  CL 104  GLUCOSE 111*  BUN 35*  CREATININE 1.44*  CALCIUM 8.1*   CBG (last 3)   Recent Labs  04/26/17 1339 04/26/17 1445 04/26/17 1659  GLUCAP 54* 108* 106*    Wt Readings from Last 3 Encounters:  04/29/17 63.8 kg (140 lb 9.6 oz)   04/26/17 62.9 kg (138 lb 9.6 oz)  04/15/17 64.4 kg (142 lb)    Physical Exam:  BP (!) 128/45 (BP Location: Left Arm)   Pulse 88   Temp 97.8 F (36.6 C) (Oral)   Resp 12   Ht 5' 6.5" (1.689 m)   Wt 63.8 kg (140 lb 9.6 oz)   SpO2 96%   BMI 22.35 kg/m  Constitutional: She appears well-nourished. No distress.  HENT: Normocephalic. Atraumatic. Eyes: EOM are normal. No discharge. Cardiovascular: RRR. No JVD. Respiratory: Unlabored. Clear to auscultation  GI: Bowel sounds are normal. She exhibits no distension.  Musculoskeletal: She exhibits no tenderness. She exhibits no edema.  Skin. Warm and dry. Intact  Neurological: She is alert.  Motor: LUE/LLE: 4+/5 proximal to distal  RUE/RLE: 4/5 proximal to distal (stable).  Psych: Pleasant and cooperative   Assessment/Plan: 1. Functional deficits secondary to debility which require 3+ hours per day of interdisciplinary therapy in a comprehensive inpatient rehab setting. Physiatrist is providing close team supervision and 24 hour management of active medical problems listed below. Physiatrist and rehab team continue to assess barriers to discharge/monitor patient progress toward functional and medical goals.  Function:  Bathing Bathing position   Position: Wheelchair/chair at sink  Bathing parts Body parts bathed by patient: Right arm, Left arm, Chest, Abdomen, Front perineal area, Right upper leg, Left upper leg, Left lower leg Body parts bathed by helper: Back, Buttocks  Bathing assist Assist Level: Touching or steadying assistance(Pt > 75%)      Upper Body Dressing/Undressing Upper body dressing   What  is the patient wearing?: Pull over shirt/dress     Pull over shirt/dress - Perfomed by patient: Thread/unthread right sleeve, Thread/unthread left sleeve, Put head through opening Pull over shirt/dress - Perfomed by helper: Pull shirt over trunk        Upper body assist Assist Level: Touching or steadying assistance(Pt >  75%)      Lower Body Dressing/Undressing Lower body dressing   What is the patient wearing?: Pants, Underwear, Socks, Shoes Underwear - Performed by patient: Thread/unthread right underwear leg, Thread/unthread left underwear leg, Pull underwear up/down   Pants- Performed by patient: Thread/unthread right pants leg Pants- Performed by helper: Thread/unthread left pants leg, Pull pants up/down     Socks - Performed by patient: Don/doff right sock, Don/doff left sock   Shoes - Performed by patient: Don/doff right shoe, Don/doff left shoe            Lower body assist Assist for lower body dressing: Touching or steadying assistance (Pt > 75%)      Toileting Toileting Toileting activity did not occur: No continent bowel/bladder event Toileting steps completed by patient: Adjust clothing after toileting Toileting steps completed by helper: Adjust clothing prior to toileting, Performs perineal hygiene Toileting Assistive Devices: Grab bar or rail  Toileting assist     Transfers Chair/bed transfer   Chair/bed transfer method: Stand pivot Chair/bed transfer assist level: Maximal assist (Pt 25 - 49%/lift and lower) Chair/bed transfer assistive device: Other (Heart shaped pillow)     Locomotion Ambulation     Max distance: 80 Assist level: Moderate assist (Pt 50 - 74%)   Wheelchair          Cognition Comprehension Comprehension assist level: Understands basic 25 - 49% of the time/ requires cueing 50 - 75% of the time  Expression Expression assist level: Expresses basic 50 - 74% of the time/requires cueing 25 - 49% of the time. Needs to repeat parts of sentences.  Social Interaction Social Interaction assist level: Interacts appropriately 25 - 49% of time - Needs frequent redirection.  Problem Solving Problem solving assist level: Solves basic 25 - 49% of the time - needs direction more than half the time to initiate, plan or complete simple activities  Memory Memory assist  level: Recognizes or recalls less than 25% of the time/requires cueing greater than 75% of the time    Medical Problem List and Plan:  1. Debility secondary to Maze procedure for symptomatic mitral regurgitation 04/16/2017 with sternal precautions/multi-medical. CIR PT, OT 2. DVT Prophylaxis/Anticoagulation: Coumadin per pharmacy protocol   INR therapeutic on 10/21 3. Pain Management: Tylenol as needed  4. Mood: Seroquel 50 mg daily at bedtime. Cranial CT reviewed, negative for acute changes  5. Neuropsych: This patient is not fully capable of making decisions on his own behalf.  6. Skin/Wound Care: Routine skin checks  7. Fluids/Electrolytes/Nutrition: Routine I&Os, hypokalemia. We'll supplement 8. Dysphagia. Dysphagia 2 thin liquids. Follow-up speech therapy  9. CKD stage III. Followed in the past by Dr. Aurelio Jeweterding   Stable 10/22 BMP Latest Ref Rng & Units 04/29/2017 04/26/2017 04/25/2017  Glucose 65 - 99 mg/dL 295(A111(H) 213(Y153(H) 865(H121(H)  BUN 6 - 20 mg/dL 84(O35(H) 96(E62(H) 95(M77(H)  Creatinine 0.44 - 1.00 mg/dL 8.41(L1.44(H) 2.44(W1.71(H) 1.02(V1.74(H)  BUN/Creat Ratio 12 - 28 - - -  Sodium 135 - 145 mmol/L 135 149(H) 151(H)  Potassium 3.5 - 5.1 mmol/L 3.3(L) 3.8 3.0(L)  Chloride 101 - 111 mmol/L 104 115(H) 109  CO2 22 - 32 mmol/L 25 28  33(H)  Calcium 8.9 - 10.3 mg/dL 8.1(L) 8.5(L) 9.0   10. Hypertension. Tenormin 50 mg daily   Controlled 10/22 Vitals:   04/28/17 1607 04/29/17 0550  BP: 116/72 (!) 128/45  Pulse: 75 88  Resp: 20 12  Temp: 97.8 F (36.6 C) 97.8 F (36.6 C)  SpO2: 100% 96%   11. Leukocytosis. Improving.   WBC 16.1 on 10/19 , now down to 13K 12. Hypothyroidism. Synthroid  13. SOB: Improved  CXR reviewed, showing small pneumothorax and pleural effusions  Cont to monitor and consider intervention if symptoms get worse, currently clinically improving 14. Oral thrush   Diflucan ordered 10/20  Nystatin swish and swallow  LOS (Days) 3 A FACE TO FACE EVALUATION WAS  PERFORMED  Erick Colace 04/29/2017 9:14 AM

## 2017-04-29 NOTE — Progress Notes (Signed)
Occupational Therapy Session Note  Patient Details  Name: Amanda [name] MRN: [id number] Date of Birth: 1941/06/12  Today's Date: 04/29/2017 OT Individual Time: 2446-2863 OT Individual Time Calculation (min): 72 min    Short Term Goals: Week 1:  OT Short Term Goal 1 (Week 1): Pt will sit to stand with MIN A in prep for clothing management. OT Short Term Goal 2 (Week 1): Pt will stand pivot transfer to toilet wiht MIN A OT Short Term Goal 3 (Week 1): Pt will stand to groom 2/2 grooming items to improve functional endurance with touching A for balance. OT Short Term Goal 4 (Week 1): Pt will thread BLE into pants with supervision and no more than 1 Vc for clothing orientation  Skilled Therapeutic Interventions/Progress Updates:    Pt seen for OT ADL bathing/dressing session. Pt sitting up in recliner upon arrival, agreeable to tx session. She completed squat pivot transfer with min A to w/c. She completed stand pivot transfer using grab bar to tub transfer bench. She bathed with close supervision in sitting position, standing with steadying assist to complete pericare/ buttock hygiene. Pt very impulsive throughout session, standing without awareness  As to whether assist was present or not. She transitioned to toilet for attempt at [facility], completing hygiene in sitting. She dressed seated on toilet, required assist for orientation of underwear and shirt.  Grooming tasks competed from w/c level at sink with cuing assist for proper set-up as pt attempting to use sugar packet she found on sink ledge as toothpaste. In therapy gym, completed cognitive task in standing focusing on functional activity tolerance. She tolerated ~1-2 minutes in standing before requiring seated rest break. Min cuing for cognitive task. Pt returned to room at end of session. Ambulated with hand held assist to recliner. Pt left in recliner with all needs in reach.  Education/ discussion with pt's daughter and husband.  Reviewed OT/PT goals, POC, role of OT/PT, continuum of care, and d/c planning.   Therapy Documentation Precautions:  Precautions Precautions: Fall, Sternal Restrictions Weight Bearing Restrictions: No Other Position/Activity Restrictions: sternal precautions Pain:   No/ denies pain  See Function Navigator for Current Functional Status.   Therapy/Group: Individual Therapy  Lewis, Jevaughn Degollado C 04/29/2017, 7:21 AM

## 2017-04-29 NOTE — Progress Notes (Signed)
Physical Therapy Session Note  Patient Details  Name: Amanda [name] MRN: [id number] Date of Birth: 05/20/1941  Today's Date: 04/29/2017 PT Individual Time: 0801-0830 PT Individual Time Calculation (min): 29 min   Short Term Goals: Week 1:  PT Short Term Goal 1 (Week 1): Pt will recall 1/3 sternal precautions with mod cues PT Short Term Goal 2 (Week 1): Pt will transfer with mod assist consistently from a variety of surfaces PT Short Term Goal 3 (Week 1): Pt will ambulate 100' with LRAD and min assist PT Short Term Goal 4 (Week 1): Pt will initiate stair negotiation with PT  Skilled Therapeutic Interventions/Progress Updates:    Pt supine in bed asleep upon PT arrival, easily aroused and agreeable to therapy tx, denies pain. Pt transferred from supine to sitting EOB with mod assist in order to maintain sternal precautions. Pt performed sit<>stand with mod assist to maintain sternal precautions. Pt ambulated to the sink and stood in order to brush teeth and wash face without UE support working on dynamic balance. Pt ambulated x 120 ft with RW and min assist working on endurance and activity tolerance. Pt left seated in w/c at end of session with needs in reach.   Therapy Documentation Precautions:  Precautions Precautions: Fall, Sternal Restrictions Weight Bearing Restrictions: No Other Position/Activity Restrictions: sternal precautions   See Function Navigator for Current Functional Status.   Therapy/Group: Individual Therapy  Cresenciano Genre, PT, DPT 04/29/2017, 7:57 AM

## 2017-04-29 NOTE — Progress Notes (Signed)
Retta Diones, RN Rehab Admission Coordinator Signed Physical Medicine and Rehabilitation  PMR Pre-admission Date of Service: 04/26/2017 3:42 PM  Related encounter: Admission (Discharged) from 04/16/2017 in [facility]       []Hide copied text PMR Admission Coordinator Pre-Admission Assessment  Patient: Amanda [name] is an 76 y.o., female MRN: [id number] DOB: 1941/05/05 Height: 5' 6.5" (168.9 cm) Weight: 62.9 kg (138 lb 9.6 oz)                                                                                                                                              Insurance Information HMO: Yes     PPO:       PCP:       IPA:       80/20:       OTHER:   PRIMARY:  [employer] medicare      Policy#: [id number]      Subscriber:  [place] Name:  Vevelyn Royals      Phone#: [phone]     Fax#: [phone] Pre-Cert#:  [id number]      Employer:  Retired Benefits:  Phone #: [phone]     Name:  On line Eff. Date: 07/09/16     Deduct:  $0      Out of Pocket Max: $4400 (met $1494.02      Life Max:  N/A CIR: $345 days 1-5      SNF: $0 days 1-20; $160 days 21-48; $0 days 49-100 Outpatient: medical necessity     Co-Pay: $40/visit Home Health: 100%      Co-Pay: none DME: 80%     Co-Pay: 20% Providers: in network  Emergency Contact Information        Contact Information    Name Relation Home Work Mobile   Polasek,Charles Spouse [phone]     Taney,Cathy Daughter [phone]  [phone]     Current Medical History  Patient Admitting Diagnosis: Debility  History of Present Illness: A 76 y.o.right handed femalewith history of mitral valve prolapse and mitral regurgitation,CVA,hypertension, stage III CKD, heart block with pacemaker, PAF on long-term anticoagulation/Eliquis. Patient lives with spouse. Reportedly independent and driving prior to admission. Presented 04/15/2017 for evaluation and treatment of symptomatic mitral regurgitation.  Recent echocardiogram with normal left ventricular systolic function mild to moderate mitral regurgitation. Follow-up echo showed significant worsening in severity of mitral regurgitation. Underwent Maze procedure 04/16/2017 per Dr. Roxy Manns.Postoperatively patient with 150-211m of chest tube output per hour and underwent reexploration of right mini thoracotomy for any bleeding same date 04/16/2017 with no findings of reaccumulated blood with ongoing bleedingHospital course pain management. Sternal precautions as directed.  Hospital course leukocytosis 21,600 and monitoredwith latest of 17,200 .Intermittent bouts of confusion and restlessness. Cranial CT scan reviewed, unremarkable for acute process.Subcutaneous Lovenox for DVT prophylaxisand transitioned to Coumadin.Initially maintained on TPN for nutritional support and  diet advanced to a dysphagia #2 thin liquid dietPhysicaland occupationaltherapy evaluation completed with recommendations of physical medicine rehabilitation consult.Patient to be admitted for a comprehensive inpatient rehabilitation program.   Past Medical History      Past Medical History:  Diagnosis Date  . Allergic rhinitis   . Anemia   . CKD (chronic kidney disease), stage III ([place])    stage III  . DJD (degenerative joint disease)   . Glomerulonephritis    Dr Jimmy Footman  . Hyperlipidemia   . Hyperparathyroidism ([place])   . Hypertension   . Hypothyroidism   . IBS (irritable bowel syndrome)   . Interstitial cystitis   . Mitral regurgitation   . Mobitz II    a. s/p STJ dual chamber PPM   . MVP (mitral valve prolapse)    moderate posterior MVP with moderate MR and grade II diasotlic dysfunction  . PAC (premature atrial contraction)   . PAF (paroxysmal atrial fibrillation) ([place])     note on pacer check. CHADS2VASC score is 4 now on Eliquis.  . Paroxysmal atrial fibrillation ([facility])    CHADS2VASC score is 4  . PVC's (premature ventricular  contractions)   . S/P minimally invasive maze operation for atrial fibrillation 04/16/2017   Complete bilateral atrial lesion set using cryothermy and bipolar radiofrequency ablation with clipping of LA appendage via right mini thoracotomy approach  . S/P minimally invasive mitral valve repair 04/16/2017   Complex valvuloplasty including triangular resection of posterior leaflet, artificial Gore-tex neochords x6 and Sorin Memo 3D ring annuloplasty ([id number], size 32, serial # [id number])  . S/P placement of cardiac pacemaker   . Small vessel disease, cerebrovascular     Family History  family history includes Brain cancer in her brother; Heart attack in her brother and father; Heart disease in her brother, father, and mother; Hypertension in her brother, mother, and sister; Melanoma in her brother; Ovarian cancer in her sister; Rheum arthritis in her sister; Stroke in her mother.  Prior Rehab/Hospitalizations: No rehab admissions.  Has the patient had major surgery during 100 days prior to admission? No  Current Medications   Current Facility-Administered Medications:  .  atenolol (TENORMIN) tablet 50 mg, 50 mg, Oral, Daily, [name], MD, 50 mg at 04/26/17 0928 .  [DISCONTINUED] bisacodyl (DULCOLAX) EC tablet 10 mg, 10 mg, Oral, Daily **OR** bisacodyl (DULCOLAX) suppository 10 mg, 10 mg, Rectal, Daily, [name], MD, 10 mg at 04/26/17 0931 .  chlorhexidine (PERIDEX) 0.12 % solution 15 mL, 15 mL, Mouth Rinse, BID, [name], MD, 15 mL at 04/26/17 0928 .  Chlorhexidine Gluconate Cloth 2 % PADS 6 each, 6 each, Topical, Daily, [name], MD, 6 each at 04/26/17 [phone] .  dextrose 5% lactated ringers 1,000 mL with potassium chloride 40 mEq/L Pediatric IV infusion, , Intravenous, Continuous, [name], MD, Last Rate: 50 mL/hr at 04/26/17 0900 .  feeding supplement (ENSURE ENLIVE) (ENSURE ENLIVE) liquid 237 mL, 237 mL, Oral, BID BM, [name], MD, 237 mL at  04/26/17 1400 .  haloperidol lactate (HALDOL) injection 1-4 mg, 1-4 mg, Intravenous, Q3H PRN, Minor, Grace Bushy, NP, 1 mg at 04/19/17 1425 .  hydrALAZINE (APRESOLINE) injection 10 mg, 10 mg, Intravenous, Q6H PRN, Melrose Nakayama, MD .  labetalol (NORMODYNE,TRANDATE) injection 10 mg, 10 mg, Intravenous, Q2H PRN, [name], MD, 10 mg at 04/20/17 0654 .  levothyroxine (SYNTHROID, LEVOTHROID) tablet 75 mcg, 75 mcg, Oral, Q0600, [name], MD, 75 mcg at 04/26/17  0600 .  MEDLINE mouth rinse, 15 mL, Mouth Rinse, q12n4p, [name], MD, 15 mL at 04/26/17 1200 .  metoprolol tartrate (LOPRESSOR) injection 2.5-5 mg, 2.5-5 mg, Intravenous, Q2H PRN, [name], MD, 5 mg at 04/20/17 0355 .  ondansetron (ZOFRAN) injection 4 mg, 4 mg, Intravenous, Q6H PRN, [name], MD .  pantoprazole (PROTONIX) EC tablet 40 mg, 40 mg, Oral, Daily, [name], MD, 40 mg at 04/26/17 0928 .  QUEtiapine (SEROQUEL) tablet 50 mg, 50 mg, Oral, QHS, [name], MD, 50 mg at 04/25/17 2000 .  RESOURCE THICKENUP CLEAR, , Oral, PRN, Melrose Nakayama, MD .  sodium chloride flush (NS) 0.9 % injection 10-40 mL, 10-40 mL, Intracatheter, Q12H, [name], MD, 10 mL at 04/26/17 1000 .  warfarin (COUMADIN) tablet 1 mg, 1 mg, Oral, q1800, [name], MD, 1 mg at 04/25/17 1752 .  Warfarin - Physician Dosing Inpatient, , Does not apply, q1800, Blenda Nicely, [facility], 1 each at 04/24/17 1800  Patients Current Diet: DIET DYS 2 Room service appropriate? Yes; Fluid consistency: Thin  Precautions / Restrictions Precautions Precautions: Fall Precaution Comments: chest tube in R side Restrictions Weight Bearing Restrictions: No Other Position/Activity Restrictions: sternal precautions   Has the patient had 2 or more falls or a fall with injury in the past year?No  Prior Activity Level Community (5-7x/wk): Went out 5 X a week, was driving.  Home Assistive Devices /  Equipment None  Prior Device Use: Indicate devices/aids used by the patient prior to current illness, exacerbation or injury? None  Prior Functional Level Prior Function Level of Independence: Independent Comments: driving, dressing, bathing, walking without AD  Self Care: Did the patient need help bathing, dressing, using the toilet or eating?  Independent  Indoor Mobility: Did the patient need assistance with walking from room to room (with or without device)? Independent  Stairs: Did the patient need assistance with internal or external stairs (with or without device)? Independent  Functional Cognition: Did the patient need help planning regular tasks such as shopping or remembering to take medications? Independent  Current Functional Level Cognition  Overall Cognitive Status: Impaired/Different from baseline Current Attention Level: Focused Orientation Level: Oriented to person, Oriented to place, Disoriented to time, Disoriented to situation Following Commands: Follows one step commands inconsistently, Follows one step commands with increased time Safety/Judgement: Decreased awareness of safety General Comments: pt with lethargy/fatigue and had difficulty follow commands     Extremity Assessment (includes Sensation/Coordination)  Upper Extremity Assessment: Generalized weakness  Lower Extremity Assessment: Defer to PT evaluation    ADLs  Overall ADL's : Needs assistance/impaired Eating/Feeding: Total assistance, Sitting Grooming: Wash/dry hands, Wash/dry face, Oral care, Brushing hair, Total assistance, Sitting Upper Body Bathing: Total assistance, Sitting Lower Body Bathing: Total assistance, Sit to/from stand Upper Body Dressing : Total assistance, Sitting Lower Body Dressing: Total assistance, Sit to/from stand Toilet Transfer: Moderate assistance, +2 for physical assistance, RW, BSC, Grab bars, Comfort height toilet, Ambulation Toileting- Clothing  Manipulation and Hygiene: Total assistance, Sit to/from stand Functional mobility during ADLs: Moderate assistance, +2 for physical assistance, Rolling walker (EVA walker ) General ADL Comments: Pt requires assist due to severity of cognitive deficits and currently with focused attention.  She is able to engage briefly in ADL tasks, but becomes distracted and unable to complete     Mobility  Overal bed mobility: Needs Assistance Bed Mobility: Sit to Supine Sit to supine: Min assist General bed mobility comments:  Pt sitting up in the chair.  On arrival, pt slid down in chair.  Had to use max assist to get her positioned well.  Of note, took geomat cushion out at end of treaztment as feel that it was causing pt to slide out.     Transfers  Overall transfer level: Needs assistance Equipment used:  (EVA walker ) Transfers: Sit to/from Stand Sit to Stand: Mod assist, +2 physical assistance, Max assist Stand pivot transfers: Mod assist, +2 physical assistance General transfer comment: assist to move into standing, assist for balance and assist for walker safety.  constant cues and support to maintain standing.     Ambulation / Gait / Stairs / Wheelchair Mobility  Ambulation/Gait Ambulation/Gait assistance: +2 physical assistance, Mod assist, Max assist (3rd person for chair follow) Ambulation Distance (Feet): 5 Feet Assistive device:  (EVa walker) Gait Pattern/deviations: Step-through pattern, Decreased stride length, Trunk flexed, Wide base of support, Drifts right/left, Decreased step length - right, Decreased step length - left General Gait Details: Pt was able to use Eva walker with constant cues to stay close toEva walker as well as cue for upright posture.  Pt needed contant cues to sequence steps and Harmon Pier walker. However today even with encouragement, pt would not ambulate over 5 feet.  Pt was confused and feel that this limited her.  3rd person for chair folllow.  Gait velocity  interpretation: Below normal speed for age/gender    Posture / Balance Dynamic Sitting Balance Sitting balance - Comments: Pt requires min guard to min A  Balance Overall balance assessment: Needs assistance Sitting-balance support: Feet supported Sitting balance-Leahy Scale: Poor Sitting balance - Comments: Pt requires min guard to min A  Postural control: Posterior lean Standing balance support: Bilateral upper extremity supported Standing balance-Leahy Scale: Poor Standing balance comment: Pt reliant on bil. UE support and mod A     Special needs/care consideration BiPAP/CPAP No CPM No Continuous Drip IV No Dialysis No      Life Vest No Oxygen No Special Bed No Trach Size No Wound Vac (area) No     Skin No                             Bowel mgmt: Last BM 04/25/17 Bladder mgmt: Voiding up on BSC Diabetic mgmt No    Previous Home Environment Additional Comments: pt was living at home with spouse in 1 story home with 3 STE, walk in shower with seat in it  Discharge Living Setting Plans for Discharge Living Setting: Patient's home, House, Lives with (comment) (Lives with husband.  Daughter is 5 mins away.) Type of Home at Discharge: House Discharge Home Layout: One level Discharge Home Access: Stairs to enter Entrance Stairs-Number of Steps: 2  Social/Family/Support Systems Patient Roles: Spouse, Parent (Has a husband and a daughter.) Contact Information: Kyrah Schiro - spouse - [phone] Anticipated Caregiver: Husband and daughter Anticipated Caregiver's Contact Information: Adonis Housekeeper - daughter - [phone] Ability/Limitations of Caregiver: Husband retired and can assist.  Daughter can work from home most days of the week. Caregiver Availability: 24/7 Discharge Plan Discussed with Primary Caregiver: Yes Is Caregiver In Agreement with Plan?: Yes Does Caregiver/Family have Issues with Lodging/Transportation while Pt is in Rehab?: No  Goals/Additional  Needs Patient/Family Goal for Rehab: PT/OT supervision, SLP mod I and supervision goals Expected length of stay: 6-10 days Cultural Considerations: [facility] Dietary Needs: Dys 2, thin liquids Equipment Needs: TBD Pt/Family Agrees  to Admission and willing to participate: Yes Program Orientation Provided & Reviewed with Pt/Caregiver Including Roles  & Responsibilities: Yes  Decrease burden of Care through IP rehab admission: N/A  Possible need for SNF placement upon discharge: Not planned  Patient Condition: This patient's medical and functional status has changed since the consult dated: 04/24/17 in which the Rehabilitation Physician determined and documented that the patient's condition is appropriate for intensive rehabilitative care in an inpatient rehabilitation facility. See "History of Present Illness" (above) for medical update. Functional changes are:  Currently requiring mod/max assist to ambulate 5 feet EVa walker. Patient's medical and functional status update has been discussed with the Rehabilitation physician and patient remains appropriate for inpatient rehabilitation. Will admit to inpatient rehab today.  Preadmission Screen Completed By:  Retta Diones, 04/26/2017 3:54 PM ______________________________________________________________________   Discussed status with Dr.  Naaman Plummer on 04/26/17 at 1553 and received telephone approval for admission today.  Admission Coordinator:  Retta Diones, time 1553/Date 04/26/17       Cosigned by: Meredith Staggers, MD at 04/26/2017 4:03 PM  Revision History

## 2017-04-29 NOTE — Progress Notes (Signed)
Social Work Assessment and Plan  Patient Details  Name: Amanda [name] MRN: [id number] Date of Birth: [birth year]  Today's Date: 04/29/2017  Problem List:  Patient Active Problem List   Diagnosis Date Noted  . Chronic anticoagulation   . Benign essential HTN   . Oral thrush   . Subtherapeutic anticoagulation   . Sternal pain   . SOB (shortness of breath)   . Stage 3 chronic kidney disease ([place])   . Debility 04/26/2017  . History of CVA (cerebrovascular accident)   . S/P placement of cardiac pacemaker   . Post-operative pain   . Labile blood glucose   . Hypernatremia   . S/P minimally invasive mitral valve repair + maze procedure 04/16/2017  . S/P minimally invasive maze operation for atrial fibrillation 04/16/2017  . DOE (dyspnea on exertion) 03/05/2017  . Paroxysmal atrial fibrillation (HCC)   . Acute delirium 04/28/2015  . Acute UTI 04/01/2015  . Acute encephalopathy 04/01/2015  . AKI (acute kidney injury) ([place]) 04/01/2015  . Leukocytosis 04/01/2015  . Thrombocytopenia ([place]) 04/01/2015  . Hyponatremia 04/01/2015  . Hypothyroidism 04/01/2015  . CKD (chronic kidney disease), stage III ([place]) 04/01/2015  . UTI (lower urinary tract infection)   . Severe sepsis without septic shock ([place])   . Pacemaker 02/03/2015  . AV block 01/06/2015  . RBBB 05/21/2014  . Mitral regurgitation 11/26/2013  . PAC (premature atrial contraction)   . MVP (mitral valve prolapse) 11/11/2013  . DIARRHEA 07/24/2010  . GLOMERULONEPHRITIS 03/27/2010  . COLITIS 10/06/2009  . Essential hypertension 10/05/2009  . DYSPHAGIA UNSPECIFIED 10/05/2009  . Personal history of other endocrine, metabolic, and immunity disorders 10/05/2009   Past Medical History:  Past Medical History:  Diagnosis Date  . Allergic rhinitis   . Anemia   . CKD (chronic kidney disease), stage III ([place])    stage III  . DJD (degenerative joint disease)   . Glomerulonephritis    Dr Jimmy Footman  . Hyperlipidemia   .  Hyperparathyroidism ([place])   . Hypertension   . Hypothyroidism   . IBS (irritable bowel syndrome)   . Interstitial cystitis   . Mitral regurgitation   . Mobitz II    a. s/p STJ dual chamber PPM   . MVP (mitral valve prolapse)    moderate posterior MVP with moderate MR and grade II diasotlic dysfunction  . PAC (premature atrial contraction)   . PAF (paroxysmal atrial fibrillation) ([place])     note on pacer check. CHADS2VASC score is 4 now on Eliquis.  . Paroxysmal atrial fibrillation (HCC)    CHADS2VASC score is 4  . PVC's (premature ventricular contractions)   . S/P minimally invasive maze operation for atrial fibrillation 04/16/2017   Complete bilateral atrial lesion set using cryothermy and bipolar radiofrequency ablation with clipping of LA appendage via right mini thoracotomy approach  . S/P minimally invasive mitral valve repair 04/16/2017   Complex valvuloplasty including triangular resection of posterior leaflet, artificial Gore-tex neochords x6 and Sorin Memo 3D ring annuloplasty ([id number], size 32, serial # [id number])  . S/P placement of cardiac pacemaker   . Small vessel disease, cerebrovascular    Past Surgical History:  Past Surgical History:  Procedure Laterality Date  . ABDOMINAL HYSTERECTOMY    . BREAST BIOPSY Right   . CLIPPING OF ATRIAL APPENDAGE  04/16/2017   Procedure: CLIPPING OF ATRIAL APPENDAGE using AtriCure Pro2 clip 45;  Surgeon: [name], MD;  Location: [facility];  Service: Open Heart Surgery;;  . CYSTOSTOMY W/ BLADDER  BIOPSY    . EP IMPLANTABLE DEVICE N/A 01/07/2015   STJ dual chamber pacemaker implanted by Dr Lovena Le for 2:1 heart block  . MINIMALLY INVASIVE MAZE PROCEDURE N/A 04/16/2017   Procedure: MINIMALLY INVASIVE MAZE PROCEDURE;  Surgeon: [name], MD;  Location: [facility];  Service: Open Heart Surgery;  Laterality: N/A;  . MITRAL VALVE REPAIR Right 04/16/2017   Procedure: MINIMALLY INVASIVE MITRAL VALVE REPAIR (MVR);  Surgeon: [name], MD;   Location: [facility];  Service: Open Heart Surgery;  Laterality: Right;  . MITRAL VALVE REPAIR Right 04/16/2017   Procedure: RE-EXPLORATION RIGHT THORACOTOMY FOR BLEEDING;  Surgeon: [name], MD;  Location: [facility];  Service: Open Heart Surgery;  Laterality: Right;  . PERIPHERAL VASCULAR CATHETERIZATION N/A 02/03/2015   Procedure: Upper Extremity Venography;  Surgeon: Deboraha Sprang, MD;  Location: [facility];  Service: Cardiovascular;  Laterality: N/A;  . RIGHT/LEFT HEART CATH AND CORONARY ANGIOGRAPHY N/A 03/26/2017   Procedure: RIGHT/LEFT HEART CATH AND CORONARY ANGIOGRAPHY;  Surgeon: [name], MD;  Location: [facility];  Service: Cardiovascular;  Laterality: N/A;  . TEE WITHOUT CARDIOVERSION N/A 03/18/2017   Procedure: TRANSESOPHAGEAL ECHOCARDIOGRAM (TEE);  Surgeon: Skeet Latch, MD;  Location: [facility];  Service: Cardiovascular;  Laterality: N/A;  . TEE WITHOUT CARDIOVERSION N/A 04/16/2017   Procedure: TRANSESOPHAGEAL ECHOCARDIOGRAM (TEE);  Surgeon: [name], MD;  Location: [facility];  Service: Open Heart Surgery;  Laterality: N/A;  . TONSILLECTOMY     Social History:  reports that she has quit smoking. She has never used smokeless tobacco. She reports that she does not drink alcohol or use drugs.  Family / Support Systems Marital Status: Married How Long?: 50 years Patient Roles: Spouse, Parent (two dtrs; one local and one in [place]) Spouse/Significant Other: Charles "Charlie" Motl - husband - [phone] Children: Adonis Housekeeper - dtr - [phone]; dtr in [place] Other Supports: Church family Anticipated Caregiver: Husband and daughter Ability/Limitations of Caregiver: Husband retired and can assist.  Daughter can work from home most days of the week. Caregiver Availability: 24/7 Family Dynamics: Close, supportive family who will be the patient at discharge.  Social History Preferred language: English Religion: Methodist Read: Yes Write:  Yes Employment Status: Retired Public relations account executive Issues: none reported Guardian/Conservator: Pt is not fully capable of making his own decisions at this time.  Husband is pt's next of kin for decision making purposes.   Abuse/Neglect Physical Abuse: Denies Verbal Abuse: Denies Sexual Abuse: Denies Exploitation of patient/patient's resources: Denies Self-Neglect: Denies  Emotional Status Pt's affect, behavior and adjustment status: Pt was initially sleeping during CSW's visit, but then woke up and was able to engage in conversation.   Recent Psychosocial Issues: Pt reports feeling well emotionally and CSW will continue to monitor this. Psychiatric History: none reported Substance Abuse History: none reported  Patient / Family Perceptions, Expectations & Goals Pt/Family understanding of illness & functional limitations: Pt/family report a good understanding of pt's condition and her limitations.  They do not have any unanswered questions at this time. Premorbid pt/family roles/activities: Pt enjoys gardening, painting Psychologist, occupational), and singing in the church choir. Anticipated changes in roles/activities/participation: Pt would like to get back to these things. Pt/family expectations/goals: Pt/family would like for pt to regain strength and be able to come home.  Community Resources [employer]: None Premorbid Home Care/DME Agencies: None Transportation available at discharge: family  Discharge Planning Living Arrangements: Spouse/significant other Support Systems: Spouse/significant  other, Children, Social worker community ([place]) Type of Residence: Private residence [employer] Resources: [employer] (Marine scientist) Museum/gallery curator Resources: Fish farm manager, Family Support Financial Screen Referred: No Money Management: Spouse, Patient Does the patient have any problems obtaining your medications?: No Home  Management: Pt and husband were sharing these responsibilities. Patient/Family Preliminary Plans: Pt's family would like to bring pt home if she progresses to a level where she can be safely managed at home.  They are open to pt transferring to a SNF if she needs more rehab, but clearly want/hope for her to come home after CIR. Social Work Anticipated Follow Up Needs: HH/OP Expected length of stay: 2 to 3 weeks  Clinical Impression CSW met with pt, pt's husband, and pt's dtr to introduce self and role of CSW, as well as to complete assessment.  Pt was asleep initially during visit, but then she awoke and was engaged in conversation, but admitted to being tired.  CSW and pt's husband and dtr talked about d/c planning.  They are willing to do whatever is best for the pt at d/c.  Dtr and husband can provide supervision at home, if pt can progress to that level.  If pt needs more rehab, they are open to SNF transfer.  CSW explained that therapists would recommend f/u therapies ([place] vs. Outpt) and CSW will assist with arranging that and any recommended DME.  They expressed understanding and appreciation and are pleased pt is on CIR.  CSW will continue to follow and assist as needed.  Jomaira Darr, Silvestre Mesi 04/29/2017, 2:48 PM

## 2017-04-29 NOTE — Progress Notes (Signed)
Speech Language Pathology Daily Session Note  Patient Details  Name: Amanda [name] MRN: [id number] Date of Birth: 1941-06-14  Today's Date: 04/29/2017 SLP Individual Time: 0762-2633 SLP Individual Time Calculation (min): 11 min  Short Term Goals: Week 1: SLP Short Term Goal 1 (Week 1): Pt will consume dys 2 textures and thin liquids with min verbal cues to clear solids from the oral cavity.   SLP Short Term Goal 2 (Week 1): Pt will sustain her attention to basic tasks for 5 minutes with mod verbal cues for redirection.   SLP Short Term Goal 3 (Week 1): Pt will utilize external aids to orient to place, date, and situation with mod assist verbal cues.  SLP Short Term Goal 4 (Week 1): Pt will complete basic, familiar tasks with mod verbal cues for functional problem solving.   SLP Short Term Goal 5 (Week 1): Pt will identify at least 2 deficits occurring s/p hospitalization with mod question cues.   SLP Short Term Goal 6 (Week 1): Pt will return demonstration of at least 2 safety precautions during functional tasks with mod verbal cues.  Skilled Therapeutic Interventions: Skilled treatment session focused on completing education with pt's husband and daughter on aspiration precautions in order to perform supervision during meals. Education completed and all questions answered to their satisfaction. They are now able to safety provide supervision during PO intake.         Pain    Therapy/Group: Individual Therapy  Caroline Longie 04/29/2017, 3:50 PM

## 2017-04-29 NOTE — Progress Notes (Signed)
Physical Therapy Session Note  Patient Details  Name: Amanda [name] MRN: [id number] Date of Birth: 1940/08/28  Today's Date: 04/29/2017 PT Individual Time: 1000-1100 and 3968-8648 PT Individual Time Calculation (min): 60 min and30 min   Short Term Goals: Week 1:  PT Short Term Goal 1 (Week 1): Pt will recall 1/3 sternal precautions with mod cues PT Short Term Goal 2 (Week 1): Pt will transfer with mod assist consistently from a variety of surfaces PT Short Term Goal 3 (Week 1): Pt will ambulate 100' with LRAD and min assist PT Short Term Goal 4 (Week 1): Pt will initiate stair negotiation with PT  Skilled Therapeutic Interventions/Progress Updates: Tx1: Pt presented in w/c with family present agreeable to therapy. PTA discussed with pt and family progression of therapy and current deficits that may be worked on.  Pt transported to ortho gym for energy conservation and performed car transfer with min/modA. Initiated stair training x 6 steps with x1 rail and min/modA in step to pattern. Ambulated on compliant surface x 10 ft with HHA. Pt performed sit to/from stand x 5 with decreased use of UE. Pt required frequent rest breaks throughout session due to fatigue and pt c/o increased soreness L chest which decreased with rest. BP 110/66, HR 90, SpO2 89% with cues for PLB increased quickly to 97%. Pt indicated urgency for toilet, performed stand pivot to w/c and pt transported to room. Performed stand pivot minA to toilet with PTA providing modA for LB clothing management due to urgency (+void). Pt able to maintain fair standing balance while PTA donned brief after toileting and remained in w/c at end of session with call bell within reach and family [present.   Tx2: Pt presented in recliner sleeping but easily awakened,  nsg arrived to provide meds. Pt ambulated HHA 134f with narrow BOS and shortened steps with pt intermittently reaching for wall rail. Pt able to improve steps with cues and  improve cadence during ambulation. Pt transported and performed NuStep L1 x 5 min with cues to increase SPM to 30 which pt was able to reach but unable to maintain. Pt ambulated from NuStep back to room with w/c follow and HHA with cues for erect posture and increased step length. Pt returned to recliner at end of session with needs met and family present.      Therapy Documentation Precautions:  Precautions Precautions: Fall, Sternal Restrictions Weight Bearing Restrictions: No Other Position/Activity Restrictions: sternal precautions  See Function Navigator for Current Functional Status.   Therapy/Group: Individual Therapy  Isadore Palecek  Ginnifer Creelman, PTA  04/29/2017, 4:03 PM

## 2017-04-29 NOTE — IPOC Note (Signed)
Overall Plan of Care [facility]) Patient Details Name: Amanda [name] MRN: [id number] DOB: 12-Nov-1940  Admitting Diagnosis: <principal problem not specified>  Hospital Problems: Active Problems:   Debility   Subtherapeutic anticoagulation   Sternal pain   SOB (shortness of breath)   Stage 3 chronic kidney disease (HCC)   Chronic anticoagulation   Benign essential HTN   Oral thrush     Functional Problem List: Nursing Behavior, Bladder, Pain, Nutrition, Endurance, Skin Integrity, Medication Management, Safety  PT Balance, Endurance, Motor, Safety  OT Balance, Cognition, Endurance, Nutrition, Perception, Safety  SLP Cognition, Nutrition  TR         Basic ADL's: OT Grooming, Bathing, Dressing, Toileting     Advanced  ADL's: OT       Transfers: PT Bed Mobility, Bed to Chair, Car, Occupational psychologist, Research scientist (life sciences): PT Stairs, Ambulation, Psychologist, prison and probation services     Additional Impairments: OT    SLP Swallowing, Social Cognition   Problem Solving, Memory, Attention, Awareness  TR      Anticipated Outcomes Item Anticipated Outcome  Self Feeding MOD I  Swallowing  supervision    Basic self-care  S  Toileting  S   Bathroom Transfers S  Bowel/Bladder  Manage B&B with Mod I assistance  Transfers  supervision  Locomotion  supervision with LRAD  Communication     Cognition  min assist   Pain  Pain </=2 on scale of 0/10  Safety/Judgment  Maintain safety with Mod I assistance and devices as appropriate   Therapy Plan: PT Intensity: Minimum of 1-2 x/day ,45 to 90 minutes PT Frequency: 5 out of 7 days PT Duration Estimated Length of Stay: 16-18 days OT Intensity: Minimum of 1-2 x/day, 45 to 90 minutes OT Frequency: 5 out of 7 days OT Duration/Estimated Length of Stay: 16-18 SLP Intensity: Minumum of 1-2 x/day, 30 to 90 minutes SLP Frequency: 3 to 5 out of 7 days SLP Duration/Estimated Length of Stay: 14-21 days     Team  Interventions: Nursing Interventions Patient/Family Education, Bladder Management, Bowel Management, Pain Management, Disease Management/Prevention, Skin Care/Wound Management, Dysphagia/Aspiration Precaution Training, Discharge Planning, Psychosocial Support, Cognitive Remediation/Compensation, Medication Management  PT interventions Ambulation/gait training, Balance/vestibular training, Cognitive remediation/compensation, Discharge planning, Community reintegration, DME/adaptive equipment instruction, Functional mobility training, Patient/family education, Neuromuscular re-education, Psychosocial support, Splinting/orthotics, Therapeutic Exercise, Therapeutic Activities, UE/LE Strength taining/ROM, Stair training, UE/LE Coordination activities, Visual/perceptual remediation/compensation, Wheelchair propulsion/positioning  OT Interventions Warden/ranger, Discharge planning, Self Care/advanced ADL retraining, Therapeutic Activities, UE/LE Coordination activities, Therapeutic Exercise, Patient/family education, Functional mobility training, Disease mangement/prevention, Cognitive remediation/compensation, DME/adaptive equipment instruction, Psychosocial support, UE/LE Strength taining/ROM, Wheelchair propulsion/positioning  SLP Interventions Cognitive remediation/compensation, Cueing hierarchy, Functional tasks, Dysphagia/aspiration precaution training, Internal/external aids, Patient/family education  TR Interventions    SW/CM Interventions Discharge Planning, Psychosocial Support, Patient/Family Education   Barriers to Discharge MD  Medical stability and Wound care  Nursing Medical stability, Incontinence, Nutrition means    PT      OT      SLP      SW       Team Discharge Planning: Destination: PT-Home ,OT- Home , SLP-Home Projected Follow-up: PT-Home health PT, 24 hour supervision/assistance, OT-  Home health OT, SLP-24 hour supervision/assistance, Home Health SLP, Outpatient  SLP Projected Equipment Needs: PT-To be determined, OT- To be determined, Tub/shower bench, 3 in 1 bedside comode, SLP-None recommended by SLP Equipment Details: PT- , OT-  Patient/family involved in discharge planning: PT- Patient, Family member/caregiver,  OT-Patient, Family member/caregiver, SLP-Patient, Family member/caregiver  MD ELOS: 12-17d Medical Rehab Prognosis:  Excellent Assessment:  76 y.o. right handed female with history of mitral valve prolapse and mitral regurgitation, CVA, hypertension, stage III CKD, heart block with pacemaker, PAF on long-term anticoagulation/Eliquis. Patient lives with spouse. Reportedly independent and driving prior to admission. Presented 04/15/2017 for evaluation and treatment of symptomatic mitral regurgitation. Recent echocardiogram with normal left ventricular systolic function mild to moderate mitral regurgitation. Follow-up echo showed significant worsening in severity of mitral regurgitation. Underwent Maze procedure 04/16/2017 per Dr. Cornelius Moraswen. Postoperatively patient with 150-250 mL of chest tube output per hour and underwent reexploration of right mini thoracotomy for any bleeding same date 04/16/2017 with no findings of reaccumulated blood with ongoing bleeding Hospital course pain management. Sternal precautions as directed.  Hospital course leukocytosis 21,600 and monitored with latest of 17,200 . Intermittent bouts of confusion and restlessness. Cranial CT scan reviewed, unremarkable for acute process. Subcutaneous Lovenox for DVT prophylaxis and transitioned to Coumadin. Initially maintained on TPN for nutritional support and diet advanced to a dysphagia #2 thin liquid diet    Now requiring 24/7 Rehab RN,MD, as well as CIR level PT, OT and SLP.  Treatment team will focus on ADLs and mobility with goals set at Sup See Team Conference Notes for weekly updates to the plan of care

## 2017-04-29 NOTE — Progress Notes (Signed)
Marcello Fennel, MD Physician Signed Physical Medicine and Rehabilitation  Consult Note Date of Service: 04/24/2017 8:59 AM  Related encounter: Admission (Discharged) from 04/16/2017 in [facility] CARDIOVASCULAR ICU     Expand All Collapse All   [] Hide copied text [] Hover for attribution information      Physical Medicine and Rehabilitation Consult Reason for Consult: Decreased functional mobility Referring Physician: Dr. Cornelius Moras   HPI: DAMARA KLUNDER is a 76 y.o. right handed female with history of mitral valve prolapse and mitral regurgitation, CVA, hypertension, stage III CKD, heart block with pacemaker, PAF on long-term anticoagulation. Patient lives with spouse. Reportedly independent and driving prior to admission. Per chart review, husband, and daughter, pt presented 04/15/2017 for evaluation and treatment of symptomatic mitral regurgitation. Recent echocardiogram with normal left ventricular systolic function mild to moderate mitral regurgitation. Follow-up echo showed significant worsening in severity of mitral regurgitation. Underwent Maze procedure 04/16/2017 per Dr. Cornelius Moras. Hospital course pain management. Sternal precautions as directed. Hospital course leukocytosis 21,600 and monitored. Intermittent bouts of confusion and restlessness. Cranial CT scan reviewed, unremarkable for acute process. Subcutaneous Lovenox for DVT prophylaxis. Physical therapy evaluation completed with recommendations of physical medicine rehabilitation consult.  Review of Systems  Unable to perform ROS: Acuity of condition       Past Medical History:  Diagnosis Date  . Allergic rhinitis   . Anemia   . CKD (chronic kidney disease), stage III (HCC)    stage III  . DJD (degenerative joint disease)   . Glomerulonephritis    Dr [name]  . Hyperlipidemia   . Hyperparathyroidism (HCC)   . Hypertension   . Hypothyroidism   . IBS (irritable bowel syndrome)   . Interstitial  cystitis   . Mitral regurgitation   . Mobitz II    a. s/p STJ dual chamber PPM   . MVP (mitral valve prolapse)    moderate posterior MVP with moderate MR and grade II diasotlic dysfunction  . PAC (premature atrial contraction)   . PAF (paroxysmal atrial fibrillation) (HCC)     note on pacer check. CHADS2VASC score is 4 now on Eliquis.  . Paroxysmal atrial fibrillation (HCC)    CHADS2VASC score is 4  . PVC's (premature ventricular contractions)   . S/P minimally invasive maze operation for atrial fibrillation 04/16/2017   Complete bilateral atrial lesion set using cryothermy and bipolar radiofrequency ablation with clipping of LA appendage via right mini thoracotomy approach  . S/P minimally invasive mitral valve repair 04/16/2017   Complex valvuloplasty including triangular resection of posterior leaflet, artificial Gore-tex neochords x6 and Sorin Memo 3D ring annuloplasty ([id number], size 32, serial # [id number])  . S/P placement of cardiac pacemaker   . Small vessel disease, cerebrovascular         Past Surgical History:  Procedure Laterality Date  . ABDOMINAL HYSTERECTOMY    . BREAST BIOPSY Right   . CLIPPING OF ATRIAL APPENDAGE  04/16/2017   Procedure: CLIPPING OF ATRIAL APPENDAGE using AtriCure Pro2 clip 45;  Surgeon: Purcell Nails, MD;  Location: [facility] OR;  Service: Open Heart Surgery;;  . CYSTOSTOMY W/ BLADDER BIOPSY    . EP IMPLANTABLE DEVICE N/A 01/07/2015   STJ dual chamber pacemaker implanted by Dr [name] for 2:1 heart block  . MINIMALLY INVASIVE MAZE PROCEDURE N/A 04/16/2017   Procedure: MINIMALLY INVASIVE MAZE PROCEDURE;  Surgeon: Purcell Nails, MD;  Location: [facility] OR;  Service: Open Heart Surgery;  Laterality: N/A;  . MITRAL VALVE REPAIR Right 04/16/2017  Procedure: MINIMALLY INVASIVE MITRAL VALVE REPAIR (MVR);  Surgeon: Purcell Nails, MD;  Location: [facility] OR;  Service: Open Heart Surgery;  Laterality: Right;  . MITRAL VALVE REPAIR Right 04/16/2017    Procedure: RE-EXPLORATION RIGHT THORACOTOMY FOR BLEEDING;  Surgeon: Purcell Nails, MD;  Location[facility] OR;  Service: Open Heart Surgery;  Laterality: Right;  . PERIPHERAL VASCULAR CATHETERIZATION N/A 02/03/2015   Procedure: Upper Extremity Venography;  Surgeon: [name], MD;  Location: [facility] INVASIVE CV LAB;  Service: Cardiovascular;  Laterality: N/A;  . RIGHT/LEFT HEART CATH AND CORONARY ANGIOGRAPHY N/A 03/26/2017   Procedure: RIGHT/LEFT HEART CATH AND CORONARY ANGIOGRAPHY;  Surgeon: [name], MD;  Location: [facility] INVASIVE CV LAB;  Service: Cardiovascular;  Laterality: N/A;  . TEE WITHOUT CARDIOVERSION N/A 03/18/2017   Procedure: TRANSESOPHAGEAL ECHOCARDIOGRAM (TEE);  Surgeon: [name], MD;  Location: [facility] ENDOSCOPY;  Service: Cardiovascular;  Laterality: N/A;  . TEE WITHOUT CARDIOVERSION N/A 04/16/2017   Procedure: TRANSESOPHAGEAL ECHOCARDIOGRAM (TEE);  Surgeon: Purcell Nails, MD;  Location: [facility] OR;  Service: Open Heart Surgery;  Laterality: N/A;  . TONSILLECTOMY          Family History  Problem Relation Age of Onset  . Heart disease Mother   . Stroke Mother   . Hypertension Mother   . Heart disease Father   . Heart attack Father   . Ovarian cancer Sister   . Heart disease Brother   . Rheum arthritis Sister   . Melanoma Brother   . Brain cancer Brother   . Heart attack Brother   . Hypertension Brother   . Hypertension Sister   . Colon cancer Neg Hx    Social History:  reports that she has quit smoking. She has never used smokeless tobacco. She reports that she does not drink alcohol or use drugs. Allergies:       Allergies  Allergen Reactions  . Pepcid [Famotidine] Swelling and Other (See Comments)    "Throat Swelling"   . Allopurinol Other (See Comments)    Caused headaches  . Amlodipine Other (See Comments)    PEDAL EDEMA   . Colchicine Other (See Comments)    UNSPECIFIED REACTION   . Contrast Media [Iodinated Diagnostic  Agents] Hives    IVP dye per patient  . Prilosec [Omeprazole] Nausea Only  . Atorvastatin Other (See Comments)    UNSPECIFIED REACTION   . Cephalexin Itching and Rash  . Ciprofloxacin Itching and Rash  . Erythromycin Itching and Rash  . Sulfonamide Derivatives Itching and Rash  . Tetracycline Itching and Rash            Facility-Administered Medications Prior to Admission  Medication Dose Route Frequency Provider Last Rate Last Dose  . 0.9 %  sodium chloride infusion  500 mL Intravenous Continuous Armbruster, Willaim Rayas, MD             Medications Prior to Admission  Medication Sig Dispense Refill  . apixaban (ELIQUIS) 5 MG TABS tablet Take 1 tablet (5 mg total) by mouth 2 (two) times daily. (Patient not taking: Reported on 04/15/2017) 60 tablet 11  . atenolol (TENORMIN) 50 MG tablet Take 1 tablet (50 mg total) by mouth daily. (Patient taking differently: Take 50 mg by mouth 2 (two) times daily. ) 30 tablet 11  . conjugated estrogens (PREMARIN) vaginal cream Place 1 Applicatorful vaginally every 3 (three) days.     . fluticasone (FLONASE) 50 MCG/ACT nasal spray Place 2 sprays into both nostrils daily.    [name]  furosemide (LASIX) 20 MG tablet Take 1 tablet (20 mg total) by mouth daily. 90 tablet 3  . levocetirizine (XYZAL) 5 MG tablet Take 5 mg by mouth every evening.    [name]. levothyroxine (SYNTHROID, LEVOTHROID) 75 MCG tablet Take 75 mcg by mouth See admin instructions. Take 75 mcg by mouth daily around 0300    . naproxen sodium (ANAPROX) 220 MG tablet Take 220 mg by mouth 2 (two) times daily as needed (for pain).     . NON FORMULARY 1 each by Other route See admin instructions. Allergy injections once weekly    . Probiotic Product (PROBIOTIC DAILY PO) Take 1 tablet by mouth daily.    . hydrocortisone valerate ointment (WEST-CORT) 0.2 % Apply 1 application topically 2 (two) times daily as needed (for itching).       Home: Home Living Family/patient expects to be  discharged to:: Inpatient rehab Additional Comments: pt was living at home with spouse in 1 story home with 3 STE, walk in shower with seat in it  Functional History: Prior Function Level of Independence: Independent Comments: driving, dressing, bathing, walking without AD Functional Status:  Mobility: Bed Mobility Overal bed mobility: Needs Assistance Bed Mobility: Sit to Supine Sit to supine: Min assist General bed mobility comments: assist for descending trunk into bed and LE management back into bed Transfers Overall transfer level: Needs assistance Equipment used:  [name](Eva walker) Transfers: Sit to/from Stand Sit to Stand: Min assist, +2 safety/equipment Stand pivot transfers: Max assist, +2 safety/equipment General transfer comment:  required tactile cues at upper thoracic back to lean forward as well as cues to hug pillow.  Ambulation/Gait Ambulation/Gait assistance: Min assist, +2 physical assistance (3rd person for chair follow) Ambulation Distance (Feet): 175 Feet Assistive device:  (EVa walker) Gait Pattern/deviations: Step-through pattern, Decreased stride length, Trunk flexed, Wide base of support, Drifts right/left General Gait Details: Pt was able to use Carley HammedEva wlaker with constant cues to stay close toEva walker as well as cue for upright posture.  Pt needed contant cues to sequence steps and Carley Hammedva walker. Does well with encouragament.  3 rd person for chair follow.   ADL:  Cognition: Cognition Overall Cognitive Status: Impaired/Different from baseline Orientation Level: Oriented to person, Disoriented to place, Disoriented to time, Disoriented to situation Cognition Arousal/Alertness: Awake/alert Behavior During Therapy: Flat affect Overall Cognitive Status: Impaired/Different from baseline Area of Impairment: Attention, Safety/judgement, Problem solving Current Attention Level: Focused Safety/Judgement: Decreased awareness of safety, Decreased awareness of  deficits Problem Solving: Slow processing, Decreased initiation, Difficulty sequencing, Requires verbal cues, Requires tactile cues General Comments: pt with lethargy/fatigue and had difficulty follow commands during MMT  Blood pressure 119/73, pulse 70, temperature 97.6 F (36.4 C), temperature source Oral, resp. rate 20, height 5' 6.5" (1.689 m), weight 61.5 kg (135 lb 9.6 oz), SpO2 95 %. Physical Exam  Vitals reviewed. Constitutional: She appears well-developed and well-nourished.  HENT:  Head: Normocephalic and atraumatic.  Eyes: EOM are normal. Right eye exhibits no discharge. Left eye exhibits no discharge.  Neck: Normal range of motion. Neck supple. No thyromegaly present.  Cardiovascular:  Cardiac rate controlled  Respiratory: Effort normal and breath sounds normal. No respiratory distress.  GI: Soft. Bowel sounds are normal. She exhibits no distension.  Musculoskeletal: She exhibits no edema or tenderness.  Neurological: She is alert.  Easily distracted.  Pleasantly confused.  She does provide her name and her husband's name. Very limited awareness of her deficits.  Followed simple commands Motor: 4+/5 grossly throughout  Skin: Skin is warm and dry.  Psychiatric: Her affect is blunt. Her speech is delayed. She is slowed. Cognition and memory are impaired.      Assessment/Plan: Diagnosis: Debility Labs and images independently reviewed.  Records reviewed and summated above.  1. Does the need for close, 24 hr/day medical supervision in concert with the patient's rehab needs make it unreasonable for this patient to be served in a less intensive setting? Potentially 2. Co-Morbidities requiring supervision/potential complications: mitral valve prolapse and mitral regurgitation (Monitor in accordance with increased physical activity and avoid UE resistance excercises), CVA, HTN (monitor and provide prns in accordance with increased physical exertion and pain), stage III CKD  (avoid nephrotoxic meds), heart block with pacemaker, PAF (monitor HR with increased mobility, cont meds), post-op pain management (Biofeedback training with therapies to help reduce reliance on opiate pain medications, monitor pain control during therapies, and sedation at rest and titrate to maximum efficacy to ensure participation and gains in therapies), leukocytosis (cont to monitor for signs and symptoms of infection, further workup if indicated), Labile blood glucose (Monitor in accordance with exercise and adjust meds as necessary), hypernatremia (cont to monitor, treat if necessary), thrombocytopenia (trending down, < 60,000/mm3 no resistive exercise) 3. Due to bladder management, safety, disease management, pain management and patient education, does the patient require 24 hr/day rehab nursing? Potentially 4. Does the patient require coordinated care of a physician, rehab nurse, PT (1-2 hrs/day, 5 days/week) and OT (1-2 hrs/day, 5 days/week) to address physical and functional deficits in the context of the above medical diagnosis(es)? Potentially Addressing deficits in the following areas: balance, endurance, locomotion, strength, transferring, bathing, dressing, toileting, cognition and psychosocial support 5. Can the patient actively participate in an intensive therapy program of at least 3 hrs of therapy per day at least 5 days per week? Yes 6. The potential for patient to make measurable gains while on inpatient rehab is excellent 7. Anticipated functional outcomes upon discharge from inpatient rehab are supervision  with PT, supervision with OT, modified independent and supervision with SLP. 8. Estimated rehab length of stay to reach the above functional goals is: 6-10 days. 9. Anticipated D/C setting: Home 10. Anticipated post D/C treatments: HH therapy and Home excercise program 11. Overall Rehab/Functional Prognosis: excellent  RECOMMENDATIONS: This patient's condition is appropriate  for continued rehabilitative care in the following setting: Request reeval by therapies; patient making signficant gains with limitations likely stemming from cognition. Will consider CIR if deficits persist as cognition improves once medically stable. Patient has agreed to participate in recommended program. Potentially Note that insurance prior authorization may be required for reimbursement for recommended care.  Comment: Rehab Admissions Coordinator to follow up.  Maryla Morrow, MD, ABPMR Charlton Amor., PA-C 04/24/2017    Revision History                        Routing History

## 2017-04-30 ENCOUNTER — Inpatient Hospital Stay (HOSPITAL_COMMUNITY): Payer: Medicare Other

## 2017-04-30 ENCOUNTER — Inpatient Hospital Stay (HOSPITAL_COMMUNITY): Payer: Medicare Other | Admitting: Physical Therapy

## 2017-04-30 ENCOUNTER — Inpatient Hospital Stay (HOSPITAL_COMMUNITY): Payer: Medicare Other | Admitting: Occupational Therapy

## 2017-04-30 LAB — PROTIME-INR
INR: 2.38
Prothrombin Time: 25.8 seconds — ABNORMAL HIGH (ref 11.4–15.2)

## 2017-04-30 MED ORDER — MENTHOL 3 MG MT LOZG
1.0000 | LOZENGE | OROMUCOSAL | Status: DC | PRN
  Administered 2017-05-01: 3 mg via ORAL
  Filled 2017-04-30: qty 9

## 2017-04-30 MED ORDER — QUETIAPINE FUMARATE 50 MG PO TABS
50.0000 mg | ORAL_TABLET | Freq: Every day | ORAL | Status: DC
  Administered 2017-04-30 – 2017-05-03 (×4): 50 mg via ORAL
  Filled 2017-04-30 (×4): qty 1

## 2017-04-30 MED ORDER — HYDROCERIN EX CREA
TOPICAL_CREAM | Freq: Two times a day (BID) | CUTANEOUS | Status: DC
  Administered 2017-04-30 – 2017-05-04 (×8): via TOPICAL
  Filled 2017-04-30: qty 113

## 2017-04-30 MED ORDER — POTASSIUM CHLORIDE 20 MEQ/15ML (10%) PO SOLN
10.0000 meq | Freq: Every day | ORAL | Status: DC
  Administered 2017-04-30 – 2017-05-04 (×5): 10 meq via ORAL
  Filled 2017-04-30 (×5): qty 15

## 2017-04-30 NOTE — Progress Notes (Signed)
Physical Therapy Session Note  Patient Details  Name: Amanda [name] MRN: [id number] Date of Birth: [birth year]  Today's Date: 04/30/2017 PT Individual Time: 0805-0900 PT Individual Time Calculation (min): 55 min   Short Term Goals: Week 1:  PT Short Term Goal 1 (Week 1): Pt will recall 1/3 sternal precautions with mod cues PT Short Term Goal 2 (Week 1): Pt will transfer with mod assist consistently from a variety of surfaces PT Short Term Goal 3 (Week 1): Pt will ambulate 100' with LRAD and min assist PT Short Term Goal 4 (Week 1): Pt will initiate stair negotiation with PT  Skilled Therapeutic Interventions/Progress Updates: Pt presented in bed completing breakfast with NT present. Performed IS x 10 516m with cues for inhalation. Performed bed mobility min guard with increased time and cues for decreased use of UE. Pt requesting to use toilet, ambulated HHA with pt intermittent reaching for walls and performing sit to/from stand at toilet with min guard (+void). Pt ambulated to rehab gym HHA with cues for increasing step length and cadence. Pt with x 2 staggering steps which she was able to recover from. Vital taken after gait SpO2 87% resolving quickly to 96% with cues for PLB and HR 100. Pt participated in [place] with a score of 32/56 indicated increased risk of falls and would be indicative for use of AD.  During balance assessment MD met with pt for daily assessment, discussed current sternal precautions and per MD are currently not necessary as no chest incision. Pt participated in standing balance activities including toe taps however pt required frequent rest breaks. Pt transported back to room via w/c due to fatigue and returned to bed per pt request. Pt left with call bell within reach and current needs met.      Therapy Documentation Precautions:  Precautions Precautions: Fall, Sternal Restrictions Weight Bearing Restrictions: No Other Position/Activity  Restrictions: sternal precautions  See Function Navigator for Current Functional Status.   Therapy/Group: Individual Therapy  Tamarius Rosenfield  Brooklee Michelin, PTA  04/30/2017, 4:27 PM

## 2017-04-30 NOTE — Progress Notes (Signed)
Physical Therapy Session Note  Patient Details  Name: Amanda [name] MRN: [id number] Date of Birth: 01-28-1941  Today's Date: 04/30/2017 PT Individual Time: 1000-1028 PT Individual Time Calculation (min): 28 min   Short Term Goals: Week 1:  PT Short Term Goal 1 (Week 1): Pt will recall 1/3 sternal precautions with mod cues PT Short Term Goal 2 (Week 1): Pt will transfer with mod assist consistently from a variety of surfaces PT Short Term Goal 3 (Week 1): Pt will ambulate 100' with LRAD and min assist PT Short Term Goal 4 (Week 1): Pt will initiate stair negotiation with PT  Skilled Therapeutic Interventions/Progress Updates:    Pt supine in bed upon PT arrival, agreeable to therapy tx and denies pain. Pt transferred supine to sitting with min assist to maintain sternal precautions. Pt reported needing to use the bathroom. Pt ambulated 2 x 10 ft from bed<>toilet with min assist and performed all toileting with supervision.Pt performed 2 x 5 sit to stands without use of UEs in order to maintain precautions and for focus on LE strengthening, mod assist fading to min assist. Pt ambulated 2 x 60 ft with min assist for balance, no AD and verbal cues not to reach for walls/furniture. Pt left seated in w/c at end of session for SLP.   Therapy Documentation Precautions:  Precautions Precautions: Fall, Sternal Restrictions Weight Bearing Restrictions: No Other Position/Activity Restrictions: sternal precautions  See Function Navigator for Current Functional Status.   Therapy/Group: Individual Therapy  Cresenciano Genre, PT, DPT 04/30/2017, 10:11 AM

## 2017-04-30 NOTE — Discharge Instructions (Addendum)
Inpatient Rehab Discharge Instructions  Amanda [name] Discharge date and time: No discharge date for patient encounter.   Activities/Precautions/ Functional Status: Activity: activity as tolerated Diet: Dysphagia #2 thin liquids Wound Care: keep wound clean and dry Functional status:  ___ No restrictions     ___ Walk up steps independently ___ 24/7 supervision/assistance   ___ Walk up steps with assistance ___ Intermittent supervision/assistance  ___ Bathe/dress independently ___ Walk with walker     _x__ Bathe/dress with assistance ___ Walk Independently    ___ Shower independently ___ Walk with assistance    ___ Shower with assistance ___ No alcohol     ___ Return to work/school ________  COMMUNITY REFERRALS UPON DISCHARGE:   Home Health:   PT     OT     ST    RN     Agency:  [facility] Phone:  [phone]  Medical Equipment/Items Ordered:  Rolling walker and shower seat (you may refuse this if the one you are borrowing works)  Designer, industrial/productAgency/Supplier:  [facility]         Phone:  [phone]  Special Instructions: No driving  Home health nurse to check [facility] [phone] fax number [phone]   My questions have been answered and I understand these instructions. I will adhere to these goals and the provided educational materials after my discharge from the hospital.  Patient/Caregiver Signature _______________________________ Date __________  Clinician Signature _______________________________________ Date __________  Please bring this form and your medication list with you to all your follow-up doctor's appointments. Information on my medicine - Coumadin   (Warfarin)  This medication education was reviewed with me or my healthcare representative as part of my discharge preparation.   Why was Coumadin prescribed for you? Coumadin was prescribed for you because you have a blood clot or a medical  condition that can cause an increased risk of forming blood clots. Blood clots can cause serious health problems by blocking the flow of blood to the heart, lung, or brain. Coumadin can prevent harmful blood clots from forming. As a reminder your indication for Coumadin is:   Stroke Prevention Because Of Atrial Fibrillation  What test will check on my response to Coumadin? While on Coumadin (warfarin) you will need to have an INR test regularly to ensure that your dose is keeping you in the desired range. The INR (international normalized ratio) number is calculated from the result of the laboratory test called prothrombin time (PT).  If an INR APPOINTMENT HAS NOT ALREADY BEEN MADE FOR YOU please schedule an appointment to have this lab work done by your health care provider within 7 days. Your INR goal is usually a number between:  2 to 3 or your provider may give you a more narrow range like 2-2.5.  Ask your health care provider during an office visit what your goal INR is.  What  do you need to  know  About  COUMADIN? Take Coumadin (warfarin) exactly as prescribed by your healthcare provider about the same time each day.  DO NOT stop taking without talking to the doctor who prescribed the medication.  Stopping without other blood clot prevention medication to take the place of Coumadin may increase your risk of developing a new clot or stroke.  Get refills before you run out.  What do you do if you miss a dose? If you miss a dose, take it as soon as you  remember on the same day then continue your regularly scheduled regimen the next day.  Do not take two doses of Coumadin at the same time.  Important Safety Information A possible side effect of Coumadin (Warfarin) is an increased risk of bleeding. You should call your healthcare provider right away if you experience any of the following: ? Bleeding from an injury or your nose that does not stop. ? Unusual colored urine (red or dark brown) or  unusual colored stools (red or black). ? Unusual bruising for unknown reasons. ? A serious fall or if you hit your head (even if there is no bleeding).  Some foods or medicines interact with Coumadin (warfarin) and might alter your response to warfarin. To help avoid this: ? Eat a balanced diet, maintaining a consistent amount of Vitamin K. ? Notify your provider about major diet changes you plan to make. ? Avoid alcohol or limit your intake to 1 drink for women and 2 drinks for men per day. (1 drink is 5 oz. wine, 12 oz. beer, or 1.5 oz. liquor.)  Make sure that ANY health care provider who prescribes medication for you knows that you are taking Coumadin (warfarin).  Also make sure the healthcare provider who is monitoring your Coumadin knows when you have started a new medication including herbals and non-prescription products.  Coumadin (Warfarin)  Major Drug Interactions  Increased Warfarin Effect Decreased Warfarin Effect  Alcohol (large quantities) Antibiotics (esp. Septra/Bactrim, Flagyl, Cipro) Amiodarone (Cordarone) Aspirin (ASA) Cimetidine (Tagamet) Megestrol (Megace) NSAIDs (ibuprofen, naproxen, etc.) Piroxicam (Feldene) Propafenone (Rythmol SR) Propranolol (Inderal) Isoniazid (INH) Posaconazole (Noxafil) Barbiturates (Phenobarbital) Carbamazepine (Tegretol) Chlordiazepoxide (Librium) Cholestyramine (Questran) Griseofulvin Oral Contraceptives Rifampin Sucralfate (Carafate) Vitamin K   Coumadin (Warfarin) Major Herbal Interactions  Increased Warfarin Effect Decreased Warfarin Effect  Garlic Ginseng Ginkgo biloba Coenzyme Q10 Green tea St. Johns wort    Coumadin (Warfarin) FOOD Interactions  Eat a consistent number of servings per week of foods HIGH in Vitamin K (1 serving =  cup)  Collards (cooked, or boiled & drained) Kale (cooked, or boiled & drained) Mustard greens (cooked, or boiled & drained) Parsley *serving size only =  cup Spinach (cooked, or  boiled & drained) Swiss chard (cooked, or boiled & drained) Turnip greens (cooked, or boiled & drained)  Eat a consistent number of servings per week of foods MEDIUM-HIGH in Vitamin K (1 serving = 1 cup)  Asparagus (cooked, or boiled & drained) Broccoli (cooked, boiled & drained, or raw & chopped) Brussel sprouts (cooked, or boiled & drained) *serving size only =  cup Lettuce, raw (green leaf, endive, romaine) Spinach, raw Turnip greens, raw & chopped   These websites have more information on Coumadin (warfarin):  http://www.king-russell.com/; https://www.hines.net/;

## 2017-04-30 NOTE — Progress Notes (Signed)
Occupational Therapy Note  Patient Details  Name: LISMARIE FIELDING MRN: [id number] Date of Birth: 1941/04/25  Today's Date: 04/30/2017 OT Individual Time: 1345-1430 OT Individual Time Calculation (min): 45 min   Pain: c/o back pain - modified through positioning  Pt seen for balance exercises with a focus on sit >< stand, transfers with RW and light UE exercise. Pt is no longer on sternal precautions. She initially needed min A to stand from recliner with cues for forward lean.  She transferred to bed at elevated height and was able to stand up 10x with S.  C/o back pain from sitting unsupported. Sat in arm chair for arm circle exercises and knee extensions.  Worked on sit to stand from arm chair with close S.  Practiced using RW with min A and mod cues to ambulate to bathroom.  Pt toileted with close S. Ambulated back to bed.  Pt resting in bed with spouse and daughter in room.      SAGUIER,JULIA 04/30/2017, 1:39 PM

## 2017-04-30 NOTE — Progress Notes (Signed)
ANTICOAGULATION CONSULT NOTE - Follow Up Consult  Pharmacy Consult for warfarin Indication: atrial fibrillation  Allergies  Allergen Reactions  . Pepcid [Famotidine] Swelling and Other (See Comments)    "Throat Swelling"   . Allopurinol Other (See Comments)    Caused headaches  . Amlodipine Other (See Comments)    PEDAL EDEMA   . Colchicine Other (See Comments)    UNSPECIFIED REACTION   . Contrast Media [Iodinated Diagnostic Agents] Hives    IVP dye per patient  . Prilosec [Omeprazole] Nausea Only  . Atorvastatin Other (See Comments)    UNSPECIFIED REACTION   . Cephalexin Itching and Rash  . Ciprofloxacin Itching and Rash  . Erythromycin Itching and Rash  . Sulfonamide Derivatives Itching and Rash  . Tetracycline Itching and Rash    Patient Measurements: Height: 5' 6.5" (168.9 cm) Weight: 141 lb (64 kg) IBW/kg (Calculated) : 60.45  Vital Signs: Temp: 97.6 F (36.4 C) (10/23 0500) Temp Source: Oral (10/23 0500) BP: 102/56 (10/23 0500) Pulse Rate: 78 (10/23 0500)  Labs:  Recent Labs  04/28/17 0534 04/29/17 0642 04/30/17 1200  HGB  --  11.8*  --   HCT  --  35.6*  --   PLT  --  192  --   LABPROT 22.6* 24.1* 25.8*  INR 2.01 2.18 2.38  CREATININE  --  1.44*  --     Estimated Creatinine Clearance: 30.8 mL/min (A) (by C-G formula based on SCr of 1.44 mg/dL (H)). Assessment: 39 YOF with h/o Afib on warfarin s/p mitral valve repair and clipping of atrial appendage. INR 2.38, therapeutic, hgb stable, pltc 192K, improved.  S/p fluconazole 150mg  x1 10/20   Goal of Therapy:  INR 2-3 Monitor platelets by anticoagulation protocol: Yes   Plan:  -Warfarin 2mg  daily -Monitor daily PT/INR   Bayard Hugger, PharmD, BCPS  Clinical Pharmacist  Pager: [phone]   04/30/2017 2:01 PM

## 2017-04-30 NOTE — Progress Notes (Signed)
[place] PHYSICAL MEDICINE & REHABILITATION     PROGRESS NOTE  Subjective/Complaints:  No issues overnite  ROS: Denies CP, nausea, vomiting, diarrhea.  Objective: Vital Signs: Blood pressure (!) 102/56, pulse 78, temperature 97.6 F (36.4 C), temperature source Oral, resp. rate 17, height 5' 6.5" (1.689 m), weight 64 kg (141 lb), SpO2 (!) 79 %. No results found.  Recent Labs  04/29/17 0642  WBC 13.1*  HGB 11.8*  HCT 35.6*  PLT 192    Recent Labs  04/29/17 0642  NA 135  K 3.3*  CL 104  GLUCOSE 111*  BUN 35*  CREATININE 1.44*  CALCIUM 8.1*   CBG (last 3)  No results for input(s): GLUCAP in the last 72 hours.  Wt Readings from Last 3 Encounters:  04/30/17 64 kg (141 lb)  04/26/17 62.9 kg (138 lb 9.6 oz)  04/15/17 64.4 kg (142 lb)   INR 2.1 Physical Exam:  BP (!) 102/56 (BP Location: Left Arm)   Pulse 78   Temp 97.6 F (36.4 C) (Oral)   Resp 17   Ht 5' 6.5" (1.689 m)   Wt 64 kg (141 lb)   SpO2 (!) 79%   BMI 22.42 kg/m  Constitutional: She appears well-nourished. No distress.  HENT: Normocephalic. Atraumatic. Eyes: EOM are normal. No discharge. Cardiovascular: RRR. No JVD. Respiratory: Unlabored. Clear to auscultation  GI: Bowel sounds are normal. She exhibits no distension.  Musculoskeletal: She exhibits no tenderness. She exhibits no edema.  Skin. Warm and dry. Intact  Neurological: She is alert.  Motor: LUE/LLE: 4+/5 proximal to distal  RUE/RLE: 4/5 proximal to distal (stable).  Psych: Pleasant and cooperative   Assessment/Plan: 1. Functional deficits secondary to debility which require 3+ hours per day of interdisciplinary therapy in a comprehensive inpatient rehab setting. Physiatrist is providing close team supervision and 24 hour management of active medical problems listed below. Physiatrist and rehab team continue to assess barriers to discharge/monitor patient progress toward functional and medical goals.  Function:  Bathing Bathing  position   Position: Shower  Bathing parts Body parts bathed by patient: Right arm, Left arm, Chest, Abdomen, Front perineal area, Right upper leg, Left upper leg, Left lower leg, Buttocks, Right lower leg Body parts bathed by helper: Back  Bathing assist Assist Level: Touching or steadying assistance(Pt > 75%)      Upper Body Dressing/Undressing Upper body dressing   What is the patient wearing?: Pull over shirt/dress     Pull over shirt/dress - Perfomed by patient: Thread/unthread right sleeve, Thread/unthread left sleeve, Put head through opening, Pull shirt over trunk Pull over shirt/dress - Perfomed by helper: Pull shirt over trunk        Upper body assist Assist Level: Touching or steadying assistance(Pt > 75%)      Lower Body Dressing/Undressing Lower body dressing   What is the patient wearing?: Pants, Underwear, Non-skid slipper socks Underwear - Performed by patient: Thread/unthread right underwear leg, Thread/unthread left underwear leg, Pull underwear up/down   Pants- Performed by patient: Thread/unthread right pants leg, Thread/unthread left pants leg, Pull pants up/down Pants- Performed by helper: Thread/unthread left pants leg, Pull pants up/down Non-skid slipper socks- Performed by patient: Don/doff right sock, Don/doff left sock   Socks - Performed by patient: Don/doff right sock, Don/doff left sock   Shoes - Performed by patient: Don/doff right shoe, Don/doff left shoe            Lower body assist Assist for lower body dressing: Touching or steadying  assistance (Pt > 75%)      Toileting Toileting Toileting activity did not occur: No continent bowel/bladder event Toileting steps completed by patient: Adjust clothing prior to toileting, Performs perineal hygiene, Adjust clothing after toileting Toileting steps completed by helper: Performs perineal hygiene, Adjust clothing after toileting Toileting Assistive Devices: Grab bar or rail  Toileting assist  Assist level: Touching or steadying assistance (Pt.75%)   Transfers Chair/bed transfer   Chair/bed transfer method: Stand pivot Chair/bed transfer assist level: Maximal assist (Pt 25 - 49%/lift and lower) (per Melony Overly, NT report) Chair/bed transfer assistive device: Other (Heart shaped pillow)     Locomotion Ambulation     Max distance: 80 Assist level: Moderate assist (Pt 50 - 74%)   Wheelchair          Cognition Comprehension Comprehension assist level: Understands basic 25 - 49% of the time/ requires cueing 50 - 75% of the time  Expression Expression assist level: Expresses basic 50 - 74% of the time/requires cueing 25 - 49% of the time. Needs to repeat parts of sentences.  Social Interaction Social Interaction assist level: Interacts appropriately 25 - 49% of time - Needs frequent redirection.  Problem Solving Problem solving assist level: Solves basic 25 - 49% of the time - needs direction more than half the time to initiate, plan or complete simple activities  Memory Memory assist level: Recognizes or recalls less than 25% of the time/requires cueing greater than 75% of the time    Medical Problem List and Plan:  1. Debility secondary to Mitral repair and modified Maze procedure for symptomatic mitral regurgitation 04/16/2017 /multi-medical.No need for sternal prec CIR PT, OT- Team conf in am 2. DVT Prophylaxis/Anticoagulation: Coumadin per pharmacy protocol   INR therapeutic on 10/22 3. Pain Management: Tylenol as needed  4. Mood: Seroquel 50 mg daily at bedtime. Cranial CT reviewed, negative for acute changes  5. Neuropsych: This patient is not fully capable of making decisions on his own behalf.  6. Skin/Wound Care: Routine skin checks  7. Fluids/Electrolytes/Nutrition: Routine I&Os, hypokalemia. We'll supplement 8. Dysphagia. Dysphagia 2 thin liquids. Follow-up speech therapy  9. CKD stage III. Followed in the past by Dr. Aurelio Jew 10/22 BMP Latest Ref  Rng & Units 04/29/2017 04/26/2017 04/25/2017  Glucose 65 - 99 mg/dL 408(X) 448(J) 856(D)  BUN 6 - 20 mg/dL 14(H) 70(Y) 63(Z)  Creatinine 0.44 - 1.00 mg/dL 8.58(I) 5.02(D) 7.41(O)  BUN/Creat Ratio 12 - 28 - - -  Sodium 135 - 145 mmol/L 135 149(H) 151(H)  Potassium 3.5 - 5.1 mmol/L 3.3(L) 3.8 3.0(L)  Chloride 101 - 111 mmol/L 104 115(H) 109  CO2 22 - 32 mmol/L 25 28 33(H)  Calcium 8.9 - 10.3 mg/dL 8.1(L) 8.5(L) 9.0   10. Hypertension. Tenormin 50 mg daily   Controlled 10/23 Vitals:   04/29/17 0550 04/30/17 0500  BP: (!) 128/45 (!) 102/56  Pulse: 88 78  Resp: 12 17  Temp: 97.8 F (36.6 C) 97.6 F (36.4 C)  SpO2: 96% (!) 79%   11. Leukocytosis. Improving.   WBC 16.1 on 10/19 , now down to 13K 12. Hypothyroidism. Synthroid  13. SOB: Improved  Desats briefly in PT off O2 needs cues for deep breathing 14. Oral thrush   Diflucan ordered 10/20  Nystatin swish and swallow  LOS (Days) 4 A FACE TO FACE EVALUATION WAS PERFORMED  Erick Colace 04/30/2017 8:53 AM

## 2017-04-30 NOTE — Progress Notes (Signed)
Speech Language Pathology Daily Session Note  Patient Details  Name: Amanda [name] MRN: [id number] Date of Birth: [birth year]  Today's Date: 04/30/2017 SLP Individual Time: 3570-1779 SLP Individual Time Calculation (min): 52 min  Short Term Goals: Week 1: SLP Short Term Goal 1 (Week 1): Pt will consume dys 2 textures and thin liquids with min verbal cues to clear solids from the oral cavity.   SLP Short Term Goal 2 (Week 1): Pt will sustain her attention to basic tasks for 5 minutes with mod verbal cues for redirection.   SLP Short Term Goal 3 (Week 1): Pt will utilize external aids to orient to place, date, and situation with mod assist verbal cues.  SLP Short Term Goal 4 (Week 1): Pt will complete basic, familiar tasks with mod verbal cues for functional problem solving.   SLP Short Term Goal 5 (Week 1): Pt will identify at least 2 deficits occurring s/p hospitalization with mod question cues.   SLP Short Term Goal 6 (Week 1): Pt will return demonstration of at least 2 safety precautions during functional tasks with mod verbal cues.  Skilled Therapeutic Interventions: Skilled ST services focused on cognitive goals and family education. SLP communicated with husband about piror level of cognitive ability, stated no impairments and believes cognitive changes are due to medications.Pt demonstrated orientation to situation, place and time with Min A verbal and visual aid cues. Pt demonstrated ability to swallow medication whole with thin liquids, SLP changed medication order and communicated with nursing staff after earlier issues during puree whole administration. SLP facilitated basic problem solving skills with 2-3 step sequencing tasks, pt required Max-Mod A verbal cues to initiate task, possibly due to attention impairment. Pt demonstrated ability to sequence 2 step cards with Mod A verbal cues and total A for 3 step sequencing tasks. Pt required Max A verbal and visual cues to identify  simple problem in picture cards and unable to recall problems given visual aid cues, requiring Max A verbal cues. SLP returned pt to room and educated daughter and husband on impairment in attention leading to impairment in task initiation and educated family on appropriate cueing to increase success during functional tasks. Pt was left in room with call bell within reach. Recommend to continue ST services.     Function:  Eating Eating                 Cognition Comprehension Comprehension assist level: Understands basic 25 - 49% of the time/ requires cueing 50 - 75% of the time  Expression   Expression assist level: Expresses basic 50 - 74% of the time/requires cueing 25 - 49% of the time. Needs to repeat parts of sentences.  Social Interaction Social Interaction assist level: Interacts appropriately 25 - 49% of time - Needs frequent redirection.  Problem Solving Problem solving assist level: Solves basic 25 - 49% of the time - needs direction more than half the time to initiate, plan or complete simple activities  Memory Memory assist level: Recognizes or recalls less than 25% of the time/requires cueing greater than 75% of the time    Pain Pain Assessment Pain Assessment: No/denies pain  Therapy/Group: Individual Therapy  Amanda [name]  [facility] 04/30/2017, 4:17 PM

## 2017-05-01 ENCOUNTER — Inpatient Hospital Stay (HOSPITAL_COMMUNITY): Payer: Medicare Other | Admitting: Physical Therapy

## 2017-05-01 ENCOUNTER — Inpatient Hospital Stay (HOSPITAL_COMMUNITY): Payer: Medicare Other | Admitting: Occupational Therapy

## 2017-05-01 ENCOUNTER — Inpatient Hospital Stay (HOSPITAL_COMMUNITY): Payer: Medicare Other

## 2017-05-01 ENCOUNTER — Inpatient Hospital Stay (HOSPITAL_COMMUNITY): Payer: Medicare Other | Admitting: Speech Pathology

## 2017-05-01 LAB — PROTIME-INR
INR: 3.08
Prothrombin Time: 31.5 seconds — ABNORMAL HIGH (ref 11.4–15.2)

## 2017-05-01 MED ORDER — MUSCLE RUB 10-15 % EX CREA
TOPICAL_CREAM | CUTANEOUS | Status: DC | PRN
  Administered 2017-05-01: 11:00:00 via TOPICAL
  Filled 2017-05-01: qty 85

## 2017-05-01 MED ORDER — WARFARIN 0.5 MG HALF TABLET
0.5000 mg | ORAL_TABLET | Freq: Once | ORAL | Status: AC
  Administered 2017-05-01: 0.5 mg via ORAL
  Filled 2017-05-01: qty 1

## 2017-05-01 MED ORDER — OXYCODONE HCL 5 MG PO TABS
5.0000 mg | ORAL_TABLET | Freq: Once | ORAL | Status: AC
  Administered 2017-05-01: 5 mg via ORAL
  Filled 2017-05-01: qty 1

## 2017-05-01 NOTE — Progress Notes (Signed)
Occupational Therapy Session Note  Patient Details  Name: Amanda [name] MRN: [id number] Date of Birth: Sep 09, 1940  Today's Date: 05/01/2017 OT Individual Time: 1600-1650 OT Individual Time Calculation (min): 50 min    Short Term Goals: Week 1:  OT Short Term Goal 1 (Week 1): Pt will sit to stand with MIN A in prep for clothing management. OT Short Term Goal 2 (Week 1): Pt will stand pivot transfer to toilet wiht MIN A OT Short Term Goal 3 (Week 1): Pt will stand to groom 2/2 grooming items to improve functional endurance with touching A for balance. OT Short Term Goal 4 (Week 1): Pt will thread BLE into pants with supervision and no more than 1 Vc for clothing orientation  Skilled Therapeutic Interventions/Progress Updates: dtr present and was checked off on toilet and in room transfers with her mom Ms. Eichhhorn.   Patient participation as follows today:  Supine to EOB transfer= S Sit to stand from edge of bed at RW= CGA without losses of balance  Patient ambulated with dtr edge of bed to/fr toilet via RW for toilet transfer (bed to 3:1 over toilet) with close supervision and she cued her mother to stay within the walker rather than way behind it  Patient was able to stand at toilet with CGA from dtr for approximately 15 seconds to pull down her pants in prep for toileting (without losses of balance)  Toileting = CGA for balance for pulling down/up panties and pants & patient able to wipe herself  Patient stood at sink at [facility] for approx 7 seconds to wash her hands with close S from dtr  Otherwise, patient stated she was too fatigued to continue balance or stanidn activiities and completed postural alignment and endurance and stretching acgtivities edge of bed (she stated her mid back had hurt earlier and dtr thought her mom's back was tight and mom needed to stretch)  Patient required mod A to complete trunk and pelvic motility.  When asked if mom was this stiff and prior to  surgery dtr stated, "No, she was not, and I think she is also verysleepy from the meds she was given before you arrived."  Patient would benefit from more opportunties to stretch trunk and work on trunk extension and work on trunk and pelvic rotation and motility.   This will increase her safety with balance and independence with self care.  Paitetn was left seated in her w/c with dtr helpig her set up herdinner tray.     Therapy Documentation Precautions:  Precautions Precautions: Fall, Sternal Restrictions Weight Bearing Restrictions: No Other Position/Activity Restrictions: sternal precautions   Pain:denied     Therapy/Group: Individual Therapy  [name] [facility] 05/01/2017, 7:48 PM

## 2017-05-01 NOTE — Progress Notes (Signed)
Speech Language Pathology Daily Session Note  Patient Details  Name: Amanda [name] MRN: [id number] Date of Birth: February 25, 1941  Today's Date: 05/01/2017 SLP Individual Time: 1093-2355 SLP Individual Time Calculation (min): 43 min  Short Term Goals: Week 1: SLP Short Term Goal 1 (Week 1): Pt will consume dys 2 textures and thin liquids with min verbal cues to clear solids from the oral cavity.   SLP Short Term Goal 2 (Week 1): Pt will sustain her attention to basic tasks for 5 minutes with mod verbal cues for redirection.   SLP Short Term Goal 3 (Week 1): Pt will utilize external aids to orient to place, date, and situation with mod assist verbal cues.  SLP Short Term Goal 4 (Week 1): Pt will complete basic, familiar tasks with mod verbal cues for functional problem solving.   SLP Short Term Goal 5 (Week 1): Pt will identify at least 2 deficits occurring s/p hospitalization with mod question cues.   SLP Short Term Goal 6 (Week 1): Pt will return demonstration of at least 2 safety precautions during functional tasks with mod verbal cues.  Skilled Therapeutic Interventions: Skilled ST services focus on swallow and cognitive skills. SLP facilitated transfer from bed to toilet and into wheelchair, pre pt request. Pt demonstrated demonstration of safety precautions during ambulation with Mod impairment requiring Mod A redirection and verbal cues pertaining to awareness of surroundings, and to slow impulsivity to reduce risk of fall.  Pt required Mod A verbal redirection to initiate task during PO consumption of meal time tray and Mod A verbal cues to check for pocketing and clear oral cavity with lingual sweeps and liquid wash with Min s/s aspiration delayed cough, suspected due to oral residue. Pt was abel to sustain attention for 5 minutes with Mod A verbal and visual redirection to task. Pt was left in room with nurse tech to finish supervision PO intake. Recommend to continue skilled St services.     Function:  Eating Eating   Modified Consistency Diet: Yes Eating Assist Level: Set up assist for;More than reasonable amount of time;Helper checks for pocketed food   Eating Set Up Assist For: Opening containers       Cognition Comprehension Comprehension assist level: Understands basic 50 - 74% of the time/ requires cueing 25 - 49% of the time  Expression   Expression assist level: Expresses basic 50 - 74% of the time/requires cueing 25 - 49% of the time. Needs to repeat parts of sentences.  Social Interaction Social Interaction assist level: Interacts appropriately 90% of the time - Needs monitoring or encouragement for participation or interaction.  Problem Solving Problem solving assist level: Solves basic 50 - 74% of the time/requires cueing 25 - 49% of the time  Memory Memory assist level: Recognizes or recalls 25 - 49% of the time/requires cueing 50 - 75% of the time    Pain Pain Assessment Pain Assessment: No/denies pain  Therapy/Group: Individual Therapy  Nandan Willems  [facility] 05/01/2017, 10:58 AM

## 2017-05-01 NOTE — Progress Notes (Signed)
Physical Therapy Session Note  Patient Details  Name: Amanda [name] MRN: [id number] Date of Birth: Apr 05, 1941  Today's Date: 05/01/2017 PT Individual Time: 0945-1030 PT Individual Time Calculation (min): 45 min   Short Term Goals: Week 1:  PT Short Term Goal 1 (Week 1): Pt will recall 1/3 sternal precautions with mod cues PT Short Term Goal 2 (Week 1): Pt will transfer with mod assist consistently from a variety of surfaces PT Short Term Goal 3 (Week 1): Pt will ambulate 100' with LRAD and min assist PT Short Term Goal 4 (Week 1): Pt will initiate stair negotiation with PT  Skilled Therapeutic Interventions/Progress Updates:   Pt received sitting in WC and agreeable to PT. Verbal confirmation from MD for no sternal precautions.   Gait training with RW 2x 5f, min assist on 1st bout and supervision assist on 2nd attempt. Min cues for posture as well as pursed lip breathing to prevent Desat. SpO2 assessed following gait >99% after each bout.   Sit<>stand transfers with min assist x 6 with RW. Moderate cues for positioning as well as proper UE support to improve safety.     Stand pivot transfer with min assist from PT and BUE support on RW. Min cues for gait pattern in turn as well as safety to decrease speed of eccentric movement.   Ascent/descnt of 112 steps (4,6"+8,3") with min assist progressing to supervision assist with BUE support on rails. Min cues for proper UE placement from PT>   Patient returned to room and [facility] and all needs met.          Therapy Documentation Precautions:  Precautions Precautions: Fall, Sternal Restrictions Weight Bearing Restrictions: No Other Position/Activity Restrictions: sternal precautions Vital Signs: Therapy Vitals Temp: (!) 97.5 F (36.4 C) Temp Source: Oral Pulse Rate: 89 Resp: 18 BP: 116/70 Patient Position (if appropriate): Lying Oxygen Therapy SpO2: 98 % O2 Device: Not  Delivered Pain: Pain Assessment Pain Assessment: No/denies pain   See Function Navigator for Current Functional Status.   Therapy/Group: Individual Therapy  ALorie Phenix10/24/2018, 10:51 AM

## 2017-05-01 NOTE — Progress Notes (Signed)
Physical Therapy Session Note  Patient Details  Name: Amanda [name] MRN: [id number] Date of Birth: 18-Feb-1941  Today's Date: 05/01/2017 PT Individual Time: 1400-1450 PT Individual Time Calculation (min): 50 min   Short Term Goals: Week 1:  PT Short Term Goal 1 (Week 1): Pt will recall 1/3 sternal precautions with mod cues PT Short Term Goal 2 (Week 1): Pt will transfer with mod assist consistently from a variety of surfaces PT Short Term Goal 3 (Week 1): Pt will ambulate 100' with LRAD and min assist PT Short Term Goal 4 (Week 1): Pt will initiate stair negotiation with PT  Skilled Therapeutic Interventions/Progress Updates: Pt presented in bed with c/o pain thoracic/pericapular area. Per husband MD explained due to internal scar tissue forming. Pain meds received prior to therapy. Pain increased with any pushing/pulls per pt. Pt performed supine to sit modA for truncal support and increased time. Sit to stand performed holding onto sterna pillow with minA and performed stand pivot to w/c. Pt required redirection frequently due to pain. Pt transported to day room and performed NuStep L2 x 8 min for endurance no UE with frequent cues for increasing speed and to sustain task. Pt returned to room and performed stand pivot to bed with sit to stands improving throughout session from minA to min guard. Pt returned to supine with minA for LE placement and left with needs met and husband present.      Therapy Documentation Precautions:  Precautions Precautions: Fall, Sternal Restrictions Weight Bearing Restrictions: No Other Position/Activity Restrictions: sternal precautions General:   Vital Signs: Therapy Vitals Temp: 97.8 F (36.6 C) Temp Source: Oral Pulse Rate: 87 Resp: 18 BP: (!) 118/47 Patient Position (if appropriate): Lying Oxygen Therapy SpO2: 98 % O2 Device: Not Delivered   See Function Navigator for Current Functional Status.   Therapy/Group: Individual  Therapy  Amanda [name] 05/01/2017, 4:05 PM

## 2017-05-01 NOTE — Progress Notes (Signed)
Speech Language Pathology Daily Session Note  Patient Details  Name: Amanda [name] MRN: [id number] Date of Birth: 1941-01-28  Today's Date: 05/01/2017 SLP Individual Time: 1300-1345 SLP Individual Time Calculation (min): 45 min  Short Term Goals: Week 1: SLP Short Term Goal 1 (Week 1): Pt will consume dys 2 textures and thin liquids with min verbal cues to clear solids from the oral cavity.   SLP Short Term Goal 2 (Week 1): Pt will sustain her attention to basic tasks for 5 minutes with mod verbal cues for redirection.   SLP Short Term Goal 3 (Week 1): Pt will utilize external aids to orient to place, date, and situation with mod assist verbal cues.  SLP Short Term Goal 4 (Week 1): Pt will complete basic, familiar tasks with mod verbal cues for functional problem solving.   SLP Short Term Goal 5 (Week 1): Pt will identify at least 2 deficits occurring s/p hospitalization with mod question cues.   SLP Short Term Goal 6 (Week 1): Pt will return demonstration of at least 2 safety precautions during functional tasks with mod verbal cues.  Skilled Therapeutic Interventions: Skilled treatment session focused on dysphagia and cognition goals. SLP facilitated session by providing skilled observation of pt consuming graham crackers with peanut butter. Pt with functional oropharyngeal abilities. Recommend upgrade to dysphagia 3 with regular snacks when brought in by family. Pt with flat affect and continued confusion. Pt required Max A to Mod A for orientation information and to complete basic task for self-feeding. Of note, daughter appears to limit pt's functional by performing acts for pt. Education provided. Pt left upright in bed with family present. Continue per current plan of care.      Function:  Eating Eating   Modified Consistency Diet: No Eating Assist Level: Set up assist for;More than reasonable amount of time;Helper checks for pocketed food   Eating Set Up Assist For: Opening  containers       Cognition Comprehension Comprehension assist level: Understands basic 50 - 74% of the time/ requires cueing 25 - 49% of the time  Expression   Expression assist level: Expresses basic 50 - 74% of the time/requires cueing 25 - 49% of the time. Needs to repeat parts of sentences.  Social Interaction Social Interaction assist level: Interacts appropriately 90% of the time - Needs monitoring or encouragement for participation or interaction.  Problem Solving Problem solving assist level: Solves basic 50 - 74% of the time/requires cueing 25 - 49% of the time  Memory Memory assist level: Recognizes or recalls 25 - 49% of the time/requires cueing 50 - 75% of the time    Pain    Therapy/Group: Individual Therapy  Sayde Lish 05/01/2017, 3:24 PM

## 2017-05-01 NOTE — Progress Notes (Addendum)
      [address]       [name] [address]             [phone]            Subjective: Daughter and husband in room. Daughter stated patient has some pain in upper right side of posterior back and occasional incisional pain. Daughter also states sometimes she has trouble taking in a deep breath.  Objective: Vital signs in last 24 hours: Temp:  [97.5 F (36.4 C)] 97.5 F (36.4 C) (10/24 0700) Pulse Rate:  [84-89] 89 (10/24 0854) Resp:  [17-18] 18 (10/24 0700) BP: (112-130)/(58-74) 116/70 (10/24 0854) SpO2:  [98 %] 98 % (10/24 0700) Weight:  [138 lb 14.2 oz (63 kg)] 138 lb 14.2 oz (63 kg) (10/24 0700)  Pre op weight 64.4 kg Current Weight  05/01/17 138 lb 14.2 oz (63 kg)       Intake/Output from previous day: 10/23 0701 - 10/24 0700 In: 1040 [P.O.:1040] Out: -    Physical Exam:  Cardiovascular: RRR, Pulmonary: Clear to auscultation bilaterally Abdomen: Soft, non tender, bowel sounds present. Extremities: No lower extremity edema. Has tenderness of right scapula area. Wound: Clean and dry.  No erythema or signs of infection.  Lab Results: CBC: Recent Labs  04/29/17 0642  WBC 13.1*  HGB 11.8*  HCT 35.6*  PLT 192   BMET:  Recent Labs  04/29/17 0642  NA 135  K 3.3*  CL 104  CO2 25  GLUCOSE 111*  BUN 35*  CREATININE 1.44*  CALCIUM 8.1*    PT/INR:  Lab Results  Component Value Date   INR 3.08 05/01/2017   INR 2.38 04/30/2017   INR 2.18 04/29/2017   ABG:  INR: Will add last result for INR, ABG once components are confirmed Will add last 4 CBG results once components are confirmed  Assessment/Plan:  1. CV - SR. On Atenolol 50 mg daily and Coumadin. INR increased from 2.38 to 3.08. Will likely not need more than 1 mg of Coumadin. 2.  Pulmonary - On room air. 3. ABL anemia- Last H and H stable at 11.8 and 35.6. 4.  GI-speech pathology recommendations noted. 5. Daughter asked if ok to resume Aleve (she stated Dr.  [name] said ok to use once every 2 weeks) for right posterior shoulder pain when goes home. I told if ok with Dr. [name], is ok to use from surgical standpoint but NOT to be taken daily because of CKD (stage III) history.  ZIMMERMAN,DONIELLE MPA-C 05/01/2017,10:57 AM  I have seen and examined the patient and agree with the assessment and plan as outlined.  Appreciate all of the efforts on behalf of Dr. Wynn Banker and the entire CIR team.  Purcell Nails, MD 05/01/2017 1:52 PM

## 2017-05-01 NOTE — Progress Notes (Signed)
[facility] PHYSICAL MEDICINE & REHABILITATION     PROGRESS NOTE  Subjective/Complaints:   No issues overnight except for right parascapular pain, has had this at home before.  No history of trauma.  ROS: Denies CP, nausea, vomiting, diarrhea.  Objective: Vital Signs: Blood pressure 116/70, pulse 89, temperature (!) 97.5 F (36.4 C), temperature source Oral, resp. rate 18, height 5' 6.5" (1.689 m), weight 63 kg (138 lb 14.2 oz), SpO2 98 %. No results found.  Recent Labs  04/29/17 0642  WBC 13.1*  HGB 11.8*  HCT 35.6*  PLT 192    Recent Labs  04/29/17 0642  NA 135  K 3.3*  CL 104  GLUCOSE 111*  BUN 35*  CREATININE 1.44*  CALCIUM 8.1*   CBG (last 3)  No results for input(s): GLUCAP in the last 72 hours.  Wt Readings from Last 3 Encounters:  05/01/17 63 kg (138 lb 14.2 oz)  04/26/17 62.9 kg (138 lb 9.6 oz)  04/15/17 64.4 kg (142 lb)   INR 2.1 Physical Exam:  BP 116/70   Pulse 89   Temp (!) 97.5 F (36.4 C) (Oral)   Resp 18   Ht 5' 6.5" (1.689 m)   Wt 63 kg (138 lb 14.2 oz)   SpO2 98%   BMI 22.08 kg/m  Constitutional: She appears well-nourished. No distress.  HENT: Normocephalic. Atraumatic. Eyes: EOM are normal. No discharge. Cardiovascular: RRR. No JVD. Respiratory: Unlabored. Clear to auscultation  GI: Bowel sounds are normal. She exhibits no distension.  Musculoskeletal: Tenderness palpation over the lower medial scapular border on the right side, no masses palpated Skin. Warm and dry. Intact  Neurological: She is alert.  Motor: LUE/LLE: 4+/5 proximal to distal  RUE/RLE: 4/5 proximal to distal (stable).  Psych: Pleasant and cooperative   Assessment/Plan: 1. Functional deficits secondary to debility which require 3+ hours per day of interdisciplinary therapy in a comprehensive inpatient rehab setting. Physiatrist is providing close team supervision and 24 hour management of active medical problems listed below. Physiatrist and rehab team continue  to assess barriers to discharge/monitor patient progress toward functional and medical goals.  Function:  Bathing Bathing position   Position: Shower  Bathing parts Body parts bathed by patient: Right arm, Left arm, Chest, Abdomen, Front perineal area, Right upper leg, Left upper leg, Left lower leg, Buttocks, Right lower leg Body parts bathed by helper: Back  Bathing assist Assist Level: Touching or steadying assistance(Pt > 75%)      Upper Body Dressing/Undressing Upper body dressing   What is the patient wearing?: Pull over shirt/dress     Pull over shirt/dress - Perfomed by patient: Thread/unthread right sleeve, Thread/unthread left sleeve, Put head through opening, Pull shirt over trunk Pull over shirt/dress - Perfomed by helper: Pull shirt over trunk        Upper body assist Assist Level: Touching or steadying assistance(Pt > 75%)      Lower Body Dressing/Undressing Lower body dressing   What is the patient wearing?: Pants, Underwear, Non-skid slipper socks Underwear - Performed by patient: Thread/unthread right underwear leg, Thread/unthread left underwear leg, Pull underwear up/down   Pants- Performed by patient: Thread/unthread right pants leg, Thread/unthread left pants leg, Pull pants up/down Pants- Performed by helper: Thread/unthread left pants leg, Pull pants up/down Non-skid slipper socks- Performed by patient: Don/doff right sock, Don/doff left sock   Socks - Performed by patient: Don/doff right sock, Don/doff left sock   Shoes - Performed by patient: Don/doff right shoe, Don/doff left  shoe            Lower body assist Assist for lower body dressing: Touching or steadying assistance (Pt > 75%)      Toileting Toileting Toileting activity did not occur: No continent bowel/bladder event Toileting steps completed by patient: Adjust clothing prior to toileting, Performs perineal hygiene, Adjust clothing after toileting Toileting steps completed by helper:  Performs perineal hygiene, Adjust clothing after toileting Toileting Assistive Devices: Grab bar or rail  Toileting assist Assist level: Supervision or verbal cues   Transfers Chair/bed transfer   Chair/bed transfer method: Stand pivot Chair/bed transfer assist level: Moderate assist (Pt 50 - 74%/lift or lower) Chair/bed transfer assistive device: Other (Heart shaped pillow)     Locomotion Ambulation     Max distance: 60 ft Assist level: Touching or steadying assistance (Pt > 75%)   Wheelchair          Cognition Comprehension Comprehension assist level: Understands basic less than 25% of the time/ requires cueing >75% of the time  Expression Expression assist level: Expresses basic 25 - 49% of the time/requires cueing 50 - 75% of the time. Uses single words/gestures.  Social Interaction Social Interaction assist level: Interacts appropriately 25 - 49% of time - Needs frequent redirection.  Problem Solving Problem solving assist level: Solves basic less than 25% of the time - needs direction nearly all the time or does not effectively solve problems and may need a restraint for safety  Memory Memory assist level: Recognizes or recalls less than 25% of the time/requires cueing greater than 75% of the time    Medical Problem List and Plan:  1. Debility secondary to Mitral repair and modified Maze procedure for symptomatic mitral regurgitation 04/16/2017 /multi-medical.No need for sternal prec CIR PT, OT-Team conference today please see physician documentation under team conference tab, met with team face-to-face to discuss problems,progress, and goals. Formulized individual treatment plan based on medical history, underlying problem and comorbidities. 2. DVT Prophylaxis/Anticoagulation: Coumadin per pharmacy protocol   INR 3.0 therapeutic on 10/24 3. Pain Management: Tylenol as needed, heating pad as well as counter irritant cream right parascapular pain 4. Mood: Seroquel 50 mg daily  at bedtime. Cranial CT reviewed, negative for acute changes  5. Neuropsych: This patient is not fully capable of making decisions on his own behalf.  6. Skin/Wound Care: Routine skin checks  7. Fluids/Electrolytes/Nutrition: Routine I&Os, hypokalemia. We'll supplement 8. Dysphagia. Dysphagia 2 thin liquids. Follow-up speech therapy  9. CKD stage III. Followed in the past by Dr. Olegario Shearer 10/22 BMP Latest Ref Rng & Units 04/29/2017 04/26/2017 04/25/2017  Glucose 65 - 99 mg/dL 111(H) 153(H) 121(H)  BUN 6 - 20 mg/dL 35(H) 62(H) 77(H)  Creatinine 0.44 - 1.00 mg/dL 1.44(H) 1.71(H) 1.74(H)  BUN/Creat Ratio 12 - 28 - - -  Sodium 135 - 145 mmol/L 135 149(H) 151(H)  Potassium 3.5 - 5.1 mmol/L 3.3(L) 3.8 3.0(L)  Chloride 101 - 111 mmol/L 104 115(H) 109  CO2 22 - 32 mmol/L 25 28 33(H)  Calcium 8.9 - 10.3 mg/dL 8.1(L) 8.5(L) 9.0   10. Hypertension. Tenormin 50 mg daily   Controlled 10/24, monitor for hypotension Vitals:   05/01/17 0700 05/01/17 0854  BP: (!) 112/58 116/70  Pulse: 87 89  Resp: 18   Temp: (!) 97.5 F (36.4 C)   SpO2: 98%    11. Leukocytosis. Improving.    down to 13K on 04/29/2017, monitor 12. Hypothyroidism. Synthroid  13. SOB: Improved  Desats briefly in  PT off O2 needs cues for deep breathing 14. Oral thrush   Diflucan ordered 10/20  Nystatin swish and swallow 15.  Hypo kalemia will supplement recheck bmet LOS (Days) 5 A FACE TO FACE EVALUATION WAS PERFORMED  Charlett Blake 05/01/2017 9:37 AM

## 2017-05-01 NOTE — Progress Notes (Signed)
ANTICOAGULATION CONSULT NOTE - Follow Up Consult  Pharmacy Consult for warfarin Indication: atrial fibrillation  Allergies  Allergen Reactions  . Pepcid [Famotidine] Swelling and Other (See Comments)    "Throat Swelling"   . Allopurinol Other (See Comments)    Caused headaches  . Amlodipine Other (See Comments)    PEDAL EDEMA   . Colchicine Other (See Comments)    UNSPECIFIED REACTION   . Contrast Media [Iodinated Diagnostic Agents] Hives    IVP dye per patient  . Prilosec [Omeprazole] Nausea Only  . Atorvastatin Other (See Comments)    UNSPECIFIED REACTION   . Cephalexin Itching and Rash  . Ciprofloxacin Itching and Rash  . Erythromycin Itching and Rash  . Sulfonamide Derivatives Itching and Rash  . Tetracycline Itching and Rash    Patient Measurements: Height: 5' 6.5" (168.9 cm) Weight: 138 lb 14.2 oz (63 kg) IBW/kg (Calculated) : 60.45  Vital Signs: Temp: 97.5 F (36.4 C) (10/24 0700) Temp Source: Oral (10/24 0700) BP: 116/70 (10/24 0854) Pulse Rate: 89 (10/24 0854)  Labs:  Recent Labs  04/29/17 0642 04/30/17 1200 05/01/17 0626  HGB 11.8*  --   --   HCT 35.6*  --   --   PLT 192  --   --   LABPROT 24.1* 25.8* 31.5*  INR 2.18 2.38 3.08  CREATININE 1.44*  --   --     Estimated Creatinine Clearance: 30.8 mL/min (A) (by C-G formula based on SCr of 1.44 mg/dL (H)). Assessment: 11 YOF with h/o Afib on warfarin s/p mitral valve repair and clipping of atrial appendage. INR 2.38 > 3.08, sl supratherapeutic, hgb stable, pltc 192K, improved.  S/p fluconazole 150mg  x1 10/20   Goal of Therapy:  INR 2-3 Monitor platelets by anticoagulation protocol: Yes   Plan:  -Warfarin 0.5 mg tonight -Monitor daily PT/INR   Bayard Hugger, PharmD, BCPS  Clinical Pharmacist  Pager: [phone]   05/01/2017 1:53 PM

## 2017-05-02 ENCOUNTER — Inpatient Hospital Stay (HOSPITAL_COMMUNITY): Payer: Medicare Other | Admitting: Speech Pathology

## 2017-05-02 ENCOUNTER — Inpatient Hospital Stay (HOSPITAL_COMMUNITY): Payer: Medicare Other | Admitting: Physical Therapy

## 2017-05-02 ENCOUNTER — Inpatient Hospital Stay (HOSPITAL_COMMUNITY): Payer: Medicare Other | Admitting: Occupational Therapy

## 2017-05-02 LAB — PROTIME-INR
INR: 2.98
PROTHROMBIN TIME: 30.7 s — AB (ref 11.4–15.2)

## 2017-05-02 MED ORDER — WARFARIN 0.5 MG HALF TABLET
0.5000 mg | ORAL_TABLET | Freq: Once | ORAL | Status: AC
  Administered 2017-05-02: 0.5 mg via ORAL
  Filled 2017-05-02: qty 1

## 2017-05-02 MED ORDER — QUETIAPINE FUMARATE 50 MG PO TABS: 50.0000 mg | ORAL_TABLET | Freq: Every day | ORAL | 0 refills | Status: DC

## 2017-05-02 MED ORDER — ATENOLOL 50 MG PO TABS: 50.0000 mg | ORAL_TABLET | Freq: Every day | ORAL | 11 refills | Status: DC

## 2017-05-02 MED ORDER — PANTOPRAZOLE SODIUM 40 MG PO TBEC: 40.0000 mg | DELAYED_RELEASE_TABLET | Freq: Every day | ORAL | 0 refills | Status: DC

## 2017-05-02 MED ORDER — LEVOTHYROXINE SODIUM 75 MCG PO TABS
75.0000 ug | ORAL_TABLET | ORAL | 0 refills | Status: DC
Start: 2017-05-02 — End: 2018-03-11

## 2017-05-02 NOTE — Progress Notes (Signed)
Physical Therapy Session Note  Patient Details  Name: Amanda [name] MRN: [id number] Date of Birth: 10/14/1940  Today's Date: 05/02/2017 PT Individual Time: 1100-1200 PT Individual Time Calculation (min): 60 min   Short Term Goals: Week 1:  PT Short Term Goal 1 (Week 1): Pt will recall 1/3 sternal precautions with mod cues PT Short Term Goal 2 (Week 1): Pt will transfer with mod assist consistently from a variety of surfaces PT Short Term Goal 3 (Week 1): Pt will ambulate 100' with LRAD and min assist PT Short Term Goal 4 (Week 1): Pt will initiate stair negotiation with PT  Skilled Therapeutic Interventions/Progress Updates:   Pt received supine in bed and agreeable to PT. Supine>sit transfer with min assist and moderate cues for safety.  PT treatment focused on Family education and training. WC mobility xc 125f with supervision assist from PT and BUE propulsion. Gait Training instructed by PT [id number] supervision assist from PT with additional gait training with daughter provided supervision assist. Min cues for safety awareness for husband and daughter with turns and AD mangement. Car transfers with supervision assist and min cues for Ad management. Stair negotiation 2x4 with no UE support and min assist from PT and from daughter. Min cues for proper gaurding and cues for step to gait pattern to improve safety.  PT instructed pt in Modified [place] level A exercises program with BUE support on RW. Hand out provided for HEP with instruction for proper frequency.   [facility] to [facility] and all needs met.           Therapy Documentation Precautions:  Precautions Precautions: Fall, Sternal Restrictions Weight Bearing Restrictions: No Other Position/Activity Restrictions: sternal precautions    Vital Signs: Therapy Vitals Temp: 98.1 F (36.7 C) Temp Source: Oral Pulse Rate: 79 Resp: 18 BP: (!) 122/57 Patient Position  (if appropriate): Lying Oxygen Therapy SpO2: 98 % O2 Device: Not Delivered Pain: 0/10 at rest  See Function Navigator for Current Functional Status.   Therapy/Group: Individual Therapy  AuLorie Phenix0/25/2018, 5:37 PM

## 2017-05-02 NOTE — Progress Notes (Signed)
Occupational Therapy Session Note  Patient Details  Name: Amanda [name] MRN: [id number] Date of Birth: 1940-10-25  Today's Date: 05/02/2017 OT Individual Time: 0930-1030 OT Individual Time Calculation (min): 60 min    Short Term Goals: Week 1:  OT Short Term Goal 1 (Week 1): Pt will sit to stand with MIN A in prep for clothing management. OT Short Term Goal 2 (Week 1): Pt will stand pivot transfer to toilet wiht MIN A OT Short Term Goal 3 (Week 1): Pt will stand to groom 2/2 grooming items to improve functional endurance with touching A for balance. OT Short Term Goal 4 (Week 1): Pt will thread BLE into pants with supervision and no more than 1 Vc for clothing orientation  Skilled Therapeutic Interventions/Progress Updates: patient completed skilled OT session today to increase independence with care including focus on balance and cognitive carryover and safety awareness.      Bed mobility moderate cues for technique after she stated, "I am too heavy to get myself up." Sit to stand in prep for clothing mgmt after peri cleansing in w/c at sink with Superivsion this morning Transfers were close S today Patient sat in w/c to complete grooming, including face, oral care, hands and makeup , with good cognitive skills, intiation, task completion, even with divided attention after her husband came in and spoke regarding activities other than her therapy task at hand  Patient was left seated in w/c beside her bed with call bell in place and the mobile sitter engaged     Therapy Documentation Precautions: fall risk  Pain:denied    Therapy/Group: Individual Therapy  [name] 05/02/2017, 1:27 PM

## 2017-05-02 NOTE — Progress Notes (Signed)
PHYSICAL MEDICINE & REHABILITATION     PROGRESS NOTE  Subjective/Complaints:   Pt alert. Working with therapy. Had a good night. Pleased with progres   ROS: pt denies nausea, vomiting, diarrhea, cough, shortness of breath or chest pain    Objective: Vital Signs: Blood pressure (!) 118/58, pulse 88, temperature 97.8 F (36.6 C), temperature source Oral, resp. rate 18, height 5' 6.5" (1.689 m), weight 64.5 kg (142 lb 3.2 oz), SpO2 100 %. No results found. No results for input(s): WBC, HGB, HCT, PLT in the last 72 hours. No results for input(s): NA, K, CL, GLUCOSE, BUN, CREATININE, CALCIUM in the last 72 hours.  Invalid input(s): CO CBG (last 3)  No results for input(s): GLUCAP in the last 72 hours.  Wt Readings from Last 3 Encounters:  05/02/17 64.5 kg (142 lb 3.2 oz)  04/26/17 62.9 kg (138 lb 9.6 oz)  04/15/17 64.4 kg (142 lb)   INR 2.1 Physical Exam:  BP (!) 118/58 (BP Location: Right Arm)   Pulse 88   Temp 97.8 F (36.6 C) (Oral)   Resp 18   Ht 5' 6.5" (1.689 m)   Wt 64.5 kg (142 lb 3.2 oz)   SpO2 100%   BMI 22.61 kg/m  Constitutional: She appears well-nourished. No distress.  HENT: Normocephalic. Atraumatic. Eyes: EOM are normal. No discharge. Cardiovascular: RRR without murmur. No JVD  Respiratory: CTA Bilaterally without wheezes or rales. Normal effort  GI: Bowel sounds are normal. She exhibits no distension.  Musculoskeletal: Tenderness palpation over the lower medial scapular border on the right side, no masses palpated Skin. Warm and dry. Intact  Neurological: She is alert.  Motor: LUE/LLE: 4+/5 proximal to distal  RUE/RLE: 4/5 proximal to distal (stable).  Psych: Pleasant and cooperative   Assessment/Plan: 1. Functional deficits secondary to debility which require 3+ hours per day of interdisciplinary therapy in a comprehensive inpatient rehab setting. Physiatrist is providing close team supervision and 24 hour management of active medical  problems listed below. Physiatrist and rehab team continue to assess barriers to discharge/monitor patient progress toward functional and medical goals.  Function:  Bathing Bathing position   Position: Shower  Bathing parts Body parts bathed by patient: Right arm, Left arm, Chest, Abdomen, Front perineal area, Right upper leg, Left upper leg, Left lower leg, Buttocks, Right lower leg Body parts bathed by helper: Back  Bathing assist Assist Level: Touching or steadying assistance(Pt > 75%)      Upper Body Dressing/Undressing Upper body dressing   What is the patient wearing?: Pull over shirt/dress     Pull over shirt/dress - Perfomed by patient: Thread/unthread right sleeve, Thread/unthread left sleeve, Put head through opening, Pull shirt over trunk Pull over shirt/dress - Perfomed by helper: Pull shirt over trunk        Upper body assist Assist Level: Touching or steadying assistance(Pt > 75%)      Lower Body Dressing/Undressing Lower body dressing   What is the patient wearing?: Pants, Underwear, Non-skid slipper socks Underwear - Performed by patient: Thread/unthread right underwear leg, Thread/unthread left underwear leg, Pull underwear up/down   Pants- Performed by patient: Thread/unthread right pants leg, Thread/unthread left pants leg, Pull pants up/down Pants- Performed by helper: Thread/unthread left pants leg, Pull pants up/down Non-skid slipper socks- Performed by patient: Don/doff right sock, Don/doff left sock   Socks - Performed by patient: Don/doff right sock, Don/doff left sock   Shoes - Performed by patient: Don/doff right shoe, Don/doff left shoe  Lower body assist Assist for lower body dressing: Touching or steadying assistance (Pt > 75%)      Toileting Toileting Toileting activity did not occur: No continent bowel/bladder event Toileting steps completed by patient: Adjust clothing prior to toileting, Performs perineal hygiene, Adjust  clothing after toileting Toileting steps completed by helper: Performs perineal hygiene, Adjust clothing after toileting Toileting Assistive Devices: Grab bar or rail  Toileting assist Assist level: Supervision or verbal cues   Transfers Chair/bed transfer   Chair/bed transfer method: Stand pivot Chair/bed transfer assist level: Moderate assist (Pt 50 - 74%/lift or lower) Chair/bed transfer assistive device: Other (Heart shaped pillow)     Locomotion Ambulation     Max distance: 60 ft Assist level: Touching or steadying assistance (Pt > 75%)   Wheelchair          Cognition Comprehension Comprehension assist level: Understands basic 50 - 74% of the time/ requires cueing 25 - 49% of the time  Expression Expression assist level: Expresses basic 50 - 74% of the time/requires cueing 25 - 49% of the time. Needs to repeat parts of sentences.  Social Interaction Social Interaction assist level: Interacts appropriately 90% of the time - Needs monitoring or encouragement for participation or interaction.  Problem Solving Problem solving assist level: Solves basic 50 - 74% of the time/requires cueing 25 - 49% of the time  Memory Memory assist level: Recognizes or recalls 25 - 49% of the time/requires cueing 50 - 75% of the time    Medical Problem List and Plan:  1. Debility secondary to Mitral repair and modified Maze procedure for symptomatic mitral regurgitation 04/16/2017 /multi-medical.No need for sternal prec  -continue CIR PT, OT 2. DVT Prophylaxis/Anticoagulation: Coumadin per pharmacy protocol   INR 2.98 therapeutic on 10/25 3. Pain Management: Tylenol as needed, heating pad as well as counter irritant cream right parascapular pain 4. Mood: Seroquel 50 mg daily at bedtime. Cranial CT reviewed, negative for acute changes  5. Neuropsych: This patient is not fully capable of making decisions on his own behalf.  6. Skin/Wound Care: Routine skin checks  7.  Fluids/Electrolytes/Nutrition: Routine I&Os, hypokalemia. We'll supplement 8. Dysphagia. Dysphagia 2 thin liquids. Follow-up speech therapy  9. CKD stage III. Followed in the past by Dr. Aurelio Jeweterding   Stable 10/22 BMP Latest Ref Rng & Units 04/29/2017 04/26/2017 04/25/2017  Glucose 65 - 99 mg/dL 841(L111(H) 244(W153(H) 102(V121(H)  BUN 6 - 20 mg/dL 25(D35(H) 66(Y62(H) 40(H77(H)  Creatinine 0.44 - 1.00 mg/dL 4.74(Q1.44(H) 5.95(G1.71(H) 3.87(F1.74(H)  BUN/Creat Ratio 12 - 28 - - -  Sodium 135 - 145 mmol/L 135 149(H) 151(H)  Potassium 3.5 - 5.1 mmol/L 3.3(L) 3.8 3.0(L)  Chloride 101 - 111 mmol/L 104 115(H) 109  CO2 22 - 32 mmol/L 25 28 33(H)  Calcium 8.9 - 10.3 mg/dL 8.1(L) 8.5(L) 9.0   10. Hypertension. Tenormin 50 mg daily   Controlled 10/25, monitor for hypotension Vitals:   05/01/17 1408 05/02/17 0528  BP: (!) 118/47 (!) 118/58  Pulse: 87 88  Resp: 18 18  Temp: 97.8 F (36.6 C) 97.8 F (36.6 C)  SpO2: 98% 100%   11. Leukocytosis. Improving.    down to 13K on 04/29/2017, monitor 12. Hypothyroidism. Synthroid  13. SOB: Improved  Desats briefly in PT off O2 needs cues for deep breathing 14. Oral thrush   Diflucan ordered 10/20  Nystatin swish and swallow 15.  Hypo kalemia: supplementing   LOS (Days) 6 A FACE TO FACE EVALUATION WAS PERFORMED  Megham Dwyer T 05/02/2017  8:22 AM

## 2017-05-02 NOTE — Progress Notes (Signed)
ANTICOAGULATION CONSULT NOTE - Follow Up Consult  Pharmacy Consult for warfarin Indication: atrial fibrillation  Allergies  Allergen Reactions  . Pepcid [Famotidine] Swelling and Other (See Comments)    "Throat Swelling"   . Allopurinol Other (See Comments)    Caused headaches  . Amlodipine Other (See Comments)    PEDAL EDEMA   . Colchicine Other (See Comments)    UNSPECIFIED REACTION   . Contrast Media [Iodinated Diagnostic Agents] Hives    IVP dye per patient  . Prilosec [Omeprazole] Nausea Only  . Atorvastatin Other (See Comments)    UNSPECIFIED REACTION   . Cephalexin Itching and Rash  . Ciprofloxacin Itching and Rash  . Erythromycin Itching and Rash  . Sulfonamide Derivatives Itching and Rash  . Tetracycline Itching and Rash    Patient Measurements: Height: 5' 6.5" (168.9 cm) Weight: 142 lb 3.2 oz (64.5 kg) IBW/kg (Calculated) : 60.45  Vital Signs: Temp: 97.8 F (36.6 C) (10/25 0528) Temp Source: Oral (10/25 0528) BP: 118/58 (10/25 0528) Pulse Rate: 88 (10/25 0528)  Labs:  Recent Labs  04/30/17 1200 05/01/17 0626 05/02/17 0651  LABPROT 25.8* 31.5* 30.7*  INR 2.38 3.08 2.98    Estimated Creatinine Clearance: 30.8 mL/min (A) (by C-G formula based on SCr of 1.44 mg/dL (H)). Assessment: 12 YOF with h/o Afib on warfarin s/p mitral valve repair and clipping of atrial appendage. INR 3.08 > 2.98, after lower dose yesterday, hgb stable, pltc 192K, improved. Likely discharge soon.   Goal of Therapy:  INR 2-3 Monitor platelets by anticoagulation protocol: Yes   Plan:  -Repeat Warfarin 0.5 mg tonight -Recommend coumadin 1mg  daily at discharge -Monitor daily PT/INR   Bayard Hugger, PharmD, BCPS  Clinical Pharmacist  Pager: [phone]   05/02/2017 12:23 PM

## 2017-05-02 NOTE — Discharge Summary (Signed)
Discharge summary job [phone]

## 2017-05-02 NOTE — Progress Notes (Addendum)
Speech Language Pathology Daily Session Note  Patient Details  Name: Amanda [name] MRN: [id number] Date of Birth: 12-04-1940  Today's Date: 05/02/2017   Skilled treatment session #1 SLP Individual Time: 1749-4496 SLP Individual Time Calculation (min): 45 min   Skilled treatment session #2 SLP Individual Time: 1445-1515 SLP Individual Time Calculation (min): 30 min  Short Term Goals: Week 1: SLP Short Term Goal 1 (Week 1): Pt will consume dys 2 textures and thin liquids with min verbal cues to clear solids from the oral cavity.   SLP Short Term Goal 2 (Week 1): Pt will sustain her attention to basic tasks for 5 minutes with mod verbal cues for redirection.   SLP Short Term Goal 3 (Week 1): Pt will utilize external aids to orient to place, date, and situation with mod assist verbal cues.  SLP Short Term Goal 4 (Week 1): Pt will complete basic, familiar tasks with mod verbal cues for functional problem solving.   SLP Short Term Goal 5 (Week 1): Pt will identify at least 2 deficits occurring s/p hospitalization with mod question cues.   SLP Short Term Goal 6 (Week 1): Pt will return demonstration of at least 2 safety precautions during functional tasks with mod verbal cues.  Skilled Therapeutic Interventions:   Skilled treatment session #1  focused on dysphagia and cognition goals. SLP facilitated session by providing skilled observation of pt consuming dysphagia 3 breakfast with thin liquids and administration of medicine whole with thin water via straw. Pt with 1 cough when consuming thin liquids via straw but no further overt s/s of aspiration. Pt continues to require Min A cues for all cognitive function including applying condiments to food. For example pt was going to put [place]te into coffee. Pt pleasantly confused but easily redirected to task. Required Mod A to Min A cues to continue eating to consume ~ 50% of meal. Pt was left upright in bed, bed alarm on and all needs within reach.  Continue per current plan of care.   Skilled treatment session #2 focused on caregiver education regarding foods and aspiration risk with certain foods. List of food items reviewed and discussed as well as aspiration precautions such as turning TV off, being alert, at table and not talking while eating. Pt, her husband and her daughter voiced understanding and all questions were answered to satisfaction.      Function:  Eating Eating   Modified Consistency Diet: Yes Eating Assist Level: Set up assist for;More than reasonable amount of time;Supervision or verbal cues   Eating Set Up Assist For: Opening containers       Cognition Comprehension Comprehension assist level: Understands basic 50 - 74% of the time/ requires cueing 25 - 49% of the time;Understands basic 75 - 89% of the time/ requires cueing 10 - 24% of the time  Expression   Expression assist level: Expresses basic 50 - 74% of the time/requires cueing 25 - 49% of the time. Needs to repeat parts of sentences.  Social Interaction Social Interaction assist level: Interacts appropriately 90% of the time - Needs monitoring or encouragement for participation or interaction.  Problem Solving Problem solving assist level: Solves basic 50 - 74% of the time/requires cueing 25 - 49% of the time;Solves basic 75 - 89% of the time/requires cueing 10 - 24% of the time  Memory Memory assist level: Recognizes or recalls 25 - 49% of the time/requires cueing 50 - 75% of the time    Pain    Therapy/Group:  Individual Therapy  Danette Weinfeld 05/02/2017, 9:12 AM

## 2017-05-02 NOTE — Discharge Summary (Signed)
Amanda [name], Amanda [name] NO.:  [id number]  MEDICAL RECORD NO.:  [id number]  LOCATION:  [id number]                        FACILITY:  [name]  PHYSICIAN:  Amanda [name], M.D.DATE OF BIRTH:  07-12-1940  DATE OF ADMISSION:  04/26/2017 DATE OF DISCHARGE:  05/04/2017                              DISCHARGE SUMMARY   DISCHARGE DIAGNOSES: 1. Debilitation secondary to mitral valve repair with Maze procedure     for symptomatic mitral regurgitation on April 16, 2017. 2. Chronic Coumadin therapy. 3. Pain management. 4. Dysphagia. 5. Chronic kidney disease stage 3. 6. Hypertension. 7. Hypothyroidism. 8. Hypokalemia, resolved.  HISTORY OF PRESENT ILLNESS:  This is a 76 year old right-handed female with history of mitral valve prolapse and regurgitation, CVA, hypertension as well as CKD stage 3 and heart block with pacemaker, PAF, on long-term anticoagulation.  Lives with spouse, independent prior to admission.  Presented on April 15, 2017, for evaluation and treatment of mitral regurgitation.  Recent echocardiogram with normal left ventricular systolic function, mild to moderate mitral regurgitation. Followup echo showed significant worsening in severity of mitral regurgitation.  Underwent Maze procedure on April 16, 2017, per Dr. Cornelius [name].  Postoperatively, chest tube was placed, later removed. Monitoring of volumes, increase of 150-250 mL of output.  Underwent re- exploration of right mini thoracotomy for any bleeding with no findings of reaccumulated blood.  Intermittent bouts of confusion, restlessness. Cranial CT scan negative for acute processes.  Initially maintained on subcutaneous Lovenox for DVT prophylaxis, transition to Coumadin therapy.  Her diet was slowly advanced to a dysphagia #2 thin liquid. She was admitted for comprehensive rehab program.  PAST MEDICAL HISTORY:  See discharge diagnoses.  SOCIAL HISTORY:  She lives with spouse, independent driving prior  to admission.  FUNCTIONAL STATUS UPON ADMISSION TO REHAB SERVICES:  Minimal assist 175 feet with a walker, minimal assist sit to stand, min to mod assist activities of daily living with some decreased safety awareness.  PHYSICAL EXAMINATION:  VITAL SIGNS:  Blood pressure 116/56, pulse 70, temperature 98, and respirations 18. GENERAL:  This was an alert female, in no acute distress.  Easily distracted.  Some word-finding deficits. HEENT:  EOMs intact. NECK:  Supple and nontender.  No JVD. CARDIAC:  Rate controlled. ABDOMEN:  Soft and nontender.  Good bowel sounds. LUNGS:  Decreased breath sounds.  Clear to auscultation.  REHABILITATION HOSPITAL COURSE:  The patient was admitted to Inpatient Rehab Services with therapies initiated on a 3-hour daily basis consisting of physical therapy, occupational therapy, speech therapy, and rehabilitation nursing.  The following issues were addressed during the patient's rehabilitation stay.  Pertaining to [name]'s debilitation related to Maze procedure, surgical site healing nicely, no need for sternal precautions, she would follow up with Cardiothoracic Surgery.  She remained on chronic Coumadin therapy.  Latest INR of 2.98. She would follow up with [name] for ongoing monitoring of Coumadin therapy.  A home health nurse had been arranged.  CKD stage 3, she remained stable.  She had been seen by Dr. Darrick [name] in the past. Blood pressures controlled.  No orthostatic changes.  She continued on Tenormin.  She was resting better at night.  Insight and awareness improving.  She was still continued on low-dose Seroquel.  Synthroid for hypothyroidism.  The patient received weekly collaborative interdisciplinary team conferences to discuss estimated length of stay, family teaching, any barriers to her discharge.  She performed supine to sit moderate assist, sit to stand minimal assist, mobility vary depending on level of  participation, ambulating short household distances with assistance working with energy conservation techniques. She can ambulate up to 90 feet, needing some assist for lower body dressing and grooming.  She continued on a dysphagia #3 thin liquid diet.  Her overall intake did continue to improve.  Again working with safety awareness.  Also discussed at length with family the need to continue to monitor for her safety.  She could express her needs and good social interaction.  DISCHARGE MEDICATIONS: 1. Tenormin 50 mg p.o. daily. 2. Synthroid 75 mcg p.o. daily. 3. Protonix 40 mg p.o. daily. 4. Potassium chloride 10 mEq p.o. daily. 5. Seroquel 50 mg p.o. at bedtime. 6. Coumadin latest dose of 0.5 mg, adjusted accordingly for INR of 2.0     to 3.0.  DIET:  Her diet was a mechanical soft.  FOLLOWUP:  She would follow up with [facility]Andrew [name]; Dr. Tressie Stalkerlarence [name], call for appointment; Dr. Gilman SchmidtGregory [name], Cardiology Services; Dr. Fayrene FearingJames [name] as needed; Dr. Gweneth DimitriWendy [name], Medical Management.  A home health nurse had been arranged to check INR on May 08, 2017; results to [name], [facility], [phone], fax [phone].     Amanda Dollaraniel Eytan [name], P.A.   ______________________________ Amanda ColaceAndrew E. [name], M.D.    DA/MEDQ  D:  05/02/2017  T:  05/02/2017  Job:  [id number]  cc:   Amanda CanningGregg W. Ladona Ridgelaylor, MD Dr. Henderson BaltimoreGregory [name] Clarence H. Amanda [name], M.D. Pam DrownWendy W. [name], M.D.

## 2017-05-03 ENCOUNTER — Inpatient Hospital Stay (HOSPITAL_COMMUNITY): Payer: Medicare Other | Admitting: Physical Therapy

## 2017-05-03 ENCOUNTER — Inpatient Hospital Stay (HOSPITAL_COMMUNITY): Payer: Medicare Other

## 2017-05-03 ENCOUNTER — Inpatient Hospital Stay (HOSPITAL_COMMUNITY): Payer: Medicare Other | Admitting: Speech Pathology

## 2017-05-03 LAB — PROTIME-INR
INR: 2.61
PROTHROMBIN TIME: 27.7 s — AB (ref 11.4–15.2)

## 2017-05-03 MED ORDER — WARFARIN SODIUM 1 MG PO TABS
1.0000 mg | ORAL_TABLET | Freq: Every day | ORAL | Status: DC
  Administered 2017-05-03: 1 mg via ORAL
  Filled 2017-05-03: qty 1

## 2017-05-03 MED ORDER — WARFARIN SODIUM 1 MG PO TABS: 1.0000 mg | ORAL_TABLET | Freq: Every day | ORAL | 11 refills | Status: DC

## 2017-05-03 NOTE — Progress Notes (Signed)
Occupational Therapy Discharge Summary  Patient Details  Name: Amanda [name] MRN: [id number] Date of Birth: [birth year]    Patient has met 62 of 10 long term goals due to improved activity tolerance, improved balance, postural control, improved attention, improved awareness and improved coordination.  Patient to discharge at overall Supervision level.  Patient's care partner is independent to provide the necessary cognitive assistance at discharge.    Reasons goals not met: n/a  Recommendation:  Patient will benefit from ongoing skilled OT services in home health setting to continue to advance functional skills in the area of BADL and iADL. Continue to work on endurance, strength, functional problems solving, attention, balance and safety awareness.  Equipment: shower chair  Reasons for discharge: treatment goals met and discharge from hospital  Patient/family agrees with progress made and goals achieved: Yes  OT Discharge Precautions/Restrictions  Precautions Precautions: None Restrictions Weight Bearing Restrictions: No General   Vital Signs Therapy Vitals Temp: 98.4 F (36.9 C) Temp Source: Oral Pulse Rate: 89 Resp: 19 BP: (!) 129/54 (Nurse notified) Patient Position (if appropriate): Lying Oxygen Therapy SpO2: 99 % O2 Device: Not Delivered Pain Pain Assessment Pain Assessment: No/denies pain ADL   Vision Baseline Vision/History: No visual deficits Patient Visual Report: No change from baseline Vision Assessment?: No apparent visual deficits Perception  Perception: Within Functional Limits Praxis Praxis: Intact Cognition Overall Cognitive Status: Impaired/Different from baseline Arousal/Alertness: Awake/alert Orientation Level: Oriented to person;Oriented to place;Disoriented to time;Disoriented to situation Attention: Sustained Sustained Attention: Appears intact Sustained Attention Impairment: Verbal basic;Functional basic Safety/Judgment:  Impaired Sensation Sensation Light Touch: Appears Intact Proprioception: Appears Intact Coordination Gross Motor Movements are Fluid and Coordinated: Yes Fine Motor Movements are Fluid and Coordinated: Yes Motor  Motor Motor: Other (comment) Motor - Skilled Clinical Observations: generalized weakness,  Motor - Discharge Observations: improved strength and endurance from Eval Mobility  Transfers Sit to Stand: 5: Supervision Sit to Stand Details (indicate cue type and reason): with RW Stand to Sit: 5: Supervision Stand to Sit Details: with RW  Trunk/Postural Assessment  Cervical Assessment Cervical Assessment: Exceptions to [facility] (head forward) Thoracic Assessment Thoracic Assessment: Exceptions to [facility] (rounded shoulders) Lumbar Assessment Lumbar Assessment: Exceptions to [facility] (posterior pelvic tilt) Postural Control Postural Control: Within Functional Limits  Balance Balance Balance Assessed: Yes Dynamic Sitting Balance Dynamic Sitting - Level of Assistance: 5: Stand by assistance Static Standing Balance Static Standing - Level of Assistance: 5: Stand by assistance Dynamic Standing Balance Dynamic Standing - Balance Support: Right upper extremity supported Dynamic Standing - Level of Assistance: 5: Stand by assistance Extremity/Trunk Assessment RUE Assessment RUE Assessment: Exceptions to [facility] (generalized weakness) LUE Assessment LUE Assessment: Exceptions to [facility] (generalized weakness)   See Function Navigator for Current Functional Status.  Lowella Dell Roslynn Holte 05/03/2017, 4:26 PM

## 2017-05-03 NOTE — Progress Notes (Signed)
[facility]ICAL [facility]ICINE & REHABILITATION     PROGRESS NOTE  Subjective/Complaints:   No new issues. Out with therapy  ROS: pt denies nausea, vomiting, diarrhea, cough, shortness of breath or chest pain    Objective: Vital Signs: Blood pressure 135/76, pulse 94, temperature (!) 97.5 F (36.4 C), temperature source Oral, resp. rate 20, height 5' 6.5" (1.689 m), weight 65.2 kg (143 lb 11.2 oz), SpO2 97 %. No results found. No results for input(s): WBC, HGB, HCT, PLT in the last 72 hours. No results for input(s): NA, K, CL, GLUCOSE, BUN, CREATININE, CALCIUM in the last 72 hours.  Invalid input(s): CO CBG (last 3)  No results for input(s): GLUCAP in the last 72 hours.  Wt Readings from Last 3 Encounters:  05/03/17 65.2 kg (143 lb 11.2 oz)  04/26/17 62.9 kg (138 lb 9.6 oz)  04/15/17 64.4 kg (142 lb)   INR 2.1 Physical Exam:  BP 135/76 (BP Location: Right Arm)   Pulse 94   Temp (!) 97.5 F (36.4 C) (Oral)   Resp 20   Ht 5' 6.5" (1.689 m)   Wt 65.2 kg (143 lb 11.2 oz)   SpO2 97%   BMI 22.85 kg/m  Constitutional: She appears well-nourished. No distress.  HENT: Normocephalic. Atraumatic. Eyes: EOM are normal. No discharge. Cardiovascular: RRR without murmur. No JVD   Respiratory: CTA Bilaterally without wheezes or rales. Normal effort  GI: Bowel sounds are normal. She exhibits no distension.  Musculoskeletal: Tenderness palpation over the lower medial scapular border on the right side, no masses palpated Skin. Warm and dry. Intact  Neurological: She is alert.  Motor: LUE/LLE: 4+/5 proximal to distal  RUE/RLE: 4/5 proximal to distal (stable).  Psych: Pleasant and cooperative   Assessment/Plan: 1. Functional deficits secondary to debility which require 3+ hours per day of interdisciplinary therapy in a comprehensive inpatient rehab setting. Physiatrist is providing close team supervision and 24 hour management of active medical problems listed below. Physiatrist and  rehab team continue to assess barriers to discharge/monitor patient progress toward functional and medical goals.  Function:  Bathing Bathing position   Position: Shower  Bathing parts Body parts bathed by patient: Right arm, Left arm, Chest, Abdomen, Front perineal area, Right upper leg, Left upper leg, Left lower leg, Buttocks, Right lower leg Body parts bathed by helper: Back  Bathing assist Assist Level: Touching or steadying assistance(Pt > 75%)      Upper Body Dressing/Undressing Upper body dressing   What is the patient wearing?: Pull over shirt/dress     Pull over shirt/dress - Perfomed by patient: Thread/unthread right sleeve, Thread/unthread left sleeve, Put head through opening, Pull shirt over trunk Pull over shirt/dress - Perfomed by helper: Pull shirt over trunk        Upper body assist Assist Level: Touching or steadying assistance(Pt > 75%)      Lower Body Dressing/Undressing Lower body dressing   What is the patient wearing?: Pants, Underwear, Non-skid slipper socks Underwear - Performed by patient: Thread/unthread right underwear leg, Thread/unthread left underwear leg, Pull underwear up/down   Pants- Performed by patient: Thread/unthread right pants leg, Thread/unthread left pants leg, Pull pants up/down Pants- Performed by helper: Thread/unthread left pants leg, Pull pants up/down Non-skid slipper socks- Performed by patient: Don/doff right sock, Don/doff left sock   Socks - Performed by patient: Don/doff right sock, Don/doff left sock   Shoes - Performed by patient: Don/doff right shoe, Don/doff left shoe  Lower body assist Assist for lower body dressing: Touching or steadying assistance (Pt > 75%)      Toileting Toileting Toileting activity did not occur: No continent bowel/bladder event Toileting steps completed by patient: Adjust clothing prior to toileting, Performs perineal hygiene, Adjust clothing after toileting Toileting steps  completed by helper: Performs perineal hygiene, Adjust clothing after toileting Toileting Assistive Devices: Grab bar or rail  Toileting assist Assist level: Supervision or verbal cues   Transfers Chair/bed transfer   Chair/bed transfer method: Stand pivot Chair/bed transfer assist level: Touching or steadying assistance (Pt > 75%) Chair/bed transfer assistive device: Armrests     Locomotion Ambulation     Max distance: 60 ft Assist level: Touching or steadying assistance (Pt > 75%)   Wheelchair          Cognition Comprehension Comprehension assist level: Understands basic 50 - 74% of the time/ requires cueing 25 - 49% of the time, Understands basic 75 - 89% of the time/ requires cueing 10 - 24% of the time  Expression Expression assist level: Expresses basic 50 - 74% of the time/requires cueing 25 - 49% of the time. Needs to repeat parts of sentences.  Social Interaction Social Interaction assist level: Interacts appropriately 90% of the time - Needs monitoring or encouragement for participation or interaction.  Problem Solving Problem solving assist level: Solves basic 50 - 74% of the time/requires cueing 25 - 49% of the time, Solves basic 75 - 89% of the time/requires cueing 10 - 24% of the time  Memory Memory assist level: Recognizes or recalls 25 - 49% of the time/requires cueing 50 - 75% of the time    Medical Problem List and Plan:  1. Debility secondary to Mitral repair and modified Maze procedure for symptomatic mitral regurgitation 04/16/2017 /multi-medical.No need for sternal prec  -continue CIR PT, OT  -dc tomorrow  -follow up with cardiology, PM&R 2. DVT Prophylaxis/Anticoagulation: Coumadin per pharmacy protocol   INR 2.6` therapeutic on 10/25 3. Pain Management: Tylenol as needed, heating pad as well as counter irritant cream right parascapular pain 4. Mood: Seroquel 50 mg daily at bedtime. Cranial CT reviewed, negative for acute changes  5. Neuropsych: This  patient is not fully capable of making decisions on his own behalf.  6. Skin/Wound Care: Routine skin checks  7. Fluids/Electrolytes/Nutrition: Routine I&Os, hypokalemia. We'll supplement 8. Dysphagia. Dysphagia 2 thin liquids. Follow-up speech therapy  9. CKD stage III. Followed in the past by Dr. Aurelio Jeweterding   Stable 10/22 BMP Latest Ref Rng & Units 04/29/2017 04/26/2017 04/25/2017  Glucose 65 - 99 mg/dL 161(W111(H) 960(A153(H) 540(J121(H)  BUN 6 - 20 mg/dL 81(X35(H) 91(Y62(H) 78(G77(H)  Creatinine 0.44 - 1.00 mg/dL 9.56(O1.44(H) 1.30(Q1.71(H) 6.57(Q1.74(H)  BUN/Creat Ratio 12 - 28 - - -  Sodium 135 - 145 mmol/L 135 149(H) 151(H)  Potassium 3.5 - 5.1 mmol/L 3.3(L) 3.8 3.0(L)  Chloride 101 - 111 mmol/L 104 115(H) 109  CO2 22 - 32 mmol/L 25 28 33(H)  Calcium 8.9 - 10.3 mg/dL 8.1(L) 8.5(L) 9.0   10. Hypertension. Tenormin 50 mg daily   Controlled 10/25, monitor for hypotension Vitals:   05/02/17 1534 05/03/17 0515  BP: (!) 122/57 135/76  Pulse: 79 94  Resp: 18 20  Temp: 98.1 F (36.7 C) (!) 97.5 F (36.4 C)  SpO2: 98% 97%   11. Leukocytosis. Improving.    down to 13K on 04/29/2017, monitor 12. Hypothyroidism. Synthroid  13. SOB: Improved  Desats briefly in PT off O2 needs cues for  deep breathing 14. Oral thrush   Diflucan ordered 10/20  Nystatin swish and swallow 15.  Hypokalemia: supplementing   LOS (Days) 7 A FACE TO FACE EVALUATION WAS PERFORMED  Amanda [name] T 05/03/2017 8:54 AM

## 2017-05-03 NOTE — Progress Notes (Signed)
Physical Therapy Discharge Summary  Patient Details  Name: Amanda [name] MRN: [id number] Date of Birth: Nov 12, 1940  Today's Date: 05/03/2017      Patient has met 12 of 12 long term goals due to improved activity tolerance, improved balance, improved postural control, increased strength, increased range of motion, decreased pain, ability to compensate for deficits, improved attention, improved awareness and improved coordination.  Patient to discharge at an ambulatory level Supervision.   Patient's care partner is independent to provide the necessary physical and cognitive assistance at discharge.  Reasons goals not met: all PT goals met   Recommendation:  Patient will benefit from ongoing skilled PT services in home health setting to continue to advance safe functional mobility, address ongoing impairments in balance, safety, ambulation, and minimize fall risk.  Equipment: RW  Reasons for discharge: treatment goals met and discharge from hospital  Patient/family agrees with progress made and goals achieved: Yes  PT Discharge Vital Signs Therapy Vitals Temp: (!) 97.5 F (36.4 C) Temp Source: Oral Pulse Rate: 94 Resp: 20 BP: 135/76 Patient Position (if appropriate): Lying Oxygen Therapy SpO2: 97 % O2 Device: Not Delivered Pain Pain Assessment Pain Assessment: No/denies pain Pain Score: 3  Pain Location: Back Pain Orientation: Right Pain Descriptors / Indicators: Aching Vision/Perception     Cognition   Sensation Sensation Light Touch: Appears Intact Proprioception: Appears Intact Coordination Gross Motor Movements are Fluid and Coordinated: Yes Fine Motor Movements are Fluid and Coordinated: Yes Motor  Motor Motor: Other (comment) Motor - Skilled Clinical Observations: generalized weakness,  Motor - Discharge Observations: improved strength and endurance from Eval  Mobility Bed Mobility Bed Mobility: Rolling Right;Rolling Left;Supine to Sit;Sit to  Supine Rolling Right: 5: Supervision Rolling Left: 5: Supervision Right Sidelying to Sit: 5: Supervision Supine to Sit: 5: Supervision Sit to Supine: 5: Supervision Transfers Transfers: Yes Sit to Stand: 5: Supervision Sit to Stand Details (indicate cue type and reason): with RW  Stand to Sit: 5: Supervision Stand to Sit Details: with RW Stand Pivot Transfers: 5: Supervision Stand Pivot Transfer Details (indicate cue type and reason): with RW Locomotion  Ambulation Ambulation: Yes Ambulation/Gait Assistance: 5: Supervision Ambulation Distance (Feet): 150 Feet Assistive device: Rolling walker Gait Gait: Yes Gait Pattern: Impaired Stairs / Additional Locomotion Stairs: Yes Stairs Assistance: 5: Supervision Stairs Assistance Details: Verbal cues for gait pattern Stair Management Technique: Two rails;One rail Right Number of Stairs: 12 Height of Stairs: 6 Wheelchair Mobility Wheelchair Mobility: Yes Wheelchair Assistance: 5: Careers information officer: Both upper extremities Distance: 56f   Trunk/Postural Assessment  Cervical Assessment Cervical Assessment: Exceptions to [facility](head forward) Thoracic Assessment Thoracic Assessment: Exceptions to [facility](rounded shoulders) Lumbar Assessment Lumbar Assessment: Exceptions to [facility](posterior pelvic tilt) Postural Control Postural Control: Within Functional Limits Righting Reactions: WFL Protective Responses: WFL   Balance Balance Balance Assessed: Yes Static Sitting Balance Static Sitting - Level of Assistance: 5: Stand by assistance Dynamic Sitting Balance Dynamic Sitting - Level of Assistance: 5: Stand by assistance Static Standing Balance Static Standing - Level of Assistance: 5: Stand by assistance Dynamic Standing Balance Dynamic Standing - Level of Assistance: 5: Stand by assistance Extremity Assessment      RLE Assessment RLE Assessment: Within Functional Limits LLE Assessment LLE Assessment: Within  Functional Limits   See Function Navigator for Current Functional Status.  ALorie Phenix10/26/2018, 9:08 AM

## 2017-05-03 NOTE — Progress Notes (Signed)
Occupational Therapy Session Note  Patient Details  Name: Amanda [name] MRN: [id number] Date of Birth: July 30, 1940  Today's Date: 05/03/2017 OT Individual Time: 1300-1330 OT Individual Time Calculation (min): 30 min    Short Term Goals: Week 1:  OT Short Term Goal 1 (Week 1): Pt will sit to stand with MIN A in prep for clothing management. OT Short Term Goal 2 (Week 1): Pt will stand pivot transfer to toilet wiht MIN A OT Short Term Goal 3 (Week 1): Pt will stand to groom 2/2 grooming items to improve functional endurance with touching A for balance. OT Short Term Goal 4 (Week 1): Pt will thread BLE into pants with supervision and no more than 1 Vc for clothing orientation  Skilled Therapeutic Interventions/Progress Updates:    1:1. Pt completes baathing and dressing at sit to stand level at sink. Pt requires min Vc for sequencing bathing body parts and frequent rest breaks 2/2 decreased endurance. Pt dons UB and LB clothing with supervision/set up with VC for reaching back with sit to stand. Pt completes oral care in standing with supervision/set up. OT demonsrates posterior method shower trnasfers with shower seat and pt return demonstrates with CGA and VC for sequencing. Exited session with pt seated in w/c  With QB donned and call light in reach  Therapy Documentation Precautions:  Precautions Precautions: Fall, Sternal Restrictions Weight Bearing Restrictions: No Other Position/Activity Restrictions: sternal precautions General:  See Function Navigator for Current Functional Status.   Therapy/Group: Individual Therapy  Shon Hale 05/03/2017, 6:46 AM

## 2017-05-03 NOTE — Progress Notes (Signed)
Physical Therapy Session Note  Patient Details  Name: Amanda [name] MRN: [id number] Date of Birth: 1940/11/10  Today's Date: 05/03/2017 PT Individual Time: 0800-0900 PT Individual Time Calculation (min): 60 min   Short Term Goals: Week 1:  PT Short Term Goal 1 (Week 1): Pt will recall 1/3 sternal precautions with mod cues PT Short Term Goal 2 (Week 1): Pt will transfer with mod assist consistently from a variety of surfaces PT Short Term Goal 3 (Week 1): Pt will ambulate 100' with LRAD and min assist PT Short Term Goal 4 (Week 1): Pt will initiate stair negotiation with PT  Skilled Therapeutic Interventions/Progress Updates:    Pt received supine in bed and agreeable to PT. Supine>sit transfer with supervision assist. PT assessed progress toward goals for grad day assessment.   PT instructed pt in gait training x 179ft with one standing rest break. Supervision assist throughout.   WC mobility x 93ft with SOB. No  Transfers with supervision assist from PT min cues for AD management with car transfer.   Stairs x 12 with 1-2 UE support and supervision assist from PT. Min cues for safety and gait pattern.   Pt ntoed to have increased SOB throughout treatment. SpO2 assessed multiple times throughout treatmean at >97% with HR of 90.   Hand off to SLP in pt room. RN made aware of pt condition.        Therapy Documentation Precautions:  Precautions Precautions: Fall, Sternal Restrictions Weight Bearing Restrictions: No Other Position/Activity Restrictions: sternal precautions General:   Vital Signs:  Pain: Pain Assessment Pain Assessment: No/denies pain Pain Score: 3  Pain Location: Back Pain Orientation: Right Pain Descriptors / Indicators: Aching Mobility: Bed Mobility Bed Mobility: Rolling Right;Rolling Left;Supine to Sit;Sit to Supine Rolling Right: 5: Supervision Rolling Left: 5: Supervision Right Sidelying to Sit: 5: Supervision Supine to Sit: 5:  Supervision Sit to Supine: 5: Supervision Transfers Transfers: Yes Sit to Stand: 5: Supervision Sit to Stand Details (indicate cue type and reason): with RW  Stand to Sit: 5: Supervision Stand to Sit Details: with RW Stand Pivot Transfers: 5: Supervision Stand Pivot Transfer Details (indicate cue type and reason): with RW Locomotion : Ambulation Ambulation: Yes Ambulation/Gait Assistance: 5: Supervision Ambulation Distance (Feet): 150 Feet Assistive device: Rolling walker Gait Gait: Yes Gait Pattern: Impaired Stairs / Additional Locomotion Stairs: Yes Stairs Assistance: 5: Supervision Stairs Assistance Details: Verbal cues for gait pattern Stair Management Technique: Two rails;One rail Right Number of Stairs: 12 Height of Stairs: 6 Wheelchair Mobility Wheelchair Mobility: Yes Wheelchair Assistance: 5: Investment banker, operational: Both upper extremities Distance: 8ft   Trunk/Postural Assessment : Cervical Assessment Cervical Assessment: Exceptions to [facility] (head forward) Thoracic Assessment Thoracic Assessment: Exceptions to [facility] (rounded shoulders) Lumbar Assessment Lumbar Assessment: Exceptions to [facility] (posterior pelvic tilt) Postural Control Postural Control: Within Functional Limits Righting Reactions: WFL Protective Responses: WFL   Balance: Balance Balance Assessed: Yes Static Sitting Balance Static Sitting - Level of Assistance: 5: Stand by assistance Dynamic Sitting Balance Dynamic Sitting - Level of Assistance: 5: Stand by assistance Static Standing Balance Static Standing - Level of Assistance: 5: Stand by assistance Dynamic Standing Balance Dynamic Standing - Level of Assistance: 5: Stand by assistance Exercises:   Other Treatments:     See Function Navigator for Current Functional Status.   Therapy/Group: Individual Therapy  Golden Pop 05/03/2017, 10:30 AM

## 2017-05-03 NOTE — Progress Notes (Signed)
SLP Happi reported to this RN that pt was pale, experiencing SOB at rest and exertion, pain across chest, and that cognitive fx was not at baseline. This RN assessed pt and noted same, reported findings to PA Dan Angiulli. PA assessed pt. PA reports that symptoms are likely r/t to musculature issues. Dr. Riley Kill to see pt. No new orders received at this time. On-going assessment of pt condition during shift.

## 2017-05-03 NOTE — Progress Notes (Signed)
Physical Therapy Session Note  Patient Details  Name: Amanda [name] MRN: [id number] Date of Birth: 1940/07/23  Today's Date: 05/03/2017 PT Individual Time: 1055-1200 PT Individual Time Calculation (min): 65 min   Short Term Goals: Week 1:  PT Short Term Goal 1 (Week 1): Pt will recall 1/3 sternal precautions with mod cues PT Short Term Goal 2 (Week 1): Pt will transfer with mod assist consistently from a variety of surfaces PT Short Term Goal 3 (Week 1): Pt will ambulate 100' with LRAD and min assist PT Short Term Goal 4 (Week 1): Pt will initiate stair negotiation with PT  Skilled Therapeutic Interventions/Progress Updates:    c/o some intermittent pain in chest associated with SOB but resolves with cues for slow breathing.  Session focus on balance with toileting and pain control for neck pain.    Pt transfers throughout session with supervision and cues for UE use.  Toileting at overall supervision level, min verbal cues to stand up slowly.    PT provides manual PROM stretching and soft tissue mobilization for 3x30 seconds SCM, traps, and scalenes with improvement in pain and flexibility.  Moist heat applied for 10 minutes with further reduction of pain. Pt slightly dizzy with supine>sit but resolves with rest.    Pt returned to room at end of session, upright in w/c with family present.   Therapy Documentation Precautions:  Precautions Precautions: Fall, Sternal Restrictions Weight Bearing Restrictions: No Other Position/Activity Restrictions: sternal precautions   See Function Navigator for Current Functional Status.   Therapy/Group: Individual Therapy  Stephania Fragmin 05/03/2017, 1:24 PM

## 2017-05-03 NOTE — Progress Notes (Addendum)
Speech Language Pathology Daily Session Note  Patient Details  Name: Amanda [name] MRN: [id number] Date of Birth: 1940-10-22  Today's Date: 05/03/2017 SLP Individual Time: 0900-0925 SLP Individual Time Calculation (min): 25 min  Short Term Goals: Week 1: SLP Short Term Goal 1 (Week 1): Pt will consume dys 2 textures and thin liquids with min verbal cues to clear solids from the oral cavity.   SLP Short Term Goal 2 (Week 1): Pt will sustain her attention to basic tasks for 5 minutes with mod verbal cues for redirection.   SLP Short Term Goal 3 (Week 1): Pt will utilize external aids to orient to place, date, and situation with mod assist verbal cues.  SLP Short Term Goal 4 (Week 1): Pt will complete basic, familiar tasks with mod verbal cues for functional problem solving.   SLP Short Term Goal 5 (Week 1): Pt will identify at least 2 deficits occurring s/p hospitalization with mod question cues.   SLP Short Term Goal 6 (Week 1): Pt will return demonstration of at least 2 safety precautions during functional tasks with mod verbal cues.  Skilled Therapeutic Interventions: SLP received pt upright in recliner and pt sated that she didn't feel well. SLP attempted to engage pt in eating breakfast however pt was SOB while attempting to open items and self-feed. She stated she was having pain across her chest and was pale in appearance. SLP requested nursing assess pt. Pt missed 25 minutes of skilled ST d/t decline.      Function:  Eating Eating   Modified Consistency Diet: Yes Eating Assist Level: Set up assist for;More than reasonable amount of time;Supervision or verbal cues   Eating Set Up Assist For: Opening containers       Cognition Comprehension Comprehension assist level: Understands basic 50 - 74% of the time/ requires cueing 25 - 49% of the time;Understands basic 75 - 89% of the time/ requires cueing 10 - 24% of the time  Expression   Expression assist level: Expresses basic  50 - 74% of the time/requires cueing 25 - 49% of the time. Needs to repeat parts of sentences.  Social Interaction Social Interaction assist level: Interacts appropriately 90% of the time - Needs monitoring or encouragement for participation or interaction.  Problem Solving Problem solving assist level: Solves basic 50 - 74% of the time/requires cueing 25 - 49% of the time  Memory Memory assist level: Recognizes or recalls 25 - 49% of the time/requires cueing 50 - 75% of the time    Pain Pain Assessment Pain Assessment: No/denies pain Pain Score: 3  Pain Location: Back Pain Orientation: Right Pain Descriptors / Indicators: Aching  Therapy/Group: Individual Therapy  Kassidy Dockendorf 05/03/2017, 9:28 AM

## 2017-05-03 NOTE — Progress Notes (Signed)
ANTICOAGULATION CONSULT NOTE - Follow Up Consult  Pharmacy Consult for warfarin Indication: atrial fibrillation  Allergies  Allergen Reactions  . Pepcid [Famotidine] Swelling and Other (See Comments)    "Throat Swelling"   . Allopurinol Other (See Comments)    Caused headaches  . Amlodipine Other (See Comments)    PEDAL EDEMA   . Colchicine Other (See Comments)    UNSPECIFIED REACTION   . Contrast Media [Iodinated Diagnostic Agents] Hives    IVP dye per patient  . Prilosec [Omeprazole] Nausea Only  . Atorvastatin Other (See Comments)    UNSPECIFIED REACTION   . Cephalexin Itching and Rash  . Ciprofloxacin Itching and Rash  . Erythromycin Itching and Rash  . Sulfonamide Derivatives Itching and Rash  . Tetracycline Itching and Rash    Patient Measurements: Height: 5' 6.5" (168.9 cm) Weight: 143 lb 11.2 oz (65.2 kg) IBW/kg (Calculated) : 60.45  Vital Signs: Temp: 97.5 F (36.4 C) (10/26 0515) Temp Source: Oral (10/26 0515) BP: 135/76 (10/26 0515) Pulse Rate: 94 (10/26 0515)  Labs:  Recent Labs  05/01/17 0626 05/02/17 0651 05/03/17 0705  LABPROT 31.5* 30.7* 27.7*  INR 3.08 2.98 2.61    Estimated Creatinine Clearance: 30.8 mL/min (A) (by C-G formula based on SCr of 1.44 mg/dL (H)). Assessment: Amanda [name] with h/o Afib on warfarin s/p mitral valve repair and clipping of atrial appendage. INR 2.61 today, after lower dose, hgb stable, pltc 192K, improved. Likely discharge today  Goal of Therapy:  INR 2-3 Monitor platelets by anticoagulation protocol: Yes   Plan:  -Recommend coumadin 1mg  daily at discharge -Monitor daily PT/INR   Bayard Hugger, PharmD, BCPS  Clinical Pharmacist  Pager: [phone]   05/03/2017 1:21 PM

## 2017-05-04 LAB — PROTIME-INR
INR: 2.51
Prothrombin Time: 26.9 seconds — ABNORMAL HIGH (ref 11.4–15.2)

## 2017-05-04 NOTE — Patient Care Conference (Signed)
Inpatient RehabilitationTeam Conference and Plan of Care Update Date: 05/01/2017   Time: 10:55 AM    Patient Name: Amanda [name]      Medical Record Number: [id number]  Date of Birth: 05/14/1941 Sex: Female         Room/Bed: 4W01C/4W01C-01 Payor Info: Payor: [employer] MEDICARE / Plan: [employer] MEDICARE / Product Type: *No Product type* /    Admitting Diagnosis: Debility  Admit Date/Time:  04/26/2017  5:37 PM Admission Comments: No comment available   Primary Diagnosis:  <principal problem not specified> Principal Problem: <principal problem not specified>  Patient Active Problem List   Diagnosis Date Noted  . Chronic anticoagulation   . Benign essential HTN   . Oral thrush   . Subtherapeutic anticoagulation   . Sternal pain   . SOB (shortness of breath)   . Stage 3 chronic kidney disease (HCC)   . Debility 04/26/2017  . History of CVA (cerebrovascular accident)   . S/P placement of cardiac pacemaker   . Post-operative pain   . Labile blood glucose   . Hypernatremia   . S/P minimally invasive mitral valve repair + maze procedure 04/16/2017  . S/P minimally invasive maze operation for atrial fibrillation 04/16/2017  . DOE (dyspnea on exertion) 03/05/2017  . Paroxysmal atrial fibrillation (HCC)   . Acute delirium 04/28/2015  . Acute UTI 04/01/2015  . Acute encephalopathy 04/01/2015  . AKI (acute kidney injury) (HCC) 04/01/2015  . Leukocytosis 04/01/2015  . Thrombocytopenia (HCC) 04/01/2015  . Hyponatremia 04/01/2015  . Hypothyroidism 04/01/2015  . CKD (chronic kidney disease), stage III (HCC) 04/01/2015  . UTI (lower urinary tract infection)   . Severe sepsis without septic shock (HCC)   . Pacemaker 02/03/2015  . AV block 01/06/2015  . RBBB 05/21/2014  . Mitral regurgitation 11/26/2013  . PAC (premature atrial contraction)   . MVP (mitral valve prolapse) 11/11/2013  . DIARRHEA 07/24/2010  . GLOMERULONEPHRITIS 03/27/2010  . COLITIS 10/06/2009  . Essential  hypertension 10/05/2009  . DYSPHAGIA UNSPECIFIED 10/05/2009  . Personal history of other endocrine, metabolic, and immunity disorders 10/05/2009    Expected Discharge Date: Expected Discharge Date: 05/04/17  Team Members Present: Physician leading conference: Dr. Claudette LawsAndrew Kirsteins Social Worker Present: Staci AcostaJenny Christorpher Hisaw, LCSW PT Present: Grier RocherAustin Tucker, PT OT Present: Roney MansJennifer Smith, OT SLP Present: Reuel DerbyHappi Overton, SLP PPS Coordinator present : Tora DuckMarie Noel, RN, CRRN     Current Status/Progress Goal Weekly Team Focus  Medical   no chest pain, Min/S mobility, RIght scapular pain  improve pain Right periscap  Work on Scapular pain   Bowel/Bladder   continent of b/b, LBM 10/22, no medication ordered daily, none prn  maintain b/b continence with mod I assist  monitor b/b status q shift and prn   Swallow/Nutrition/ Hydration   Min A  Supervision  use of compensatory swallow strategies   ADL's   min A - setup with extra time for self care retraining   supervision   activity tolerance/ endurance, self care retraining , fam educ   Mobility   minA HHA gait up to 14550ft, minA/min guard transfers - varies per fatigue level, fair dynamic standing balance, poor endurance   supervision overall  endurace, balance, d/c planning   Communication   Mod A to Min A  Min A  recall of new information, sustained attention, basic problem solving   Safety/Cognition/ Behavioral Observations     Min A  task initation, sustained attention   Pain   pain is not an issue  Skin   thrush to mouth improving with nystatin s&s,  chest incision open in a section and denuded, need dressing order  maintain skin/incision care with mod I assist  monitor q shift and prn    Rehab Goals Patient on target to meet rehab goals: Yes Rehab Goals Revised: none *See Care Plan and progress notes for long and short-term goals.     Barriers to Discharge  Current Status/Progress Possible Resolutions Date Resolved    Physician    Medical stability     progressing toward goals  Adjust       Nursing                  PT                    OT                  SLP                SW                Discharge Planning/Teaching Needs:  Pt to go home with her husband and dtr to provide supervision and assistance as needed.  Family will come for family education over the next two days prior to d/c.   Team Discussion:  Pt is doing well medically, but has scapular pain - kpad and bengay to area.  Pt does not have any sternal precautions.  Pt is doing well with PT, but with pain limiting her bed mobility.  Pt's orientation is better.  Pt is continent.  Family is asking for Aleve to be added, as she usually takes this at home.  Revisions to Treatment Plan:  none    Continued Need for Acute Rehabilitation Level of Care: The patient requires daily medical management by a physician with specialized training in physical medicine and rehabilitation for the following conditions: Daily direction of a multidisciplinary physical rehabilitation program to ensure safe treatment while eliciting the highest outcome that is of practical value to the patient.: Yes Daily medical management of patient stability for increased activity during participation in an intensive rehabilitation regime.: Yes Daily analysis of laboratory values and/or radiology reports with any subsequent need for medication adjustment of medical intervention for : Post surgical problems;Cardiac problems  Dillan Candela, [place] 05/04/2017, 12:17 AM

## 2017-05-04 NOTE — Progress Notes (Signed)
Amanda [name] PHYSICAL MEDICINE & REHABILITATION     PROGRESS NOTE  Subjective/Complaints:   No new complaints. Had a great night. Happy to be going home today  ROS: pt denies nausea, vomiting, diarrhea, cough, shortness of breath or chest pain   Objective: Vital Signs: Blood pressure 131/70, pulse 99, temperature (!) 97.4 F (36.3 C), temperature source Oral, resp. rate 20, height 5' 6.5" (1.689 m), weight 64 kg (141 lb 1.5 oz), SpO2 99 %. No results found. No results for input(s): WBC, HGB, HCT, PLT in the last 72 hours. No results for input(s): NA, K, CL, GLUCOSE, BUN, CREATININE, CALCIUM in the last 72 hours.  Invalid input(s): CO CBG (last 3)  No results for input(s): GLUCAP in the last 72 hours.  Wt Readings from Last 3 Encounters:  05/04/17 64 kg (141 lb 1.5 oz)  04/26/17 62.9 kg (138 lb 9.6 oz)  04/15/17 64.4 kg (142 lb)   INR 2.1 Physical Exam:  BP 131/70 (BP Location: Right Arm)   Pulse 99   Temp (!) 97.4 F (36.3 C) (Oral)   Resp 20   Ht 5' 6.5" (1.689 m)   Wt 64 kg (141 lb 1.5 oz)   SpO2 99%   BMI 22.43 kg/m  Constitutional: She appears well-nourished. No distress.  HENT: Normocephalic. Atraumatic. Eyes: EOM are normal. No discharge. Cardiovascular: RRR without murmur. No JVD    Respiratory: CTA Bilaterally without wheezes or rales. Normal effort  GI: Bowel sounds are normal. She exhibits no distension.  Musculoskeletal: Tenderness palpation over the lower medial scapular border on the right side, no masses palpated Skin. Warm and dry. Intact  Neurological: She is alert.  Motor: LUE/LLE: 4+/5 proximal to distal  RUE/RLE: 4/5 proximal to distal (stable).  Psych: Pleasant and cooperative   Assessment/Plan: 1. Functional deficits secondary to debility which require 3+ hours per day of interdisciplinary therapy in a comprehensive inpatient rehab setting. Physiatrist is providing close team supervision and 24 hour management of active medical problems listed  below. Physiatrist and rehab team continue to assess barriers to discharge/monitor patient progress toward functional and medical goals.  Function:  Bathing Bathing position   Position: Wheelchair/chair at sink  Bathing parts Body parts bathed by patient: Right arm, Left arm, Chest, Abdomen, Front perineal area, Right upper leg, Left upper leg, Left lower leg, Buttocks, Right lower leg Body parts bathed by helper: Back  Bathing assist Assist Level: Supervision or verbal cues      Upper Body Dressing/Undressing Upper body dressing   What is the patient wearing?: Pull over shirt/dress     Pull over shirt/dress - Perfomed by patient: Thread/unthread right sleeve, Thread/unthread left sleeve, Put head through opening, Pull shirt over trunk Pull over shirt/dress - Perfomed by helper: Pull shirt over trunk        Upper body assist Assist Level: Supervision or verbal cues      Lower Body Dressing/Undressing Lower body dressing   What is the patient wearing?: Pants, Underwear, Non-skid slipper socks Underwear - Performed by patient: Thread/unthread right underwear leg, Thread/unthread left underwear leg, Pull underwear up/down   Pants- Performed by patient: Thread/unthread right pants leg, Thread/unthread left pants leg, Pull pants up/down Pants- Performed by helper: Thread/unthread left pants leg, Pull pants up/down Non-skid slipper socks- Performed by patient: Don/doff right sock, Don/doff left sock   Socks - Performed by patient: Don/doff right sock, Don/doff left sock   Shoes - Performed by patient: Don/doff right shoe, Don/doff left shoe  Lower body assist Assist for lower body dressing: Supervision or verbal cues      Toileting Toileting Toileting activity did not occur: No continent bowel/bladder event Toileting steps completed by patient: Adjust clothing prior to toileting, Performs perineal hygiene, Adjust clothing after toileting Toileting steps completed  by helper: Performs perineal hygiene, Adjust clothing after toileting Toileting Assistive Devices: Grab bar or rail  Toileting assist Assist level: Supervision or verbal cues   Transfers Chair/bed transfer   Chair/bed transfer method: Stand pivot Chair/bed transfer assist level: Supervision or verbal cues Chair/bed transfer assistive device: Armrests     Locomotion Ambulation     Max distance: 27500ft  Assist level: Supervision or verbal cues   Wheelchair Wheelchair activity did not occur: N/A        Cognition Comprehension Comprehension assist level: Understands basic 75 - 89% of the time/ requires cueing 10 - 24% of the time  Expression Expression assist level: Expresses basic 75 - 89% of the time/requires cueing 10 - 24% of the time. Needs helper to occlude trach/needs to repeat words.  Social Interaction Social Interaction assist level: Interacts appropriately 75 - 89% of the time - Needs redirection for appropriate language or to initiate interaction.  Problem Solving Problem solving assist level: Solves basic 75 - 89% of the time/requires cueing 10 - 24% of the time  Memory Memory assist level: Recognizes or recalls 75 - 89% of the time/requires cueing 10 - 24% of the time    Medical Problem List and Plan:  1. Debility secondary to Mitral repair and modified Maze procedure for symptomatic mitral regurgitation 04/16/2017 /multi-medical.No need for sternal prec  -continue CIR PT, OT  -dc today  -follow up with cardiology, PM&R 2. DVT Prophylaxis/Anticoagulation: Coumadin per pharmacy protocol   INR 2.5` therapeutic on 10/27 3. Pain Management: Tylenol as needed, heating pad as well as counter irritant cream right parascapular pain 4. Mood: Seroquel 50 mg daily at bedtime. Cranial CT reviewed, negative for acute changes  5. Neuropsych: This patient is not fully capable of making decisions on his own behalf.  6. Skin/Wound Care: Routine skin checks  7.  Fluids/Electrolytes/Nutrition: Routine I&Os, hypokalemia. We'll supplement 8. Dysphagia. Dysphagia 2 thin liquids. Follow-up speech therapy  9. CKD stage III. Followed in the past by Dr. Aurelio Jeweterding   Stable 10/22 BMP Latest Ref Rng & Units 04/29/2017 04/26/2017 04/25/2017  Glucose 65 - 99 mg/dL 161(W111(H) 960(A153(H) 540(J121(H)  BUN 6 - 20 mg/dL 81(X35(H) 91(Y62(H) 78(G77(H)  Creatinine 0.44 - 1.00 mg/dL 9.56(O1.44(H) 1.30(Q1.71(H) 6.57(Q1.74(H)  BUN/Creat Ratio 12 - 28 - - -  Sodium 135 - 145 mmol/L 135 149(H) 151(H)  Potassium 3.5 - 5.1 mmol/L 3.3(L) 3.8 3.0(L)  Chloride 101 - 111 mmol/L 104 115(H) 109  CO2 22 - 32 mmol/L 25 28 33(H)  Calcium 8.9 - 10.3 mg/dL 8.1(L) 8.5(L) 9.0   10. Hypertension. Tenormin 50 mg daily   Controlled 10/25, monitor for hypotension Vitals:   05/03/17 1410 05/04/17 0510  BP: (!) 129/54 131/70  Pulse: 89 99  Resp: 19 20  Temp: 98.4 F (36.9 C) (!) 97.4 F (36.3 C)  SpO2: 99% 99%   11. Leukocytosis. Improving.    down to 13K on 04/29/2017, monitor 12. Hypothyroidism. Synthroid  13. SOB: Improved  -an anxiety component. Reviewed need to relax, take deep breaths 14. Oral thrush   Diflucan ordered 10/20  Nystatin swish and swallow 15.  Hypokalemia: supplementing   LOS (Days) 8 A FACE TO FACE EVALUATION WAS PERFORMED  Eiley Mcginnity T 05/04/2017 8:39 AM

## 2017-05-04 NOTE — Progress Notes (Signed)
ANTICOAGULATION CONSULT NOTE - Follow Up Consult  Pharmacy Consult for warfarin Indication: atrial fibrillation  Allergies  Allergen Reactions  . Pepcid [Famotidine] Swelling and Other (See Comments)    "Throat Swelling"   . Allopurinol Other (See Comments)    Caused headaches  . Amlodipine Other (See Comments)    PEDAL EDEMA   . Colchicine Other (See Comments)    UNSPECIFIED REACTION   . Contrast Media [Iodinated Diagnostic Agents] Hives    IVP dye per patient  . Prilosec [Omeprazole] Nausea Only  . Atorvastatin Other (See Comments)    UNSPECIFIED REACTION   . Cephalexin Itching and Rash  . Ciprofloxacin Itching and Rash  . Erythromycin Itching and Rash  . Sulfonamide Derivatives Itching and Rash  . Tetracycline Itching and Rash    Patient Measurements: Height: 5' 6.5" (168.9 cm) Weight: 141 lb 1.5 oz (64 kg) IBW/kg (Calculated) : 60.45  Vital Signs: Temp: 97.4 F (36.3 C) (10/27 0510) Temp Source: Oral (10/27 0510) BP: 131/70 (10/27 0510) Pulse Rate: 99 (10/27 0510)  Labs:  Recent Labs  05/02/17 0651 05/03/17 0705 05/04/17 0624  LABPROT 30.7* 27.7* 26.9*  INR 2.98 2.61 2.51    Estimated Creatinine Clearance: 30.8 mL/min (A) (by C-G formula based on SCr of 1.44 mg/dL (H)). Assessment: 55 yoF with h/o Afib on warfarin s/p mitral valve repair and clipping of atrial appendage. INR 2.51 today. Hgb stable and pltc WNL, improved. No bleeding noted.  Goal of Therapy:  INR 2-3 Monitor platelets by anticoagulation protocol: Yes   Plan:  -Continue warfarin 1mg  daily -Monitor daily PT/INR   Kery Batzel N. Zigmund Daniel, PharmD PGY1 Pharmacy Resident Pager: [phone] 05/04/2017 8:09 AM

## 2017-05-04 NOTE — Progress Notes (Signed)
Patient discharged home with Spouse and daughter. Reviewed medication schedule. No other questions, no pain. NT escorted family to car.

## 2017-05-04 NOTE — Progress Notes (Signed)
Speech Language Pathology Discharge Summary  Patient Details  Name: Amanda [name] MRN: [id number] Date of Birth: 1941/06/01  Today's Date: 05/04/2017   Patient has met 7 of 7 long term goals.  Patient to discharge at overall Min;Supervision level.    Clinical Impression/Discharge Summary:   Pt made good progress during ST sessions and is currently tolerating a dysphagia 3 diet with thin liquids without overt s/s of aspiration. Pt continues to require Min A to supervision for cognitive information d/t chronic confusion. All education completed with husband and pt's daughter. All demonstrate understanding and ability to provide supervision to [place] A for pt within home environment.   Care Partner:  Caregiver Able to Provide Assistance: Yes  Type of Caregiver Assistance: Cognitive  Recommendation:  24 hour supervision/assistance;Home Health SLP;Outpatient SLP  Rationale for SLP Follow Up: Maximize cognitive function and independence;Reduce caregiver burden;Maximize swallowing safety     Reasons for discharge: Treatment goals met   Patient/Family Agrees with Progress Made and Goals Achieved: Yes   Leamon Palau 05/04/2017, 9:11 AM

## 2017-05-05 NOTE — Progress Notes (Signed)
Social Work Patient ID: Amanda [name], female   DOB: [birth year], 76 y.o.   MRN: [id number]   CSW met with pt and her dtr and husband on 05-01-17 to update them on team conference discussion and to go over d/c planning.  They were pleased to hear pt would be ready for d/c on 05-04-17 and will be with her when she goes home to provide supervision.  CSW will set up [facility] and order pt's DME.  CSW also gave them a signed handicapped placard application in case they want to get a placard for pt.  Pt's family is very appreciative of the care they have received on CIR, but are ready to take pt home.  CSW remains available to assist as needed.

## 2017-05-05 NOTE — Progress Notes (Signed)
Social Work Discharge Note  The overall goal for the admission was met for:   Discharge location: Yes - home with family  Length of Stay: Yes - 8 days  Discharge activity level: Yes - supervision  Home/community participation: Yes  Services provided included: MD, RD, PT, OT, SLP, RN, Pharmacy and [place]: Private Insurance: [employer]  Follow-up services arranged: Home Health: PT/OT/ST/RN from [facility], DME: rolling walker and shower seat from [place] and Patient/Family has no preference for HH/DME agencies  Comments (or additional information): Pt d/c with her family to her home where they will provide 24/7 supervision.  They are so pleased with her recovery and care.  Patient/Family verbalized understanding of follow-up arrangements: Yes  Individual responsible for coordination of the follow-up plan: pt's husband and dtr  Confirmed correct DME delivered: Trey Sailors 05/05/2017    Sanjit Mcmichael, Silvestre Mesi

## 2017-05-06 ENCOUNTER — Telehealth: Payer: Self-pay | Admitting: Internal Medicine

## 2017-05-06 NOTE — Telephone Encounter (Signed)
New Message  Pt call requesting to speak with the device clinic to reschedule remote transmission from 10/15. Per pt husband. Pt was in the hospital .please call back to discuss

## 2017-05-06 NOTE — Telephone Encounter (Signed)
Informed daughter that remote was rescheduled to 05/20/17. Daughter verbalized understanding.

## 2017-05-08 ENCOUNTER — Ambulatory Visit (INDEPENDENT_AMBULATORY_CARE_PROVIDER_SITE_OTHER): Payer: Medicare Other | Admitting: Cardiology

## 2017-05-08 DIAGNOSIS — I341 Nonrheumatic mitral (valve) prolapse: Secondary | ICD-10-CM

## 2017-05-08 DIAGNOSIS — Z8673 Personal history of transient ischemic attack (TIA), and cerebral infarction without residual deficits: Secondary | ICD-10-CM

## 2017-05-08 DIAGNOSIS — I480 Paroxysmal atrial fibrillation: Secondary | ICD-10-CM

## 2017-05-08 DIAGNOSIS — Z7901 Long term (current) use of anticoagulants: Secondary | ICD-10-CM

## 2017-05-08 LAB — POCT INR: INR: 2.9

## 2017-05-13 ENCOUNTER — Ambulatory Visit (INDEPENDENT_AMBULATORY_CARE_PROVIDER_SITE_OTHER): Payer: Self-pay | Admitting: Thoracic Surgery (Cardiothoracic Vascular Surgery)

## 2017-05-13 ENCOUNTER — Other Ambulatory Visit: Payer: Self-pay

## 2017-05-13 ENCOUNTER — Other Ambulatory Visit: Payer: Self-pay | Admitting: *Deleted

## 2017-05-13 ENCOUNTER — Encounter: Payer: Self-pay | Admitting: Thoracic Surgery (Cardiothoracic Vascular Surgery)

## 2017-05-13 ENCOUNTER — Other Ambulatory Visit: Payer: Self-pay | Admitting: Thoracic Surgery (Cardiothoracic Vascular Surgery)

## 2017-05-13 ENCOUNTER — Ambulatory Visit
Admission: RE | Admit: 2017-05-13 | Discharge: 2017-05-13 | Disposition: A | Discharge status: Home / Self Care | Payer: Medicare Other | Source: Ambulatory Visit | Attending: Thoracic Surgery (Cardiothoracic Vascular Surgery) | Admitting: Thoracic Surgery (Cardiothoracic Vascular Surgery)

## 2017-05-13 VITALS — BP 149/87 | HR 117 | Resp 16 | Ht 65.5 in | Wt 142.0 lb

## 2017-05-13 DIAGNOSIS — I341 Nonrheumatic mitral (valve) prolapse: Secondary | ICD-10-CM

## 2017-05-13 DIAGNOSIS — Q8789 Other specified congenital malformation syndromes, not elsewhere classified: Secondary | ICD-10-CM

## 2017-05-13 DIAGNOSIS — Z8679 Personal history of other diseases of the circulatory system: Secondary | ICD-10-CM

## 2017-05-13 DIAGNOSIS — I340 Nonrheumatic mitral (valve) insufficiency: Secondary | ICD-10-CM

## 2017-05-13 DIAGNOSIS — I480 Paroxysmal atrial fibrillation: Secondary | ICD-10-CM

## 2017-05-13 DIAGNOSIS — Z9889 Other specified postprocedural states: Secondary | ICD-10-CM

## 2017-05-13 DIAGNOSIS — J90 Pleural effusion, not elsewhere classified: Secondary | ICD-10-CM

## 2017-05-13 NOTE — Patient Instructions (Addendum)
Increase Atenolol to 50 mg by mouth twice daily instead of once daily  Continue all other previous medications without any changes at this time  Stop taking Coumadin (warfarin) until you have the fluid drained from around your lung.  Resume taking Coumadin once the fluid has been drained  You may continue to gradually increase your physical activity as tolerated.  Refrain from any heavy lifting or strenuous use of your arms and shoulders until at least 8 weeks from the time of your surgery, and avoid activities that cause increased pain in your chest on the side of your surgical incision.  Otherwise you may continue to increase activities without any particular limitations.  Increase the intensity and duration of physical activity gradually.

## 2017-05-13 NOTE — Progress Notes (Signed)
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  The patient's immediate postoperative recovery was notable for chest wall bleeding requiring return to the operating room on the evening of surgery.  She subsequently recovered and progressed very well from a cardiovascular standpoint, although she experienced significant postoperative delirium.  This slowly improved but limited her physical recovery for a considerable period of time.  Her oral intake was poor.  Amiodarone was stopped due to concerns that it might be affecting her appetite.  She slowly improved and eventually was discharged to the inpatient rehab service on the 10th postoperative day.  Her mental status cleared and her mobility improved fairly quickly, and she was eventually discharged home approximately 1 week later.  Since then her prothrombin time has been checked at the Coumadin clinic and her Coumadin dose adjusted.  Her most recent INR was 2.9 on May 08, 2017.  She has not yet been seen in follow-up at Dr. Norris Cross office but she has an appointment scheduled later this week.  The patient returns to the office  today with her daughter and husband present.  She states that she feels as though she is getting along quite well but she still gets short of breath with activity.  She has not had significant chest pain or chest tightness.  She has mild soreness associated with her surgical incision.  She has not had palpitations or dizzy spells, although home health nursing has commented that over the last few days her heart rate has been somewhat increased.  Her appetite remains marginal but according to her family she is eating some.  She is sleeping reasonably well at night but she states that she is having some bad dreams.  Overall both she and her family are pleased with her progress.   Current Outpatient Medications  Medication Sig Dispense Refill  . Amoxicillin-Pot Clavulanate (AUGMENTIN PO) Take 1 tablet 2 (two) times daily by mouth.    [name] atenolol (TENORMIN) 50 MG tablet Take 1 tablet (50 mg total) by mouth daily. 30 tablet 11  . conjugated estrogens (PREMARIN) vaginal cream Place 1 Applicatorful vaginally every 3 (three) days.     . fluticasone (FLONASE) 50 MCG/ACT nasal spray Place 2 sprays into both nostrils daily.    [name] levothyroxine (SYNTHROID, LEVOTHROID) 75 MCG tablet Take 1 tablet (75 mcg total) by mouth See admin instructions. Take 75 mcg by mouth daily around 0300 30 tablet 0  . pantoprazole (PROTONIX) 40 MG tablet Take 1 tablet (40 mg total) by mouth daily. 30 tablet 0  . Probiotic Product (PROBIOTIC DAILY PO) Take 1 tablet by mouth daily.    . QUEtiapine (SEROQUEL) 50 MG tablet Take 1 tablet (50 mg total) by mouth at bedtime. 30  tablet 0  . warfarin (COUMADIN) 1 MG tablet Take 1 tablet (1 mg total) by mouth daily. 30 tablet 11   No current facility-administered medications for this visit.       Physical Exam:   BP (!) 149/87 (BP Location: Left Arm, Patient Position: Sitting, Cuff Size: Normal)   Pulse (!) 117   Resp 16   Ht 5' 5.5" (1.664 m)   Wt 142 lb (64.4 kg)   SpO2 94% Comment: ON  RA  BMI 23.27 kg/m   General:  Elderly but well-appearing  Chest:   Diminished breath sounds on right side, clear on left  CV:   Tachycardic with regular rate, no murmur noted  Incisions:  Healing nicely  Abdomen:  Soft nontender  Extremities:  Warm and well-perfused, no edema  Diagnostic Tests:  2 channel telemetry rhythm strip demonstrates paced rhythm at elevated heart rate, approximately 110 bpm    CHEST  2 VIEW  COMPARISON:  April 27, 2017  FINDINGS: Previously noted small pneumothorax on the right is no longer appreciable. There is a moderate pleural effusion on the right with both free-flowing and apparent loculated components. There is patchy airspace consolidation in the right middle and lower lobes. Left lung is clear. Heart is upper normal in size with pulmonary vascularity within normal limits. Pacemaker leads are attached to the right atrium and right ventricle. Patient is status post mitral valve replacement with left apical appendage clamp present. There is aortic atherosclerosis. No adenopathy. No bone lesions.  IMPRESSION: Moderate pleural effusion on the right with both free-flowing and loculated components. Moderate atelectasis and consolidation right middle and lower lobes. Left lung now clear. No pneumothorax evident.  Heart upper normal in size with pulmonary vascularity normal. There is aortic atherosclerosis. Areas of postoperative change noted.  Aortic Atherosclerosis (ICD10-I70.0).   Electronically Signed   By: Bretta BangWilliam  Woodruff III M.D.   On: 05/13/2017 15:10   Impression:  Patient is progressing reasonably well approximately 1 month status post minimally invasive mitral valve repair and Maze procedure.  However, she may be in atrial flutter as her pulse is elevated in our office today.  In addition, she has developed a moderate size right pleural effusion.   Plan:  I have instructed the patient to stop taking Coumadin for the  next 2 days.  We will arrange for ultrasound-guided right thoracentesis to drain her right pleural effusion.  Once this is been done she will resume taking Coumadin.  I have also instructed the patient to double up her dose of atenolol to 50 mg by mouth twice daily until she is seen in the cardiology office later this week.  Her pacemaker needs to be interrogated to make certain it is functioning normally and to find out whether or not the patient may be in atrial flutter or atrial fibrillation.  Patient will return to our office in 2 weeks with a repeat chest x-ray.  All of her questions have been addressed.    Salvatore Decentlarence H. Cornelius Moraswen, MD 05/13/2017 3:58 PM

## 2017-05-14 ENCOUNTER — Telehealth: Payer: Self-pay

## 2017-05-14 ENCOUNTER — Telehealth: Payer: Self-pay | Admitting: *Deleted

## 2017-05-14 NOTE — Telephone Encounter (Signed)
Pt was due an INR today by [facility] RN, Diplomatic Services operational officer called & her test  strips are on the recal list, therefore, will have to have INR checked in the office.  Also, she stated that the pt stopped Coumadin on 05/13/17 in preparation for a Thoracentesis on 05/16/17 & is on Amoxocillin. Advised that once the pt restarts Coumadin we would need an INR recheck. If [facility] RN gets new strips HH RN can check the INR, otherwise we need to schedule in the office. Pt or HH RN will call once Coumadin restarted so we can ensure pt is seen in a timely manner.

## 2017-05-14 NOTE — Telephone Encounter (Signed)
Instructed patient on how to send manual transmission. Will call patient back once transmission is received.

## 2017-05-14 NOTE — Telephone Encounter (Signed)
Had patient come in today for device interrogation - please see attached - no afib or aflutter.  HR 104bpm.  BB increased yesterday and she will be seen back by Ronie Spies PA on Friday 11/9 to see how her HR is doing

## 2017-05-14 NOTE — Telephone Encounter (Signed)
-----   Message -----  From: Quintella Reichert, MD  Sent: 05/14/2017  2:16 PM  To: Henrietta Dine, RN   Please have patient come in to device clinic to determine if she is in afib or flutter   Traci  ----- Message -----  From: Purcell Nails, MD  Sent: 05/13/2017  6:04 PM  To: Quintella Reichert, MD   Spoke with patient and daughter to see if they could send pacemaker report remotely. Patient did not know how to send report remotely, informed her that I would send a message to the device clinic to give patient a call with further instructions. Patient has an appointment with Ronie Spies PA on 05/17/18.

## 2017-05-14 NOTE — Telephone Encounter (Signed)
Transmission reviewed. Presenting rhythm: AsVp 104bpm. <1% AT/AF burden, max duration 2hrs (04/24/17). HR>100bpm ~28.6% of the time. Stable device function.   Informed patient that she is not in AF, although her HR is elevated. I informed her that I would send information to Dr.Turner. Patient verbalized understanding and appreciation of information.  Will inform Dr.Turner and Edythe Lynn, RN of device findings.

## 2017-05-16 ENCOUNTER — Ambulatory Visit (HOSPITAL_COMMUNITY)
Admission: RE | Admit: 2017-05-16 | Discharge: 2017-05-16 | Disposition: A | Discharge status: Home / Self Care | Payer: Medicare Other | Source: Ambulatory Visit | Attending: Radiology | Admitting: Radiology

## 2017-05-16 ENCOUNTER — Telehealth: Payer: Self-pay | Admitting: Physician Assistant

## 2017-05-16 ENCOUNTER — Encounter: Payer: Self-pay | Admitting: Physician Assistant

## 2017-05-16 ENCOUNTER — Ambulatory Visit (HOSPITAL_COMMUNITY)
Admission: RE | Admit: 2017-05-16 | Discharge: 2017-05-16 | Disposition: A | Discharge status: Home / Self Care | Payer: Medicare Other | Source: Ambulatory Visit | Attending: Thoracic Surgery (Cardiothoracic Vascular Surgery) | Admitting: Thoracic Surgery (Cardiothoracic Vascular Surgery)

## 2017-05-16 DIAGNOSIS — I700 Atherosclerosis of aorta: Secondary | ICD-10-CM | POA: Diagnosis not present

## 2017-05-16 DIAGNOSIS — Z9889 Other specified postprocedural states: Secondary | ICD-10-CM | POA: Diagnosis not present

## 2017-05-16 DIAGNOSIS — R918 Other nonspecific abnormal finding of lung field: Secondary | ICD-10-CM | POA: Insufficient documentation

## 2017-05-16 DIAGNOSIS — J90 Pleural effusion, not elsewhere classified: Secondary | ICD-10-CM | POA: Diagnosis not present

## 2017-05-16 LAB — BODY FLUID CELL COUNT WITH DIFFERENTIAL
EOS FL: 2 %
Lymphs, Fluid: 65 %
Monocyte-Macrophage-Serous Fluid: 14 % — ABNORMAL LOW (ref 50–90)
NEUTROPHIL FLUID: 19 % (ref 0–25)
WBC FLUID: 846 uL (ref 0–1000)

## 2017-05-16 LAB — LACTATE DEHYDROGENASE, PLEURAL OR PERITONEAL FLUID: LD FL: 128 U/L — AB (ref 3–23)

## 2017-05-16 LAB — PROTEIN, PLEURAL OR PERITONEAL FLUID: Total protein, fluid: <3 g/dL

## 2017-05-16 LAB — GLUCOSE, PLEURAL OR PERITONEAL FLUID: GLUCOSE FL: 101 mg/dL

## 2017-05-16 MED ORDER — LIDOCAINE HCL 2 % IJ SOLN
INTRAMUSCULAR | Status: AC
Start: 2017-05-16 — End: 2017-05-16
  Filled 2017-05-16: qty 10

## 2017-05-16 NOTE — Telephone Encounter (Signed)
New message  Mrs.Mardi Mainland pt daughter verbalized that she is calling for the rn   She has questions if pt should start her coumadin tonight

## 2017-05-16 NOTE — Telephone Encounter (Signed)
Return call to pts daughter, Ms. Taney. She states that her mother is in NSR per a device remote check and wanted to know if needed to be still on Coumadin. I instructed her to restart Coumadin tonight as daughter states Dr Cornelius Moras instructed on Monday that pt could restart after thoracentesis and get further evaluation tomorrow when pt has appt here at office. Daughter agreed and just wanted to know that since pt is in NSR if Coumadin could be discontinued, but will await answer tomorrow.

## 2017-05-16 NOTE — Procedures (Addendum)
Ultrasound-guided diagnostic and therapeutic right thoracentesis performed yielding 1.3 liters of hazy,amber fluid. No immediate complications. Follow-up chest x-ray pending.The fluid was sent to the lab for preordered studies. Only the above amount of fluid was removed today secondary to pt coughing/chest discomfort.

## 2017-05-16 NOTE — Progress Notes (Signed)
Cardiology Office Note    Date:  05/17/2017  ID:  Amanda [name], DOB May 19, 1941, MRN [id number] PCP:  [name], MD  Cardiologist:  [name] EP- [name]   Chief Complaint: f/u valve repair  History of Present Illness:  Amanda [name] is a 76 y.o. female with history of PAC's, second degree AV block s/p St. Jude PPM, PAF on pacer check (now on NOAC for CHADs2VASC score of 4), CKD III, occult stroke by imaging, HTN, history of MVP with recent PVCs and progressive severe mitral regurgitation/CHF due to valvular disease s/p mitral valve repair with MAZE procedure who presents for post-hospital f/u.  To recap history, stress test in 2015 was normal. Echo in June 2018 showed severe posterior leaflet MVP with at least moderate MR, EF 55-60%, grade 2DD, mild-mod TR. She had a TEE done but the probe could not be passed. She was referred to GI and a barium swallow was done which showed nonspecific esophageal dysmotility with a smooth narrowing of the lower esophageal mucosal ring and 31mm barium pill would not pass into stomach.  She underwent esophageal dilatation by GI and was cleared for TEE. When seen back in follow-up 03/05/17 by [name], she was having significant increase in DOE and chest heaviness. She was also having increased PVCs. PVC burden was shown to be 21%, up from 2% in July. F/u with EP was arranged. Subsequent TEE confirmed significant mitral valve disease. Cardiac cath 03/26/17 showed severe MR, normal coronary arteries. She underwent mitral valve repair with MAZE procedure on 04/16/17. Postoperatively she had 150-250 mL of chest tube output per hour and underwent reexploration of right mini thoracotomy for any bleeding same date 04/16/2017 with no findings of reaccumulated blood with ongoing bleeding. She was transitioned to Coumadin. Post-op course otherwise was notable for delirium (with brain imaging only showing old stroke), thrush, AKI felt due to azotemia and ATN, and  elevated LFTs. Per TCTS f/u office note, amiodarone was stopped due to concerns that it might be affecting her appetite but I do not see further details about this in the hospital chart. It does not appear that atrial fib was a significant issue during her stay. She was not on this prior to admission.   She was placed on Augmentin by PCP for UTI on 05/10/17. She saw Dr. Cornelius Moras on 05/13/17 at which time she was found to have a moderate right pleural effusion thus thoracentesis was arranged. There was some concern that she might be in atrial flutter due to elevated HR, but remote transmission showed elevated baseline HR > 100 28% of the time, AsVp, <1% AT/AF burden. Atenolol was doubled. Thoracentesis yesterday yielded 1.3L hazy amber fluid. Otherwise most recent labs showed K 3.3, Cr 1.44, albumin 2.2, AST 35, ALT 60 (elevated LFTs during stay, no imaging), TBili as high as 6.0, Hgb 11.8, WBC peak 21.  CXR 11/5 suggested consolidation in the right middle and lower lobes. CXR 11/8 following thoracentesis showed persistent R basilar atelectasis or infiltrate.  She returns for follow-up today with her daughter and husband. She actually reports feeling better day by day. She continues to have mild dyspnea but it is improving. She has noticed a dry hacky nonproductive cough since discharge. No fevers or chills. No orthopnea, LEE, bleeding, or chest pain. Pulse ox is normal in clinic today along with temp. Blood pressure is elevated but she states she's been "running around all day."   Past Medical History:  Diagnosis Date  .  Allergic rhinitis   . Anemia   . CKD (chronic kidney disease), stage III (HCC)    stage III  . DJD (degenerative joint disease)   . Glomerulonephritis    Dr [name]  . Hyperlipidemia   . Hyperparathyroidism (HCC)   . Hypertension   . Hypothyroidism   . IBS (irritable bowel syndrome)   . Interstitial cystitis   . Mitral regurgitation   . Mobitz II    a. s/p STJ dual chamber PPM     . MVP (mitral valve prolapse)    moderate posterior MVP with moderate MR and grade II diasotlic dysfunction  . PAC (premature atrial contraction)   . PAF (paroxysmal atrial fibrillation) (HCC)     note on pacer check. CHADS2VASC score is 4 now on Eliquis.  [name] PVC's (premature ventricular contractions)   . S/P minimally invasive maze operation for atrial fibrillation 04/16/2017   Complete bilateral atrial lesion set using cryothermy and bipolar radiofrequency ablation with clipping of LA appendage via right mini thoracotomy approach  . S/P minimally invasive mitral valve repair 04/16/2017   Complex valvuloplasty including triangular resection of posterior leaflet, artificial Gore-tex neochords x6 and Sorin Memo 3D ring annuloplasty ([id number], size 32, serial # [id number])  . S/P placement of cardiac pacemaker   . Small vessel disease, cerebrovascular     Past Surgical History:  Procedure Laterality Date  . ABDOMINAL HYSTERECTOMY    . BREAST BIOPSY Right   . CYSTOSTOMY W/ BLADDER BIOPSY    . TONSILLECTOMY      Current Medications: Current Meds  Medication Sig  . Amoxicillin-Pot Clavulanate (AUGMENTIN PO) Take 1 tablet 2 (two) times daily by mouth.  . conjugated estrogens (PREMARIN) vaginal cream Place 1 Applicatorful vaginally every 3 (three) days.   . fluticasone (FLONASE) 50 MCG/ACT nasal spray Place 2 sprays into both nostrils daily.  [name] levothyroxine (SYNTHROID, LEVOTHROID) 75 MCG tablet Take 1 tablet (75 mcg total) by mouth See admin instructions. Take 75 mcg by mouth daily around 0300  . Probiotic Product (PROBIOTIC DAILY PO) Take 1 tablet by mouth daily.  . QUEtiapine (SEROQUEL) 50 MG tablet Take 1 tablet (50 mg total) by mouth at bedtime.  [name] warfarin (COUMADIN) 1 MG tablet Take 1 tablet (1 mg total) by mouth daily.  . [DISCONTINUED] atenolol (TENORMIN) 50 MG tablet Take 50 mg 2 (two) times daily by mouth.     Allergies:   Pepcid [famotidine]; Allopurinol; Amlodipine; Colchicine;  Contrast media [iodinated diagnostic agents]; Prilosec [omeprazole]; Atorvastatin; Cephalexin; Ciprofloxacin; Erythromycin; Sulfonamide derivatives; and Tetracycline   Social History   Socioeconomic History  . Marital status: Married    Spouse name: None  . Number of children: 2  . Years of education: None  . Highest education level: None  Social Needs  . Financial resource strain: None  . Food insecurity - worry: None  . Food insecurity - inability: None  . Transportation needs - medical: None  . Transportation needs - non-medical: None  Occupational History  . Occupation: retired  Tobacco Use  . Smoking status: Former Games developer  . Smokeless tobacco: Never Used  Substance and Sexual Activity  . Alcohol use: No  . Drug use: No  . Sexual activity: None  Other Topics Concern  . None  Social History Narrative   Patient drinks 1-2 cups of caffeine daily.   Patient is right handed.      Family History:  Family History  Problem Relation Age of Onset  . Heart disease Mother   .  Stroke Mother   . Hypertension Mother   . Heart disease Father   . Heart attack Father   . Ovarian cancer Sister   . Heart disease Brother   . Rheum arthritis Sister   . Melanoma Brother   . Brain cancer Brother   . Heart attack Brother   . Hypertension Brother   . Hypertension Sister   . Colon cancer Neg Hx    ROS:   Please see the history of present illness. All other systems are reviewed and otherwise negative.    PHYSICAL EXAM:   VS:  BP (!) 150/88   Pulse 100   Temp 98.6 F (37 C)   Ht 5' 6.5" (1.689 m)   Wt 140 lb (63.5 kg)   SpO2 97%   BMI 22.26 kg/m   BMI: Body mass index is 22.26 kg/m. GEN: Well nourished, well developed WF, in no acute distress  HEENT: normocephalic, atraumatic Neck: no JVD, carotid bruits, or masses Cardiac: RRR; no murmurs, rubs, or gallops, no edema  Respiratory: right middle lung crackles, good aeration throughout, no wheezes or rhonchi clear to  auscultation bilaterally, normal work of breathing GI: soft, nontender, nondistended, + BS MS: no deformity or atrophy  Skin: warm and dry, no rash. No complication at thoracentesis site Neuro:  Alert and Oriented x 3, Strength and sensation are intact, follows commands Psych: euthymic mood, full affect  Wt Readings from Last 3 Encounters:  05/17/17 140 lb (63.5 kg)  05/13/17 142 lb (64.4 kg)  05/04/17 141 lb 1.5 oz (64 kg)      Studies/Labs Reviewed:   EKG:  EKG was ordered today and personally reviewed by me and demonstrates atrial sensed, v paced rhythm 100bpm  Recent Labs: 03/05/2017: NT-Pro BNP 2,335 04/19/2017: TSH 1.488 04/22/2017: Magnesium 1.9 04/29/2017: ALT 60; BUN 35; Creatinine, Ser 1.44; Hemoglobin 11.8; Platelets 192; Potassium 3.3; Sodium 135   Lipid Panel    Component Value Date/Time   CHOL 153 01/07/2015 0304   TRIG 197 (H) 04/22/2017 0419   HDL 51 01/07/2015 0304   CHOLHDL 3.0 01/07/2015 0304   VLDL 13 01/07/2015 0304   LDLCALC 89 01/07/2015 0304    Additional studies/ records that were reviewed today include: Summarized above.    ASSESSMENT & PLAN:   1. Possible healthcare-associated pneumonia - she was recently noted to have sinus tach at OV 11/5. CXR at that time showed pleural effusion as well as R lung consolidation. She has a mild hacky cough. F/u CXR yesterday also demonstrated persistent consolidation. I am concerned that her sinus tach represents a reactive process to a possible persistent pneumonia. She is nontoxic appearing in clinic today, with normal temperature and pulse ox. I discussed her case with Dr. Clifton James (doctor of the day) who agrees that treatment should be considered. Her multiple medication allergies make this extremely challenging. She states it has been a long time since she has been tried on these medications but believes the allergies are accurate (Cephalexin, Ciprofloxacin, Erythromycin, Sulfonamides, Tetracycline). I  discussed the case with pharmacist in clinic today. The Augmentin that she is on, though not ideal for HAP, could be providing at least some coverage for her pneumonia and they recommend continuing for now for at least 10 days. This was the intended course from her primary care (for unrelated issue of UTI). They felt a 4th generation fluoroquinolone could be entertained due to possibility of less cross-reactivity, but this would need to be considered under monitored circumstances. As she  actually appears nontoxic and clinically improving, will continue Augmentin and obtain stat CBC. If her labs demonstrate persistent significant leukocytosis, I told her we may have her present to the ED for possible initiation of antibiotics with monitoring given h/o allergic reactions. If this appears improved from prior, I advised close f/u with PCP within 3 days for further monitoring of possible pneumonia. She was agreeable. Warning sx discussed. 2. Sinus tachycardia - recommended max dose of atenolol with her GFR is 50mg  daily. Given her CKD, will change her from atenolol 50mg  BID to equivalent dose of metoprolol 100mg  BID. Add TSH to labs and recheck CBC/CMET as above. 3. Severe MR s/p mitral valve repair - appears to be healing well. Has f/u 05/27/17 with Dr. Cornelius Moraswen. 4. Right pleural effusion - lungs clear except for crackles. Follow clinically. 5. CKD stage III - recheck Cr today. 6. Paroxysmal atrial fib - maintaining NSR. Now on Coumadin given h/o valvular surgery. Have discussed with Coumadin clinic and they will continue to follow her via home health. She resumed Coumadin last night. 7. HTN - follow BP with above med change. If needed at f/u visit, may need to consider adding amlodipine. 8. Elevated LFTs in the hospital - ? r/t acute illness/surgery at the time. Recheck.  Disposition: F/u with Dr. Eugenie Fillerurner/care team APP 7-10 days for HR recheck.  Medication Adjustments/Labs and Tests Ordered: Current medicines  are reviewed at length with the patient today.  Concerns regarding medicines are outlined above. Medication changes, Labs and Tests ordered today are summarized above and listed in the Patient Instructions accessible in Encounters.   Signed, Laurann Montanaayna N Aimy Sweeting, PA-C  05/17/2017 10:30 AM    [facility] [address], [place], [place]  [address] Phone: [phone]; Fax: [phone]

## 2017-05-17 ENCOUNTER — Ambulatory Visit: Payer: Medicare Other | Admitting: Physician Assistant

## 2017-05-17 ENCOUNTER — Telehealth: Payer: Self-pay | Admitting: Physician Assistant

## 2017-05-17 ENCOUNTER — Encounter: Payer: Self-pay | Admitting: Physician Assistant

## 2017-05-17 VITALS — BP 150/88 | HR 100 | Temp 98.6°F | Ht 66.5 in | Wt 140.0 lb

## 2017-05-17 DIAGNOSIS — J90 Pleural effusion, not elsewhere classified: Secondary | ICD-10-CM | POA: Diagnosis not present

## 2017-05-17 DIAGNOSIS — R7989 Other specified abnormal findings of blood chemistry: Secondary | ICD-10-CM

## 2017-05-17 DIAGNOSIS — N183 Chronic kidney disease, stage 3 unspecified: Secondary | ICD-10-CM

## 2017-05-17 DIAGNOSIS — I480 Paroxysmal atrial fibrillation: Secondary | ICD-10-CM

## 2017-05-17 DIAGNOSIS — R945 Abnormal results of liver function studies: Secondary | ICD-10-CM | POA: Diagnosis not present

## 2017-05-17 DIAGNOSIS — R000 Tachycardia, unspecified: Secondary | ICD-10-CM

## 2017-05-17 DIAGNOSIS — J189 Pneumonia, unspecified organism: Secondary | ICD-10-CM | POA: Diagnosis not present

## 2017-05-17 DIAGNOSIS — I340 Nonrheumatic mitral (valve) insufficiency: Secondary | ICD-10-CM

## 2017-05-17 DIAGNOSIS — Z9889 Other specified postprocedural states: Secondary | ICD-10-CM

## 2017-05-17 LAB — CBC
Hematocrit: 35.9 % (ref 34.0–46.6)
Hemoglobin: 12.5 g/dL (ref 11.1–15.9)
MCH: 29.7 pg (ref 26.6–33.0)
MCHC: 34.8 g/dL (ref 31.5–35.7)
MCV: 85 fL (ref 79–97)
PLATELETS: 244 10*3/uL (ref 150–379)
RBC: 4.21 x10E6/uL (ref 3.77–5.28)
RDW: 15.1 % (ref 12.3–15.4)
WBC: 10.8 10*3/uL (ref 3.4–10.8)

## 2017-05-17 LAB — CBC WITH DIFFERENTIAL/PLATELET
BASOS ABS: 0 10*3/uL (ref 0.0–0.2)
Basos: 0 %
EOS (ABSOLUTE): 0.4 10*3/uL (ref 0.0–0.4)
Eos: 4 %
HEMOGLOBIN: 12.5 g/dL (ref 11.1–15.9)
Hematocrit: 35.9 % (ref 34.0–46.6)
Lymphocytes Absolute: 2.4 10*3/uL (ref 0.7–3.1)
Lymphs: 22 %
MCH: 29.7 pg (ref 26.6–33.0)
MCHC: 34.8 g/dL (ref 31.5–35.7)
MCV: 85 fL (ref 79–97)
MONOS ABS: 0.9 10*3/uL (ref 0.1–0.9)
Monocytes: 9 %
NEUTROS PCT: 65 %
Neutrophils Absolute: 7.1 10*3/uL — ABNORMAL HIGH (ref 1.4–7.0)
PLATELETS: 244 10*3/uL (ref 150–379)
RBC: 4.21 x10E6/uL (ref 3.77–5.28)
RDW: 15.1 % (ref 12.3–15.4)
WBC: 10.8 10*3/uL (ref 3.4–10.8)

## 2017-05-17 LAB — COMPREHENSIVE METABOLIC PANEL
ALBUMIN: 3.4 g/dL — AB (ref 3.5–4.8)
ALK PHOS: 107 IU/L (ref 39–117)
ALT: 13 IU/L (ref 0–32)
AST: 19 IU/L (ref 0–40)
Albumin/Globulin Ratio: 1.1 — ABNORMAL LOW (ref 1.2–2.2)
BILIRUBIN TOTAL: 0.7 mg/dL (ref 0.0–1.2)
BUN / CREAT RATIO: 10 — AB (ref 12–28)
BUN: 12 mg/dL (ref 8–27)
CHLORIDE: 100 mmol/L (ref 96–106)
CO2: 30 mmol/L — ABNORMAL HIGH (ref 20–29)
Calcium: 9.6 mg/dL (ref 8.7–10.3)
Creatinine, Ser: 1.23 mg/dL — ABNORMAL HIGH (ref 0.57–1.00)
GFR calc Af Amer: 49 mL/min/{1.73_m2} — ABNORMAL LOW (ref >59)
GFR calc non Af Amer: 42 mL/min/{1.73_m2} — ABNORMAL LOW (ref >59)
GLOBULIN, TOTAL: 3 g/dL (ref 1.5–4.5)
GLUCOSE: 105 mg/dL — AB (ref 65–99)
Potassium: 3.6 mmol/L (ref 3.5–5.2)
SODIUM: 138 mmol/L (ref 134–144)
Total Protein: 6.4 g/dL (ref 6.0–8.5)

## 2017-05-17 LAB — TSH: TSH: 2.01 u[IU]/mL (ref 0.450–4.500)

## 2017-05-17 MED ORDER — METOPROLOL TARTRATE 100 MG PO TABS: 100.0000 mg | ORAL_TABLET | Freq: Two times a day (BID) | ORAL | 3 refills | Status: DC

## 2017-05-17 NOTE — Telephone Encounter (Signed)
Returned [place], [place] on file, call.  Voicemail was full and couldn't leave a message.

## 2017-05-17 NOTE — Patient Instructions (Addendum)
Medication Instructions:  Your physician has recommended you make the following change in your medication:  1.  STOP the Atenolol 2.  START Metoprolol 100 mg taking 1 tablet twice daily   Labwork: TODAY:  STAT CMET & CBC, & TSH  Testing/Procedures: None ordered  Follow-Up: Your physician recommends that you schedule a follow-up appointment in: 7-10 DAYS WITH [name] OR CARE TEAM APP   Any Other Special Instructions Will Be Listed Below (If Applicable).    If you need a refill on your cardiac medications before your next appointment, please call your pharmacy.

## 2017-05-17 NOTE — Telephone Encounter (Signed)
Patient daughter Langley Gauss) calling, states that she would like to discuss the appointment today. Mrs. Mardi Mainland states that her mother could not tell her everything from the appt.

## 2017-05-19 LAB — BODY FLUID CULTURE
Culture: NO GROWTH
SPECIAL REQUESTS: NORMAL

## 2017-05-20 ENCOUNTER — Ambulatory Visit (INDEPENDENT_AMBULATORY_CARE_PROVIDER_SITE_OTHER): Payer: Medicare Other | Admitting: *Deleted

## 2017-05-20 DIAGNOSIS — I442 Atrioventricular block, complete: Secondary | ICD-10-CM | POA: Diagnosis not present

## 2017-05-20 NOTE — Telephone Encounter (Signed)
Follow Up:   Please call Amanda [name],concerning pt's last office visit.

## 2017-05-20 NOTE — Progress Notes (Signed)
Remote pacemaker transmission.   

## 2017-05-20 NOTE — Telephone Encounter (Signed)
Returned [employer], [place] on file. She just wanted to make sure of the appt that the pt had with Ronie Spies, PA-C, that Dayna wanted her to f/u with her pcp for Pnuemonia. She has been advised that she was correct. She thanked me for the call back.

## 2017-05-22 ENCOUNTER — Inpatient Hospital Stay (HOSPITAL_COMMUNITY)
Admission: EM | Admit: 2017-05-22 | Discharge: 2017-05-24 | DRG: 188 | Disposition: A | Discharge status: Home Health | Payer: Medicare Other | Attending: Thoracic Surgery (Cardiothoracic Vascular Surgery) | Admitting: Thoracic Surgery (Cardiothoracic Vascular Surgery)

## 2017-05-22 ENCOUNTER — Telehealth: Payer: Self-pay | Admitting: Cardiology

## 2017-05-22 ENCOUNTER — Other Ambulatory Visit: Payer: Self-pay

## 2017-05-22 ENCOUNTER — Encounter (HOSPITAL_COMMUNITY): Payer: Self-pay | Admitting: Emergency Medicine

## 2017-05-22 ENCOUNTER — Emergency Department (HOSPITAL_COMMUNITY): Payer: Medicare Other

## 2017-05-22 DIAGNOSIS — Z87891 Personal history of nicotine dependence: Secondary | ICD-10-CM | POA: Diagnosis not present

## 2017-05-22 DIAGNOSIS — Z91041 Radiographic dye allergy status: Secondary | ICD-10-CM | POA: Diagnosis not present

## 2017-05-22 DIAGNOSIS — Z888 Allergy status to other drugs, medicaments and biological substances status: Secondary | ICD-10-CM | POA: Diagnosis not present

## 2017-05-22 DIAGNOSIS — J918 Pleural effusion in other conditions classified elsewhere: Principal | ICD-10-CM | POA: Diagnosis present

## 2017-05-22 DIAGNOSIS — I480 Paroxysmal atrial fibrillation: Secondary | ICD-10-CM | POA: Diagnosis present

## 2017-05-22 DIAGNOSIS — K589 Irritable bowel syndrome without diarrhea: Secondary | ICD-10-CM | POA: Diagnosis present

## 2017-05-22 DIAGNOSIS — Z8249 Family history of ischemic heart disease and other diseases of the circulatory system: Secondary | ICD-10-CM

## 2017-05-22 DIAGNOSIS — I129 Hypertensive chronic kidney disease with stage 1 through stage 4 chronic kidney disease, or unspecified chronic kidney disease: Secondary | ICD-10-CM | POA: Diagnosis present

## 2017-05-22 DIAGNOSIS — Z8679 Personal history of other diseases of the circulatory system: Secondary | ICD-10-CM

## 2017-05-22 DIAGNOSIS — N183 Chronic kidney disease, stage 3 (moderate): Secondary | ICD-10-CM | POA: Diagnosis not present

## 2017-05-22 DIAGNOSIS — I10 Essential (primary) hypertension: Secondary | ICD-10-CM | POA: Diagnosis present

## 2017-05-22 DIAGNOSIS — Z419 Encounter for procedure for purposes other than remedying health state, unspecified: Secondary | ICD-10-CM

## 2017-05-22 DIAGNOSIS — Z8673 Personal history of transient ischemic attack (TIA), and cerebral infarction without residual deficits: Secondary | ICD-10-CM | POA: Diagnosis not present

## 2017-05-22 DIAGNOSIS — Z9889 Other specified postprocedural states: Secondary | ICD-10-CM

## 2017-05-22 DIAGNOSIS — E039 Hypothyroidism, unspecified: Secondary | ICD-10-CM | POA: Diagnosis present

## 2017-05-22 DIAGNOSIS — J90 Pleural effusion, not elsewhere classified: Secondary | ICD-10-CM | POA: Diagnosis present

## 2017-05-22 LAB — CUP PACEART REMOTE DEVICE CHECK
Brady Statistic AP VP Percent: 20 %
Brady Statistic AS VP Percent: 74 %
Brady Statistic RA Percent Paced: 20 %
Date Time Interrogation Session: 20181112081739
Implantable Lead Implant Date: 20160701
Implantable Lead Location: 753859
Implantable Lead Location: 753860
Lead Channel Impedance Value: 530 Ohm
Lead Channel Pacing Threshold Pulse Width: 0.4 ms
Lead Channel Sensing Intrinsic Amplitude: 12 mV
Lead Channel Setting Pacing Amplitude: 2 V
MDC IDC LEAD IMPLANT DT: 20160701
MDC IDC MSMT BATTERY REMAINING LONGEVITY: 107 mo
MDC IDC MSMT BATTERY REMAINING PERCENTAGE: 95.5 %
MDC IDC MSMT BATTERY VOLTAGE: 3.01 V
MDC IDC MSMT LEADCHNL RA IMPEDANCE VALUE: 340 Ohm
MDC IDC MSMT LEADCHNL RA PACING THRESHOLD AMPLITUDE: 0.75 V
MDC IDC MSMT LEADCHNL RA SENSING INTR AMPL: 2.1 mV
MDC IDC MSMT LEADCHNL RV PACING THRESHOLD AMPLITUDE: 1.125 V
MDC IDC MSMT LEADCHNL RV PACING THRESHOLD PULSEWIDTH: 0.4 ms
MDC IDC PG IMPLANT DT: 20160701
MDC IDC SET LEADCHNL RV PACING AMPLITUDE: 1.375
MDC IDC SET LEADCHNL RV PACING PULSEWIDTH: 0.4 ms
MDC IDC SET LEADCHNL RV SENSING SENSITIVITY: 2 mV
MDC IDC STAT BRADY AP VS PERCENT: 1 %
MDC IDC STAT BRADY AS VS PERCENT: 1.1 %
MDC IDC STAT BRADY RV PERCENT PACED: 94 %
Pulse Gen Model: 2240
Pulse Gen Serial Number: 7785935

## 2017-05-22 LAB — URINALYSIS, ROUTINE W REFLEX MICROSCOPIC
BACTERIA UA: NONE SEEN
Bilirubin Urine: NEGATIVE
Glucose, UA: NEGATIVE mg/dL
Ketones, ur: NEGATIVE mg/dL
Leukocytes, UA: NEGATIVE
NITRITE: NEGATIVE
PROTEIN: NEGATIVE mg/dL
SQUAMOUS EPITHELIAL / LPF: NONE SEEN
Specific Gravity, Urine: 1.008 (ref 1.005–1.030)
pH: 6 (ref 5.0–8.0)

## 2017-05-22 LAB — PROTIME-INR
INR: 1.67
PROTHROMBIN TIME: 19.5 s — AB (ref 11.4–15.2)

## 2017-05-22 LAB — BASIC METABOLIC PANEL
ANION GAP: 8 (ref 5–15)
BUN: 10 mg/dL (ref 6–20)
CALCIUM: 9.1 mg/dL (ref 8.9–10.3)
CO2: 28 mmol/L (ref 22–32)
Chloride: 101 mmol/L (ref 101–111)
Creatinine, Ser: 1.23 mg/dL — ABNORMAL HIGH (ref 0.44–1.00)
GFR calc Af Amer: 47 mL/min — ABNORMAL LOW (ref >60)
GFR calc non Af Amer: 41 mL/min — ABNORMAL LOW (ref >60)
GLUCOSE: 188 mg/dL — AB (ref 65–99)
Potassium: 3.3 mmol/L — ABNORMAL LOW (ref 3.5–5.1)
Sodium: 137 mmol/L (ref 135–145)

## 2017-05-22 LAB — CBC
HCT: 37.7 % (ref 36.0–46.0)
Hemoglobin: 12.2 g/dL (ref 12.0–15.0)
MCH: 29.6 pg (ref 26.0–34.0)
MCHC: 32.4 g/dL (ref 30.0–36.0)
MCV: 91.5 fL (ref 78.0–100.0)
Platelets: 244 10*3/uL (ref 150–400)
RBC: 4.12 MIL/uL (ref 3.87–5.11)
RDW: 15.5 % (ref 11.5–15.5)
WBC: 9.8 10*3/uL (ref 4.0–10.5)

## 2017-05-22 LAB — DIFFERENTIAL
BASOS ABS: 0 10*3/uL (ref 0.0–0.1)
Basophils Relative: 0 %
Eosinophils Absolute: 0.5 10*3/uL (ref 0.0–0.7)
Eosinophils Relative: 5 %
LYMPHS PCT: 23 %
Lymphs Abs: 2.3 10*3/uL (ref 0.7–4.0)
MONO ABS: 0.9 10*3/uL (ref 0.1–1.0)
Monocytes Relative: 9 %
NEUTROS ABS: 6.4 10*3/uL (ref 1.7–7.7)
Neutrophils Relative %: 63 %

## 2017-05-22 LAB — COMPREHENSIVE METABOLIC PANEL
ALT: 16 U/L (ref 14–54)
ANION GAP: 8 (ref 5–15)
AST: 29 U/L (ref 15–41)
Albumin: 3.1 g/dL — ABNORMAL LOW (ref 3.5–5.0)
Alkaline Phosphatase: 94 U/L (ref 38–126)
BILIRUBIN TOTAL: 0.9 mg/dL (ref 0.3–1.2)
BUN: 8 mg/dL (ref 6–20)
CO2: 28 mmol/L (ref 22–32)
Calcium: 9.4 mg/dL (ref 8.9–10.3)
Chloride: 103 mmol/L (ref 101–111)
Creatinine, Ser: 1.18 mg/dL — ABNORMAL HIGH (ref 0.44–1.00)
GFR, EST AFRICAN AMERICAN: 50 mL/min — AB (ref >60)
GFR, EST NON AFRICAN AMERICAN: 43 mL/min — AB (ref >60)
GLUCOSE: 106 mg/dL — AB (ref 65–99)
POTASSIUM: 3.5 mmol/L (ref 3.5–5.1)
Sodium: 139 mmol/L (ref 135–145)
Total Protein: 6.7 g/dL (ref 6.5–8.1)

## 2017-05-22 LAB — TYPE AND SCREEN
ABO/RH(D): A POS
ANTIBODY SCREEN: NEGATIVE

## 2017-05-22 LAB — APTT: APTT: 39 s — AB (ref 24–36)

## 2017-05-22 MED ORDER — SODIUM CHLORIDE 0.9 % IV SOLN: 250.0000 mL | INTRAVENOUS | Status: DC | PRN

## 2017-05-22 MED ORDER — ACETAMINOPHEN 325 MG PO TABS: 650.0000 mg | ORAL_TABLET | Freq: Four times a day (QID) | ORAL | Status: DC | PRN

## 2017-05-22 MED ORDER — LEVOTHYROXINE SODIUM 75 MCG PO TABS
75.0000 ug | ORAL_TABLET | Freq: Every day | ORAL | Status: DC
  Administered 2017-05-23 – 2017-05-24 (×2): 75 ug via ORAL
  Filled 2017-05-22 (×2): qty 1

## 2017-05-22 MED ORDER — VANCOMYCIN HCL IN DEXTROSE 1-5 GM/200ML-% IV SOLN: 1000.0000 mg | INTRAVENOUS | Status: DC

## 2017-05-22 MED ORDER — METOPROLOL TARTRATE 100 MG PO TABS
100.0000 mg | ORAL_TABLET | Freq: Two times a day (BID) | ORAL | Status: DC
  Administered 2017-05-22 – 2017-05-24 (×4): 100 mg via ORAL
  Filled 2017-05-22 (×4): qty 1

## 2017-05-22 MED ORDER — SODIUM CHLORIDE 0.9% FLUSH
3.0000 mL | Freq: Two times a day (BID) | INTRAVENOUS | Status: DC
  Administered 2017-05-23 – 2017-05-24 (×2): 3 mL via INTRAVENOUS

## 2017-05-22 MED ORDER — SODIUM CHLORIDE 0.9% FLUSH: 3.0000 mL | INTRAVENOUS | Status: DC | PRN

## 2017-05-22 MED ORDER — QUETIAPINE FUMARATE 25 MG PO TABS
50.0000 mg | ORAL_TABLET | Freq: Every day | ORAL | Status: DC
  Administered 2017-05-22 – 2017-05-23 (×2): 50 mg via ORAL
  Filled 2017-05-22 (×2): qty 1
  Filled 2017-05-22 (×2): qty 2

## 2017-05-22 NOTE — ED Provider Notes (Signed)
Emergency Department Provider Note   I have reviewed the triage vital signs and the nursing notes.   HISTORY  Chief Complaint Shortness of Breath   HPI Amanda [name] is a 76 y.o. female w/ recent mitral valve repair by pneumonia and pleural effusion status post thoracentesis who really returns with another pleural effusion.  Patient states that it is recurred with worsening symptoms over the last week or so when she saw her primary doctor who did a chest x-ray and sent her here for evaluation.  Plan to see Dr. Freida Busman for likely admission.  No fevers.  No other associated modifying symptoms.  Similar symptoms to her last pleural effusion.  Has not tried anything for the symptoms up until now.  Past Medical History:  Diagnosis Date  . Allergic rhinitis   . Anemia   . CKD (chronic kidney disease), stage III (HCC)    stage III  . DJD (degenerative joint disease)   . Glomerulonephritis    Dr [name]  . Hyperlipidemia   . Hyperparathyroidism (HCC)   . Hypertension   . Hypothyroidism   . IBS (irritable bowel syndrome)   . Interstitial cystitis   . Mitral regurgitation   . Mobitz II    a. s/p STJ dual chamber PPM   . MVP (mitral valve prolapse)    moderate posterior MVP with moderate MR and grade II diasotlic dysfunction  . PAC (premature atrial contraction)   . PAF (paroxysmal atrial fibrillation) (HCC)     note on pacer check. CHADS2VASC score is 4 now on Eliquis.  [name] PVC's (premature ventricular contractions)   . S/P minimally invasive maze operation for atrial fibrillation 04/16/2017   Complete bilateral atrial lesion set using cryothermy and bipolar radiofrequency ablation with clipping of LA appendage via right mini thoracotomy approach  . S/P minimally invasive mitral valve repair 04/16/2017   Complex valvuloplasty including triangular resection of posterior leaflet, artificial Gore-tex neochords x6 and Sorin Memo 3D ring annuloplasty ([id number], size 32, serial # [id number])   . S/P placement of cardiac pacemaker   . Small vessel disease, cerebrovascular   . Stroke [facility])    a. old stroke seen on imaging.    Patient Active Problem List   Diagnosis Date Noted  . Pleural effusion on right 05/22/2017  . Pleural effusion 05/22/2017  . Chronic anticoagulation   . Benign essential HTN   . Oral thrush   . Subtherapeutic anticoagulation   . Sternal pain   . SOB (shortness of breath)   . Stage 3 chronic kidney disease (HCC)   . Debility 04/26/2017  . History of CVA (cerebrovascular accident)   . S/P placement of cardiac pacemaker   . Post-operative pain   . Labile blood glucose   . Hypernatremia   . S/P minimally invasive mitral valve repair + maze procedure 04/16/2017  . S/P minimally invasive maze operation for atrial fibrillation 04/16/2017  . DOE (dyspnea on exertion) 03/05/2017  . Paroxysmal atrial fibrillation (HCC)   . Acute delirium 04/28/2015  . Acute UTI 04/01/2015  . Acute encephalopathy 04/01/2015  . AKI (acute kidney injury) (HCC) 04/01/2015  . Leukocytosis 04/01/2015  . Thrombocytopenia (HCC) 04/01/2015  . Hyponatremia 04/01/2015  . Hypothyroidism 04/01/2015  . CKD (chronic kidney disease), stage III (HCC) 04/01/2015  . UTI (lower urinary tract infection)   . Severe sepsis without septic shock (HCC)   . Pacemaker 02/03/2015  . AV block 01/06/2015  . RBBB 05/21/2014  . Mitral regurgitation 11/26/2013  .  PAC (premature atrial contraction)   . DIARRHEA 07/24/2010  . GLOMERULONEPHRITIS 03/27/2010  . COLITIS 10/06/2009  . Essential hypertension 10/05/2009  . DYSPHAGIA UNSPECIFIED 10/05/2009  . Personal history of other endocrine, metabolic, and immunity disorders 10/05/2009    Past Surgical History:  Procedure Laterality Date  . ABDOMINAL HYSTERECTOMY    . BREAST BIOPSY Right   . CYSTOSTOMY W/ BLADDER BIOPSY    . TONSILLECTOMY      Current Outpatient Rx  . Order #: [id number] Class: Historical Med  . Order #: [id number] Class:  Historical Med  . Order #: [id number] Class: Print  . Order #: [id number] Class: Normal  . Order #: [id number] Class: Historical Med  . Order #: [id number] Class: No Print  . Order #: [id number] Class: Print    Allergies Pepcid [famotidine]; Allopurinol; Amlodipine; Colchicine; Contrast media [iodinated diagnostic agents]; Prilosec [omeprazole]; Atorvastatin; Cephalexin; Ciprofloxacin; Erythromycin; Sulfonamide derivatives; and Tetracycline  Family History  Problem Relation Age of Onset  . Heart disease Mother   . Stroke Mother   . Hypertension Mother   . Heart disease Father   . Heart attack Father   . Ovarian cancer Sister   . Heart disease Brother   . Rheum arthritis Sister   . Melanoma Brother   . Brain cancer Brother   . Heart attack Brother   . Hypertension Brother   . Hypertension Sister   . Colon cancer Neg Hx     Social History Social History   Tobacco Use  . Smoking status: Former Games developermoker  . Smokeless tobacco: Never Used  Substance Use Topics  . Alcohol use: No  . Drug use: No    Review of Systems  All other systems negative except as documented in the HPI. All pertinent positives and negatives as reviewed in the HPI. ____________________________________________   PHYSICAL EXAM:  VITAL SIGNS: ED Triage Vitals  Enc Vitals Group     BP 05/22/17 1549 140/77     Pulse Rate 05/22/17 1549 (!) 106     Resp 05/22/17 1549 (!) 21     Temp 05/22/17 1549 (!) 97.5 F (36.4 C)     Temp Source 05/22/17 1549 Oral     SpO2 05/22/17 1549 95 %     Weight 05/22/17 1547 141 lb (64 kg)     Height 05/22/17 1547 5\' 6"  (1.676 m)    Constitutional: Alert and oriented. Well appearing and in no acute distress. Eyes: Conjunctivae are normal. PERRL. EOMI. Head: Atraumatic. Nose: No congestion/rhinnorhea. Mouth/Throat: Mucous membranes are moist.  Oropharynx non-erythematous. Neck: No stridor.  No meningeal signs.   Cardiovascular: Normal rate, regular rhythm. Good peripheral  circulation. Grossly normal heart sounds.   Respiratory: Normal respiratory effort.  No retractions. Lungs decreased on right. Gastrointestinal: Soft and nontender. No distention.  Musculoskeletal: No lower extremity tenderness nor edema. No gross deformities of extremities. Neurologic:  Normal speech and language. No gross focal neurologic deficits are appreciated.  Skin:  Skin is warm, dry and intact. No rash noted.   ____________________________________________   LABS (all labs ordered are listed, but only abnormal results are displayed)  Labs Reviewed  BASIC METABOLIC PANEL - Abnormal; Notable for the following components:      Result Value   Potassium 3.3 (*)    Glucose, Bld 188 (*)    Creatinine, Ser 1.23 (*)    GFR calc non Af Amer 41 (*)    GFR calc Af Amer 47 (*)    All other components within normal limits  CBC  CBC  COMPREHENSIVE METABOLIC PANEL  BLOOD GAS, ARTERIAL  APTT  URINALYSIS, ROUTINE W REFLEX MICROSCOPIC  PROTIME-INR  PROTIME-INR  CBC  BASIC METABOLIC PANEL  TYPE AND SCREEN   ____________________________________________  EKG   EKG Interpretation  Date/Time:  Wednesday May 22 2017 15:47:28 EST Ventricular Rate:  103 PR Interval:    QRS Duration: 126 QT Interval:  406 QTC Calculation: 531 R Axis:   10 Text Interpretation:  Wide QRS rhythm with occasional Premature ventricular complexes Non-specific intra-ventricular conduction block Cannot rule out Anteroseptal infarct , age undetermined T wave abnormality, consider inferolateral ischemia Abnormal ECG likely still av paced Confirmed by Marily Memos [phone]) on 05/22/2017 5:59:47 PM       ____________________________________________  RADIOLOGY  Dg Chest 2 View  Result Date: 05/22/2017 CLINICAL DATA:  Shortness of breath. EXAM: CHEST  2 VIEW COMPARISON:  Radiographs of May 22, 2017. FINDINGS: Stable cardiomediastinal silhouette. Status post cardiac valve repair. Atherosclerosis of  thoracic aorta is noted. Left-sided pacemaker is unchanged in position. No pneumothorax is noted. Large right pleural effusion is noted with probable underlying atelectasis or infiltrate. Mild left basilar subsegmental atelectasis is noted. Bony thorax is unremarkable. IMPRESSION: Aortic atherosclerosis. Stable large right pleural effusion with probable underlying atelectasis or infiltrate. Electronically Signed   By: Lupita Raider, M.D.   On: 05/22/2017 16:33    ____________________________________________   PROCEDURES  Procedure(s) performed:   Procedures   ____________________________________________   INITIAL IMPRESSION / ASSESSMENT AND PLAN / ED COURSE  Pertinent labs & imaging results that were available during my care of the patient were reviewed by me and considered in my medical decision making (see chart for details).  Repeat pleural effusion. Stable at this time. Dr. Cornelius Moras to admit.    ____________________________________________  FINAL CLINICAL IMPRESSION(S) / ED DIAGNOSES  Final diagnoses:  Recurrent right pleural effusion     MEDICATIONS GIVEN DURING THIS VISIT:  Medications  levothyroxine (SYNTHROID, LEVOTHROID) tablet 75 mcg (not administered)  metoprolol tartrate (LOPRESSOR) tablet 100 mg (not administered)  QUEtiapine (SEROQUEL) tablet 50 mg (not administered)  vancomycin (VANCOCIN) IVPB 1000 mg/200 mL premix (not administered)  sodium chloride flush (NS) 0.9 % injection 3 mL (not administered)  sodium chloride flush (NS) 0.9 % injection 3 mL (not administered)  0.9 %  sodium chloride infusion (not administered)  acetaminophen (TYLENOL) tablet 650 mg (not administered)     NEW OUTPATIENT MEDICATIONS STARTED DURING THIS VISIT:  This SmartLink is deprecated. Use AVSMEDLIST instead to display the medication list for a patient.  Note:  This document was prepared using Dragon voice recognition software and may include unintentional dictation errors.     Marily Memos, MD 05/22/17 [phone]

## 2017-05-22 NOTE — Progress Notes (Signed)
Stuck patient for ABG once without luck. Stated that they were unable to get in the past. Refused to be stuck again. Gershon Crane, [place] aware.

## 2017-05-22 NOTE — Telephone Encounter (Signed)
Dr [name] spoke with this pts PCP, and advised she should call Dr Cornelius Moras at [facility], for he recently did a procedure and last saw the pt.  Dr [name] also advised the PCP that she should consider sending the pt to the ER for further work-up.

## 2017-05-22 NOTE — ED Triage Notes (Signed)
Pt went to MD office today and was sent here- concerned she has pneumonia. Pt has had a cough, SOB. Pt recently had mitral valve prolapse repair. Denies CP. Pt also reports she had fluid removed form her lungs at [facility] last week.

## 2017-05-22 NOTE — H&P (Signed)
Subjective:  The patient is a 76 year old female well-known to [facility] having undergone previous minimally invasive mitral valve repair by Dr. Cornelius Moras on 04/16/2017.  She had a complex postoperative course and subsequently spent time in inpatient rehabilitation but has been home for approximately 2 weeks.  Overall she feels as though she is doing quite well.  She did require a right thoracentesis x1 last week.  She saw her primary care physician today and on chest x-ray was found to have reaccumulation of the right-sided pleural effusion.  She was sent to [facility] for further management and is to be readmitted to Dr. Cornelius Moras service for potential further procedures on the recurrent effusion.  She denies fevers, chills or other constitutional symptoms.  She did have a recent 10-day course of antibiotics for a urinary tract infection which she completed.  She is not having any urinary symptoms at this time.  She denies any significant shortness of breath but does feel a fair amount of fatigability and has 3 pillow orthopnea.   Patient Active Problem List   Diagnosis Date Noted  . Pleural effusion on right 05/22/2017  . Chronic anticoagulation   . Benign essential HTN   . Oral thrush   . Subtherapeutic anticoagulation   . Sternal pain   . SOB (shortness of breath)   . Stage 3 chronic kidney disease (HCC)   . Debility 04/26/2017  . History of CVA (cerebrovascular accident)   . S/P placement of cardiac pacemaker   . Post-operative pain   . Labile blood glucose   . Hypernatremia   . S/P minimally invasive mitral valve repair + maze procedure 04/16/2017  . S/P minimally invasive maze operation for atrial fibrillation 04/16/2017  . DOE (dyspnea on exertion) 03/05/2017  . Paroxysmal atrial fibrillation (HCC)   . Acute delirium 04/28/2015  . Acute UTI 04/01/2015  . Acute encephalopathy 04/01/2015  . AKI (acute kidney injury) (HCC) 04/01/2015  . Leukocytosis 04/01/2015  . Thrombocytopenia (HCC) 04/01/2015   . Hyponatremia 04/01/2015  . Hypothyroidism 04/01/2015  . CKD (chronic kidney disease), stage III (HCC) 04/01/2015  . UTI (lower urinary tract infection)   . Severe sepsis without septic shock (HCC)   . Pacemaker 02/03/2015  . AV block 01/06/2015  . RBBB 05/21/2014  . Mitral regurgitation 11/26/2013  . PAC (premature atrial contraction)   . DIARRHEA 07/24/2010  . GLOMERULONEPHRITIS 03/27/2010  . COLITIS 10/06/2009  . Essential hypertension 10/05/2009  . DYSPHAGIA UNSPECIFIED 10/05/2009  . Personal history of other endocrine, metabolic, and immunity disorders 10/05/2009   Past Medical History:  Diagnosis Date  . Allergic rhinitis   . Anemia   . CKD (chronic kidney disease), stage III (HCC)    stage III  . DJD (degenerative joint disease)   . Glomerulonephritis    Dr [name]  . Hyperlipidemia   . Hyperparathyroidism (HCC)   . Hypertension   . Hypothyroidism   . IBS (irritable bowel syndrome)   . Interstitial cystitis   . Mitral regurgitation   . Mobitz II    a. s/p STJ dual chamber PPM   . MVP (mitral valve prolapse)    moderate posterior MVP with moderate MR and grade II diasotlic dysfunction  . PAC (premature atrial contraction)   . PAF (paroxysmal atrial fibrillation) (HCC)     note on pacer check. CHADS2VASC score is 4 now on Eliquis.  [name] PVC's (premature ventricular contractions)   . S/P minimally invasive maze operation for atrial fibrillation 04/16/2017   Complete bilateral atrial  lesion set using cryothermy and bipolar radiofrequency ablation with clipping of LA appendage via right mini thoracotomy approach  . S/P minimally invasive mitral valve repair 04/16/2017   Complex valvuloplasty including triangular resection of posterior leaflet, artificial Gore-tex neochords x6 and Sorin Memo 3D ring annuloplasty ([id number], size 32, serial # [id number])  . S/P placement of cardiac pacemaker   . Small vessel disease, cerebrovascular   . Stroke [facility])    a. old stroke seen on  imaging.    Past Surgical History:  Procedure Laterality Date  . ABDOMINAL HYSTERECTOMY    . BREAST BIOPSY Right   . CYSTOSTOMY W/ BLADDER BIOPSY    . TONSILLECTOMY     Date of Procedure:                            04/16/2017  Preoperative Diagnosis:                  Bleeding s/p Minimally Invasive Mitral Valve Repair  Postoperative Diagnosis:                same  Procedure:                                         Reexploration of right mini-thoracotomy for bleeding  Surgeon:                                            Salvatore Decent. Cornelius Moras, MD  Assistant:                                           Ronn Melena, CRNFA  Anesthesia:                                        Lewie Loron, MD    Date of Procedure:                            04/16/2017  Preoperative Diagnosis:         Severe Mitral Regurgitation  Recurrent Paroxysmal Atrial Fibrillation  Postoperative Diagnosis:    Same  Procedure:        Minimally-Invasive Mitral Valve Repair             Complex valvuloplasty including triangular resection of posterior leaflet (P2)             Artificial Gore-tex neochords x6             Sorin Memo 3D Ring Annuloplasty (size 32mm, catalog #[id number], serial [id number])   Minimally-Invasive Maze Procedure             Complete bilateral atrial lesion set using cryothermy and bipolar radiofrequency ablation             Clipping of Left Atrial Appendage (Atricure Pro245 left atrial clip, size 45 mm)  Surgeon:        Salvatore Decent. Cornelius Moras, MD  Assistant:       Ardelle Balls, PA-C  Anesthesia:  Germaine PomfretE. Carswell Jackson, MD  Operative Findings: ? 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 Bileaflet prolapse with multiple elongated but no ruptured chordae tendineae ? Severe prolapse of P2 segment of posterior leaflet ? Type II dysfunction with severe mitral regurgitation ? Normal left ventricular systolic function      (Not in a  hospital admission) Allergies  Allergen Reactions  . Pepcid [Famotidine] Anaphylaxis, Swelling and Other (See Comments)    Causes "Throat Swelling"   . Allopurinol Other (See Comments)    Caused headaches  . Amlodipine Other (See Comments)    Results in PEDAL EDEMA   . Colchicine Rash  . Contrast Media [Iodinated Diagnostic Agents] Hives    IVP dye per patient  . Prilosec [Omeprazole] Nausea Only  . Atorvastatin Other (See Comments)    Causes her leg muscles to ache  . Cephalexin Itching and Rash  . Ciprofloxacin Itching and Rash  . Erythromycin Itching and Rash  . Sulfonamide Derivatives Itching and Rash  . Tetracycline Itching and Rash    Social History   Tobacco Use  . Smoking status: Former Games developermoker  . Smokeless tobacco: Never Used  Substance Use Topics  . Alcohol use: No    Family History  Problem Relation Age of Onset  . Heart disease Mother   . Stroke Mother   . Hypertension Mother   . Heart disease Father   . Heart attack Father   . Ovarian cancer Sister   . Heart disease Brother   . Rheum arthritis Sister   . Melanoma Brother   . Brain cancer Brother   . Heart attack Brother   . Hypertension Brother   . Hypertension Sister   . Colon cancer Neg Hx    Review of Systems  Constitutional: Positive for malaise/fatigue. Negative for chills, diaphoresis, fever and weight loss.  HENT: Positive for congestion and sore throat. Negative for ear discharge, ear pain, hearing loss, nosebleeds, sinus pain and tinnitus.   Eyes: Negative.   Respiratory: Positive for cough. Negative for hemoptysis, sputum production, shortness of breath, wheezing and stridor.   Cardiovascular: Positive for palpitations and orthopnea. Negative for chest pain, claudication, leg swelling and PND.  Gastrointestinal: Negative for abdominal pain, blood in stool, constipation, diarrhea, heartburn, melena, nausea and vomiting.  Genitourinary:       Finished recent 10 day course of abx for UTI- no  current sx  Musculoskeletal: Positive for back pain and neck pain.  Skin: Negative for itching and rash.  Neurological: Positive for dizziness and weakness. Negative for tingling, tremors, sensory change, speech change, focal weakness, seizures, loss of consciousness and headaches.       Previous h/o TIA's  Endo/Heme/Allergies: Positive for environmental allergies and polydipsia. Does not bruise/bleed easily.  Psychiatric/Behavioral: Negative for depression, hallucinations, substance abuse and suicidal ideas. The patient has insomnia. The patient is not nervous/anxious.        Takes sleeping pill    Objective:   Patient Vitals for the past 8 hrs:  BP Temp Temp src Pulse Resp SpO2 Height Weight  05/22/17 1549 140/77 (!) 97.5 F (36.4 C) Oral (!) 106 (!) 21 95 % - -  05/22/17 1547 - - - - - - 5\' 6"  (1.676 m) 141 lb (64 kg)   No intake/output data recorded. No intake/output data recorded.  Current Meds  Medication Sig  . conjugated estrogens (PREMARIN) vaginal cream Place 1 Applicatorful daily vaginally.   . fluticasone (FLONASE) 50 MCG/ACT nasal spray Place 2  sprays daily as needed into both nostrils for allergies or rhinitis.   [name] levothyroxine (SYNTHROID, LEVOTHROID) 75 MCG tablet Take 1 tablet (75 mcg total) by mouth See admin instructions. Take 75 mcg by mouth daily around 0300 (Patient taking differently: Take 75 mcg daily by mouth. )  . metoprolol tartrate (LOPRESSOR) 100 MG tablet Take 1 tablet (100 mg total) 2 (two) times daily by mouth.  . Probiotic Product (PROBIOTIC DAILY PO) Take 1 tablet by mouth daily.  . QUEtiapine (SEROQUEL) 50 MG tablet Take 1 tablet (50 mg total) by mouth at bedtime.  [name] warfarin (COUMADIN) 1 MG tablet Take 1 tablet (1 mg total) by mouth daily. (Patient taking differently: Take 1 mg every morning by mouth. )    BP 140/77 (BP Location: Right Arm)   Pulse (!) 106   Temp (!) 97.5 F (36.4 C) (Oral)   Resp (!) 21   Ht 5\' 6"  (1.676 m)   Wt 141 lb (64  kg)   SpO2 95%   BMI 22.76 kg/m      Physical Exam  Constitutional: No distress.  HENT:  Mouth/Throat: Dentition is normal. No dental caries. Pharynx is abnormal.  Post pharynx exudate- possible thrush  Eyes: Pupils are equal, round, and reactive to light.  Neck: No JVD present. No neck adenopathy. No thyromegaly present.  Decreased range of motion of neck- chronic  Cardiovascular: Regular rhythm and normal heart sounds. Exam reveals no gallop.  No murmur heard. Pulses:      Carotid pulses are 1+ on the right side, and 1+ on the left side.      Radial pulses are 2+ on the right side, and 2+ on the left side.       Dorsalis pedis pulses are 1+ on the right side, and 1+ on the left side.       Posterior tibial pulses are 1+ on the right side, and 1+ on the left side.  Pulmonary/Chest: She has no wheezes. She has no rales. She exhibits no tenderness.  Decreased BS in right base  Abdominal: Soft. Bowel sounds are normal. She exhibits no distension and no mass. There is no tenderness.  Musculoskeletal: She exhibits no edema, tenderness or deformity.  Neurological: She is alert and oriented to person, place, and time.  Skin: Skin is warm and dry. No rash noted. No cyanosis. No jaundice or pallor. Nails show no clubbing.                             CLINICAL DATA:  Shortness of breath.  EXAM: CHEST  2 VIEW  COMPARISON:  Radiographs of May 22, 2017.  FINDINGS: Stable cardiomediastinal silhouette. Status post cardiac valve repair. Atherosclerosis of thoracic aorta is noted. Left-sided pacemaker is unchanged in position. No pneumothorax is noted. Large right pleural effusion is noted with probable underlying atelectasis or infiltrate. Mild left basilar subsegmental atelectasis is noted. Bony thorax is unremarkable.  IMPRESSION: Aortic atherosclerosis. Stable large right pleural effusion with probable underlying atelectasis or infiltrate.                                 .  Data Review CBC:  Lab Results  Component Value Date   WBC 9.8 05/22/2017   RBC 4.12 05/22/2017   BMP:  Lab Results  Component Value Date   GLUCOSE 188 (H) 05/22/2017   CO2 28 05/22/2017  BUN 10 05/22/2017   BUN 12 05/17/2017   CREATININE 1.23 (H) 05/22/2017   CREATININE 1.43 (H) 03/16/2016   CALCIUM 9.1 05/22/2017   Coagulation:  Lab Results  Component Value Date   INR 2.9 05/08/2017   INR 2.51 05/04/2017   APTT 35 04/16/2017   Cardiac markers: No results found for: CKMB, TROPONINT, MYOGLOBIN ABGs: No results found for: PH   Assessment:   Principal Problem:   Pleural effusion on right Active Problems:   Essential hypertension   S/P minimally invasive mitral valve repair + maze procedure   S/P minimally invasive maze operation for atrial fibrillation   Plan:   Admit for possible chest tube placement  or pleurx catheter placement  I have seen and examined the patient and agree with the assessment and plan as outlined.  Purcell Nails, MD 05/22/2017

## 2017-05-22 NOTE — Telephone Encounter (Signed)
New message   DOD called from Dr. Corliss Blacker @ [place] needs to speak with [name] ASAP.   Patient in the office now might need to be hospitalized.

## 2017-05-23 ENCOUNTER — Inpatient Hospital Stay (HOSPITAL_COMMUNITY)
Admission: RE | Admit: 2017-05-23 | Payer: Medicare Other | Source: Ambulatory Visit | Admitting: Thoracic Surgery (Cardiothoracic Vascular Surgery)

## 2017-05-23 ENCOUNTER — Encounter (HOSPITAL_COMMUNITY): Payer: Self-pay | Admitting: Orthopedic Surgery

## 2017-05-23 ENCOUNTER — Inpatient Hospital Stay (HOSPITAL_COMMUNITY): Payer: Medicare Other | Admitting: Certified Registered"

## 2017-05-23 ENCOUNTER — Other Ambulatory Visit: Payer: Self-pay

## 2017-05-23 ENCOUNTER — Inpatient Hospital Stay (HOSPITAL_COMMUNITY): Payer: Medicare Other

## 2017-05-23 ENCOUNTER — Encounter (HOSPITAL_COMMUNITY)
Admission: EM | Disposition: A | Discharge status: Home Health | Payer: Self-pay | Source: Home / Self Care | Attending: Thoracic Surgery (Cardiothoracic Vascular Surgery)

## 2017-05-23 HISTORY — PX: CHEST TUBE INSERTION: SHX231

## 2017-05-23 LAB — BASIC METABOLIC PANEL
ANION GAP: 6 (ref 5–15)
BUN: 8 mg/dL (ref 6–20)
CHLORIDE: 106 mmol/L (ref 101–111)
CO2: 28 mmol/L (ref 22–32)
Calcium: 8.5 mg/dL — ABNORMAL LOW (ref 8.9–10.3)
Creatinine, Ser: 1.09 mg/dL — ABNORMAL HIGH (ref 0.44–1.00)
GFR calc Af Amer: 55 mL/min — ABNORMAL LOW (ref >60)
GFR, EST NON AFRICAN AMERICAN: 47 mL/min — AB (ref >60)
GLUCOSE: 98 mg/dL (ref 65–99)
POTASSIUM: 2.9 mmol/L — AB (ref 3.5–5.1)
SODIUM: 140 mmol/L (ref 135–145)

## 2017-05-23 LAB — BODY FLUID CELL COUNT WITH DIFFERENTIAL
Eos, Fluid: 6 %
LYMPHS FL: 49 %
MONOCYTE-MACROPHAGE-SEROUS FLUID: 13 % — AB (ref 50–90)
Neutrophil Count, Fluid: 32 % — ABNORMAL HIGH (ref 0–25)
Total Nucleated Cell Count, Fluid: 480 cu mm (ref 0–1000)

## 2017-05-23 LAB — PROTIME-INR
INR: 1.7
Prothrombin Time: 19.8 seconds — ABNORMAL HIGH (ref 11.4–15.2)

## 2017-05-23 LAB — CBC
HCT: 32.3 % — ABNORMAL LOW (ref 36.0–46.0)
HEMOGLOBIN: 10.3 g/dL — AB (ref 12.0–15.0)
MCH: 28.8 pg (ref 26.0–34.0)
MCHC: 31.9 g/dL (ref 30.0–36.0)
MCV: 90.2 fL (ref 78.0–100.0)
PLATELETS: 233 10*3/uL (ref 150–400)
RBC: 3.58 MIL/uL — AB (ref 3.87–5.11)
RDW: 15 % (ref 11.5–15.5)
WBC: 8.4 10*3/uL (ref 4.0–10.5)

## 2017-05-23 LAB — SURGICAL PCR SCREEN
MRSA, PCR: NEGATIVE
Staphylococcus aureus: NEGATIVE

## 2017-05-23 SURGERY — INSERTION, PLEURAL DRAINAGE CATHETER
Anesthesia: Monitor Anesthesia Care | Site: Chest | Laterality: Right

## 2017-05-23 MED ORDER — SODIUM CHLORIDE 0.9% FLUSH
3.0000 mL | Freq: Two times a day (BID) | INTRAVENOUS | Status: DC
  Administered 2017-05-23 – 2017-05-24 (×2): 3 mL via INTRAVENOUS

## 2017-05-23 MED ORDER — ACETAMINOPHEN 325 MG PO TABS
650.0000 mg | ORAL_TABLET | ORAL | Status: DC | PRN
  Administered 2017-05-23 – 2017-05-24 (×2): 650 mg via ORAL
  Filled 2017-05-23 (×2): qty 2

## 2017-05-23 MED ORDER — VANCOMYCIN HCL 1000 MG IV SOLR
INTRAVENOUS | Status: DC | PRN
  Administered 2017-05-23: 1000 mg via INTRAVENOUS

## 2017-05-23 MED ORDER — PROPOFOL 10 MG/ML IV BOLUS
INTRAVENOUS | Status: DC | PRN
  Administered 2017-05-23: 30 mg via INTRAVENOUS

## 2017-05-23 MED ORDER — FENTANYL CITRATE (PF) 250 MCG/5ML IJ SOLN
INTRAMUSCULAR | Status: AC
  Filled 2017-05-23: qty 5

## 2017-05-23 MED ORDER — PROPOFOL 10 MG/ML IV BOLUS
INTRAVENOUS | Status: AC
  Filled 2017-05-23: qty 20

## 2017-05-23 MED ORDER — ONDANSETRON HCL 4 MG/2ML IJ SOLN
INTRAMUSCULAR | Status: DC | PRN
  Administered 2017-05-23: 4 mg via INTRAVENOUS

## 2017-05-23 MED ORDER — 0.9 % SODIUM CHLORIDE (POUR BTL) OPTIME
TOPICAL | Status: DC | PRN
  Administered 2017-05-23: 1000 mL

## 2017-05-23 MED ORDER — NYSTATIN 100000 UNIT/ML MT SUSP
5.0000 mL | Freq: Four times a day (QID) | OROMUCOSAL | Status: DC
  Administered 2017-05-23 – 2017-05-24 (×3): 500000 [IU] via ORAL
  Filled 2017-05-23 (×3): qty 5

## 2017-05-23 MED ORDER — SODIUM CHLORIDE 0.9% FLUSH: 3.0000 mL | INTRAVENOUS | Status: DC | PRN

## 2017-05-23 MED ORDER — VANCOMYCIN HCL IN DEXTROSE 1-5 GM/200ML-% IV SOLN
INTRAVENOUS | Status: AC
  Filled 2017-05-23: qty 200

## 2017-05-23 MED ORDER — METHOCARBAMOL 500 MG PO TABS
500.0000 mg | ORAL_TABLET | Freq: Two times a day (BID) | ORAL | Status: DC | PRN
  Administered 2017-05-23: 500 mg via ORAL
  Filled 2017-05-23: qty 1

## 2017-05-23 MED ORDER — FENTANYL CITRATE (PF) 100 MCG/2ML IJ SOLN: 25.0000 ug | INTRAMUSCULAR | Status: DC | PRN

## 2017-05-23 MED ORDER — WARFARIN SODIUM 2 MG PO TABS
1.0000 mg | ORAL_TABLET | Freq: Every day | ORAL | Status: DC
  Administered 2017-05-23: 1 mg via ORAL
  Filled 2017-05-23: qty 0.5

## 2017-05-23 MED ORDER — LIDOCAINE HCL 1 % IJ SOLN
INTRAMUSCULAR | Status: DC | PRN
  Administered 2017-05-23: 9 mL

## 2017-05-23 MED ORDER — LACTATED RINGERS IV SOLN
INTRAVENOUS | Status: DC
  Administered 2017-05-23 (×2): via INTRAVENOUS

## 2017-05-23 MED ORDER — PROPOFOL 500 MG/50ML IV EMUL
INTRAVENOUS | Status: DC | PRN
  Administered 2017-05-23: 50 ug/kg/min via INTRAVENOUS

## 2017-05-23 MED ORDER — FENTANYL CITRATE (PF) 100 MCG/2ML IJ SOLN
INTRAMUSCULAR | Status: DC | PRN
Start: 2017-05-23 — End: 2017-05-23
  Administered 2017-05-23: 50 ug via INTRAVENOUS

## 2017-05-23 MED ORDER — SODIUM CHLORIDE 0.9 % IV SOLN: 250.0000 mL | INTRAVENOUS | Status: DC | PRN

## 2017-05-23 MED ORDER — LIDOCAINE HCL (CARDIAC) 20 MG/ML IV SOLN
INTRAVENOUS | Status: DC | PRN
  Administered 2017-05-23: 60 mg via INTRAVENOUS

## 2017-05-23 MED ORDER — WARFARIN - PHYSICIAN DOSING INPATIENT: Freq: Every day | Status: DC

## 2017-05-23 MED ORDER — ACETAMINOPHEN 650 MG RE SUPP: 650.0000 mg | RECTAL | Status: DC | PRN

## 2017-05-23 SURGICAL SUPPLY — 26 items
ADH SKN CLS APL DERMABOND .7 (GAUZE/BANDAGES/DRESSINGS) ×1
BRUSH SCRUB EZ PLAIN DRY (MISCELLANEOUS) ×4 IMPLANT
CANISTER SUCT 3000ML PPV (MISCELLANEOUS) ×2 IMPLANT
COVER SURGICAL LIGHT HANDLE (MISCELLANEOUS) ×2 IMPLANT
DERMABOND ADVANCED (GAUZE/BANDAGES/DRESSINGS) ×1
DERMABOND ADVANCED .7 DNX12 (GAUZE/BANDAGES/DRESSINGS) ×1 IMPLANT
DRAPE C-ARM 42X72 X-RAY (DRAPES) ×2 IMPLANT
DRAPE LAPAROSCOPIC ABDOMINAL (DRAPES) ×2 IMPLANT
GLOVE ORTHO TXT STRL SZ7.5 (GLOVE) ×2 IMPLANT
GOWN STRL REUS W/ TWL LRG LVL3 (GOWN DISPOSABLE) ×2 IMPLANT
GOWN STRL REUS W/TWL LRG LVL3 (GOWN DISPOSABLE) ×4
KIT BASIN OR (CUSTOM PROCEDURE TRAY) ×2 IMPLANT
KIT PLEURX DRAIN CATH 1000ML (MISCELLANEOUS) ×3 IMPLANT
KIT PLEURX DRAIN CATH 15.5FR (DRAIN) ×3 IMPLANT
KIT ROOM TURNOVER OR (KITS) ×2 IMPLANT
NS IRRIG 1000ML POUR BTL (IV SOLUTION) ×2 IMPLANT
PACK GENERAL/GYN (CUSTOM PROCEDURE TRAY) ×2 IMPLANT
PAD ARMBOARD 7.5X6 YLW CONV (MISCELLANEOUS) ×4 IMPLANT
SET DRAINAGE LINE (MISCELLANEOUS) IMPLANT
SUT ETHILON 3 0 FSL (SUTURE) ×2 IMPLANT
SUT MNCRL AB 4-0 PS2 18 (SUTURE) ×1 IMPLANT
SUT VIC AB 3-0 X1 27 (SUTURE) ×2 IMPLANT
TOWEL OR 17X24 6PK STRL BLUE (TOWEL DISPOSABLE) ×2 IMPLANT
TOWEL OR 17X26 10 PK STRL BLUE (TOWEL DISPOSABLE) ×2 IMPLANT
VALVE REPLACEMENT CAP (MISCELLANEOUS) IMPLANT
WATER STERILE IRR 1000ML POUR (IV SOLUTION) ×2 IMPLANT

## 2017-05-23 NOTE — Progress Notes (Signed)
      [address]       [name] [address]             [phone]     CARDIOTHORACIC SURGERY PROGRESS NOTE  Subjective: No complaints except for dyspnea with activity  Objective: Vital signs in last 24 hours: Temp:  [97.3 F (36.3 C)-98.2 F (36.8 C)] 98.2 F (36.8 C) (11/15 0755) Pulse Rate:  [91-107] 96 (11/15 0755) Cardiac Rhythm: Ventricular paced;Other (Comment) (11/15 0816) Resp:  [18-24] 18 (11/15 0627) BP: (120-161)/(70-114) 130/77 (11/15 0755) SpO2:  [93 %-98 %] 98 % (11/15 0755) Weight:  [138 lb 6.4 oz (62.8 kg)-141 lb (64 kg)] 138 lb 6.4 oz (62.8 kg) (11/15 7253)  Physical Exam:  Rhythm:   paced  Breath sounds: Diminished on right side  Heart sounds:  RRR w/out murmur  Incisions:  Clean and dry  Abdomen:  Soft, non-distended, non-tender  Extremities:  Warm, well-perfused    Intake/Output from previous day: 11/14 0701 - 11/15 0700 In: -  Out: 150 [Urine:150] Intake/Output this shift: Total I/O In: 0  Out: 175 [Urine:175]  Lab Results: Recent Labs    05/22/17 1548 05/23/17 0558  WBC 9.8 8.4  HGB 12.2 10.3*  HCT 37.7 32.3*  PLT 244 233   BMET:  Recent Labs    05/22/17 1639 05/23/17 0558  NA 139 140  K 3.5 2.9*  CL 103 106  CO2 28 28  GLUCOSE 106* 98  BUN 8 8  CREATININE 1.18* 1.09*  CALCIUM 9.4 8.5*    CBG (last 3)  No results for input(s): GLUCAP in the last 72 hours. PT/INR:   Recent Labs    05/23/17 0558  LABPROT 19.8*  INR 1.70    CXR:  CHEST  2 VIEW  COMPARISON:  Radiographs of May 22, 2017.  FINDINGS: Stable cardiomediastinal silhouette. Status post cardiac valve repair. Atherosclerosis of thoracic aorta is noted. Left-sided pacemaker is unchanged in position. No pneumothorax is noted. Large right pleural effusion is noted with probable underlying atelectasis or infiltrate. Mild left basilar subsegmental atelectasis is noted. Bony thorax is unremarkable.  IMPRESSION: Aortic  atherosclerosis. Stable large right pleural effusion with probable underlying atelectasis or infiltrate.   Electronically Signed   By: Lupita Raider, M.D.   On: 05/22/2017 16:33  Assessment/Plan:  For right Pleur-X catheter placement later today.  All questions answered.  Purcell Nails, MD 05/23/2017 8:44 AM

## 2017-05-23 NOTE — Progress Notes (Signed)
Pt back in room, right pleurx catheter placed.  Pt drowsy and placed on tele. Will carry out new orders.

## 2017-05-23 NOTE — Anesthesia Preprocedure Evaluation (Addendum)
Anesthesia Evaluation  Patient identified by MRN, date of birth, ID band Patient awake    Reviewed: Allergy & Precautions, NPO status , Patient's Chart, lab work & pertinent test results, reviewed documented beta blocker date and time   History of Anesthesia Complications Negative for: history of anesthetic complications  Airway Mallampati: III  TM Distance: >3 FB Neck ROM: Full    Dental  (+) Dental Advisory Given   Pulmonary former smoker,  Recurrent pleural effusion s/p MV Repair   breath sounds clear to auscultation       Cardiovascular hypertension, Pt. on medications and Pt. on home beta blockers (-) angina+ dysrhythmias Atrial Fibrillation + pacemaker + Valvular Problems/Murmurs (s/p MV Repair 10/18)  Rhythm:Regular Rate:Normal     Neuro/Psych Mild confusion CVA, No Residual Symptoms    GI/Hepatic negative GI ROS, Neg liver ROS,   Endo/Other  Hypothyroidism   Renal/GU Renal InsufficiencyRenal disease     Musculoskeletal  (+) Arthritis , Osteoarthritis,    Abdominal   Peds  Hematology  (+) Blood dyscrasia (coumadin: INR 1.70), ,   Anesthesia Other Findings   Reproductive/Obstetrics                            Anesthesia Physical Anesthesia Plan  ASA: III  Anesthesia Plan: MAC   Post-op Pain Management:    Induction:   PONV Risk Score and Plan: 2 and Treatment may vary due to age or medical condition and Ondansetron  Airway Management Planned: Natural Airway and Nasal Cannula  Additional Equipment:   Intra-op Plan:   Post-operative Plan:   Informed Consent: I have reviewed the patients History and Physical, chart, labs and discussed the procedure including the risks, benefits and alternatives for the proposed anesthesia with the patient or authorized representative who has indicated his/her understanding and acceptance.   Dental advisory given  Plan Discussed with:  CRNA and Surgeon  Anesthesia Plan Comments: (Plan routine monitors, MAC)        Anesthesia Quick Evaluation

## 2017-05-23 NOTE — Telephone Encounter (Signed)
Error

## 2017-05-23 NOTE — Op Note (Signed)
CARDIOTHORACIC SURGERY OPERATIVE NOTE  Date of Procedure:  05/23/2017  Preoperative Diagnosis: Recurrent Right Pleural Effusion  Postoperative Diagnosis: Same  Procedure:   Placement of Right Pleur-X Catheter  Surgeon:   Valentina Gu. Roxy Manns, MD  Anesthesia:   1% lidocaine local with intravenous sedation (MAC)     DETAILS OF THE OPERATIVE PROCEDURE  Following full informed consent the patient was brought to the operating room where intravenous sedation was administered and the patient continuously monitored by the anesthesia staff. The right chest was prepared and draped in a sterile manner. A timeout procedure was performed.  1% lidocaine was utilized to anesthetize the skin and subcutaneous tissues. A small stab incision was made and an angiocath placed into the right pleural space using ultrasound guidance near the posterior axillary line. A guidewire was advanced through the angiocath and positioned in the posterior pleural space using fluoroscopic guidance.  A Pleur-X catheter was tunneled from a second stab incision located more anteriorly to the posterior stab incision.  Dilating catheters and an introducing sheath were passed over the guidewire.  The guidewire and dilating catheter were removed and the Pleur-X catheter advanced through the introducing sheath into the pleural space.  The sheath was removed and the final position of the catheter verified using fluoroscopy.  The catheter was secured to the skin and connected to a closed suction vacuum bottle for drainage. A total of 1000 mL of serous fluid was evacuated.  The patient tolerated the procedure well and was transported to the PACU in stable condition.  There were no complications.    Valentina Gu. Roxy Manns, MD 05/23/2017 4:18 PM

## 2017-05-23 NOTE — Progress Notes (Signed)
Pt c/o 10/10 back pain. Pt given tylenol with no relief. PA paged.

## 2017-05-23 NOTE — Anesthesia Postprocedure Evaluation (Signed)
Anesthesia Post Note  Patient: Amanda [name]  Procedure(s) Performed: INSERTION PLEURAL DRAINAGE CATHETER (Right Chest)     Patient location during evaluation: PACU Anesthesia Type: MAC Level of consciousness: awake and alert, patient cooperative and oriented Pain management: pain level controlled Vital Signs Assessment: post-procedure vital signs reviewed and stable Respiratory status: nonlabored ventilation, spontaneous breathing, respiratory function stable and patient connected to nasal cannula oxygen Cardiovascular status: blood pressure returned to baseline and stable Postop Assessment: no apparent nausea or vomiting Anesthetic complications: no    Last Vitals:  Vitals:   05/23/17 1645 05/23/17 1700  BP: (!) 156/80 (!) 172/90  Pulse: 89 100  Resp: 18   Temp:    SpO2: 97%     Last Pain:  Vitals:   05/23/17 1615  TempSrc:   PainSc: 0-No pain                 Magdelena Kinsella,E. Charlette Hennings

## 2017-05-23 NOTE — Transfer of Care (Signed)
Immediate Anesthesia Transfer of Care Note  Patient: Amanda [name]  Procedure(s) Performed: INSERTION PLEURAL DRAINAGE CATHETER (Right Chest)  Patient Location: PACU  Anesthesia Type:MAC  Level of Consciousness: awake, alert  and oriented  Airway & Oxygen Therapy: Patient Spontanous Breathing and Patient connected to face mask oxygen  Post-op Assessment: Report given to RN and Post -op Vital signs reviewed and stable  Post vital signs: Reviewed and stable  Last Vitals:  Vitals:   05/23/17 1140 05/23/17 1615  BP: (!) 163/72 (!) (P) 152/85  Pulse: 87 (P) 96  Resp: 18 (P) 16  Temp: 36.8 C (P) 36.7 C  SpO2: 96% (P) 97%    Last Pain:  Vitals:   05/23/17 1140  TempSrc: Oral  PainSc:          Complications: No apparent anesthesia complications

## 2017-05-24 ENCOUNTER — Inpatient Hospital Stay (HOSPITAL_COMMUNITY): Payer: Medicare Other

## 2017-05-24 ENCOUNTER — Encounter: Payer: Self-pay | Admitting: Cardiology

## 2017-05-24 ENCOUNTER — Encounter (HOSPITAL_COMMUNITY): Payer: Self-pay | Admitting: Thoracic Surgery (Cardiothoracic Vascular Surgery)

## 2017-05-24 LAB — BASIC METABOLIC PANEL
Anion gap: 3 — ABNORMAL LOW (ref 5–15)
BUN: 9 mg/dL (ref 6–20)
CHLORIDE: 103 mmol/L (ref 101–111)
CO2: 31 mmol/L (ref 22–32)
CREATININE: 1.26 mg/dL — AB (ref 0.44–1.00)
Calcium: 8.5 mg/dL — ABNORMAL LOW (ref 8.9–10.3)
GFR calc non Af Amer: 40 mL/min — ABNORMAL LOW (ref >60)
GFR, EST AFRICAN AMERICAN: 46 mL/min — AB (ref >60)
Glucose, Bld: 105 mg/dL — ABNORMAL HIGH (ref 65–99)
POTASSIUM: 3.2 mmol/L — AB (ref 3.5–5.1)
SODIUM: 137 mmol/L (ref 135–145)

## 2017-05-24 LAB — PROTIME-INR
INR: 1.63
Prothrombin Time: 19.2 seconds — ABNORMAL HIGH (ref 11.4–15.2)

## 2017-05-24 LAB — PATHOLOGIST SMEAR REVIEW

## 2017-05-24 MED ORDER — ACETAMINOPHEN 325 MG PO TABS: 650.0000 mg | ORAL_TABLET | ORAL | 2 refills | Status: DC | PRN

## 2017-05-24 MED ORDER — HYDROCHLOROTHIAZIDE 25 MG PO TABS: 25.0000 mg | ORAL_TABLET | Freq: Every day | ORAL | 2 refills | Status: DC

## 2017-05-24 MED ORDER — HYDROCHLOROTHIAZIDE 25 MG PO TABS: 25.0000 mg | ORAL_TABLET | Freq: Every day | ORAL | Status: DC

## 2017-05-24 MED ORDER — NYSTATIN 100000 UNIT/ML MT SUSP: 5.0000 mL | Freq: Four times a day (QID) | OROMUCOSAL | 0 refills | Status: DC

## 2017-05-24 NOTE — Progress Notes (Signed)
CARDIAC REHAB PHASE I   PRE:  Rate/Rhythm: 87 pacing    BP: sitting 120/64    SaO2: 93 RA  MODE:  Ambulation: 470 ft   POST:  Rate/Rhythm: 92 pacing    BP: sitting 127/64     SaO2: 90-92 RA  Pt able to stand independently and walk with RW. Tried without RW initially but slightly unsteady. Did well with RW and gait belt. SaO2 maintained 90-92 RA walking. Denied SOB or problems. Return to recliner. Discussed with pt and family IS, ex gl, and CPRII. Will send referral to [place] [facility] (we never saw pt after MVR due to tx to CIR). Family appreciative. Has RW at home. [phone]   Harriet Masson CES, ACSM 05/24/2017 12:29 PM

## 2017-05-24 NOTE — Progress Notes (Signed)
CCMD informed of patient pacemaker not capturing for a few seconds. Returned to normal and patient was asymptomatic. Will continue to monitor.   Elsie Lincoln, RN

## 2017-05-24 NOTE — Care Management Note (Signed)
Case Management Note  Patient Details  Name: Amanda [name] MRN: [id number] Date of Birth: 02/21/1941  Subjective/Objective:      Rt Pleural Effusion             Action/Plan: Patient lives at home with her family; had PleurX catheter placed and is to return home with [facility] services provided by [facility] as prior to admission. Tim with [facility] called for arrangements. Daughter requested Weston Anna RN for their [facility]  Expected Discharge Date:  05/24/17               Expected Discharge Plan:  Home w Home Health Services  Discharge planning Services  CM Consult  Post Acute Care Choice:    Choice offered to:  Adult Children, Spouse, Patient  HH Arranged:  RN HH Agency:  [facility] (now [facility])  Status of Service:  In process, will continue to follow  Reola Mosher [phone] 05/24/2017, 10:05 AM

## 2017-05-24 NOTE — Progress Notes (Addendum)
[address]       [place] [id number]             [phone]      1 Day Post-Op Procedure(s) (LRB): INSERTION PLEURAL DRAINAGE CATHETER (Right) Subjective: Feels pretty good, currentlu on 1.5 liters O2  Objective: Vital signs in last 24 hours: Temp:  [97.6 F (36.4 C)-98.5 F (36.9 C)] 98.5 F (36.9 C) (11/16 0444) Pulse Rate:  [87-100] 98 (11/16 0444) Cardiac Rhythm: Ventricular paced (11/16 0700) Resp:  [16-18] 16 (11/16 0010) BP: (102-172)/(50-90) 132/66 (11/16 0444) SpO2:  [96 %-98 %] 98 % (11/16 0444) Weight:  [136 lb 1.6 oz (61.7 kg)] 136 lb 1.6 oz (61.7 kg) (11/16 0444)  Hemodynamic parameters for last 24 hours:    Intake/Output from previous day: 11/15 0701 - 11/16 0700 In: 620 [P.O.:120; I.V.:500] Out: 1585 [Urine:375; Blood:10] Intake/Output this shift: No intake/output data recorded.  General appearance: alert, cooperative and no distress Heart: regular rate and rhythm Lungs: dim right base Abdomen: benign Extremities: no edema Wound: dressing CDI  Lab Results: Recent Labs    05/22/17 1548 05/23/17 0558  WBC 9.8 8.4  HGB 12.2 10.3*  HCT 37.7 32.3*  PLT 244 233   BMET:  Recent Labs    05/23/17 0558 05/24/17 0751  NA 140 137  K 2.9* 3.2*  CL 106 103  CO2 28 31  GLUCOSE 98 105*  BUN 8 9  CREATININE 1.09* 1.26*  CALCIUM 8.5* 8.5*    PT/INR:  Recent Labs    05/24/17 0406  LABPROT 19.2*  INR 1.63   ABG    Component Value Date/Time   PHART 7.520 (H) 04/19/2017 2132   HCO3 30.8 (H) 04/19/2017 2132   TCO2 22 04/17/2017 1912   ACIDBASEDEF 1.3 04/18/2017 0505   O2SAT 94.5 04/19/2017 2132   CBG (last 3)  No results for input(s): GLUCAP in the last 72 hours.  Meds Scheduled Meds: . levothyroxine  75 mcg Oral QAC breakfast  . metoprolol tartrate  100 mg Oral BID  . nystatin  5 mL Oral QID  . QUEtiapine  50 mg Oral QHS  . sodium chloride flush  3 mL Intravenous Q12H  . sodium chloride flush  3 mL  Intravenous Q12H  . warfarin  1 mg Oral q1800  . Warfarin - Physician Dosing Inpatient   Does not apply q1800   Continuous Infusions: . sodium chloride    . sodium chloride    . lactated ringers 10 mL/hr at 05/23/17 1427   PRN Meds:.sodium chloride, sodium chloride, acetaminophen **OR** acetaminophen, methocarbamol, sodium chloride flush, sodium chloride flush  Xrays Dg Chest 2 View  Result Date: 05/22/2017 CLINICAL DATA:  Shortness of breath. EXAM: CHEST  2 VIEW COMPARISON:  Radiographs of May 22, 2017. FINDINGS: Stable cardiomediastinal silhouette. Status post cardiac valve repair. Atherosclerosis of thoracic aorta is noted. Left-sided pacemaker is unchanged in position. No pneumothorax is noted. Large right pleural effusion is noted with probable underlying atelectasis or infiltrate. Mild left basilar subsegmental atelectasis is noted. Bony thorax is unremarkable. IMPRESSION: Aortic atherosclerosis. Stable large right pleural effusion with probable underlying atelectasis or infiltrate. Electronically Signed   By: Lupita RaiderJames  Green Jr, M.D.   On: 05/22/2017 16:33   Dg Fluoro Guide Cv Line-no Report  Result Date: 05/23/2017 Fluoroscopy was utilized by the requesting physician.  No radiographic interpretation.    Assessment/Plan: S/P Procedure(s) (LRB): INSERTION PLEURAL DRAINAGE CATHETER (Right)   1 doing well 2 wean  O2 off and ambulate 3 Replace K+  4 hopefully home later today   LOS: 2 days    Rowe Clack 05/24/2017  I have seen and examined the patient and agree with the assessment and plan as outlined.  Arrange for [facility] to start draining PleurX on Monday.  Start diuretic.  Purcell Nails, MD 05/24/2017 10:01 AM

## 2017-05-24 NOTE — Progress Notes (Signed)
Pt ready for discharge to home escorted by family. All discharge instructions and rx reviewed with pt/family. Pleuravax in room for pt to take home and [facility] will follow on Monday for necessary drainage. Gauze dry dressing intact to right upper side with tubing present under gauze.  Pt and family acknowledge understanding of discharge instructions.  All personal belongings with pt.  Pt demonstrates no distress or voices no c/o.

## 2017-05-24 NOTE — Progress Notes (Signed)
Letter  

## 2017-05-24 NOTE — Discharge Summary (Signed)
Physician Discharge Summary  Patient ID: Amanda [name] MRN: [id number] DOB/AGE: 12/10/1940 76 y.o.  Admit date: 05/22/2017 Discharge date: 05/24/2017  Admission Diagnoses: Patient Active Problem List   Diagnosis Date Noted  . Pleural effusion on right 05/22/2017    Discharge Diagnoses:  Principal Problem:   Pleural effusion on right Active Problems:   Essential hypertension   S/P minimally invasive mitral valve repair + maze procedure   S/P minimally invasive maze operation for atrial fibrillation   Pleural effusion   Discharged Condition: good  HPI:  The patient is a 76 year old female well-known to [facility] having undergone previous minimally invasive mitral valve repair by Dr. Roxy Manns on 04/16/2017.  She had a complex postoperative course and subsequently spent time in inpatient rehabilitation but has been home for approximately 2 weeks.  Overall she feels as though she is doing quite well.  She did require a right thoracentesis x1 last week.  She saw her primary care physician today and on chest x-ray was found to have reaccumulation of the right-sided pleural effusion.  She was sent to [facility] for further management and is to be readmitted to Dr. Roxy Manns service for potential further procedures on the recurrent effusion.  She denies fevers, chills or other constitutional symptoms.  She did have a recent 10-day course of antibiotics for a urinary tract infection which she completed.  She is not having any urinary symptoms at this time.  She denies any significant shortness of breath but does feel a fair amount of fatigability and has 3 pillow orthopnea.    Hospital Course:  Amanda [name] underwent a Placement of a right pleurx catheter for a recurrent right pleural effusion on 05/23/2017 with Dr. Roxy Manns. She tolerated procedure well. A total of 1044m of serous fluid was evacuated. [place] P[place] 1 she was tolerating 1.5L oxygen with good saturation. We  replaced her potassium and she continued to ambulate around the unit. He pain was well controlled on Tylenol. Her home medications were continued. She was tolerating room air, her pain was well controlled, her pleurx catheter site was c/d/i and she is ready for discharge home.   Consults: None  Significant Diagnostic Studies: CLINICAL DATA:  Pleural effusion  EXAM: CHEST  2 VIEW  COMPARISON:  05/22/2017  FINDINGS: Mitral valve replacement. Left atrial clip unchanged in position. Dual lead pacemaker.  Chest tube on the right has been placed. Mild improvement in right pleural effusion. Right lower lobe airspace disease slightly increased from the prior study.  Progression of patchy left lower lobe airspace disease with a small left effusion. Apical scarring bilaterally  IMPRESSION: Interval placement of chest tube on the right with slight decrease in right pleural effusion. Right lower lobe airspace disease, possible pneumonia  Progression of left lower lobe airspace disease with small left effusion. Possible underlying mild heart failure.   Electronically Signed   By: CFranchot GalloM.D.   On: 05/24/2017 09:20   Treatments:            CARDIOTHORACIC SURGERY OPERATIVE NOTE  Date of Procedure:                05/23/2017  Preoperative Diagnosis:      Recurrent Right Pleural Effusion  Postoperative Diagnosis:    Same  Procedure:                             Placement of Right  Pleur-X Catheter  Surgeon:                                Valentina Gu. Roxy Manns, MD  Anesthesia:                            1% lidocaine local with intravenous sedation (MAC)                           DETAILS OF THE OPERATIVE PROCEDURE  Following full informed consent the patient was brought to the operating room where intravenous sedation was administered and the patient continuously monitored by the anesthesia staff. The right chest was prepared and draped in a sterile  manner. A timeout procedure was performed.  1% lidocaine was utilized to anesthetize the skin and subcutaneous tissues. A small stab incision was made and an angiocath placed into the right pleural space using ultrasound guidance near the posterior axillary line. A guidewire was advanced through the angiocath and positioned in the posterior pleural space using fluoroscopic guidance.  A Pleur-X catheter was tunneled from a second stab incision located more anteriorly to the posterior stab incision.  Dilating catheters and an introducing sheath were passed over the guidewire.  The guidewire and dilating catheter were removed and the Pleur-X catheter advanced through the introducing sheath into the pleural space.  The sheath was removed and the final position of the catheter verified using fluoroscopy.  The catheter was secured to the skin and connected to a closed suction vacuum bottle for drainage. A total of 1000 mL of serous fluid was evacuated.  The patient tolerated the procedure well and was transported to the PACU in stable condition.  There were no complications.    Valentina Gu. Roxy Manns, MD 05/23/2017 4:18 PM           Discharge Exam: Blood pressure 132/66, pulse 98, temperature 98.5 F (36.9 C), temperature source Oral, resp. rate 16, height 5' 5.98" (1.676 m), weight 136 lb 1.6 oz (61.7 kg), SpO2 98 %.    General appearance: alert, cooperative and no distress Heart: regular rate and rhythm Lungs: dim right base Abdomen: benign Extremities: no edema Wound: dressing CDI    Disposition: 06-Home-Health Care Svc  Discharge Instructions    Discharge patient   Complete by:  As directed    With home health to assist with pleurx drainage. To start on Monday 11/19   Discharge disposition:  01-Home or Self Care   Discharge patient date:  05/24/2017     Allergies as of 05/24/2017      Reactions   Pepcid [famotidine] Anaphylaxis, Swelling, Other (See Comments)   Causes "Throat  Swelling"   Allopurinol Other (See Comments)   Caused headaches   Amlodipine Other (See Comments)   Results in PEDAL EDEMA    Colchicine Rash   Contrast Media [iodinated Diagnostic Agents] Hives   IVP dye per patient   Prilosec [omeprazole] Nausea Only   Atorvastatin Other (See Comments)   Causes her leg muscles to ache   Cephalexin Itching, Rash   Ciprofloxacin Itching, Rash   Erythromycin Itching, Rash   Sulfonamide Derivatives Itching, Rash   Tetracycline Itching, Rash      Medication List    TAKE these medications   acetaminophen 325 MG tablet Commonly known as:  TYLENOL Take 2 tablets (650 mg total) every 4 (  four) hours as needed by mouth for mild pain (or Fever >/= 101).   conjugated estrogens vaginal cream Commonly known as:  PREMARIN Place 1 Applicatorful daily vaginally.   fluticasone 50 MCG/ACT nasal spray Commonly known as:  FLONASE Place 2 sprays daily as needed into both nostrils for allergies or rhinitis.   hydrochlorothiazide 25 MG tablet Commonly known as:  HYDRODIURIL Take 1 tablet (25 mg total) daily by mouth.   levothyroxine 75 MCG tablet Commonly known as:  SYNTHROID, LEVOTHROID Take 1 tablet (75 mcg total) by mouth See admin instructions. Take 75 mcg by mouth daily around 0300 What changed:    when to take this  additional instructions   metoprolol tartrate 100 MG tablet Commonly known as:  LOPRESSOR Take 1 tablet (100 mg total) 2 (two) times daily by mouth.   nystatin 100000 UNIT/ML suspension Commonly known as:  MYCOSTATIN Take 5 mLs (500,000 Units total) 4 (four) times daily by mouth.   PROBIOTIC DAILY PO Take 1 tablet by mouth daily.   QUEtiapine 50 MG tablet Commonly known as:  SEROQUEL Take 1 tablet (50 mg total) by mouth at bedtime.   warfarin 1 MG tablet Commonly known as:  COUMADIN Take as directed. If you are unsure how to take this medication, talk to your nurse or doctor. Original instructions:  Take 1 tablet (1 mg  total) by mouth daily. What changed:  when to take this      Follow-up Information    Cari Caraway, MD. Go on 05/27/2017.   Specialty:  Family Medicine Why:  @11 :[place]ane information: [place] [id number] [phone]        [name], MD Follow up.   Specialty:  Cardiothoracic Surgery Why:  Your appointment is on 06/10/2017 at 2:30pm. Please arrive at 2pm for a chest xray located at [place] which is on the first floor of our building. Contact information: [place] [address] [phone]        [facility] Follow up.   Specialty:  [place] Why:  They will send a HHRN to your home to help you as needed Contact information: [place] [address] [phone]           Signed: Elgie Collard 05/24/2017, 12:17 PM

## 2017-05-27 ENCOUNTER — Encounter: Payer: Medicare Other | Admitting: Thoracic Surgery (Cardiothoracic Vascular Surgery)

## 2017-05-27 ENCOUNTER — Telehealth (HOSPITAL_COMMUNITY): Payer: Self-pay

## 2017-05-27 ENCOUNTER — Ambulatory Visit (INDEPENDENT_AMBULATORY_CARE_PROVIDER_SITE_OTHER): Payer: Medicare Other | Admitting: Cardiology

## 2017-05-27 ENCOUNTER — Encounter (INDEPENDENT_AMBULATORY_CARE_PROVIDER_SITE_OTHER): Payer: Self-pay

## 2017-05-27 ENCOUNTER — Encounter: Payer: Self-pay | Admitting: Cardiology

## 2017-05-27 VITALS — BP 130/80 | HR 93 | Ht 66.0 in | Wt 142.1 lb

## 2017-05-27 DIAGNOSIS — Z9889 Other specified postprocedural states: Secondary | ICD-10-CM | POA: Diagnosis not present

## 2017-05-27 LAB — BODY FLUID CULTURE: Culture: NO GROWTH

## 2017-05-27 NOTE — Patient Instructions (Signed)
Medication Instructions:   Your physician recommends that you continue on your current medications as directed. Please refer to the Current Medication list given to you today.     If you need a refill on your cardiac medications before your next appointment, please call your pharmacy.  Labwork: NONE ORDERED  TODAY    Testing/Procedures: NONE ORDERED  TODAY    Follow-Up:  .WITH DR [name]  IN January  Or  February    Any Other Special Instructions Will Be Listed Below (If Applicable).

## 2017-05-27 NOTE — Progress Notes (Signed)
05/27/2017 Amanda [name]   30-Mar-1941  [id number]  Primary Physician [name], MD Primary Cardiologist: [name]  Electrophysiologist: Dr.Taylor   Reason for Visit/CC: F/u for tachycardia/ medication monitoring   HPI: Amanda [name] is a 76 y.o. female with history of PAC's, second degree AV block s/p St. Jude PPM, PAF on pacer check (now on NOAC for CHADs2VASC score of 4), CKD III, occult stroke by imaging, HTN, history of MVP with recent PVCs and progressive severe mitral regurgitation/CHF due to valvular disease s/p recent mitral valve repair with MAZE procedure by Dr. Cornelius Moras 04/16/17. She also had recent pleural effusion requiring thoracentesis and required insertion of a pleural drainage catheter several weeks ago.   She was seen by Ronie Spies, PA-C on 05/17/17 and had symptoms dyspnea w/ elevated WBC. Felt to be possible PNA and recommended that she start antibiotics and f/u with PCP for further management.   She also had sinus tach, however her antenolol could not be further increased given her CKD/ Crcl. Thus her BB was changed to metoprolol. Of note TSH was also checked and was normal.   She presents back to clinic today for f/u to reassess HR, BP and dyspnea.   She is feeling better. Tolerating metoprolol well w/o side effects. HR in the low 90s. Was previously in the 100s.  BP is well controlled. Asymptomatic.   She notes that her PCP did not feel her respiratory symptoms were due to PNA but instead recurrent pleural effusion. After her last cardiology clinic appt, she required repeat thoracentesis with insertion of a pleural drainage catheter. She has f/u today with provider to have her pleural catheter drained. She has a home Actor and PT several days a week. Progressing well with PT. Better tolerating activities. No exertional dyspnea. Also denies fever, chills, cough.   No outpatient medications have been marked as taking for the 05/27/17 encounter (Office  Visit) with Allayne Butcher, PA-C.   Allergies  Allergen Reactions  . Pepcid [Famotidine] Anaphylaxis, Swelling and Other (See Comments)    Causes "Throat Swelling"   . Allopurinol Other (See Comments)    Caused headaches  . Amlodipine Other (See Comments)    Results in PEDAL EDEMA   . Colchicine Rash  . Contrast Media [Iodinated Diagnostic Agents] Hives    IVP dye per patient  . Prilosec [Omeprazole] Nausea Only  . Atorvastatin Other (See Comments)    Causes her leg muscles to ache  . Cephalexin Itching and Rash  . Ciprofloxacin Itching and Rash  . Erythromycin Itching and Rash  . Sulfonamide Derivatives Itching and Rash  . Tetracycline Itching and Rash   Past Medical History:  Diagnosis Date  . Allergic rhinitis   . Anemia   . CKD (chronic kidney disease), stage III (HCC)    stage III  . DJD (degenerative joint disease)   . Glomerulonephritis    Dr [name]  . Hyperlipidemia   . Hyperparathyroidism (HCC)   . Hypertension   . Hypothyroidism   . IBS (irritable bowel syndrome)   . Interstitial cystitis   . Mitral regurgitation   . Mobitz II    a. s/p STJ dual chamber PPM   . MVP (mitral valve prolapse)    moderate posterior MVP with moderate MR and grade II diasotlic dysfunction  . PAC (premature atrial contraction)   . PAF (paroxysmal atrial fibrillation) (HCC)     note on pacer check. CHADS2VASC score is 4 now on Eliquis.  [name]  PVC's (premature ventricular contractions)   . S/P minimally invasive maze operation for atrial fibrillation 04/16/2017   Complete bilateral atrial lesion set using cryothermy and bipolar radiofrequency ablation with clipping of LA appendage via right mini thoracotomy approach  . S/P minimally invasive mitral valve repair 04/16/2017   Complex valvuloplasty including triangular resection of posterior leaflet, artificial Gore-tex neochords x6 and Sorin Memo 3D ring annuloplasty ([id number], size 32, serial # [id number])  . S/P placement of cardiac  pacemaker   . Small vessel disease, cerebrovascular   . Stroke [facility](HCC)    a. old stroke seen on imaging.   Family History  Problem Relation Age of Onset  . Heart disease Mother   . Stroke Mother   . Hypertension Mother   . Heart disease Father   . Heart attack Father   . Ovarian cancer Sister   . Heart disease Brother   . Rheum arthritis Sister   . Melanoma Brother   . Brain cancer Brother   . Heart attack Brother   . Hypertension Brother   . Hypertension Sister   . Colon cancer Neg Hx    Past Surgical History:  Procedure Laterality Date  . ABDOMINAL HYSTERECTOMY    . BREAST BIOPSY Right   . CANCELLED PROCEDURE  12/28/2013   Performed by Quintella Reicherturner, Traci R, [facility]  . CLIPPING OF ATRIAL APPENDAGE using AtriCure Pro2 clip 45  04/16/2017   Performed by Purcell Nailswen, Clarence H, [facility]  . CYSTOSTOMY W/ BLADDER BIOPSY    . INSERTION PLEURAL DRAINAGE CATHETER Right 05/23/2017   Performed by Purcell Nailswen, Clarence H, MD [facility]  . MINIMALLY INVASIVE MAZE PROCEDURE N/A 04/16/2017   Performed by Purcell Nailswen, [facility] H[facility]  . MINIMALLY INVASIVE MITRAL VALVE REPAIR (MVR) Right 04/16/2017   Performed by Purcell Nailswen, Clarence H, MD at [facility]  . Pacemaker Implant N/A 01/07/2015   Performed by Marinus Mawaylor, Gregg W, MD [facility]  . RE-EXPLORATION RIGHT THORACOTOMY FOR BLEEDING Right 04/16/2017   Performed by Purcell Nailswen, Clarence H, [facility]  . RIGHT/LEFT HEART CATH AND CORONARY ANGIOGRAPHY N/A 03/26/2017   Performed by Marykay LexHarding, David W, MD [facility]  . TONSILLECTOMY    . TRANSESOPHAGEAL ECHOCARDIOGRAM (TEE) N/A 04/16/2017   Performed by Purcell Nailswen, Clarence H, [facility]  . TRANSESOPHAGEAL ECHOCARDIOGRAM (TEE) N/A 03/18/2017   Performed by Chilton Siandolph, Tiffany, MD [facility]  . Upper Extremity Venography N/A 02/03/2015   Performed by Duke SalviaKlein, Steven C, MD at [facility] LAB   Social History   Socioeconomic History  . Marital status: Married    Spouse name: Not on file  . Number of  children: 2  . Years of education: Not on file  . Highest education level: Not on file  Social Needs  . Financial resource strain: Not on file  . Food insecurity - worry: Not on file  . Food insecurity - inability: Not on file  . Transportation needs - medical: Not on file  . Transportation needs - non-medical: Not on file  Occupational History  . Occupation: retired  Tobacco Use  . Smoking status: Former Games developermoker  . Smokeless tobacco: Never Used  Substance and Sexual Activity  . Alcohol use: No  . Drug use: No  . Sexual activity: Not on file  Other Topics Concern  . Not on file  Social History Narrative   Patient drinks 1-2 cups of caffeine  daily.   Patient is right handed.      Review of Systems: General: negative for chills, fever, night sweats or weight changes.  Cardiovascular: negative for chest pain, dyspnea on exertion, edema, orthopnea, palpitations, paroxysmal nocturnal dyspnea or shortness of breath Dermatological: negative for rash Respiratory: negative for cough or wheezing Urologic: negative for hematuria Abdominal: negative for nausea, vomiting, diarrhea, bright red blood per rectum, melena, or hematemesis Neurologic: negative for visual changes, syncope, or dizziness All other systems reviewed and are otherwise negative except as noted above.   Physical Exam:  Blood pressure 130/80, pulse 93, height 5\' 6"  (1.676 m), weight 142 lb 1.9 oz (64.5 kg), SpO2 98 %.  General appearance: alert, cooperative and no distress Neck: no carotid bruit and no JVD Lungs: clear to auscultation bilaterally Heart: regular rate and rhythm, S1, S2 normal, no murmur, click, rub or gallop Extremities: extremities normal, atraumatic, no cyanosis or edema Pulses: 2+ and symmetric Skin: Skin color, texture, turgor normal. No rashes or lesions Neurologic: Grossly normal  EKG not performed  -- personally reviewed   ASSESSMENT AND PLAN:   1. Sinus Tach: HR improved after change from  atenolol to metoprolol. She is tolerating well. No symptoms.   2. Recurrent Pleural Effusions: s/p thoracentesis, now with pleural cath, followed by Dr. Cornelius Moras.  3. Recent Mitral Valve Repair w/ Maze Procedure: doing well. No CP or dyspnea. Denies palpitations.   4. PAF: RRR on exam. HR well controlled w/ BB. On coumadin for a/c.   5. PPM: followed by Dr. [name]    Follow-Up: doing well post MVR and Maze procedure. HR under better control after change in BB. F/u with [name] in 2-3 months.   Brittainy Delmer Islam, MHS [facility] 05/27/2017 9:41 AM

## 2017-05-27 NOTE — Telephone Encounter (Signed)
Patients insurance is active and benefits verified through [facility] - $20.00 co-pay, no deductible, out of pocket amount of $4,400/$3,373.92 has been met, no co-insurance, and no pre-authorization is required. Passport/reference [phone]

## 2017-06-03 ENCOUNTER — Encounter: Payer: Medicare Other | Attending: Physical Medicine & Rehabilitation

## 2017-06-03 ENCOUNTER — Encounter: Payer: Self-pay | Admitting: Physical Medicine & Rehabilitation

## 2017-06-03 ENCOUNTER — Ambulatory Visit: Payer: Medicare Other | Admitting: Physical Medicine & Rehabilitation

## 2017-06-03 DIAGNOSIS — R5381 Other malaise: Secondary | ICD-10-CM | POA: Insufficient documentation

## 2017-06-03 DIAGNOSIS — M545 Low back pain, unspecified: Secondary | ICD-10-CM

## 2017-06-03 NOTE — Progress Notes (Signed)
Subjective:    Patient ID: Amanda [name], female    DOB: [birth year], 76 y.o.   MRN: [id number] 76 year old right-handed female with history of mitral valve prolapse and regurgitation, CVA, hypertension as well as CKD stage 3 and heart block with pacemaker, PAF, on long-term anticoagulation.  Lives with spouse, independent prior to admission.  Presented on April 15, 2017, for evaluation and treatment of mitral regurgitation.  Recent echocardiogram with normal left ventricular systolic function, mild to moderate mitral regurgitation. Followup echo showed significant worsening in severity of mitral regurgitation.  Underwent Maze procedure on April 16, 2017, per Dr. Cornelius Moras.  Postoperatively, chest tube was placed, later removed. Monitoring of volumes, increase of 150-250 mL of output.  Underwent re- exploration of right mini thoracotomy for any bleeding with no findings of reaccumulated blood.  Intermittent bouts of confusion, restlessness. Cranial CT scan negative for acute processes.  Initially maintained on subcutaneous Lovenox for DVT prophylaxis, transition to Coumadin therapy.  Her diet was slowly advanced to a dysphagia #2 thin liquid. She was admitted for comprehensive rehab program.  HPI Since discharge from inpatient rehab, the patient has seen her primary care physician.  She was having some shortness of breath at that time and was referred back to CV TS, patient was admitted for Pleurx cath insertion right side on 05/22/2017. She tolerated this procedure well.  She still has the catheter in.,  She is receiving home health nursing services, catheter is drained every 3 days. In addition she gets home health physical therapy.  No falls at home, she remains independent with all her self-care and mobility.  She has not driven since her hospitalizations.  Also has low back pain mostly in the lumbar area but some in the thoracic area.  Also has pain along the right lateral. Pain  Inventory Average Pain 5 Pain Right Now 5 My pain is intermittent, sharp and dull  In the last 24 hours, has pain interfered with the following? General activity 4 Relation with others 0 Enjoyment of life 4 What TIME of day is your pain at its worst? morning Sleep (in general) Good  Pain is worse with: walking, bending and some activites Pain improves with: n/a Relief from Meds: n/a  Mobility walk without assistance how many minutes can you walk? 20 ability to climb steps?  yes do you drive?  no Do you have any goals in this area?  yes  Function retired Do you have any goals in this area?  no  Neuro/Psych loss of taste or smell  Prior Studies hospital f/u  Physicians involved in your care hospital f/u   Family History  Problem Relation Age of Onset  . Heart disease Mother   . Stroke Mother   . Hypertension Mother   . Heart disease Father   . Heart attack Father   . Ovarian cancer Sister   . Heart disease Brother   . Rheum arthritis Sister   . Melanoma Brother   . Brain cancer Brother   . Heart attack Brother   . Hypertension Brother   . Hypertension Sister   . Colon cancer Neg Hx    Social History   Socioeconomic History  . Marital status: Married    Spouse name: None  . Number of children: 2  . Years of education: None  . Highest education level: None  Social Needs  . Financial resource strain: None  . Food insecurity - worry: None  . Food insecurity - inability: None  .  Transportation needs - medical: None  . Transportation needs - non-medical: None  Occupational History  . Occupation: retired  Tobacco Use  . Smoking status: Former Games developer  . Smokeless tobacco: Never Used  Substance and Sexual Activity  . Alcohol use: No  . Drug use: No  . Sexual activity: None  Other Topics Concern  . None  Social History Narrative   Patient drinks 1-2 cups of caffeine daily.   Patient is right handed.    Past Surgical History:  Procedure  Laterality Date  . ABDOMINAL HYSTERECTOMY    . BREAST BIOPSY Right   . CHEST TUBE INSERTION Right 05/23/2017   Procedure: INSERTION PLEURAL DRAINAGE CATHETER;  Surgeon: Purcell Nails, MD;  Location: [facility] OR;  Service: Thoracic;  Laterality: Right;  possible pleurex catheter  . CLIPPING OF ATRIAL APPENDAGE  04/16/2017   Procedure: CLIPPING OF ATRIAL APPENDAGE using AtriCure Pro2 clip 45;  Surgeon: Purcell Nails, MD;  Location: [facility] OR;  Service: Open Heart Surgery;;  . CYSTOSTOMY W/ BLADDER BIOPSY    . EP IMPLANTABLE DEVICE N/A 01/07/2015   STJ dual chamber pacemaker implanted by Dr [name] for 2:1 heart block  . MINIMALLY INVASIVE MAZE PROCEDURE N/A 04/16/2017   Procedure: MINIMALLY INVASIVE MAZE PROCEDURE;  Surgeon: Purcell Nails, MD;  Location[facility] OR;  Service: Open Heart Surgery;  Laterality: N/A;  . MITRAL VALVE REPAIR Right 04/16/2017   Procedure: MINIMALLY INVASIVE MITRAL VALVE REPAIR (MVR);  Surgeon: Purcell Nails, MD;  [facility];  Service: Open Heart Surgery;  Laterality: Right;  . MITRAL VALVE REPAIR Right 04/16/2017   Procedure: RE-EXPLORATION RIGHT THORACOTOMY FOR BLEEDING;  Surgeon: Purcell Nails, MD;  Location: [facility] OR;  Service: Open Heart Surgery;  Laterality: Right;  . PERIPHERAL VASCULAR CATHETERIZATION N/A 02/03/2015   Procedure: Upper Extremity Venography;  Surgeon: [name], MD;  Location: [facility] INVASIVE CV LAB;  Service: Cardiovascular;  Laterality: N/A;  . RIGHT/LEFT HEART CATH AND CORONARY ANGIOGRAPHY N/A 03/26/2017   Procedure: RIGHT/LEFT HEART CATH AND CORONARY ANGIOGRAPHY;  Surgeon: [name], MD;  [facility] INVASIVE CV LAB;  Service: Cardiovascular;  Laterality: N/A;  . TEE WITHOUT CARDIOVERSION N/A 03/18/2017   Procedure: TRANSESOPHAGEAL ECHOCARDIOGRAM (TEE);  Surgeon: [name], MD;  Location: [facility] ENDOSCOPY;  Service: Cardiovascular;  Laterality: N/A;  . TEE WITHOUT CARDIOVERSION N/A 04/16/2017   Procedure: TRANSESOPHAGEAL ECHOCARDIOGRAM  (TEE);  Surgeon: Purcell Nails, MD;  Location: [facility] OR;  Service: Open Heart Surgery;  Laterality: N/A;  . TONSILLECTOMY     Past Medical History:  Diagnosis Date  . Allergic rhinitis   . Anemia   . CKD (chronic kidney disease), stage III (HCC)    stage III  . DJD (degenerative joint disease)   . Glomerulonephritis    Dr [name]  . Hyperlipidemia   . Hyperparathyroidism (HCC)   . Hypertension   . Hypothyroidism   . IBS (irritable bowel syndrome)   . Interstitial cystitis   . Mitral regurgitation   . Mobitz II    a. s/p STJ dual chamber PPM   . MVP (mitral valve prolapse)    moderate posterior MVP with moderate MR and grade II diasotlic dysfunction  . PAC (premature atrial contraction)   . PAF (paroxysmal atrial fibrillation) (HCC)     note on pacer check. CHADS2VASC score is 4 now on Eliquis.  [name] PVC's (premature ventricular contractions)   . S/P minimally invasive maze operation for atrial fibrillation 04/16/2017   Complete bilateral atrial  lesion set using cryothermy and bipolar radiofrequency ablation with clipping of LA appendage via right mini thoracotomy approach  . S/P minimally invasive mitral valve repair 04/16/2017   Complex valvuloplasty including triangular resection of posterior leaflet, artificial Gore-tex neochords x6 and Sorin Memo 3D ring annuloplasty ([id number], size 32, serial # [id number])  . S/P placement of cardiac pacemaker   . Small vessel disease, cerebrovascular   . Stroke [facility](HCC)    a. old stroke seen on imaging.   There were no vitals taken for this visit.  Opioid Risk Score:   Fall Risk Score:  `1  Depression screen PHQ 2/9  Depression screen PHQ 2/9 06/03/2017  Decreased Interest 0  Down, Depressed, Hopeless 0  PHQ - 2 Score 0  Altered sleeping 0  Tired, decreased energy 0  Change in appetite 0  Feeling bad or failure about yourself  0  Trouble concentrating 0  Moving slowly or fidgety/restless 0  Suicidal thoughts 0  PHQ-9 Score 0    Difficult doing work/chores Not difficult at all    Review of Systems  Constitutional: Negative.   HENT: Negative.   Eyes: Negative.   Respiratory: Negative.   Cardiovascular: Negative.   Gastrointestinal: Negative.   Endocrine: Negative.   Genitourinary: Negative.   Musculoskeletal: Positive for gait problem.  Skin: Negative.   Allergic/Immunologic: Negative.   Psychiatric/Behavioral: Negative.   All other systems reviewed and are negative.      Objective:   Physical Exam  Constitutional: She is oriented to person, place, and time. She appears well-developed and well-nourished. No distress.  HENT:  Head: Normocephalic and atraumatic.  Eyes: Conjunctivae and EOM are normal. Pupils are equal, round, and reactive to light.  Pulmonary/Chest: No tachypnea. No respiratory distress. She has decreased breath sounds in the right lower field. She has no wheezes. She has no rhonchi. She has no rales.  Neurological: She is alert and oriented to person, place, and time.  Motor strength is 5/5 bilateral deltoid, bicep, tricep, grip, hip flexor, knee extensor, flexor  Skin: She is not diaphoretic.  Psychiatric: She has a normal mood and affect.  Nursing note and vitals reviewed.  Gait is without assistive device no evidence of toe drag or knee instability  Tenderness over right lateral ribs.  No bruising. Lungs are clear in the upper lung fields bilaterally left lower lung field, there is decreased breath sounds on the right side.  Negative straight leg raising       Assessment & Plan:  1.  Debility after mitral valve replacement, maze procedure.  Postoperative right pleural effusion.  Overall functioning at a modified level. To get clearance from cardio thoracic surgery.  2.  Low back pain likely degenerative exacerbated by immobility.  I think this should improve with time.  We discussed self management techniques including heating pad 30 minutes twice a day as well as a cream  such as icy hot or BenGay. If she does not have resolution of the symptoms, I would be happy to see her again to further evaluate.

## 2017-06-03 NOTE — Patient Instructions (Addendum)
Please call for outpt therapy referral if back is no better  Get driving OK fom your surgeon

## 2017-06-05 ENCOUNTER — Ambulatory Visit (INDEPENDENT_AMBULATORY_CARE_PROVIDER_SITE_OTHER): Payer: Medicare Other

## 2017-06-05 DIAGNOSIS — I480 Paroxysmal atrial fibrillation: Secondary | ICD-10-CM

## 2017-06-05 DIAGNOSIS — Z7901 Long term (current) use of anticoagulants: Secondary | ICD-10-CM | POA: Diagnosis not present

## 2017-06-05 DIAGNOSIS — Z8673 Personal history of transient ischemic attack (TIA), and cerebral infarction without residual deficits: Secondary | ICD-10-CM | POA: Diagnosis not present

## 2017-06-05 LAB — POCT INR: INR: 1.9

## 2017-06-05 NOTE — Patient Instructions (Signed)
Spoke with Academic librarian with [facility] & instructed to have pt take 2mg  today, then resume same dosage 1mg  daily.  Recheck INR in 1 week.

## 2017-06-10 ENCOUNTER — Ambulatory Visit
Admission: RE | Admit: 2017-06-10 | Discharge: 2017-06-10 | Disposition: A | Discharge status: Home / Self Care | Payer: Medicare Other | Source: Ambulatory Visit | Attending: Thoracic Surgery (Cardiothoracic Vascular Surgery) | Admitting: Thoracic Surgery (Cardiothoracic Vascular Surgery)

## 2017-06-10 ENCOUNTER — Other Ambulatory Visit: Payer: Self-pay

## 2017-06-10 ENCOUNTER — Ambulatory Visit: Payer: Self-pay | Admitting: Surgical

## 2017-06-10 VITALS — BP 122/75 | HR 86 | Ht 66.0 in | Wt 134.8 lb

## 2017-06-10 DIAGNOSIS — I341 Nonrheumatic mitral (valve) prolapse: Secondary | ICD-10-CM

## 2017-06-10 DIAGNOSIS — J90 Pleural effusion, not elsewhere classified: Secondary | ICD-10-CM

## 2017-06-10 NOTE — Patient Instructions (Signed)
Continue current treatment. 

## 2017-06-10 NOTE — Progress Notes (Signed)
[address]       [place] [address]             [phone]      Amanda [name] Amanda [name] Medical Record #[id number] Date of Birth: [birth year]  Referring: End, Harrell Gave, MD Primary Care: Cari Caraway, MD  Chief Complaint:   POST OP FOLLOW UP            [] Hide copied text  [] Hover for details   CARDIOTHORACIC SURGERY OPERATIVE NOTE  Date of Procedure:                            04/16/2017  Preoperative Diagnosis:         Severe Mitral Regurgitation  Recurrent Paroxysmal Atrial Fibrillation  Postoperative Diagnosis:    Same  Procedure:        Minimally-Invasive Mitral Valve Repair             Complex valvuloplasty including triangular resection of posterior leaflet (P2)             Artificial Gore-tex neochords x6             Sorin Memo 3D Ring Annuloplasty (size 5m, catalog #[id number] #[id number]   Minimally-Invasive Maze Procedure             Complete bilateral atrial lesion set using cryothermy and bipolar radiofrequency ablation             Clipping of Left Atrial Appendage (Atricure Pro245 left atrial clip, size 45 mm)  Surgeon:        CValentina Gu ORoxy Manns MD  Assistant:       DNani Skillern PA-C  Anesthesia:    EMidge Minium MD  Operative Findings: ? 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 Bileaflet prolapse with multiple elongated but no ruptured chordae tendineae ? Severe prolapse of P2 segment of posterior leaflet ? Type II dysfunction with severe mitral regurgitation ? Normal left ventricular systolic function             [] Hide copied text  [] Hover for details   CARDIOTHORACIC SURGERY OPERATIVE NOTE  Date of Procedure:                05/23/2017  Preoperative Diagnosis:      Recurrent Right Pleural Effusion  Postoperative Diagnosis:    Same  Procedure:                             Placement of Right Pleur-X Catheter  Surgeon:                                 CValentina Gu ORoxy Manns MD  Anesthesia:                            1% lidocaine local with intravenous sedation (MAC)                   History of Present Illness:    The patient is a 76year old female status post the above procedures seen in the office on today's date in routine follow-up.  She is currently on an every third day Pleurx drainage schedule.  This is being done by home nursing results are quite variable from 90 cc  up to as much as 950 cc.  She is overall feeling fairly well without significant shortness of breath.  She chills or other constitutional symptoms.  She is getting more active with home physical therapy and tolerating this pretty well.  She does have some minor musculoskeletal complaints which are not unexpected.      Past Medical History:  Diagnosis Date  . Allergic rhinitis   . Anemia   . CKD (chronic kidney disease), stage III ([place])    stage III  . DJD (degenerative joint disease)   . Glomerulonephritis    Dr Jimmy Footman  . Hyperlipidemia   . Hyperparathyroidism ([place])   . Hypertension   . Hypothyroidism   . IBS (irritable bowel syndrome)   . Interstitial cystitis   . Mitral regurgitation   . Mobitz II    a. s/p STJ dual chamber PPM   . MVP (mitral valve prolapse)    moderate posterior MVP with moderate MR and grade II diasotlic dysfunction  . PAC (premature atrial contraction)   . PAF (paroxysmal atrial fibrillation) ([place])     note on pacer check. CHADS2VASC score is 4 now on Eliquis.  [name] PVC's (premature ventricular contractions)   . S/P minimally invasive maze operation for atrial fibrillation 04/16/2017   Complete bilateral atrial lesion set using cryothermy and bipolar radiofrequency ablation with clipping of LA appendage via right mini thoracotomy approach  . S/P minimally invasive mitral valve repair 04/16/2017   Complex valvuloplasty including triangular resection of posterior leaflet, artificial Gore-tex neochords  x6 and Sorin Memo 3D ring annuloplasty ([id number], size 32, serial # [id number])  . S/P placement of cardiac pacemaker   . Small vessel disease, cerebrovascular   . Stroke [facility])    a. old stroke seen on imaging.     Social History   Tobacco Use  Smoking Status Former Smoker  Smokeless Tobacco Never Used    Social History   Substance and Sexual Activity  Alcohol Use No     Allergies  Allergen Reactions  . Pepcid [Famotidine] Anaphylaxis, Swelling and Other (See Comments)    Causes "Throat Swelling"   . Allopurinol Other (See Comments)    Caused headaches  . Amlodipine Other (See Comments)    Results in PEDAL EDEMA   . Colchicine Rash  . Contrast Media [Iodinated Diagnostic Agents] Hives    IVP dye per patient  . Prilosec [Omeprazole] Nausea Only  . Atorvastatin Other (See Comments)    Causes her leg muscles to ache  . Cephalexin Itching and Rash  . Ciprofloxacin Itching and Rash  . Erythromycin Itching and Rash  . Sulfonamide Derivatives Itching and Rash  . Tetracycline Itching and Rash    Current Outpatient Medications  Medication Sig Dispense Refill  . acetaminophen (TYLENOL) 325 MG tablet Take 2 tablets (650 mg total) every 4 (four) hours as needed by mouth for mild pain (or Fever >/= 101). 50 tablet 2  . conjugated estrogens (PREMARIN) vaginal cream Place 1 Applicatorful daily vaginally.     . fluticasone (FLONASE) 50 MCG/ACT nasal spray Place 2 sprays daily as needed into both nostrils for allergies or rhinitis.     . hydrochlorothiazide (HYDRODIURIL) 25 MG tablet Take 1 tablet (25 mg total) daily by mouth. 30 tablet 2  . levothyroxine (SYNTHROID, LEVOTHROID) 75 MCG tablet Take 1 tablet (75 mcg total) by mouth See admin instructions. Take 75 mcg by mouth daily around 0300 (Patient taking differently: Take 75 mcg daily by mouth. ) 30 tablet 0  .  metoprolol tartrate (LOPRESSOR) 100 MG tablet Take 1 tablet (100 mg total) 2 (two) times daily by mouth. 180 tablet 3  .  Probiotic Product (PROBIOTIC DAILY PO) Take 1 tablet by mouth daily.    . QUEtiapine (SEROQUEL) 50 MG tablet Take 1 tablet (50 mg total) by mouth at bedtime. 30 tablet 0  . warfarin (COUMADIN) 1 MG tablet Take 1 tablet (1 mg total) by mouth daily. (Patient taking differently: Take 1 mg every morning by mouth. ) 30 tablet 11   No current facility-administered medications for this visit.        Physical Exam: BP 122/75   Pulse 86   Ht 5' 6"  (1.676 m)   Wt 134 lb 12.8 oz (61.1 kg)   SpO2 97%   BMI 21.76 kg/m   General appearance: alert, cooperative and no distress Heart: regular rate and rhythm and no murmur Lungs: clear to auscultation bilaterally Extremities: no edema or JVD Wound: The surgical incisions are healing well without evidence of infection.  The Pleurx site does have a small amount of erythema which appears to be inflammatory in nature.  There is no drainage or fluctuance associated with it.   Diagnostic Studies & Laboratory data:     Recent Radiology Findings:   Dg Chest 2 View  Result Date: 06/10/2017 CLINICAL DATA:  76 year old female with history of mitral valve repair EXAM: CHEST  2 VIEW COMPARISON:  05/24/2017 FINDINGS: Cardiomediastinal silhouette within normal limits, with and proving visualization of the heart borders. Surgical changes of left atrial amputation. Surgical changes of mitral annuloplasty. No central vascular congestion.  No interlobular septal thickening. Unchanged position of right-sided thoracostomy tube. Blunting of the right costophrenic angle and cardiophrenic angle with partial obscuration the right hemidiaphragm. Overall, right basilar opacities are improved. Improved left lung base aeration. Pleuroparenchymal thickening at the apices of the lung is unchanged. Calcifications of the aortic valve. Unchanged position of cardiac pacing device on the left chest wall IMPRESSION: Improving aeration of the lungs, with decreased size of right-sided  pleural effusion and resolution of the left pleural effusion. Unchanged right-sided thoracostomy tube/tunneled catheter. Surgical changes of prior mitral valve angioplasty and left atrial amputation. Chronic lung changes. Electronically Signed   By: Corrie Mckusick D.O.   On: 06/10/2017 14:25      Recent Lab Findings: Lab Results  Component Value Date   WBC 8.4 05/23/2017   HGB 10.3 (L) 05/23/2017   HCT 32.3 (L) 05/23/2017   PLT 233 05/23/2017   GLUCOSE 105 (H) 05/24/2017   CHOL 153 01/07/2015   TRIG 197 (H) 04/22/2017   HDL 51 01/07/2015   LDLCALC 89 01/07/2015   ALT 16 05/22/2017   AST 29 05/22/2017   NA 137 05/24/2017   K 3.2 (L) 05/24/2017   CL 103 05/24/2017   CREATININE 1.26 (H) 05/24/2017   BUN 9 05/24/2017   CO2 31 05/24/2017   TSH 2.010 05/17/2017   INR 1.9 06/05/2017   HGBA1C 5.8 (H) 04/12/2017      Assessment / Plan: The patient is doing well.  Her chest x-ray appearance is improved.  She does have consistently a fair amount of drainage with some significant variability as described above.  It has been 4 days since last drainage and I obtained 600 cc of fluid today.  We will continue on the same schedule currently of every third day drainage and see her in 2 weeks. The patient also questioned whether she can begin to wean off Seroquel which she  is taking at night as a sleep aid.  I discussed that that would be okay with a gradual wean over time with monitoring of insomnia symptoms.       John Giovanni, PA-C 06/10/2017 2:57 PM

## 2017-06-12 ENCOUNTER — Ambulatory Visit (INDEPENDENT_AMBULATORY_CARE_PROVIDER_SITE_OTHER): Payer: Medicare Other | Admitting: Cardiovascular Disease

## 2017-06-12 DIAGNOSIS — Z8673 Personal history of transient ischemic attack (TIA), and cerebral infarction without residual deficits: Secondary | ICD-10-CM

## 2017-06-12 DIAGNOSIS — Z7901 Long term (current) use of anticoagulants: Secondary | ICD-10-CM

## 2017-06-12 DIAGNOSIS — I480 Paroxysmal atrial fibrillation: Secondary | ICD-10-CM

## 2017-06-12 LAB — POCT INR: INR: 2.2

## 2017-06-19 ENCOUNTER — Ambulatory Visit (INDEPENDENT_AMBULATORY_CARE_PROVIDER_SITE_OTHER): Payer: Medicare Other

## 2017-06-19 DIAGNOSIS — Z7901 Long term (current) use of anticoagulants: Secondary | ICD-10-CM | POA: Diagnosis not present

## 2017-06-19 DIAGNOSIS — I480 Paroxysmal atrial fibrillation: Secondary | ICD-10-CM

## 2017-06-19 DIAGNOSIS — Z8673 Personal history of transient ischemic attack (TIA), and cerebral infarction without residual deficits: Secondary | ICD-10-CM | POA: Diagnosis not present

## 2017-06-19 LAB — POCT INR: INR: 1.4

## 2017-06-19 NOTE — Patient Instructions (Signed)
Spoke with Eunice Blase RN with [facility] & instructed to have pt take 2mg  today and tomorrow, then start taking 1mg  daily except 2 mg on Wednesdays.  Recheck INR in 1 week.

## 2017-06-21 ENCOUNTER — Other Ambulatory Visit: Payer: Self-pay | Admitting: Thoracic Surgery (Cardiothoracic Vascular Surgery)

## 2017-06-21 DIAGNOSIS — J90 Pleural effusion, not elsewhere classified: Secondary | ICD-10-CM

## 2017-06-24 ENCOUNTER — Ambulatory Visit
Admission: RE | Admit: 2017-06-24 | Discharge: 2017-06-24 | Disposition: A | Discharge status: Home / Self Care | Payer: Medicare Other | Source: Ambulatory Visit | Attending: Thoracic Surgery (Cardiothoracic Vascular Surgery) | Admitting: Thoracic Surgery (Cardiothoracic Vascular Surgery)

## 2017-06-24 ENCOUNTER — Ambulatory Visit (INDEPENDENT_AMBULATORY_CARE_PROVIDER_SITE_OTHER): Payer: Self-pay | Admitting: Thoracic Surgery (Cardiothoracic Vascular Surgery)

## 2017-06-24 ENCOUNTER — Other Ambulatory Visit: Payer: Self-pay

## 2017-06-24 ENCOUNTER — Encounter: Payer: Self-pay | Admitting: Thoracic Surgery (Cardiothoracic Vascular Surgery)

## 2017-06-24 VITALS — BP 142/88 | HR 85 | Ht 66.0 in | Wt 133.0 lb

## 2017-06-24 DIAGNOSIS — Z9889 Other specified postprocedural states: Secondary | ICD-10-CM

## 2017-06-24 DIAGNOSIS — J90 Pleural effusion, not elsewhere classified: Secondary | ICD-10-CM

## 2017-06-24 DIAGNOSIS — Z8679 Personal history of other diseases of the circulatory system: Secondary | ICD-10-CM

## 2017-06-24 NOTE — Progress Notes (Signed)
[address]       [name] [address]             [phone]     CARDIOTHORACIC SURGERY OFFICE NOTE  Referring Provider is Quintella Reichert, MD PCP is [name], MD   HPI:  Patient is a 76 year old female with history of long-standing mitral valve prolapse and mitral regurgitation, hypertension, previous stroke, small vessel cerebrovascular disease with mild cognitive impairment, stage III chronic kidney disease secondary to glomerulonephritis and hypertension, heart block status post permanent pacemaker placement in the remote past, recurrent paroxysmal atrial fibrillation, hyperparathyroidism, hypothyroidism, interstitial cystitis with recurrent urinary tract infections, and hyperlipidemia who returns the office today for routine follow-up status post minimally invasive mitral valve repair and Maze procedure on April 16, 2017.  Her postoperative convalescence has been somewhat slow, and she underwent right Pleurx catheter placement on May 23, 2017 for recurrent pleural effusion.  She was last seen in follow-up in our office on June 10, 2017 and she returns for routine follow-up today.  She is accompanied by her husband and daughter for her visit today.  The patient is quite cheerful and reports that she feels very well.  She no longer has any significant pain.  She is looking forward to when her Pleurx catheter can be removed.  Her catheter is being drained 3 times a week and continues to drain anywhere from 90-300 mL each time it is drained.  She has no shortness of breath.  She is walking a fair amount.  Her appetite remains marginal but she is reportedly eating better.  She remains anticoagulated using warfarin.  Her strength continues to improve.  Home health physical therapy continues to work with her rehab.   Current Outpatient Medications  Medication Sig Dispense Refill  . acetaminophen (TYLENOL) 325 MG tablet Take 2 tablets (650 mg total) every 4  (four) hours as needed by mouth for mild pain (or Fever >/= 101). 50 tablet 2  . conjugated estrogens (PREMARIN) vaginal cream Place 1 Applicatorful daily vaginally.     . fluticasone (FLONASE) 50 MCG/ACT nasal spray Place 2 sprays daily as needed into both nostrils for allergies or rhinitis.     . hydrochlorothiazide (HYDRODIURIL) 25 MG tablet Take 1 tablet (25 mg total) daily by mouth. 30 tablet 2  . levothyroxine (SYNTHROID, LEVOTHROID) 75 MCG tablet Take 1 tablet (75 mcg total) by mouth See admin instructions. Take 75 mcg by mouth daily around 0300 (Patient taking differently: Take 75 mcg daily by mouth. ) 30 tablet 0  . metoprolol tartrate (LOPRESSOR) 100 MG tablet Take 1 tablet (100 mg total) 2 (two) times daily by mouth. 180 tablet 3  . Probiotic Product (PROBIOTIC DAILY PO) Take 1 tablet by mouth daily.    . QUEtiapine (SEROQUEL) 50 MG tablet Take 1 tablet (50 mg total) by mouth at bedtime. 30 tablet 0  . warfarin (COUMADIN) 1 MG tablet Take 1 tablet (1 mg total) by mouth daily. (Patient taking differently: Take 1 mg every morning by mouth. ) 30 tablet 11   No current facility-administered medications for this visit.       Physical Exam:   BP (!) 142/88   Pulse 85   Ht 5\' 6"  (1.676 m)   Wt 133 lb (60.3 kg)   SpO2 92%   BMI 21.47 kg/m   General:  Elderly but well-appearing  Chest:   Clear to auscultation with symmetrical breath sounds  CV:  Regular rate and rhythm without murmur  Incisions:  Well-healed, Pleurx catheter intact  Abdomen:  Soft nontender  Extremities:  Warm and well-perfused, no edema  Diagnostic Tests:  CHEST  2 VIEW  COMPARISON:  06/10/2017  FINDINGS: Small right pleural effusion. Right lower lung atelectasis versus scarring. No left pleural effusion. No pneumothorax. No focal consolidation. Stable cardiomediastinal silhouette. Prior mitral valve repair. Dual lead cardiac pacemaker is present.  IMPRESSION: Persistent small right pleural effusion  unchanged compared with the prior exam.   Electronically Signed   By: Elige KoHetal  Patel   On: 06/24/2017 15:14    Impression:  Patient is doing fairly well and continues to make slow but steady progress now more than 2 months status post minimally invasive mitral valve repair and Maze procedure.  Her Pleurx catheter continues to drain serous fluid but the output is slowly trending down.   Plan:  We have not recommended any changes to the patient's current medications.  I have instructed the family to decrease the frequency of Pleurx catheter drainage to twice weekly once the output is consistently less than 200 mL.  Similarly, after the frequency is decreased to twice weekly they will decrease to once a week if the catheter output is consistently less than 200 mL.  They will call should the catheter stopped draining any significant amount for more than 2 weeks.  They will return to our office for routine follow-up and rhythm check in 6 weeks.  As long as her strength continues to improve we will discuss referring her to the outpatient cardiac rehab program once the home health physical therapist feels she is ready.  All of their questions have been addressed.    Salvatore Decentlarence H. Cornelius Moraswen, MD 06/24/2017 3:09 PM

## 2017-06-24 NOTE — Patient Instructions (Addendum)
Continue all previous medications without any changes at this time  Once Pleur-X drainage < 200 mL then decrease frequency to 2x week then 1x week after than

## 2017-06-26 ENCOUNTER — Ambulatory Visit (INDEPENDENT_AMBULATORY_CARE_PROVIDER_SITE_OTHER): Payer: Medicare Other | Admitting: Internal Medicine

## 2017-06-26 DIAGNOSIS — Z8679 Personal history of other diseases of the circulatory system: Secondary | ICD-10-CM

## 2017-06-26 DIAGNOSIS — Z8673 Personal history of transient ischemic attack (TIA), and cerebral infarction without residual deficits: Secondary | ICD-10-CM | POA: Diagnosis not present

## 2017-06-26 DIAGNOSIS — I480 Paroxysmal atrial fibrillation: Secondary | ICD-10-CM | POA: Diagnosis not present

## 2017-06-26 DIAGNOSIS — Z9889 Other specified postprocedural states: Secondary | ICD-10-CM | POA: Diagnosis not present

## 2017-06-26 DIAGNOSIS — Z5181 Encounter for therapeutic drug level monitoring: Secondary | ICD-10-CM | POA: Diagnosis not present

## 2017-06-26 DIAGNOSIS — Z7901 Long term (current) use of anticoagulants: Secondary | ICD-10-CM | POA: Diagnosis not present

## 2017-06-26 LAB — POCT INR: INR: 1.7

## 2017-06-26 NOTE — Patient Instructions (Signed)
Description   Spoke with Eunice Blase RN with [facility] while in the home with the pt  & instructed to have pt take 2.5mg  today  Dec 19th  then start taking 1mg  daily except 2 mg on Sundays and  Wednesdays.  Recheck INR in 1 week.

## 2017-07-03 ENCOUNTER — Ambulatory Visit (INDEPENDENT_AMBULATORY_CARE_PROVIDER_SITE_OTHER): Payer: Medicare Other | Admitting: Internal Medicine

## 2017-07-03 DIAGNOSIS — Z7901 Long term (current) use of anticoagulants: Secondary | ICD-10-CM

## 2017-07-03 DIAGNOSIS — Z8673 Personal history of transient ischemic attack (TIA), and cerebral infarction without residual deficits: Secondary | ICD-10-CM | POA: Diagnosis not present

## 2017-07-03 DIAGNOSIS — I480 Paroxysmal atrial fibrillation: Secondary | ICD-10-CM

## 2017-07-03 LAB — POCT INR: INR: 2

## 2017-07-08 ENCOUNTER — Ambulatory Visit (INDEPENDENT_AMBULATORY_CARE_PROVIDER_SITE_OTHER): Payer: Medicare Other | Admitting: Internal Medicine

## 2017-07-08 DIAGNOSIS — I480 Paroxysmal atrial fibrillation: Secondary | ICD-10-CM

## 2017-07-08 DIAGNOSIS — Z8673 Personal history of transient ischemic attack (TIA), and cerebral infarction without residual deficits: Secondary | ICD-10-CM

## 2017-07-08 DIAGNOSIS — Z7901 Long term (current) use of anticoagulants: Secondary | ICD-10-CM | POA: Diagnosis not present

## 2017-07-08 LAB — POCT INR: INR: 2.5

## 2017-07-17 ENCOUNTER — Ambulatory Visit (INDEPENDENT_AMBULATORY_CARE_PROVIDER_SITE_OTHER): Payer: Medicare Other | Admitting: Internal Medicine

## 2017-07-17 DIAGNOSIS — Z8673 Personal history of transient ischemic attack (TIA), and cerebral infarction without residual deficits: Secondary | ICD-10-CM | POA: Diagnosis not present

## 2017-07-17 DIAGNOSIS — Z7901 Long term (current) use of anticoagulants: Secondary | ICD-10-CM

## 2017-07-17 DIAGNOSIS — I480 Paroxysmal atrial fibrillation: Secondary | ICD-10-CM | POA: Diagnosis not present

## 2017-07-17 LAB — POCT INR: INR: 2.7

## 2017-07-31 ENCOUNTER — Ambulatory Visit (INDEPENDENT_AMBULATORY_CARE_PROVIDER_SITE_OTHER): Payer: Medicare Other | Admitting: Cardiology

## 2017-07-31 DIAGNOSIS — Z5181 Encounter for therapeutic drug level monitoring: Secondary | ICD-10-CM

## 2017-07-31 DIAGNOSIS — Z7901 Long term (current) use of anticoagulants: Secondary | ICD-10-CM

## 2017-07-31 DIAGNOSIS — Z9889 Other specified postprocedural states: Secondary | ICD-10-CM | POA: Diagnosis not present

## 2017-07-31 DIAGNOSIS — I480 Paroxysmal atrial fibrillation: Secondary | ICD-10-CM | POA: Diagnosis not present

## 2017-07-31 DIAGNOSIS — Z8673 Personal history of transient ischemic attack (TIA), and cerebral infarction without residual deficits: Secondary | ICD-10-CM | POA: Diagnosis not present

## 2017-07-31 DIAGNOSIS — Z8679 Personal history of other diseases of the circulatory system: Secondary | ICD-10-CM | POA: Diagnosis not present

## 2017-07-31 LAB — POCT INR: INR: 2.2

## 2017-07-31 NOTE — Patient Instructions (Signed)
Description   Spoke with Eunice Blase RN with [facility] while in the home with the pt  & instructed to have pt Continue 1mg  daily except 2 mg on Sundays and  Wednesdays.  Recheck INR in 3 weeks.

## 2017-08-02 ENCOUNTER — Telehealth: Payer: Self-pay

## 2017-08-02 NOTE — Telephone Encounter (Signed)
Patient notified that she should decrease her PleurX drainage to two times a week until she is consistently getting 200 ml's or less.  Patient acknowledges instructions and verbalized understanding.

## 2017-08-04 NOTE — Progress Notes (Signed)
Cardiology Office Note:    Date:  08/05/2017   ID:  Amanda [name], DOB 09-30-1940, MRN [id number]  PCP:  [name], MD  Cardiologist:  No primary care provider on file.    Referring MD: [name], MD   Chief Complaint  Patient presents with  . Follow-up    PACs, second degree AV block, PAD, HTN, MR    History of Present Illness:    Amanda [name] is a 77 y.o. female with a hx of PAC's, second degree AV block s/p St. Jude PPM, PAF on pacer check (now on NOAC for CHADs2VASC score of 4), CKD III,occult stroke by imaging,HTN, history of MVP with recent PVCs andprogressive severe mitral regurgitation/CHF due to valvular disease s/p recent mitral valve repair with MAZE procedure 04/16/17.  Her MV repair was complicated by recurrent pleural effusion and had to undergo Pleurx catheter placement in November and still has it in but is hopeful that it will come out soon. She saw Dr. Cornelius Moras last mont who is following this.   She is here today for followup and is doing well.  She denies any chest pain or pressure, SOB, DOE, PND, orthopnea, LE edema,  palpitations or syncope. She occasionally has some inner ear balance problems.  She is compliant with her meds and is tolerating meds with no SE.  She denies any excessive bleeding with the warfarin.  Past Medical History:  Diagnosis Date  . Allergic rhinitis   . Anemia   . CKD (chronic kidney disease), stage III (HCC)    stage III  . DJD (degenerative joint disease)   . Glomerulonephritis    Dr [name]  . Hyperlipidemia   . Hyperparathyroidism (HCC)   . Hypertension   . Hypothyroidism   . IBS (irritable bowel syndrome)   . Interstitial cystitis   . Mitral regurgitation   . Mobitz II    a. s/p STJ dual chamber PPM   . MVP (mitral valve prolapse)    moderate posterior MVP with moderate MR and grade II diasotlic dysfunction  . PAC (premature atrial contraction)   . PAF (paroxysmal atrial fibrillation) (HCC)     note on  pacer check. CHADS2VASC score is 4 now on Eliquis.  [name] PVC's (premature ventricular contractions)   . S/P minimally invasive maze operation for atrial fibrillation 04/16/2017   Complete bilateral atrial lesion set using cryothermy and bipolar radiofrequency ablation with clipping of LA appendage via right mini thoracotomy approach  . S/P minimally invasive mitral valve repair 04/16/2017   Complex valvuloplasty including triangular resection of posterior leaflet, artificial Gore-tex neochords x6 and Sorin Memo 3D ring annuloplasty ([id number], size 32, serial # [id number])  . S/P placement of cardiac pacemaker   . Small vessel disease, cerebrovascular   . Stroke [facility])    a. old stroke seen on imaging.    Past Surgical History:  Procedure Laterality Date  . ABDOMINAL HYSTERECTOMY    . BREAST BIOPSY Right   . CHEST TUBE INSERTION Right 05/23/2017   Procedure: INSERTION PLEURAL DRAINAGE CATHETER;  Surgeon: Purcell Nails, MD;  Location: [facility] OR;  Service: Thoracic;  Laterality: Right;  possible pleurex catheter  . CLIPPING OF ATRIAL APPENDAGE  04/16/2017   Procedure: CLIPPING OF ATRIAL APPENDAGE using AtriCure Pro2 clip 45;  Surgeon: Purcell Nails, MD;  Location: [facility] OR;  Service: Open Heart Surgery;;  . CYSTOSTOMY W/ BLADDER BIOPSY    . EP IMPLANTABLE DEVICE N/A 01/07/2015   STJ dual chamber pacemaker implanted  by Dr [name] for 2:1 heart block  . MINIMALLY INVASIVE MAZE PROCEDURE N/A 04/16/2017   Procedure: MINIMALLY INVASIVE MAZE PROCEDURE;  Surgeon: Purcell Nails, MD;  Location: [facility] OR;  Service: Open Heart Surgery;  Laterality: N/A;  . MITRAL VALVE REPAIR Right 04/16/2017   Procedure: MINIMALLY INVASIVE MITRAL VALVE REPAIR (MVR);  Surgeon: Purcell Nails, MD;  Location: [facility] OR;  Service: Open Heart Surgery;  Laterality: Right;  . MITRAL VALVE REPAIR Right 04/16/2017   Procedure: RE-EXPLORATION RIGHT THORACOTOMY FOR BLEEDING;  Surgeon: Purcell Nails, MD;  Location: [facility] OR;  Service: Open Heart  Surgery;  Laterality: Right;  . PERIPHERAL VASCULAR CATHETERIZATION N/A 02/03/2015   Procedure: Upper Extremity Venography;  Surgeon: [name], MD;  Location: [facility] INVASIVE CV LAB;  Service: Cardiovascular;  Laterality: N/A;  . RIGHT/LEFT HEART CATH AND CORONARY ANGIOGRAPHY N/A 03/26/2017   Procedure: RIGHT/LEFT HEART CATH AND CORONARY ANGIOGRAPHY;  Surgeon: [name], MD;  Location: [facility] INVASIVE CV LAB;  Service: Cardiovascular;  Laterality: N/A;  . TEE WITHOUT CARDIOVERSION N/A 03/18/2017   Procedure: TRANSESOPHAGEAL ECHOCARDIOGRAM (TEE);  Surgeon: [name], MD;  Location: [facility] ENDOSCOPY;  Service: Cardiovascular;  Laterality: N/A;  . TEE WITHOUT CARDIOVERSION N/A 04/16/2017   Procedure: TRANSESOPHAGEAL ECHOCARDIOGRAM (TEE);  Surgeon: Purcell Nails, MD;  Location: [facility] OR;  Service: Open Heart Surgery;  Laterality: N/A;  . TONSILLECTOMY      Current Medications: Current Meds  Medication Sig  . acetaminophen (TYLENOL) 325 MG tablet Take 2 tablets (650 mg total) every 4 (four) hours as needed by mouth for mild pain (or Fever >/= 101).  . conjugated estrogens (PREMARIN) vaginal cream Place 1 Applicatorful daily vaginally.   . fluticasone (FLONASE) 50 MCG/ACT nasal spray Place 2 sprays daily as needed into both nostrils for allergies or rhinitis.   . hydrochlorothiazide (HYDRODIURIL) 25 MG tablet Take 1 tablet (25 mg total) daily by mouth.  . levothyroxine (SYNTHROID, LEVOTHROID) 75 MCG tablet Take 1 tablet (75 mcg total) by mouth See admin instructions. Take 75 mcg by mouth daily around 0300 (Patient taking differently: Take 75 mcg daily by mouth. )  . metoprolol tartrate (LOPRESSOR) 100 MG tablet Take 1 tablet (100 mg total) 2 (two) times daily by mouth.  . Probiotic Product (PROBIOTIC DAILY PO) Take 1 tablet by mouth daily.  [name] warfarin (COUMADIN) 1 MG tablet Take 1 tablet (1 mg total) by mouth daily. (Patient taking differently: Take 1 mg every morning by mouth. )      Allergies:   Pepcid [famotidine]; Allopurinol; Amlodipine; Colchicine; Contrast media [iodinated diagnostic agents]; Prilosec [omeprazole]; Atorvastatin; Cephalexin; Ciprofloxacin; Erythromycin; Sulfonamide derivatives; and Tetracycline   Social History   Socioeconomic History  . Marital status: Married    Spouse name: None  . Number of children: 2  . Years of education: None  . Highest education level: None  Social Needs  . Financial resource strain: None  . Food insecurity - worry: None  . Food insecurity - inability: None  . Transportation needs - medical: None  . Transportation needs - non-medical: None  Occupational History  . Occupation: retired  Tobacco Use  . Smoking status: Former Games developer  . Smokeless tobacco: Never Used  Substance and Sexual Activity  . Alcohol use: No  . Drug use: No  . Sexual activity: None  Other Topics Concern  . None  Social History Narrative   Patient drinks 1-2 cups of caffeine daily.   Patient is right handed.  Family History: The patient's family history includes Brain cancer in her brother; Heart attack in her brother and father; Heart disease in her brother, father, and mother; Hypertension in her brother, mother, and sister; Melanoma in her brother; Ovarian cancer in her sister; Rheum arthritis in her sister; Stroke in her mother. There is no history of Colon cancer.  ROS:   Please see the history of present illness.    Review of Systems  Musculoskeletal: Positive for back pain.    All other systems reviewed and negative.   EKGs/Labs/Other Studies Reviewed:    The following studies were reviewed today: none  EKG:  EKG is ordered today and showed NSR with V pacing Recent Labs: 03/05/2017: NT-Pro BNP 2,335 04/22/2017: Magnesium 1.9 05/17/2017: TSH 2.010 05/22/2017: ALT 16 05/23/2017: Hemoglobin 10.3; Platelets 233 05/24/2017: BUN 9; Creatinine, Ser 1.26; Potassium 3.2; Sodium 137   Recent Lipid Panel    Component  Value Date/Time   CHOL 153 01/07/2015 0304   TRIG 197 (H) 04/22/2017 0419   HDL 51 01/07/2015 0304   CHOLHDL 3.0 01/07/2015 0304   VLDL 13 01/07/2015 0304   LDLCALC 89 01/07/2015 0304    Physical Exam:    VS:  BP (!) 142/88   Pulse 95   Ht 5\' 6"  (1.676 m)   Wt 134 lb (60.8 kg)   BMI 21.63 kg/m     Wt Readings from Last 3 Encounters:  08/05/17 134 lb (60.8 kg)  06/24/17 133 lb (60.3 kg)  06/10/17 134 lb 12.8 oz (61.1 kg)     GEN:  Well nourished, well developed in no acute distress HEENT: Normal NECK: No JVD; No carotid bruits LYMPHATICS: No lymphadenopathy CARDIAC: RRR, no murmurs, rubs, gallops RESPIRATORY:  Clear to auscultation without rales, wheezing or rhonchi  ABDOMEN: Soft, non-tender, non-distended MUSCULOSKELETAL:  No edema; No deformity  SKIN: Warm and dry NEUROLOGIC:  Alert and oriented x 3 PSYCHIATRIC:  Normal affect   ASSESSMENT:    1. Paroxysmal atrial fibrillation (HCC)   2. Non-rheumatic mitral regurgitation   3. Essential hypertension   4. AV block    PLAN:    In order of problems listed above:  1.  Paroxysmal atrial fibrillation s/p MAZE procedure at time of MV repair - She is maintaining NSR.  She will continue on warfarin followed in our coumadin clinic.  Continue Lopressor 100mg  BID for rate control.   2.  Severe MR s/p MV repair 2018.  I will repeat echo post MV repair for a baseline.  3.  HTN - BP is well controlled on exam today. She will continue on Lopressor 100mg  BID and HCTZ 25mg  daily.  Creatinine stable at 1.35 on 06/2017.  4.  Second degree AV block s/p PPM - followed in device clinic   Medication Adjustments/Labs and Tests Ordered: Current medicines are reviewed at length with the patient today.  Concerns regarding medicines are outlined above.  No orders of the defined types were placed in this encounter.  No orders of the defined types were placed in this encounter.   Signed, Armanda Magic, MD  08/05/2017 10:01 AM     [facility]

## 2017-08-05 ENCOUNTER — Ambulatory Visit: Payer: Medicare Other | Admitting: Cardiology

## 2017-08-05 ENCOUNTER — Encounter: Payer: Self-pay | Admitting: Cardiology

## 2017-08-05 VITALS — BP 142/88 | HR 95 | Ht 66.0 in | Wt 134.0 lb

## 2017-08-05 DIAGNOSIS — I10 Essential (primary) hypertension: Secondary | ICD-10-CM | POA: Diagnosis not present

## 2017-08-05 DIAGNOSIS — I480 Paroxysmal atrial fibrillation: Secondary | ICD-10-CM

## 2017-08-05 DIAGNOSIS — I340 Nonrheumatic mitral (valve) insufficiency: Secondary | ICD-10-CM | POA: Diagnosis not present

## 2017-08-05 DIAGNOSIS — I4430 Unspecified atrioventricular block: Secondary | ICD-10-CM | POA: Diagnosis not present

## 2017-08-05 NOTE — Patient Instructions (Signed)
Medication Instructions:  Your physician recommends that you continue on your current medications as directed. Please refer to the Current Medication list given to you today.  Labwork: None ordered   Testing/Procedures: Your physician has requested that you have an echocardiogram. Echocardiography is a painless test that uses sound waves to create images of your heart. It provides your doctor with information about the size and shape of your heart and how well your heart's chambers and valves are working. This procedure takes approximately one hour. There are no restrictions for this procedure.   Follow-Up: Your physician wants you to follow-up in: 6 months with [name]. You will receive a reminder letter in the mail two months in advance. If you don't receive a letter, please call our office to schedule the follow-up appointment.  Any Other Special Instructions Will Be Listed Below (If Applicable).     If you need a refill on your cardiac medications before your next appointment, please call your pharmacy.

## 2017-08-14 ENCOUNTER — Encounter: Payer: Self-pay | Admitting: Cardiology

## 2017-08-14 ENCOUNTER — Ambulatory Visit (HOSPITAL_COMMUNITY): Payer: Medicare Other | Attending: Cardiology

## 2017-08-14 ENCOUNTER — Other Ambulatory Visit: Payer: Self-pay

## 2017-08-14 DIAGNOSIS — I340 Nonrheumatic mitral (valve) insufficiency: Secondary | ICD-10-CM | POA: Diagnosis not present

## 2017-08-14 DIAGNOSIS — I519 Heart disease, unspecified: Secondary | ICD-10-CM | POA: Insufficient documentation

## 2017-08-14 DIAGNOSIS — I10 Essential (primary) hypertension: Secondary | ICD-10-CM | POA: Insufficient documentation

## 2017-08-15 ENCOUNTER — Telehealth: Payer: Self-pay

## 2017-08-15 DIAGNOSIS — R931 Abnormal findings on diagnostic imaging of heart and coronary circulation: Secondary | ICD-10-CM

## 2017-08-15 NOTE — Telephone Encounter (Signed)
Notes recorded by Phineas Semen, RN on 08/15/2017 at 12:25 PM EST Patient made aware of results. Patient verbalizes understanding and thankful for the all. Echo ordered to be scheduled in 6 months.   Notes recorded by Quintella Reichert, MD on 08/14/2017 at 5:01 PM EST Echo showed mildly reduced LVF with EF 45-50% with apical HK, increased stiffness of heart muscle, stable MV repair and mild pulmonary HTN. Compared to last echo in 2018, LVF slightly down - repeat echo in 6 months to make sure EF does not decline further with chronic RV pacing

## 2017-08-16 ENCOUNTER — Other Ambulatory Visit: Payer: Self-pay

## 2017-08-16 DIAGNOSIS — J90 Pleural effusion, not elsewhere classified: Secondary | ICD-10-CM

## 2017-08-17 ENCOUNTER — Other Ambulatory Visit: Payer: Self-pay | Admitting: Cardiology

## 2017-08-19 ENCOUNTER — Ambulatory Visit (INDEPENDENT_AMBULATORY_CARE_PROVIDER_SITE_OTHER): Payer: Medicare Other | Admitting: *Deleted

## 2017-08-19 ENCOUNTER — Encounter: Payer: Self-pay | Admitting: Thoracic Surgery (Cardiothoracic Vascular Surgery)

## 2017-08-19 ENCOUNTER — Ambulatory Visit (INDEPENDENT_AMBULATORY_CARE_PROVIDER_SITE_OTHER): Payer: Medicare Other | Admitting: Thoracic Surgery (Cardiothoracic Vascular Surgery)

## 2017-08-19 ENCOUNTER — Ambulatory Visit
Admission: RE | Admit: 2017-08-19 | Discharge: 2017-08-19 | Disposition: A | Discharge status: Home / Self Care | Payer: Medicare Other | Source: Ambulatory Visit | Attending: Thoracic Surgery (Cardiothoracic Vascular Surgery) | Admitting: Thoracic Surgery (Cardiothoracic Vascular Surgery)

## 2017-08-19 ENCOUNTER — Telehealth: Payer: Self-pay

## 2017-08-19 VITALS — BP 140/74 | HR 73 | Resp 20 | Ht 66.0 in | Wt 134.0 lb

## 2017-08-19 DIAGNOSIS — J90 Pleural effusion, not elsewhere classified: Secondary | ICD-10-CM

## 2017-08-19 DIAGNOSIS — Z8679 Personal history of other diseases of the circulatory system: Secondary | ICD-10-CM

## 2017-08-19 DIAGNOSIS — I441 Atrioventricular block, second degree: Secondary | ICD-10-CM

## 2017-08-19 DIAGNOSIS — Z9889 Other specified postprocedural states: Secondary | ICD-10-CM

## 2017-08-19 NOTE — Telephone Encounter (Signed)
Nurse, Debbie with [facility] was called and message left in regards to the changes with her Pleurx drainage to once weekly until drainage is low for 2 weeks and then catheter could possibly be removed afterwards.

## 2017-08-19 NOTE — Progress Notes (Signed)
[address]       [name] [address]             [phone]     CARDIOTHORACIC SURGERY OFFICE NOTE  Referring Provider is Quintella Reichert, MD PCP is [name], MD   HPI:  Patient is a 77 year old female with history of long-standing mitral valve prolapse and mitral regurgitation, hypertension, previous stroke, small vessel cerebrovascular disease with mild cognitive impairment, stage III chronic kidney disease secondary to glomerulonephritis and hypertension, heart block status post permanent pacemaker placement in the remote past, recurrent paroxysmal atrial fibrillation, hyperparathyroidism, hypothyroidism, interstitial cystitis with recurrent urinary tract infections, and hyperlipidemia who returns the office today for routine follow-up status post minimally invasive mitral valve repair and Maze procedure on April 16, 2017.  Her postoperative convalescence has been somewhat slow, and she underwent right Pleurx catheter placement on May 23, 2017 for recurrent pleural effusion.  She was last seen in follow-up in our office on June 24, 2017.  Since then she has done very well and she returns to the office today for routine follow-up with her husband and daughter present.  Output from her Pleurx catheter has continued to gradually decrease.  For the last 2 weeks the catheter has been draining twice weekly, each time with less than 150 mL of fluid.  The patient reports that she is doing exceptionally well.  She has no shortness of breath and she states that her exercise tolerance continues to improve.  Her appetite is much improved.  She feels as though she has essentially completely recovered.  She is not having palpitations or other symptoms to suggest recurrence of atrial fibrillation.  She hopes to have the Pleurx catheter removed soon.   Current Outpatient Medications  Medication Sig Dispense Refill  . acetaminophen (TYLENOL) 325 MG tablet Take 2  tablets (650 mg total) every 4 (four) hours as needed by mouth for mild pain (or Fever >/= 101). 50 tablet 2  . conjugated estrogens (PREMARIN) vaginal cream Place 1 Applicatorful daily vaginally.     . fluticasone (FLONASE) 50 MCG/ACT nasal spray Place 2 sprays daily as needed into both nostrils for allergies or rhinitis.     . hydrochlorothiazide (HYDRODIURIL) 25 MG tablet Take 1 tablet (25 mg total) daily by mouth. 30 tablet 2  . levothyroxine (SYNTHROID, LEVOTHROID) 75 MCG tablet Take 1 tablet (75 mcg total) by mouth See admin instructions. Take 75 mcg by mouth daily around 0300 (Patient taking differently: Take 75 mcg daily by mouth. ) 30 tablet 0  . potassium chloride (K-DUR) 10 MEQ tablet Take 10 mEq by mouth daily.     . Probiotic Product (PROBIOTIC DAILY PO) Take 1 tablet by mouth daily.    [name] warfarin (COUMADIN) 1 MG tablet Take 1 tablet (1 mg total) by mouth daily. (Patient taking differently: Take 1 mg every morning by mouth. ) 30 tablet 11  . metoprolol tartrate (LOPRESSOR) 100 MG tablet Take 1 tablet (100 mg total) 2 (two) times daily by mouth. 180 tablet 3   No current facility-administered medications for this visit.       Physical Exam:   BP 140/74   Pulse 73   Resp 20   Ht 5\' 6"  (1.676 m)   Wt 134 lb (60.8 kg)   SpO2 98% Comment: RA  BMI 21.63 kg/m   General:  Well-appearing  Chest:   Clear to auscultation  CV:   Regular rate and rhythm without  murmur  Incisions:  Completely healed  Abdomen:  Soft nontender  Extremities:  Warm and well perfused, no edema  Diagnostic Tests:  CHEST  2 VIEW  COMPARISON:  06/24/2017  FINDINGS: Left-sided pacemaker unchanged. Small caliber right-sided thoracic catheter is present with tip adjacent the midline mediastinum at the T8-9 level. Lungs are adequately inflated with interval improvement in near resolution of the previously seen right pleural effusion. Remainder the lungs are clear with minimal scarring over the  right midlung. Cardiomediastinal silhouette and remainder of the exam is unchanged.  IMPRESSION: Interval resolution of previously seen small right pleural effusion.  Small caliber right thoracic catheter as described.   Electronically Signed   By: Elberta Fortis M.D.   On: 08/19/2017 13:10   2 channel telemetry rhythm strip demonstrates sinus rhythm with V pacing   Impression:  Patient is doing well and maintaining sinus rhythm more than 4 months status post minimally invasive mitral valve repair and Maze procedure.  The drainage from her Pleurx catheter has gradually decreased to the point where it probably can be removed soon.  Clinically the patient looks very good.  Plan:  We have not recommended any changes to the patient's current medications.  We will instruct home health nursing to decrease the frequency of drainage of the patient's Pleurx catheter to once a week.  If the output remains low over the next 2 weeks we will have the catheter removed.  I have encouraged the patient to continue to increase her physical activity without any particular limitations.  She might benefit from participation in the outpatient cardiac rehab program.  Patient will return to our office for follow-up and rhythm check next fall, approximately 1 year following her surgery.  She will call and return sooner should specific problems or questions arise.  I spent in excess of 15 minutes during the conduct of this office consultation and >50% of this time involved direct face-to-face encounter with the patient for counseling and/or coordination of their care.    Salvatore Decent. Cornelius Moras, MD 08/19/2017 1:30 PM

## 2017-08-19 NOTE — Progress Notes (Signed)
Remote pacemaker transmission.   

## 2017-08-19 NOTE — Patient Instructions (Signed)

## 2017-08-21 ENCOUNTER — Encounter: Payer: Self-pay | Admitting: Cardiology

## 2017-08-21 ENCOUNTER — Ambulatory Visit (INDEPENDENT_AMBULATORY_CARE_PROVIDER_SITE_OTHER): Payer: Medicare Other | Admitting: Pharmacist

## 2017-08-21 DIAGNOSIS — Z7901 Long term (current) use of anticoagulants: Secondary | ICD-10-CM | POA: Diagnosis not present

## 2017-08-21 DIAGNOSIS — Z8673 Personal history of transient ischemic attack (TIA), and cerebral infarction without residual deficits: Secondary | ICD-10-CM | POA: Diagnosis not present

## 2017-08-21 DIAGNOSIS — I480 Paroxysmal atrial fibrillation: Secondary | ICD-10-CM | POA: Diagnosis not present

## 2017-08-21 LAB — POCT INR: INR: 2.2

## 2017-08-27 LAB — CUP PACEART REMOTE DEVICE CHECK
Battery Remaining Percentage: 95.5 %
Brady Statistic AP VP Percent: 11 %
Brady Statistic AP VS Percent: 1 %
Brady Statistic AS VP Percent: 83 %
Brady Statistic AS VS Percent: 1.3 %
Brady Statistic RV Percent Paced: 94 %
Date Time Interrogation Session: 20190211070016
Implantable Lead Implant Date: 20160701
Implantable Lead Location: 753860
Lead Channel Impedance Value: 560 Ohm
Lead Channel Pacing Threshold Amplitude: 1.125 V
Lead Channel Pacing Threshold Pulse Width: 0.4 ms
Lead Channel Sensing Intrinsic Amplitude: 12 mV
Lead Channel Setting Pacing Amplitude: 1.375
Lead Channel Setting Pacing Amplitude: 2 V
Lead Channel Setting Pacing Pulse Width: 0.4 ms
Lead Channel Setting Sensing Sensitivity: 2 mV
MDC IDC LEAD IMPLANT DT: 20160701
MDC IDC LEAD LOCATION: 753859
MDC IDC MSMT BATTERY REMAINING LONGEVITY: 114 mo
MDC IDC MSMT BATTERY VOLTAGE: 3.01 V
MDC IDC MSMT LEADCHNL RA IMPEDANCE VALUE: 360 Ohm
MDC IDC MSMT LEADCHNL RA PACING THRESHOLD AMPLITUDE: 0.75 V
MDC IDC MSMT LEADCHNL RA PACING THRESHOLD PULSEWIDTH: 0.4 ms
MDC IDC MSMT LEADCHNL RA SENSING INTR AMPL: 3.6 mV
MDC IDC PG IMPLANT DT: 20160701
MDC IDC STAT BRADY RA PERCENT PACED: 8.9 %
Pulse Gen Model: 2240
Pulse Gen Serial Number: 7785935

## 2017-08-30 ENCOUNTER — Telehealth: Payer: Self-pay | Admitting: *Deleted

## 2017-08-30 ENCOUNTER — Other Ambulatory Visit: Payer: Self-pay | Admitting: *Deleted

## 2017-08-30 DIAGNOSIS — J90 Pleural effusion, not elsewhere classified: Secondary | ICD-10-CM

## 2017-08-30 NOTE — Telephone Encounter (Signed)
Patient is scheduled for Right pleurex removal on Tuesday 2/26 @ 12:30pm in the [facility].  Patient knows to bring 1 bottle with her and to eat a light breakfast.  Per Dr. Cornelius Moras she will stop her coumadin 4 days before so she will not take anymore as of today.  This needs to be restarted after removal per the [facility] PA/Dr. Cornelius Moras.  Patient understands and has no further questions.

## 2017-09-02 ENCOUNTER — Telehealth: Payer: Self-pay | Admitting: *Deleted

## 2017-09-02 NOTE — Telephone Encounter (Signed)
Spoke with Eunice Blase nurse with [facility] who states pt is having removal of Plerual drainage catheter tomorrow Feb 26th and had been instructed to hold coumadin for 4 days prior. Instructed unless informed other wise by the physician doing procedure to restart coumadin the night of the procedure and recheck INR in 1 week March 5th Debbie states she will call if anything different occurs

## 2017-09-03 ENCOUNTER — Ambulatory Visit (HOSPITAL_COMMUNITY)
Admission: RE | Admit: 2017-09-03 | Discharge: 2017-09-03 | Disposition: A | Discharge status: Home / Self Care | Payer: Medicare Other | Source: Ambulatory Visit | Attending: Thoracic Surgery (Cardiothoracic Vascular Surgery) | Admitting: Thoracic Surgery (Cardiothoracic Vascular Surgery)

## 2017-09-03 ENCOUNTER — Encounter (HOSPITAL_COMMUNITY)
Admission: RE | Disposition: A | Discharge status: Home / Self Care | Payer: Self-pay | Source: Ambulatory Visit | Attending: Thoracic Surgery (Cardiothoracic Vascular Surgery)

## 2017-09-03 DIAGNOSIS — E039 Hypothyroidism, unspecified: Secondary | ICD-10-CM | POA: Diagnosis not present

## 2017-09-03 DIAGNOSIS — Z79899 Other long term (current) drug therapy: Secondary | ICD-10-CM | POA: Insufficient documentation

## 2017-09-03 DIAGNOSIS — I480 Paroxysmal atrial fibrillation: Secondary | ICD-10-CM | POA: Insufficient documentation

## 2017-09-03 DIAGNOSIS — I340 Nonrheumatic mitral (valve) insufficiency: Secondary | ICD-10-CM | POA: Insufficient documentation

## 2017-09-03 DIAGNOSIS — Z87440 Personal history of urinary (tract) infections: Secondary | ICD-10-CM | POA: Diagnosis not present

## 2017-09-03 DIAGNOSIS — Z4682 Encounter for fitting and adjustment of non-vascular catheter: Secondary | ICD-10-CM | POA: Insufficient documentation

## 2017-09-03 DIAGNOSIS — N183 Chronic kidney disease, stage 3 (moderate): Secondary | ICD-10-CM | POA: Diagnosis not present

## 2017-09-03 DIAGNOSIS — Z7901 Long term (current) use of anticoagulants: Secondary | ICD-10-CM | POA: Insufficient documentation

## 2017-09-03 DIAGNOSIS — G3184 Mild cognitive impairment, so stated: Secondary | ICD-10-CM | POA: Diagnosis not present

## 2017-09-03 DIAGNOSIS — I459 Conduction disorder, unspecified: Secondary | ICD-10-CM | POA: Diagnosis not present

## 2017-09-03 DIAGNOSIS — Z950 Presence of cardiac pacemaker: Secondary | ICD-10-CM | POA: Insufficient documentation

## 2017-09-03 DIAGNOSIS — I341 Nonrheumatic mitral (valve) prolapse: Secondary | ICD-10-CM | POA: Insufficient documentation

## 2017-09-03 DIAGNOSIS — N2581 Secondary hyperparathyroidism of renal origin: Secondary | ICD-10-CM | POA: Insufficient documentation

## 2017-09-03 DIAGNOSIS — I129 Hypertensive chronic kidney disease with stage 1 through stage 4 chronic kidney disease, or unspecified chronic kidney disease: Secondary | ICD-10-CM | POA: Diagnosis not present

## 2017-09-03 DIAGNOSIS — Z8673 Personal history of transient ischemic attack (TIA), and cerebral infarction without residual deficits: Secondary | ICD-10-CM | POA: Diagnosis not present

## 2017-09-03 DIAGNOSIS — N3010 Interstitial cystitis (chronic) without hematuria: Secondary | ICD-10-CM | POA: Insufficient documentation

## 2017-09-03 DIAGNOSIS — J90 Pleural effusion, not elsewhere classified: Secondary | ICD-10-CM

## 2017-09-03 DIAGNOSIS — I7389 Other specified peripheral vascular diseases: Secondary | ICD-10-CM | POA: Insufficient documentation

## 2017-09-03 HISTORY — PX: REMOVAL OF PLEURAL DRAINAGE CATHETER: SHX5080

## 2017-09-03 SURGERY — REMOVAL, CLOSED DRAINAGE CATHETER SYSTEM, PLEURAL
Anesthesia: Monitor Anesthesia Care | Laterality: Right

## 2017-09-03 MED ORDER — LIDOCAINE HCL (PF) 1 % IJ SOLN
INTRAMUSCULAR | Status: AC
  Filled 2017-09-03: qty 5

## 2017-09-03 NOTE — Progress Notes (Addendum)
Discharge instructions given to pt by Esmond Plants, PA-C Discharged home with her daughter and husband. Alert and orient. No C/o pain voiced at this time.

## 2017-09-03 NOTE — Progress Notes (Signed)
      [address]       [name] [address]             [phone]     Brief procedure Note   Pleurx removal(R)  Technique- Routine  Local anest: 8 cc 2 % lidocaine  Prep: betadine  Catheter removed intact. Drained 250 cc prior to removal, hadn't been drained in 7 days and previous totals for last several drainings has been <100cc  Patient tolerated well    Rowe Clack, PA-C

## 2017-09-04 ENCOUNTER — Telehealth: Payer: Self-pay | Admitting: *Deleted

## 2017-09-04 NOTE — H&P (Signed)
Patient for Pleurx removal See progress note for details  Rowe Clack, PA-C

## 2017-09-04 NOTE — Telephone Encounter (Signed)
Spoke with Eunice Blase Nurse with [facility] while in the home with the pt and she states that pt had her Pleural catheter removed yesterday and did have some bleeding after catheter removed but this has stopped  . She did not restart coumadin last night so instructed to restart coumadin today at same dose 1 mg daily except 2mg  on Sundays and Wednesdays and to recheck INR in 1 week  Debbie instructed pt regarding these orders

## 2017-09-05 ENCOUNTER — Telehealth: Payer: Self-pay

## 2017-09-05 ENCOUNTER — Encounter (HOSPITAL_COMMUNITY): Payer: Self-pay | Admitting: Thoracic Surgery (Cardiothoracic Vascular Surgery)

## 2017-09-05 NOTE — Telephone Encounter (Signed)
Amanda [name] with [facility] called today to get verbal orders to have Amanda [name]'s chest sutures removed, when appropriate, at home by home health.  Patient requested to not go out of home.  Verbal order given and to be removed by home health.  Will continue to monitor.

## 2017-09-11 ENCOUNTER — Ambulatory Visit (INDEPENDENT_AMBULATORY_CARE_PROVIDER_SITE_OTHER): Payer: Medicare Other | Admitting: Cardiovascular Disease

## 2017-09-11 DIAGNOSIS — Z7901 Long term (current) use of anticoagulants: Secondary | ICD-10-CM | POA: Diagnosis not present

## 2017-09-11 DIAGNOSIS — I480 Paroxysmal atrial fibrillation: Secondary | ICD-10-CM | POA: Diagnosis not present

## 2017-09-11 DIAGNOSIS — Z8673 Personal history of transient ischemic attack (TIA), and cerebral infarction without residual deficits: Secondary | ICD-10-CM | POA: Diagnosis not present

## 2017-09-11 LAB — POCT INR: INR: 1.9

## 2017-09-11 NOTE — Patient Instructions (Signed)
Description   Spoke with Eunice Blase RN with [facility] while in the home with the pt & instructed to have pt take 2.5mg  today then continue 1mg  daily except 2 mg on Sundays and Wednesdays.  Recheck INR in 1 week.

## 2017-09-13 ENCOUNTER — Telehealth (HOSPITAL_COMMUNITY): Payer: Self-pay

## 2017-09-13 NOTE — Telephone Encounter (Signed)
Called to follow up with patient in regards to catheter - catheter was pulled on 09/10/2017. Passed referral to RN Navigator for review.

## 2017-09-18 ENCOUNTER — Telehealth (HOSPITAL_COMMUNITY): Payer: Self-pay

## 2017-09-18 ENCOUNTER — Ambulatory Visit (INDEPENDENT_AMBULATORY_CARE_PROVIDER_SITE_OTHER): Payer: Medicare Other | Admitting: Cardiovascular Disease

## 2017-09-18 DIAGNOSIS — Z9889 Other specified postprocedural states: Secondary | ICD-10-CM | POA: Diagnosis not present

## 2017-09-18 DIAGNOSIS — Z8679 Personal history of other diseases of the circulatory system: Secondary | ICD-10-CM

## 2017-09-18 DIAGNOSIS — Z7901 Long term (current) use of anticoagulants: Secondary | ICD-10-CM | POA: Diagnosis not present

## 2017-09-18 DIAGNOSIS — I480 Paroxysmal atrial fibrillation: Secondary | ICD-10-CM | POA: Diagnosis not present

## 2017-09-18 DIAGNOSIS — Z8673 Personal history of transient ischemic attack (TIA), and cerebral infarction without residual deficits: Secondary | ICD-10-CM | POA: Diagnosis not present

## 2017-09-18 DIAGNOSIS — Z5181 Encounter for therapeutic drug level monitoring: Secondary | ICD-10-CM

## 2017-09-18 LAB — POCT INR: INR: 2.2

## 2017-09-18 NOTE — Telephone Encounter (Signed)
Called to schedule patient for Cardiac Rehab - Patient stated she is going to a Dr appt today to check out incision that is not healing right. Patient will give me a call after dr appt. Will follow up if no phone call.

## 2017-09-18 NOTE — Patient Instructions (Signed)
Description   Spoke with Eunice Blase RN with [facility] while in the home with the pt & instructed to have pt  continue 1mg  daily except 2 mg on Sundays and Wednesdays.  Recheck INR on Friday March 15th as she is on Levaquin

## 2017-09-19 ENCOUNTER — Telehealth: Payer: Self-pay | Admitting: Internal Medicine

## 2017-09-19 NOTE — Telephone Encounter (Signed)
Spoke with [name] at [facility] and she states she does not see where they need order for INR and she states will have Amanda [name] call me in the morning regarding this

## 2017-09-19 NOTE — Telephone Encounter (Signed)
Correction nurse is Catering manager rather than [employer]

## 2017-09-19 NOTE — Telephone Encounter (Signed)
New Message    Needs order for [facility] signed and sent back to them

## 2017-09-20 ENCOUNTER — Ambulatory Visit (INDEPENDENT_AMBULATORY_CARE_PROVIDER_SITE_OTHER): Payer: Medicare Other | Admitting: Cardiovascular Disease

## 2017-09-20 DIAGNOSIS — Z9889 Other specified postprocedural states: Secondary | ICD-10-CM | POA: Diagnosis not present

## 2017-09-20 DIAGNOSIS — Z8673 Personal history of transient ischemic attack (TIA), and cerebral infarction without residual deficits: Secondary | ICD-10-CM | POA: Diagnosis not present

## 2017-09-20 DIAGNOSIS — Z7901 Long term (current) use of anticoagulants: Secondary | ICD-10-CM | POA: Diagnosis not present

## 2017-09-20 DIAGNOSIS — I480 Paroxysmal atrial fibrillation: Secondary | ICD-10-CM | POA: Diagnosis not present

## 2017-09-20 DIAGNOSIS — Z5181 Encounter for therapeutic drug level monitoring: Secondary | ICD-10-CM

## 2017-09-20 DIAGNOSIS — Z8679 Personal history of other diseases of the circulatory system: Secondary | ICD-10-CM

## 2017-09-20 LAB — POCT INR: INR: 3.4

## 2017-09-20 NOTE — Patient Instructions (Signed)
Description   Spoke with Eunice Blase RN with [facility] while in the home with the pt & instructed to have pt hold coumadin today March 15th and take coumadin 1mg  on Saturday and on Sunday March 17th take 1mg  then  continue 1mg  daily except 2 mg on Sundays and Wednesdays.  Recheck INR on Thursday March 21st as she will finish  Levaquin on Sunday March 17th Instructed to eat serving of greens today and daily until finishes Levaquin on Sunday

## 2017-09-20 NOTE — Telephone Encounter (Signed)
Spoke with Burnett Harry and she states needs orders signed  for 06/05/2017  06/19/2017  07/08/2017  07/17/2017  08/19/2017. She states will send these orders over today

## 2017-09-23 ENCOUNTER — Other Ambulatory Visit: Payer: Self-pay

## 2017-09-23 ENCOUNTER — Ambulatory Visit: Payer: Medicare Other | Admitting: Thoracic Surgery (Cardiothoracic Vascular Surgery)

## 2017-09-23 VITALS — BP 138/68 | HR 73 | Resp 18 | Ht 66.0 in | Wt 135.0 lb

## 2017-09-23 DIAGNOSIS — Z4889 Encounter for other specified surgical aftercare: Secondary | ICD-10-CM

## 2017-09-23 NOTE — Progress Notes (Signed)
[address]       [place] [address]             [phone]      HELMA ACHEN [facility] Medical Record #[id number] Date of Birth: 16-May-1941  Referring: Quintella Reichert, MD Primary Care: [name], MD Primary Cardiologist: No primary care provider on file.   Chief Complaint:   POST OP FOLLOW UP  History of Present Illness:    77 year old female status post mini mitral valve repair and Maze procedure last October.  She had a persistent right pleural effusion which necessitated placement of a Pleurx drain.  The drainage decreased significantly over time and it was removed on 08/15/2017.  Currently she reports that she has had some slightly purulent drainage with erythema at the Pleurx catheter insertion site.  She saw her primary care physician who did a small incision and drainage procedure.  Culture showed MRSA and she was given mupirocin for twice daily dressing changes.  She is seen in the office on today's date to check the wound.  She denies fevers, chills or other constitutional symptoms.  She describes the drainage as minimal when she changes the dressing in the morning before her shower.  She otherwise feels as though she is doing quite well.      Past Medical History:  Diagnosis Date  . Allergic rhinitis   . Anemia   . Asymptomatic LV dysfunction    EF 45-50% echo 07/2017  . CKD (chronic kidney disease), stage III (HCC)    stage III  . DJD (degenerative joint disease)   . Glomerulonephritis    Dr [name]  . Hyperlipidemia   . Hyperparathyroidism (HCC)   . Hypertension   . Hypothyroidism   . IBS (irritable bowel syndrome)   . Interstitial cystitis   . Mitral regurgitation   . Mobitz II    a. s/p STJ dual chamber PPM   . MVP (mitral valve prolapse)    moderate posterior MVP with moderate MR and grade II diasotlic dysfunction  . PAC (premature atrial contraction)   . PAF (paroxysmal atrial fibrillation) (HCC)     note on pacer  check. CHADS2VASC score is 4 now on Eliquis.  [name] PVC's (premature ventricular contractions)   . S/P minimally invasive maze operation for atrial fibrillation 04/16/2017   Complete bilateral atrial lesion set using cryothermy and bipolar radiofrequency ablation with clipping of LA appendage via right mini thoracotomy approach  . S/P minimally invasive mitral valve repair 04/16/2017   Complex valvuloplasty including triangular resection of posterior leaflet, artificial Gore-tex neochords x6 and Sorin Memo 3D ring annuloplasty ([id number], size 32, serial # [id number])  . S/P placement of cardiac pacemaker   . Small vessel disease, cerebrovascular   . Stroke [facility])    a. old stroke seen on imaging.     Social History   Tobacco Use  Smoking Status Former Smoker  Smokeless Tobacco Never Used    Social History   Substance and Sexual Activity  Alcohol Use No     Allergies  Allergen Reactions  . Pepcid [Famotidine] Anaphylaxis, Swelling and Other (See Comments)    Causes "Throat Swelling"   . Allopurinol Other (See Comments)    Caused headaches  . Amlodipine Other (See Comments)    Results in PEDAL EDEMA   . Atorvastatin Other (See Comments)    Causes her leg muscles to ache  . Contrast Media [Iodinated Diagnostic Agents] Hives  IVP dye per patient  . Cephalexin Itching and Rash  . Ciprofloxacin Itching and Rash  . Colchicine Rash  . Erythromycin Itching and Rash  . Prilosec [Omeprazole] Nausea Only  . Sulfonamide Derivatives Itching and Rash  . Tetracycline Itching and Rash    Current Outpatient Medications  Medication Sig Dispense Refill  . acetaminophen (TYLENOL) 325 MG tablet Take 2 tablets (650 mg total) every 4 (four) hours as needed by mouth for mild pain (or Fever >/= 101). 50 tablet 2  . acetaminophen (TYLENOL) 500 MG tablet Take 500 mg by mouth 2 (two) times daily.    . calcitRIOL (ROCALTROL) 0.25 MCG capsule Take 0.25 mcg by mouth every other day. In the morning    .  conjugated estrogens (PREMARIN) vaginal cream Place 1 Applicatorful vaginally at bedtime.     . fluticasone (FLONASE) 50 MCG/ACT nasal spray Place 2 sprays into both nostrils daily.     . hydrochlorothiazide (HYDRODIURIL) 25 MG tablet TAKE ONE TABLET DAILY (Patient taking differently: TAKE 1 TABLET (25 MG) BY MOUTH ONCE AT NIGHT.) 90 tablet 3  . levocetirizine (XYZAL) 5 MG tablet Take 5 mg by mouth daily as needed for allergies.    [name] levothyroxine (SYNTHROID, LEVOTHROID) 75 MCG tablet Take 1 tablet (75 mcg total) by mouth See admin instructions. Take 75 mcg by mouth daily around 0300 (Patient taking differently: Take 75 mcg by mouth daily at 2 am. (0300)) 30 tablet 0  . metoprolol tartrate (LOPRESSOR) 100 MG tablet Take 1 tablet (100 mg total) 2 (two) times daily by mouth. 180 tablet 3  . naproxen sodium (ALEVE) 220 MG tablet Take 220 mg by mouth daily as needed (for severe back pain.).    [name] Probiotic Product (PROBIOTIC DAILY PO) Take 1 capsule by mouth 2 (two) times a week. In the morning    . vitamin B-12 (CYANOCOBALAMIN) 1000 MCG tablet Take 1,000 mcg by mouth once a week.    . warfarin (COUMADIN) 1 MG tablet Take 1 tablet (1 mg total) by mouth daily. (Patient taking differently: Take 1 mg by mouth every evening. ) 30 tablet 11   No current facility-administered medications for this visit.        Physical Exam: BP 138/68 (BP Location: Right Arm, Patient Position: Sitting, Cuff Size: Normal)   Pulse 73   Resp 18   Ht 5\' 6"  (1.676 m)   Wt 135 lb (61.2 kg)   SpO2 97% Comment: RA  BMI 21.79 kg/m   General appearance: alert, cooperative and no distress Wound: The wound is approximately 1.5 cm in diameter.  There is some thickened scabbing and mild induration.  There is currently no obvious purulence noted.  There is no cellulitis.   Diagnostic Studies & Laboratory data:     Recent Radiology Findings:   No results found.    Recent Lab Findings: Lab Results  Component Value Date    WBC 8.4 05/23/2017   HGB 10.3 (L) 05/23/2017   HCT 32.3 (L) 05/23/2017   PLT 233 05/23/2017   GLUCOSE 105 (H) 05/24/2017   CHOL 153 01/07/2015   TRIG 197 (H) 04/22/2017   HDL 51 01/07/2015   LDLCALC 89 01/07/2015   ALT 16 05/22/2017   AST 29 05/22/2017   NA 137 05/24/2017   K 3.2 (L) 05/24/2017   CL 103 05/24/2017   CREATININE 1.26 (H) 05/24/2017   BUN 9 05/24/2017   CO2 31 05/24/2017   TSH 2.010 05/17/2017   INR 3.4 09/20/2017  HGBA1C 5.8 (H) 04/12/2017      Assessment / Plan: The wound is doing well on the current treatment plan.  Does not appear to require antibiotics as the skin changes appear primarily inflammatory in nature and the wound was probed and is less than 0.5 cm deep.  We will continue current dressing change and plan and use of mupirocin and recheck the wound in 1 week.  If she were to have any worsening signs or symptoms prior to that she knows to call the office immediately.          Rowe Clack, PA-C 09/23/2017 1:37 PM

## 2017-09-23 NOTE — Patient Instructions (Signed)
Continue current dressing changes and follow-up in 1 week

## 2017-09-24 ENCOUNTER — Telehealth (HOSPITAL_COMMUNITY): Payer: Self-pay

## 2017-09-24 NOTE — Telephone Encounter (Signed)
Patient returned phone call and scheduled orientation for CR on 11/14/2017 at 8:45am. Patient will participate in the 11:15am exc class. Went over insurance with patient and patient verbally stated she understands what she is responsible for. Mailed packet.

## 2017-09-26 ENCOUNTER — Ambulatory Visit (INDEPENDENT_AMBULATORY_CARE_PROVIDER_SITE_OTHER): Payer: Medicare Other | Admitting: Pharmacist

## 2017-09-26 DIAGNOSIS — Z8673 Personal history of transient ischemic attack (TIA), and cerebral infarction without residual deficits: Secondary | ICD-10-CM | POA: Diagnosis not present

## 2017-09-26 DIAGNOSIS — I480 Paroxysmal atrial fibrillation: Secondary | ICD-10-CM | POA: Diagnosis not present

## 2017-09-26 DIAGNOSIS — Z5181 Encounter for therapeutic drug level monitoring: Secondary | ICD-10-CM | POA: Diagnosis not present

## 2017-09-26 DIAGNOSIS — Z7901 Long term (current) use of anticoagulants: Secondary | ICD-10-CM | POA: Diagnosis not present

## 2017-09-26 LAB — POCT INR: INR: 2.2

## 2017-09-30 ENCOUNTER — Ambulatory Visit: Payer: Medicare Other | Admitting: Physician Assistant

## 2017-09-30 ENCOUNTER — Other Ambulatory Visit: Payer: Self-pay

## 2017-09-30 ENCOUNTER — Encounter: Payer: Self-pay | Admitting: Physician Assistant

## 2017-09-30 VITALS — BP 133/82 | HR 70 | Resp 16 | Ht 66.0 in | Wt 135.0 lb

## 2017-09-30 DIAGNOSIS — Z9889 Other specified postprocedural states: Secondary | ICD-10-CM

## 2017-09-30 DIAGNOSIS — Z4889 Encounter for other specified surgical aftercare: Secondary | ICD-10-CM

## 2017-09-30 NOTE — Progress Notes (Signed)
Amanda [name] is a 77 year old female status post mini mitral valve repair and Maze procedure last October.  She had a persistent right pleural effusion which necessitated placement of a Pleurx drain.  The drainage decreased significantly over time and it was removed on 08/15/2017.  Currently she reports that she has had some slightly purulent drainage with erythema at the Pleurx catheter insertion site.  She saw her primary care physician who did a small incision and drainage procedure.  Culture showed MRSA and she was given mupirocin for twice daily dressing changes. She was seen in the office on September 23, 2017 for a wound check. She denies fevers, chills or other constitutional symptoms.  She describes the drainage as minimal when she changes the dressing in the morning before her shower.  She otherwise feels as though she is doing quite well.  Today, she returns for another wound check.     1. Encounter for post surgical wound check   2. S/P minimally invasive mitral valve repair + maze procedure    Past Medical History:  Diagnosis Date  . Allergic rhinitis   . Anemia   . Asymptomatic LV dysfunction    EF 45-50% echo 07/2017  . CKD (chronic kidney disease), stage III (HCC)    stage III  . DJD (degenerative joint disease)   . Glomerulonephritis    Dr [name]  . Hyperlipidemia   . Hyperparathyroidism (HCC)   . Hypertension   . Hypothyroidism   . IBS (irritable bowel syndrome)   . Interstitial cystitis   . Mitral regurgitation   . Mobitz II    a. s/p STJ dual chamber PPM   . MVP (mitral valve prolapse)    moderate posterior MVP with moderate MR and grade II diasotlic dysfunction  . PAC (premature atrial contraction)   . PAF (paroxysmal atrial fibrillation) (HCC)     note on pacer check. CHADS2VASC score is 4 now on Eliquis.  [name] PVC's (premature ventricular contractions)   . S/P minimally invasive maze operation for atrial fibrillation 04/16/2017   Complete bilateral atrial  lesion set using cryothermy and bipolar radiofrequency ablation with clipping of LA appendage via right mini thoracotomy approach  . S/P minimally invasive mitral valve repair 04/16/2017   Complex valvuloplasty including triangular resection of posterior leaflet, artificial Gore-tex neochords x6 and Sorin Memo 3D ring annuloplasty ([id number], size 32, serial # [id number])  . S/P placement of cardiac pacemaker   . Small vessel disease, cerebrovascular   . Stroke [facility])    a. old stroke seen on imaging.   No past surgical history pertinent negatives on file. Scheduled Meds: Current Outpatient Medications on File Prior to Visit  Medication Sig Dispense Refill  . acetaminophen (TYLENOL) 325 MG tablet Take 2 tablets (650 mg total) every 4 (four) hours as needed by mouth for mild pain (or Fever >/= 101). 50 tablet 2  . acetaminophen (TYLENOL) 500 MG tablet Take 500 mg by mouth 2 (two) times daily.    . calcitRIOL (ROCALTROL) 0.25 MCG capsule Take 0.25 mcg by mouth every other day. In the morning    . conjugated estrogens (PREMARIN) vaginal cream Place 1 Applicatorful vaginally at bedtime.     . fluticasone (FLONASE) 50 MCG/ACT nasal spray Place 2 sprays into both nostrils daily.     . hydrochlorothiazide (HYDRODIURIL) 25 MG tablet TAKE ONE TABLET DAILY (Patient taking differently: TAKE 1 TABLET (25 MG) BY MOUTH ONCE AT NIGHT.) 90 tablet 3  . levocetirizine (XYZAL) 5 MG tablet  Take 5 mg by mouth daily as needed for allergies.    [name] levothyroxine (SYNTHROID, LEVOTHROID) 75 MCG tablet Take 1 tablet (75 mcg total) by mouth See admin instructions. Take 75 mcg by mouth daily around 0300 (Patient taking differently: Take 75 mcg by mouth daily at 2 am. (0300)) 30 tablet 0  . metoprolol tartrate (LOPRESSOR) 100 MG tablet Take 1 tablet (100 mg total) 2 (two) times daily by mouth. 180 tablet 3  . naproxen sodium (ALEVE) 220 MG tablet Take 220 mg by mouth daily as needed (for severe back pain.).    [name] Probiotic Product  (PROBIOTIC DAILY PO) Take 1 capsule by mouth 2 (two) times a week. In the morning    . vitamin B-12 (CYANOCOBALAMIN) 1000 MCG tablet Take 1,000 mcg by mouth once a week.    . warfarin (COUMADIN) 1 MG tablet Take 1 tablet (1 mg total) by mouth daily. (Patient taking differently: Take 1 mg by mouth every evening. ) 30 tablet 11   No current facility-administered medications on file prior to visit.     Allergies  Allergen Reactions  . Pepcid [Famotidine] Anaphylaxis, Swelling and Other (See Comments)    Causes "Throat Swelling"   . Allopurinol Other (See Comments)    Caused headaches  . Amlodipine Other (See Comments)    Results in PEDAL EDEMA   . Atorvastatin Other (See Comments)    Causes her leg muscles to ache  . Contrast Media [Iodinated Diagnostic Agents] Hives    IVP dye per patient  . Cephalexin Itching and Rash  . Ciprofloxacin Itching and Rash  . Colchicine Rash  . Erythromycin Itching and Rash  . Prilosec [Omeprazole] Nausea Only  . Sulfonamide Derivatives Itching and Rash  . Tetracycline Itching and Rash     Blood pressure 133/82, pulse 70, resp. rate 16, height 5\' 6"  (1.676 m), weight 135 lb (61.2 kg), SpO2 97 %.  Subjective the patient presents today for a Pleurx catheter wound check.  She has been changing her dressing once a day after her shower.  She has hardly any drainage from the wound.  Today the wound has a small amount of erythema however it is completely sealed.  She has been using the mupirocin for a total of 7 days and I asked her to continue treatment for 3 more days to total 10 days of treatment.  She feels that the incision is healing nicely.  The daughter was able to show me a photo of the wound in its previous stage and it has significantly improved.   Objective  Cor: RRR, no murmur Pulm: CTA bilaterally Abd: no tenderness Ext:No edema Wound: Almost completely healed Pleurx catheter insertion site.  There was a small amount of erythema superior to  the incision.  There is no longer an opening and the skin appears healthy.  Assessment & Plan  I instructed the patient to continue the mupirocin for 3 more days, totaling 10 days of therapy.  If the wound becomes more red and irritated or the drainage increases then they need to call our office so she can be seen.  I did not schedule any routine follow-up at this time since the wound is almost completely healed.  She can change the dressing once a day after showering if there is drainage.  At this point since there is not much drainage it is okay to use the ointment and then cover the incision with a Band-Aid to keep the ointment from getting on her  clothing.  Again, if they have any questions or concerns they are encouraged to call our office.  She may follow-up as needed.  Sharlene Dory 09/30/2017

## 2017-10-03 ENCOUNTER — Ambulatory Visit (INDEPENDENT_AMBULATORY_CARE_PROVIDER_SITE_OTHER): Payer: Medicare Other | Admitting: Internal Medicine

## 2017-10-03 DIAGNOSIS — Z8673 Personal history of transient ischemic attack (TIA), and cerebral infarction without residual deficits: Secondary | ICD-10-CM

## 2017-10-03 DIAGNOSIS — Z7901 Long term (current) use of anticoagulants: Secondary | ICD-10-CM | POA: Diagnosis not present

## 2017-10-03 DIAGNOSIS — Z5181 Encounter for therapeutic drug level monitoring: Secondary | ICD-10-CM | POA: Diagnosis not present

## 2017-10-03 DIAGNOSIS — I480 Paroxysmal atrial fibrillation: Secondary | ICD-10-CM

## 2017-10-03 LAB — POCT INR: INR: 3

## 2017-10-09 ENCOUNTER — Ambulatory Visit (INDEPENDENT_AMBULATORY_CARE_PROVIDER_SITE_OTHER): Payer: Medicare Other | Admitting: *Deleted

## 2017-10-09 DIAGNOSIS — I480 Paroxysmal atrial fibrillation: Secondary | ICD-10-CM

## 2017-10-09 DIAGNOSIS — Z5181 Encounter for therapeutic drug level monitoring: Secondary | ICD-10-CM | POA: Diagnosis not present

## 2017-10-09 DIAGNOSIS — Z8673 Personal history of transient ischemic attack (TIA), and cerebral infarction without residual deficits: Secondary | ICD-10-CM | POA: Diagnosis not present

## 2017-10-09 DIAGNOSIS — Z7901 Long term (current) use of anticoagulants: Secondary | ICD-10-CM

## 2017-10-09 LAB — POCT INR: INR: 3.2

## 2017-10-09 NOTE — Patient Instructions (Signed)
Description   Change your dose to 1 tablet daily except 2 tablets on Sundays.   Recheck INR in 1 week. Call us with medication changes or concerns, Main # [phone]. [facility] # [phone].

## 2017-10-11 ENCOUNTER — Telehealth: Payer: Self-pay | Admitting: *Deleted

## 2017-10-11 NOTE — Telephone Encounter (Signed)
Daughter called to inform us that Dr Deterding has stopped pts HCTZ and has added Potassium BID for 5 days.

## 2017-10-14 ENCOUNTER — Telehealth: Payer: Self-pay | Admitting: *Deleted

## 2017-10-14 NOTE — Telephone Encounter (Signed)
Spoke with Pt's daughter see telephone encounter of this date

## 2017-10-14 NOTE — Telephone Encounter (Signed)
Spoke with pt's daughter and she states that her Mother has been ordered  Amiloride Hydrochloride 50 mg daily and informed there is no interaction between Amiloride and Coumadin and to take coumadin as ordered and Amiloride as ordered and she states understanding

## 2017-10-14 NOTE — Telephone Encounter (Signed)
Received a voicemail from Walls, pt's dtr, on 10/11/17 614pm stating to call back regarding a questions she has. Returned a call to the dtr & had to leave a message stating to call back regarding her question.

## 2017-10-16 ENCOUNTER — Ambulatory Visit (INDEPENDENT_AMBULATORY_CARE_PROVIDER_SITE_OTHER): Payer: Medicare Other | Admitting: *Deleted

## 2017-10-16 DIAGNOSIS — Z7901 Long term (current) use of anticoagulants: Secondary | ICD-10-CM

## 2017-10-16 DIAGNOSIS — Z8673 Personal history of transient ischemic attack (TIA), and cerebral infarction without residual deficits: Secondary | ICD-10-CM | POA: Diagnosis not present

## 2017-10-16 DIAGNOSIS — Z8679 Personal history of other diseases of the circulatory system: Secondary | ICD-10-CM | POA: Diagnosis not present

## 2017-10-16 DIAGNOSIS — Z9889 Other specified postprocedural states: Secondary | ICD-10-CM

## 2017-10-16 DIAGNOSIS — Z5181 Encounter for therapeutic drug level monitoring: Secondary | ICD-10-CM

## 2017-10-16 DIAGNOSIS — I480 Paroxysmal atrial fibrillation: Secondary | ICD-10-CM

## 2017-10-16 LAB — POCT INR: INR: 2.9

## 2017-10-16 NOTE — Patient Instructions (Signed)
Description   Continue same dose of coumadin 1 tablet daily except 2 tablets on Sundays.   Recheck INR in 2 weeks. Call us with medication changes or concerns, Main # [phone]. Cou[facility]in Clinic # [phone].

## 2017-10-30 ENCOUNTER — Ambulatory Visit (INDEPENDENT_AMBULATORY_CARE_PROVIDER_SITE_OTHER): Payer: Medicare Other | Admitting: *Deleted

## 2017-10-30 DIAGNOSIS — Z8679 Personal history of other diseases of the circulatory system: Secondary | ICD-10-CM

## 2017-10-30 DIAGNOSIS — Z7901 Long term (current) use of anticoagulants: Secondary | ICD-10-CM

## 2017-10-30 DIAGNOSIS — Z5181 Encounter for therapeutic drug level monitoring: Secondary | ICD-10-CM | POA: Diagnosis not present

## 2017-10-30 DIAGNOSIS — I480 Paroxysmal atrial fibrillation: Secondary | ICD-10-CM

## 2017-10-30 DIAGNOSIS — Z8673 Personal history of transient ischemic attack (TIA), and cerebral infarction without residual deficits: Secondary | ICD-10-CM | POA: Diagnosis not present

## 2017-10-30 DIAGNOSIS — Z9889 Other specified postprocedural states: Secondary | ICD-10-CM

## 2017-10-30 LAB — POCT INR: INR: 3.3

## 2017-10-30 NOTE — Patient Instructions (Signed)
Description   Do not take coumadin today April 24th then continue same dose of coumadin 1 tablet daily except 2 tablets on Sundays.   Recheck INR in 2 weeks. Call us with medication changes or concerns, Main # [phone]. Cou[facility]in Clinic # [phone].

## 2017-11-12 ENCOUNTER — Telehealth (HOSPITAL_COMMUNITY): Payer: Self-pay

## 2017-11-12 NOTE — Telephone Encounter (Signed)
Cardiac Rehab Medication Review by a Pharmacist  Spoke with significant other, Jacalyn Lefevre. He was able to give medication information.  Does the patient  feel that his/her medications are working for him/her?  yes  Has the patient been experiencing any side effects to the medications prescribed?  no  Does the patient measure his/her own blood pressure or blood glucose at home?  yes   Does the patient have any problems obtaining medications due to transportation or finances?   no  Understanding of regimen: good Understanding of indications: good Potential of compliance: good   Pharmacist comments: Patient's husband was able to answer all questions. He reports that patient takes her blood pressure at home. He helps wife take her medications and uses pill boxes to keep up with meds.   Lina Sar Kyon Bentler 11/12/2017 3:31 PM

## 2017-11-13 ENCOUNTER — Ambulatory Visit: Payer: Medicare Other | Admitting: *Deleted

## 2017-11-13 DIAGNOSIS — Z7901 Long term (current) use of anticoagulants: Secondary | ICD-10-CM

## 2017-11-13 DIAGNOSIS — Z5181 Encounter for therapeutic drug level monitoring: Secondary | ICD-10-CM

## 2017-11-13 DIAGNOSIS — Z9889 Other specified postprocedural states: Secondary | ICD-10-CM | POA: Diagnosis not present

## 2017-11-13 DIAGNOSIS — I480 Paroxysmal atrial fibrillation: Secondary | ICD-10-CM

## 2017-11-13 DIAGNOSIS — Z8673 Personal history of transient ischemic attack (TIA), and cerebral infarction without residual deficits: Secondary | ICD-10-CM | POA: Diagnosis not present

## 2017-11-13 LAB — POCT INR: INR: 2.4

## 2017-11-13 NOTE — Patient Instructions (Signed)
Description   Continue same dose of coumadin 1 tablet daily except 2 tablets on Sundays.  Recheck INR in 3 weeks. Call us with medication changes or concerns, Main # [phone]. Cou[facility]in Clinic # [phone].      

## 2017-11-14 ENCOUNTER — Encounter (HOSPITAL_COMMUNITY): Payer: Self-pay

## 2017-11-14 ENCOUNTER — Encounter (HOSPITAL_COMMUNITY)
Admission: RE | Admit: 2017-11-14 | Discharge: 2017-11-14 | Disposition: A | Discharge status: Home / Self Care | Payer: Medicare Other | Source: Ambulatory Visit | Attending: Cardiology | Admitting: Cardiology

## 2017-11-14 VITALS — Ht 67.0 in | Wt 135.1 lb

## 2017-11-14 DIAGNOSIS — I129 Hypertensive chronic kidney disease with stage 1 through stage 4 chronic kidney disease, or unspecified chronic kidney disease: Secondary | ICD-10-CM | POA: Diagnosis not present

## 2017-11-14 DIAGNOSIS — K589 Irritable bowel syndrome without diarrhea: Secondary | ICD-10-CM | POA: Insufficient documentation

## 2017-11-14 DIAGNOSIS — Z79899 Other long term (current) drug therapy: Secondary | ICD-10-CM | POA: Diagnosis not present

## 2017-11-14 DIAGNOSIS — Z950 Presence of cardiac pacemaker: Secondary | ICD-10-CM | POA: Insufficient documentation

## 2017-11-14 DIAGNOSIS — E039 Hypothyroidism, unspecified: Secondary | ICD-10-CM | POA: Insufficient documentation

## 2017-11-14 DIAGNOSIS — I341 Nonrheumatic mitral (valve) prolapse: Secondary | ICD-10-CM | POA: Insufficient documentation

## 2017-11-14 DIAGNOSIS — I480 Paroxysmal atrial fibrillation: Secondary | ICD-10-CM | POA: Insufficient documentation

## 2017-11-14 DIAGNOSIS — Z9889 Other specified postprocedural states: Secondary | ICD-10-CM | POA: Diagnosis present

## 2017-11-14 DIAGNOSIS — Z8673 Personal history of transient ischemic attack (TIA), and cerebral infarction without residual deficits: Secondary | ICD-10-CM | POA: Diagnosis not present

## 2017-11-14 DIAGNOSIS — Z79890 Hormone replacement therapy (postmenopausal): Secondary | ICD-10-CM | POA: Insufficient documentation

## 2017-11-14 DIAGNOSIS — N183 Chronic kidney disease, stage 3 (moderate): Secondary | ICD-10-CM | POA: Diagnosis not present

## 2017-11-14 DIAGNOSIS — Z7901 Long term (current) use of anticoagulants: Secondary | ICD-10-CM | POA: Insufficient documentation

## 2017-11-14 DIAGNOSIS — E785 Hyperlipidemia, unspecified: Secondary | ICD-10-CM | POA: Diagnosis not present

## 2017-11-14 DIAGNOSIS — Z8679 Personal history of other diseases of the circulatory system: Secondary | ICD-10-CM | POA: Diagnosis present

## 2017-11-14 NOTE — Progress Notes (Addendum)
Cardiac Individual Treatment Plan  Patient Details  Name: Amanda [name] MRN: [id number] Date of Birth: 07-03-1941 Referring Provider:     CARDIAC REHAB PHASE II ORIENTATION from 11/14/2017 in [facility] CARDIAC REHAB  Referring Provider  Quintella Reichert MD       Initial Encounter Date:    CARDIAC REHAB PHASE II ORIENTATION from 11/14/2017 in [facility] CARDIAC REHAB  Date  11/14/17  Referring Provider  Quintella Reichert MD       Visit Diagnosis: S/P MVR (mitral valve repair)  Patient's Home Medications on Admission:  Current Outpatient Medications:  .  acetaminophen (TYLENOL) 500 MG tablet, Take 500 mg by mouth every 6 (six) hours as needed. , Disp: , Rfl:  .  aMILoride (MIDAMOR) 5 MG tablet, Take 5 mg by mouth daily., Disp: , Rfl:  .  calcitRIOL (ROCALTROL) 0.25 MCG capsule, Take 0.25 mcg by mouth every 3 (three) days. In the morning , Disp: , Rfl:  .  conjugated estrogens (PREMARIN) vaginal cream, Place 1 Applicatorful vaginally at bedtime. , Disp: , Rfl:  .  fluticasone (FLONASE) 50 MCG/ACT nasal spray, Place 2 sprays into both nostrils daily. , Disp: , Rfl:  .  levocetirizine (XYZAL) 5 MG tablet, Take 5 mg by mouth daily as needed for allergies., Disp: , Rfl:  .  levothyroxine (SYNTHROID, LEVOTHROID) 75 MCG tablet, Take 1 tablet (75 mcg total) by mouth See admin instructions. Take 75 mcg by mouth daily around 0300 (Patient taking differently: Take 75 mcg by mouth daily at 2 am. (0300)), Disp: 30 tablet, Rfl: 0 .  metoprolol tartrate (LOPRESSOR) 100 MG tablet, Take 1 tablet (100 mg total) 2 (two) times daily by mouth., Disp: 180 tablet, Rfl: 3 .  vitamin B-12 (CYANOCOBALAMIN) 1000 MCG tablet, Take 1,000 mcg by mouth once a week., Disp: , Rfl:  .  warfarin (COUMADIN) 1 MG tablet, Take 1 tablet (1 mg total) by mouth daily. (Patient taking differently: Take 1 mg by mouth every evening. ), Disp: 30 tablet, Rfl: 11 .  naproxen sodium (ALEVE) 220 MG  tablet, Take 220 mg by mouth daily as needed (for severe back pain.)., Disp: , Rfl:  .  Probiotic Product (PROBIOTIC DAILY PO), Take 1 capsule by mouth 2 (two) times a week. In the morning, Disp: , Rfl:   Past Medical History: Past Medical History:  Diagnosis Date  . Allergic rhinitis   . Anemia   . Asymptomatic LV dysfunction    EF 45-50% echo 07/2017  . CKD (chronic kidney disease), stage III (HCC)    stage III  . DJD (degenerative joint disease)   . Glomerulonephritis    Dr [name]  . Hyperlipidemia   . Hyperparathyroidism (HCC)   . Hypertension   . Hypothyroidism   . IBS (irritable bowel syndrome)   . Interstitial cystitis   . Mitral regurgitation   . Mobitz II    a. s/p STJ dual chamber PPM   . MVP (mitral valve prolapse)    moderate posterior MVP with moderate MR and grade II diasotlic dysfunction  . PAC (premature atrial contraction)   . PAF (paroxysmal atrial fibrillation) (HCC)     note on pacer check. CHADS2VASC score is 4 now on Eliquis.  [name] PVC's (premature ventricular contractions)   . S/P minimally invasive maze operation for atrial fibrillation 04/16/2017   Complete bilateral atrial lesion set using cryothermy and bipolar radiofrequency ablation with clipping of LA appendage via right mini thoracotomy approach  .  S/P minimally invasive mitral valve repair 04/16/2017   Complex valvuloplasty including triangular resection of posterior leaflet, artificial Gore-tex neochords x6 and Sorin Memo 3D ring annuloplasty ([id number], size 32, serial # [id number])  . S/P placement of cardiac pacemaker   . Small vessel disease, cerebrovascular   . Stroke [facility])    a. old stroke seen on imaging.    Tobacco Use: Social History   Tobacco Use  Smoking Status Former Smoker  Smokeless Tobacco Never Used    Labs: Recent Hydrographic surveyor    Labs for ITP Cardiac and Pulmonary Rehab Latest Ref Rng & Units 04/17/2017 04/18/2017 04/19/2017 04/20/2017 04/22/2017   Cholestrol 0 - 200  mg/dL - - - - -   LDLCALC 0 - 99 mg/dL - - - - -   HDL >16 mg/dL - - - - -   Trlycerides <150 mg/dL - - - 109 604(V)   Hemoglobin A1c 4.8 - 5.6 % - - - - -   PHART 7.350 - 7.450 7.334(L) 7.373 7.520(H) - -   PCO2ART 32.0 - 48.0 mmHg 38.1 40.6 37.9 - -   HCO3 20.0 - 28.0 mmol/L 20.4 23.3 30.8(H) - -   TCO2 22 - 32 mmol/L 22 - - - -   ACIDBASEDEF 0.0 - 2.0 mmol/L 5.0(H) 1.3 - - -   O2SAT % 94.0 91.0 94.5 - -      Capillary Blood Glucose: Lab Results  Component Value Date   GLUCAP 106 (H) 04/26/2017   GLUCAP 108 (H) 04/26/2017   GLUCAP 54 (L) 04/26/2017   GLUCAP 87 04/26/2017   GLUCAP 141 (H) 04/25/2017     Exercise Target Goals: Date: 11/14/17  Exercise Program Goal: Individual exercise prescription set using results from initial 6 min walk test and THRR while considering  patient's activity barriers and safety.   Exercise Prescription Goal: Initial exercise prescription builds to 30-45 minutes a day of aerobic activity, 2-3 days per week.  Home exercise guidelines will be given to patient during program as part of exercise prescription that the participant will acknowledge.  Activity Barriers & Risk Stratification: Activity Barriers & Cardiac Risk Stratification - 11/14/17 1106      Activity Barriers & Cardiac Risk Stratification   Activity Barriers  Muscular Weakness;Deconditioning;Other (comment);Balance Concerns    Comments  neck stiffness and B hip pain    Cardiac Risk Stratification  High       6 Minute Walk: 6 Minute Walk    Row Name 11/14/17 1105         6 Minute Walk   Phase  Initial     Distance  1200 feet     Walk Time  6 minutes     # of Rest Breaks  0     MPH  2.27     METS  2.49     RPE  11     Perceived Dyspnea   0     VO2 Peak  8.71     Symptoms  No     Resting HR  62 bpm     Resting BP  124/75     Resting Oxygen Saturation   99 %     Exercise Oxygen Saturation  during 6 min walk  100 %     Max Ex. HR  81 bpm     Max Ex. BP  134/75      2 Minute Post BP  123/73        Oxygen Initial Assessment:  Oxygen Re-Evaluation:   Oxygen Discharge (Final Oxygen Re-Evaluation):   Initial Exercise Prescription: Initial Exercise Prescription - 11/14/17 1100      Date of Initial Exercise RX and Referring Provider   Date  11/14/17    Referring Provider  Quintella Reichert MD       Recumbant Bike   Level  1.5    Watts  10    Minutes  10    METs  2.52      NuStep   Level  2    SPM  70    Minutes  10    METs  2      Track   Laps  9    Minutes  10    METs  2.53      Prescription Details   Frequency (times per week)  3x    Duration  Progress to 30 minutes of continuous aerobic without signs/symptoms of physical distress      Intensity   THRR 40-80% of Max Heartrate  57-114    Ratings of Perceived Exertion  11-13    Perceived Dyspnea  0-4      Progression   Progression  Continue progressive overload as per policy without signs/symptoms or physical distress.      Resistance Training   Training Prescription  Yes    Weight  2lbs    Reps  10-15       Perform Capillary Blood Glucose checks as needed.  Exercise Prescription Changes:   Exercise Comments:   Exercise Goals and Review: Exercise Goals    Row Name 11/14/17 0948             Exercise Goals   Increase Physical Activity  Yes       Intervention  Provide advice, education, support and counseling about physical activity/exercise needs.;Develop an individualized exercise prescription for aerobic and resistive training based on initial evaluation findings, risk stratification, comorbidities and participant's personal goals.       Expected Outcomes  Short Term: Attend rehab on a regular basis to increase amount of physical activity.;Long Term: Add in home exercise to make exercise part of routine and to increase amount of physical activity.;Long Term: Exercising regularly at least 3-5 days a week.       Increase Strength and Stamina  Yes return to  gardening and yardwork       Intervention  Provide advice, education, support and counseling about physical activity/exercise needs.;Develop an individualized exercise prescription for aerobic and resistive training based on initial evaluation findings, risk stratification, comorbidities and participant's personal goals.       Expected Outcomes  Short Term: Increase workloads from initial exercise prescription for resistance, speed, and METs.;Short Term: Perform resistance training exercises routinely during rehab and add in resistance training at home;Long Term: Improve cardiorespiratory fitness, muscular endurance and strength as measured by increased METs and functional capacity ( )       Able to understand and use rate of perceived exertion (RPE) scale  Yes       Intervention  Provide education and explanation on how to use RPE scale       Expected Outcomes  Short Term: Able to use RPE daily in rehab to express subjective intensity level;Long Term:  Able to use RPE to guide intensity level when exercising independently       Intervention  Provide education and explanation on how to use Dyspnea scale       Expected Outcomes  Short Term: Able  to use Dyspnea scale daily in rehab to express subjective sense of shortness of breath during exertion;Long Term: Able to use Dyspnea scale to guide intensity level when exercising independently       Knowledge and understanding of Target Heart Rate Range (THRR)  Yes       Intervention  Provide education and explanation of THRR including how the numbers were predicted and where they are located for reference       Expected Outcomes  Short Term: Able to state/look up THRR;Long Term: Able to use THRR to govern intensity when exercising independently;Short Term: Able to use daily as guideline for intensity in rehab       Able to check pulse independently  Yes       Intervention  Provide education and demonstration on how to check pulse in carotid and radial  arteries.;Review the importance of being able to check your own pulse for safety during independent exercise       Expected Outcomes  Short Term: Able to explain why pulse checking is important during independent exercise;Long Term: Able to check pulse independently and accurately       Understanding of Exercise Prescription  Yes       Intervention  Provide education, explanation, and written materials on patient's individual exercise prescription       Expected Outcomes  Short Term: Able to explain program exercise prescription;Long Term: Able to explain home exercise prescription to exercise independently          Exercise Goals Re-Evaluation :    Discharge Exercise Prescription (Final Exercise Prescription Changes):   Nutrition:  Target Goals: Understanding of nutrition guidelines, daily intake of sodium 1500mg , cholesterol 200mg , calories 30% from fat and 7% or less from saturated fats, daily to have 5 or more servings of fruits and vegetables.  Biometrics: Pre Biometrics - 11/14/17 1136      Pre Biometrics   Waist Circumference  32 inches    Hip Circumference  39 inches    Waist to Hip Ratio  0.82 %    Triceps Skinfold  20 mm    % Body Fat  33.3 %    Grip Strength  20 kg    Flexibility  0 in    Single Leg Stand  4.3 seconds        Nutrition Therapy Plan and Nutrition Goals:   Nutrition Assessments:   Nutrition Goals Re-Evaluation:   Nutrition Goals Re-Evaluation:   Nutrition Goals Discharge (Final Nutrition Goals Re-Evaluation):   Psychosocial: Target Goals: Acknowledge presence or absence of significant depression and/or stress, maximize coping skills, provide positive support system. Participant is able to verbalize types and ability to use techniques and skills needed for reducing stress and depression.  Initial Review & Psychosocial Screening: Initial Psych Review & Screening - 11/14/17 1144      Initial Review   Current issues with  None Identified       Family Dynamics   Good Support System?  Yes Pt's daughter was present at orientation.  Pt also reports that her husband is source of support.      Barriers   Psychosocial barriers to participate in program  There are no identifiable barriers or psychosocial needs.      Screening Interventions   Interventions  Encouraged to exercise       Quality of Life Scores: Quality of Life - 11/14/17 1115      Quality of Life Scores   Health/Function Pre  25.29 %  Socioeconomic Pre  30 %    Psych/Spiritual Pre  29.14 %    Family Pre  28.8 %    GLOBAL Pre  27.56 %      Scores of 19 and below usually indicate a poorer quality of life in these areas.  A difference of  2-3 points is a clinically meaningful difference.  A difference of 2-3 points in the total score of the Quality of Life Index has been associated with significant improvement in overall quality of life, self-image, physical symptoms, and general health in studies assessing change in quality of life.  PHQ-9: Recent Review Flowsheet Data    Depression screen [facility] 2/9 06/03/2017   Decreased Interest 0   Down, Depressed, Hopeless 0   PHQ - 2 Score 0   Altered sleeping 0   Tired, decreased energy 0   Change in appetite 0   Feeling bad or failure about yourself  0   Trouble concentrating 0   Moving slowly or fidgety/restless 0   Suicidal thoughts 0   PHQ-9 Score 0   Difficult doing work/chores Not difficult at all     Interpretation of Total Score  Total Score Depression Severity:  1-4 = Minimal depression, 5-9 = Mild depression, 10-14 = Moderate depression, 15-19 = Moderately severe depression, 20-27 = Severe depression   Psychosocial Evaluation and Intervention:   Psychosocial Re-Evaluation:   Psychosocial Discharge (Final Psychosocial Re-Evaluation):   Vocational Rehabilitation: Provide vocational rehab assistance to qualifying candidates.   Vocational Rehab Evaluation & Intervention: Vocational Rehab -  11/14/17 1140      Initial Vocational Rehab Evaluation & Intervention   Assessment shows need for Vocational Rehabilitation  No       Education: Education Goals: Education classes will be provided on a weekly basis, covering required topics. Participant will state understanding/return demonstration of topics presented.  Learning Barriers/Preferences: Learning Barriers/Preferences - 11/14/17 0947      Learning Barriers/Preferences   Learning Barriers  Sight cataracts/readers    Learning Preferences  Video;Verbal Instruction;Skilled Demonstration;Pictoral;Written Material       Education Topics: Count Your Pulse:  -Group instruction provided by verbal instruction, demonstration, patient participation and written materials to support subject.  Instructors address importance of being able to find your pulse and how to count your pulse when at home without a heart monitor.  Patients get hands on experience counting their pulse with staff help and individually.   Heart Attack, Angina, and Risk Factor Modification:  -Group instruction provided by verbal instruction, video, and written materials to support subject.  Instructors address signs and symptoms of angina and heart attacks.    Also discuss risk factors for heart disease and how to make changes to improve heart health risk factors.   Functional Fitness:  -Group instruction provided by verbal instruction, demonstration, patient participation, and written materials to support subject.  Instructors address safety measures for doing things around the house.  Discuss how to get up and down off the floor, how to pick things up properly, how to safely get out of a chair without assistance, and balance training.   Meditation and Mindfulness:  -Group instruction provided by verbal instruction, patient participation, and written materials to support subject.  Instructor addresses importance of mindfulness and meditation practice to help reduce  stress and improve awareness.  Instructor also leads participants through a meditation exercise.    Stretching for Flexibility and Mobility:  -Group instruction provided by verbal instruction, patient participation, and written materials to support subject.  Instructors lead participants through series of stretches that are designed to increase flexibility thus improving mobility.  These stretches are additional exercise for major muscle groups that are typically performed during regular warm up and cool down.   Hands Only CPR:  -Group verbal, video, and participation provides a basic overview of AHA guidelines for community CPR. Role-play of emergencies allow participants the opportunity to practice calling for help and chest compression technique with discussion of AED use.   Hypertension: -Group verbal and written instruction that provides a basic overview of hypertension including the most recent diagnostic guidelines, risk factor reduction with self-care instructions and medication management.    Nutrition I class: Heart Healthy Eating:  -Group instruction provided by PowerPoint slides, verbal discussion, and written materials to support subject matter. The instructor gives an explanation and review of the Therapeutic Lifestyle Changes diet recommendations, which includes a discussion on lipid goals, dietary fat, sodium, fiber, plant stanol/sterol esters, sugar, and the components of a well-balanced, healthy diet.   Nutrition II class: Lifestyle Skills:  -Group instruction provided by PowerPoint slides, verbal discussion, and written materials to support subject matter. The instructor gives an explanation and review of label reading, grocery shopping for heart health, heart healthy recipe modifications, and ways to make healthier choices when eating out.   Diabetes Question & Answer:  -Group instruction provided by PowerPoint slides, verbal discussion, and written materials to support  subject matter. The instructor gives an explanation and review of diabetes co-morbidities, pre- and post-prandial blood glucose goals, pre-exercise blood glucose goals, signs, symptoms, and treatment of hypoglycemia and hyperglycemia, and foot care basics.   Diabetes Blitz:  -Group instruction provided by PowerPoint slides, verbal discussion, and written materials to support subject matter. The instructor gives an explanation and review of the physiology behind type 1 and type 2 diabetes, diabetes medications and rational behind using different medications, pre- and post-prandial blood glucose recommendations and Hemoglobin A1c goals, diabetes diet, and exercise including blood glucose guidelines for exercising safely.    Portion Distortion:  -Group instruction provided by PowerPoint slides, verbal discussion, written materials, and food models to support subject matter. The instructor gives an explanation of serving size versus portion size, changes in portions sizes over the last 20 years, and what consists of a serving from each food group.   Stress Management:  -Group instruction provided by verbal instruction, video, and written materials to support subject matter.  Instructors review role of stress in heart disease and how to cope with stress positively.     Exercising on Your Own:  -Group instruction provided by verbal instruction, power point, and written materials to support subject.  Instructors discuss benefits of exercise, components of exercise, frequency and intensity of exercise, and end points for exercise.  Also discuss use of nitroglycerin and activating EMS.  Review options of places to exercise outside of rehab.  Review guidelines for sex with heart disease.   Cardiac Drugs I:  -Group instruction provided by verbal instruction and written materials to support subject.  Instructor reviews cardiac drug classes: antiplatelets, anticoagulants, beta blockers, and statins.   Instructor discusses reasons, side effects, and lifestyle considerations for each drug class.   Cardiac Drugs II:  -Group instruction provided by verbal instruction and written materials to support subject.  Instructor reviews cardiac drug classes: angiotensin converting enzyme inhibitors (ACE-I), angiotensin II receptor blockers (ARBs), nitrates, and calcium channel blockers.  Instructor discusses reasons, side effects, and lifestyle considerations for each drug class.   Anatomy and  Physiology of the Circulatory System:  Group verbal and written instruction and models provide basic cardiac anatomy and physiology, with the coronary electrical and arterial systems. Review of: AMI, Angina, Valve disease, Heart Failure, Peripheral Artery Disease, Cardiac Arrhythmia, Pacemakers, and the ICD.   Other Education:  -Group or individual verbal, written, or video instructions that support the educational goals of the cardiac rehab program.   Holiday Eating Survival Tips:  -Group instruction provided by PowerPoint slides, verbal discussion, and written materials to support subject matter. The instructor gives patients tips, tricks, and techniques to help them not only survive but enjoy the holidays despite the onslaught of food that accompanies the holidays.   Knowledge Questionnaire Score: Knowledge Questionnaire Score - 11/14/17 1115      Knowledge Questionnaire Score   Pre Score  21/24       Core Components/Risk Factors/Patient Goals at Admission: Personal Goals and Risk Factors at Admission - 11/14/17 1116      Core Components/Risk Factors/Patient Goals on Admission    Weight Management  Yes       Core Components/Risk Factors/Patient Goals Review:    Core Components/Risk Factors/Patient Goals at Discharge (Final Review):    ITP Comments: ITP Comments    Row Name 11/14/17 0943           ITP Comments  Dr. Armanda Magic, Medical Director          Comments: Patient attended  orientation from [phone] to 1041 to review rules and guidelines for program. Completed 6 minute walk test, Intitial ITP, and exercise prescription.  VSS. Telemetry-AV Paced.  Asymptomatic.

## 2017-11-14 NOTE — Progress Notes (Signed)
Amanda [name] 77 y.o. female DOB: [birth year] MRN: [id number]      Nutrition Note  1. S/P MVR (mitral valve repair)    Past Medical History:  Diagnosis Date  . Allergic rhinitis   . Anemia   . Asymptomatic LV dysfunction    EF 45-50% echo 07/2017  . CKD (chronic kidney disease), stage III (HCC)    stage III  . DJD (degenerative joint disease)   . Glomerulonephritis    Dr [name]  . Hyperlipidemia   . Hyperparathyroidism (HCC)   . Hypertension   . Hypothyroidism   . IBS (irritable bowel syndrome)   . Interstitial cystitis   . Mitral regurgitation   . Mobitz II    a. s/p STJ dual chamber PPM   . MVP (mitral valve prolapse)    moderate posterior MVP with moderate MR and grade II diasotlic dysfunction  . PAC (premature atrial contraction)   . PAF (paroxysmal atrial fibrillation) (HCC)     note on pacer check. CHADS2VASC score is 4 now on Eliquis.  [name] PVC's (premature ventricular contractions)   . S/P minimally invasive maze operation for atrial fibrillation 04/16/2017   Complete bilateral atrial lesion set using cryothermy and bipolar radiofrequency ablation with clipping of LA appendage via right mini thoracotomy approach  . S/P minimally invasive mitral valve repair 04/16/2017   Complex valvuloplasty including triangular resection of posterior leaflet, artificial Gore-tex neochords x6 and Sorin Memo 3D ring annuloplasty ([id number], size 32, serial # [id number])  . S/P placement of cardiac pacemaker   . Small vessel disease, cerebrovascular   . Stroke [facility])    a. old stroke seen on imaging.   Meds reviewed.   HT: Ht Readings from Last 1 Encounters:  11/14/17 5\' 7"  (1.702 m)    WT: Wt Readings from Last 10 Encounters:  11/14/17 135 lb 2.3 oz (61.3 kg)  09/30/17 135 lb (61.2 kg)  09/23/17 135 lb (61.2 kg)  08/19/17 134 lb (60.8 kg)  08/05/17 134 lb (60.8 kg)  06/24/17 133 lb (60.3 kg)  06/10/17 134 lb 12.8 oz (61.1 kg)  05/27/17 142 lb 1.9 oz (64.5 kg)  05/24/17 136 lb  1.6 oz (61.7 kg)  05/17/17 140 lb (63.5 kg)      Body mass index is 21.17 kg/m.   Current tobacco use? No   Labs:  Lipid Panel     Component Value Date/Time   CHOL 153 01/07/2015 0304   TRIG 197 (H) 04/22/2017 0419   HDL 51 01/07/2015 0304   CHOLHDL 3.0 01/07/2015 0304   VLDL 13 01/07/2015 0304   LDLCALC 89 01/07/2015 0304    Lab Results  Component Value Date   HGBA1C 5.8 (H) 04/12/2017   CBG (last 3)  No results for input(s): GLUCAP in the last 72 hours.  Nutrition Note Spoke with pt and pt's daughter, Amanda [name]. Nutrition plan and goals reviewed with pt. Pt is following a heart healthy diet. Age-appropriate nutrition recommendations discussed. Pt c/o decreased desire to eat and food tastes different since her surgery. Pt wt is down ~5-9 lb from her UBW of 140-144 lb. Pt encouraged to liberalize her diet and eat what she can or drink nutritional supplements. Pt has been drinking nutrition supplements when she does not want to eat. Pt reports she has not spoken with the pharmacist. Per discussion with Lynnda Child, RN, pt's husband spoke with the pharmacist. RN informed pt is interested in meeting with the pharmacist. Pt is on Coumadin and is aware of  the need to follow a diet with consistent vitamin K intake. Pt's daughter reports the plan for now is for pt to eat 2 salads/week. Pt expressed understanding of the information reviewed. Pt aware of nutrition education classes offered.  Nutrition Diagnosis ? Food-and nutrition-related knowledge deficit related to lack of exposure to information as related to diagnosis of: ? CVD ? Pre-DM  Nutrition Intervention ? Pt's individual nutrition plan and goals reviewed with pt. ? Pt given handouts for: ? Nutrition I class ? Nutrition II class ? Consistent vit K diet   Nutrition Goal(s):  ? Pt to liberalize diet to promote energy intake until appetite and loss of taste improve  Plan:  Pt to attend nutrition classes ? Nutrition I ? Nutrition  II ? Portion Distortion  Will provide client-centered nutrition education as part of interdisciplinary care.   Monitor and evaluate progress toward nutrition goal with team.  Amanda [name], M.Ed, RD, LDN, CDE 11/14/2017 11:58 AM

## 2017-11-18 ENCOUNTER — Encounter (HOSPITAL_COMMUNITY)
Admission: RE | Admit: 2017-11-18 | Discharge: 2017-11-18 | Disposition: A | Discharge status: Home / Self Care | Payer: Medicare Other | Source: Ambulatory Visit | Attending: Cardiology | Admitting: Cardiology

## 2017-11-18 ENCOUNTER — Ambulatory Visit (INDEPENDENT_AMBULATORY_CARE_PROVIDER_SITE_OTHER): Payer: Medicare Other | Admitting: *Deleted

## 2017-11-18 ENCOUNTER — Encounter (HOSPITAL_COMMUNITY): Payer: Medicare Other

## 2017-11-18 DIAGNOSIS — I341 Nonrheumatic mitral (valve) prolapse: Secondary | ICD-10-CM | POA: Diagnosis not present

## 2017-11-18 DIAGNOSIS — Z9889 Other specified postprocedural states: Secondary | ICD-10-CM

## 2017-11-18 DIAGNOSIS — R0602 Shortness of breath: Secondary | ICD-10-CM | POA: Diagnosis not present

## 2017-11-18 DIAGNOSIS — Z8673 Personal history of transient ischemic attack (TIA), and cerebral infarction without residual deficits: Secondary | ICD-10-CM

## 2017-11-18 NOTE — Progress Notes (Signed)
Daily Session Note  Patient Details  Name: Amanda [name] MRN: [id number] Date of Birth: [birth year] Referring Provider:     CARDIAC REHAB PHASE II ORIENTATION from 11/14/2017 in [place]  Referring Provider  [name]       Encounter Date: 11/18/2017  Check In: Session Check In - 11/18/17 1208      Check-In   Location  [facility]    Staff Present  Andi Hence, RN, Mosie Epstein, MS,ACSM CEP, Exercise Physiologist;Makar Slatter, RN, BSN    Supervising physician immediately available to respond to emergencies  [facility] immediately available    Physician(s)  Dr. Loleta Books    Medication changes reported      No    Fall or balance concerns reported     No    Tobacco Cessation  No Change    Warm-up and Cool-down  Performed as group-led instruction    Resistance Training Performed  Yes    VAD Patient?  No      Pain Assessment   Currently in Pain?  No/denies       Capillary Blood Glucose: No results found for this or any previous visit (from the past 24 hour(s)).    Social History   Tobacco Use  Smoking Status Former Smoker  Smokeless Tobacco Never Used    Goals Met:  Exercise tolerated well  Goals Unmet:  Not Applicable  Comments: Pt started cardiac rehab today.  Pt tolerated light exercise without difficulty. VSS, telemetry-AV paced, asymptomatic.  Medication list reconciled. Pt denies barriers to medicaiton compliance.  PSYCHOSOCIAL ASSESSMENT:  PHQ-0. Pt exhibits positive coping skills, hopeful outlook with supportive family. No psychosocial needs identified at this time, no psychosocial interventions necessary.    Pt enjoys gardening and reading.   Pt oriented to exercise equipment and routine.    Understanding verbalized.[name], RN,BSN 11/18/2017 12:25 PM   Dr. Fransico Him is Medical Director for Cardiac Rehab at [facility].

## 2017-11-18 NOTE — Progress Notes (Signed)
Remote pacemaker transmission.   

## 2017-11-20 ENCOUNTER — Encounter (HOSPITAL_COMMUNITY)
Admission: RE | Admit: 2017-11-20 | Discharge: 2017-11-20 | Disposition: A | Discharge status: Home / Self Care | Payer: Medicare Other | Source: Ambulatory Visit | Attending: Cardiology | Admitting: Cardiology

## 2017-11-20 ENCOUNTER — Encounter: Payer: Self-pay | Admitting: Cardiology

## 2017-11-20 ENCOUNTER — Encounter (HOSPITAL_COMMUNITY): Payer: Medicare Other

## 2017-11-20 DIAGNOSIS — I341 Nonrheumatic mitral (valve) prolapse: Secondary | ICD-10-CM | POA: Diagnosis not present

## 2017-11-20 DIAGNOSIS — Z9889 Other specified postprocedural states: Secondary | ICD-10-CM

## 2017-11-20 LAB — CUP PACEART REMOTE DEVICE CHECK
Battery Remaining Longevity: 117 mo
Battery Remaining Percentage: 95.5 %
Battery Voltage: 3.01 V
Brady Statistic AP VS Percent: 1 %
Brady Statistic AS VS Percent: 1 %
Brady Statistic RV Percent Paced: 96 %
Date Time Interrogation Session: 20190513060014
Implantable Lead Implant Date: 20160701
Implantable Lead Location: 753860
Implantable Pulse Generator Implant Date: 20160701
Lead Channel Impedance Value: 400 Ohm
Lead Channel Pacing Threshold Amplitude: 0.75 V
Lead Channel Pacing Threshold Amplitude: 0.875 V
Lead Channel Pacing Threshold Pulse Width: 0.4 ms
Lead Channel Sensing Intrinsic Amplitude: 4.3 mV
Lead Channel Setting Pacing Amplitude: 1.125
Lead Channel Setting Pacing Pulse Width: 0.4 ms
MDC IDC LEAD IMPLANT DT: 20160701
MDC IDC LEAD LOCATION: 753859
MDC IDC MSMT LEADCHNL RA PACING THRESHOLD PULSEWIDTH: 0.4 ms
MDC IDC MSMT LEADCHNL RV IMPEDANCE VALUE: 580 Ohm
MDC IDC MSMT LEADCHNL RV SENSING INTR AMPL: 12 mV
MDC IDC SET LEADCHNL RA PACING AMPLITUDE: 2 V
MDC IDC SET LEADCHNL RV SENSING SENSITIVITY: 2 mV
MDC IDC STAT BRADY AP VP PERCENT: 23 %
MDC IDC STAT BRADY AS VP PERCENT: 72 %
MDC IDC STAT BRADY RA PERCENT PACED: 21 %
Pulse Gen Model: 2240
Pulse Gen Serial Number: 7785935

## 2017-11-21 NOTE — Progress Notes (Signed)
Cardiac Individual Treatment Plan  Patient Details  Name: ALYSE KATHAN MRN: [id number] Date of Birth: 09-23-1940 Referring Provider:     CARDIAC REHAB PHASE II ORIENTATION from 11/14/2017 in [facility] CARDIAC REHAB  Referring Provider  Quintella Reichert MD       Initial Encounter Date:    CARDIAC REHAB PHASE II ORIENTATION from 11/14/2017 in [facility] CARDIAC REHAB  Date  11/14/17  Referring Provider  Quintella Reichert MD       Visit Diagnosis: S/P MVR (mitral valve repair)  Patient's Home Medications on Admission:  Current Outpatient Medications:  .  acetaminophen (TYLENOL) 500 MG tablet, Take 500 mg by mouth every 6 (six) hours as needed. , Disp: , Rfl:  .  aMILoride (MIDAMOR) 5 MG tablet, Take 5 mg by mouth daily., Disp: , Rfl:  .  calcitRIOL (ROCALTROL) 0.25 MCG capsule, Take 0.25 mcg by mouth every 3 (three) days. In the morning , Disp: , Rfl:  .  conjugated estrogens (PREMARIN) vaginal cream, Place 1 Applicatorful vaginally at bedtime. , Disp: , Rfl:  .  fexofenadine (ALLEGRA) 180 MG tablet, Take 180 mg by mouth daily., Disp: , Rfl:  .  fluticasone (FLONASE) 50 MCG/ACT nasal spray, Place 2 sprays into both nostrils daily. , Disp: , Rfl:  .  levothyroxine (SYNTHROID, LEVOTHROID) 75 MCG tablet, Take 1 tablet (75 mcg total) by mouth See admin instructions. Take 75 mcg by mouth daily around 0300 (Patient taking differently: Take 75 mcg by mouth daily at 2 am. (0300)), Disp: 30 tablet, Rfl: 0 .  metoprolol tartrate (LOPRESSOR) 100 MG tablet, Take 1 tablet (100 mg total) 2 (two) times daily by mouth., Disp: 180 tablet, Rfl: 3 .  naproxen sodium (ALEVE) 220 MG tablet, Take 220 mg by mouth daily as needed (for severe back pain.)., Disp: , Rfl:  .  Probiotic Product (PROBIOTIC DAILY PO), Take 1 capsule by mouth 2 (two) times a week. In the morning, Disp: , Rfl:  .  vitamin B-12 (CYANOCOBALAMIN) 1000 MCG tablet, Take 1,000 mcg by mouth once a week.,  Disp: , Rfl:  .  warfarin (COUMADIN) 1 MG tablet, Take 1 tablet (1 mg total) by mouth daily. (Patient taking differently: Take 1 mg by mouth every evening. ), Disp: 30 tablet, Rfl: 11  Past Medical History: Past Medical History:  Diagnosis Date  . Allergic rhinitis   . Anemia   . Asymptomatic LV dysfunction    EF 45-50% echo 07/2017  . CKD (chronic kidney disease), stage III (HCC)    stage III  . DJD (degenerative joint disease)   . Glomerulonephritis    Dr [name]  . Hyperlipidemia   . Hyperparathyroidism (HCC)   . Hypertension   . Hypothyroidism   . IBS (irritable bowel syndrome)   . Interstitial cystitis   . Mitral regurgitation   . Mobitz II    a. s/p STJ dual chamber PPM   . MVP (mitral valve prolapse)    moderate posterior MVP with moderate MR and grade II diasotlic dysfunction  . PAC (premature atrial contraction)   . PAF (paroxysmal atrial fibrillation) (HCC)     note on pacer check. CHADS2VASC score is 4 now on Eliquis.  [name] PVC's (premature ventricular contractions)   . S/P minimally invasive maze operation for atrial fibrillation 04/16/2017   Complete bilateral atrial lesion set using cryothermy and bipolar radiofrequency ablation with clipping of LA appendage via right mini thoracotomy approach  . S/P minimally  invasive mitral valve repair 04/16/2017   Complex valvuloplasty including triangular resection of posterior leaflet, artificial Gore-tex neochords x6 and Sorin Memo 3D ring annuloplasty ([id number], size 32, serial # [id number])  . S/P placement of cardiac pacemaker   . Small vessel disease, cerebrovascular   . Stroke [facility])    a. old stroke seen on imaging.    Tobacco Use: Social History   Tobacco Use  Smoking Status Former Smoker  Smokeless Tobacco Never Used    Labs: Recent Hydrographic surveyor    Labs for ITP Cardiac and Pulmonary Rehab Latest Ref Rng & Units 04/17/2017 04/18/2017 04/19/2017 04/20/2017 04/22/2017   Cholestrol 0 - 200 mg/dL - - - - -    LDLCALC 0 - 99 mg/dL - - - - -   HDL >40 mg/dL - - - - -   Trlycerides <150 mg/dL - - - 981 191(Y)   Hemoglobin A1c 4.8 - 5.6 % - - - - -   PHART 7.350 - 7.450 7.334(L) 7.373 7.520(H) - -   PCO2ART 32.0 - 48.0 mmHg 38.1 40.6 37.9 - -   HCO3 20.0 - 28.0 mmol/L 20.4 23.3 30.8(H) - -   TCO2 22 - 32 mmol/L 22 - - - -   ACIDBASEDEF 0.0 - 2.0 mmol/L 5.0(H) 1.3 - - -   O2SAT % 94.0 91.0 94.5 - -      Capillary Blood Glucose: Lab Results  Component Value Date   GLUCAP 106 (H) 04/26/2017   GLUCAP 108 (H) 04/26/2017   GLUCAP 54 (L) 04/26/2017   GLUCAP 87 04/26/2017   GLUCAP 141 (H) 04/25/2017     Exercise Target Goals:    Exercise Program Goal: Individual exercise prescription set using results from initial 6 min walk test and THRR while considering  patient's activity barriers and safety.   Exercise Prescription Goal: Initial exercise prescription builds to 30-45 minutes a day of aerobic activity, 2-3 days per week.  Home exercise guidelines will be given to patient during program as part of exercise prescription that the participant will acknowledge.  Activity Barriers & Risk Stratification: Activity Barriers & Cardiac Risk Stratification - 11/14/17 1106      Activity Barriers & Cardiac Risk Stratification   Activity Barriers  Muscular Weakness;Deconditioning;Other (comment);Balance Concerns    Comments  neck stiffness and B hip pain    Cardiac Risk Stratification  High       6 Minute Walk: 6 Minute Walk    Row Name 11/14/17 1105         6 Minute Walk   Phase  Initial     Distance  1200 feet     Walk Time  6 minutes     # of Rest Breaks  0     MPH  2.27     METS  2.49     RPE  11     Perceived Dyspnea   0     VO2 Peak  8.71     Symptoms  No     Resting HR  62 bpm     Resting BP  124/75     Resting Oxygen Saturation   99 %     Exercise Oxygen Saturation  during 6 min walk  100 %     Max Ex. HR  81 bpm     Max Ex. BP  134/75     2 Minute Post BP  123/73         Oxygen Initial Assessment:  Oxygen Re-Evaluation:   Oxygen Discharge (Final Oxygen Re-Evaluation):   Initial Exercise Prescription: Initial Exercise Prescription - 11/14/17 1100      Date of Initial Exercise RX and Referring Provider   Date  11/14/17    Referring Provider  Quintella Reichert MD       Recumbant Bike   Level  1.5    Watts  10    Minutes  10    METs  2.52      NuStep   Level  2    SPM  70    Minutes  10    METs  2      Track   Laps  9    Minutes  10    METs  2.53      Prescription Details   Frequency (times per week)  3x    Duration  Progress to 30 minutes of continuous aerobic without signs/symptoms of physical distress      Intensity   THRR 40-80% of Max Heartrate  57-114    Ratings of Perceived Exertion  11-13    Perceived Dyspnea  0-4      Progression   Progression  Continue progressive overload as per policy without signs/symptoms or physical distress.      Resistance Training   Training Prescription  Yes    Weight  2lbs    Reps  10-15       Perform Capillary Blood Glucose checks as needed.  Exercise Prescription Changes: Exercise Prescription Changes    Row Name 11/18/17 1150             Response to Exercise   Blood Pressure (Admit)  118/70       Blood Pressure (Exercise)  124/68       Blood Pressure (Exit)  104/60       Heart Rate (Admit)  70 bpm       Heart Rate (Exercise)  86 bpm       Heart Rate (Exit)  74 bpm       Rating of Perceived Exertion (Exercise)  12       Perceived Dyspnea (Exercise)  0       Symptoms  None       Comments  Pt oriented to exercise equipment       Duration  Progress to 30 minutes of  aerobic without signs/symptoms of physical distress       Intensity  THRR New         Progression   Progression  Continue to progress workloads to maintain intensity without signs/symptoms of physical distress.       Average METs  2.29         Resistance Training   Training Prescription  Yes       Weight   3lbs        Reps  10-15       Time  10 Minutes         Interval Training   Interval Training  No         Recumbant Bike   Level  1.5       Watts  10       Minutes  10       METs  2         NuStep   Level  2       SPM  80       Minutes  10  METs  2         Track   Laps  9       Minutes  10       METs  2.5          Exercise Comments: Exercise Comments    Row Name 11/21/17 1155           Exercise Comments  Pt is off to a good start with exercise. Pt responded well to first two exercise sessions. Will continue to monitor and progress pt as tolerated.           Exercise Goals and Review: Exercise Goals    Row Name 11/14/17 0948             Exercise Goals   Increase Physical Activity  Yes       Intervention  Provide advice, education, support and counseling about physical activity/exercise needs.;Develop an individualized exercise prescription for aerobic and resistive training based on initial evaluation findings, risk stratification, comorbidities and participant's personal goals.       Expected Outcomes  Short Term: Attend rehab on a regular basis to increase amount of physical activity.;Long Term: Add in home exercise to make exercise part of routine and to increase amount of physical activity.;Long Term: Exercising regularly at least 3-5 days a week.       Increase Strength and Stamina  Yes return to gardening and yardwork       Intervention  Provide advice, education, support and counseling about physical activity/exercise needs.;Develop an individualized exercise prescription for aerobic and resistive training based on initial evaluation findings, risk stratification, comorbidities and participant's personal goals.       Expected Outcomes  Short Term: Increase workloads from initial exercise prescription for resistance, speed, and METs.;Short Term: Perform resistance training exercises routinely during rehab and add in resistance training at home;Long Term:  Improve cardiorespiratory fitness, muscular endurance and strength as measured by increased METs and functional capacity ( )       Able to understand and use rate of perceived exertion (RPE) scale  Yes       Intervention  Provide education and explanation on how to use RPE scale       Expected Outcomes  Short Term: Able to use RPE daily in rehab to express subjective intensity level;Long Term:  Able to use RPE to guide intensity level when exercising independently       Intervention  Provide education and explanation on how to use Dyspnea scale       Expected Outcomes  Short Term: Able to use Dyspnea scale daily in rehab to express subjective sense of shortness of breath during exertion;Long Term: Able to use Dyspnea scale to guide intensity level when exercising independently       Knowledge and understanding of Target Heart Rate Range (THRR)  Yes       Intervention  Provide education and explanation of THRR including how the numbers were predicted and where they are located for reference       Expected Outcomes  Short Term: Able to state/look up THRR;Long Term: Able to use THRR to govern intensity when exercising independently;Short Term: Able to use daily as guideline for intensity in rehab       Able to check pulse independently  Yes       Intervention  Provide education and demonstration on how to check pulse in carotid and radial arteries.;Review the importance of being able to check your own pulse for safety  during independent exercise       Expected Outcomes  Short Term: Able to explain why pulse checking is important during independent exercise;Long Term: Able to check pulse independently and accurately       Understanding of Exercise Prescription  Yes       Intervention  Provide education, explanation, and written materials on patient's individual exercise prescription       Expected Outcomes  Short Term: Able to explain program exercise prescription;Long Term: Able to explain home  exercise prescription to exercise independently          Exercise Goals Re-Evaluation :    Discharge Exercise Prescription (Final Exercise Prescription Changes): Exercise Prescription Changes - 11/18/17 1150      Response to Exercise   Blood Pressure (Admit)  118/70    Blood Pressure (Exercise)  124/68    Blood Pressure (Exit)  104/60    Heart Rate (Admit)  70 bpm    Heart Rate (Exercise)  86 bpm    Heart Rate (Exit)  74 bpm    Rating of Perceived Exertion (Exercise)  12    Perceived Dyspnea (Exercise)  0    Symptoms  None    Comments  Pt oriented to exercise equipment    Duration  Progress to 30 minutes of  aerobic without signs/symptoms of physical distress    Intensity  THRR New      Progression   Progression  Continue to progress workloads to maintain intensity without signs/symptoms of physical distress.    Average METs  2.29      Resistance Training   Training Prescription  Yes    Weight  3lbs     Reps  10-15    Time  10 Minutes      Interval Training   Interval Training  No      Recumbant Bike   Level  1.5    Watts  10    Minutes  10    METs  2      NuStep   Level  2    SPM  80    Minutes  10    METs  2      Track   Laps  9    Minutes  10    METs  2.5       Nutrition:  Target Goals: Understanding of nutrition guidelines, daily intake of sodium 1500mg , cholesterol 200mg , calories 30% from fat and 7% or less from saturated fats, daily to have 5 or more servings of fruits and vegetables.  Biometrics: Pre Biometrics - 11/14/17 1136      Pre Biometrics   Waist Circumference  32 inches    Hip Circumference  39 inches    Waist to Hip Ratio  0.82 %    Triceps Skinfold  20 mm    % Body Fat  33.3 %    Grip Strength  20 kg    Flexibility  0 in    Single Leg Stand  4.3 seconds        Nutrition Therapy Plan and Nutrition Goals: Nutrition Therapy & Goals - 11/14/17 1207      Nutrition Therapy   Diet  General, healthful    Drug/Food  Interactions  Coumadin/Vit K      Personal Nutrition Goals   Nutrition Goal  Pt to liberalize diet to promote energy intake until appetite and loss of taste improve      Intervention Plan   Intervention  Prescribe, educate  and counsel regarding individualized specific dietary modifications aiming towards targeted core components such as weight, hypertension, lipid management, diabetes, heart failure and other comorbidities.;Nutrition handout(s) given to patient. Consistent vitamin K intake    Expected Outcomes  Short Term Goal: Understand basic principles of dietary content, such as calories, fat, sodium, cholesterol and nutrients.;Long Term Goal: Adherence to prescribed nutrition plan.       Nutrition Assessments: Nutrition Assessments - 11/14/17 1208      MEDFICTS Scores   Pre Score  39       Nutrition Goals Re-Evaluation:   Nutrition Goals Re-Evaluation:   Nutrition Goals Discharge (Final Nutrition Goals Re-Evaluation):   Psychosocial: Target Goals: Acknowledge presence or absence of significant depression and/or stress, maximize coping skills, provide positive support system. Participant is able to verbalize types and ability to use techniques and skills needed for reducing stress and depression.  Initial Review & Psychosocial Screening: Initial Psych Review & Screening - 11/14/17 1144      Initial Review   Current issues with  None Identified      Family Dynamics   Good Support System?  Yes Pt's daughter was present at orientation.  Pt also reports that her husband is source of support.      Barriers   Psychosocial barriers to participate in program  There are no identifiable barriers or psychosocial needs.      Screening Interventions   Interventions  Encouraged to exercise       Quality of Life Scores: Quality of Life - 11/14/17 1115      Quality of Life Scores   Health/Function Pre  25.29 %    Socioeconomic Pre  30 %    Psych/Spiritual Pre  29.14 %     Family Pre  28.8 %    GLOBAL Pre  27.56 %      Scores of 19 and below usually indicate a poorer quality of life in these areas.  A difference of  2-3 points is a clinically meaningful difference.  A difference of 2-3 points in the total score of the Quality of Life Index has been associated with significant improvement in overall quality of life, self-image, physical symptoms, and general health in studies assessing change in quality of life.  PHQ-9: Recent Review Flowsheet Data    Depression screen [facility] 2/9 11/18/2017 06/03/2017   Decreased Interest 0 0   Down, Depressed, Hopeless 0 0   PHQ - 2 Score 0 0   Altered sleeping - 0   Tired, decreased energy - 0   Change in appetite - 0   Feeling bad or failure about yourself  - 0   Trouble concentrating - 0   Moving slowly or fidgety/restless - 0   Suicidal thoughts - 0   PHQ-9 Score - 0   Difficult doing work/chores - Not difficult at all     Interpretation of Total Score  Total Score Depression Severity:  1-4 = Minimal depression, 5-9 = Mild depression, 10-14 = Moderate depression, 15-19 = Moderately severe depression, 20-27 = Severe depression   Psychosocial Evaluation and Intervention: Psychosocial Evaluation - 11/21/17 1219      Psychosocial Evaluation & Interventions   Interventions  Encouraged to exercise with the program and follow exercise prescription    Comments  No psychosocial needs identified. No interventions necesarry.    Expected Outcomes  Pt will continue to exhibit a positive outlook with good coping skills.    Continue Psychosocial Services   No Follow up required  Psychosocial Re-Evaluation:   Psychosocial Discharge (Final Psychosocial Re-Evaluation):   Vocational Rehabilitation: Provide vocational rehab assistance to qualifying candidates.   Vocational Rehab Evaluation & Intervention: Vocational Rehab - 11/14/17 1140      Initial Vocational Rehab Evaluation & Intervention   Assessment shows need  for Vocational Rehabilitation  No       Education: Education Goals: Education classes will be provided on a weekly basis, covering required topics. Participant will state understanding/return demonstration of topics presented.  Learning Barriers/Preferences: Learning Barriers/Preferences - 11/14/17 0947      Learning Barriers/Preferences   Learning Barriers  Sight cataracts/readers    Learning Preferences  Video;Verbal Instruction;Skilled Demonstration;Pictoral;Written Material       Education Topics: Count Your Pulse:  -Group instruction provided by verbal instruction, demonstration, patient participation and written materials to support subject.  Instructors address importance of being able to find your pulse and how to count your pulse when at home without a heart monitor.  Patients get hands on experience counting their pulse with staff help and individually.   Heart Attack, Angina, and Risk Factor Modification:  -Group instruction provided by verbal instruction, video, and written materials to support subject.  Instructors address signs and symptoms of angina and heart attacks.    Also discuss risk factors for heart disease and how to make changes to improve heart health risk factors.   Functional Fitness:  -Group instruction provided by verbal instruction, demonstration, patient participation, and written materials to support subject.  Instructors address safety measures for doing things around the house.  Discuss how to get up and down off the floor, how to pick things up properly, how to safely get out of a chair without assistance, and balance training.   Meditation and Mindfulness:  -Group instruction provided by verbal instruction, patient participation, and written materials to support subject.  Instructor addresses importance of mindfulness and meditation practice to help reduce stress and improve awareness.  Instructor also leads participants through a meditation exercise.     CARDIAC REHAB PHASE II EXERCISE from 11/20/2017 in [facility] CARDIAC REHAB  Date  11/20/17  Instruction Review Code  1- Verbalizes Understanding      Stretching for Flexibility and Mobility:  -Group instruction provided by verbal instruction, patient participation, and written materials to support subject.  Instructors lead participants through series of stretches that are designed to increase flexibility thus improving mobility.  These stretches are additional exercise for major muscle groups that are typically performed during regular warm up and cool down.   Hands Only CPR:  -Group verbal, video, and participation provides a basic overview of AHA guidelines for community CPR. Role-play of emergencies allow participants the opportunity to practice calling for help and chest compression technique with discussion of AED use.   Hypertension: -Group verbal and written instruction that provides a basic overview of hypertension including the most recent diagnostic guidelines, risk factor reduction with self-care instructions and medication management.    Nutrition I class: Heart Healthy Eating:  -Group instruction provided by PowerPoint slides, verbal discussion, and written materials to support subject matter. The instructor gives an explanation and review of the Therapeutic Lifestyle Changes diet recommendations, which includes a discussion on lipid goals, dietary fat, sodium, fiber, plant stanol/sterol esters, sugar, and the components of a well-balanced, healthy diet.   CARDIAC REHAB PHASE II EXERCISE from 11/20/2017 in [facility] CARDIAC REHAB  Date  11/14/17  Educator  RD      Nutrition II class:  Lifestyle Skills:  -Group instruction provided by PowerPoint slides, verbal discussion, and written materials to support subject matter. The instructor gives an explanation and review of label reading, grocery shopping for heart health, heart healthy  recipe modifications, and ways to make healthier choices when eating out.   CARDIAC REHAB PHASE II EXERCISE from 11/20/2017 in [facility] CARDIAC REHAB  Date  11/14/17  Educator  RD      Diabetes Question & Answer:  -Group instruction provided by PowerPoint slides, verbal discussion, and written materials to support subject matter. The instructor gives an explanation and review of diabetes co-morbidities, pre- and post-prandial blood glucose goals, pre-exercise blood glucose goals, signs, symptoms, and treatment of hypoglycemia and hyperglycemia, and foot care basics.   Diabetes Blitz:  -Group instruction provided by PowerPoint slides, verbal discussion, and written materials to support subject matter. The instructor gives an explanation and review of the physiology behind type 1 and type 2 diabetes, diabetes medications and rational behind using different medications, pre- and post-prandial blood glucose recommendations and Hemoglobin A1c goals, diabetes diet, and exercise including blood glucose guidelines for exercising safely.    Portion Distortion:  -Group instruction provided by PowerPoint slides, verbal discussion, written materials, and food models to support subject matter. The instructor gives an explanation of serving size versus portion size, changes in portions sizes over the last 20 years, and what consists of a serving from each food group.   Stress Management:  -Group instruction provided by verbal instruction, video, and written materials to support subject matter.  Instructors review role of stress in heart disease and how to cope with stress positively.     Exercising on Your Own:  -Group instruction provided by verbal instruction, power point, and written materials to support subject.  Instructors discuss benefits of exercise, components of exercise, frequency and intensity of exercise, and end points for exercise.  Also discuss use of nitroglycerin and  activating EMS.  Review options of places to exercise outside of rehab.  Review guidelines for sex with heart disease.   Cardiac Drugs I:  -Group instruction provided by verbal instruction and written materials to support subject.  Instructor reviews cardiac drug classes: antiplatelets, anticoagulants, beta blockers, and statins.  Instructor discusses reasons, side effects, and lifestyle considerations for each drug class.   Cardiac Drugs II:  -Group instruction provided by verbal instruction and written materials to support subject.  Instructor reviews cardiac drug classes: angiotensin converting enzyme inhibitors (ACE-I), angiotensin II receptor blockers (ARBs), nitrates, and calcium channel blockers.  Instructor discusses reasons, side effects, and lifestyle considerations for each drug class.   Anatomy and Physiology of the Circulatory System:  Group verbal and written instruction and models provide basic cardiac anatomy and physiology, with the coronary electrical and arterial systems. Review of: AMI, Angina, Valve disease, Heart Failure, Peripheral Artery Disease, Cardiac Arrhythmia, Pacemakers, and the ICD.   Other Education:  -Group or individual verbal, written, or video instructions that support the educational goals of the cardiac rehab program.   Holiday Eating Survival Tips:  -Group instruction provided by PowerPoint slides, verbal discussion, and written materials to support subject matter. The instructor gives patients tips, tricks, and techniques to help them not only survive but enjoy the holidays despite the onslaught of food that accompanies the holidays.   Knowledge Questionnaire Score: Knowledge Questionnaire Score - 11/14/17 1115      Knowledge Questionnaire Score   Pre Score  21/24       Core Components/Risk Factors/Patient  Goals at Admission: Personal Goals and Risk Factors at Admission - 11/14/17 1116      Core Components/Risk Factors/Patient Goals on  Admission    Weight Management  Yes       Core Components/Risk Factors/Patient Goals Review:  Goals and Risk Factor Review    Row Name 11/21/17 1218             Core Components/Risk Factors/Patient Goals Review   Personal Goals Review  Weight Management/Obesity       Review  Pt with few CAD RF eager to participate in CR Program.  Lilu is off to a good start with exercise so far. Pt requesting to speak to pharmacist about medication concerns.  Consult placed to facilitate this.        Expected Outcomes  Pt will continue to participate in exercise, nutrition, and lifestyle modification.           Core Components/Risk Factors/Patient Goals at Discharge (Final Review):  Goals and Risk Factor Review - 11/21/17 1218      Core Components/Risk Factors/Patient Goals Review   Personal Goals Review  Weight Management/Obesity    Review  Pt with few CAD RF eager to participate in CR Program.  Raine is off to a good start with exercise so far. Pt requesting to speak to pharmacist about medication concerns.  Consult placed to facilitate this.     Expected Outcomes  Pt will continue to participate in exercise, nutrition, and lifestyle modification.        ITP Comments: ITP Comments    Row Name 11/14/17 [phone] 11/21/17 1218         ITP Comments  Dr. Armanda Magic, Medical Director  30 Day ITP Review. Pt recently started exercise with CR Program.  Nygeria tolerated exercise well.         Comments: See ITP Comments.

## 2017-11-22 ENCOUNTER — Encounter (HOSPITAL_COMMUNITY): Payer: Medicare Other

## 2017-11-22 ENCOUNTER — Encounter (HOSPITAL_COMMUNITY)
Admission: RE | Admit: 2017-11-22 | Discharge: 2017-11-22 | Disposition: A | Discharge status: Home / Self Care | Payer: Medicare Other | Source: Ambulatory Visit | Attending: Cardiology | Admitting: Cardiology

## 2017-11-22 DIAGNOSIS — I341 Nonrheumatic mitral (valve) prolapse: Secondary | ICD-10-CM | POA: Diagnosis not present

## 2017-11-22 DIAGNOSIS — Z9889 Other specified postprocedural states: Secondary | ICD-10-CM

## 2017-11-25 ENCOUNTER — Encounter (HOSPITAL_COMMUNITY): Payer: Medicare Other

## 2017-11-25 ENCOUNTER — Encounter (HOSPITAL_COMMUNITY)
Admission: RE | Admit: 2017-11-25 | Discharge: 2017-11-25 | Disposition: A | Discharge status: Home / Self Care | Payer: Medicare Other | Source: Ambulatory Visit | Attending: Cardiology | Admitting: Cardiology

## 2017-11-25 DIAGNOSIS — Z9889 Other specified postprocedural states: Secondary | ICD-10-CM

## 2017-11-25 DIAGNOSIS — I341 Nonrheumatic mitral (valve) prolapse: Secondary | ICD-10-CM | POA: Diagnosis not present

## 2017-11-25 NOTE — Progress Notes (Signed)
Mrs. Veitch continues to report vivid night terrors.  Has concerns that it is caused by one of her medications.  Requested for pharmacy to call pt to speak to her.  Spoke with pt and daughter, [name], about making an appointment with Evett's PCP to evaluate concerns of night terrors.  [name] is willing to make an appointment that will coordinate with their schedules.  Will continue to monitor throughout the program.

## 2017-11-25 NOTE — Progress Notes (Signed)
Amanda [name] 77 y.o. female DOB: 04-24-1941 MRN: [id number]      Nutrition Note  1. S/P MVR (mitral valve repair)    Nutrition Note Spoke with pt and pt's daughter, [name]. Nutrition plan and survey reviewed with pt. Pt is following a heart healthy diet. Pt continues to c/o decreased desire to eat and food tastes different since her surgery. Lynnda Child, RN, placed another request for the pharmacist to speak with pt. Pt states she has not spoken with a pharmacist yet. Pt expressed understanding of the information reviewed. Pt aware of nutrition education classes offered. Pt previously given nutrition class handouts. Pt denies questions re: nutrition information.   Nutrition Diagnosis ? Food-and nutrition-related knowledge deficit related to lack of exposure to information as related to diagnosis of: ? CVD ? Pre-DM  Nutrition Intervention ? Pt's individual nutrition plan reviewed with pt. ? Pt and pt's daughter, [name], encouraged to make an appt with PCP re: decreased appetite and vivid dreams, which include hallucinations. Lynnda Child, RN aware)  Nutrition Goal(s):  ? Pt to liberalize diet to promote energy intake until appetite and loss of taste improve  Plan:  Pt to attend nutrition classes ? Nutrition I ? Nutrition II ? Portion Distortion  Will provide client-centered nutrition education as part of interdisciplinary care.   Monitor and evaluate progress toward nutrition goal with team.  Mickle Plumb, M.Ed, RD, LDN, CDE 11/25/2017 12:14 PM

## 2017-11-27 ENCOUNTER — Encounter (HOSPITAL_COMMUNITY)
Admission: RE | Admit: 2017-11-27 | Discharge: 2017-11-27 | Disposition: A | Discharge status: Home / Self Care | Payer: Medicare Other | Source: Ambulatory Visit | Attending: Cardiology | Admitting: Cardiology

## 2017-11-27 ENCOUNTER — Encounter (HOSPITAL_COMMUNITY): Payer: Medicare Other

## 2017-11-27 DIAGNOSIS — I341 Nonrheumatic mitral (valve) prolapse: Secondary | ICD-10-CM | POA: Diagnosis not present

## 2017-11-27 DIAGNOSIS — Z9889 Other specified postprocedural states: Secondary | ICD-10-CM

## 2017-11-28 ENCOUNTER — Telehealth (HOSPITAL_COMMUNITY): Payer: Self-pay

## 2017-11-28 NOTE — Telephone Encounter (Signed)
Patient requested a call from a pharmacist to discuss side effects of medications. Patient was called the afternoon of 5/22.  The patient is concerned about decreased appetite, nausea, and nightmares. She talked to Dr. [name] regarding above, who told her he was unaware of any of her current medicines causing nightmares. I agreed with this statement.  The patient was told that the levothyroxine may be associated with the decreased appetite, however, she reports having been stable on this medicine for a long time without issues. The only new medicine is metoprolol tartrate 100 mg twice daily. Patient has been monitoring her blood pressures and reports being in the 110s/70s range. She decided to take only one dose of metoprolol earlier in the day, and reports that since this change she has not had nausea or nightmares.  Patient was advised that it is appropriate to take her medications (with the exception of levothyroxine) in the middle of the day with a meal if that seems to help decrease nausea.   Patient was counseled to contact her cardiologist to let him know of her decrease in metoprolol. She is planning to follow up with him in 1-2 weeks. Patient was given pharmacist contact if any other issues arise before then.

## 2017-11-29 ENCOUNTER — Encounter (HOSPITAL_COMMUNITY): Payer: Medicare Other

## 2017-12-04 ENCOUNTER — Encounter (HOSPITAL_COMMUNITY): Payer: Medicare Other

## 2017-12-06 ENCOUNTER — Encounter (HOSPITAL_COMMUNITY)
Admission: RE | Admit: 2017-12-06 | Discharge: 2017-12-06 | Disposition: A | Discharge status: Home / Self Care | Payer: Medicare Other | Source: Ambulatory Visit | Attending: Cardiology | Admitting: Cardiology

## 2017-12-06 ENCOUNTER — Encounter (HOSPITAL_COMMUNITY): Payer: Medicare Other

## 2017-12-06 DIAGNOSIS — Z9889 Other specified postprocedural states: Secondary | ICD-10-CM

## 2017-12-09 ENCOUNTER — Encounter (HOSPITAL_COMMUNITY): Payer: Medicare Other

## 2017-12-09 ENCOUNTER — Encounter (HOSPITAL_COMMUNITY)
Admission: RE | Admit: 2017-12-09 | Discharge: 2017-12-09 | Disposition: A | Discharge status: Home / Self Care | Payer: Medicare Other | Source: Ambulatory Visit | Attending: Cardiology | Admitting: Cardiology

## 2017-12-09 DIAGNOSIS — I341 Nonrheumatic mitral (valve) prolapse: Secondary | ICD-10-CM | POA: Diagnosis not present

## 2017-12-09 DIAGNOSIS — Z79890 Hormone replacement therapy (postmenopausal): Secondary | ICD-10-CM | POA: Insufficient documentation

## 2017-12-09 DIAGNOSIS — Z8673 Personal history of transient ischemic attack (TIA), and cerebral infarction without residual deficits: Secondary | ICD-10-CM | POA: Insufficient documentation

## 2017-12-09 DIAGNOSIS — I129 Hypertensive chronic kidney disease with stage 1 through stage 4 chronic kidney disease, or unspecified chronic kidney disease: Secondary | ICD-10-CM | POA: Insufficient documentation

## 2017-12-09 DIAGNOSIS — I480 Paroxysmal atrial fibrillation: Secondary | ICD-10-CM | POA: Insufficient documentation

## 2017-12-09 DIAGNOSIS — E785 Hyperlipidemia, unspecified: Secondary | ICD-10-CM | POA: Insufficient documentation

## 2017-12-09 DIAGNOSIS — Z7901 Long term (current) use of anticoagulants: Secondary | ICD-10-CM | POA: Diagnosis not present

## 2017-12-09 DIAGNOSIS — E039 Hypothyroidism, unspecified: Secondary | ICD-10-CM | POA: Diagnosis not present

## 2017-12-09 DIAGNOSIS — K589 Irritable bowel syndrome without diarrhea: Secondary | ICD-10-CM | POA: Insufficient documentation

## 2017-12-09 DIAGNOSIS — Z9889 Other specified postprocedural states: Secondary | ICD-10-CM | POA: Diagnosis present

## 2017-12-09 DIAGNOSIS — Z79899 Other long term (current) drug therapy: Secondary | ICD-10-CM | POA: Diagnosis not present

## 2017-12-09 DIAGNOSIS — N183 Chronic kidney disease, stage 3 (moderate): Secondary | ICD-10-CM | POA: Insufficient documentation

## 2017-12-09 DIAGNOSIS — Z950 Presence of cardiac pacemaker: Secondary | ICD-10-CM | POA: Diagnosis not present

## 2017-12-09 DIAGNOSIS — Z8679 Personal history of other diseases of the circulatory system: Secondary | ICD-10-CM | POA: Diagnosis present

## 2017-12-10 ENCOUNTER — Ambulatory Visit (INDEPENDENT_AMBULATORY_CARE_PROVIDER_SITE_OTHER): Payer: Medicare Other | Admitting: *Deleted

## 2017-12-10 DIAGNOSIS — Z5181 Encounter for therapeutic drug level monitoring: Secondary | ICD-10-CM | POA: Diagnosis not present

## 2017-12-10 DIAGNOSIS — I480 Paroxysmal atrial fibrillation: Secondary | ICD-10-CM

## 2017-12-10 DIAGNOSIS — Z7901 Long term (current) use of anticoagulants: Secondary | ICD-10-CM

## 2017-12-10 DIAGNOSIS — Z8673 Personal history of transient ischemic attack (TIA), and cerebral infarction without residual deficits: Secondary | ICD-10-CM

## 2017-12-10 LAB — POCT INR: INR: 2.3 (ref 2.0–3.0)

## 2017-12-10 NOTE — Patient Instructions (Addendum)
Description   Continue same dose of coumadin 1 tablet daily except 2 tablets on Sundays.  Recheck INR in 4 weeks. Call us with medication changes or concerns, Main # [phone]. [facility] # [phone].

## 2017-12-11 ENCOUNTER — Encounter (HOSPITAL_COMMUNITY)
Admission: RE | Admit: 2017-12-11 | Discharge: 2017-12-11 | Disposition: A | Discharge status: Home / Self Care | Payer: Medicare Other | Source: Ambulatory Visit | Attending: Cardiology | Admitting: Cardiology

## 2017-12-11 ENCOUNTER — Encounter (HOSPITAL_COMMUNITY): Payer: Medicare Other

## 2017-12-11 DIAGNOSIS — I341 Nonrheumatic mitral (valve) prolapse: Secondary | ICD-10-CM | POA: Diagnosis not present

## 2017-12-11 DIAGNOSIS — Z9889 Other specified postprocedural states: Secondary | ICD-10-CM

## 2017-12-13 ENCOUNTER — Encounter (HOSPITAL_COMMUNITY): Payer: Medicare Other

## 2017-12-13 ENCOUNTER — Encounter (HOSPITAL_COMMUNITY)
Admission: RE | Admit: 2017-12-13 | Discharge: 2017-12-13 | Disposition: A | Discharge status: Home / Self Care | Payer: Medicare Other | Source: Ambulatory Visit | Attending: Cardiology | Admitting: Cardiology

## 2017-12-13 DIAGNOSIS — Z9889 Other specified postprocedural states: Secondary | ICD-10-CM

## 2017-12-13 DIAGNOSIS — I341 Nonrheumatic mitral (valve) prolapse: Secondary | ICD-10-CM | POA: Diagnosis not present

## 2017-12-16 ENCOUNTER — Encounter (HOSPITAL_COMMUNITY)
Admission: RE | Admit: 2017-12-16 | Discharge: 2017-12-16 | Disposition: A | Discharge status: Home / Self Care | Payer: Medicare Other | Source: Ambulatory Visit | Attending: Cardiology | Admitting: Cardiology

## 2017-12-16 ENCOUNTER — Encounter (HOSPITAL_COMMUNITY): Payer: Medicare Other

## 2017-12-16 DIAGNOSIS — I341 Nonrheumatic mitral (valve) prolapse: Secondary | ICD-10-CM | POA: Diagnosis not present

## 2017-12-16 DIAGNOSIS — Z9889 Other specified postprocedural states: Secondary | ICD-10-CM

## 2017-12-18 ENCOUNTER — Encounter (HOSPITAL_COMMUNITY)
Admission: RE | Admit: 2017-12-18 | Discharge: 2017-12-18 | Disposition: A | Discharge status: Home / Self Care | Payer: Medicare Other | Source: Ambulatory Visit | Attending: Cardiology | Admitting: Cardiology

## 2017-12-18 ENCOUNTER — Encounter (HOSPITAL_COMMUNITY): Payer: Medicare Other

## 2017-12-18 DIAGNOSIS — Z9889 Other specified postprocedural states: Secondary | ICD-10-CM

## 2017-12-18 DIAGNOSIS — I341 Nonrheumatic mitral (valve) prolapse: Secondary | ICD-10-CM | POA: Diagnosis not present

## 2017-12-18 NOTE — Progress Notes (Signed)
I have reviewed a Home Exercise Prescription with Amanda [name] . Amanda [name] is not currently exercising at home. The patient was advised to walk 1-2 days a week for 20-30 minutes.  Amanda [name] and I discussed how to progress their exercise prescription. The patient stated that they understand the exercise prescription.  We reviewed exercise guidelines, target heart rate during exercise, RPE Scale, weather conditions, NTG use, endpoints for exercise, warmup and cool down.  Patient is encouraged to come to me with any questions. I will continue to follow up with the patient to assist them with progression and safety.    Javier Docker Derell Bruun  1:51 PM 12/18/2017

## 2017-12-19 ENCOUNTER — Encounter (HOSPITAL_COMMUNITY): Payer: Self-pay

## 2017-12-19 NOTE — Progress Notes (Signed)
Cardiac Individual Treatment Plan  Patient Details  Name: YAILEEN HOFFERBER MRN: [id number] Date of Birth: [birth year] Referring Provider:     CARDIAC REHAB PHASE II ORIENTATION from 11/14/2017 in [facility] CARDIAC REHAB  Referring Provider  Quintella Reichert MD       Initial Encounter Date:    CARDIAC REHAB PHASE II ORIENTATION from 11/14/2017 in [facility] CARDIAC REHAB  Date  11/14/17  Referring Provider  Quintella Reichert MD       Visit Diagnosis: S/P MVR (mitral valve repair)  Patient's Home Medications on Admission:  Current Outpatient Medications:  .  acetaminophen (TYLENOL) 500 MG tablet, Take 500 mg by mouth every 6 (six) hours as needed. , Disp: , Rfl:  .  aMILoride (MIDAMOR) 5 MG tablet, Take 5 mg by mouth daily., Disp: , Rfl:  .  calcitRIOL (ROCALTROL) 0.25 MCG capsule, Take 0.25 mcg by mouth every 3 (three) days. In the morning , Disp: , Rfl:  .  conjugated estrogens (PREMARIN) vaginal cream, Place 1 Applicatorful vaginally at bedtime. , Disp: , Rfl:  .  fexofenadine (ALLEGRA) 180 MG tablet, Take 180 mg by mouth daily., Disp: , Rfl:  .  fluticasone (FLONASE) 50 MCG/ACT nasal spray, Place 2 sprays into both nostrils daily. , Disp: , Rfl:  .  levothyroxine (SYNTHROID, LEVOTHROID) 75 MCG tablet, Take 1 tablet (75 mcg total) by mouth See admin instructions. Take 75 mcg by mouth daily around 0300 (Patient taking differently: Take 75 mcg by mouth daily at 2 am. (0300)), Disp: 30 tablet, Rfl: 0 .  metoprolol tartrate (LOPRESSOR) 100 MG tablet, Take 1 tablet (100 mg total) 2 (two) times daily by mouth., Disp: 180 tablet, Rfl: 3 .  naproxen sodium (ALEVE) 220 MG tablet, Take 220 mg by mouth daily as needed (for severe back pain.)., Disp: , Rfl:  .  Probiotic Product (PROBIOTIC DAILY PO), Take 1 capsule by mouth 2 (two) times a week. In the morning, Disp: , Rfl:  .  vitamin B-12 (CYANOCOBALAMIN) 1000 MCG tablet, Take 1,000 mcg by mouth once a week.,  Disp: , Rfl:  .  warfarin (COUMADIN) 1 MG tablet, Take 1 tablet (1 mg total) by mouth daily. (Patient taking differently: Take 1 mg by mouth every evening. ), Disp: 30 tablet, Rfl: 11  Past Medical History: Past Medical History:  Diagnosis Date  . Allergic rhinitis   . Anemia   . Asymptomatic LV dysfunction    EF 45-50% echo 07/2017  . CKD (chronic kidney disease), stage III (HCC)    stage III  . DJD (degenerative joint disease)   . Glomerulonephritis    Dr [name]  . Hyperlipidemia   . Hyperparathyroidism (HCC)   . Hypertension   . Hypothyroidism   . IBS (irritable bowel syndrome)   . Interstitial cystitis   . Mitral regurgitation   . Mobitz II    a. s/p STJ dual chamber PPM   . MVP (mitral valve prolapse)    moderate posterior MVP with moderate MR and grade II diasotlic dysfunction  . PAC (premature atrial contraction)   . PAF (paroxysmal atrial fibrillation) (HCC)     note on pacer check. CHADS2VASC score is 4 now on Eliquis.  [name] PVC's (premature ventricular contractions)   . S/P minimally invasive maze operation for atrial fibrillation 04/16/2017   Complete bilateral atrial lesion set using cryothermy and bipolar radiofrequency ablation with clipping of LA appendage via right mini thoracotomy approach  . S/P minimally  invasive mitral valve repair 04/16/2017   Complex valvuloplasty including triangular resection of posterior leaflet, artificial Gore-tex neochords x6 and Sorin Memo 3D ring annuloplasty ([id number], size 32, serial # [id number])  . S/P placement of cardiac pacemaker   . Small vessel disease, cerebrovascular   . Stroke [facility])    a. old stroke seen on imaging.    Tobacco Use: Social History   Tobacco Use  Smoking Status Former Smoker  Smokeless Tobacco Never Used    Labs: Recent Hydrographic surveyor    Labs for ITP Cardiac and Pulmonary Rehab Latest Ref Rng & Units 04/17/2017 04/18/2017 04/19/2017 04/20/2017 04/22/2017   Cholestrol 0 - 200 mg/dL - - - - -    LDLCALC 0 - 99 mg/dL - - - - -   HDL >16 mg/dL - - - - -   Trlycerides <150 mg/dL - - - 109 604(V)   Hemoglobin A1c 4.8 - 5.6 % - - - - -   PHART 7.350 - 7.450 7.334(L) 7.373 7.520(H) - -   PCO2ART 32.0 - 48.0 mmHg 38.1 40.6 37.9 - -   HCO3 20.0 - 28.0 mmol/L 20.4 23.3 30.8(H) - -   TCO2 22 - 32 mmol/L 22 - - - -   ACIDBASEDEF 0.0 - 2.0 mmol/L 5.0(H) 1.3 - - -   O2SAT % 94.0 91.0 94.5 - -      Capillary Blood Glucose: Lab Results  Component Value Date   GLUCAP 106 (H) 04/26/2017   GLUCAP 108 (H) 04/26/2017   GLUCAP 54 (L) 04/26/2017   GLUCAP 87 04/26/2017   GLUCAP 141 (H) 04/25/2017     Exercise Target Goals:    Exercise Program Goal: Individual exercise prescription set using results from initial 6 min walk test and THRR while considering  patient's activity barriers and safety.   Exercise Prescription Goal: Initial exercise prescription builds to 30-45 minutes a day of aerobic activity, 2-3 days per week.  Home exercise guidelines will be given to patient during program as part of exercise prescription that the participant will acknowledge.  Activity Barriers & Risk Stratification: Activity Barriers & Cardiac Risk Stratification - 11/14/17 1106      Activity Barriers & Cardiac Risk Stratification   Activity Barriers  Muscular Weakness;Deconditioning;Other (comment);Balance Concerns    Comments  neck stiffness and B hip pain    Cardiac Risk Stratification  High       6 Minute Walk: 6 Minute Walk    Row Name 11/14/17 1105         6 Minute Walk   Phase  Initial     Distance  1200 feet     Walk Time  6 minutes     # of Rest Breaks  0     MPH  2.27     METS  2.49     RPE  11     Perceived Dyspnea   0     VO2 Peak  8.71     Symptoms  No     Resting HR  62 bpm     Resting BP  124/75     Resting Oxygen Saturation   99 %     Exercise Oxygen Saturation  during 6 min walk  100 %     Max Ex. HR  81 bpm     Max Ex. BP  134/75     2 Minute Post BP  123/73         Oxygen Initial Assessment:  Oxygen Re-Evaluation:   Oxygen Discharge (Final Oxygen Re-Evaluation):   Initial Exercise Prescription: Initial Exercise Prescription - 11/14/17 1100      Date of Initial Exercise RX and Referring Provider   Date  11/14/17    Referring Provider  Quintella Reichert MD       Recumbant Bike   Level  1.5    Watts  10    Minutes  10    METs  2.52      NuStep   Level  2    SPM  70    Minutes  10    METs  2      Track   Laps  9    Minutes  10    METs  2.53      Prescription Details   Frequency (times per week)  3x    Duration  Progress to 30 minutes of continuous aerobic without signs/symptoms of physical distress      Intensity   THRR 40-80% of Max Heartrate  57-114    Ratings of Perceived Exertion  11-13    Perceived Dyspnea  0-4      Progression   Progression  Continue progressive overload as per policy without signs/symptoms or physical distress.      Resistance Training   Training Prescription  Yes    Weight  2lbs    Reps  10-15       Perform Capillary Blood Glucose checks as needed.  Exercise Prescription Changes: Exercise Prescription Changes    Row Name 11/18/17 1150 12/09/17 1353 12/16/17 1600         Response to Exercise   Blood Pressure (Admit)  118/70  104/60  114/80     Blood Pressure (Exercise)  124/68  130/64  110/62     Blood Pressure (Exit)  104/60  102/60  96/64     Heart Rate (Admit)  70 bpm  57 bpm  87 bpm     Heart Rate (Exercise)  86 bpm  101 bpm  93 bpm     Heart Rate (Exit)  74 bpm  57 bpm  87 bpm     Rating of Perceived Exertion (Exercise)  12  13  15      Perceived Dyspnea (Exercise)  0  0  0     Symptoms  None  None  None     Comments  Pt oriented to exercise equipment  Arm Crank added to prescription due to leg pain on bike    -     Duration  Progress to 30 minutes of  aerobic without signs/symptoms of physical distress  Progress to 30 minutes of  aerobic without signs/symptoms of physical  distress  Continue with 30 min of aerobic exercise without signs/symptoms of physical distress.     Intensity  THRR New  THRR unchanged  THRR unchanged       Progression   Progression  Continue to progress workloads to maintain intensity without signs/symptoms of physical distress.  Continue to progress workloads to maintain intensity without signs/symptoms of physical distress.  Continue to progress workloads to maintain intensity without signs/symptoms of physical distress.     Average METs  2.29  2.4  3.04       Resistance Training   Training Prescription  Yes  Yes  Yes     Weight  3lbs   3lbs   3lbs     Reps  10-15  10-15  10-15     Time  10 Minutes  -  10 Minutes       Interval Training   Interval Training  No  No  No       Recumbant Bike   Level  1.5  -  -     Watts  10  -  -     Minutes  10  -  -     METs  2  -  -       NuStep   Level  2  2  2      SPM  80  85  85     Minutes  10  10  10      METs  2  2.3  2.2       Arm Ergometer   Level  -  1  1     Watts  -  25  25     Minutes  -  10  10     METs  -  3.13  3.13       Track   Laps  9  15  16      Minutes  10  10  10      METs  2.5  3.09  3.79        Exercise Comments: Exercise Comments    Row Name 11/21/17 1155 12/18/17 1352         Exercise Comments  Pt is off to a good start with exercise. Pt responded well to first two exercise sessions. Will continue to monitor and progress pt as tolerated.   Reviewed Home Exercise Program with pt. Pt was very receptive to information. Will continue to monitor and progres pt as tolerated.          Exercise Goals and Review: Exercise Goals    Row Name 11/14/17 0948             Exercise Goals   Increase Physical Activity  Yes       Intervention  Provide advice, education, support and counseling about physical activity/exercise needs.;Develop an individualized exercise prescription for aerobic and resistive training based on initial evaluation findings, risk  stratification, comorbidities and participant's personal goals.       Expected Outcomes  Short Term: Attend rehab on a regular basis to increase amount of physical activity.;Long Term: Add in home exercise to make exercise part of routine and to increase amount of physical activity.;Long Term: Exercising regularly at least 3-5 days a week.       Increase Strength and Stamina  Yes return to gardening and yardwork       Intervention  Provide advice, education, support and counseling about physical activity/exercise needs.;Develop an individualized exercise prescription for aerobic and resistive training based on initial evaluation findings, risk stratification, comorbidities and participant's personal goals.       Expected Outcomes  Short Term: Increase workloads from initial exercise prescription for resistance, speed, and METs.;Short Term: Perform resistance training exercises routinely during rehab and add in resistance training at home;Long Term: Improve cardiorespiratory fitness, muscular endurance and strength as measured by increased METs and functional capacity ( )       Able to understand and use rate of perceived exertion (RPE) scale  Yes       Intervention  Provide education and explanation on how to use RPE scale       Expected Outcomes  Short Term: Able to use RPE daily in rehab to express subjective intensity level;Long Term:  Able to use  RPE to guide intensity level when exercising independently       Intervention  Provide education and explanation on how to use Dyspnea scale       Expected Outcomes  Short Term: Able to use Dyspnea scale daily in rehab to express subjective sense of shortness of breath during exertion;Long Term: Able to use Dyspnea scale to guide intensity level when exercising independently       Knowledge and understanding of Target Heart Rate Range (THRR)  Yes       Intervention  Provide education and explanation of THRR including how the numbers were predicted and  where they are located for reference       Expected Outcomes  Short Term: Able to state/look up THRR;Long Term: Able to use THRR to govern intensity when exercising independently;Short Term: Able to use daily as guideline for intensity in rehab       Able to check pulse independently  Yes       Intervention  Provide education and demonstration on how to check pulse in carotid and radial arteries.;Review the importance of being able to check your own pulse for safety during independent exercise       Expected Outcomes  Short Term: Able to explain why pulse checking is important during independent exercise;Long Term: Able to check pulse independently and accurately       Understanding of Exercise Prescription  Yes       Intervention  Provide education, explanation, and written materials on patient's individual exercise prescription       Expected Outcomes  Short Term: Able to explain program exercise prescription;Long Term: Able to explain home exercise prescription to exercise independently          Exercise Goals Re-Evaluation : Exercise Goals Re-Evaluation    Row Name 12/18/17 1353             Exercise Goal Re-Evaluation   Exercise Goals Review  Increase Physical Activity;Increase Strength and Stamina;Able to understand and use rate of perceived exertion (RPE) scale;Knowledge and understanding of Target Heart Rate Range (THRR);Able to check pulse independently;Understanding of Exercise Prescription       Comments  Reviewed Home Exercise Program with pt. Also reviewed THRR, RPE Scale, weather precautions, endpoints of exericise, NTG use, warm and cool down.        Expected Outcomes  Pt will walk 1-2 days a week for 15-30 minutes with husband for home exercise. Pt will continue to increase funtional capacity. Will increase pt's workloads as tolerated.            Discharge Exercise Prescription (Final Exercise Prescription Changes): Exercise Prescription Changes - 12/16/17 1600       Response to Exercise   Blood Pressure (Admit)  114/80    Blood Pressure (Exercise)  110/62    Blood Pressure (Exit)  96/64    Heart Rate (Admit)  87 bpm    Heart Rate (Exercise)  93 bpm    Heart Rate (Exit)  87 bpm    Rating of Perceived Exertion (Exercise)  15    Perceived Dyspnea (Exercise)  0    Symptoms  None    Duration  Continue with 30 min of aerobic exercise without signs/symptoms of physical distress.    Intensity  THRR unchanged      Progression   Progression  Continue to progress workloads to maintain intensity without signs/symptoms of physical distress.    Average METs  3.04      Resistance Training  Training Prescription  Yes    Weight  3lbs    Reps  10-15    Time  10 Minutes      Interval Training   Interval Training  No      NuStep   Level  2    SPM  85    Minutes  10    METs  2.2      Arm Ergometer   Level  1    Watts  25    Minutes  10    METs  3.13      Track   Laps  16    Minutes  10    METs  3.79       Nutrition:  Target Goals: Understanding of nutrition guidelines, daily intake of sodium 1500mg , cholesterol 200mg , calories 30% from fat and 7% or less from saturated fats, daily to have 5 or more servings of fruits and vegetables.  Biometrics: Pre Biometrics - 11/14/17 1136      Pre Biometrics   Waist Circumference  32 inches    Hip Circumference  39 inches    Waist to Hip Ratio  0.82 %    Triceps Skinfold  20 mm    % Body Fat  33.3 %    Grip Strength  20 kg    Flexibility  0 in    Single Leg Stand  4.3 seconds        Nutrition Therapy Plan and Nutrition Goals: Nutrition Therapy & Goals - 11/14/17 1207      Nutrition Therapy   Diet  General, healthful    Drug/Food Interactions  Coumadin/Vit K      Personal Nutrition Goals   Nutrition Goal  Pt to liberalize diet to promote energy intake until appetite and loss of taste improve      Intervention Plan   Intervention  Prescribe, educate and counsel regarding individualized  specific dietary modifications aiming towards targeted core components such as weight, hypertension, lipid management, diabetes, heart failure and other comorbidities.;Nutrition handout(s) given to patient. Consistent vitamin K intake    Expected Outcomes  Short Term Goal: Understand basic principles of dietary content, such as calories, fat, sodium, cholesterol and nutrients.;Long Term Goal: Adherence to prescribed nutrition plan.       Nutrition Assessments: Nutrition Assessments - 11/14/17 1208      MEDFICTS Scores   Pre Score  39       Nutrition Goals Re-Evaluation:   Nutrition Goals Re-Evaluation:   Nutrition Goals Discharge (Final Nutrition Goals Re-Evaluation):   Psychosocial: Target Goals: Acknowledge presence or absence of significant depression and/or stress, maximize coping skills, provide positive support system. Participant is able to verbalize types and ability to use techniques and skills needed for reducing stress and depression.  Initial Review & Psychosocial Screening: Initial Psych Review & Screening - 11/14/17 1144      Initial Review   Current issues with  None Identified      Family Dynamics   Good Support System?  Yes Pt's daughter was present at orientation.  Pt also reports that her husband is source of support.      Barriers   Psychosocial barriers to participate in program  There are no identifiable barriers or psychosocial needs.      Screening Interventions   Interventions  Encouraged to exercise       Quality of Life Scores: Quality of Life - 11/14/17 1115      Quality of Life Scores   Health/Function  Pre  25.29 %    Socioeconomic Pre  30 %    Psych/Spiritual Pre  29.14 %    Family Pre  28.8 %    GLOBAL Pre  27.56 %      Scores of 19 and below usually indicate a poorer quality of life in these areas.  A difference of  2-3 points is a clinically meaningful difference.  A difference of 2-3 points in the total score of the Quality of  Life Index has been associated with significant improvement in overall quality of life, self-image, physical symptoms, and general health in studies assessing change in quality of life.  PHQ-9: Recent Review Flowsheet Data    Depression screen [facility] 2/9 11/18/2017 06/03/2017   Decreased Interest 0 0   Down, Depressed, Hopeless 0 0   PHQ - 2 Score 0 0   Altered sleeping - 0   Tired, decreased energy - 0   Change in appetite - 0   Feeling bad or failure about yourself  - 0   Trouble concentrating - 0   Moving slowly or fidgety/restless - 0   Suicidal thoughts - 0   PHQ-9 Score - 0   Difficult doing work/chores - Not difficult at all     Interpretation of Total Score  Total Score Depression Severity:  1-4 = Minimal depression, 5-9 = Mild depression, 10-14 = Moderate depression, 15-19 = Moderately severe depression, 20-27 = Severe depression   Psychosocial Evaluation and Intervention: Psychosocial Evaluation - 11/21/17 1219      Psychosocial Evaluation & Interventions   Interventions  Encouraged to exercise with the program and follow exercise prescription    Comments  No psychosocial needs identified. No interventions necesarry.    Expected Outcomes  Pt will continue to exhibit a positive outlook with good coping skills.    Continue Psychosocial Services   No Follow up required       Psychosocial Re-Evaluation: Psychosocial Re-Evaluation    Row Name 12/19/17 0749             Psychosocial Re-Evaluation   Current issues with  None Identified       Comments  No psychosocial needs identified. No intervention necessary.       Expected Outcomes  Mckay will continue to exhibit a positive outlook with good coping skills.       Interventions  Encouraged to attend Cardiac Rehabilitation for the exercise       Continue Psychosocial Services   No Follow up required          Psychosocial Discharge (Final Psychosocial Re-Evaluation): Psychosocial Re-Evaluation - 12/19/17 0749       Psychosocial Re-Evaluation   Current issues with  None Identified    Comments  No psychosocial needs identified. No intervention necessary.    Expected Outcomes  Malissia will continue to exhibit a positive outlook with good coping skills.    Interventions  Encouraged to attend Cardiac Rehabilitation for the exercise    Continue Psychosocial Services   No Follow up required       Vocational Rehabilitation: Provide vocational rehab assistance to qualifying candidates.   Vocational Rehab Evaluation & Intervention: Vocational Rehab - 11/14/17 1140      Initial Vocational Rehab Evaluation & Intervention   Assessment shows need for Vocational Rehabilitation  No       Education: Education Goals: Education classes will be provided on a weekly basis, covering required topics. Participant will state understanding/return demonstration of topics presented.  Learning Barriers/Preferences:  Learning Barriers/Preferences - 11/14/17 0947      Learning Barriers/Preferences   Learning Barriers  Sight cataracts/readers    Learning Preferences  Video;Verbal Instruction;Skilled Demonstration;Pictoral;Written Material       Education Topics: Count Your Pulse:  -Group instruction provided by verbal instruction, demonstration, patient participation and written materials to support subject.  Instructors address importance of being able to find your pulse and how to count your pulse when at home without a heart monitor.  Patients get hands on experience counting their pulse with staff help and individually.   CARDIAC REHAB PHASE II EXERCISE from 12/18/2017 in [facility] CARDIAC REHAB  Date  11/22/17  (Pended)   Instruction Review Code  1- Verbalizes Understanding  (Pended)       Heart Attack, Angina, and Risk Factor Modification:  -Group instruction provided by verbal instruction, video, and written materials to support subject.  Instructors address signs and symptoms of angina and  heart attacks.    Also discuss risk factors for heart disease and how to make changes to improve heart health risk factors.   Functional Fitness:  -Group instruction provided by verbal instruction, demonstration, patient participation, and written materials to support subject.  Instructors address safety measures for doing things around the house.  Discuss how to get up and down off the floor, how to pick things up properly, how to safely get out of a chair without assistance, and balance training.   CARDIAC REHAB PHASE II EXERCISE from 12/18/2017 in [facility] CARDIAC REHAB  Date  11/27/17  (Pended)   Instruction Review Code  1- Verbalizes Understanding  (Pended)       Meditation and Mindfulness:  -Group instruction provided by verbal instruction, patient participation, and written materials to support subject.  Instructor addresses importance of mindfulness and meditation practice to help reduce stress and improve awareness.  Instructor also leads participants through a meditation exercise.    CARDIAC REHAB PHASE II EXERCISE from 12/18/2017 in [facility] CARDIAC REHAB  Date  11/20/17  (Pended)   Instruction Review Code  1- Verbalizes Understanding  (Pended)       Stretching for Flexibility and Mobility:  -Group instruction provided by verbal instruction, patient participation, and written materials to support subject.  Instructors lead participants through series of stretches that are designed to increase flexibility thus improving mobility.  These stretches are additional exercise for major muscle groups that are typically performed during regular warm up and cool down.   Hands Only CPR:  -Group verbal, video, and participation provides a basic overview of AHA guidelines for community CPR. Role-play of emergencies allow participants the opportunity to practice calling for help and chest compression technique with discussion of AED  use.   Hypertension: -Group verbal and written instruction that provides a basic overview of hypertension including the most recent diagnostic guidelines, risk factor reduction with self-care instructions and medication management.    Nutrition I class: Heart Healthy Eating:  -Group instruction provided by PowerPoint slides, verbal discussion, and written materials to support subject matter. The instructor gives an explanation and review of the Therapeutic Lifestyle Changes diet recommendations, which includes a discussion on lipid goals, dietary fat, sodium, fiber, plant stanol/sterol esters, sugar, and the components of a well-balanced, healthy diet.   CARDIAC REHAB PHASE II EXERCISE from 12/18/2017 in [facility] CARDIAC REHAB  Date  11/14/17  (Pended)   Educator  RD  (Pended)       Nutrition II  class: Lifestyle Skills:  -Group instruction provided by PowerPoint slides, verbal discussion, and written materials to support subject matter. The instructor gives an explanation and review of label reading, grocery shopping for heart health, heart healthy recipe modifications, and ways to make healthier choices when eating out.   CARDIAC REHAB PHASE II EXERCISE from 12/18/2017 in [facility] CARDIAC REHAB  Date  11/14/17  (Pended)   Educator  RD  (Pended)       Diabetes Question & Answer:  -Group instruction provided by PowerPoint slides, verbal discussion, and written materials to support subject matter. The instructor gives an explanation and review of diabetes co-morbidities, pre- and post-prandial blood glucose goals, pre-exercise blood glucose goals, signs, symptoms, and treatment of hypoglycemia and hyperglycemia, and foot care basics.   Diabetes Blitz:  -Group instruction provided by PowerPoint slides, verbal discussion, and written materials to support subject matter. The instructor gives an explanation and review of the physiology behind type 1 and  type 2 diabetes, diabetes medications and rational behind using different medications, pre- and post-prandial blood glucose recommendations and Hemoglobin A1c goals, diabetes diet, and exercise including blood glucose guidelines for exercising safely.    Portion Distortion:  -Group instruction provided by PowerPoint slides, verbal discussion, written materials, and food models to support subject matter. The instructor gives an explanation of serving size versus portion size, changes in portions sizes over the last 20 years, and what consists of a serving from each food group.   Stress Management:  -Group instruction provided by verbal instruction, video, and written materials to support subject matter.  Instructors review role of stress in heart disease and how to cope with stress positively.     Exercising on Your Own:  -Group instruction provided by verbal instruction, power point, and written materials to support subject.  Instructors discuss benefits of exercise, components of exercise, frequency and intensity of exercise, and end points for exercise.  Also discuss use of nitroglycerin and activating EMS.  Review options of places to exercise outside of rehab.  Review guidelines for sex with heart disease.   Cardiac Drugs I:  -Group instruction provided by verbal instruction and written materials to support subject.  Instructor reviews cardiac drug classes: antiplatelets, anticoagulants, beta blockers, and statins.  Instructor discusses reasons, side effects, and lifestyle considerations for each drug class.   Cardiac Drugs II:  -Group instruction provided by verbal instruction and written materials to support subject.  Instructor reviews cardiac drug classes: angiotensin converting enzyme inhibitors (ACE-I), angiotensin II receptor blockers (ARBs), nitrates, and calcium channel blockers.  Instructor discusses reasons, side effects, and lifestyle considerations for each drug  class.   Anatomy and Physiology of the Circulatory System:  Group verbal and written instruction and models provide basic cardiac anatomy and physiology, with the coronary electrical and arterial systems. Review of: AMI, Angina, Valve disease, Heart Failure, Peripheral Artery Disease, Cardiac Arrhythmia, Pacemakers, and the ICD.   CARDIAC REHAB PHASE II EXERCISE from 12/18/2017 in [facility] CARDIAC REHAB  Date  12/11/17  (Pended)   Instruction Review Code  2- Demonstrated Understanding  (Pended)       Other Education:  -Group or individual verbal, written, or video instructions that support the educational goals of the cardiac rehab program.   Holiday Eating Survival Tips:  -Group instruction provided by PowerPoint slides, verbal discussion, and written materials to support subject matter. The instructor gives patients tips, tricks, and techniques to help them not only survive but enjoy the  holidays despite the onslaught of food that accompanies the holidays.   Knowledge Questionnaire Score: Knowledge Questionnaire Score - 11/14/17 1115      Knowledge Questionnaire Score   Pre Score  21/24       Core Components/Risk Factors/Patient Goals at Admission: Personal Goals and Risk Factors at Admission - 11/14/17 1116      Core Components/Risk Factors/Patient Goals on Admission    Weight Management  Yes       Core Components/Risk Factors/Patient Goals Review:  Goals and Risk Factor Review    Row Name 11/21/17 1218 12/19/17 0745           Core Components/Risk Factors/Patient Goals Review   Personal Goals Review  Weight Management/Obesity  Weight Management/Obesity      Review  Pt with few CAD RF eager to participate in CR Program.  Treyana is off to a good start with exercise so far. Pt requesting to speak to pharmacist about medication concerns.  Consult placed to facilitate this.   Pt with few CAD RF eager to participate in CR Program.  Johneisha spoke with a  pharmacist regarding medication concerns.  Eden feels as if she is stronger and has more energy.  Snow stated that her family has noted this as well.       Expected Outcomes  Pt will continue to participate in exercise, nutrition, and lifestyle modification.   Pt will continue to participate in exercise, nutrition, and lifestyle modification.          Core Components/Risk Factors/Patient Goals at Discharge (Final Review):  Goals and Risk Factor Review - 12/19/17 0745      Core Components/Risk Factors/Patient Goals Review   Personal Goals Review  Weight Management/Obesity    Review  Pt with few CAD RF eager to participate in CR Program.  Carrieanne spoke with a pharmacist regarding medication concerns.  Xiara feels as if she is stronger and has more energy.  Shellsea stated that her family has noted this as well.     Expected Outcomes  Pt will continue to participate in exercise, nutrition, and lifestyle modification.        ITP Comments: ITP Comments    Row Name 11/14/17 [phone] 11/21/17 1218 12/19/17 0744       ITP Comments  Dr. Armanda Magic, Medical Director  30 Day ITP Review. Pt recently started exercise with CR Program.  Toyoko tolerated exercise well.  30 Day ITP Review. Pt continues to tolerate exercise well. Lynetta feels that she is stronger and has more energy.        Comments: See ITP Comments

## 2017-12-20 ENCOUNTER — Encounter (HOSPITAL_COMMUNITY)
Admission: RE | Admit: 2017-12-20 | Discharge: 2017-12-20 | Disposition: A | Discharge status: Home / Self Care | Payer: Medicare Other | Source: Ambulatory Visit | Attending: Cardiology | Admitting: Cardiology

## 2017-12-20 ENCOUNTER — Encounter (HOSPITAL_COMMUNITY): Payer: Medicare Other

## 2017-12-20 DIAGNOSIS — Z9889 Other specified postprocedural states: Secondary | ICD-10-CM

## 2017-12-20 DIAGNOSIS — I341 Nonrheumatic mitral (valve) prolapse: Secondary | ICD-10-CM | POA: Diagnosis not present

## 2017-12-23 ENCOUNTER — Encounter (HOSPITAL_COMMUNITY): Payer: Medicare Other

## 2017-12-25 ENCOUNTER — Encounter (HOSPITAL_COMMUNITY)
Admission: RE | Admit: 2017-12-25 | Discharge: 2017-12-25 | Disposition: A | Discharge status: Home / Self Care | Payer: Medicare Other | Source: Ambulatory Visit | Attending: Cardiology | Admitting: Cardiology

## 2017-12-25 ENCOUNTER — Encounter (HOSPITAL_COMMUNITY): Payer: Medicare Other

## 2017-12-25 DIAGNOSIS — Z9889 Other specified postprocedural states: Secondary | ICD-10-CM

## 2017-12-25 DIAGNOSIS — I341 Nonrheumatic mitral (valve) prolapse: Secondary | ICD-10-CM | POA: Diagnosis not present

## 2017-12-27 ENCOUNTER — Encounter (HOSPITAL_COMMUNITY)
Admission: RE | Admit: 2017-12-27 | Discharge: 2017-12-27 | Disposition: A | Discharge status: Home / Self Care | Payer: Medicare Other | Source: Ambulatory Visit | Attending: Cardiology | Admitting: Cardiology

## 2017-12-27 ENCOUNTER — Encounter (HOSPITAL_COMMUNITY): Payer: Medicare Other

## 2017-12-27 DIAGNOSIS — I341 Nonrheumatic mitral (valve) prolapse: Secondary | ICD-10-CM | POA: Diagnosis not present

## 2017-12-27 DIAGNOSIS — Z9889 Other specified postprocedural states: Secondary | ICD-10-CM

## 2017-12-30 ENCOUNTER — Encounter (HOSPITAL_COMMUNITY)
Admission: RE | Admit: 2017-12-30 | Discharge: 2017-12-30 | Disposition: A | Discharge status: Home / Self Care | Payer: Medicare Other | Source: Ambulatory Visit | Attending: Cardiology | Admitting: Cardiology

## 2017-12-30 ENCOUNTER — Encounter (HOSPITAL_COMMUNITY): Payer: Medicare Other

## 2017-12-30 DIAGNOSIS — Z9889 Other specified postprocedural states: Secondary | ICD-10-CM

## 2017-12-30 DIAGNOSIS — I341 Nonrheumatic mitral (valve) prolapse: Secondary | ICD-10-CM | POA: Diagnosis not present

## 2018-01-01 ENCOUNTER — Encounter (HOSPITAL_COMMUNITY): Payer: Medicare Other

## 2018-01-01 ENCOUNTER — Encounter (HOSPITAL_COMMUNITY)
Admission: RE | Admit: 2018-01-01 | Discharge: 2018-01-01 | Disposition: A | Discharge status: Home / Self Care | Payer: Medicare Other | Source: Ambulatory Visit | Attending: Cardiology | Admitting: Cardiology

## 2018-01-01 DIAGNOSIS — I341 Nonrheumatic mitral (valve) prolapse: Secondary | ICD-10-CM | POA: Diagnosis not present

## 2018-01-01 DIAGNOSIS — Z9889 Other specified postprocedural states: Secondary | ICD-10-CM

## 2018-01-03 ENCOUNTER — Encounter (HOSPITAL_COMMUNITY): Payer: Medicare Other

## 2018-01-03 ENCOUNTER — Telehealth (HOSPITAL_COMMUNITY): Payer: Self-pay | Admitting: Family Medicine

## 2018-01-06 ENCOUNTER — Encounter (HOSPITAL_COMMUNITY): Payer: Medicare Other

## 2018-01-06 ENCOUNTER — Encounter (HOSPITAL_COMMUNITY)
Admission: RE | Admit: 2018-01-06 | Discharge: 2018-01-06 | Disposition: A | Discharge status: Home / Self Care | Payer: Medicare Other | Source: Ambulatory Visit | Attending: Cardiology | Admitting: Cardiology

## 2018-01-06 DIAGNOSIS — E039 Hypothyroidism, unspecified: Secondary | ICD-10-CM | POA: Diagnosis not present

## 2018-01-06 DIAGNOSIS — I480 Paroxysmal atrial fibrillation: Secondary | ICD-10-CM | POA: Insufficient documentation

## 2018-01-06 DIAGNOSIS — Z7901 Long term (current) use of anticoagulants: Secondary | ICD-10-CM | POA: Diagnosis not present

## 2018-01-06 DIAGNOSIS — K589 Irritable bowel syndrome without diarrhea: Secondary | ICD-10-CM | POA: Diagnosis not present

## 2018-01-06 DIAGNOSIS — Z9889 Other specified postprocedural states: Secondary | ICD-10-CM | POA: Diagnosis present

## 2018-01-06 DIAGNOSIS — I341 Nonrheumatic mitral (valve) prolapse: Secondary | ICD-10-CM | POA: Diagnosis not present

## 2018-01-06 DIAGNOSIS — N183 Chronic kidney disease, stage 3 (moderate): Secondary | ICD-10-CM | POA: Diagnosis not present

## 2018-01-06 DIAGNOSIS — I129 Hypertensive chronic kidney disease with stage 1 through stage 4 chronic kidney disease, or unspecified chronic kidney disease: Secondary | ICD-10-CM | POA: Insufficient documentation

## 2018-01-06 DIAGNOSIS — E785 Hyperlipidemia, unspecified: Secondary | ICD-10-CM | POA: Insufficient documentation

## 2018-01-06 DIAGNOSIS — Z950 Presence of cardiac pacemaker: Secondary | ICD-10-CM | POA: Insufficient documentation

## 2018-01-06 DIAGNOSIS — Z79899 Other long term (current) drug therapy: Secondary | ICD-10-CM | POA: Diagnosis not present

## 2018-01-06 DIAGNOSIS — Z79890 Hormone replacement therapy (postmenopausal): Secondary | ICD-10-CM | POA: Insufficient documentation

## 2018-01-06 DIAGNOSIS — Z8679 Personal history of other diseases of the circulatory system: Secondary | ICD-10-CM | POA: Diagnosis present

## 2018-01-06 DIAGNOSIS — Z8673 Personal history of transient ischemic attack (TIA), and cerebral infarction without residual deficits: Secondary | ICD-10-CM | POA: Diagnosis not present

## 2018-01-07 ENCOUNTER — Ambulatory Visit (INDEPENDENT_AMBULATORY_CARE_PROVIDER_SITE_OTHER): Payer: Medicare Other | Admitting: *Deleted

## 2018-01-07 DIAGNOSIS — Z5181 Encounter for therapeutic drug level monitoring: Secondary | ICD-10-CM

## 2018-01-07 DIAGNOSIS — I480 Paroxysmal atrial fibrillation: Secondary | ICD-10-CM | POA: Diagnosis not present

## 2018-01-07 DIAGNOSIS — Z9889 Other specified postprocedural states: Secondary | ICD-10-CM | POA: Diagnosis not present

## 2018-01-07 DIAGNOSIS — Z8679 Personal history of other diseases of the circulatory system: Secondary | ICD-10-CM | POA: Diagnosis not present

## 2018-01-07 DIAGNOSIS — Z8673 Personal history of transient ischemic attack (TIA), and cerebral infarction without residual deficits: Secondary | ICD-10-CM | POA: Diagnosis not present

## 2018-01-07 DIAGNOSIS — Z7901 Long term (current) use of anticoagulants: Secondary | ICD-10-CM

## 2018-01-07 LAB — POCT INR: INR: 2.6 (ref 2.0–3.0)

## 2018-01-07 NOTE — Patient Instructions (Signed)
Description   Continue same dose of coumadin 1 tablet daily except 2 tablets on Sundays.  Recheck INR in 6 weeks. Call us with medication changes or concerns, Main # [phone]. [facility] # [phone].

## 2018-01-08 ENCOUNTER — Encounter (HOSPITAL_COMMUNITY): Payer: Medicare Other

## 2018-01-10 ENCOUNTER — Encounter (HOSPITAL_COMMUNITY): Payer: Medicare Other

## 2018-01-13 ENCOUNTER — Encounter: Payer: Self-pay | Admitting: Cardiology

## 2018-01-13 ENCOUNTER — Encounter (HOSPITAL_COMMUNITY)
Admission: RE | Admit: 2018-01-13 | Discharge: 2018-01-13 | Disposition: A | Discharge status: Home / Self Care | Payer: Medicare Other | Source: Ambulatory Visit | Attending: Cardiology | Admitting: Cardiology

## 2018-01-13 ENCOUNTER — Encounter (HOSPITAL_COMMUNITY): Payer: Medicare Other

## 2018-01-13 DIAGNOSIS — Z9889 Other specified postprocedural states: Secondary | ICD-10-CM

## 2018-01-13 DIAGNOSIS — I341 Nonrheumatic mitral (valve) prolapse: Secondary | ICD-10-CM | POA: Diagnosis not present

## 2018-01-15 ENCOUNTER — Encounter (HOSPITAL_COMMUNITY)
Admission: RE | Admit: 2018-01-15 | Discharge: 2018-01-15 | Disposition: A | Discharge status: Home / Self Care | Payer: Medicare Other | Source: Ambulatory Visit | Attending: Cardiology | Admitting: Cardiology

## 2018-01-15 ENCOUNTER — Encounter (HOSPITAL_COMMUNITY): Payer: Medicare Other

## 2018-01-15 ENCOUNTER — Encounter (HOSPITAL_COMMUNITY): Payer: Self-pay

## 2018-01-15 DIAGNOSIS — I341 Nonrheumatic mitral (valve) prolapse: Secondary | ICD-10-CM | POA: Diagnosis not present

## 2018-01-15 DIAGNOSIS — Z9889 Other specified postprocedural states: Secondary | ICD-10-CM

## 2018-01-16 NOTE — Progress Notes (Signed)
Cardiac Individual Treatment Plan  Patient Details  Name: Amanda [name] MRN: [id number] Date of Birth: Dec 03, 1940 Referring Provider:     CARDIAC REHAB PHASE II ORIENTATION from 11/14/2017 in [facility] CARDIAC REHAB  Referring Provider  Quintella Reichert MD       Initial Encounter Date:    CARDIAC REHAB PHASE II ORIENTATION from 11/14/2017 in [facility] CARDIAC REHAB  Date  11/14/17      Visit Diagnosis: S/P MVR (mitral valve repair)  Patient's Home Medications on Admission:  Current Outpatient Medications:  .  acetaminophen (TYLENOL) 500 MG tablet, Take 500 mg by mouth every 6 (six) hours as needed. , Disp: , Rfl:  .  aMILoride (MIDAMOR) 5 MG tablet, Take 5 mg by mouth daily., Disp: , Rfl:  .  calcitRIOL (ROCALTROL) 0.25 MCG capsule, Take 0.25 mcg by mouth every 3 (three) days. In the morning , Disp: , Rfl:  .  conjugated estrogens (PREMARIN) vaginal cream, Place 1 Applicatorful vaginally at bedtime. , Disp: , Rfl:  .  fexofenadine (ALLEGRA) 180 MG tablet, Take 180 mg by mouth daily., Disp: , Rfl:  .  fluticasone (FLONASE) 50 MCG/ACT nasal spray, Place 2 sprays into both nostrils daily. , Disp: , Rfl:  .  levothyroxine (SYNTHROID, LEVOTHROID) 75 MCG tablet, Take 1 tablet (75 mcg total) by mouth See admin instructions. Take 75 mcg by mouth daily around 0300 (Patient taking differently: Take 75 mcg by mouth daily at 2 am. (0300)), Disp: 30 tablet, Rfl: 0 .  metoprolol tartrate (LOPRESSOR) 100 MG tablet, Take 1 tablet (100 mg total) 2 (two) times daily by mouth., Disp: 180 tablet, Rfl: 3 .  naproxen sodium (ALEVE) 220 MG tablet, Take 220 mg by mouth daily as needed (for severe back pain.)., Disp: , Rfl:  .  Probiotic Product (PROBIOTIC DAILY PO), Take 1 capsule by mouth 2 (two) times a week. In the morning, Disp: , Rfl:  .  vitamin B-12 (CYANOCOBALAMIN) 1000 MCG tablet, Take 1,000 mcg by mouth once a week., Disp: , Rfl:  .  warfarin (COUMADIN) 1 MG  tablet, Take 1 tablet (1 mg total) by mouth daily. (Patient taking differently: Take 1 mg by mouth every evening. ), Disp: 30 tablet, Rfl: 11  Past Medical History: Past Medical History:  Diagnosis Date  . Allergic rhinitis   . Anemia   . Asymptomatic LV dysfunction    EF 45-50% echo 07/2017  . CKD (chronic kidney disease), stage III (HCC)    stage III  . DJD (degenerative joint disease)   . Glomerulonephritis    Dr [name]  . Hyperlipidemia   . Hyperparathyroidism (HCC)   . Hypertension   . Hypothyroidism   . IBS (irritable bowel syndrome)   . Interstitial cystitis   . Mitral regurgitation   . Mobitz II    a. s/p STJ dual chamber PPM   . MVP (mitral valve prolapse)    moderate posterior MVP with moderate MR and grade II diasotlic dysfunction  . PAC (premature atrial contraction)   . PAF (paroxysmal atrial fibrillation) (HCC)     note on pacer check. CHADS2VASC score is 4 now on Eliquis.  [name] PVC's (premature ventricular contractions)   . S/P minimally invasive maze operation for atrial fibrillation 04/16/2017   Complete bilateral atrial lesion set using cryothermy and bipolar radiofrequency ablation with clipping of LA appendage via right mini thoracotomy approach  . S/P minimally invasive mitral valve repair 04/16/2017   Complex valvuloplasty  including triangular resection of posterior leaflet, artificial Gore-tex neochords x6 and Sorin Memo 3D ring annuloplasty ([id number], size 32, serial # [id number])  . S/P placement of cardiac pacemaker   . Small vessel disease, cerebrovascular   . Stroke [facility])    a. old stroke seen on imaging.    Tobacco Use: Social History   Tobacco Use  Smoking Status Former Smoker  Smokeless Tobacco Never Used    Labs: Recent Hydrographic surveyor    Labs for ITP Cardiac and Pulmonary Rehab Latest Ref Rng & Units 04/17/2017 04/18/2017 04/19/2017 04/20/2017 04/22/2017   Cholestrol 0 - 200 mg/dL - - - - -   LDLCALC 0 - 99 mg/dL - - - - -   HDL >16  mg/dL - - - - -   Trlycerides <150 mg/dL - - - 109 604(V)   Hemoglobin A1c 4.8 - 5.6 % - - - - -   PHART 7.350 - 7.450 7.334(L) 7.373 7.520(H) - -   PCO2ART 32.0 - 48.0 mmHg 38.1 40.6 37.9 - -   HCO3 20.0 - 28.0 mmol/L 20.4 23.3 30.8(H) - -   TCO2 22 - 32 mmol/L 22 - - - -   ACIDBASEDEF 0.0 - 2.0 mmol/L 5.0(H) 1.3 - - -   O2SAT % 94.0 91.0 94.5 - -      Capillary Blood Glucose: Lab Results  Component Value Date   GLUCAP 106 (H) 04/26/2017   GLUCAP 108 (H) 04/26/2017   GLUCAP 54 (L) 04/26/2017   GLUCAP 87 04/26/2017   GLUCAP 141 (H) 04/25/2017     Exercise Target Goals:    Exercise Program Goal: Individual exercise prescription set using results from initial 6 min walk test and THRR while considering  patient's activity barriers and safety.   Exercise Prescription Goal: Initial exercise prescription builds to 30-45 minutes a day of aerobic activity, 2-3 days per week.  Home exercise guidelines will be given to patient during program as part of exercise prescription that the participant will acknowledge.  Activity Barriers & Risk Stratification: Activity Barriers & Cardiac Risk Stratification - 11/14/17 1106      Activity Barriers & Cardiac Risk Stratification   Activity Barriers  Muscular Weakness;Deconditioning;Other (comment);Balance Concerns    Comments  neck stiffness and B hip pain    Cardiac Risk Stratification  High       6 Minute Walk: 6 Minute Walk    Row Name 11/14/17 1105         6 Minute Walk   Phase  Initial     Distance  1200 feet     Walk Time  6 minutes     # of Rest Breaks  0     MPH  2.27     METS  2.49     RPE  11     Perceived Dyspnea   0     VO2 Peak  8.71     Symptoms  No     Resting HR  62 bpm     Resting BP  124/75     Resting Oxygen Saturation   99 %     Exercise Oxygen Saturation  during 6 min walk  100 %     Max Ex. HR  81 bpm     Max Ex. BP  134/75     2 Minute Post BP  123/73        Oxygen Initial Assessment:   Oxygen  Re-Evaluation:   Oxygen Discharge (Final Oxygen Re-Evaluation):  Initial Exercise Prescription: Initial Exercise Prescription - 11/14/17 1100      Date of Initial Exercise RX and Referring Provider   Date  11/14/17    Referring Provider  Quintella Reichert MD       Recumbant Bike   Level  1.5    Watts  10    Minutes  10    METs  2.52      NuStep   Level  2    SPM  70    Minutes  10    METs  2      Track   Laps  9    Minutes  10    METs  2.53      Prescription Details   Frequency (times per week)  3x    Duration  Progress to 30 minutes of continuous aerobic without signs/symptoms of physical distress      Intensity   THRR 40-80% of Max Heartrate  57-114    Ratings of Perceived Exertion  11-13    Perceived Dyspnea  0-4      Progression   Progression  Continue progressive overload as per policy without signs/symptoms or physical distress.      Resistance Training   Training Prescription  Yes    Weight  2lbs    Reps  10-15       Perform Capillary Blood Glucose checks as needed.  Exercise Prescription Changes: Exercise Prescription Changes    Row Name 11/18/17 1150 12/09/17 1353 12/16/17 1600 12/30/17 1700 01/15/18 1455     Response to Exercise   Blood Pressure (Admit)  118/70  104/60  114/80  104/70  100/58   Blood Pressure (Exercise)  124/68  130/64  110/62  110/64  124/60   Blood Pressure (Exit)  104/60  102/60  96/64  92/60  108/56   Heart Rate (Admit)  70 bpm  57 bpm  87 bpm  92 bpm  74 bpm   Heart Rate (Exercise)  86 bpm  101 bpm  93 bpm  106 bpm  79 bpm   Heart Rate (Exit)  74 bpm  57 bpm  87 bpm  92 bpm  65 bpm   Rating of Perceived Exertion (Exercise)  12  13  15  11  12    Perceived Dyspnea (Exercise)  0  0  0  0  0   Symptoms  None  None  None  None  None    Comments  Pt oriented to exercise equipment  Arm Crank added to prescription due to leg pain on bike    -  -  -   Duration  Progress to 30 minutes of  aerobic without signs/symptoms of physical  distress  Progress to 30 minutes of  aerobic without signs/symptoms of physical distress  Continue with 30 min of aerobic exercise without signs/symptoms of physical distress.  Continue with 30 min of aerobic exercise without signs/symptoms of physical distress.  Continue with 30 min of aerobic exercise without signs/symptoms of physical distress.   Intensity  THRR New  THRR unchanged  THRR unchanged  THRR unchanged  THRR unchanged     Progression   Progression  Continue to progress workloads to maintain intensity without signs/symptoms of physical distress.  Continue to progress workloads to maintain intensity without signs/symptoms of physical distress.  Continue to progress workloads to maintain intensity without signs/symptoms of physical distress.  Continue to progress workloads to maintain intensity without signs/symptoms of physical distress.  Continue  to progress workloads to maintain intensity without signs/symptoms of physical distress.   Average METs  2.29  2.4  3.04  3.2  2.6     Resistance Training   Training Prescription  Yes  Yes  Yes  Yes  No   Weight  3lbs   3lbs   3lbs  3lbs  -   Reps  10-15  10-15  10-15  10-15  -   Time  10 Minutes  -  10 Minutes  10 Minutes  -     Interval Training   Interval Training  No  No  No  No  No     Recumbant Bike   Level  1.5  -  -  -  -   Watts  10  -  -  -  -   Minutes  10  -  -  -  -   METs  2  -  -  -  -     NuStep   Level  2  2  2  3  3    SPM  80  85  85  95  100   Minutes  10  10  10  10  15    METs  2  2.3  2.2  2.6  2.2     Arm Ergometer   Level  -  1  1  1   -   Watts  -  25  25  35  -   Minutes  -  10  10  10   -   METs  -  3.13  3.13  3.12  -     Track   Laps  9  15  16  16  15    Minutes  10  10  10  10  15    METs  2.5  3.09  3.79  3.79  3     Home Exercise Plan   Plans to continue exercise at  -  -  -  Home (comment) Walking  Home (comment) Walking   Frequency  -  -  -  Add 2 additional days to program exercise  sessions.  Add 2 additional days to program exercise sessions.   Initial Home Exercises Provided  -  -  -  12/18/17  12/18/17      Exercise Comments: Exercise Comments    Row Name 11/21/17 1155 12/18/17 1352 01/16/18 1457       Exercise Comments  Pt is off to a good start with exercise. Pt responded well to first two exercise sessions. Will continue to monitor and progress pt as tolerated.   Reviewed Home Exercise Program with pt. Pt was very receptive to information. Will continue to monitor and progres pt as tolerated.   Pt is still responding well to exercise prescription. Pt is able to exercise 30 minutes with no difficulty. Will continue to monitor and progress pt as toelerated.         Exercise Goals and Review: Exercise Goals    Row Name 11/14/17 0948             Exercise Goals   Increase Physical Activity  Yes       Intervention  Provide advice, education, support and counseling about physical activity/exercise needs.;Develop an individualized exercise prescription for aerobic and resistive training based on initial evaluation findings, risk stratification, comorbidities and participant's personal goals.       Expected Outcomes  Short Term: Attend rehab on  a regular basis to increase amount of physical activity.;Long Term: Add in home exercise to make exercise part of routine and to increase amount of physical activity.;Long Term: Exercising regularly at least 3-5 days a week.       Increase Strength and Stamina  Yes return to gardening and yardwork       Intervention  Provide advice, education, support and counseling about physical activity/exercise needs.;Develop an individualized exercise prescription for aerobic and resistive training based on initial evaluation findings, risk stratification, comorbidities and participant's personal goals.       Expected Outcomes  Short Term: Increase workloads from initial exercise prescription for resistance, speed, and METs.;Short Term:  Perform resistance training exercises routinely during rehab and add in resistance training at home;Long Term: Improve cardiorespiratory fitness, muscular endurance and strength as measured by increased METs and functional capacity ( )       Able to understand and use rate of perceived exertion (RPE) scale  Yes       Intervention  Provide education and explanation on how to use RPE scale       Expected Outcomes  Short Term: Able to use RPE daily in rehab to express subjective intensity level;Long Term:  Able to use RPE to guide intensity level when exercising independently       Intervention  Provide education and explanation on how to use Dyspnea scale       Expected Outcomes  Short Term: Able to use Dyspnea scale daily in rehab to express subjective sense of shortness of breath during exertion;Long Term: Able to use Dyspnea scale to guide intensity level when exercising independently       Knowledge and understanding of Target Heart Rate Range (THRR)  Yes       Intervention  Provide education and explanation of THRR including how the numbers were predicted and where they are located for reference       Expected Outcomes  Short Term: Able to state/look up THRR;Long Term: Able to use THRR to govern intensity when exercising independently;Short Term: Able to use daily as guideline for intensity in rehab       Able to check pulse independently  Yes       Intervention  Provide education and demonstration on how to check pulse in carotid and radial arteries.;Review the importance of being able to check your own pulse for safety during independent exercise       Expected Outcomes  Short Term: Able to explain why pulse checking is important during independent exercise;Long Term: Able to check pulse independently and accurately       Understanding of Exercise Prescription  Yes       Intervention  Provide education, explanation, and written materials on patient's individual exercise prescription        Expected Outcomes  Short Term: Able to explain program exercise prescription;Long Term: Able to explain home exercise prescription to exercise independently          Exercise Goals Re-Evaluation : Exercise Goals Re-Evaluation    Row Name 12/18/17 1353 01/16/18 1458           Exercise Goal Re-Evaluation   Exercise Goals Review  Increase Physical Activity;Increase Strength and Stamina;Able to understand and use rate of perceived exertion (RPE) scale;Knowledge and understanding of Target Heart Rate Range (THRR);Able to check pulse independently;Understanding of Exercise Prescription  Understanding of Exercise Prescription;Increase Physical Activity      Comments  Reviewed Home Exercise Program with pt. Also reviewed THRR, RPE  Scale, weather precautions, endpoints of exericise, NTG use, warm and cool down.   Pt is continuing to put forth effort in rehab with exercise. Will continue to work with pt to increase average METs on Nustep. Pt is becoming stronger and able to walk more laps around the track.       Expected Outcomes  Pt will walk 1-2 days a week for 15-30 minutes with husband for home exercise. Pt will continue to increase funtional capacity. Will increase pt's workloads as tolerated.   Pt will continue to walk 1-2 days at home for 15-30 minutes. Pt will continue to build strength. Will continue to monitor and progress pt as tolerated.           Discharge Exercise Prescription (Final Exercise Prescription Changes): Exercise Prescription Changes - 01/15/18 1455      Response to Exercise   Blood Pressure (Admit)  100/58    Blood Pressure (Exercise)  124/60    Blood Pressure (Exit)  108/56    Heart Rate (Admit)  74 bpm    Heart Rate (Exercise)  79 bpm    Heart Rate (Exit)  65 bpm    Rating of Perceived Exertion (Exercise)  12    Perceived Dyspnea (Exercise)  0    Symptoms  None     Duration  Continue with 30 min of aerobic exercise without signs/symptoms of physical distress.     Intensity  THRR unchanged      Progression   Progression  Continue to progress workloads to maintain intensity without signs/symptoms of physical distress.    Average METs  2.6      Resistance Training   Training Prescription  No      Interval Training   Interval Training  No      NuStep   Level  3    SPM  100    Minutes  15    METs  2.2      Track   Laps  15    Minutes  15    METs  3      Home Exercise Plan   Plans to continue exercise at  Home (comment) Walking    Frequency  Add 2 additional days to program exercise sessions.    Initial Home Exercises Provided  12/18/17       Nutrition:  Target Goals: Understanding of nutrition guidelines, daily intake of sodium 1500mg , cholesterol 200mg , calories 30% from fat and 7% or less from saturated fats, daily to have 5 or more servings of fruits and vegetables.  Biometrics: Pre Biometrics - 11/14/17 1136      Pre Biometrics   Waist Circumference  32 inches    Hip Circumference  39 inches    Waist to Hip Ratio  0.82 %    Triceps Skinfold  20 mm    % Body Fat  33.3 %    Grip Strength  20 kg    Flexibility  0 in    Single Leg Stand  4.3 seconds        Nutrition Therapy Plan and Nutrition Goals: Nutrition Therapy & Goals - 11/14/17 1207      Nutrition Therapy   Diet  General, healthful    Drug/Food Interactions  Coumadin/Vit K      Personal Nutrition Goals   Nutrition Goal  Pt to liberalize diet to promote energy intake until appetite and loss of taste improve      Intervention Plan   Intervention  Prescribe, educate  and counsel regarding individualized specific dietary modifications aiming towards targeted core components such as weight, hypertension, lipid management, diabetes, heart failure and other comorbidities.;Nutrition handout(s) given to patient. Consistent vitamin K intake    Expected Outcomes  Short Term Goal: Understand basic principles of dietary content, such as calories, fat, sodium, cholesterol  and nutrients.;Long Term Goal: Adherence to prescribed nutrition plan.       Nutrition Assessments: Nutrition Assessments - 11/14/17 1208      MEDFICTS Scores   Pre Score  39       Nutrition Goals Re-Evaluation:   Nutrition Goals Re-Evaluation:   Nutrition Goals Discharge (Final Nutrition Goals Re-Evaluation):   Psychosocial: Target Goals: Acknowledge presence or absence of significant depression and/or stress, maximize coping skills, provide positive support system. Participant is able to verbalize types and ability to use techniques and skills needed for reducing stress and depression.  Initial Review & Psychosocial Screening: Initial Psych Review & Screening - 11/14/17 1144      Initial Review   Current issues with  None Identified      Family Dynamics   Good Support System?  Yes Pt's daughter was present at orientation.  Pt also reports that her husband is source of support.      Barriers   Psychosocial barriers to participate in program  There are no identifiable barriers or psychosocial needs.      Screening Interventions   Interventions  Encouraged to exercise       Quality of Life Scores: Quality of Life - 11/14/17 1115      Quality of Life Scores   Health/Function Pre  25.29 %    Socioeconomic Pre  30 %    Psych/Spiritual Pre  29.14 %    Family Pre  28.8 %    GLOBAL Pre  27.56 %      Scores of 19 and below usually indicate a poorer quality of life in these areas.  A difference of  2-3 points is a clinically meaningful difference.  A difference of 2-3 points in the total score of the Quality of Life Index has been associated with significant improvement in overall quality of life, self-image, physical symptoms, and general health in studies assessing change in quality of life.  PHQ-9: Recent Review Flowsheet Data    Depression screen [facility] 2/9 11/18/2017 06/03/2017   Decreased Interest 0 0   Down, Depressed, Hopeless 0 0   PHQ - 2 Score 0 0   Altered  sleeping - 0   Tired, decreased energy - 0   Change in appetite - 0   Feeling bad or failure about yourself  - 0   Trouble concentrating - 0   Moving slowly or fidgety/restless - 0   Suicidal thoughts - 0   PHQ-9 Score - 0   Difficult doing work/chores - Not difficult at all     Interpretation of Total Score  Total Score Depression Severity:  1-4 = Minimal depression, 5-9 = Mild depression, 10-14 = Moderate depression, 15-19 = Moderately severe depression, 20-27 = Severe depression   Psychosocial Evaluation and Intervention: Psychosocial Evaluation - 11/21/17 1219      Psychosocial Evaluation & Interventions   Interventions  Encouraged to exercise with the program and follow exercise prescription    Comments  No psychosocial needs identified. No interventions necesarry.    Expected Outcomes  Pt will continue to exhibit a positive outlook with good coping skills.    Continue Psychosocial Services   No Follow up  required       Psychosocial Re-Evaluation: Psychosocial Re-Evaluation    Row Name 12/19/17 0749 01/15/18 1112           Psychosocial Re-Evaluation   Current issues with  None Identified  None Identified      Comments  No psychosocial needs identified. No intervention necessary.  No psychosocial needs identified. No intervention necessary.      Expected Outcomes  Stephinie will continue to exhibit a positive outlook with good coping skills.  Sarae will continue to exhibit a positive outlook with good coping skills.      Interventions  Encouraged to attend Cardiac Rehabilitation for the exercise  Encouraged to attend Cardiac Rehabilitation for the exercise      Continue Psychosocial Services   No Follow up required  No Follow up required         Psychosocial Discharge (Final Psychosocial Re-Evaluation): Psychosocial Re-Evaluation - 01/15/18 1112      Psychosocial Re-Evaluation   Current issues with  None Identified    Comments  No psychosocial needs identified. No  intervention necessary.    Expected Outcomes  Elvenia will continue to exhibit a positive outlook with good coping skills.    Interventions  Encouraged to attend Cardiac Rehabilitation for the exercise    Continue Psychosocial Services   No Follow up required       Vocational Rehabilitation: Provide vocational rehab assistance to qualifying candidates.   Vocational Rehab Evaluation & Intervention: Vocational Rehab - 11/14/17 1140      Initial Vocational Rehab Evaluation & Intervention   Assessment shows need for Vocational Rehabilitation  No       Education: Education Goals: Education classes will be provided on a weekly basis, covering required topics. Participant will state understanding/return demonstration of topics presented.  Learning Barriers/Preferences: Learning Barriers/Preferences - 11/14/17 0947      Learning Barriers/Preferences   Learning Barriers  Sight cataracts/readers    Learning Preferences  Video;Verbal Instruction;Skilled Demonstration;Pictoral;Written Material       Education Topics: Count Your Pulse:  -Group instruction provided by verbal instruction, demonstration, patient participation and written materials to support subject.  Instructors address importance of being able to find your pulse and how to count your pulse when at home without a heart monitor.  Patients get hands on experience counting their pulse with staff help and individually.   CARDIAC REHAB PHASE II EXERCISE from 01/15/2018 in [facility] CARDIAC REHAB  Date  11/22/17  Instruction Review Code  1- Verbalizes Understanding      Heart Attack, Angina, and Risk Factor Modification:  -Group instruction provided by verbal instruction, video, and written materials to support subject.  Instructors address signs and symptoms of angina and heart attacks.    Also discuss risk factors for heart disease and how to make changes to improve heart health risk factors.   CARDIAC REHAB  PHASE II EXERCISE from 01/15/2018 in [facility] CARDIAC REHAB  Date  12/25/17  Instruction Review Code  2- Demonstrated Understanding      Functional Fitness:  -Group instruction provided by verbal instruction, demonstration, patient participation, and written materials to support subject.  Instructors address safety measures for doing things around the house.  Discuss how to get up and down off the floor, how to pick things up properly, how to safely get out of a chair without assistance, and balance training.   CARDIAC REHAB PHASE II EXERCISE from 01/15/2018 in [facility] CARDIAC [facility]  Date  11/27/17  Instruction Review Code  1- Herbalist and Mindfulness:  -Group instruction provided by verbal instruction, patient participation, and written materials to support subject.  Instructor addresses importance of mindfulness and meditation practice to help reduce stress and improve awareness.  Instructor also leads participants through a meditation exercise.    CARDIAC REHAB PHASE II EXERCISE from 01/15/2018 in [facility] CARDIAC REHAB  Date  11/20/17  Instruction Review Code  1- Verbalizes Understanding      Stretching for Flexibility and Mobility:  -Group instruction provided by verbal instruction, patient participation, and written materials to support subject.  Instructors lead participants through series of stretches that are designed to increase flexibility thus improving mobility.  These stretches are additional exercise for major muscle groups that are typically performed during regular warm up and cool down.   Hands Only CPR:  -Group verbal, video, and participation provides a basic overview of AHA guidelines for community CPR. Role-play of emergencies allow participants the opportunity to practice calling for help and chest compression technique with discussion of AED use.   Hypertension: -Group  verbal and written instruction that provides a basic overview of hypertension including the most recent diagnostic guidelines, risk factor reduction with self-care instructions and medication management.   CARDIAC REHAB PHASE II EXERCISE from 01/15/2018 in [facility] CARDIAC REHAB  Date  12/20/17  Educator  RN  Instruction Review Code  2- Demonstrated Understanding       Nutrition I class: Heart Healthy Eating:  -Group instruction provided by PowerPoint slides, verbal discussion, and written materials to support subject matter. The instructor gives an explanation and review of the Therapeutic Lifestyle Changes diet recommendations, which includes a discussion on lipid goals, dietary fat, sodium, fiber, plant stanol/sterol esters, sugar, and the components of a well-balanced, healthy diet.   CARDIAC REHAB PHASE II EXERCISE from 01/15/2018 in [facility] CARDIAC REHAB  Date  11/14/17  Educator  RD      Nutrition II class: Lifestyle Skills:  -Group instruction provided by PowerPoint slides, verbal discussion, and written materials to support subject matter. The instructor gives an explanation and review of label reading, grocery shopping for heart health, heart healthy recipe modifications, and ways to make healthier choices when eating out.   CARDIAC REHAB PHASE II EXERCISE from 01/15/2018 in [facility] CARDIAC REHAB  Date  11/14/17  Educator  RD      Diabetes Question & Answer:  -Group instruction provided by PowerPoint slides, verbal discussion, and written materials to support subject matter. The instructor gives an explanation and review of diabetes co-morbidities, pre- and post-prandial blood glucose goals, pre-exercise blood glucose goals, signs, symptoms, and treatment of hypoglycemia and hyperglycemia, and foot care basics.   CARDIAC REHAB PHASE II EXERCISE from 01/15/2018 in [facility] CARDIAC REHAB  Date  01/01/18   Educator  RD  Instruction Review Code  2- Demonstrated Understanding      Diabetes Blitz:  -Group instruction provided by PowerPoint slides, verbal discussion, and written materials to support subject matter. The instructor gives an explanation and review of the physiology behind type 1 and type 2 diabetes, diabetes medications and rational behind using different medications, pre- and post-prandial blood glucose recommendations and Hemoglobin A1c goals, diabetes diet, and exercise including blood glucose guidelines for exercising safely.    Portion Distortion:  -Group instruction provided by PowerPoint slides, verbal discussion, written materials, and food  models to support subject matter. The instructor gives an explanation of serving size versus portion size, changes in portions sizes over the last 20 years, and what consists of a serving from each food group.   Stress Management:  -Group instruction provided by verbal instruction, video, and written materials to support subject matter.  Instructors review role of stress in heart disease and how to cope with stress positively.     Exercising on Your Own:  -Group instruction provided by verbal instruction, power point, and written materials to support subject.  Instructors discuss benefits of exercise, components of exercise, frequency and intensity of exercise, and end points for exercise.  Also discuss use of nitroglycerin and activating EMS.  Review options of places to exercise outside of rehab.  Review guidelines for sex with heart disease.   Cardiac Drugs I:  -Group instruction provided by verbal instruction and written materials to support subject.  Instructor reviews cardiac drug classes: antiplatelets, anticoagulants, beta blockers, and statins.  Instructor discusses reasons, side effects, and lifestyle considerations for each drug class.   CARDIAC REHAB PHASE II EXERCISE from 01/15/2018 in [facility] CARDIAC REHAB   Date  01/15/18  Instruction Review Code  2- Demonstrated Understanding      Cardiac Drugs II:  -Group instruction provided by verbal instruction and written materials to support subject.  Instructor reviews cardiac drug classes: angiotensin converting enzyme inhibitors (ACE-I), angiotensin II receptor blockers (ARBs), nitrates, and calcium channel blockers.  Instructor discusses reasons, side effects, and lifestyle considerations for each drug class.   Anatomy and Physiology of the Circulatory System:  Group verbal and written instruction and models provide basic cardiac anatomy and physiology, with the coronary electrical and arterial systems. Review of: AMI, Angina, Valve disease, Heart Failure, Peripheral Artery Disease, Cardiac Arrhythmia, Pacemakers, and the ICD.   CARDIAC REHAB PHASE II EXERCISE from 01/15/2018 in [facility] CARDIAC REHAB  Date  12/11/17  Instruction Review Code  2- Demonstrated Understanding      Other Education:  -Group or individual verbal, written, or video instructions that support the educational goals of the cardiac rehab program.   Holiday Eating Survival Tips:  -Group instruction provided by PowerPoint slides, verbal discussion, and written materials to support subject matter. The instructor gives patients tips, tricks, and techniques to help them not only survive but enjoy the holidays despite the onslaught of food that accompanies the holidays.   Knowledge Questionnaire Score: Knowledge Questionnaire Score - 11/14/17 1115      Knowledge Questionnaire Score   Pre Score  21/24       Core Components/Risk Factors/Patient Goals at Admission: Personal Goals and Risk Factors at Admission - 11/14/17 1116      Core Components/Risk Factors/Patient Goals on Admission    Weight Management  Yes       Core Components/Risk Factors/Patient Goals Review:  Goals and Risk Factor Review    Row Name 11/21/17 1218 12/19/17 0745 01/15/18 1111          Core Components/Risk Factors/Patient Goals Review   Personal Goals Review  Weight Management/Obesity  Weight Management/Obesity  Weight Management/Obesity     Review  Pt with few CAD RF eager to participate in CR Program.  Alie is off to a good start with exercise so far. Pt requesting to speak to pharmacist about medication concerns.  Consult placed to facilitate this.   Pt with few CAD RF eager to participate in CR Program.  Lenka spoke with a pharmacist  regarding medication concerns.  Keryl feels as if she is stronger and has more energy.  Stepfanie stated that her family has noted this as well.   Pt with few CAD RF eager to participate in CR Program.  Allen has not reported any further concerns about her medications.  She continues to gain strength.     Expected Outcomes  Pt will continue to participate in exercise, nutrition, and lifestyle modification.   Pt will continue to participate in exercise, nutrition, and lifestyle modification.   Pt will continue to participate in exercise, nutrition, and lifestyle modification.         Core Components/Risk Factors/Patient Goals at Discharge (Final Review):  Goals and Risk Factor Review - 01/15/18 1111      Core Components/Risk Factors/Patient Goals Review   Personal Goals Review  Weight Management/Obesity    Review  Pt with few CAD RF eager to participate in CR Program.  Evona has not reported any further concerns about her medications.  She continues to gain strength.    Expected Outcomes  Pt will continue to participate in exercise, nutrition, and lifestyle modification.        ITP Comments: ITP Comments    Row Name 11/14/17 0943 11/21/17 1218 12/19/17 0744 01/15/18 1110     ITP Comments  Dr. Armanda Magic, Medical Director  30 Day ITP Review. Pt recently started exercise with CR Program.  Arrionna tolerated exercise well.  30 Day ITP Review. Pt continues to tolerate exercise well. Elenie feels that she is stronger and has  more energy.  30 Day ITP Review. Pt continues to tolerate exercise well. Rakayla feels that she continued increasing her strength and energy.       Comments: See ITP Comments

## 2018-01-17 ENCOUNTER — Telehealth (HOSPITAL_COMMUNITY): Payer: Self-pay | Admitting: Family Medicine

## 2018-01-17 ENCOUNTER — Encounter (HOSPITAL_COMMUNITY): Payer: Medicare Other

## 2018-01-20 ENCOUNTER — Encounter (HOSPITAL_COMMUNITY): Payer: Medicare Other

## 2018-01-20 ENCOUNTER — Encounter (HOSPITAL_COMMUNITY)
Admission: RE | Admit: 2018-01-20 | Discharge: 2018-01-20 | Disposition: A | Discharge status: Home / Self Care | Payer: Medicare Other | Source: Ambulatory Visit | Attending: Cardiology | Admitting: Cardiology

## 2018-01-20 DIAGNOSIS — Z9889 Other specified postprocedural states: Secondary | ICD-10-CM

## 2018-01-20 DIAGNOSIS — I341 Nonrheumatic mitral (valve) prolapse: Secondary | ICD-10-CM | POA: Diagnosis not present

## 2018-01-22 ENCOUNTER — Encounter (HOSPITAL_COMMUNITY): Payer: Medicare Other

## 2018-01-22 ENCOUNTER — Encounter (HOSPITAL_COMMUNITY)
Admission: RE | Admit: 2018-01-22 | Discharge: 2018-01-22 | Disposition: A | Discharge status: Home / Self Care | Payer: Medicare Other | Source: Ambulatory Visit | Attending: Cardiology | Admitting: Cardiology

## 2018-01-22 DIAGNOSIS — I341 Nonrheumatic mitral (valve) prolapse: Secondary | ICD-10-CM | POA: Diagnosis not present

## 2018-01-22 DIAGNOSIS — Z9889 Other specified postprocedural states: Secondary | ICD-10-CM

## 2018-01-24 ENCOUNTER — Encounter (HOSPITAL_COMMUNITY): Payer: Medicare Other

## 2018-01-24 ENCOUNTER — Encounter (HOSPITAL_COMMUNITY)
Admission: RE | Admit: 2018-01-24 | Discharge: 2018-01-24 | Disposition: A | Discharge status: Home / Self Care | Payer: Medicare Other | Source: Ambulatory Visit | Attending: Cardiology | Admitting: Cardiology

## 2018-01-24 DIAGNOSIS — Z9889 Other specified postprocedural states: Secondary | ICD-10-CM

## 2018-01-27 ENCOUNTER — Encounter (HOSPITAL_COMMUNITY): Payer: Medicare Other

## 2018-01-27 ENCOUNTER — Encounter (HOSPITAL_COMMUNITY)
Admission: RE | Admit: 2018-01-27 | Discharge: 2018-01-27 | Disposition: A | Discharge status: Home / Self Care | Payer: Medicare Other | Source: Ambulatory Visit | Attending: Cardiology | Admitting: Cardiology

## 2018-01-27 DIAGNOSIS — Z9889 Other specified postprocedural states: Secondary | ICD-10-CM

## 2018-01-29 ENCOUNTER — Telehealth (HOSPITAL_COMMUNITY): Payer: Self-pay | Admitting: Family Medicine

## 2018-01-29 ENCOUNTER — Encounter (HOSPITAL_COMMUNITY): Payer: Medicare Other

## 2018-01-31 ENCOUNTER — Encounter (HOSPITAL_COMMUNITY)
Admission: RE | Admit: 2018-01-31 | Discharge: 2018-01-31 | Disposition: A | Discharge status: Home / Self Care | Payer: Medicare Other | Source: Ambulatory Visit | Attending: Cardiology | Admitting: Cardiology

## 2018-01-31 ENCOUNTER — Encounter (HOSPITAL_COMMUNITY): Payer: Medicare Other

## 2018-01-31 DIAGNOSIS — Z9889 Other specified postprocedural states: Secondary | ICD-10-CM

## 2018-01-31 DIAGNOSIS — I341 Nonrheumatic mitral (valve) prolapse: Secondary | ICD-10-CM | POA: Diagnosis not present

## 2018-02-03 ENCOUNTER — Telehealth: Payer: Self-pay | Admitting: Cardiology

## 2018-02-03 ENCOUNTER — Encounter (HOSPITAL_COMMUNITY)
Admission: RE | Admit: 2018-02-03 | Discharge: 2018-02-03 | Disposition: A | Discharge status: Home / Self Care | Payer: Medicare Other | Source: Ambulatory Visit | Attending: Cardiology | Admitting: Cardiology

## 2018-02-03 ENCOUNTER — Encounter (HOSPITAL_COMMUNITY): Payer: Medicare Other

## 2018-02-03 DIAGNOSIS — Z9889 Other specified postprocedural states: Secondary | ICD-10-CM

## 2018-02-03 DIAGNOSIS — I341 Nonrheumatic mitral (valve) prolapse: Secondary | ICD-10-CM | POA: Diagnosis not present

## 2018-02-03 NOTE — Telephone Encounter (Signed)
New Message:        Pt c/o medication issue:  1. Name of Medication: metoprolol tartrate (LOPRESSOR) 100 MG tablet  2. How are you currently taking this medication (dosage and times per day)? Take 1 tablet (100 mg total) 2 (two) times daily by mouth.  3. Are you having a reaction (difficulty breathing--STAT)? No  4. What is your medication issue? Pt's daughter states the pt's BP has been kinda of low and she states this medication has been adjusted before and is wondering should it be adjusted again.

## 2018-02-03 NOTE — Telephone Encounter (Signed)
Pt daughter called to report that the pt has been having hypotension and drop in BP with rising. Her Bp with Dr. [name] on 01/28/18 was 97/63 sitting and 85/57 standing. He decreased her Metoprolol to 50mg  once a day and decreased her amiloride that he originally put her on to 2.5mg  a day from 5mg . She is still having low BP at Cardiac Rehab such as 100/60 and having to drink fluids prior to her appt and to sometimes cancel rehab. She has been very tired and weak. She denies dizziness, cp, sob, and not presyncope/syncope. Her daughter is asking if she can stop the beta blockers. She feels she is not tolerating her meds.

## 2018-02-04 NOTE — Telephone Encounter (Signed)
Per [name], it needs to be confirmed if the patient is taking lopressor or Toprol prior to changes.

## 2018-02-04 NOTE — Telephone Encounter (Signed)
Addendum to 02/03/18 phone note... Daughter would like [place] to talk with [name] about this problem. Advised her that I will forward to her and let her know of she would like to make any changes. Her next OV is not until 04/2018.

## 2018-02-05 ENCOUNTER — Telehealth: Payer: Self-pay

## 2018-02-05 ENCOUNTER — Encounter (HOSPITAL_COMMUNITY)
Admission: RE | Admit: 2018-02-05 | Discharge: 2018-02-05 | Disposition: A | Discharge status: Home / Self Care | Payer: Medicare Other | Source: Ambulatory Visit | Attending: Cardiology | Admitting: Cardiology

## 2018-02-05 ENCOUNTER — Encounter (HOSPITAL_COMMUNITY): Payer: Medicare Other

## 2018-02-05 DIAGNOSIS — I341 Nonrheumatic mitral (valve) prolapse: Secondary | ICD-10-CM | POA: Diagnosis not present

## 2018-02-05 DIAGNOSIS — Z9889 Other specified postprocedural states: Secondary | ICD-10-CM

## 2018-02-05 NOTE — Telephone Encounter (Signed)
Spoke with Dr. Deterding's assistant. She states Dr. [name] decreased metorprolol tartrate from 100 mg BID to 50 mg BID.

## 2018-02-05 NOTE — Telephone Encounter (Signed)
Correction decrease metoprolol tartrate to 25 mg twice daily and follow-up with extender in 2 weeks

## 2018-02-05 NOTE — Telephone Encounter (Signed)
Decrease metoprolol tartrate to 25 mg twice daily and follow-up with extender in 2 weeks

## 2018-02-05 NOTE — Telephone Encounter (Signed)
Erroneous

## 2018-02-06 NOTE — Telephone Encounter (Signed)
Left the pts Daughter [name] (on [place]) a message to call the office back and ask to speak with a triage nurse, to receive recommendations per Dr [name].

## 2018-02-07 ENCOUNTER — Encounter (HOSPITAL_COMMUNITY): Payer: Medicare Other

## 2018-02-07 ENCOUNTER — Encounter (HOSPITAL_COMMUNITY)
Admission: RE | Admit: 2018-02-07 | Discharge: 2018-02-07 | Disposition: A | Discharge status: Home / Self Care | Payer: Medicare Other | Source: Ambulatory Visit | Attending: Cardiology | Admitting: Cardiology

## 2018-02-07 DIAGNOSIS — Z79899 Other long term (current) drug therapy: Secondary | ICD-10-CM | POA: Diagnosis not present

## 2018-02-07 DIAGNOSIS — Z8673 Personal history of transient ischemic attack (TIA), and cerebral infarction without residual deficits: Secondary | ICD-10-CM | POA: Insufficient documentation

## 2018-02-07 DIAGNOSIS — I129 Hypertensive chronic kidney disease with stage 1 through stage 4 chronic kidney disease, or unspecified chronic kidney disease: Secondary | ICD-10-CM | POA: Insufficient documentation

## 2018-02-07 DIAGNOSIS — Z9889 Other specified postprocedural states: Secondary | ICD-10-CM | POA: Insufficient documentation

## 2018-02-07 DIAGNOSIS — Z7901 Long term (current) use of anticoagulants: Secondary | ICD-10-CM | POA: Diagnosis not present

## 2018-02-07 DIAGNOSIS — Z950 Presence of cardiac pacemaker: Secondary | ICD-10-CM | POA: Insufficient documentation

## 2018-02-07 DIAGNOSIS — Z8679 Personal history of other diseases of the circulatory system: Secondary | ICD-10-CM | POA: Diagnosis present

## 2018-02-07 DIAGNOSIS — E785 Hyperlipidemia, unspecified: Secondary | ICD-10-CM | POA: Diagnosis not present

## 2018-02-07 DIAGNOSIS — N183 Chronic kidney disease, stage 3 (moderate): Secondary | ICD-10-CM | POA: Diagnosis not present

## 2018-02-07 DIAGNOSIS — I480 Paroxysmal atrial fibrillation: Secondary | ICD-10-CM | POA: Diagnosis not present

## 2018-02-07 DIAGNOSIS — E039 Hypothyroidism, unspecified: Secondary | ICD-10-CM | POA: Insufficient documentation

## 2018-02-07 DIAGNOSIS — K589 Irritable bowel syndrome without diarrhea: Secondary | ICD-10-CM | POA: Diagnosis not present

## 2018-02-07 DIAGNOSIS — I341 Nonrheumatic mitral (valve) prolapse: Secondary | ICD-10-CM | POA: Diagnosis not present

## 2018-02-07 DIAGNOSIS — Z79890 Hormone replacement therapy (postmenopausal): Secondary | ICD-10-CM | POA: Diagnosis not present

## 2018-02-07 MED ORDER — METOPROLOL TARTRATE 25 MG PO TABS: 25.0000 mg | ORAL_TABLET | Freq: Two times a day (BID) | ORAL | 3 refills | Status: DC

## 2018-02-07 NOTE — Telephone Encounter (Signed)
Call placed to daughter [name].  Advised per [name] have Pt decrease metoprolol tartrate to 25 mg BID. Pt currently taking 50 mg tartrate in the AM only.  Made appt with Leda Gauze first available 03/11/2018 at 9:30 am.    Advised daughter to call office if Pt continues to have low BP.  Daughter indicates understanding.  Thanked nurse for call.

## 2018-02-07 NOTE — Addendum Note (Signed)
Addended by: Roney Mans A on: 02/07/2018 09:13 AM   Modules accepted: Orders

## 2018-02-10 ENCOUNTER — Encounter (HOSPITAL_COMMUNITY): Payer: Medicare Other

## 2018-02-10 ENCOUNTER — Encounter (HOSPITAL_COMMUNITY)
Admission: RE | Admit: 2018-02-10 | Discharge: 2018-02-10 | Disposition: A | Discharge status: Home / Self Care | Payer: Medicare Other | Source: Ambulatory Visit | Attending: Cardiology | Admitting: Cardiology

## 2018-02-10 DIAGNOSIS — I341 Nonrheumatic mitral (valve) prolapse: Secondary | ICD-10-CM | POA: Diagnosis not present

## 2018-02-10 DIAGNOSIS — Z9889 Other specified postprocedural states: Secondary | ICD-10-CM

## 2018-02-12 ENCOUNTER — Encounter (HOSPITAL_COMMUNITY): Payer: Self-pay

## 2018-02-12 ENCOUNTER — Encounter (HOSPITAL_COMMUNITY): Payer: Medicare Other

## 2018-02-12 ENCOUNTER — Encounter (HOSPITAL_COMMUNITY)
Admission: RE | Admit: 2018-02-12 | Discharge: 2018-02-12 | Disposition: A | Discharge status: Home / Self Care | Payer: Medicare Other | Source: Ambulatory Visit | Attending: Cardiology | Admitting: Cardiology

## 2018-02-12 DIAGNOSIS — I341 Nonrheumatic mitral (valve) prolapse: Secondary | ICD-10-CM | POA: Diagnosis not present

## 2018-02-12 DIAGNOSIS — Z9889 Other specified postprocedural states: Secondary | ICD-10-CM

## 2018-02-13 NOTE — Progress Notes (Signed)
Cardiac Individual Treatment Plan  Patient Details  Name: Amanda [name] MRN: [id number] Date of Birth: 1941-04-16 Referring Provider:     CARDIAC REHAB PHASE II ORIENTATION from 11/14/2017 in [facility] CARDIAC REHAB  Referring Provider  Quintella Reichert MD       Initial Encounter Date:    CARDIAC REHAB PHASE II ORIENTATION from 11/14/2017 in [facility] CARDIAC REHAB  Date  11/14/17      Visit Diagnosis: S/P MVR (mitral valve repair)  Patient's Home Medications on Admission:  Current Outpatient Medications:  .  acetaminophen (TYLENOL) 500 MG tablet, Take 500 mg by mouth every 6 (six) hours as needed. , Disp: , Rfl:  .  aMILoride (MIDAMOR) 5 MG tablet, Take 2.5 mg by mouth daily., Disp: , Rfl:  .  calcitRIOL (ROCALTROL) 0.25 MCG capsule, Take 0.25 mcg by mouth every 3 (three) days. In the morning , Disp: , Rfl:  .  conjugated estrogens (PREMARIN) vaginal cream, Place 1 Applicatorful vaginally at bedtime. , Disp: , Rfl:  .  fexofenadine (ALLEGRA) 180 MG tablet, Take 180 mg by mouth daily., Disp: , Rfl:  .  fluticasone (FLONASE) 50 MCG/ACT nasal spray, Place 2 sprays into both nostrils daily. , Disp: , Rfl:  .  levothyroxine (SYNTHROID, LEVOTHROID) 75 MCG tablet, Take 1 tablet (75 mcg total) by mouth See admin instructions. Take 75 mcg by mouth daily around 0300 (Patient taking differently: Take 75 mcg by mouth daily at 2 am. (0300)), Disp: 30 tablet, Rfl: 0 .  metoprolol tartrate (LOPRESSOR) 25 MG tablet, Take 1 tablet (25 mg total) by mouth 2 (two) times daily., Disp: 180 tablet, Rfl: 3 .  naproxen sodium (ALEVE) 220 MG tablet, Take 220 mg by mouth daily as needed (for severe back pain.)., Disp: , Rfl:  .  Probiotic Product (PROBIOTIC DAILY PO), Take 1 capsule by mouth 2 (two) times a week. In the morning, Disp: , Rfl:  .  vitamin B-12 (CYANOCOBALAMIN) 1000 MCG tablet, Take 1,000 mcg by mouth once a week., Disp: , Rfl:  .  warfarin (COUMADIN) 1 MG  tablet, Take 1 tablet (1 mg total) by mouth daily. (Patient taking differently: Take 1 mg by mouth every evening. ), Disp: 30 tablet, Rfl: 11  Past Medical History: Past Medical History:  Diagnosis Date  . Allergic rhinitis   . Anemia   . Asymptomatic LV dysfunction    EF 45-50% echo 07/2017  . CKD (chronic kidney disease), stage III (HCC)    stage III  . DJD (degenerative joint disease)   . Glomerulonephritis    Dr [name]  . Hyperlipidemia   . Hyperparathyroidism (HCC)   . Hypertension   . Hypothyroidism   . IBS (irritable bowel syndrome)   . Interstitial cystitis   . Mitral regurgitation   . Mobitz II    a. s/p STJ dual chamber PPM   . MVP (mitral valve prolapse)    moderate posterior MVP with moderate MR and grade II diasotlic dysfunction  . PAC (premature atrial contraction)   . PAF (paroxysmal atrial fibrillation) (HCC)     note on pacer check. CHADS2VASC score is 4 now on Eliquis.  [name] PVC's (premature ventricular contractions)   . S/P minimally invasive maze operation for atrial fibrillation 04/16/2017   Complete bilateral atrial lesion set using cryothermy and bipolar radiofrequency ablation with clipping of LA appendage via right mini thoracotomy approach  . S/P minimally invasive mitral valve repair 04/16/2017   Complex valvuloplasty  including triangular resection of posterior leaflet, artificial Gore-tex neochords x6 and Sorin Memo 3D ring annuloplasty ([id number], size 32, serial # [id number])  . S/P placement of cardiac pacemaker   . Small vessel disease, cerebrovascular   . Stroke [facility])    a. old stroke seen on imaging.    Tobacco Use: Social History   Tobacco Use  Smoking Status Former Smoker  Smokeless Tobacco Never Used    Labs: Recent Hydrographic surveyor    Labs for ITP Cardiac and Pulmonary Rehab Latest Ref Rng & Units 04/17/2017 04/18/2017 04/19/2017 04/20/2017 04/22/2017   Cholestrol 0 - 200 mg/dL - - - - -   LDLCALC 0 - 99 mg/dL - - - - -   HDL >16  mg/dL - - - - -   Trlycerides <150 mg/dL - - - 109 604(V)   Hemoglobin A1c 4.8 - 5.6 % - - - - -   PHART 7.350 - 7.450 7.334(L) 7.373 7.520(H) - -   PCO2ART 32.0 - 48.0 mmHg 38.1 40.6 37.9 - -   HCO3 20.0 - 28.0 mmol/L 20.4 23.3 30.8(H) - -   TCO2 22 - 32 mmol/L 22 - - - -   ACIDBASEDEF 0.0 - 2.0 mmol/L 5.0(H) 1.3 - - -   O2SAT % 94.0 91.0 94.5 - -      Capillary Blood Glucose: Lab Results  Component Value Date   GLUCAP 106 (H) 04/26/2017   GLUCAP 108 (H) 04/26/2017   GLUCAP 54 (L) 04/26/2017   GLUCAP 87 04/26/2017   GLUCAP 141 (H) 04/25/2017     Exercise Target Goals:    Exercise Program Goal: Individual exercise prescription set using results from initial 6 min walk test and THRR while considering  patient's activity barriers and safety.   Exercise Prescription Goal: Initial exercise prescription builds to 30-45 minutes a day of aerobic activity, 2-3 days per week.  Home exercise guidelines will be given to patient during program as part of exercise prescription that the participant will acknowledge.  Activity Barriers & Risk Stratification: Activity Barriers & Cardiac Risk Stratification - 11/14/17 1106      Activity Barriers & Cardiac Risk Stratification   Activity Barriers  Muscular Weakness;Deconditioning;Other (comment);Balance Concerns    Comments  neck stiffness and B hip pain    Cardiac Risk Stratification  High       6 Minute Walk: 6 Minute Walk    Row Name 11/14/17 1105         6 Minute Walk   Phase  Initial     Distance  1200 feet     Walk Time  6 minutes     # of Rest Breaks  0     MPH  2.27     METS  2.49     RPE  11     Perceived Dyspnea   0     VO2 Peak  8.71     Symptoms  No     Resting HR  62 bpm     Resting BP  124/75     Resting Oxygen Saturation   99 %     Exercise Oxygen Saturation  during 6 min walk  100 %     Max Ex. HR  81 bpm     Max Ex. BP  134/75     2 Minute Post BP  123/73        Oxygen Initial Assessment:   Oxygen  Re-Evaluation:   Oxygen Discharge (Final Oxygen Re-Evaluation):  Initial Exercise Prescription: Initial Exercise Prescription - 11/14/17 1100      Date of Initial Exercise RX and Referring Provider   Date  11/14/17    Referring Provider  Quintella Reichert MD       Recumbant Bike   Level  1.5    Watts  10    Minutes  10    METs  2.52      NuStep   Level  2    SPM  70    Minutes  10    METs  2      Track   Laps  9    Minutes  10    METs  2.53      Prescription Details   Frequency (times per week)  3x    Duration  Progress to 30 minutes of continuous aerobic without signs/symptoms of physical distress      Intensity   THRR 40-80% of Max Heartrate  57-114    Ratings of Perceived Exertion  11-13    Perceived Dyspnea  0-4      Progression   Progression  Continue progressive overload as per policy without signs/symptoms or physical distress.      Resistance Training   Training Prescription  Yes    Weight  2lbs    Reps  10-15       Perform Capillary Blood Glucose checks as needed.  Exercise Prescription Changes: Exercise Prescription Changes    Row Name 11/18/17 1150 12/09/17 1353 12/16/17 1600 12/30/17 1700 01/15/18 1455     Response to Exercise   Blood Pressure (Admit)  118/70  104/60  114/80  104/70  100/58   Blood Pressure (Exercise)  124/68  130/64  110/62  110/64  124/60   Blood Pressure (Exit)  104/60  102/60  96/64  92/60  108/56   Heart Rate (Admit)  70 bpm  57 bpm  87 bpm  92 bpm  74 bpm   Heart Rate (Exercise)  86 bpm  101 bpm  93 bpm  106 bpm  79 bpm   Heart Rate (Exit)  74 bpm  57 bpm  87 bpm  92 bpm  65 bpm   Rating of Perceived Exertion (Exercise)  12  13  15  11  12    Perceived Dyspnea (Exercise)  0  0  0  0  0   Symptoms  None  None  None  None  None    Comments  Pt oriented to exercise equipment  Arm Crank added to prescription due to leg pain on bike    -  -  -   Duration  Progress to 30 minutes of  aerobic without signs/symptoms of physical  distress  Progress to 30 minutes of  aerobic without signs/symptoms of physical distress  Continue with 30 min of aerobic exercise without signs/symptoms of physical distress.  Continue with 30 min of aerobic exercise without signs/symptoms of physical distress.  Continue with 30 min of aerobic exercise without signs/symptoms of physical distress.   Intensity  THRR New  THRR unchanged  THRR unchanged  THRR unchanged  THRR unchanged     Progression   Progression  Continue to progress workloads to maintain intensity without signs/symptoms of physical distress.  Continue to progress workloads to maintain intensity without signs/symptoms of physical distress.  Continue to progress workloads to maintain intensity without signs/symptoms of physical distress.  Continue to progress workloads to maintain intensity without signs/symptoms of physical distress.  Continue  to progress workloads to maintain intensity without signs/symptoms of physical distress.   Average METs  2.29  2.4  3.04  3.2  2.6     Resistance Training   Training Prescription  Yes  Yes  Yes  Yes  No   Weight  3lbs   3lbs   3lbs  3lbs  -   Reps  10-15  10-15  10-15  10-15  -   Time  10 Minutes  -  10 Minutes  10 Minutes  -     Interval Training   Interval Training  No  No  No  No  No     Recumbant Bike   Level  1.5  -  -  -  -   Watts  10  -  -  -  -   Minutes  10  -  -  -  -   METs  2  -  -  -  -     NuStep   Level  2  2  2  3  3    SPM  80  85  85  95  100   Minutes  10  10  10  10  15    METs  2  2.3  2.2  2.6  2.2     Arm Ergometer   Level  -  1  1  1   -   Watts  -  25  25  35  -   Minutes  -  10  10  10   -   METs  -  3.13  3.13  3.12  -     Track   Laps  9  15  16  16  15    Minutes  10  10  10  10  15    METs  2.5  3.09  3.79  3.79  3     Home Exercise Plan   Plans to continue exercise at  -  -  -  Home (comment) Walking  Home (comment) Walking   Frequency  -  -  -  Add 2 additional days to program exercise  sessions.  Add 2 additional days to program exercise sessions.   Initial Home Exercises Provided  -  -  -  12/18/17  12/18/17   Row Name 02/10/18 1419             Response to Exercise   Blood Pressure (Admit)  96/60       Blood Pressure (Exercise)  104/70       Blood Pressure (Exit)  96/62       Heart Rate (Admit)  72 bpm       Heart Rate (Exercise)  97 bpm       Heart Rate (Exit)  70 bpm       Rating of Perceived Exertion (Exercise)  12       Perceived Dyspnea (Exercise)  0       Symptoms  None        Duration  Continue with 30 min of aerobic exercise without signs/symptoms of physical distress.       Intensity  THRR unchanged         Progression   Progression  Continue to progress workloads to maintain intensity without signs/symptoms of physical distress.       Average METs  3.14         Resistance Training   Training Prescription  Yes  Weight  3lbs       Reps  10-15       Time  10 Minutes         Interval Training   Interval Training  No         NuStep   Level  4       SPM  100       Minutes  15       METs  2.5         Track   Laps  16       Minutes  15       METs  3         Home Exercise Plan   Plans to continue exercise at  Home (comment) Walking       Frequency  Add 2 additional days to program exercise sessions.       Initial Home Exercises Provided  12/18/17          Exercise Comments: Exercise Comments    Row Name 11/21/17 1155 12/18/17 1352 01/16/18 1457 02/13/18 1422     Exercise Comments  Pt is off to a good start with exercise. Pt responded well to first two exercise sessions. Will continue to monitor and progress pt as tolerated.   Reviewed Home Exercise Program with pt. Pt was very receptive to information. Will continue to monitor and progres pt as tolerated.   Pt is still responding well to exercise prescription. Pt is able to exercise 30 minutes with no difficulty. Will continue to monitor and progress pt as toelerated.   Pt is  responding well to workload increases. Pt is enjoying rehab so much, pt states she is interested in maintainence program once she graduates. Will continue to progress workloads as tolerated.        Exercise Goals and Review: Exercise Goals    Row Name 11/14/17 0948             Exercise Goals   Increase Physical Activity  Yes       Intervention  Provide advice, education, support and counseling about physical activity/exercise needs.;Develop an individualized exercise prescription for aerobic and resistive training based on initial evaluation findings, risk stratification, comorbidities and participant's personal goals.       Expected Outcomes  Short Term: Attend rehab on a regular basis to increase amount of physical activity.;Long Term: Add in home exercise to make exercise part of routine and to increase amount of physical activity.;Long Term: Exercising regularly at least 3-5 days a week.       Increase Strength and Stamina  Yes return to gardening and yardwork       Intervention  Provide advice, education, support and counseling about physical activity/exercise needs.;Develop an individualized exercise prescription for aerobic and resistive training based on initial evaluation findings, risk stratification, comorbidities and participant's personal goals.       Expected Outcomes  Short Term: Increase workloads from initial exercise prescription for resistance, speed, and METs.;Short Term: Perform resistance training exercises routinely during rehab and add in resistance training at home;Long Term: Improve cardiorespiratory fitness, muscular endurance and strength as measured by increased METs and functional capacity ( )       Able to understand and use rate of perceived exertion (RPE) scale  Yes       Intervention  Provide education and explanation on how to use RPE scale       Expected Outcomes  Short Term: Able to use RPE daily in  rehab to express subjective intensity level;Long Term:   Able to use RPE to guide intensity level when exercising independently       Intervention  Provide education and explanation on how to use Dyspnea scale       Expected Outcomes  Short Term: Able to use Dyspnea scale daily in rehab to express subjective sense of shortness of breath during exertion;Long Term: Able to use Dyspnea scale to guide intensity level when exercising independently       Knowledge and understanding of Target Heart Rate Range (THRR)  Yes       Intervention  Provide education and explanation of THRR including how the numbers were predicted and where they are located for reference       Expected Outcomes  Short Term: Able to state/look up THRR;Long Term: Able to use THRR to govern intensity when exercising independently;Short Term: Able to use daily as guideline for intensity in rehab       Able to check pulse independently  Yes       Intervention  Provide education and demonstration on how to check pulse in carotid and radial arteries.;Review the importance of being able to check your own pulse for safety during independent exercise       Expected Outcomes  Short Term: Able to explain why pulse checking is important during independent exercise;Long Term: Able to check pulse independently and accurately       Understanding of Exercise Prescription  Yes       Intervention  Provide education, explanation, and written materials on patient's individual exercise prescription       Expected Outcomes  Short Term: Able to explain program exercise prescription;Long Term: Able to explain home exercise prescription to exercise independently          Exercise Goals Re-Evaluation : Exercise Goals Re-Evaluation    Row Name 12/18/17 1353 01/16/18 1458 02/13/18 1424         Exercise Goal Re-Evaluation   Exercise Goals Review  Increase Physical Activity;Increase Strength and Stamina;Able to understand and use rate of perceived exertion (RPE) scale;Knowledge and understanding of Target Heart  Rate Range (THRR);Able to check pulse independently;Understanding of Exercise Prescription  Understanding of Exercise Prescription;Increase Physical Activity  Understanding of Exercise Prescription;Increase Physical Activity     Comments  Reviewed Home Exercise Program with pt. Also reviewed THRR, RPE Scale, weather precautions, endpoints of exericise, NTG use, warm and cool down.   Pt is continuing to put forth effort in rehab with exercise. Will continue to work with pt to increase average METs on Nustep. Pt is becoming stronger and able to walk more laps around the track.   Pt is doing well with exercise. Pt is now exercising at level 4 on Nustep. Pt is continuing to increase strength and stamina.      Expected Outcomes  Pt will walk 1-2 days a week for 15-30 minutes with husband for home exercise. Pt will continue to increase funtional capacity. Will increase pt's workloads as tolerated.   Pt will continue to walk 1-2 days at home for 15-30 minutes. Pt will continue to build strength. Will continue to monitor and progress pt as tolerated.   Pt will continue to walk at home for exercise 2 days a week. Pt will continue to increase cardiorespiratory fitness. Will continue to monitor and progress pt as tolerated.          Discharge Exercise Prescription (Final Exercise Prescription Changes): Exercise Prescription Changes - 02/10/18 1419  Response to Exercise   Blood Pressure (Admit)  96/60    Blood Pressure (Exercise)  104/70    Blood Pressure (Exit)  96/62    Heart Rate (Admit)  72 bpm    Heart Rate (Exercise)  97 bpm    Heart Rate (Exit)  70 bpm    Rating of Perceived Exertion (Exercise)  12    Perceived Dyspnea (Exercise)  0    Symptoms  None     Duration  Continue with 30 min of aerobic exercise without signs/symptoms of physical distress.    Intensity  THRR unchanged      Progression   Progression  Continue to progress workloads to maintain intensity without signs/symptoms of  physical distress.    Average METs  3.14      Resistance Training   Training Prescription  Yes    Weight  3lbs    Reps  10-15    Time  10 Minutes      Interval Training   Interval Training  No      NuStep   Level  4    SPM  100    Minutes  15    METs  2.5      Track   Laps  16    Minutes  15    METs  3      Home Exercise Plan   Plans to continue exercise at  Home (comment)   Walking   Frequency  Add 2 additional days to program exercise sessions.    Initial Home Exercises Provided  12/18/17       Nutrition:  Target Goals: Understanding of nutrition guidelines, daily intake of sodium 1500mg , cholesterol 200mg , calories 30% from fat and 7% or less from saturated fats, daily to have 5 or more servings of fruits and vegetables.  Biometrics: Pre Biometrics - 11/14/17 1136      Pre Biometrics   Waist Circumference  32 inches    Hip Circumference  39 inches    Waist to Hip Ratio  0.82 %    Triceps Skinfold  20 mm    % Body Fat  33.3 %    Grip Strength  20 kg    Flexibility  0 in    Single Leg Stand  4.3 seconds        Nutrition Therapy Plan and Nutrition Goals: Nutrition Therapy & Goals - 11/14/17 1207      Nutrition Therapy   Diet  General, healthful    Drug/Food Interactions  Coumadin/Vit K      Personal Nutrition Goals   Nutrition Goal  Pt to liberalize diet to promote energy intake until appetite and loss of taste improve      Intervention Plan   Intervention  Prescribe, educate and counsel regarding individualized specific dietary modifications aiming towards targeted core components such as weight, hypertension, lipid management, diabetes, heart failure and other comorbidities.;Nutrition handout(s) given to patient.   Consistent vitamin K intake   Expected Outcomes  Short Term Goal: Understand basic principles of dietary content, such as calories, fat, sodium, cholesterol and nutrients.;Long Term Goal: Adherence to prescribed nutrition plan.        Nutrition Assessments: Nutrition Assessments - 11/14/17 1208      MEDFICTS Scores   Pre Score  39       Nutrition Goals Re-Evaluation:   Nutrition Goals Re-Evaluation:   Nutrition Goals Discharge (Final Nutrition Goals Re-Evaluation):   Psychosocial: Target Goals: Acknowledge presence or absence of significant  depression and/or stress, maximize coping skills, provide positive support system. Participant is able to verbalize types and ability to use techniques and skills needed for reducing stress and depression.  Initial Review & Psychosocial Screening: Initial Psych Review & Screening - 11/14/17 1144      Initial Review   Current issues with  None Identified      Family Dynamics   Good Support System?  Yes   Pt's daughter was present at orientation.  Pt also reports that her husband is source of support.     Barriers   Psychosocial barriers to participate in program  There are no identifiable barriers or psychosocial needs.      Screening Interventions   Interventions  Encouraged to exercise       Quality of Life Scores: Quality of Life - 11/14/17 1115      Quality of Life Scores   Health/Function Pre  25.29 %    Socioeconomic Pre  30 %    Psych/Spiritual Pre  29.14 %    Family Pre  28.8 %    GLOBAL Pre  27.56 %      Scores of 19 and below usually indicate a poorer quality of life in these areas.  A difference of  2-3 points is a clinically meaningful difference.  A difference of 2-3 points in the total score of the Quality of Life Index has been associated with significant improvement in overall quality of life, self-image, physical symptoms, and general health in studies assessing change in quality of life.  PHQ-9: Recent Review Flowsheet Data    Depression screen [facility] 2/9 11/18/2017 06/03/2017   Decreased Interest 0 0   Down, Depressed, Hopeless 0 0   PHQ - 2 Score 0 0   Altered sleeping - 0   Tired, decreased energy - 0   Change in appetite - 0    Feeling bad or failure about yourself  - 0   Trouble concentrating - 0   Moving slowly or fidgety/restless - 0   Suicidal thoughts - 0   PHQ-9 Score - 0   Difficult doing work/chores - Not difficult at all     Interpretation of Total Score  Total Score Depression Severity:  1-4 = Minimal depression, 5-9 = Mild depression, 10-14 = Moderate depression, 15-19 = Moderately severe depression, 20-27 = Severe depression   Psychosocial Evaluation and Intervention: Psychosocial Evaluation - 11/21/17 1219      Psychosocial Evaluation & Interventions   Interventions  Encouraged to exercise with the program and follow exercise prescription    Comments  No psychosocial needs identified. No interventions necesarry.    Expected Outcomes  Pt will continue to exhibit a positive outlook with good coping skills.    Continue Psychosocial Services   No Follow up required       Psychosocial Re-Evaluation: Psychosocial Re-Evaluation    Row Name 12/19/17 0749 01/15/18 1112 02/12/18 1503         Psychosocial Re-Evaluation   Current issues with  None Identified  None Identified  None Identified     Comments  No psychosocial needs identified. No intervention necessary.  No psychosocial needs identified. No intervention necessary.  No psychosocial needs identified. No intervention necessary.     Expected Outcomes  Amanda [name] will continue to exhibit a positive outlook with good coping skills.  Amanda [name] will continue to exhibit a positive outlook with good coping skills.  Amanda [name] will continue to exhibit a positive outlook with good coping skills.  Interventions  Encouraged to attend Cardiac Rehabilitation for the exercise  Encouraged to attend Cardiac Rehabilitation for the exercise  Encouraged to attend Cardiac Rehabilitation for the exercise     Continue Psychosocial Services   No Follow up required  No Follow up required  No Follow up required        Psychosocial Discharge (Final Psychosocial  Re-Evaluation): Psychosocial Re-Evaluation - 02/12/18 1503      Psychosocial Re-Evaluation   Current issues with  None Identified    Comments  No psychosocial needs identified. No intervention necessary.    Expected Outcomes  Amanda [name] will continue to exhibit a positive outlook with good coping skills.    Interventions  Encouraged to attend Cardiac Rehabilitation for the exercise    Continue Psychosocial Services   No Follow up required       Vocational Rehabilitation: Provide vocational rehab assistance to qualifying candidates.   Vocational Rehab Evaluation & Intervention: Vocational Rehab - 11/14/17 1140      Initial Vocational Rehab Evaluation & Intervention   Assessment shows need for Vocational Rehabilitation  No       Education: Education Goals: Education classes will be provided on a weekly basis, covering required topics. Participant will state understanding/return demonstration of topics presented.  Learning Barriers/Preferences: Learning Barriers/Preferences - 11/14/17 0947      Learning Barriers/Preferences   Learning Barriers  Sight   cataracts/readers   Learning Preferences  Video;Verbal Instruction;Skilled Demonstration;Pictoral;Written Material       Education Topics: Count Your Pulse:  -Group instruction provided by verbal instruction, demonstration, patient participation and written materials to support subject.  Instructors address importance of being able to find your pulse and how to count your pulse when at home without a heart monitor.  Patients get hands on experience counting their pulse with staff help and individually.   CARDIAC REHAB PHASE II EXERCISE from 02/12/2018 in [facility] CARDIAC REHAB  Date  11/22/17  Instruction Review Code  1- Verbalizes Understanding      Heart Attack, Angina, and Risk Factor Modification:  -Group instruction provided by verbal instruction, video, and written materials to support subject.   Instructors address signs and symptoms of angina and heart attacks.    Also discuss risk factors for heart disease and how to make changes to improve heart health risk factors.   CARDIAC REHAB PHASE II EXERCISE from 02/12/2018 in [facility] CARDIAC REHAB  Date  02/12/18  Instruction Review Code  2- Demonstrated Understanding      Functional Fitness:  -Group instruction provided by verbal instruction, demonstration, patient participation, and written materials to support subject.  Instructors address safety measures for doing things around the house.  Discuss how to get up and down off the floor, how to pick things up properly, how to safely get out of a chair without assistance, and balance training.   CARDIAC REHAB PHASE II EXERCISE from 02/12/2018 in [facility] CARDIAC REHAB  Date  11/27/17  Instruction Review Code  1- Verbalizes Understanding      Meditation and Mindfulness:  -Group instruction provided by verbal instruction, patient participation, and written materials to support subject.  Instructor addresses importance of mindfulness and meditation practice to help reduce stress and improve awareness.  Instructor also leads participants through a meditation exercise.    CARDIAC REHAB PHASE II EXERCISE from 02/12/2018 in [facility] CARDIAC REHAB  Date  01/22/18  Instruction Review Code  1- Verbalizes Understanding  Stretching for Flexibility and Mobility:  -Group instruction provided by verbal instruction, patient participation, and written materials to support subject.  Instructors lead participants through series of stretches that are designed to increase flexibility thus improving mobility.  These stretches are additional exercise for major muscle groups that are typically performed during regular warm up and cool down.   Hands Only CPR:  -Group verbal, video, and participation provides a basic overview of AHA guidelines for  community CPR. Role-play of emergencies allow participants the opportunity to practice calling for help and chest compression technique with discussion of AED use.   Hypertension: -Group verbal and written instruction that provides a basic overview of hypertension including the most recent diagnostic guidelines, risk factor reduction with self-care instructions and medication management.   CARDIAC REHAB PHASE II EXERCISE from 02/12/2018 in [facility] CARDIAC REHAB  Date  12/20/17  Educator  RN  Instruction Review Code  2- Demonstrated Understanding       Nutrition I class: Heart Healthy Eating:  -Group instruction provided by PowerPoint slides, verbal discussion, and written materials to support subject matter. The instructor gives an explanation and review of the Therapeutic Lifestyle Changes diet recommendations, which includes a discussion on lipid goals, dietary fat, sodium, fiber, plant stanol/sterol esters, sugar, and the components of a well-balanced, healthy diet.   CARDIAC REHAB PHASE II EXERCISE from 02/12/2018 in [facility] CARDIAC REHAB  Date  11/14/17  Educator  RD      Nutrition II class: Lifestyle Skills:  -Group instruction provided by PowerPoint slides, verbal discussion, and written materials to support subject matter. The instructor gives an explanation and review of label reading, grocery shopping for heart health, heart healthy recipe modifications, and ways to make healthier choices when eating out.   CARDIAC REHAB PHASE II EXERCISE from 02/12/2018 in [facility] CARDIAC REHAB  Date  11/14/17  Educator  RD      Diabetes Question & Answer:  -Group instruction provided by PowerPoint slides, verbal discussion, and written materials to support subject matter. The instructor gives an explanation and review of diabetes co-morbidities, pre- and post-prandial blood glucose goals, pre-exercise blood glucose goals, signs,  symptoms, and treatment of hypoglycemia and hyperglycemia, and foot care basics.   CARDIAC REHAB PHASE II EXERCISE from 02/12/2018 in [facility] CARDIAC REHAB  Date  01/01/18  Educator  RD  Instruction Review Code  2- Demonstrated Understanding      Diabetes Blitz:  -Group instruction provided by PowerPoint slides, verbal discussion, and written materials to support subject matter. The instructor gives an explanation and review of the physiology behind type 1 and type 2 diabetes, diabetes medications and rational behind using different medications, pre- and post-prandial blood glucose recommendations and Hemoglobin A1c goals, diabetes diet, and exercise including blood glucose guidelines for exercising safely.    Portion Distortion:  -Group instruction provided by PowerPoint slides, verbal discussion, written materials, and food models to support subject matter. The instructor gives an explanation of serving size versus portion size, changes in portions sizes over the last 20 years, and what consists of a serving from each food group.   Stress Management:  -Group instruction provided by verbal instruction, video, and written materials to support subject matter.  Instructors review role of stress in heart disease and how to cope with stress positively.     Exercising on Your Own:  -Group instruction provided by verbal instruction, power point, and written materials to support subject.  Instructors discuss benefits of exercise, components of exercise, frequency and intensity of exercise, and end points for exercise.  Also discuss use of nitroglycerin and activating EMS.  Review options of places to exercise outside of rehab.  Review guidelines for sex with heart disease.   Cardiac Drugs I:  -Group instruction provided by verbal instruction and written materials to support subject.  Instructor reviews cardiac drug classes: antiplatelets, anticoagulants, beta blockers, and  statins.  Instructor discusses reasons, side effects, and lifestyle considerations for each drug class.   CARDIAC REHAB PHASE II EXERCISE from 02/12/2018 in [facility] CARDIAC REHAB  Date  01/15/18  Instruction Review Code  2- Demonstrated Understanding      Cardiac Drugs II:  -Group instruction provided by verbal instruction and written materials to support subject.  Instructor reviews cardiac drug classes: angiotensin converting enzyme inhibitors (ACE-I), angiotensin II receptor blockers (ARBs), nitrates, and calcium channel blockers.  Instructor discusses reasons, side effects, and lifestyle considerations for each drug class.   Anatomy and Physiology of the Circulatory System:  Group verbal and written instruction and models provide basic cardiac anatomy and physiology, with the coronary electrical and arterial systems. Review of: AMI, Angina, Valve disease, Heart Failure, Peripheral Artery Disease, Cardiac Arrhythmia, Pacemakers, and the ICD.   CARDIAC REHAB PHASE II EXERCISE from 02/12/2018 in [facility] CARDIAC REHAB  Date  12/11/17  Instruction Review Code  2- Demonstrated Understanding      Other Education:  -Group or individual verbal, written, or video instructions that support the educational goals of the cardiac rehab program.   Holiday Eating Survival Tips:  -Group instruction provided by PowerPoint slides, verbal discussion, and written materials to support subject matter. The instructor gives patients tips, tricks, and techniques to help them not only survive but enjoy the holidays despite the onslaught of food that accompanies the holidays.   Knowledge Questionnaire Score: Knowledge Questionnaire Score - 11/14/17 1115      Knowledge Questionnaire Score   Pre Score  21/24       Core Components/Risk Factors/Patient Goals at Admission: Personal Goals and Risk Factors at Admission - 11/14/17 1116      Core Components/Risk  Factors/Patient Goals on Admission    Weight Management  Yes       Core Components/Risk Factors/Patient Goals Review:  Goals and Risk Factor Review    Row Name 11/21/17 1218 12/19/17 0745 01/15/18 1111 02/12/18 1502       Core Components/Risk Factors/Patient Goals Review   Personal Goals Review  Weight Management/Obesity  Weight Management/Obesity  Weight Management/Obesity  Weight Management/Obesity    Review  Pt with few CAD RF eager to participate in CR Program.  Amanda [name] is off to a good start with exercise so far. Pt requesting to speak to pharmacist about medication concerns.  Consult placed to facilitate this.   Pt with few CAD RF eager to participate in CR Program.  Amanda [name] spoke with a pharmacist regarding medication concerns.  Amanda [name] feels as if she is stronger and has more energy.  Amanda [name] stated that her family has noted this as well.   Pt with few CAD RF eager to participate in CR Program.  Amanda [name] has not reported any further concerns about her medications.  She continues to gain strength.  Pt with few CAD RF eager to participate in CR Program.  Amanda [name] continues to report increased strength.  She is doing well with exercise. She has expressed interest in participating in the maintenance program  upon graduation.     Expected Outcomes  Pt will continue to participate in exercise, nutrition, and lifestyle modification.   Pt will continue to participate in exercise, nutrition, and lifestyle modification.   Pt will continue to participate in exercise, nutrition, and lifestyle modification.   Pt will continue to participate in exercise, nutrition, and lifestyle modification.        Core Components/Risk Factors/Patient Goals at Discharge (Final Review):  Goals and Risk Factor Review - 02/12/18 1502      Core Components/Risk Factors/Patient Goals Review   Personal Goals Review  Weight Management/Obesity    Review  Pt with few CAD RF eager to participate in CR Program.  Amanda [name] continues  to report increased strength.  She is doing well with exercise. She has expressed interest in participating in the maintenance program upon graduation.     Expected Outcomes  Pt will continue to participate in exercise, nutrition, and lifestyle modification.        ITP Comments: ITP Comments    Row Name 11/14/17 0943 11/21/17 1218 12/19/17 0744 01/15/18 1110 02/12/18 1459   ITP Comments  Dr. Armanda Magic, Medical Director  30 Day ITP Review. Pt recently started exercise with CR Program.  Joliyah tolerated exercise well.  30 Day ITP Review. Pt continues to tolerate exercise well. Amanda [name] feels that she is stronger and has more energy.  30 Day ITP Review. Pt continues to tolerate exercise well. Amanda [name] feels that she continued increasing her strength and energy.  30 Day ITP Review.  Amanda [name] continues to increase her strength.  Vitals have remained stable with adjustments to her BP medications.       Comments: See ITP Comments.

## 2018-02-14 ENCOUNTER — Encounter (HOSPITAL_COMMUNITY): Payer: Medicare Other

## 2018-02-14 ENCOUNTER — Encounter (HOSPITAL_COMMUNITY)
Admission: RE | Admit: 2018-02-14 | Discharge: 2018-02-14 | Disposition: A | Discharge status: Home / Self Care | Payer: Medicare Other | Source: Ambulatory Visit | Attending: Cardiology | Admitting: Cardiology

## 2018-02-14 DIAGNOSIS — Z9889 Other specified postprocedural states: Secondary | ICD-10-CM

## 2018-02-14 DIAGNOSIS — I341 Nonrheumatic mitral (valve) prolapse: Secondary | ICD-10-CM | POA: Diagnosis not present

## 2018-02-17 ENCOUNTER — Encounter (HOSPITAL_COMMUNITY)
Admission: RE | Admit: 2018-02-17 | Discharge: 2018-02-17 | Disposition: A | Discharge status: Home / Self Care | Payer: Medicare Other | Source: Ambulatory Visit | Attending: Cardiology | Admitting: Cardiology

## 2018-02-17 ENCOUNTER — Ambulatory Visit (INDEPENDENT_AMBULATORY_CARE_PROVIDER_SITE_OTHER): Payer: Medicare Other | Admitting: *Deleted

## 2018-02-17 ENCOUNTER — Encounter (HOSPITAL_COMMUNITY): Payer: Medicare Other

## 2018-02-17 DIAGNOSIS — I441 Atrioventricular block, second degree: Secondary | ICD-10-CM

## 2018-02-17 DIAGNOSIS — I341 Nonrheumatic mitral (valve) prolapse: Secondary | ICD-10-CM | POA: Diagnosis not present

## 2018-02-17 NOTE — Progress Notes (Signed)
Remote pacemaker transmission.   

## 2018-02-18 ENCOUNTER — Encounter: Payer: Self-pay | Admitting: Cardiology

## 2018-02-18 NOTE — Progress Notes (Signed)
Reviewed home exercise with pt today.  Pt plans to walk for exercise.  Reviewed THR, pulse, RPE, sign and symptoms, NTG use, and when to call 911 or MD.  Also discussed weather considerations and indoor options.  Pt voiced understanding.  

## 2018-02-19 ENCOUNTER — Encounter (HOSPITAL_COMMUNITY): Payer: Medicare Other

## 2018-02-19 ENCOUNTER — Encounter (HOSPITAL_COMMUNITY)
Admission: RE | Admit: 2018-02-19 | Discharge: 2018-02-19 | Disposition: A | Discharge status: Home / Self Care | Payer: Medicare Other | Source: Ambulatory Visit | Attending: Cardiology | Admitting: Cardiology

## 2018-02-19 VITALS — Ht 67.0 in | Wt 133.6 lb

## 2018-02-19 DIAGNOSIS — Z9889 Other specified postprocedural states: Secondary | ICD-10-CM

## 2018-02-19 DIAGNOSIS — I341 Nonrheumatic mitral (valve) prolapse: Secondary | ICD-10-CM | POA: Diagnosis not present

## 2018-02-20 ENCOUNTER — Ambulatory Visit (INDEPENDENT_AMBULATORY_CARE_PROVIDER_SITE_OTHER): Payer: Medicare Other

## 2018-02-20 DIAGNOSIS — I480 Paroxysmal atrial fibrillation: Secondary | ICD-10-CM

## 2018-02-20 DIAGNOSIS — Z7901 Long term (current) use of anticoagulants: Secondary | ICD-10-CM | POA: Diagnosis not present

## 2018-02-20 DIAGNOSIS — Z5181 Encounter for therapeutic drug level monitoring: Secondary | ICD-10-CM

## 2018-02-20 DIAGNOSIS — Z8673 Personal history of transient ischemic attack (TIA), and cerebral infarction without residual deficits: Secondary | ICD-10-CM | POA: Diagnosis not present

## 2018-02-20 LAB — POCT INR: INR: 2.8 (ref 2.0–3.0)

## 2018-02-20 NOTE — Patient Instructions (Signed)
Description   Continue same dose of coumadin 1 tablet daily except 2 tablets on Sundays.  Recheck INR in 6 weeks. Call us with medication changes or concerns, Main # [phone]. Coumadin Clinic # [phone].      

## 2018-02-21 ENCOUNTER — Other Ambulatory Visit: Payer: Self-pay

## 2018-02-21 ENCOUNTER — Ambulatory Visit (HOSPITAL_COMMUNITY): Payer: Medicare Other | Attending: Cardiology

## 2018-02-21 ENCOUNTER — Encounter (HOSPITAL_COMMUNITY)
Admission: RE | Admit: 2018-02-21 | Discharge: 2018-02-21 | Disposition: A | Discharge status: Home / Self Care | Payer: Medicare Other | Source: Ambulatory Visit | Attending: Cardiology | Admitting: Cardiology

## 2018-02-21 DIAGNOSIS — I4891 Unspecified atrial fibrillation: Secondary | ICD-10-CM | POA: Insufficient documentation

## 2018-02-21 DIAGNOSIS — R931 Abnormal findings on diagnostic imaging of heart and coronary circulation: Secondary | ICD-10-CM | POA: Diagnosis not present

## 2018-02-21 DIAGNOSIS — E785 Hyperlipidemia, unspecified: Secondary | ICD-10-CM | POA: Insufficient documentation

## 2018-02-21 DIAGNOSIS — I341 Nonrheumatic mitral (valve) prolapse: Secondary | ICD-10-CM | POA: Insufficient documentation

## 2018-02-21 DIAGNOSIS — I10 Essential (primary) hypertension: Secondary | ICD-10-CM | POA: Insufficient documentation

## 2018-02-21 DIAGNOSIS — Z9889 Other specified postprocedural states: Secondary | ICD-10-CM

## 2018-02-24 ENCOUNTER — Encounter (HOSPITAL_COMMUNITY)
Admission: RE | Admit: 2018-02-24 | Discharge: 2018-02-24 | Disposition: A | Discharge status: Home / Self Care | Payer: Medicare Other | Source: Ambulatory Visit | Attending: Cardiology | Admitting: Cardiology

## 2018-02-24 DIAGNOSIS — Z9889 Other specified postprocedural states: Secondary | ICD-10-CM

## 2018-02-24 DIAGNOSIS — I341 Nonrheumatic mitral (valve) prolapse: Secondary | ICD-10-CM | POA: Diagnosis not present

## 2018-02-26 ENCOUNTER — Encounter: Payer: Self-pay | Admitting: Internal Medicine

## 2018-02-26 ENCOUNTER — Ambulatory Visit: Payer: Medicare Other | Admitting: Cardiology

## 2018-02-26 ENCOUNTER — Ambulatory Visit: Payer: Medicare Other | Admitting: Internal Medicine

## 2018-02-26 ENCOUNTER — Encounter (HOSPITAL_COMMUNITY)
Admission: RE | Admit: 2018-02-26 | Discharge: 2018-02-26 | Disposition: A | Discharge status: Home / Self Care | Payer: Medicare Other | Source: Ambulatory Visit | Attending: Cardiology | Admitting: Cardiology

## 2018-02-26 VITALS — BP 132/76 | HR 86 | Ht 67.0 in | Wt 136.0 lb

## 2018-02-26 DIAGNOSIS — I341 Nonrheumatic mitral (valve) prolapse: Secondary | ICD-10-CM | POA: Diagnosis not present

## 2018-02-26 DIAGNOSIS — Z950 Presence of cardiac pacemaker: Secondary | ICD-10-CM

## 2018-02-26 DIAGNOSIS — I10 Essential (primary) hypertension: Secondary | ICD-10-CM | POA: Diagnosis not present

## 2018-02-26 DIAGNOSIS — I480 Paroxysmal atrial fibrillation: Secondary | ICD-10-CM

## 2018-02-26 DIAGNOSIS — I442 Atrioventricular block, complete: Secondary | ICD-10-CM | POA: Diagnosis not present

## 2018-02-26 DIAGNOSIS — Z9889 Other specified postprocedural states: Secondary | ICD-10-CM

## 2018-02-26 NOTE — Patient Instructions (Signed)
Medication Instructions:  Your physician recommends that you continue on your current medications as directed. Please refer to the Current Medication list given to you today.  Labwork: None ordered.  Testing/Procedures: None ordered.  Follow-Up: Your physician wants you to follow-up in: one year with Dr. [name].  You will receive a reminder letter in the mail two months in advance. If you don't receive a letter, please call our office to schedule the follow-up appointment.  Remote monitoring is used to monitor your Pacemaker from home. This monitoring reduces the number of office visits required to check your device to one time per year. It allows [place] to keep an eye on the functioning of your device to ensure it is working properly. You are scheduled for a device check from home on 05/19/2018. You may send your transmission at any time that day. If you have a wireless device, the transmission will be sent automatically. After your physician reviews your transmission, you will receive a postcard with your next transmission date.  Any Other Special Instructions Will Be Listed Below (If Applicable).  If you need a refill on your cardiac medications before your next appointment, please call your pharmacy.

## 2018-02-26 NOTE — Progress Notes (Signed)
HPI Amanda [name] returns today for ongoing evaluation and management of her pacemaker, along with atrial and ventricular arrhythmias, and worsening mitral regurgitation. She is a very pleasant 77 year old woman who developed worsening heart failure symptoms and underwent TEE and left and right heart catheterization. She underwent MV repair. She has done well in the interim. No chest pain or sob. She has undergone cardiac rehab and her strength and energy have improved.   Allergies  Allergen Reactions  . Pepcid [Famotidine] Anaphylaxis, Swelling and Other (See Comments)    Causes "Throat Swelling"   . Allopurinol Other (See Comments)    Caused headaches  . Amlodipine Other (See Comments)    Results in PEDAL EDEMA   . Atorvastatin Other (See Comments)    Causes her leg muscles to ache  . Contrast Media [Iodinated Diagnostic Agents] Hives    IVP dye per patient  . Cephalexin Itching and Rash  . Ciprofloxacin Itching and Rash  . Colchicine Rash  . Erythromycin Itching and Rash  . Prilosec [Omeprazole] Nausea Only  . Sulfonamide Derivatives Itching and Rash  . Tetracycline Itching and Rash     Current Outpatient Medications  Medication Sig Dispense Refill  . acetaminophen (TYLENOL) 500 MG tablet Take 500 mg by mouth every 6 (six) hours as needed.     [name] aMILoride (MIDAMOR) 5 MG tablet Take 2.5 mg by mouth daily.    . calcitRIOL (ROCALTROL) 0.25 MCG capsule Take 0.25 mcg by mouth every 3 (three) days. In the morning     . conjugated estrogens (PREMARIN) vaginal cream Place 1 Applicatorful vaginally at bedtime.     . fexofenadine (ALLEGRA) 180 MG tablet Take 180 mg by mouth daily.    . fluticasone (FLONASE) 50 MCG/ACT nasal spray Place 2 sprays into both nostrils daily.     [name] levothyroxine (SYNTHROID, LEVOTHROID) 75 MCG tablet Take 1 tablet (75 mcg total) by mouth See admin instructions. Take 75 mcg by mouth daily around 0300 (Patient taking differently: Take 75 mcg by mouth  daily at 2 am. (0300)) 30 tablet 0  . metoprolol tartrate (LOPRESSOR) 25 MG tablet Take 1 tablet (25 mg total) by mouth 2 (two) times daily. 180 tablet 3  . naproxen sodium (ALEVE) 220 MG tablet Take 220 mg by mouth daily as needed (for severe back pain.).    [name] Probiotic Product (PROBIOTIC DAILY PO) Take 1 capsule by mouth 2 (two) times a week. In the morning    . vitamin B-12 (CYANOCOBALAMIN) 1000 MCG tablet Take 1,000 mcg by mouth once a week.    . warfarin (COUMADIN) 1 MG tablet Take 1 tablet (1 mg total) by mouth daily. (Patient taking differently: Take 1 mg by mouth every evening. ) 30 tablet 11   No current facility-administered medications for this visit.      Past Medical History:  Diagnosis Date  . Allergic rhinitis   . Anemia   . Asymptomatic LV dysfunction    EF 45-50% echo 07/2017  . CKD (chronic kidney disease), stage III (HCC)    stage III  . DJD (degenerative joint disease)   . Glomerulonephritis    Dr [name]  . Hyperlipidemia   . Hyperparathyroidism (HCC)   . Hypertension   . Hypothyroidism   . IBS (irritable bowel syndrome)   . Interstitial cystitis   . Mitral regurgitation   . Mobitz II    a. s/p STJ dual chamber PPM   . MVP (mitral valve prolapse)  moderate posterior MVP with moderate MR and grade II diasotlic dysfunction  . PAC (premature atrial contraction)   . PAF (paroxysmal atrial fibrillation) (HCC)     note on pacer check. CHADS2VASC score is 4 now on Eliquis.  [name] PVC's (premature ventricular contractions)   . S/P minimally invasive maze operation for atrial fibrillation 04/16/2017   Complete bilateral atrial lesion set using cryothermy and bipolar radiofrequency ablation with clipping of LA appendage via right mini thoracotomy approach  . S/P minimally invasive mitral valve repair 04/16/2017   Complex valvuloplasty including triangular resection of posterior leaflet, artificial Gore-tex neochords x6 and Sorin Memo 3D ring annuloplasty ([id number], size  32, serial # [id number])  . S/P placement of cardiac pacemaker   . Small vessel disease, cerebrovascular   . Stroke [facility])    a. old stroke seen on imaging.    ROS:   All systems reviewed and negative except as noted in the HPI.   Past Surgical History:  Procedure Laterality Date  . ABDOMINAL HYSTERECTOMY    . BREAST BIOPSY Right   . CHEST TUBE INSERTION Right 05/23/2017   Procedure: INSERTION PLEURAL DRAINAGE CATHETER;  Surgeon: Purcell Nails, MD;  Location: [facility] OR;  Service: Thoracic;  Laterality: Right;  possible pleurex catheter  . CLIPPING OF ATRIAL APPENDAGE  04/16/2017   Procedure: CLIPPING OF ATRIAL APPENDAGE using AtriCure Pro2 clip 45;  Surgeon: Purcell Nails, MD;  Location: [facility] OR;  Service: Open Heart Surgery;;  . CYSTOSTOMY W/ BLADDER BIOPSY    . EP IMPLANTABLE DEVICE N/A 01/07/2015   STJ dual chamber pacemaker implanted by Dr [name] for 2:1 heart block  . MINIMALLY INVASIVE MAZE PROCEDURE N/A 04/16/2017   Procedure: MINIMALLY INVASIVE MAZE PROCEDURE;  Surgeon: Purcell Nails, MD;  Location: [facility] OR;  Service: Open Heart Surgery;  Laterality: N/A;  . MITRAL VALVE REPAIR Right 04/16/2017   Procedure: MINIMALLY INVASIVE MITRAL VALVE REPAIR (MVR);  Surgeon: Purcell Nails, MD;  Location: [facility] OR;  Service: Open Heart Surgery;  Laterality: Right;  . MITRAL VALVE REPAIR Right 04/16/2017   Procedure: RE-EXPLORATION RIGHT THORACOTOMY FOR BLEEDING;  Surgeon: Purcell Nails, MD;  Location: [facility] OR;  Service: Open Heart Surgery;  Laterality: Right;  . PERIPHERAL VASCULAR CATHETERIZATION N/A 02/03/2015   Procedure: Upper Extremity Venography;  Surgeon: [name], MD;  Location: [facility] INVASIVE CV LAB;  Service: Cardiovascular;  Laterality: N/A;  . REMOVAL OF PLEURAL DRAINAGE CATHETER Right 09/03/2017   Procedure: REMOVAL OF PLEURAL DRAINAGE CATHETER;  Surgeon: Purcell Nails, MD;  Location: [facility] OR;  Service: Thoracic;  Laterality: Right;  . RIGHT/LEFT HEART CATH AND CORONARY ANGIOGRAPHY  N/A 03/26/2017   Procedure: RIGHT/LEFT HEART CATH AND CORONARY ANGIOGRAPHY;  Surgeon: [name], MD;  Location: [facility] INVASIVE CV LAB;  Service: Cardiovascular;  Laterality: N/A;  . TEE WITHOUT CARDIOVERSION N/A 03/18/2017   Procedure: TRANSESOPHAGEAL ECHOCARDIOGRAM (TEE);  Surgeon: [name], MD;  Location: [facility] ENDOSCOPY;  Service: Cardiovascular;  Laterality: N/A;  . TEE WITHOUT CARDIOVERSION N/A 04/16/2017   Procedure: TRANSESOPHAGEAL ECHOCARDIOGRAM (TEE);  Surgeon: Purcell Nails, MD;  Location: [facility] OR;  Service: Open Heart Surgery;  Laterality: N/A;  . TONSILLECTOMY       Family History  Problem Relation Age of Onset  . Heart disease Mother   . Stroke Mother   . Hypertension Mother   . Heart disease Father   . Heart attack Father   . Ovarian cancer Sister   . Heart disease Brother   .  Rheum arthritis Sister   . Melanoma Brother   . Brain cancer Brother   . Heart attack Brother   . Hypertension Brother   . Hypertension Sister   . Colon cancer Neg Hx      Social History   Socioeconomic History  . Marital status: Married    Spouse name: Not on file  . Number of children: 2  . Years of education: Not on file  . Highest education level: Not on file  Occupational History  . Occupation: retired  Engineer, production  . Financial resource strain: Not on file  . Food insecurity:    Worry: Not on file    Inability: Not on file  . Transportation needs:    Medical: Not on file    Non-medical: Not on file  Tobacco Use  . Smoking status: Former Games developer  . Smokeless tobacco: Never Used  Substance and Sexual Activity  . Alcohol use: No  . Drug use: No  . Sexual activity: Not on file  Lifestyle  . Physical activity:    Days per week: Not on file    Minutes per session: Not on file  . Stress: Not on file  Relationships  . Social connections:    Talks on phone: Not on file    Gets together: Not on file    Attends religious service: Not on file    Active member of  club or organization: Not on file    Attends meetings of clubs or organizations: Not on file    Relationship status: Not on file  . Intimate partner violence:    Fear of current or ex partner: Not on file    Emotionally abused: Not on file    Physically abused: Not on file    Forced sexual activity: Not on file  Other Topics Concern  . Not on file  Social History Narrative   Patient drinks 1-2 cups of caffeine daily.   Patient is right handed.      BP 132/76   Pulse 86   Ht 5\' 7"  (1.702 m)   Wt 136 lb (61.7 kg)   BMI 21.30 kg/m   Physical Exam:  Well appearing 77 yo woman, NAD HEENT: Unremarkable Neck:  6 cm JVD, no thyromegally Lymphatics:  No adenopathy Back:  No CVA tenderness Lungs:  Clear with no wheezes HEART:  Regular rate rhythm, no murmurs, no rubs, no clicks Abd:  soft, positive bowel sounds, no organomegally, no rebound, no guarding Ext:  2 plus pulses, no edema, no cyanosis, no clubbing Skin:  No rashes no nodules Neuro:  CN II through XII intact, motor grossly intact  DEVICE  Normal device function.  See PaceArt for details.    Assess/Plan: 1. PAF - she is maintaining NSR on Warfarin, s/p MAZE. She has had atrial fib up to 2 hours in duration. She is s/p MAZE.  2. PPM - her St. Jude DDD PM is working normally. We will recheck in several months. 3. HTN - her blood pressure is minimally elevated. She will continue her current meds.  4. PVC"s - interogation of her PPM demonstrates 3.4% of her heart beats are PVC's. As she is asymptomatic, we will undergo watchful waiting.  Nechemia Chiappetta,M.D.  Amanda [name].D.

## 2018-02-28 ENCOUNTER — Encounter (HOSPITAL_COMMUNITY)
Admission: RE | Admit: 2018-02-28 | Discharge: 2018-02-28 | Disposition: A | Discharge status: Home / Self Care | Payer: Medicare Other | Source: Ambulatory Visit | Attending: Cardiology | Admitting: Cardiology

## 2018-02-28 DIAGNOSIS — I341 Nonrheumatic mitral (valve) prolapse: Secondary | ICD-10-CM | POA: Diagnosis not present

## 2018-02-28 DIAGNOSIS — Z9889 Other specified postprocedural states: Secondary | ICD-10-CM

## 2018-03-02 LAB — CUP PACEART INCLINIC DEVICE CHECK
Battery Remaining Longevity: 120 mo
Brady Statistic RA Percent Paced: 24 %
Brady Statistic RV Percent Paced: 95 %
Date Time Interrogation Session: 20190821193704
Implantable Lead Implant Date: 20160701
Implantable Lead Location: 753859
Implantable Lead Location: 753860
Lead Channel Impedance Value: 587.5 Ohm
Lead Channel Pacing Threshold Amplitude: 0.75 V
Lead Channel Pacing Threshold Amplitude: 1 V
Lead Channel Pacing Threshold Pulse Width: 0.4 ms
Lead Channel Pacing Threshold Pulse Width: 0.4 ms
Lead Channel Setting Pacing Amplitude: 1 V
Lead Channel Setting Pacing Amplitude: 2 V
Lead Channel Setting Pacing Pulse Width: 0.4 ms
Lead Channel Setting Sensing Sensitivity: 2 mV
MDC IDC LEAD IMPLANT DT: 20160701
MDC IDC MSMT BATTERY VOLTAGE: 2.99 V
MDC IDC MSMT LEADCHNL RA IMPEDANCE VALUE: 425 Ohm
MDC IDC MSMT LEADCHNL RA PACING THRESHOLD AMPLITUDE: 0.75 V
MDC IDC MSMT LEADCHNL RA PACING THRESHOLD PULSEWIDTH: 0.4 ms
MDC IDC MSMT LEADCHNL RA SENSING INTR AMPL: 5 mV
MDC IDC MSMT LEADCHNL RV PACING THRESHOLD AMPLITUDE: 1 V
MDC IDC MSMT LEADCHNL RV PACING THRESHOLD PULSEWIDTH: 0.4 ms
MDC IDC MSMT LEADCHNL RV SENSING INTR AMPL: 12 mV
MDC IDC PG IMPLANT DT: 20160701
Pulse Gen Model: 2240
Pulse Gen Serial Number: 7785935

## 2018-03-03 ENCOUNTER — Encounter (HOSPITAL_COMMUNITY)
Admission: RE | Admit: 2018-03-03 | Discharge: 2018-03-03 | Disposition: A | Discharge status: Home / Self Care | Payer: Medicare Other | Source: Ambulatory Visit | Attending: Cardiology | Admitting: Cardiology

## 2018-03-03 DIAGNOSIS — I341 Nonrheumatic mitral (valve) prolapse: Secondary | ICD-10-CM | POA: Diagnosis not present

## 2018-03-03 DIAGNOSIS — Z9889 Other specified postprocedural states: Secondary | ICD-10-CM

## 2018-03-05 NOTE — Progress Notes (Signed)
Cardiology Office Note    Date:  03/11/2018   ID:  Amanda [name], DOB 1941/06/09, MRN [id number]  PCP:  [name], MD  Cardiologist: No primary care provider on file.  EPS Dr. [name] No chief complaint on file.   History of Present Illness:  Amanda [name] is a 77 y.o. female status post minimally invasive mitral valve repair and maze procedure 04/16/2017.  She had a complicated course requiring inpatient rehab and right thoracentesis.  She then underwent right Pleurx catheter for recurrence.  Also has history of PACs, PVCs, PAF, hypertension,  Recently seen by Dr. [name] 02/26/2018 who manages her pacemaker as well as PAF maintaining normal sinus rhythm status post maze.  CHA2DS2-VASc equals 4 on NOAC. she had PVCs 3.4% of her heartbeats on interrogation of pacemaker.  She was asymptomatic and he went under the watchful waiting.  She comes in today accompanied by her daughter Amanda [name].  She has had low blood pressures recently during rehab and her metoprolol was decreased from 100 mg down to 50 mg twice daily then 25 mg p.o. daily by [name].  Patient is not having dizziness if she had been and is feeling better.  They are wondering if she can get rid of the amiloride which is prescribed by renal.  She is tried to get off it in the past but did have some leg edema.  She eats out a fair amount and does get some sodium in her diet.  Blood pressure is up today.  She says she has whitecoat syndrome.  No other cardiac complaints.  Past Medical History:  Diagnosis Date  . Allergic rhinitis   . Anemia   . Asymptomatic LV dysfunction    EF 45-50% echo 07/2017  . CKD (chronic kidney disease), stage III (HCC)    stage III  . DJD (degenerative joint disease)   . Glomerulonephritis    Dr [name]  . Hyperlipidemia   . Hyperparathyroidism (HCC)   . Hypertension   . Hypothyroidism   . IBS (irritable bowel syndrome)   . Interstitial cystitis   . Mitral regurgitation   .  Mobitz II    a. s/p STJ dual chamber PPM   . MVP (mitral valve prolapse)    moderate posterior MVP with moderate MR and grade II diasotlic dysfunction  . PAC (premature atrial contraction)   . PAF (paroxysmal atrial fibrillation) (HCC)     note on pacer check. CHADS2VASC score is 4 now on Eliquis.  [name] PVC's (premature ventricular contractions)   . S/P minimally invasive maze operation for atrial fibrillation 04/16/2017   Complete bilateral atrial lesion set using cryothermy and bipolar radiofrequency ablation with clipping of LA appendage via right mini thoracotomy approach  . S/P minimally invasive mitral valve repair 04/16/2017   Complex valvuloplasty including triangular resection of posterior leaflet, artificial Gore-tex neochords x6 and Sorin Memo 3D ring annuloplasty ([id number], size 32, serial # [id number])  . S/P placement of cardiac pacemaker   . Small vessel disease, cerebrovascular   . Stroke [facility])    a. old stroke seen on imaging.    Past Surgical History:  Procedure Laterality Date  . ABDOMINAL HYSTERECTOMY    . BREAST BIOPSY Right   . CHEST TUBE INSERTION Right 05/23/2017   Procedure: INSERTION PLEURAL DRAINAGE CATHETER;  Surgeon: Purcell Nails, MD;  Location: [facility] OR;  Service: Thoracic;  Laterality: Right;  possible pleurex catheter  . CLIPPING OF ATRIAL APPENDAGE  04/16/2017   Procedure:  CLIPPING OF ATRIAL APPENDAGE using AtriCure Pro2 clip 45;  Surgeon: Purcell Nails, MD;  Location: [facility] OR;  Service: Open Heart Surgery;;  . CYSTOSTOMY W/ BLADDER BIOPSY    . EP IMPLANTABLE DEVICE N/A 01/07/2015   STJ dual chamber pacemaker implanted by Dr [name] for 2:1 heart block  . MINIMALLY INVASIVE MAZE PROCEDURE N/A 04/16/2017   Procedure: MINIMALLY INVASIVE MAZE PROCEDURE;  Surgeon: Purcell Nails, MD;  Location: [facility] OR;  Service: Open Heart Surgery;  Laterality: N/A;  . MITRAL VALVE REPAIR Right 04/16/2017   Procedure: MINIMALLY INVASIVE MITRAL VALVE REPAIR (MVR);  Surgeon: Purcell Nails, MD;  Location: [facility] OR;  Service: Open Heart Surgery;  Laterality: Right;  . MITRAL VALVE REPAIR Right 04/16/2017   Procedure: RE-EXPLORATION RIGHT THORACOTOMY FOR BLEEDING;  Surgeon: Purcell Nails, MD;  Location: [facility] OR;  Service: Open Heart Surgery;  Laterality: Right;  . PERIPHERAL VASCULAR CATHETERIZATION N/A 02/03/2015   Procedure: Upper Extremity Venography;  Surgeon: [name], MD;  Location: [facility] INVASIVE CV LAB;  Service: Cardiovascular;  Laterality: N/A;  . REMOVAL OF PLEURAL DRAINAGE CATHETER Right 09/03/2017   Procedure: REMOVAL OF PLEURAL DRAINAGE CATHETER;  Surgeon: Purcell Nails, MD;  Location: [facility] OR;  Service: Thoracic;  Laterality: Right;  . RIGHT/LEFT HEART CATH AND CORONARY ANGIOGRAPHY N/A 03/26/2017   Procedure: RIGHT/LEFT HEART CATH AND CORONARY ANGIOGRAPHY;  Surgeon: [name], MD;  Location: [facility] INVASIVE CV LAB;  Service: Cardiovascular;  Laterality: N/A;  . TEE WITHOUT CARDIOVERSION N/A 03/18/2017   Procedure: TRANSESOPHAGEAL ECHOCARDIOGRAM (TEE);  Surgeon: [name], MD;  Location: [facility] ENDOSCOPY;  Service: Cardiovascular;  Laterality: N/A;  . TEE WITHOUT CARDIOVERSION N/A 04/16/2017   Procedure: TRANSESOPHAGEAL ECHOCARDIOGRAM (TEE);  Surgeon: Purcell Nails, MD;  Location: [facility] OR;  Service: Open Heart Surgery;  Laterality: N/A;  . TONSILLECTOMY      Current Medications: Current Meds  Medication Sig  . acetaminophen (TYLENOL) 500 MG tablet Take 500 mg by mouth every 6 (six) hours as needed.   [name] aMILoride (MIDAMOR) 5 MG tablet Take 2.5 mg by mouth daily.  . calcitRIOL (ROCALTROL) 0.25 MCG capsule Take 0.25 mcg by mouth every 3 (three) days. In the morning   . conjugated estrogens (PREMARIN) vaginal cream Place 1 Applicatorful vaginally at bedtime.   . fexofenadine (ALLEGRA) 180 MG tablet Take 180 mg by mouth daily.  . fluticasone (FLONASE) 50 MCG/ACT nasal spray Place 2 sprays into both nostrils daily.   [name] levothyroxine (SYNTHROID, LEVOTHROID)  75 MCG tablet Take 75 mcg by mouth daily.  . metoprolol tartrate (LOPRESSOR) 25 MG tablet Take 1 tablet (25 mg total) by mouth 2 (two) times daily.  . naproxen sodium (ALEVE) 220 MG tablet Take 220 mg by mouth daily as needed (for severe back pain.).  [name] Probiotic Product (PROBIOTIC DAILY PO) Take 1 capsule by mouth 2 (two) times a week. In the morning  . vitamin B-12 (CYANOCOBALAMIN) 1000 MCG tablet Take 1,000 mcg by mouth once a week.  . warfarin (COUMADIN) 1 MG tablet Take 1 tablet (1 mg total) by mouth daily. (Patient taking differently: Take 1 mg by mouth every evening. )     Allergies:   Pepcid [famotidine]; Allopurinol; Amlodipine; Atorvastatin; Contrast media [iodinated diagnostic agents]; Cephalexin; Ciprofloxacin; Colchicine; Erythromycin; Prilosec [omeprazole]; Sulfonamide derivatives; and Tetracycline   Social History   Socioeconomic History  . Marital status: Married    Spouse name: Not on file  . Number of children: 2  . Years of  education: Not on file  . Highest education level: Not on file  Occupational History  . Occupation: retired  Engineer, production  . Financial resource strain: Not on file  . Food insecurity:    Worry: Not on file    Inability: Not on file  . Transportation needs:    Medical: Not on file    Non-medical: Not on file  Tobacco Use  . Smoking status: Former Games developer  . Smokeless tobacco: Never Used  Substance and Sexual Activity  . Alcohol use: No  . Drug use: No  . Sexual activity: Not on file  Lifestyle  . Physical activity:    Days per week: Not on file    Minutes per session: Not on file  . Stress: Not on file  Relationships  . Social connections:    Talks on phone: Not on file    Gets together: Not on file    Attends religious service: Not on file    Active member of club or organization: Not on file    Attends meetings of clubs or organizations: Not on file    Relationship status: Not on file  Other Topics Concern  . Not on file  Social  History Narrative   Patient drinks 1-2 cups of caffeine daily.   Patient is right handed.      Family History:  The patient's family history includes Brain cancer in her brother; Heart attack in her brother and father; Heart disease in her brother, father, and mother; Hypertension in her brother, mother, and sister; Melanoma in her brother; Ovarian cancer in her sister; Rheum arthritis in her sister; Stroke in her mother.   ROS:   Please see the history of present illness.    Review of Systems  Constitution: Negative.  HENT: Negative.   Eyes: Negative.   Cardiovascular: Negative.   Respiratory: Negative.   Hematologic/Lymphatic: Negative.   Musculoskeletal: Negative.  Negative for joint pain.  Gastrointestinal: Negative.   Genitourinary: Negative.   Neurological: Positive for dizziness.   All other systems reviewed and are negative.   PHYSICAL EXAM:   VS:  BP (!) 142/86   Pulse 81   Ht 5\' 7"  (1.702 m)   Wt 133 lb 1.9 oz (60.4 kg)   SpO2 93%   BMI 20.85 kg/m   Physical Exam  GEN: Well nourished, well developed, in no acute distress  Neck: no JVD, carotid bruits, or masses Cardiac:RRR; 1/6 systolic murmur at the apex.  Respiratory:  clear to auscultation bilaterally, normal work of breathing GI: soft, nontender, nondistended, + BS Ext: without cyanosis, clubbing, or edema, Good distal pulses bilaterally Neuro:  Alert and Oriented x 3 Psych: euthymic mood, full affect  Wt Readings from Last 3 Encounters:  03/11/18 133 lb 1.9 oz (60.4 kg)  02/26/18 136 lb (61.7 kg)  02/19/18 133 lb 9.6 oz (60.6 kg)      Studies/Labs Reviewed:   EKG:  EKG is not ordered today.    Recent Labs: 04/22/2017: Magnesium 1.9 05/17/2017: TSH 2.010 05/22/2017: ALT 16 05/23/2017: Hemoglobin 10.3; Platelets 233 05/24/2017: BUN 9; Creatinine, Ser 1.26; Potassium 3.2; Sodium 137   Lipid Panel    Component Value Date/Time   CHOL 153 01/07/2015 0304   TRIG 197 (H) 04/22/2017 0419   HDL 51  01/07/2015 0304   CHOLHDL 3.0 01/07/2015 0304   VLDL 13 01/07/2015 0304   LDLCALC 89 01/07/2015 0304    Additional studies/ records that were reviewed today include:    2Decho  02/21/18 Study Conclusions   - Procedure narrative: Transthoracic echocardiography. Image   quality was adequate. The study was technically difficult. - Left ventricle: The cavity size was normal. Wall thickness was   increased in a pattern of mild LVH. Systolic function was normal.   The estimated ejection fraction was in the range of 55% to 60%.   Possible hypokinesis of the apical myocardium. The study is not   technically sufficient to allow evaluation of LV diastolic   function. - Mitral valve: Annuloplasty ring s/p MV repair. Normal function.   There was trivial regurgitation. - Right ventricle: Pacer wire or catheter noted in right ventricle.      ASSESSMENT:    1. S/P minimally invasive mitral valve repair + maze procedure   2. Paroxysmal atrial fibrillation (HCC)   3. Essential hypertension   4. Stage 3 chronic kidney disease (HCC)      PLAN:  In order of problems listed above: Status post minimally invasive mitral valve repair and maze procedure 04/2017.  Recent 2D echo 02/2018 normal LV function normal functioning mitral valve with trivial MR  PAF CHA2DS2-VASc equals 4 on NOAC  Essential hypertension-blood pressure has been low and her Toprol has gradually been decreased.  Doing better blood pressure up today.  They will continue to check her blood pressures at home and if they run low she could try stopping amiloride follow-up with [name] as scheduled  CKD stage III followed by renal  Pacemaker followed by Dr. [name], PVC's      Medication Adjustments/Labs and Tests Ordered: Current medicines are reviewed at length with the patient today.  Concerns regarding medicines are outlined above.  Medication changes, Labs and Tests ordered today are listed in the Patient Instructions  below. Patient Instructions  Medication Instructions:  Your physician recommends that you continue on your current medications as directed. Please refer to the Current Medication list given to you today.  Labwork: None ordered  Testing/Procedures: None ordered  Follow-Up: You have been referred to Cardiac Rehab Maintenance Program  Keep follow up appointment with [name] on 04/16/18 at 8:00 AM  Any Other Special Instructions Will Be Listed Below (If Applicable).  Continue to monitor Blood Pressure at home and let us know if you continue to have low blood pressure readings   If you need a refill on your cardiac medications before your next appointment, please call your pharmacy.     Elson Clan, PA-C  03/11/2018 10:59 AM    [facility] [address] [place], [place], [place]  [address] Phone: [phone]; Fax: [phone]

## 2018-03-07 ENCOUNTER — Encounter: Payer: Self-pay | Admitting: Physician Assistant

## 2018-03-07 IMAGING — DX DG CHEST 1V
1 series · 1 of 1 positions shown · non-contrast
Comparison: 04/19/2017

CLINICAL DATA: Repositioned right PICC line

EXAM:
CHEST 1 VIEW

[chest ap]
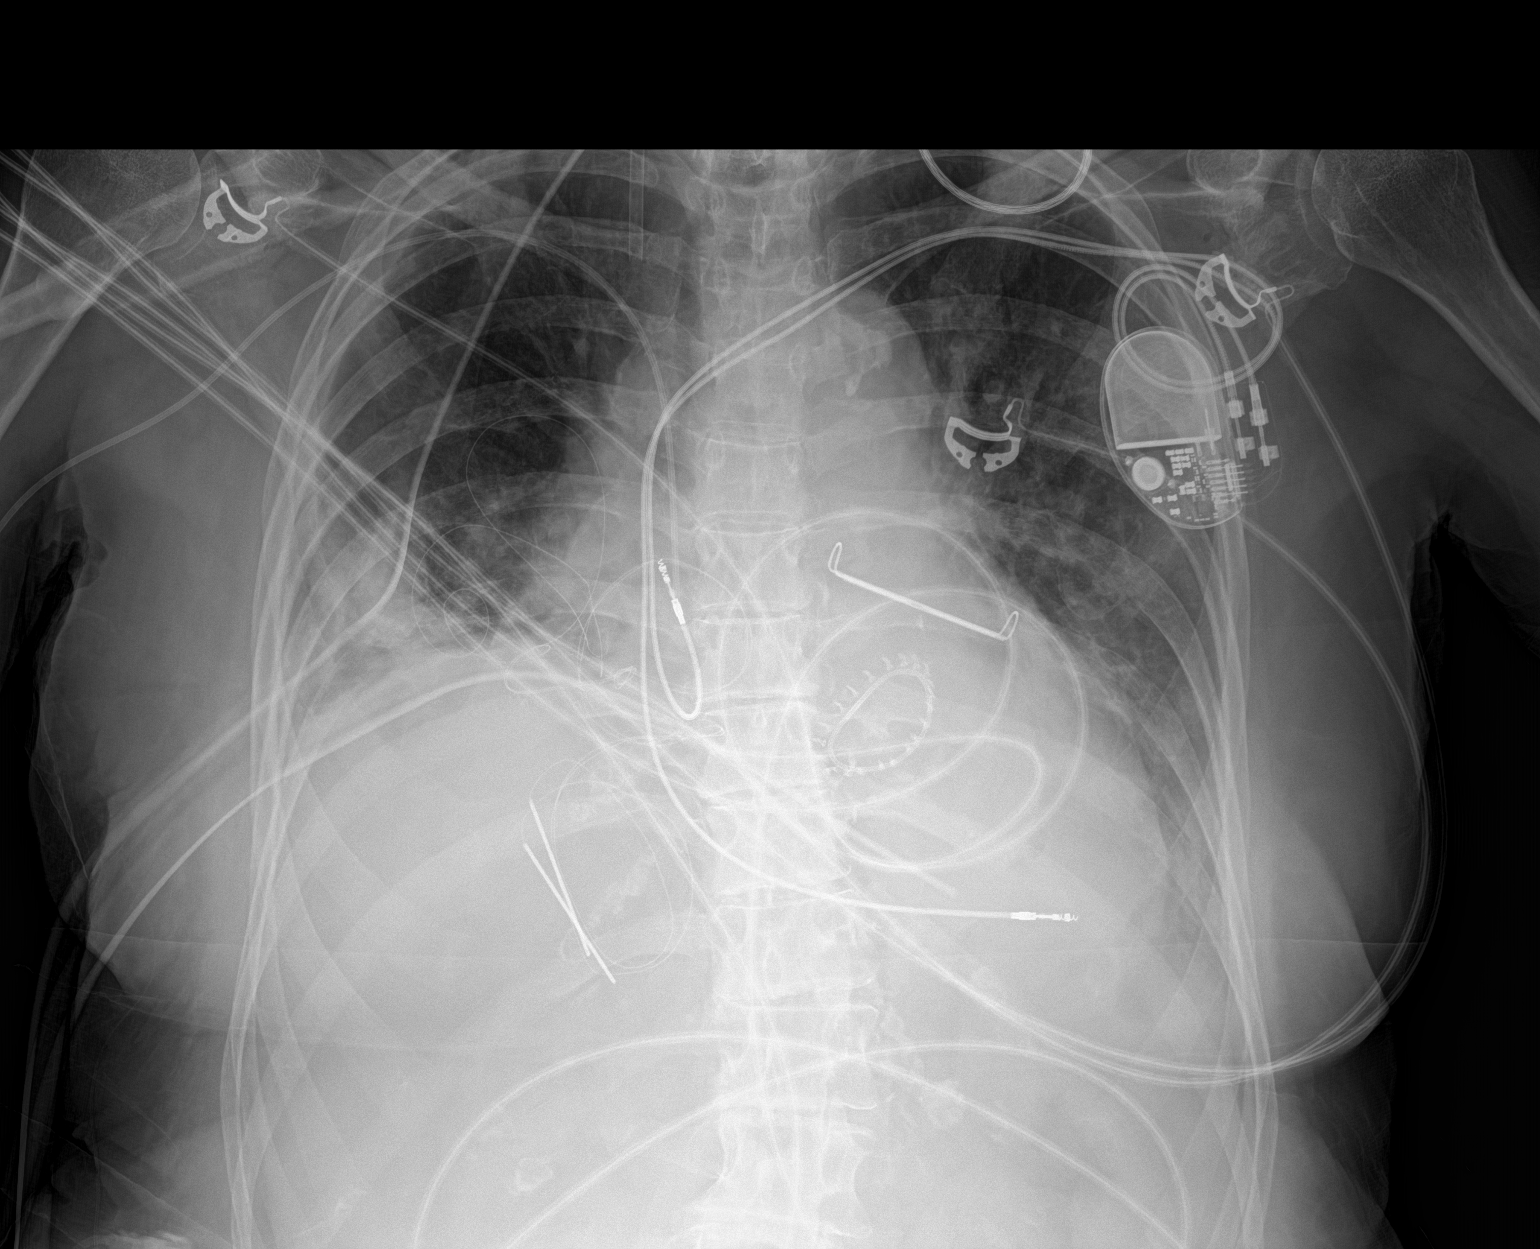

[1 of 1 positions shown; findings below may reference images not displayed]

FINDINGS: Right-sided PICC line has been repositioned, tip overlying the level
of the lower superior vena cava.

Right-sided chest tube, right IJ a she appear unchanged. Left atrial
appendage clip appears stable in position. Patient has a left-sided
transvenous pacemaker with leads to the right atrium and right
ventricle. The heart is enlarged. There is dense opacity at the left
lung base, stable in appearance. Patchy opacity at the right lung
base is also stable in appearance. Elevation of the right
hemidiaphragm is stable.
IMPRESSION: Interval repositioning of right-sided PICC line.

Stable cardiomegaly and bibasilar opacities, left greater than
right.

## 2018-03-07 IMAGING — DX DG CHEST 1V PORT
1 series · 1 of 1 positions shown · non-contrast
Comparison: 04/19/2017 at [DATE] a.m.

CLINICAL DATA: Right sided PICC insertion.

EXAM:
PORTABLE CHEST 1 VIEW

[chest]
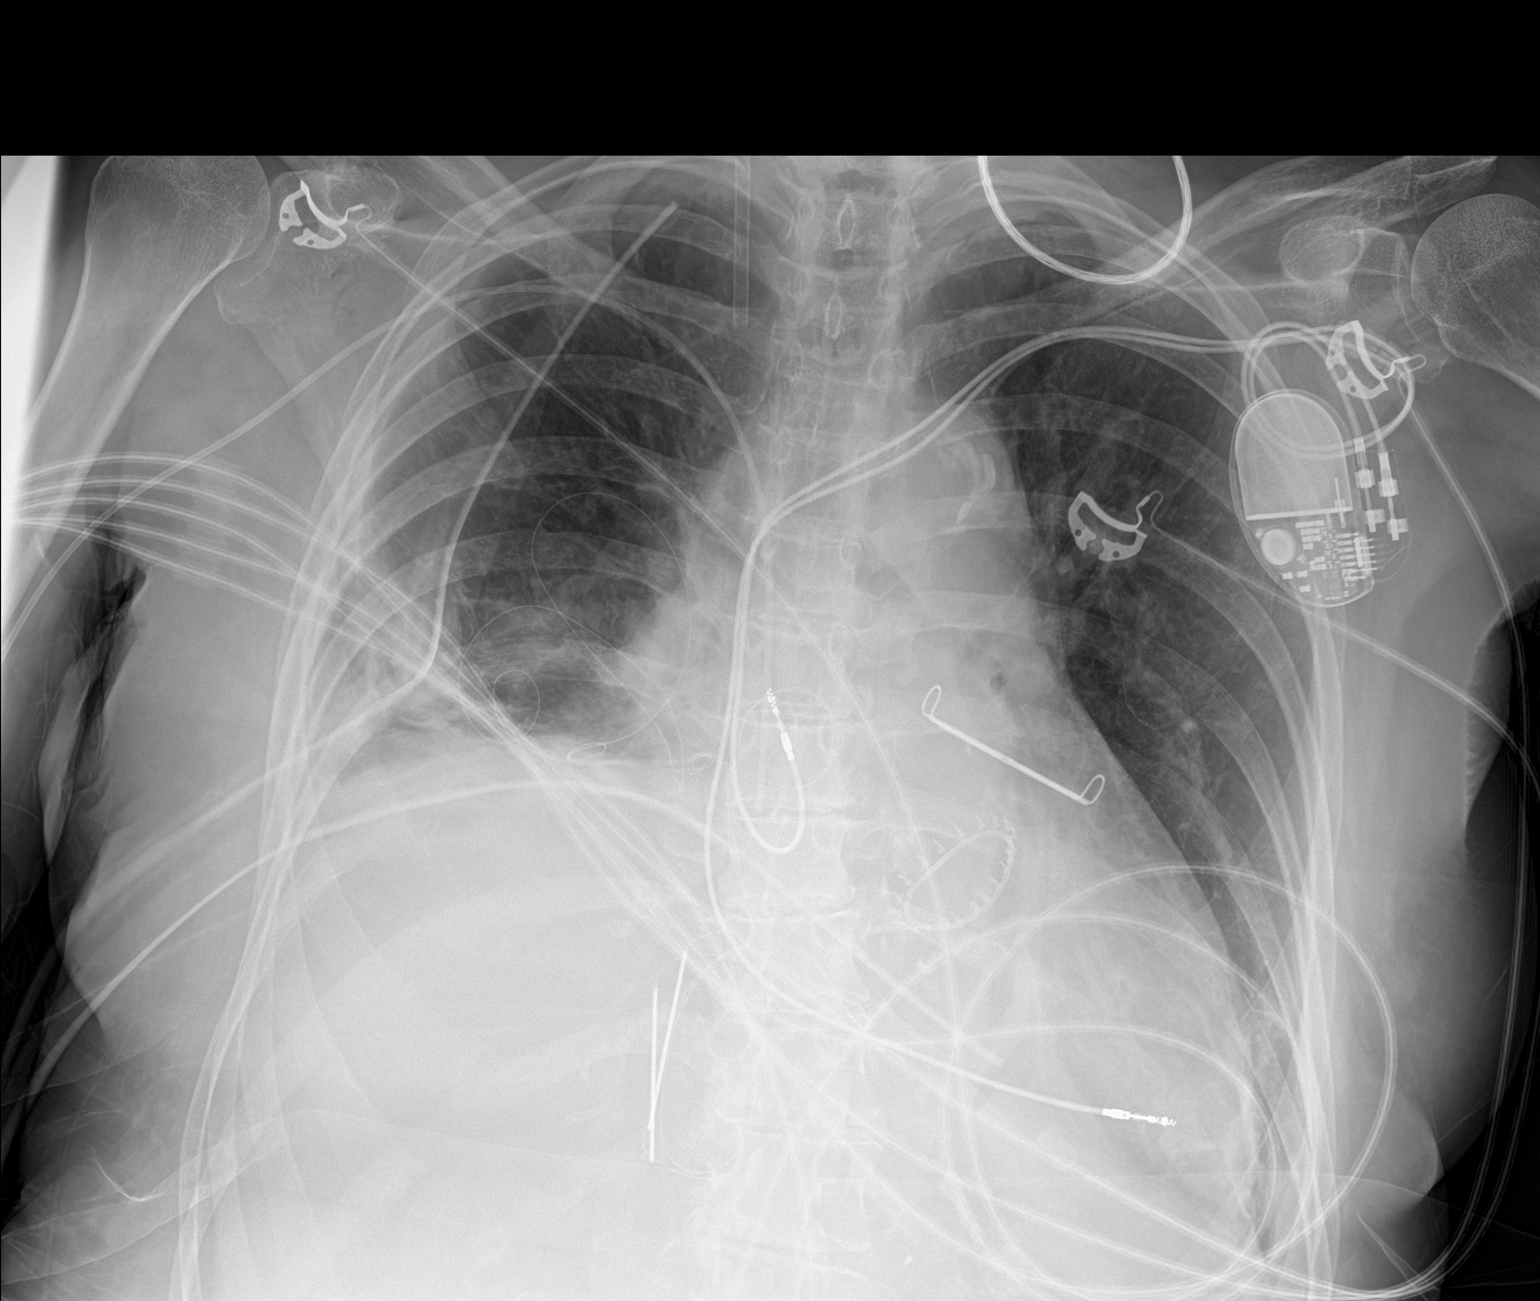

[1 of 1 positions shown; findings below may reference images not displayed]

FINDINGS: New right PICC tip projects in the right atrium.

Right sided chest tubes and right internal jugular introducer sheath
are stable. No pneumothorax.

Persistent right lung base opacity is noted consistent with
atelectasis. No new lung abnormalities.
IMPRESSION: 1. Right PICC line tip projects in the right atrium. No
pneumothorax.
2. No other change from the prior study.

## 2018-03-11 ENCOUNTER — Ambulatory Visit: Payer: Medicare Other | Admitting: Physician Assistant

## 2018-03-11 ENCOUNTER — Encounter: Payer: Self-pay | Admitting: Physician Assistant

## 2018-03-11 VITALS — BP 142/86 | HR 81 | Ht 67.0 in | Wt 133.1 lb

## 2018-03-11 DIAGNOSIS — N183 Chronic kidney disease, stage 3 unspecified: Secondary | ICD-10-CM

## 2018-03-11 DIAGNOSIS — Z9889 Other specified postprocedural states: Secondary | ICD-10-CM

## 2018-03-11 DIAGNOSIS — I10 Essential (primary) hypertension: Secondary | ICD-10-CM | POA: Diagnosis not present

## 2018-03-11 DIAGNOSIS — I480 Paroxysmal atrial fibrillation: Secondary | ICD-10-CM

## 2018-03-11 NOTE — Patient Instructions (Addendum)
Medication Instructions:  Your physician recommends that you continue on your current medications as directed. Please refer to the Current Medication list given to you today.  Labwork: None ordered  Testing/Procedures: None ordered  Follow-Up: You have been referred to Cardiac Rehab Maintenance Program  Keep follow up appointment with [name] on 04/16/18 at 8:00 AM  Any Other Special Instructions Will Be Listed Below (If Applicable).  Continue to monitor Blood Pressure at home and let us know if you continue to have low blood pressure readings   If you need a refill on your cardiac medications before your next appointment, please call your pharmacy.

## 2018-03-14 LAB — CUP PACEART REMOTE DEVICE CHECK
Brady Statistic AP VP Percent: 27 %
Brady Statistic AS VP Percent: 68 %
Brady Statistic RA Percent Paced: 24 %
Brady Statistic RV Percent Paced: 95 %
Date Time Interrogation Session: 20190812062643
Implantable Lead Implant Date: 20160701
Lead Channel Impedance Value: 600 Ohm
Lead Channel Pacing Threshold Pulse Width: 0.4 ms
Lead Channel Pacing Threshold Pulse Width: 0.4 ms
Lead Channel Sensing Intrinsic Amplitude: 12 mV
Lead Channel Setting Pacing Amplitude: 1.25 V
Lead Channel Setting Pacing Amplitude: 2 V
MDC IDC LEAD IMPLANT DT: 20160701
MDC IDC LEAD LOCATION: 753859
MDC IDC LEAD LOCATION: 753860
MDC IDC MSMT BATTERY REMAINING LONGEVITY: 116 mo
MDC IDC MSMT BATTERY REMAINING PERCENTAGE: 95.5 %
MDC IDC MSMT BATTERY VOLTAGE: 2.99 V
MDC IDC MSMT LEADCHNL RA IMPEDANCE VALUE: 380 Ohm
MDC IDC MSMT LEADCHNL RA PACING THRESHOLD AMPLITUDE: 0.75 V
MDC IDC MSMT LEADCHNL RA SENSING INTR AMPL: 4.2 mV
MDC IDC MSMT LEADCHNL RV PACING THRESHOLD AMPLITUDE: 1 V
MDC IDC PG IMPLANT DT: 20160701
MDC IDC PG SERIAL: 7785935
MDC IDC SET LEADCHNL RV PACING PULSEWIDTH: 0.4 ms
MDC IDC SET LEADCHNL RV SENSING SENSITIVITY: 2 mV
MDC IDC STAT BRADY AP VS PERCENT: 1 %
MDC IDC STAT BRADY AS VS PERCENT: 1 %

## 2018-03-14 NOTE — Progress Notes (Signed)
Discharge Progress Report  Patient Details  Name: Amanda [name] MRN: [id number] Date of Birth: 09/19/1940 Referring Provider:     CARDIAC REHAB PHASE II ORIENTATION from 11/14/2017 in [place]  Referring Provider  Amanda [name]        Number of Visits: 36  Reason for Discharge:  Patient reached a stable level of exercise. Patient independent in their exercise. Patient has met program and personal goals.  Smoking History:  Social History   Tobacco Use  Smoking Status Former Smoker  Smokeless Tobacco Never Used    Diagnosis:  S/P MVR (mitral valve repair)  ADL UCSD:   Initial Exercise Prescription: Initial Exercise Prescription - 11/14/17 1100      Date of Initial Exercise RX and Referring Provider   Date  11/14/17    Referring Provider  Amanda [name]       Recumbant Bike   Level  1.5    Watts  10    Minutes  10    METs  2.52      NuStep   Level  2    SPM  70    Minutes  10    METs  2      Track   Laps  9    Minutes  10    METs  2.53      Prescription Details   Frequency (times per week)  3x    Duration  Progress to 30 minutes of continuous aerobic without signs/symptoms of physical distress      Intensity   THRR 40-80% of Max Heartrate  57-114    Ratings of Perceived Exertion  11-13    Perceived Dyspnea  0-4      Progression   Progression  Continue progressive overload as per policy without signs/symptoms or physical distress.      Resistance Training   Training Prescription  Yes    Weight  2lbs    Reps  10-15       Discharge Exercise Prescription (Final Exercise Prescription Changes): Exercise Prescription Changes - 03/03/18 1400      Response to Exercise   Blood Pressure (Admit)  122/78    Blood Pressure (Exercise)  120/62    Blood Pressure (Exit)  102/64    Heart Rate (Admit)  76 bpm    Heart Rate (Exercise)  80 bpm    Heart Rate (Exit)  73 bpm    Rating of Perceived Exertion  (Exercise)  12    Perceived Dyspnea (Exercise)  0    Symptoms  None     Comments  Pt graduated from Cardiac Rehab     Duration  Continue with 30 min of aerobic exercise without signs/symptoms of physical distress.    Intensity  THRR unchanged      Progression   Progression  Continue to progress workloads to maintain intensity without signs/symptoms of physical distress.    Average METs  3      Resistance Training   Training Prescription  Yes    Weight  3lbs    Reps  10-15    Time  10 Minutes      Interval Training   Interval Training  No      Recumbant Bike   Level  1.5    Watts  15    Minutes  10    METs  2      NuStep   Level  4  SPM  100    Minutes  10    METs  3.78      Track   Laps  16    Minutes  10    METs  3.76      Home Exercise Plan   Plans to continue exercise at  Maintenance    Frequency  Add 3 additional days to program exercise sessions.    Initial Home Exercises Provided  12/18/17       Functional Capacity: 6 Minute Walk    Row Name 11/14/17 1105 02/19/18 1627       6 Minute Walk   Phase  Initial  Discharge    Distance  1200 feet  1698 feet    Distance % Change  -  41.5 %    Distance Feet Change  -  498 ft    Walk Time  6 minutes  6 minutes    # of Rest Breaks  0  0    MPH  2.27  3.21    METS  2.49  3.49    RPE  11  12    Perceived Dyspnea   0  0    VO2 Peak  8.71  12.21    Symptoms  No  No    Resting HR  62 bpm  96 bpm    Resting BP  124/75  108/70    Resting Oxygen Saturation   99 %  -    Exercise Oxygen Saturation  during 6 min walk  100 %  -    Max Ex. HR  81 bpm  111 bpm    Max Ex. BP  134/75  112/72    2 Minute Post BP  123/73  108/64       Psychological, QOL, Others - Outcomes: PHQ 2/9: Depression screen [facility] 2/9 11/18/2017 06/03/2017  Decreased Interest 0 0  Down, Depressed, Hopeless 0 0  PHQ - 2 Score 0 0  Altered sleeping - 0  Tired, decreased energy - 0  Change in appetite - 0  Feeling bad or failure about  yourself  - 0  Trouble concentrating - 0  Moving slowly or fidgety/restless - 0  Suicidal thoughts - 0  PHQ-9 Score - 0  Difficult doing work/chores - Not difficult at all  Some recent data might be hidden    Quality of Life: Quality of Life - 02/19/18 1431      Quality of Life   Select  Quality of Life      Quality of Life Scores   Health/Function Pre  25.29 %    Health/Function Post  25.65 %    Health/Function % Change  1.42 %    Socioeconomic Pre  30 %    Socioeconomic Post  29 %    Socioeconomic % Change   -3.33 %    Psych/Spiritual Pre  29.14 %    Psych/Spiritual Post  29.58 %    Psych/Spiritual % Change  1.51 %    Family Pre  28.8 %    Family Post  28.5 %    Family % Change  -1.04 %    GLOBAL Pre  27.56 %    GLOBAL Post  27.55 %    GLOBAL % Change  -0.04 %       Personal Goals: Goals established at orientation with interventions provided to work toward goal. Personal Goals and Risk Factors at Admission - 11/14/17 1116      Core Components/Risk Factors/Patient  Goals on Admission    Weight Management  Yes        Personal Goals Discharge: Goals and Risk Factor Review    Row Name 11/21/17 1218 12/19/17 0745 01/15/18 1111 02/12/18 1502 03/14/18 1408     Core Components/Risk Factors/Patient Goals Review   Personal Goals Review  Weight Management/Obesity  Weight Management/Obesity  Weight Management/Obesity  Weight Management/Obesity  Weight Management/Obesity   Review  Pt with few CAD RF eager to participate in CR Program.  Amanda [name] is off to a good start with exercise so far. Pt requesting to speak to pharmacist about medication concerns.  Consult placed to facilitate this.   Pt with few CAD RF eager to participate in [place] spoke with a pharmacist regarding medication concerns.  Amanda [name] feels as if she is stronger and has more energy.  Amanda [name] stated that her family has noted this as well.   Pt with few CAD RF eager to participate in [place] has not reported any further concerns about her medications.  She continues to gain strength.  Pt with few CAD RF eager to participate in [place] continues to report increased strength.  She is doing well with exercise. She has expressed interest in participating in the maintenance program upon graduation.   Pt with few CAD RF eager to participate in [place].  Mylan has completed the CR Program with 36 sessions.  She tolerated exercise well.  She felt that she had increased her strength during her exercise.    Expected Outcomes  Pt will continue to participate in exercise, nutrition, and lifestyle modification.   Pt will continue to participate in exercise, nutrition, and lifestyle modification.   Pt will continue to participate in exercise, nutrition, and lifestyle modification.   Pt will continue to participate in exercise, nutrition, and lifestyle modification.   Pt will continue to participate in exercise, nutrition, and lifestyle modification.  Maycie plans to participate in the maintenance program.       Exercise Goals and Review: Exercise Goals    Row Name 11/14/17 0948             Exercise Goals   Increase Physical Activity  Yes       Intervention  Provide advice, education, support and counseling about physical activity/exercise needs.;Develop an individualized exercise prescription for aerobic and resistive training based on initial evaluation findings, risk stratification, comorbidities and participant's personal goals.       Expected Outcomes  Short Term: Attend rehab on a regular basis to increase amount of physical activity.;Long Term: Add in home exercise to make exercise part of routine and to increase amount of physical activity.;Long Term: Exercising regularly at least 3-5 days a week.       Increase Strength and Stamina  Yes return to gardening and yardwork       Intervention  Provide advice, education, support and counseling about physical  activity/exercise needs.;Develop an individualized exercise prescription for aerobic and resistive training based on initial evaluation findings, risk stratification, comorbidities and participant's personal goals.       Expected Outcomes  Short Term: Increase workloads from initial exercise prescription for resistance, speed, and METs.;Short Term: Perform resistance training exercises routinely during rehab and add in resistance training at home;Long Term: Improve cardiorespiratory fitness, muscular endurance and strength as measured by increased METs and functional capacity (6MWT)       Able to understand and use rate of perceived exertion (RPE) scale  Yes       Intervention  Provide education and explanation on how to use RPE scale       Expected Outcomes  Short Term: Able to use RPE daily in rehab to express subjective intensity level;Long Term:  Able to use RPE to guide intensity level when exercising independently       Intervention  Provide education and explanation on how to use Dyspnea scale       Expected Outcomes  Short Term: Able to use Dyspnea scale daily in rehab to express subjective sense of shortness of breath during exertion;Long Term: Able to use Dyspnea scale to guide intensity level when exercising independently       Knowledge and understanding of Target Heart Rate Range (THRR)  Yes       Intervention  Provide education and explanation of THRR including how the numbers were predicted and where they are located for reference       Expected Outcomes  Short Term: Able to state/look up THRR;Long Term: Able to use THRR to govern intensity when exercising independently;Short Term: Able to use daily as guideline for intensity in rehab       Able to check pulse independently  Yes       Intervention  Provide education and demonstration on how to check pulse in carotid and radial arteries.;Review the importance of being able to check your own pulse for safety during independent exercise        Expected Outcomes  Short Term: Able to explain why pulse checking is important during independent exercise;Long Term: Able to check pulse independently and accurately       Understanding of Exercise Prescription  Yes       Intervention  Provide education, explanation, and written materials on patient's individual exercise prescription       Expected Outcomes  Short Term: Able to explain program exercise prescription;Long Term: Able to explain home exercise prescription to exercise independently          Nutrition & Weight - Outcomes: Pre Biometrics - 11/14/17 1136      Pre Biometrics   Waist Circumference  32 inches    Hip Circumference  39 inches    Waist to Hip Ratio  0.82 %    Triceps Skinfold  20 mm    % Body Fat  33.3 %    Grip Strength  20 kg    Flexibility  0 in    Single Leg Stand  4.3 seconds      Post Biometrics - 02/19/18 1625       Post  Biometrics   Height  '5\' 7"'  (1.702 m)    Weight  60.6 kg    Waist Circumference  31 inches    Hip Circumference  38 inches    Waist to Hip Ratio  0.82 %    BMI (Calculated)  20.92    Triceps Skinfold  20 mm    % Body Fat  32.8 %    Grip Strength  19 kg    Flexibility  0 in    Single Leg Stand  4.91 seconds       Nutrition: Nutrition Therapy & Goals - 11/14/17 1207      Nutrition Therapy   Diet  General, healthful    Drug/Food Interactions  Coumadin/Vit K      Personal Nutrition Goals   Nutrition Goal  Pt to liberalize diet to promote energy intake until appetite and loss of taste improve  Intervention Plan   Intervention  Prescribe, educate and counsel regarding individualized specific dietary modifications aiming towards targeted core components such as weight, hypertension, lipid management, diabetes, heart failure and other comorbidities.;Nutrition handout(s) given to patient.   Consistent vitamin K intake   Expected Outcomes  Short Term Goal: Understand basic principles of dietary content, such as calories, fat,  sodium, cholesterol and nutrients.;Long Term Goal: Adherence to prescribed nutrition plan.       Nutrition Discharge: Nutrition Assessments - 03/14/18 1027      MEDFICTS Scores   Pre Score  39    Post Score  --   pt did not return survey      Education Questionnaire Score: Knowledge Questionnaire Score - 02/19/18 1431      Knowledge Questionnaire Score   Pre Score  21/24    Post Score  17/24       Goals reviewed with patient; copy given to patient.

## 2018-03-17 ENCOUNTER — Encounter (HOSPITAL_COMMUNITY)
Admission: RE | Admit: 2018-03-17 | Discharge: 2018-03-17 | Disposition: A | Discharge status: Home / Self Care | Payer: Self-pay | Source: Ambulatory Visit | Attending: Cardiology | Admitting: Cardiology

## 2018-03-17 DIAGNOSIS — Z952 Presence of prosthetic heart valve: Secondary | ICD-10-CM | POA: Insufficient documentation

## 2018-03-18 ENCOUNTER — Other Ambulatory Visit: Payer: Self-pay | Admitting: Cardiology

## 2018-03-19 ENCOUNTER — Encounter (HOSPITAL_COMMUNITY)
Admission: RE | Admit: 2018-03-19 | Discharge: 2018-03-19 | Disposition: A | Discharge status: Home / Self Care | Payer: Self-pay | Source: Ambulatory Visit | Attending: Cardiology | Admitting: Cardiology

## 2018-03-21 ENCOUNTER — Encounter (HOSPITAL_COMMUNITY)
Admission: RE | Admit: 2018-03-21 | Discharge: 2018-03-21 | Disposition: A | Discharge status: Home / Self Care | Payer: Self-pay | Source: Ambulatory Visit | Attending: Cardiology | Admitting: Cardiology

## 2018-03-24 ENCOUNTER — Encounter (HOSPITAL_COMMUNITY)
Admission: RE | Admit: 2018-03-24 | Discharge: 2018-03-24 | Disposition: A | Discharge status: Home / Self Care | Payer: Self-pay | Source: Ambulatory Visit | Attending: Cardiology | Admitting: Cardiology

## 2018-03-26 ENCOUNTER — Encounter (HOSPITAL_COMMUNITY): Payer: Self-pay

## 2018-03-28 ENCOUNTER — Encounter (HOSPITAL_COMMUNITY)
Admission: RE | Admit: 2018-03-28 | Discharge: 2018-03-28 | Disposition: A | Discharge status: Home / Self Care | Payer: Self-pay | Source: Ambulatory Visit | Attending: Cardiology | Admitting: Cardiology

## 2018-03-31 ENCOUNTER — Ambulatory Visit: Payer: Medicare Other | Admitting: *Deleted

## 2018-03-31 ENCOUNTER — Encounter (HOSPITAL_COMMUNITY): Payer: Self-pay

## 2018-03-31 DIAGNOSIS — Z7901 Long term (current) use of anticoagulants: Secondary | ICD-10-CM | POA: Diagnosis not present

## 2018-03-31 DIAGNOSIS — Z8673 Personal history of transient ischemic attack (TIA), and cerebral infarction without residual deficits: Secondary | ICD-10-CM

## 2018-03-31 DIAGNOSIS — I480 Paroxysmal atrial fibrillation: Secondary | ICD-10-CM | POA: Diagnosis not present

## 2018-03-31 DIAGNOSIS — Z5181 Encounter for therapeutic drug level monitoring: Secondary | ICD-10-CM | POA: Diagnosis not present

## 2018-03-31 LAB — POCT INR: INR: 1.7 — AB (ref 2.0–3.0)

## 2018-03-31 NOTE — Patient Instructions (Signed)
Description   Today take 1.5 tablets then continue same dose of coumadin 1 tablet daily except 2 tablets on Sundays.  Recheck INR in 2 weeks with MD appt. Call us with medication changes or concerns, Main # [phone]. Coumadin Clinic # [phone].

## 2018-04-02 ENCOUNTER — Encounter (HOSPITAL_COMMUNITY): Payer: Self-pay

## 2018-04-03 IMAGING — CR DG CHEST 1V
1 series · 1 of 1 positions shown · non-contrast
Comparison: Chest x-ray of May 13, 2017

CLINICAL DATA: Status post right-sided thoracentesis

EXAM:
CHEST 1 VIEW

[w chest pa]
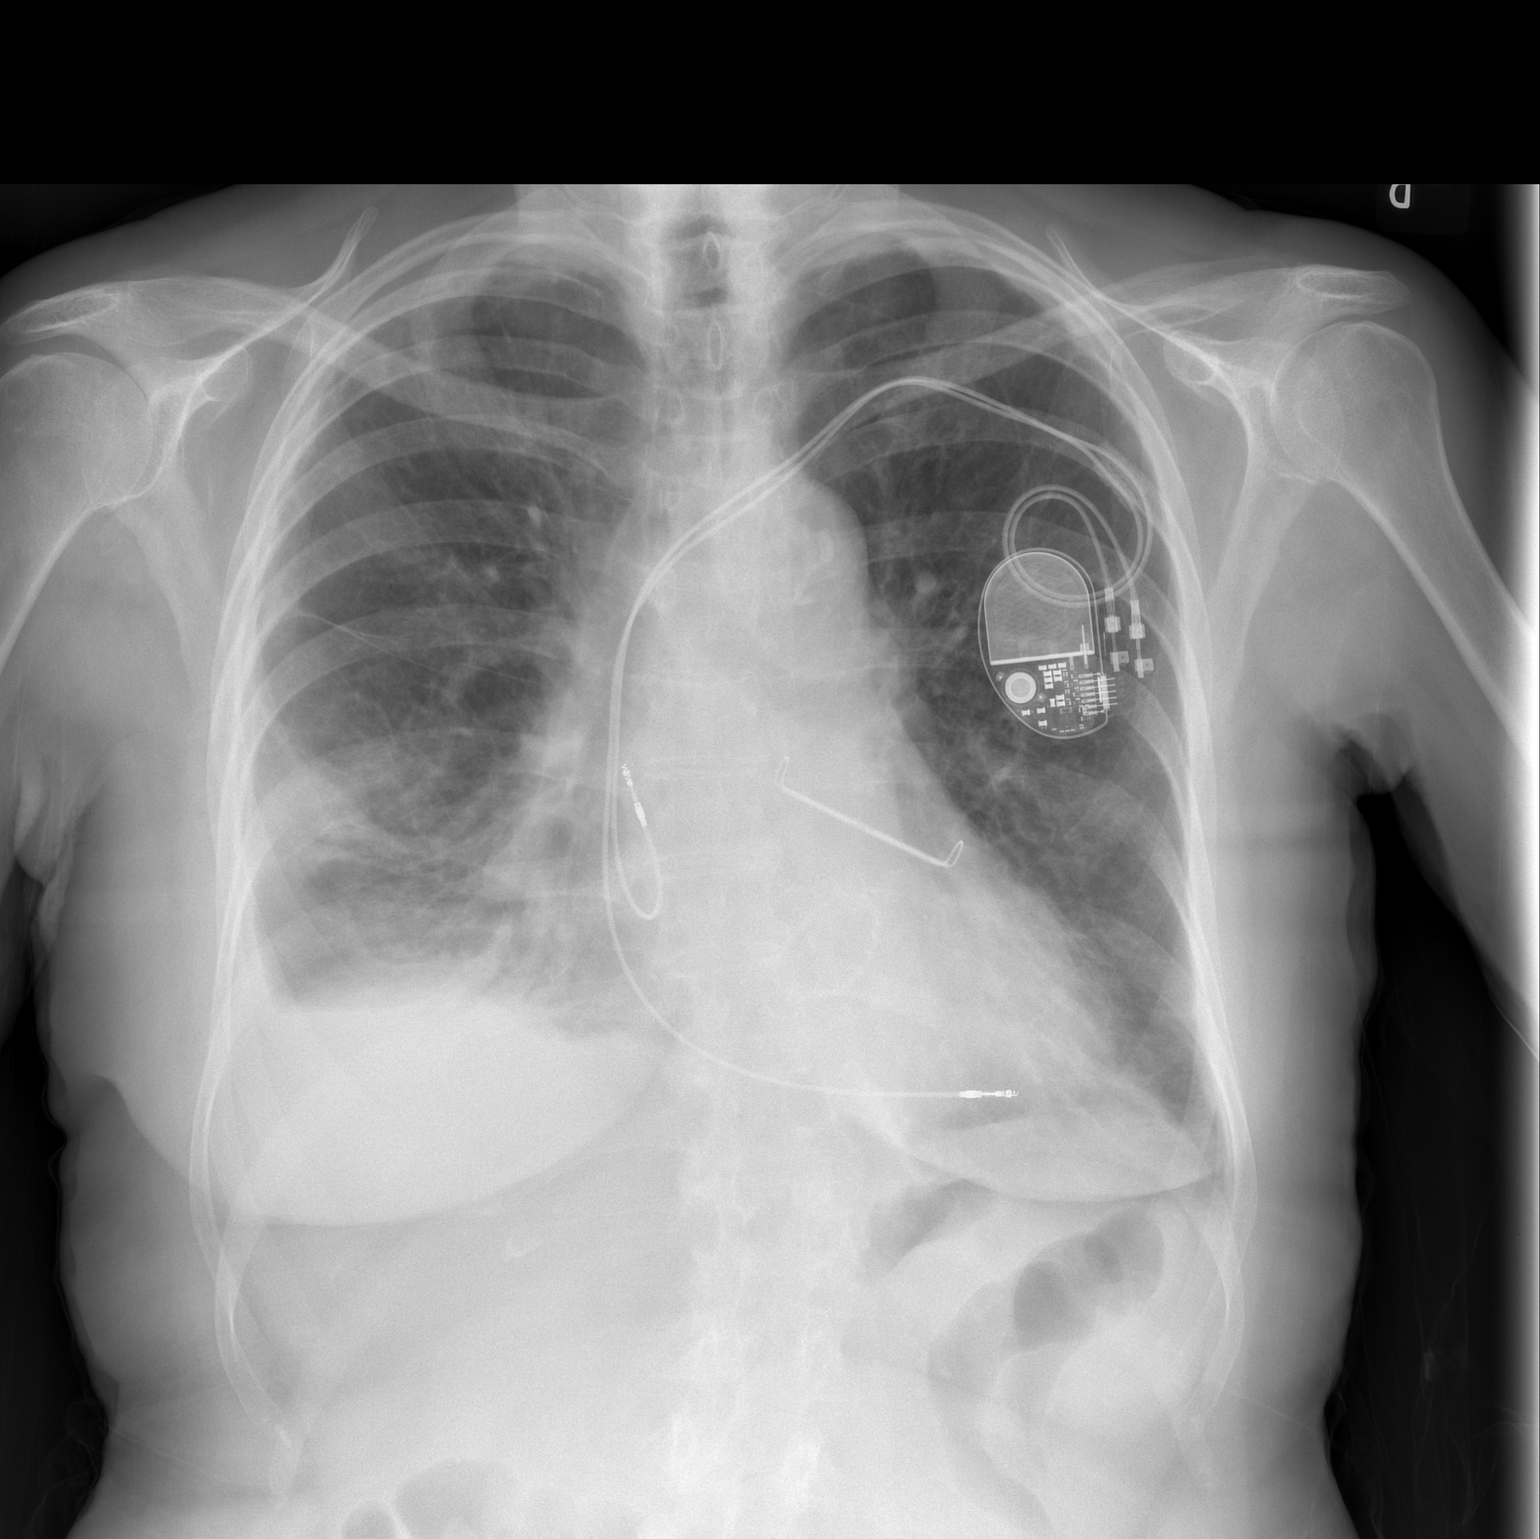

[1 of 1 positions shown; findings below may reference images not displayed]

FINDINGS: There has been considerable reduction in the volume of pleural fluid
on the right since thoracentesis. No postprocedure pneumothorax is
observed. There remain coarse lung markings in the right lower lung.
The left lung clear. The heart and pulmonary vascularity are normal.
A left atrial appendage clip and mitral valve ring are present. The
ICD is in stable position. There is calcification in the wall of the
aortic arch. The bony thorax exhibits no acute abnormality.
IMPRESSION: No postprocedure complication following successful right sided
thoracentesis.

Persistent right basilar atelectasis or infiltrate.

Thoracic aortic atherosclerosis.

## 2018-04-04 ENCOUNTER — Encounter (HOSPITAL_COMMUNITY): Payer: Self-pay

## 2018-04-07 ENCOUNTER — Encounter (HOSPITAL_COMMUNITY)
Admission: RE | Admit: 2018-04-07 | Discharge: 2018-04-07 | Disposition: A | Discharge status: Home / Self Care | Payer: Self-pay | Source: Ambulatory Visit | Attending: Cardiology | Admitting: Cardiology

## 2018-04-09 ENCOUNTER — Encounter (HOSPITAL_COMMUNITY)
Admission: RE | Admit: 2018-04-09 | Discharge: 2018-04-09 | Disposition: A | Discharge status: Home / Self Care | Payer: Self-pay | Source: Ambulatory Visit | Attending: Cardiology | Admitting: Cardiology

## 2018-04-09 DIAGNOSIS — Z952 Presence of prosthetic heart valve: Secondary | ICD-10-CM | POA: Insufficient documentation

## 2018-04-11 ENCOUNTER — Encounter (HOSPITAL_COMMUNITY)
Admission: RE | Admit: 2018-04-11 | Discharge: 2018-04-11 | Disposition: A | Discharge status: Home / Self Care | Payer: Self-pay | Source: Ambulatory Visit | Attending: Cardiology | Admitting: Cardiology

## 2018-04-14 ENCOUNTER — Encounter (HOSPITAL_COMMUNITY): Payer: Self-pay

## 2018-04-16 ENCOUNTER — Ambulatory Visit (INDEPENDENT_AMBULATORY_CARE_PROVIDER_SITE_OTHER): Payer: Medicare Other | Admitting: *Deleted

## 2018-04-16 ENCOUNTER — Ambulatory Visit: Payer: Medicare Other | Admitting: Cardiology

## 2018-04-16 ENCOUNTER — Encounter

## 2018-04-16 ENCOUNTER — Encounter: Payer: Self-pay | Admitting: Cardiology

## 2018-04-16 ENCOUNTER — Encounter (HOSPITAL_COMMUNITY): Payer: Self-pay

## 2018-04-16 VITALS — BP 120/70 | HR 78 | Ht 67.0 in | Wt 136.4 lb

## 2018-04-16 DIAGNOSIS — Z7901 Long term (current) use of anticoagulants: Secondary | ICD-10-CM

## 2018-04-16 DIAGNOSIS — I480 Paroxysmal atrial fibrillation: Secondary | ICD-10-CM

## 2018-04-16 DIAGNOSIS — I340 Nonrheumatic mitral (valve) insufficiency: Secondary | ICD-10-CM

## 2018-04-16 DIAGNOSIS — Z8673 Personal history of transient ischemic attack (TIA), and cerebral infarction without residual deficits: Secondary | ICD-10-CM | POA: Diagnosis not present

## 2018-04-16 DIAGNOSIS — Z5181 Encounter for therapeutic drug level monitoring: Secondary | ICD-10-CM | POA: Diagnosis not present

## 2018-04-16 DIAGNOSIS — I10 Essential (primary) hypertension: Secondary | ICD-10-CM

## 2018-04-16 DIAGNOSIS — I493 Ventricular premature depolarization: Secondary | ICD-10-CM | POA: Diagnosis not present

## 2018-04-16 DIAGNOSIS — I4430 Unspecified atrioventricular block: Secondary | ICD-10-CM

## 2018-04-16 LAB — POCT INR: INR: 2 (ref 2.0–3.0)

## 2018-04-16 NOTE — Progress Notes (Signed)
Cardiology Office Note:    Date:  04/16/2018   ID:  Amanda [name], DOB 1941-04-21, MRN [id number]  PCP:  [name], MD  Cardiologist:  No primary care provider on file.    Referring MD: [name], MD   Chief Complaint  Patient presents with  . Mitral Regurgitation  . Hypertension  . Atrial Fibrillation    History of Present Illness:    Amanda [name] is a 77 y.o. female with a hx of s/p minimally invasive mitral valve repair and maze procedure 04/16/2017.  She had a complicated course requiring inpatient rehab and right thoracentesis.  She then underwent right Pleurx catheter for recurrence.  Also has history of PACs, PVCs, PAF, hypertension.  CHA2DS2-VASc 4 on NOAC.  She is here today for followup and is doing well.  She denies any chest pain or pressure, SOB, DOE, PND, orthopnea, LE edema,  palpitations or syncope.  Her daughter is concerned that she is having some low BPs at home with some mild dizziness after she takes her meds.    Past Medical History:  Diagnosis Date  . Allergic rhinitis   . Anemia   . Asymptomatic LV dysfunction    EF 45-50% echo 07/2017  . CKD (chronic kidney disease), stage III (HCC)    stage III  . DJD (degenerative joint disease)   . Glomerulonephritis    Dr [name]  . Hyperlipidemia   . Hyperparathyroidism (HCC)   . Hypertension   . Hypothyroidism   . IBS (irritable bowel syndrome)   . Interstitial cystitis   . Mitral regurgitation   . Mobitz II    a. s/p STJ dual chamber PPM   . MVP (mitral valve prolapse)    moderate posterior MVP with moderate MR and grade II diasotlic dysfunction  . PAC (premature atrial contraction)   . PAF (paroxysmal atrial fibrillation) (HCC)     note on pacer check. CHADS2VASC score is 4 now on Eliquis.  [name] PVC's (premature ventricular contractions)   . S/P minimally invasive maze operation for atrial fibrillation 04/16/2017   Complete bilateral atrial lesion set using cryothermy and bipolar  radiofrequency ablation with clipping of LA appendage via right mini thoracotomy approach  . S/P minimally invasive mitral valve repair 04/16/2017   Complex valvuloplasty including triangular resection of posterior leaflet, artificial Gore-tex neochords x6 and Sorin Memo 3D ring annuloplasty ([id number], size 32, serial # [id number])  . S/P placement of cardiac pacemaker   . Small vessel disease, cerebrovascular   . Stroke [facility])    a. old stroke seen on imaging.    Past Surgical History:  Procedure Laterality Date  . ABDOMINAL HYSTERECTOMY    . BREAST BIOPSY Right   . CHEST TUBE INSERTION Right 05/23/2017   Procedure: INSERTION PLEURAL DRAINAGE CATHETER;  Surgeon: Purcell Nails, MD;  Location: [facility] OR;  Service: Thoracic;  Laterality: Right;  possible pleurex catheter  . CLIPPING OF ATRIAL APPENDAGE  04/16/2017   Procedure: CLIPPING OF ATRIAL APPENDAGE using AtriCure Pro2 clip 45;  Surgeon: Purcell Nails, MD;  Location: [facility] OR;  Service: Open Heart Surgery;;  . CYSTOSTOMY W/ BLADDER BIOPSY    . EP IMPLANTABLE DEVICE N/A 01/07/2015   STJ dual chamber pacemaker implanted by Dr [name] for 2:1 heart block  . MINIMALLY INVASIVE MAZE PROCEDURE N/A 04/16/2017   Procedure: MINIMALLY INVASIVE MAZE PROCEDURE;  Surgeon: Purcell Nails, MD;  Location: [facility] OR;  Service: Open Heart Surgery;  Laterality: N/A;  . MITRAL VALVE REPAIR  Right 04/16/2017   Procedure: MINIMALLY INVASIVE MITRAL VALVE REPAIR (MVR);  Surgeon: Purcell Nails, MD;  Location: [facility] OR;  Service: Open Heart Surgery;  Laterality: Right;  . MITRAL VALVE REPAIR Right 04/16/2017   Procedure: RE-EXPLORATION RIGHT THORACOTOMY FOR BLEEDING;  Surgeon: Purcell Nails, MD;  Location: [facility] OR;  Service: Open Heart Surgery;  Laterality: Right;  . PERIPHERAL VASCULAR CATHETERIZATION N/A 02/03/2015   Procedure: Upper Extremity Venography;  Surgeon: [name], MD;  Location: [facility] INVASIVE CV LAB;  Service: Cardiovascular;  Laterality: N/A;  . REMOVAL OF  PLEURAL DRAINAGE CATHETER Right 09/03/2017   Procedure: REMOVAL OF PLEURAL DRAINAGE CATHETER;  Surgeon: Purcell Nails, MD;  Location: [facility] OR;  Service: Thoracic;  Laterality: Right;  . RIGHT/LEFT HEART CATH AND CORONARY ANGIOGRAPHY N/A 03/26/2017   Procedure: RIGHT/LEFT HEART CATH AND CORONARY ANGIOGRAPHY;  Surgeon: [name], MD;  Location: [facility] INVASIVE CV LAB;  Service: Cardiovascular;  Laterality: N/A;  . TEE WITHOUT CARDIOVERSION N/A 03/18/2017   Procedure: TRANSESOPHAGEAL ECHOCARDIOGRAM (TEE);  Surgeon: [name], MD;  Location: [facility] ENDOSCOPY;  Service: Cardiovascular;  Laterality: N/A;  . TEE WITHOUT CARDIOVERSION N/A 04/16/2017   Procedure: TRANSESOPHAGEAL ECHOCARDIOGRAM (TEE);  Surgeon: Purcell Nails, MD;  Location: [facility] OR;  Service: Open Heart Surgery;  Laterality: N/A;  . TONSILLECTOMY      Current Medications: Current Meds  Medication Sig  . acetaminophen (TYLENOL) 500 MG tablet Take 500 mg by mouth every 6 (six) hours as needed.   . AMILORIDE-HYDROCHLOROTHIAZIDE PO Take 2.5-25 mg by mouth daily.  . calcitRIOL (ROCALTROL) 0.25 MCG capsule Take 0.25 mcg by mouth every 3 (three) days. In the morning   . conjugated estrogens (PREMARIN) vaginal cream Place 1 Applicatorful vaginally at bedtime.   . fexofenadine (ALLEGRA) 180 MG tablet Take 180 mg by mouth daily.  . fluticasone (FLONASE) 50 MCG/ACT nasal spray Place 2 sprays into both nostrils daily.   [name] levothyroxine (SYNTHROID, LEVOTHROID) 75 MCG tablet Take 75 mcg by mouth daily.  . metoprolol tartrate (LOPRESSOR) 25 MG tablet Take 1 tablet (25 mg total) by mouth 2 (two) times daily.  . Probiotic Product (PROBIOTIC DAILY PO) Take 1 capsule by mouth 2 (two) times a week. In the morning  . vitamin B-12 (CYANOCOBALAMIN) 1000 MCG tablet Take 1,000 mcg by mouth once a week.  . warfarin (COUMADIN) 1 MG tablet TAKE ONE TABLET DAILY  . [DISCONTINUED] aMILoride (MIDAMOR) 5 MG tablet Take 2.5 mg by mouth daily.     Allergies:    Pepcid [famotidine]; Allopurinol; Amlodipine; Atorvastatin; Contrast media [iodinated diagnostic agents]; Cephalexin; Ciprofloxacin; Colchicine; Erythromycin; Prilosec [omeprazole]; Sulfonamide derivatives; and Tetracycline   Social History   Socioeconomic History  . Marital status: Married    Spouse name: Not on file  . Number of children: 2  . Years of education: Not on file  . Highest education level: Not on file  Occupational History  . Occupation: retired  Engineer, production  . Financial resource strain: Not on file  . Food insecurity:    Worry: Not on file    Inability: Not on file  . Transportation needs:    Medical: Not on file    Non-medical: Not on file  Tobacco Use  . Smoking status: Former Games developer  . Smokeless tobacco: Never Used  Substance and Sexual Activity  . Alcohol use: No  . Drug use: No  . Sexual activity: Not on file  Lifestyle  . Physical activity:    Days per week: Not  on file    Minutes per session: Not on file  . Stress: Not on file  Relationships  . Social connections:    Talks on phone: Not on file    Gets together: Not on file    Attends religious service: Not on file    Active member of club or organization: Not on file    Attends meetings of clubs or organizations: Not on file    Relationship status: Not on file  Other Topics Concern  . Not on file  Social History Narrative   Patient drinks 1-2 cups of caffeine daily.   Patient is right handed.      Family History: The patient's family history includes Brain cancer in her brother; Heart attack in her brother and father; Heart disease in her brother, father, and mother; Hypertension in her brother, mother, and sister; Melanoma in her brother; Ovarian cancer in her sister; Rheum arthritis in her sister; Stroke in her mother. There is no history of Colon cancer.  ROS:   Please see the history of present illness.    ROS  All other systems reviewed and negative.   EKGs/Labs/Other Studies  Reviewed:    The following studies were reviewed today: none  EKG:  EKG is not ordered today.   Recent Labs: 04/22/2017: Magnesium 1.9 05/17/2017: TSH 2.010 05/22/2017: ALT 16 05/23/2017: Hemoglobin 10.3; Platelets 233 05/24/2017: BUN 9; Creatinine, Ser 1.26; Potassium 3.2; Sodium 137   Recent Lipid Panel    Component Value Date/Time   CHOL 153 01/07/2015 0304   TRIG 197 (H) 04/22/2017 0419   HDL 51 01/07/2015 0304   CHOLHDL 3.0 01/07/2015 0304   VLDL 13 01/07/2015 0304   LDLCALC 89 01/07/2015 0304    Physical Exam:    VS:  BP 120/70   Pulse 78   Ht 5\' 7"  (1.702 m)   Wt 136 lb 6.4 oz (61.9 kg)   SpO2 98%   BMI 21.36 kg/m     Wt Readings from Last 3 Encounters:  04/16/18 136 lb 6.4 oz (61.9 kg)  03/11/18 133 lb 1.9 oz (60.4 kg)  02/26/18 136 lb (61.7 kg)     GEN:  Well nourished, well developed in no acute distress HEENT: Normal NECK: No JVD; No carotid bruits LYMPHATICS: No lymphadenopathy CARDIAC: RRR, no murmurs, rubs, gallops RESPIRATORY:  Clear to auscultation without rales, wheezing or rhonchi  ABDOMEN: Soft, non-tender, non-distended MUSCULOSKELETAL:  No edema; No deformity  SKIN: Warm and dry NEUROLOGIC:  Alert and oriented x 3 PSYCHIATRIC:  Normal affect   ASSESSMENT:    1. Paroxysmal atrial fibrillation (HCC)   2. Essential hypertension   3. Nonrheumatic mitral valve regurgitation   4. PVC's (premature ventricular contractions)   5. AV block    PLAN:    In order of problems listed above:  1.  PAF - s/p Maze procedure at the time MV repair.  She denies any palpitations and is maintaining NSR on exam today.  She will continue on warfarin and lopressor 25mg  BID.  2.  HTN - BP is well controlled on exam today.  She will continue on lopressor 25mg  BID.  Her daughter is concerned that she is having some low BPs at home with some mild dizziness after she takes her meds.  I have asked her to speak with Dr. [name] concerning stopping the Amioloride  HCT to see if things improve.  Would like to continue on BB if possible to prevent future PAF.  3.  Severe  MR - s/p minimally invasive MV repair.  Echo 02/2018 showd normal LVF with trivial MR  4.  PVCs - followed on pacer checks.  She had 3.4% PVC load on pacer check in August. She is asymptomatic.   5.  Heart block s/p PPM - followed in device clinic.    Medication Adjustments/Labs and Tests Ordered: Current medicines are reviewed at length with the patient today.  Concerns regarding medicines are outlined above.  No orders of the defined types were placed in this encounter.  No orders of the defined types were placed in this encounter.   Signed, Armanda Magic, MD  04/16/2018 8:39 AM    [facility]

## 2018-04-16 NOTE — Patient Instructions (Signed)
Description   Today take 1.5 tablets then continue same dose of coumadin 1 tablet daily except 2 tablets on Sundays.  Recheck INR in 3 weeks. Call us with medication changes or concerns, Main # [phone]. Coumadin Clinic # [phone].

## 2018-04-16 NOTE — Patient Instructions (Signed)
Medication Instructions:  Your physician recommends that you continue on your current medications as directed. Please refer to the Current Medication list given to you today.  If you need a refill on your cardiac medications before your next appointment, please call your pharmacy.   Take blood pressure daily for 1 week and call with results  Lab work: If you have labs (blood work) drawn today and your tests are completely normal, you will receive your results only by: [name] MyChart Message (if you have MyChart) OR . A paper copy in the mail If you have any lab test that is abnormal or we need to change your treatment, we will call you to review the results.  Follow-Up: At [facility], you and your health needs are our priority.  As part of our continuing mission to provide you with exceptional heart care, we have created designated Provider Care Teams.  These Care Teams include your primary Cardiologist (physician) and Advanced Practice Providers (APPs -  Physician Assistants and Nurse Practitioners) who all work together to provide you with the care you need, when you need it.  You will need a follow up appointment in 6 months.  Please call our office 2 months in advance to schedule this appointment.  You may see [name] or one of the following Advanced Practice Providers on your designated Care Team:   [place], PA-C Ronie Spies, PA-C . Jacolyn Reedy, PA-C

## 2018-04-18 ENCOUNTER — Encounter (HOSPITAL_COMMUNITY)
Admission: RE | Admit: 2018-04-18 | Discharge: 2018-04-18 | Disposition: A | Discharge status: Home / Self Care | Payer: Self-pay | Source: Ambulatory Visit | Attending: Cardiology | Admitting: Cardiology

## 2018-04-21 ENCOUNTER — Ambulatory Visit: Payer: Medicare Other | Admitting: Thoracic Surgery (Cardiothoracic Vascular Surgery)

## 2018-04-21 ENCOUNTER — Encounter (HOSPITAL_COMMUNITY)
Admission: RE | Admit: 2018-04-21 | Discharge: 2018-04-21 | Disposition: A | Discharge status: Home / Self Care | Payer: Medicare Other | Source: Ambulatory Visit | Attending: Cardiology | Admitting: Cardiology

## 2018-04-21 ENCOUNTER — Encounter: Payer: Self-pay | Admitting: Thoracic Surgery (Cardiothoracic Vascular Surgery)

## 2018-04-21 VITALS — BP 134/74 | HR 75 | Resp 20 | Ht 67.0 in | Wt 136.0 lb

## 2018-04-21 DIAGNOSIS — Z8679 Personal history of other diseases of the circulatory system: Secondary | ICD-10-CM | POA: Diagnosis not present

## 2018-04-21 DIAGNOSIS — Z9889 Other specified postprocedural states: Secondary | ICD-10-CM

## 2018-04-21 NOTE — Progress Notes (Signed)
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 Her postoperative convalescence was somewhat slow, and she underwent right Pleurx catheter placement on May 23, 2017 for recurrent pleural effusion. Her pleural effusion resolved and the catheter was removed, and she was last seen in follow-up in our office on September 23, 2017.  Since then she has done quite well from a cardiac standpoint.  Routine follow-up echocardiogram performed February 21, 2018 revealed normal left ventricular systolic function with intact mitral valve repair and trivial residual mitral regurgitation.  She has remained in sinus rhythm.  She remains chronically anticoagulated using warfarin and she was seen recently by [name].  She returns her office today and reports that overall she is doing quite well.  However, she complains that over the past 6 months she seems to have decreased energy.  She continues to exercise at least 3 days a week and she has done very well with this.  She specifically denies exertional shortness of breath or chest discomfort.  She has not had any palpitations or other symptoms to suggest a  recurrence of atrial fibrillation.  Her only complaint is that of chronic fatigue.  She is uncertain as to whether or not her thyroid hormone level has been checked recently.  She hopes that her blood pressure medicines can be decreased or stopped.   Current Outpatient Medications  Medication Sig Dispense Refill  . acetaminophen (TYLENOL) 500 MG tablet Take 500 mg by mouth every 6 (six) hours as needed.     . AMILORIDE-HYDROCHLOROTHIAZIDE PO Take 2.5-25 mg by mouth daily.    . calcitRIOL (ROCALTROL) 0.25 MCG capsule Take 0.25 mcg by mouth every 3 (three) days. In the morning     . conjugated estrogens (PREMARIN) vaginal cream Place 1 Applicatorful vaginally at bedtime.     . fexofenadine (ALLEGRA) 180 MG tablet Take 180 mg by mouth daily.    . fluticasone (FLONASE) 50 MCG/ACT nasal spray Place 2 sprays into both nostrils daily.     [name] levothyroxine (SYNTHROID, LEVOTHROID) 75 MCG tablet Take 75 mcg by mouth daily.    . metoprolol tartrate (LOPRESSOR) 25 MG tablet Take 1 tablet (25 mg total) by mouth 2 (two) times daily. 180 tablet 3  . Probiotic Product (PROBIOTIC DAILY PO) Take 1 capsule by mouth 2 (two) times a week. In the morning    . vitamin B-12 (CYANOCOBALAMIN) 1000 MCG tablet Take 1,000 mcg by mouth once a week.    . warfarin (COUMADIN) 1 MG tablet TAKE ONE TABLET DAILY 40 tablet 3   No current facility-administered medications for this visit.       Physical Exam:   BP 134/74   Pulse 75   Resp 20   Ht 5\' 7"  (  1.702 m)   Wt 136 lb (61.7 kg)   SpO2 98% Comment: RA  BMI 21.30 kg/m   General:  Well-appearing  Chest:   Clear to auscultation  CV:   Regular rate and rhythm without murmur  Incisions:  Completely healed  Abdomen:  Soft nontender  Extremities:  Warm and well-perfused, no edema  Diagnostic Tests:  Transthoracic Echocardiography  Patient:    Amanda [name], Vowell MR #:       [id number] Study Date: 02/21/2018 Gender:     F Age:        13 Height:     170.2  cm Weight:     60.6 kg BSA:        1.69 m^2 Pt. Status: Room:   ORDERING     Armanda Magic, MD  REFERRING    Armanda Magic, MD  SONOGRAPHER  Cathie Beams  ATTENDING    Donato Schultz, M.D.  PERFORMING   [facility], Outpatient  cc:  ------------------------------------------------------------------- LV EF: 55% -   60%  ------------------------------------------------------------------- Indications:      R93.1 Decreased cardiac ejection fraction.  ------------------------------------------------------------------- History:   PMH:  Mitral valve repair. PACs.  Atrial fibrillation. Mitral valve prolapse.  Stroke.  Risk factors:  Hypertension. Dyslipidemia.  ------------------------------------------------------------------- Study Conclusions  - Procedure narrative: Transthoracic echocardiography. Image   quality was adequate. The study was technically difficult. - Left ventricle: The cavity size was normal. Wall thickness was   increased in a pattern of mild LVH. Systolic function was normal.   The estimated ejection fraction was in the range of 55% to 60%.   Possible hypokinesis of the apical myocardium. The study is not   technically sufficient to allow evaluation of LV diastolic   function. - Mitral valve: Annuloplasty ring s/p MV repair. Normal function.   There was trivial regurgitation. - Right ventricle: Pacer wire or catheter noted in right ventricle.  ------------------------------------------------------------------- Study data:  Comparison was made to the study of 08/14/2017.  Study status:  Routine.  Procedure:  The patient reported no pain pre or post test. Transthoracic echocardiography. Image quality was adequate. The study was technically difficult.  Study completion: There were no complications.          Transthoracic echocardiography.  M-mode, complete 2D, spectral Doppler, and color Doppler.  Birthdate:  Patient birthdate: 1941/06/14.  Age:  Patient is 77  yr old.  Sex:  Gender: female.    BMI: 20.9 kg/m^2.  Blood pressure:     125/78  Patient status:  Outpatient.  Study date: Study date: 02/21/2018. Study time: 12:47 PM.  Location:  [facility]  -------------------------------------------------------------------  ------------------------------------------------------------------- Left ventricle:  The cavity size was normal. Wall thickness was increased in a pattern of mild LVH. Systolic function was normal. The estimated ejection fraction was in the range of 55% to 60%. Regional wall motion abnormalities:   Possible hypokinesis of the apical myocardium. The study is not technically sufficient to allow evaluation of LV diastolic function.  ------------------------------------------------------------------- Aortic valve:   Trileaflet; normal thickness leaflets. Mobility was not restricted.  Doppler:  Transvalvular velocity was within the normal range. There was no stenosis. There was no regurgitation.   ------------------------------------------------------------------- Aorta:  Aortic root: The aortic root was normal in size.  ------------------------------------------------------------------- Mitral valve:  Annuloplasty ring s/p MV repair. Normal function. Structurally normal valve.   Mobility was not restricted.  Doppler:  Transvalvular velocity was within the normal range. There was no evidence for stenosis. There was trivial regurgitation.  -------------------------------------------------------------------  Left atrium:  The atrium was normal in size.  ------------------------------------------------------------------- Right ventricle:  The cavity size was normal. Wall thickness was normal. Pacer wire or catheter noted in right ventricle. Systolic function was normal.  ------------------------------------------------------------------- Pulmonic valve:   Poorly visualized.  Structurally normal valve. Cusp  separation was normal.  Doppler:  Transvalvular velocity was within the normal range. There was no evidence for stenosis. There was no regurgitation.  ------------------------------------------------------------------- Tricuspid valve:   Structurally normal valve.    Doppler: Transvalvular velocity was within the normal range. There was no regurgitation.  ------------------------------------------------------------------- Pulmonary artery:   The main pulmonary artery was normal-sized. Systolic pressure was within the normal range.  ------------------------------------------------------------------- Right atrium:  The atrium was normal in size.  ------------------------------------------------------------------- Pericardium:  There was no pericardial effusion.  ------------------------------------------------------------------- Systemic veins: Inferior vena cava: The vessel was normal in size.  ------------------------------------------------------------------- Measurements   Left ventricle                         Value        Reference  LV ID, ED, PLAX chordal        (L)     35    mm     43 - 52  LV ID, ES, PLAX chordal        (L)     22.1  mm     23 - 38  LV fx shortening, PLAX chordal         37    %      >=29  LV PW thickness, ED                    12.1  mm     ---------  IVS/LV PW ratio, ED                    0.95         <=1.3    Ventricular septum                     Value        Reference  IVS thickness, ED                      11.5  mm     ---------    LVOT                                   Value        Reference  LVOT ID, S                             20    mm     ---------  LVOT area                              3.14  cm^2   ---------    Aorta                                  Value        Reference  Aortic root ID, ED  30    mm     ---------    Left atrium                            Value        Reference  LA ID, A-P, ES                          34    mm     ---------  LA ID/bsa, A-P                         2.01  cm/m^2 <=2.2  LA volume, ES, 1-p A4C                 24.4  ml     ---------  LA volume/bsa, ES, 1-p A4C             14.4  ml/m^2 ---------    Pulmonary arteries                     Value        Reference  PA pressure, S, DP                     25    mm Hg  <=30    Tricuspid valve                        Value        Reference  Tricuspid regurg peak velocity         232   cm/s   ---------  Tricuspid peak RV-RA gradient          22    mm Hg  ---------    Systemic veins                         Value        Reference  Estimated CVP                          3     mm Hg  ---------    Right ventricle                        Value        Reference  RV pressure, S, DP                     25    mm Hg  <=30  RV s&', lateral, S                      11.2  cm/s   ---------    Pulmonic valve                         Value        Reference  Pulmonic regurg velocity, ED           97.7  cm/s   ---------  Legend: (L)  and  (H)  mark values outside specified reference range.  ------------------------------------------------------------------- Prepared and Electronically Authenticated by  Donato Schultz, M.D. 2019-08-16T14:43:09   2 channel telemetry rhythm strip demonstrates atrial paced rhythm   Impression:  Patient appears  to be doing quite well and maintaining sinus rhythm (paced rhythm) approximately 1 year status post minimally invasive mitral valve repair and Maze procedure.  She denies symptoms of congestive heart failure.  She remains chronically anticoagulated using warfarin.  Plan:  We have not recommended any change the patient's current medications.  The patient has been reminded regarding the importance of dental hygiene and the lifelong need for antibiotic prophylaxis for all dental cleanings and other related invasive procedures.  She will continue to follow-up regularly with [name] and Dr. [name].  In  the future she will call and return to see [place] only should specific problems or questions arise.  I spent in excess of 15 minutes during the conduct of this office consultation and >50% of this time involved direct face-to-face encounter with the patient for counseling and/or coordination of their care.    Salvatore Decent. Cornelius Moras, MD 04/21/2018 12:16 PM

## 2018-04-21 NOTE — Patient Instructions (Signed)

## 2018-04-23 ENCOUNTER — Encounter (HOSPITAL_COMMUNITY)
Admission: RE | Admit: 2018-04-23 | Discharge: 2018-04-23 | Disposition: A | Discharge status: Home / Self Care | Payer: Medicare Other | Source: Ambulatory Visit | Attending: Cardiology | Admitting: Cardiology

## 2018-04-25 ENCOUNTER — Encounter (HOSPITAL_COMMUNITY)
Admission: RE | Admit: 2018-04-25 | Discharge: 2018-04-25 | Disposition: A | Discharge status: Home / Self Care | Payer: Medicare Other | Source: Ambulatory Visit | Attending: Cardiology | Admitting: Cardiology

## 2018-04-28 ENCOUNTER — Encounter (HOSPITAL_COMMUNITY)
Admission: RE | Admit: 2018-04-28 | Discharge: 2018-04-28 | Disposition: A | Discharge status: Home / Self Care | Payer: Self-pay | Source: Ambulatory Visit | Attending: Cardiology | Admitting: Cardiology

## 2018-04-30 ENCOUNTER — Encounter (HOSPITAL_COMMUNITY)
Admission: RE | Admit: 2018-04-30 | Discharge: 2018-04-30 | Disposition: A | Discharge status: Home / Self Care | Payer: Medicare Other | Source: Ambulatory Visit | Attending: Cardiology | Admitting: Cardiology

## 2018-05-02 ENCOUNTER — Encounter (HOSPITAL_COMMUNITY): Payer: Self-pay

## 2018-05-05 ENCOUNTER — Encounter (HOSPITAL_COMMUNITY): Payer: Self-pay

## 2018-05-07 ENCOUNTER — Encounter (HOSPITAL_COMMUNITY): Payer: Self-pay

## 2018-05-09 ENCOUNTER — Encounter (HOSPITAL_COMMUNITY): Payer: Self-pay

## 2018-05-09 DIAGNOSIS — Z952 Presence of prosthetic heart valve: Secondary | ICD-10-CM | POA: Insufficient documentation

## 2018-05-12 ENCOUNTER — Encounter (HOSPITAL_COMMUNITY)
Admission: RE | Admit: 2018-05-12 | Discharge: 2018-05-12 | Disposition: A | Discharge status: Home / Self Care | Payer: Self-pay | Source: Ambulatory Visit | Attending: Cardiology | Admitting: Cardiology

## 2018-05-12 ENCOUNTER — Ambulatory Visit: Payer: Medicare Other | Admitting: Pharmacist

## 2018-05-12 DIAGNOSIS — Z8673 Personal history of transient ischemic attack (TIA), and cerebral infarction without residual deficits: Secondary | ICD-10-CM | POA: Diagnosis not present

## 2018-05-12 DIAGNOSIS — Z7901 Long term (current) use of anticoagulants: Secondary | ICD-10-CM

## 2018-05-12 DIAGNOSIS — I480 Paroxysmal atrial fibrillation: Secondary | ICD-10-CM | POA: Diagnosis not present

## 2018-05-12 DIAGNOSIS — Z5181 Encounter for therapeutic drug level monitoring: Secondary | ICD-10-CM | POA: Diagnosis not present

## 2018-05-12 LAB — POCT INR: INR: 2.1 (ref 2.0–3.0)

## 2018-05-12 NOTE — Patient Instructions (Signed)
Description   Continue same dose of coumadin 1 tablet daily except 2 tablets on Sundays.  Recheck INR in 3 weeks. Call us with medication changes or concerns, Main # [phone]. Coumadin Clinic # [phone].

## 2018-05-14 ENCOUNTER — Encounter (HOSPITAL_COMMUNITY)
Admission: RE | Admit: 2018-05-14 | Discharge: 2018-05-14 | Disposition: A | Discharge status: Home / Self Care | Payer: Self-pay | Source: Ambulatory Visit | Attending: Cardiology | Admitting: Cardiology

## 2018-05-16 ENCOUNTER — Encounter (HOSPITAL_COMMUNITY)
Admission: RE | Admit: 2018-05-16 | Discharge: 2018-05-16 | Disposition: A | Discharge status: Home / Self Care | Payer: Medicare Other | Source: Ambulatory Visit | Attending: Cardiology | Admitting: Cardiology

## 2018-05-19 ENCOUNTER — Ambulatory Visit (INDEPENDENT_AMBULATORY_CARE_PROVIDER_SITE_OTHER): Payer: Medicare Other | Admitting: *Deleted

## 2018-05-19 ENCOUNTER — Encounter (HOSPITAL_COMMUNITY)
Admission: RE | Admit: 2018-05-19 | Discharge: 2018-05-19 | Disposition: A | Discharge status: Home / Self Care | Payer: Self-pay | Source: Ambulatory Visit | Attending: Cardiology | Admitting: Cardiology

## 2018-05-19 DIAGNOSIS — R001 Bradycardia, unspecified: Secondary | ICD-10-CM

## 2018-05-19 DIAGNOSIS — I441 Atrioventricular block, second degree: Secondary | ICD-10-CM | POA: Diagnosis not present

## 2018-05-19 NOTE — Progress Notes (Signed)
Remote pacemaker transmission.   

## 2018-05-21 ENCOUNTER — Encounter (HOSPITAL_COMMUNITY)
Admission: RE | Admit: 2018-05-21 | Discharge: 2018-05-21 | Disposition: A | Discharge status: Home / Self Care | Payer: Medicare Other | Source: Ambulatory Visit | Attending: Cardiology | Admitting: Cardiology

## 2018-05-23 ENCOUNTER — Encounter (HOSPITAL_COMMUNITY): Payer: Self-pay

## 2018-05-26 ENCOUNTER — Encounter (HOSPITAL_COMMUNITY): Payer: Self-pay

## 2018-05-26 ENCOUNTER — Telehealth: Payer: Self-pay | Admitting: *Deleted

## 2018-05-26 ENCOUNTER — Telehealth (HOSPITAL_COMMUNITY): Payer: Self-pay

## 2018-05-26 NOTE — Telephone Encounter (Signed)
Pt called and stated she is having a tooth extraction this week and needs to know what to do with her Coumadin. I asked the pt if they knew she was on Coumadin and she stated they didn't so advised pt to call the dentist and make them aware that she is taking Coumadin and ask them if they needed her to hold the Coumadin or not. Advised pt in most cases that if one tooth is being pulled she may be able to stay on the Coumadin but that is up to the dentist and she will need to follow their recommendations and call us back to update [place] regarding this. Advised that if they need her to hold her Coumadin that we will need to get approval from [name].

## 2018-05-26 NOTE — Telephone Encounter (Signed)
Pt called and stated she will not be here today and 11/20 because she is having dental problems.

## 2018-05-27 ENCOUNTER — Ambulatory Visit (INDEPENDENT_AMBULATORY_CARE_PROVIDER_SITE_OTHER): Payer: Medicare Other | Admitting: Pharmacist

## 2018-05-27 DIAGNOSIS — Z7901 Long term (current) use of anticoagulants: Secondary | ICD-10-CM

## 2018-05-27 DIAGNOSIS — I480 Paroxysmal atrial fibrillation: Secondary | ICD-10-CM

## 2018-05-27 DIAGNOSIS — Z8673 Personal history of transient ischemic attack (TIA), and cerebral infarction without residual deficits: Secondary | ICD-10-CM

## 2018-05-27 DIAGNOSIS — Z5181 Encounter for therapeutic drug level monitoring: Secondary | ICD-10-CM

## 2018-05-27 LAB — POCT INR: INR: 2.4 (ref 2.0–3.0)

## 2018-05-27 NOTE — Patient Instructions (Signed)
Description   Continue same dose of Coumadin 1 tablet daily except 2 tablets on Sundays.  Recheck INR in 6 weeks. Call us with medication changes or concerns, Main # [phone]. Coumadin Clinic # [phone].      

## 2018-05-28 ENCOUNTER — Encounter (HOSPITAL_COMMUNITY): Payer: Self-pay

## 2018-05-30 ENCOUNTER — Encounter (HOSPITAL_COMMUNITY)
Admission: RE | Admit: 2018-05-30 | Discharge: 2018-05-30 | Disposition: A | Discharge status: Home / Self Care | Payer: Self-pay | Source: Ambulatory Visit | Attending: Cardiology | Admitting: Cardiology

## 2018-06-02 ENCOUNTER — Encounter (HOSPITAL_COMMUNITY)
Admission: RE | Admit: 2018-06-02 | Discharge: 2018-06-02 | Disposition: A | Discharge status: Home / Self Care | Payer: Self-pay | Source: Ambulatory Visit | Attending: Cardiology | Admitting: Cardiology

## 2018-06-04 ENCOUNTER — Encounter (HOSPITAL_COMMUNITY)
Admission: RE | Admit: 2018-06-04 | Discharge: 2018-06-04 | Disposition: A | Discharge status: Home / Self Care | Payer: Medicare Other | Source: Ambulatory Visit | Attending: Cardiology | Admitting: Cardiology

## 2018-06-09 ENCOUNTER — Encounter (HOSPITAL_COMMUNITY)
Admission: RE | Admit: 2018-06-09 | Discharge: 2018-06-09 | Disposition: A | Discharge status: Home / Self Care | Payer: Self-pay | Source: Ambulatory Visit | Attending: Cardiology | Admitting: Cardiology

## 2018-06-09 DIAGNOSIS — Z952 Presence of prosthetic heart valve: Secondary | ICD-10-CM | POA: Insufficient documentation

## 2018-06-11 ENCOUNTER — Encounter (HOSPITAL_COMMUNITY)
Admission: RE | Admit: 2018-06-11 | Discharge: 2018-06-11 | Disposition: A | Discharge status: Home / Self Care | Payer: Self-pay | Source: Ambulatory Visit | Attending: Cardiology | Admitting: Cardiology

## 2018-06-13 ENCOUNTER — Encounter (HOSPITAL_COMMUNITY)
Admission: RE | Admit: 2018-06-13 | Discharge: 2018-06-13 | Disposition: A | Discharge status: Home / Self Care | Payer: Self-pay | Source: Ambulatory Visit | Attending: Cardiology | Admitting: Cardiology

## 2018-06-16 ENCOUNTER — Encounter (HOSPITAL_COMMUNITY): Payer: Self-pay

## 2018-06-18 ENCOUNTER — Encounter (HOSPITAL_COMMUNITY): Payer: Self-pay

## 2018-06-20 ENCOUNTER — Encounter (HOSPITAL_COMMUNITY): Payer: Self-pay

## 2018-06-23 ENCOUNTER — Encounter (HOSPITAL_COMMUNITY)
Admission: RE | Admit: 2018-06-23 | Discharge: 2018-06-23 | Disposition: A | Discharge status: Home / Self Care | Payer: Self-pay | Source: Ambulatory Visit | Attending: Cardiology | Admitting: Cardiology

## 2018-06-25 ENCOUNTER — Encounter (HOSPITAL_COMMUNITY)
Admission: RE | Admit: 2018-06-25 | Discharge: 2018-06-25 | Disposition: A | Discharge status: Home / Self Care | Payer: Self-pay | Source: Ambulatory Visit | Attending: Cardiology | Admitting: Cardiology

## 2018-06-27 ENCOUNTER — Encounter (HOSPITAL_COMMUNITY)
Admission: RE | Admit: 2018-06-27 | Discharge: 2018-06-27 | Disposition: A | Discharge status: Home / Self Care | Payer: Self-pay | Source: Ambulatory Visit | Attending: Cardiology | Admitting: Cardiology

## 2018-06-30 ENCOUNTER — Encounter (HOSPITAL_COMMUNITY)
Admission: RE | Admit: 2018-06-30 | Discharge: 2018-06-30 | Disposition: A | Discharge status: Home / Self Care | Payer: Self-pay | Source: Ambulatory Visit | Attending: Cardiology | Admitting: Cardiology

## 2018-07-04 ENCOUNTER — Encounter (HOSPITAL_COMMUNITY)
Admission: RE | Admit: 2018-07-04 | Discharge: 2018-07-04 | Disposition: A | Discharge status: Home / Self Care | Payer: Medicare Other | Source: Ambulatory Visit | Attending: Cardiology | Admitting: Cardiology

## 2018-07-07 ENCOUNTER — Ambulatory Visit: Payer: Medicare Other | Admitting: *Deleted

## 2018-07-07 ENCOUNTER — Encounter (HOSPITAL_COMMUNITY)
Admission: RE | Admit: 2018-07-07 | Discharge: 2018-07-07 | Disposition: A | Discharge status: Home / Self Care | Payer: Self-pay | Source: Ambulatory Visit | Attending: Cardiology | Admitting: Cardiology

## 2018-07-07 DIAGNOSIS — Z8673 Personal history of transient ischemic attack (TIA), and cerebral infarction without residual deficits: Secondary | ICD-10-CM

## 2018-07-07 DIAGNOSIS — Z5181 Encounter for therapeutic drug level monitoring: Secondary | ICD-10-CM

## 2018-07-07 DIAGNOSIS — Z7901 Long term (current) use of anticoagulants: Secondary | ICD-10-CM

## 2018-07-07 DIAGNOSIS — I480 Paroxysmal atrial fibrillation: Secondary | ICD-10-CM

## 2018-07-07 LAB — POCT INR: INR: 2.2 (ref 2.0–3.0)

## 2018-07-07 NOTE — Patient Instructions (Signed)
Description   Continue same dose of Coumadin 1 tablet daily except 2 tablets on Sundays.  Recheck INR in 6 weeks. Call us with medication changes or concerns, Main # [phone]. Coumadin Clinic # [phone].      

## 2018-07-11 ENCOUNTER — Encounter (HOSPITAL_COMMUNITY)
Admission: RE | Admit: 2018-07-11 | Discharge: 2018-07-11 | Disposition: A | Discharge status: Home / Self Care | Payer: Self-pay | Source: Ambulatory Visit | Attending: Cardiology | Admitting: Cardiology

## 2018-07-11 DIAGNOSIS — Z952 Presence of prosthetic heart valve: Secondary | ICD-10-CM | POA: Insufficient documentation

## 2018-07-14 ENCOUNTER — Encounter (HOSPITAL_COMMUNITY)
Admission: RE | Admit: 2018-07-14 | Discharge: 2018-07-14 | Disposition: A | Discharge status: Home / Self Care | Payer: Self-pay | Source: Ambulatory Visit | Attending: Cardiology | Admitting: Cardiology

## 2018-07-16 ENCOUNTER — Encounter (HOSPITAL_COMMUNITY)
Admission: RE | Admit: 2018-07-16 | Discharge: 2018-07-16 | Disposition: A | Discharge status: Home / Self Care | Payer: Self-pay | Source: Ambulatory Visit | Attending: Cardiology | Admitting: Cardiology

## 2018-07-18 ENCOUNTER — Encounter (HOSPITAL_COMMUNITY): Payer: Self-pay

## 2018-07-20 LAB — CUP PACEART REMOTE DEVICE CHECK
Battery Voltage: 2.99 V
Brady Statistic AP VP Percent: 32 %
Brady Statistic AP VS Percent: 1 %
Brady Statistic AS VP Percent: 62 %
Brady Statistic AS VS Percent: 1.3 %
Brady Statistic RA Percent Paced: 27 %
Date Time Interrogation Session: 20191111070015
Implantable Lead Implant Date: 20160701
Implantable Lead Location: 753859
Implantable Lead Location: 753860
Lead Channel Impedance Value: 400 Ohm
Lead Channel Impedance Value: 550 Ohm
Lead Channel Pacing Threshold Amplitude: 0.75 V
Lead Channel Pacing Threshold Pulse Width: 0.4 ms
Lead Channel Sensing Intrinsic Amplitude: 12 mV
Lead Channel Setting Pacing Amplitude: 2 V
Lead Channel Setting Sensing Sensitivity: 2 mV
MDC IDC LEAD IMPLANT DT: 20160701
MDC IDC MSMT BATTERY REMAINING LONGEVITY: 117 mo
MDC IDC MSMT BATTERY REMAINING PERCENTAGE: 95.5 %
MDC IDC MSMT LEADCHNL RA SENSING INTR AMPL: 4.1 mV
MDC IDC MSMT LEADCHNL RV PACING THRESHOLD AMPLITUDE: 1.125 V
MDC IDC MSMT LEADCHNL RV PACING THRESHOLD PULSEWIDTH: 0.4 ms
MDC IDC PG IMPLANT DT: 20160701
MDC IDC SET LEADCHNL RV PACING AMPLITUDE: 1.375
MDC IDC SET LEADCHNL RV PACING PULSEWIDTH: 0.4 ms
MDC IDC STAT BRADY RV PERCENT PACED: 94 %
Pulse Gen Model: 2240
Pulse Gen Serial Number: 7785935

## 2018-07-21 ENCOUNTER — Encounter (HOSPITAL_COMMUNITY)
Admission: RE | Admit: 2018-07-21 | Discharge: 2018-07-21 | Disposition: A | Discharge status: Home / Self Care | Payer: Medicare Other | Source: Ambulatory Visit | Attending: Cardiology | Admitting: Cardiology

## 2018-07-23 ENCOUNTER — Encounter (HOSPITAL_COMMUNITY): Payer: Self-pay

## 2018-07-25 ENCOUNTER — Encounter (HOSPITAL_COMMUNITY): Payer: Self-pay

## 2018-07-28 ENCOUNTER — Encounter (HOSPITAL_COMMUNITY): Payer: Self-pay

## 2018-07-30 ENCOUNTER — Encounter (HOSPITAL_COMMUNITY): Payer: Self-pay

## 2018-08-01 ENCOUNTER — Encounter (HOSPITAL_COMMUNITY): Payer: Self-pay

## 2018-08-04 ENCOUNTER — Other Ambulatory Visit: Payer: Self-pay | Admitting: Cardiology

## 2018-08-04 ENCOUNTER — Encounter (HOSPITAL_COMMUNITY)
Admission: RE | Admit: 2018-08-04 | Discharge: 2018-08-04 | Disposition: A | Discharge status: Home / Self Care | Payer: Self-pay | Source: Ambulatory Visit | Attending: Cardiology | Admitting: Cardiology

## 2018-08-06 ENCOUNTER — Encounter (HOSPITAL_COMMUNITY)
Admission: RE | Admit: 2018-08-06 | Discharge: 2018-08-06 | Disposition: A | Discharge status: Home / Self Care | Payer: Self-pay | Source: Ambulatory Visit | Attending: Cardiology | Admitting: Cardiology

## 2018-08-08 ENCOUNTER — Encounter (HOSPITAL_COMMUNITY)
Admission: RE | Admit: 2018-08-08 | Discharge: 2018-08-08 | Disposition: A | Discharge status: Home / Self Care | Payer: Self-pay | Source: Ambulatory Visit | Attending: Cardiology | Admitting: Cardiology

## 2018-08-11 ENCOUNTER — Encounter (HOSPITAL_COMMUNITY): Payer: Self-pay

## 2018-08-11 DIAGNOSIS — Z952 Presence of prosthetic heart valve: Secondary | ICD-10-CM | POA: Insufficient documentation

## 2018-08-13 ENCOUNTER — Encounter (HOSPITAL_COMMUNITY)
Admission: RE | Admit: 2018-08-13 | Discharge: 2018-08-13 | Disposition: A | Discharge status: Home / Self Care | Payer: Self-pay | Source: Ambulatory Visit | Attending: Cardiology | Admitting: Cardiology

## 2018-08-15 ENCOUNTER — Encounter (HOSPITAL_COMMUNITY): Payer: Self-pay

## 2018-08-18 ENCOUNTER — Ambulatory Visit: Payer: Medicare Other | Admitting: *Deleted

## 2018-08-18 ENCOUNTER — Encounter (HOSPITAL_COMMUNITY)
Admission: RE | Admit: 2018-08-18 | Discharge: 2018-08-18 | Disposition: A | Discharge status: Home / Self Care | Payer: Medicare Other | Source: Ambulatory Visit | Attending: Cardiology | Admitting: Cardiology

## 2018-08-18 ENCOUNTER — Ambulatory Visit (INDEPENDENT_AMBULATORY_CARE_PROVIDER_SITE_OTHER): Payer: Medicare Other

## 2018-08-18 DIAGNOSIS — R001 Bradycardia, unspecified: Secondary | ICD-10-CM

## 2018-08-18 DIAGNOSIS — I480 Paroxysmal atrial fibrillation: Secondary | ICD-10-CM

## 2018-08-18 DIAGNOSIS — Z8673 Personal history of transient ischemic attack (TIA), and cerebral infarction without residual deficits: Secondary | ICD-10-CM

## 2018-08-18 DIAGNOSIS — I4430 Unspecified atrioventricular block: Secondary | ICD-10-CM

## 2018-08-18 DIAGNOSIS — Z5181 Encounter for therapeutic drug level monitoring: Secondary | ICD-10-CM

## 2018-08-18 DIAGNOSIS — Z7901 Long term (current) use of anticoagulants: Secondary | ICD-10-CM

## 2018-08-18 LAB — CUP PACEART REMOTE DEVICE CHECK
Battery Remaining Longevity: 117 mo
Battery Remaining Percentage: 95.5 %
Battery Voltage: 2.99 V
Brady Statistic AP VP Percent: 32 %
Brady Statistic AS VP Percent: 64 %
Brady Statistic RA Percent Paced: 28 %
Brady Statistic RV Percent Paced: 96 %
Date Time Interrogation Session: 20200210070617
Implantable Lead Implant Date: 20160701
Implantable Lead Implant Date: 20160701
Implantable Lead Location: 753859
Implantable Pulse Generator Implant Date: 20160701
Lead Channel Impedance Value: 400 Ohm
Lead Channel Impedance Value: 560 Ohm
Lead Channel Pacing Threshold Amplitude: 0.75 V
Lead Channel Pacing Threshold Amplitude: 1 V
Lead Channel Pacing Threshold Pulse Width: 0.4 ms
Lead Channel Pacing Threshold Pulse Width: 0.4 ms
Lead Channel Sensing Intrinsic Amplitude: 12 mV
Lead Channel Sensing Intrinsic Amplitude: 4.8 mV
Lead Channel Setting Pacing Amplitude: 1.25 V
Lead Channel Setting Pacing Amplitude: 2 V
MDC IDC LEAD LOCATION: 753860
MDC IDC SET LEADCHNL RV PACING PULSEWIDTH: 0.4 ms
MDC IDC SET LEADCHNL RV SENSING SENSITIVITY: 2 mV
MDC IDC STAT BRADY AP VS PERCENT: 1 %
MDC IDC STAT BRADY AS VS PERCENT: 1 %
Pulse Gen Serial Number: 7785935

## 2018-08-18 LAB — POCT INR: INR: 2.3 (ref 2.0–3.0)

## 2018-08-18 NOTE — Patient Instructions (Signed)
Description   Continue same dose of Coumadin 1 tablet daily except 2 tablets on Sundays.  Recheck INR in 6 weeks. Call us with medication changes or concerns, Main # [phone]. Coumadin Clinic # [phone].

## 2018-08-20 ENCOUNTER — Encounter (HOSPITAL_COMMUNITY): Payer: Self-pay

## 2018-08-22 ENCOUNTER — Encounter (HOSPITAL_COMMUNITY)
Admission: RE | Admit: 2018-08-22 | Discharge: 2018-08-22 | Disposition: A | Discharge status: Home / Self Care | Payer: Self-pay | Source: Ambulatory Visit | Attending: Cardiology | Admitting: Cardiology

## 2018-08-25 ENCOUNTER — Encounter (HOSPITAL_COMMUNITY)
Admission: RE | Admit: 2018-08-25 | Discharge: 2018-08-25 | Disposition: A | Discharge status: Home / Self Care | Payer: Self-pay | Source: Ambulatory Visit | Attending: Cardiology | Admitting: Cardiology

## 2018-08-27 ENCOUNTER — Encounter (HOSPITAL_COMMUNITY): Payer: Self-pay

## 2018-08-28 NOTE — Progress Notes (Signed)
Remote pacemaker transmission.   

## 2018-08-29 ENCOUNTER — Encounter (HOSPITAL_COMMUNITY): Payer: Self-pay

## 2018-09-01 ENCOUNTER — Encounter (HOSPITAL_COMMUNITY): Admission: RE | Admit: 2018-09-01 | Payer: Self-pay | Source: Ambulatory Visit

## 2018-09-03 ENCOUNTER — Encounter (HOSPITAL_COMMUNITY): Payer: Self-pay

## 2018-09-05 ENCOUNTER — Encounter (HOSPITAL_COMMUNITY): Payer: Self-pay

## 2018-09-26 ENCOUNTER — Telehealth: Payer: Self-pay

## 2018-09-26 NOTE — Telephone Encounter (Signed)

## 2018-09-29 ENCOUNTER — Ambulatory Visit (INDEPENDENT_AMBULATORY_CARE_PROVIDER_SITE_OTHER): Payer: Medicare Other | Admitting: *Deleted

## 2018-09-29 ENCOUNTER — Other Ambulatory Visit: Payer: Self-pay

## 2018-09-29 DIAGNOSIS — I480 Paroxysmal atrial fibrillation: Secondary | ICD-10-CM | POA: Diagnosis not present

## 2018-09-29 DIAGNOSIS — Z7901 Long term (current) use of anticoagulants: Secondary | ICD-10-CM

## 2018-09-29 DIAGNOSIS — Z5181 Encounter for therapeutic drug level monitoring: Secondary | ICD-10-CM | POA: Diagnosis not present

## 2018-09-29 DIAGNOSIS — Z8673 Personal history of transient ischemic attack (TIA), and cerebral infarction without residual deficits: Secondary | ICD-10-CM

## 2018-09-29 LAB — POCT INR: INR: 1.9 — AB (ref 2.0–3.0)

## 2018-10-10 ENCOUNTER — Telehealth: Payer: Self-pay

## 2018-10-10 NOTE — Telephone Encounter (Signed)
TELEPHONE CALL NOTE  This patient has been deemed a candidate for follow-up tele-health visit to limit community exposure during the Covid-19 pandemic. I spoke with the patient via phone to discuss instructions. This has been outlined on the patient's AVS (dotphrase: [email or web address]). The patient was advised to review the section on consent for treatment as well. The patient will receive a phone call 2-3 days prior to their E-Visit at which time consent will be verbally confirmed. A Virtual Office Visit appointment type has been scheduled for 8:30 am on 10/13/18 with [name], with "VIDEO" or "TELEPHONE" in the appointment notes - patient prefers type.  Dustin Flock, RN 10/10/2018 5:06 PM  TELEPHONE CALL NOTE  Amanda [name] has been deemed a candidate for a follow-up tele-health visit to limit community exposure during the Covid-19 pandemic. I spoke with the patient via phone to ensure availability of phone/video source, confirm preferred email & phone number, and discuss instructions and expectations.  I reminded Amanda [name] to be prepared with any vital sign and/or heart rhythm information that could potentially be obtained via home monitoring, at the time of her visit. I reminded Amanda [name] to expect a phone call at the time of her visit if her visit.  Did the patient verbally acknowledge consent to treatment? YES  Dustin Flock, RN 10/10/2018 5:07 PM  CONSENT FOR TELE-HEALTH VISIT - PLEASE REVIEW  I hereby voluntarily request, consent and authorize [facility] and its employed or contracted physicians, physician assistants, nurse practitioners or other licensed health care professionals (the Practitioner), to provide me with telemedicine health care services (the "Services") as deemed necessary by the treating Practitioner. I acknowledge and consent to receive the Services by the Practitioner via telemedicine. I understand that the telemedicine visit will involve  communicating with the Practitioner through live audiovisual communication technology and the disclosure of certain medical information by electronic transmission. I acknowledge that I have been given the opportunity to request an in-person assessment or other available alternative prior to the telemedicine visit and am voluntarily participating in the telemedicine visit.  I understand that I have the right to withhold or withdraw my consent to the use of telemedicine in the course of my care at any time, without affecting my right to future care or treatment, and that the Practitioner or I may terminate the telemedicine visit at any time. I understand that I have the right to inspect all information obtained and/or recorded in the course of the telemedicine visit and may receive copies of available information for a reasonable fee.  I understand that some of the potential risks of receiving the Services via telemedicine include:  [name] Delay or interruption in medical evaluation due to technological equipment failure or disruption; . Information transmitted may not be sufficient (e.g. poor resolution of images) to allow for appropriate medical decision making by the Practitioner; and/or  . In rare instances, security protocols could fail, causing a breach of personal health information.  Furthermore, I acknowledge that it is my responsibility to provide information about my medical history, conditions and care that is complete and accurate to the best of my ability. I acknowledge that Practitioner's advice, recommendations, and/or decision may be based on factors not within their control, such as incomplete or inaccurate data provided by me or distortions of diagnostic images or specimens that may result from electronic transmissions. I understand that the practice of medicine is not an exact science and that Practitioner makes no warranties or  guarantees regarding treatment outcomes. I acknowledge that I will  receive a copy of this consent concurrently upon execution via email to the email address I last provided but may also request a printed copy by calling the office of [facility].    I understand that my insurance will be billed for this visit.   I have read or had this consent read to me. . I understand the contents of this consent, which adequately explains the benefits and risks of the Services being provided via telemedicine.  . I have been provided ample opportunity to ask questions regarding this consent and the Services and have had my questions answered to my satisfaction. . I give my informed consent for the services to be provided through the use of telemedicine in my medical care  By participating in this telemedicine visit I agree to the above.

## 2018-10-12 NOTE — Progress Notes (Signed)
Virtual Visit via Video Note   This visit type was conducted due to national recommendations for restrictions regarding the COVID-19 Pandemic (e.g. social distancing) in an effort to limit this patient's exposure and mitigate transmission in our community.  Due to her co-morbid illnesses, this patient is at least at moderate risk for complications without adequate follow up.  This format is felt to be most appropriate for this patient at this time.  All issues noted in this document were discussed and addressed.  A limited physical exam was performed with this format.  Please refer to the patient's chart for her consent to telehealth for [facility].  Evaluation Performed:  Follow-up visit  This visit type was conducted due to national recommendations for restrictions regarding the COVID-19 Pandemic (e.g. social distancing).  This format is felt to be most appropriate for this patient at this time.  All issues noted in this document were discussed and addressed.  No physical exam was performed (except for noted visual exam findings with Video Visits).  Please refer to the patient's chart (MyChart message for video visits and phone note for telephone visits) for the patient's consent to telehealth for [facility].  Date:  10/13/2018   ID:  Amanda [name], DOB [birth year], MRN [id number]  Patient Location:  Home2  Provider location:   [place]  PCP:  [name], MD  Cardiologist:  Armanda Magic, MD  Electrophysiologist:  None   Chief Complaint:  Mitral regurgitation and atrial fibrillation  History of Present Illness:    Amanda [name] is a 78 y.o. female who presents via audio/video conferencing for a telehealth visit today.   Amanda [name] is a 78 y.o. female with a hx severe MR  s/p minimally invasive mitral valve repair and maze procedure 04/16/2017. She had a complicated course requiring inpatient rehab and right thoracentesis. She then underwent right Pleurx  catheter for recurrence. Also has history of PACs, PVCs, PAF, hypertension.  CHA2DS2-VASc 4on NOAC.  She is here today for followup and is doing well.  She denies any chest pain or pressure, SOB, DOE, PND, orthopnea, LE edema,  palpitations or syncope. Her husband died in 01-Aug-2022 and she thinks that she may be depressed some.  Her appetite has decreased and food doesn't taste as good.  Her only complaint is that she gets swimmy headed in the am and weak after taking her Lopressor.  She had weaned it down to 25mg  that she takes mid morning and dropped the evening dose.  Dr. [name] still had her on a diuretic that he wanted her to stay on.  She does drink caffeine throughout the day and does not think that she is keeping hydrated.  She is compliant with her meds and is tolerating meds with no SE.    The patient does not have symptoms concerning for COVID-19 infection (fever, chills, cough, or new shortness of breath).    Prior CV studies:   The following studies were reviewed today:  2D echo 02/2018  Past Medical History:  Diagnosis Date   Allergic rhinitis    Anemia    Asymptomatic LV dysfunction    EF 45-50% echo August 01, 2017   CKD (chronic kidney disease), stage III (HCC)    stage III   DJD (degenerative joint disease)    Glomerulonephritis    Dr [name]   Hyperlipidemia    Hyperparathyroidism (HCC)    Hypertension    Hypothyroidism    IBS (irritable bowel syndrome)  Interstitial cystitis    Mitral regurgitation    Mobitz II    a. s/p STJ dual chamber PPM    MVP (mitral valve prolapse)    moderate posterior MVP with moderate MR and grade II diasotlic dysfunction   PAC (premature atrial contraction)    PAF (paroxysmal atrial fibrillation) (HCC)     note on pacer check. CHADS2VASC score is 4 now on Eliquis.   PVC's (premature ventricular contractions)    S/P minimally invasive maze operation for atrial fibrillation 04/16/2017   Complete bilateral atrial  lesion set using cryothermy and bipolar radiofrequency ablation with clipping of LA appendage via right mini thoracotomy approach   S/P minimally invasive mitral valve repair 04/16/2017   Complex valvuloplasty including triangular resection of posterior leaflet, artificial Gore-tex neochords x6 and Sorin Memo 3D ring annuloplasty ([id number], size 32, serial # [id number])   S/P placement of cardiac pacemaker    Small vessel disease, cerebrovascular    Stroke [facility])    a. old stroke seen on imaging.   Past Surgical History:  Procedure Laterality Date   ABDOMINAL HYSTERECTOMY     BREAST BIOPSY Right    CHEST TUBE INSERTION Right 05/23/2017   Procedure: INSERTION PLEURAL DRAINAGE CATHETER;  Surgeon: Purcell Nails, MD;  Location: [facility] OR;  Service: Thoracic;  Laterality: Right;  possible pleurex catheter   CLIPPING OF ATRIAL APPENDAGE  04/16/2017   Procedure: CLIPPING OF ATRIAL APPENDAGE using AtriCure Pro2 clip 45;  Surgeon: Purcell Nails, MD;  Location: [facility] OR;  Service: Open Heart Surgery;;   CYSTOSTOMY W/ BLADDER BIOPSY     EP IMPLANTABLE DEVICE N/A 01/07/2015   STJ dual chamber pacemaker implanted by Dr [name] for 2:1 heart block   MINIMALLY INVASIVE MAZE PROCEDURE N/A 04/16/2017   Procedure: MINIMALLY INVASIVE MAZE PROCEDURE;  Surgeon: Purcell Nails, MD;  Location: [facility] OR;  Service: Open Heart Surgery;  Laterality: N/A;   MITRAL VALVE REPAIR Right 04/16/2017   Procedure: MINIMALLY INVASIVE MITRAL VALVE REPAIR (MVR);  Surgeon: Purcell Nails, MD;  Location: [facility] OR;  Service: Open Heart Surgery;  Laterality: Right;   MITRAL VALVE REPAIR Right 04/16/2017   Procedure: RE-EXPLORATION RIGHT THORACOTOMY FOR BLEEDING;  Surgeon: Purcell Nails, MD;  Location: [facility] OR;  Service: Open Heart Surgery;  Laterality: Right;   PERIPHERAL VASCULAR CATHETERIZATION N/A 02/03/2015   Procedure: Upper Extremity Venography;  Surgeon: [name], MD;  Location: [facility] INVASIVE CV LAB;  Service: Cardiovascular;   Laterality: N/A;   REMOVAL OF PLEURAL DRAINAGE CATHETER Right 09/03/2017   Procedure: REMOVAL OF PLEURAL DRAINAGE CATHETER;  Surgeon: Purcell Nails, MD;  Location: [facility] OR;  Service: Thoracic;  Laterality: Right;   RIGHT/LEFT HEART CATH AND CORONARY ANGIOGRAPHY N/A 03/26/2017   Procedure: RIGHT/LEFT HEART CATH AND CORONARY ANGIOGRAPHY;  Surgeon: [name], MD;  Location: [facility] INVASIVE CV LAB;  Service: Cardiovascular;  Laterality: N/A;   TEE WITHOUT CARDIOVERSION N/A 03/18/2017   Procedure: TRANSESOPHAGEAL ECHOCARDIOGRAM (TEE);  Surgeon: [name], MD;  Location: [facility] ENDOSCOPY;  Service: Cardiovascular;  Laterality: N/A;   TEE WITHOUT CARDIOVERSION N/A 04/16/2017   Procedure: TRANSESOPHAGEAL ECHOCARDIOGRAM (TEE);  Surgeon: Purcell Nails, MD;  Location: [facility] OR;  Service: Open Heart Surgery;  Laterality: N/A;   TONSILLECTOMY       No outpatient medications have been marked as taking for the 10/13/18 encounter (Appointment) with Quintella Reichert, MD.     Allergies:   Pepcid [famotidine]; Allopurinol; Amlodipine; Atorvastatin; Contrast media [iodinated diagnostic agents];  Cephalexin; Ciprofloxacin; Colchicine; Erythromycin; Prilosec [omeprazole]; Sulfonamide derivatives; and Tetracycline   Social History   Tobacco Use   Smoking status: Former Smoker   Smokeless tobacco: Never Used  Substance Use Topics   Alcohol use: No   Drug use: No     Family Hx: The patient's family history includes Brain cancer in her brother; Heart attack in her brother and father; Heart disease in her brother, father, and mother; Hypertension in her brother, mother, and sister; Melanoma in her brother; Ovarian cancer in her sister; Rheum arthritis in her sister; Stroke in her mother. There is no history of Colon cancer.  ROS:   Please see the history of present illness.     All other systems reviewed and are negative.   Labs/Other Tests and Data Reviewed:    Recent Labs: No results found  for requested labs within last 8760 hours.   Recent Lipid Panel Lab Results  Component Value Date/Time   CHOL 153 01/07/2015 03:04 AM   TRIG 197 (H) 04/22/2017 04:19 AM   HDL 51 01/07/2015 03:04 AM   CHOLHDL 3.0 01/07/2015 03:04 AM   LDLCALC 89 01/07/2015 03:04 AM    Wt Readings from Last 3 Encounters:  04/21/18 136 lb (61.7 kg)  04/16/18 136 lb 6.4 oz (61.9 kg)  03/11/18 133 lb 1.9 oz (60.4 kg)     Objective:    Vital Signs:  There were no vitals taken for this visit.   Well nourished, well developed female in no acute distress. Well appearing, alert and conversant, regular work of breathing,  good skin color  Eyes- anicteric mouth- oral mucosa is pink  neuro- grossly intact skin- no apparent rash or lesions or cyanosis   ASSESSMENT & PLAN:    1.  Paroxysmal Atrial Fibrillation - she is s/p MAZE procedure at the time of her MV repair.  She has not had re occurrence of her afib.  She will continue on BB and warfarin.    2.  HTN - she checks her BP at home and it has been well controlled but she has been getting swimmy headed in the am and weak after she takes her metoprolol tartrate 25mg .  She dropped the evening BB dose because it made her feel bad.  She will continue on amiloride-HCT 2.5/25mg  daily and I have instructed her to decrease her Lopressor to 12.5mg  qam.  Her creatinine was mildly elevated at 1.48 on labs 08/2018 by Dr. [name].  I will check back with her on telemedicine in 2 weeks to see how she is doing.   3.  Severe mitral regurgitation - s/p Mitral valve repair.  2D echo 02/2018 showed trivial MR.    4.  PVCs - these are well controlled on BB.   5.  High grade AV block - s/p PPM.  She is followed in device clinic. Her last device transmission showed 25 device mode switches all < 1 minute since 02/2018.  COVID-19 Education: The signs and symptoms of COVID-19 were discussed with the patient and how to seek care for testing (follow up with PCP or arrange  E-visit).  The importance of social distancing was discussed today.  Patient Risk:   After full review of this patient's clinical status, I feel that they are at least moderate risk at this time.  Time:   Today, I have spent 25 minutes with the patient reviewing chart and discussing medical problems including mitral rergitation, reviewing echo from 02/2018 with her and her daughter, discussing  ways to stay hydrated and get exercise, plan going forward with her meds and reviewing symptoms of COVID 19 and the ways to protect against contracting the virus with telehealth technology.      Medication Adjustments/Labs and Tests Ordered: Current medicines are reviewed at length with the patient today.  Concerns regarding medicines are outlined above.  Tests Ordered: No orders of the defined types were placed in this encounter.  Medication Changes: No orders of the defined types were placed in this encounter.   Disposition:  Follow up in 2 week(s)  Signed, Armanda Magic, MD  10/13/2018 7:56 AM    [facility]

## 2018-10-13 ENCOUNTER — Other Ambulatory Visit: Payer: Self-pay

## 2018-10-13 ENCOUNTER — Telehealth: Payer: Medicare Other | Admitting: Cardiology

## 2018-10-13 ENCOUNTER — Telehealth (INDEPENDENT_AMBULATORY_CARE_PROVIDER_SITE_OTHER): Payer: Medicare Other | Admitting: Cardiology

## 2018-10-13 DIAGNOSIS — I10 Essential (primary) hypertension: Secondary | ICD-10-CM

## 2018-10-13 DIAGNOSIS — I340 Nonrheumatic mitral (valve) insufficiency: Secondary | ICD-10-CM

## 2018-10-13 DIAGNOSIS — I4430 Unspecified atrioventricular block: Secondary | ICD-10-CM | POA: Diagnosis not present

## 2018-10-13 DIAGNOSIS — Z950 Presence of cardiac pacemaker: Secondary | ICD-10-CM | POA: Diagnosis not present

## 2018-10-13 DIAGNOSIS — I480 Paroxysmal atrial fibrillation: Secondary | ICD-10-CM

## 2018-10-13 MED ORDER — METOPROLOL TARTRATE 25 MG PO TABS: 12.5000 mg | ORAL_TABLET | Freq: Every morning | ORAL | Status: DC

## 2018-10-13 NOTE — Patient Instructions (Signed)
Medication Instructions:  Decrease: Lopressor 12.5 mg qam. If you need a refill on your cardiac medications before your next appointment, please call your pharmacy.   Lab work: None If you have labs (blood work) drawn today and your tests are completely normal, you will receive your results only by: [name] MyChart Message (if you have MyChart) OR . A paper copy in the mail If you have any lab test that is abnormal or we need to change your treatment, we will call you to review the results.  Testing/Procedures: None  Follow-Up:Your physician recommends that you schedule a follow-up appointment in: 2 week Virtual Visit with [name], we will call and schedule.

## 2018-10-27 ENCOUNTER — Ambulatory Visit (INDEPENDENT_AMBULATORY_CARE_PROVIDER_SITE_OTHER): Payer: Medicare Other | Admitting: Pharmacist

## 2018-10-27 ENCOUNTER — Other Ambulatory Visit: Payer: Self-pay

## 2018-10-27 DIAGNOSIS — Z5181 Encounter for therapeutic drug level monitoring: Secondary | ICD-10-CM | POA: Diagnosis not present

## 2018-10-27 DIAGNOSIS — Z8673 Personal history of transient ischemic attack (TIA), and cerebral infarction without residual deficits: Secondary | ICD-10-CM

## 2018-10-27 DIAGNOSIS — I480 Paroxysmal atrial fibrillation: Secondary | ICD-10-CM | POA: Diagnosis not present

## 2018-10-27 DIAGNOSIS — Z7901 Long term (current) use of anticoagulants: Secondary | ICD-10-CM

## 2018-10-27 LAB — POCT INR: INR: 1.3 — AB (ref 2.0–3.0)

## 2018-10-29 NOTE — Progress Notes (Signed)
Virtual Visit via Video Note   This visit type was conducted due to national recommendations for restrictions regarding the COVID-19 Pandemic (e.g. social distancing) in an effort to limit this patient's exposure and mitigate transmission in our community.  Due to her co-morbid illnesses, this patient is at least at moderate risk for complications without adequate follow up.  This format is felt to be most appropriate for this patient at this time.  All issues noted in this document were discussed and addressed.  A limited physical exam was performed with this format.  Please refer to the patient's chart for her consent to telehealth for [facility].  Evaluation Performed:  Follow-up visit  This visit type was conducted due to national recommendations for restrictions regarding the COVID-19 Pandemic (e.g. social distancing).  This format is felt to be most appropriate for this patient at this time.  All issues noted in this document were discussed and addressed.  No physical exam was performed (except for noted visual exam findings with Video Visits).  Please refer to the patient's chart (MyChart message for video visits and phone note for telephone visits) for the patient's consent to telehealth for [facility].  Date:  10/30/2018   ID:  Amanda [name], DOB 1941/07/04, MRN [id number]  Patient Location:  Home  Provider location:   [place]  PCP:  [name], MD  Cardiologist:  Armanda Magic, MD  Electrophysiologist:  None   Chief Complaint:  MR and atrial fibrillation  History of Present Illness:    Amanda [name] is a 78 y.o. female who presents via audio/video conferencing for a telehealth visit today.    Amanda A Eichhornis a 78 y.o.femalewith a hx severe MR  s/pminimally invasive mitral valve repair and maze procedure 04/16/2017. She had a complicated course requiring inpatient rehab and right thoracentesis. She then underwent right Pleurx catheter for  recurrence. Also has history of PACs, PVCs, PAF, hypertension.CHA2DS2-VASc 4on NOAC.    I saw her 2 weeks ago and she was complaining of being swimmy headed in the am and weak after taking her Lopressor.  She had weaned herself down to  only in the am.  She says that she had poor PO intake and likely was not staying hydrated like she should.  Despite dropping her her dose of BB she still was having some swimmy headedness.  She is on a diuretic that nephrology wanted her to stay on.  I decreased her Lopressor further to 12.5mg  qam and then stopped it.  She is here today for followup and is doing well.  She is feeling so much better after getting off the BB.  She has her energy back and has not had any further dizziness.  She denies any chest pain or pressure, SOB, DOE, PND, orthopnea, LE edema, dizziness, palpitations or syncope. She is compliant with her meds and is tolerating meds with no SE.    The patient does not have symptoms concerning for COVID-19 infection (fever, chills, cough, or new shortness of breath).    Prior CV studies:   The following studies were reviewed today:  NONE  Past Medical History:  Diagnosis Date   Allergic rhinitis    Anemia    Asymptomatic LV dysfunction    EF 45-50% echo 07/2017   CKD (chronic kidney disease), stage III (HCC)    stage III   DJD (degenerative joint disease)    Glomerulonephritis    Dr [name]   Hyperlipidemia    Hyperparathyroidism (HCC)  Hypertension    Hypothyroidism    IBS (irritable bowel syndrome)    Interstitial cystitis    Mitral regurgitation    Mobitz II    a. s/p STJ dual chamber PPM    MVP (mitral valve prolapse)    moderate posterior MVP with moderate MR and grade II diasotlic dysfunction   PAC (premature atrial contraction)    PAF (paroxysmal atrial fibrillation) (HCC)     note on pacer check. CHADS2VASC score is 4 now on Eliquis.   PVC's (premature ventricular contractions)    S/P  minimally invasive maze operation for atrial fibrillation 04/16/2017   Complete bilateral atrial lesion set using cryothermy and bipolar radiofrequency ablation with clipping of LA appendage via right mini thoracotomy approach   S/P minimally invasive mitral valve repair 04/16/2017   Complex valvuloplasty including triangular resection of posterior leaflet, artificial Gore-tex neochords x6 and Sorin Memo 3D ring annuloplasty ([id number], size 32, serial # [id number])   S/P placement of cardiac pacemaker    Small vessel disease, cerebrovascular    Stroke [facility](HCC)    a. old stroke seen on imaging.   Past Surgical History:  Procedure Laterality Date   ABDOMINAL HYSTERECTOMY     BREAST BIOPSY Right    CHEST TUBE INSERTION Right 05/23/2017   Procedure: INSERTION PLEURAL DRAINAGE CATHETER;  Surgeon: Purcell Nailswen, Clarence H, MD;  [facility];  Service: Thoracic;  Laterality: Right;  possible pleurex catheter   CLIPPING OF ATRIAL APPENDAGE  04/16/2017   Procedure: CLIPPING OF ATRIAL APPENDAGE using AtriCure Pro2 clip 45;  Surgeon: Purcell Nailswen, Clarence H, MD;  Location: [facility] OR;  Service: Open Heart Surgery;;   CYSTOSTOMY W/ BLADDER BIOPSY     EP IMPLANTABLE DEVICE N/A 01/07/2015   STJ dual chamber pacemaker implanted by Dr Ladona Ridgelaylor for 2:1 heart block   MINIMALLY INVASIVE MAZE PROCEDURE N/A 04/16/2017   Procedure: MINIMALLY INVASIVE MAZE PROCEDURE;  Surgeon: Purcell Nailswen, Clarence H, MD;  [facility];  Service: Open Heart Surgery;  Laterality: N/A;   MITRAL VALVE REPAIR Right 04/16/2017   Procedure: MINIMALLY INVASIVE MITRAL VALVE REPAIR (MVR);  Surgeon: Purcell Nailswen, Clarence H, MD;  [facility];  Service: Open Heart Surgery;  Laterality: Right;   MITRAL VALVE REPAIR Right 04/16/2017   Procedure: RE-EXPLORATION RIGHT THORACOTOMY FOR BLEEDING;  Surgeon: Purcell Nailswen, Clarence H, MD;  [facility];  Service: Open Heart Surgery;  Laterality: Right;   PERIPHERAL VASCULAR CATHETERIZATION N/A 02/03/2015   Procedure: Upper Extremity  Venography;  Surgeon: Duke SalviaSteven C Klein, MD;  [facility];  Service: Cardiovascular;  Laterality: N/A;   REMOVAL OF PLEURAL DRAINAGE CATHETER Right 09/03/2017   Procedure: REMOVAL OF PLEURAL DRAINAGE CATHETER;  Surgeon: Purcell Nailswen, Clarence H, MD;  [facility];  Service: Thoracic;  Laterality: Right;   RIGHT/LEFT HEART CATH AND CORONARY ANGIOGRAPHY N/A 03/26/2017   Procedure: RIGHT/LEFT HEART CATH AND CORONARY ANGIOGRAPHY;  Surgeon: Marykay LexHarding, David W, MD;  [facility];  Service: Cardiovascular;  Laterality: N/A;   TEE WITHOUT CARDIOVERSION N/A 03/18/2017   Procedure: TRANSESOPHAGEAL ECHOCARDIOGRAM (TEE);  Surgeon: Chilton Siandolph, Tiffany, MD;  [facility];  Service: Cardiovascular;  Laterality: N/A;   TEE WITHOUT CARDIOVERSION N/A 04/16/2017   Procedure: TRANSESOPHAGEAL ECHOCARDIOGRAM (TEE);  Surgeon: Purcell Nailswen, Clarence H, MD;  [facility];  Service: Open Heart Surgery;  Laterality: N/A;   TONSILLECTOMY       Current Meds  Medication Sig   acetaminophen (TYLENOL) 500 MG tablet Take 500 mg by mouth every 6 (six)  hours as needed.    AMILORIDE-HYDROCHLOROTHIAZIDE PO Take 2.5-25 mg by mouth daily. 1/2 tablet daily   calcitRIOL (ROCALTROL) 0.25 MCG capsule Take 0.25 mcg by mouth every 3 (three) days. In the morning    conjugated estrogens (PREMARIN) vaginal cream Place 1 Applicatorful vaginally at bedtime.    donepezil (ARICEPT) 5 MG tablet Take 5 mg by mouth daily.   fexofenadine (ALLEGRA) 180 MG tablet Take 180 mg by mouth daily.   fluticasone (FLONASE) 50 MCG/ACT nasal spray Place 2 sprays into both nostrils daily.    levothyroxine (SYNTHROID, LEVOTHROID) 75 MCG tablet Take 75 mcg by mouth daily.   vitamin B-12 (CYANOCOBALAMIN) 1000 MCG tablet Take 1,000 mcg by mouth once a week.   warfarin (COUMADIN) 1 MG tablet TAKE ONE TABLET DAILY     Allergies:   Pepcid [famotidine]; Allopurinol; Amlodipine; Atorvastatin; Contrast media [iodinated diagnostic  agents]; Cephalexin; Ciprofloxacin; Colchicine; Erythromycin; Prilosec [omeprazole]; Sulfonamide derivatives; and Tetracycline   Social History   Tobacco Use   Smoking status: Former Smoker   Smokeless tobacco: Never Used  Substance Use Topics   Alcohol use: No   Drug use: No     Family Hx: The patient's family history includes Brain cancer in her brother; Heart attack in her brother and father; Heart disease in her brother, father, and mother; Hypertension in her brother, mother, and sister; Melanoma in her brother; Ovarian cancer in her sister; Rheum arthritis in her sister; Stroke in her mother. There is no history of Colon cancer.  ROS:   Please see the history of present illness.     All other systems reviewed and are negative.   Labs/Other Tests and Data Reviewed:    Recent Labs: No results found for requested labs within last 8760 hours.   Recent Lipid Panel Lab Results  Component Value Date/Time   CHOL 153 01/07/2015 03:04 AM   TRIG 197 (H) 04/22/2017 04:19 AM   HDL 51 01/07/2015 03:04 AM   CHOLHDL 3.0 01/07/2015 03:04 AM   LDLCALC 89 01/07/2015 03:04 AM    Wt Readings from Last 3 Encounters:  10/30/18 134 lb (60.8 kg)  10/13/18 134 lb (60.8 kg)  04/21/18 136 lb (61.7 kg)     Objective:    Vital Signs:  BP 116/75 Comment: Right 100/74   Pulse 95    Ht 5\' 7"  (1.702 m)    Wt 134 lb (60.8 kg)    BMI 20.99 kg/m    CONSTITUTIONAL:  Well nourished, well developed female in no acute distress.  EYES: anicteric MOUTH: oral mucosa is pink RESPIRATORY: Normal respiratory effort, symmetric expansion CARDIOVASCULAR: No peripheral edema SKIN: No rash, lesions or ulcers MUSCULOSKELETAL: no digital cyanosis NEURO: Cranial Nerves II-XII grossly intact, moves all extremities PSYCH: Intact judgement and insight.  A&O x 3, Mood/affect appropriate   ASSESSMENT & PLAN:    1.  Paroxysmal atrial fibrillation - she is s/p MAZE procedure at the time of her MV repair.   She does not think that she has had any afib and denies any palpitations.  She will continue on warfarin. She is now on Lopressor due to severe fatigue and dizziness.  We will follow her on pacer checks to see if she has any PAF breakthrough.   2.  Hypertension - she checks her BP at home and it controlled.    She will continue on Amiloride-HCT 2.5/25mg  daily which is prescribed by her nephrologist.  3.  Severe MR - she is s/p MV repair and 2D  02/2018 showed trivial MR.  4.  PVCs - she has not had any palpitations.   5.  High grade AV block - she is s/p PPM and followed in device clinic.  6.  COVID-19 Education:The signs and symptoms of COVID-19 were discussed with the patient and how to seek care for testing (follow up with PCP or arrange E-visit).  The importance of social distancing was discussed today.  7.  CKD stage 3 - she is followed by Dr. [name] with nephrology and is on a diuretic.  Patient Risk:   After full review of this patient's clinical status, I feel that they are at least moderate risk at this time.  Time:   Today, I have spent 15 minutes directly with the patient on video discussing medical problems including dizziness, hypertension, atrial fibrillation, MR and PPM.  We also reviewed the symptoms of COVID 19 and the ways to protect against contracting the virus with telehealth technology.  I spent an additional 5 minutes reviewing patient's chart including prior notes..  Medication Adjustments/Labs and Tests Ordered: Current medicines are reviewed at length with the patient today.  Concerns regarding medicines are outlined above.  Tests Ordered: No orders of the defined types were placed in this encounter.  Medication Changes: No orders of the defined types were placed in this encounter.   Disposition:  Follow up in 6 month(s)  Signed, Armanda Magic, MD  10/30/2018 10:25 AM    [facility]

## 2018-10-30 ENCOUNTER — Telehealth (INDEPENDENT_AMBULATORY_CARE_PROVIDER_SITE_OTHER): Payer: Medicare Other | Admitting: Cardiology

## 2018-10-30 ENCOUNTER — Encounter: Payer: Self-pay | Admitting: Cardiology

## 2018-10-30 ENCOUNTER — Other Ambulatory Visit: Payer: Self-pay

## 2018-10-30 VITALS — BP 116/75 | HR 95 | Ht 67.0 in | Wt 134.0 lb

## 2018-10-30 DIAGNOSIS — N183 Chronic kidney disease, stage 3 unspecified: Secondary | ICD-10-CM

## 2018-10-30 DIAGNOSIS — Z7189 Other specified counseling: Secondary | ICD-10-CM | POA: Diagnosis not present

## 2018-10-30 DIAGNOSIS — I10 Essential (primary) hypertension: Secondary | ICD-10-CM

## 2018-10-30 DIAGNOSIS — I340 Nonrheumatic mitral (valve) insufficiency: Secondary | ICD-10-CM

## 2018-10-30 DIAGNOSIS — I480 Paroxysmal atrial fibrillation: Secondary | ICD-10-CM | POA: Diagnosis not present

## 2018-10-30 DIAGNOSIS — Z950 Presence of cardiac pacemaker: Secondary | ICD-10-CM

## 2018-10-30 DIAGNOSIS — I4430 Unspecified atrioventricular block: Secondary | ICD-10-CM | POA: Diagnosis not present

## 2018-10-30 DIAGNOSIS — I493 Ventricular premature depolarization: Secondary | ICD-10-CM

## 2018-10-30 NOTE — Patient Instructions (Signed)
Medication Instructions:  Your physician recommends that you continue on your current medications as directed. Please refer to the Current Medication list given to you today.  If you need a refill on your cardiac medications before your next appointment, please call your pharmacy.   Lab work: None If you have labs (blood work) drawn today and your tests are completely normal, you will receive your results only by: [name] MyChart Message (if you have MyChart) OR . A paper copy in the mail If you have any lab test that is abnormal or we need to change your treatment, we will call you to review the results.  Testing/Procedures: None  Follow-Up: On 7/21 at 8:40 am

## 2018-11-06 ENCOUNTER — Telehealth: Payer: Self-pay

## 2018-11-06 NOTE — Telephone Encounter (Signed)

## 2018-11-07 ENCOUNTER — Other Ambulatory Visit: Payer: Self-pay

## 2018-11-07 ENCOUNTER — Ambulatory Visit (INDEPENDENT_AMBULATORY_CARE_PROVIDER_SITE_OTHER): Payer: Medicare Other | Admitting: Pharmacist

## 2018-11-07 DIAGNOSIS — I480 Paroxysmal atrial fibrillation: Secondary | ICD-10-CM

## 2018-11-07 DIAGNOSIS — Z7901 Long term (current) use of anticoagulants: Secondary | ICD-10-CM | POA: Diagnosis not present

## 2018-11-07 DIAGNOSIS — Z8673 Personal history of transient ischemic attack (TIA), and cerebral infarction without residual deficits: Secondary | ICD-10-CM

## 2018-11-07 DIAGNOSIS — Z5181 Encounter for therapeutic drug level monitoring: Secondary | ICD-10-CM

## 2018-11-07 LAB — POCT INR: INR: 1.8 — AB (ref 2.0–3.0)

## 2018-11-10 ENCOUNTER — Telehealth: Payer: Self-pay | Admitting: Pharmacist

## 2018-11-10 NOTE — Telephone Encounter (Signed)
Called and left message on voicemail. Calling to let patient/daughter know that Dr. [name] is ok with patient switching back to Eliquis.

## 2018-11-11 ENCOUNTER — Telehealth: Payer: Self-pay | Admitting: Pharmacist Clinician (PhC)/ Clinical Pharmacy Specialist

## 2018-11-11 MED ORDER — APIXABAN 5 MG PO TABS: 5.0000 mg | ORAL_TABLET | Freq: Two times a day (BID) | ORAL | 0 refills | Status: DC

## 2018-11-11 NOTE — Telephone Encounter (Signed)
Had discussion with daughter about switching patient from warfarin to Eliquis.  Patient had been on previously and would be fine with going back.  Daughter understands switch will be made at next INR appointment, scheduled for May 15.  Rx sent to [facility]  Age      78 (until October 2020) Weight      61.7 kg SCr           1.48  (Feb 2020)  Current dose will be 5 mg bid, however we will need to watch her regularly as all values are close to cut off range for 2.5 mg dose  Did not d/c warfarin from profile today, as patient will continue to take for 10 more days.

## 2018-11-17 ENCOUNTER — Other Ambulatory Visit: Payer: Self-pay

## 2018-11-17 ENCOUNTER — Ambulatory Visit (INDEPENDENT_AMBULATORY_CARE_PROVIDER_SITE_OTHER): Payer: Medicare Other | Admitting: *Deleted

## 2018-11-17 DIAGNOSIS — I480 Paroxysmal atrial fibrillation: Secondary | ICD-10-CM

## 2018-11-17 DIAGNOSIS — Z8673 Personal history of transient ischemic attack (TIA), and cerebral infarction without residual deficits: Secondary | ICD-10-CM

## 2018-11-18 LAB — CUP PACEART REMOTE DEVICE CHECK
Date Time Interrogation Session: 20200512114749
Implantable Lead Implant Date: 20160701
Implantable Lead Implant Date: 20160701
Implantable Lead Location: 753859
Implantable Lead Location: 753860
Implantable Pulse Generator Implant Date: 20160701
Pulse Gen Model: 2240
Pulse Gen Serial Number: 7785935

## 2018-11-20 ENCOUNTER — Telehealth: Payer: Self-pay

## 2018-11-20 NOTE — Telephone Encounter (Signed)

## 2018-11-21 ENCOUNTER — Other Ambulatory Visit: Payer: Self-pay

## 2018-11-21 ENCOUNTER — Ambulatory Visit (INDEPENDENT_AMBULATORY_CARE_PROVIDER_SITE_OTHER): Payer: Medicare Other | Admitting: Pharmacist

## 2018-11-21 DIAGNOSIS — Z5181 Encounter for therapeutic drug level monitoring: Secondary | ICD-10-CM | POA: Diagnosis not present

## 2018-11-21 DIAGNOSIS — I480 Paroxysmal atrial fibrillation: Secondary | ICD-10-CM

## 2018-11-21 DIAGNOSIS — Z8673 Personal history of transient ischemic attack (TIA), and cerebral infarction without residual deficits: Secondary | ICD-10-CM

## 2018-11-21 DIAGNOSIS — Z7901 Long term (current) use of anticoagulants: Secondary | ICD-10-CM

## 2018-11-21 LAB — POCT INR: INR: 1.2 — AB (ref 2.0–3.0)

## 2018-11-25 ENCOUNTER — Ambulatory Visit: Payer: Medicare Other | Admitting: Cardiology

## 2018-11-25 NOTE — Progress Notes (Signed)
Remote pacemaker transmission.   

## 2018-12-26 ENCOUNTER — Other Ambulatory Visit: Payer: Self-pay

## 2018-12-26 ENCOUNTER — Observation Stay (HOSPITAL_COMMUNITY)
Admission: EM | Admit: 2018-12-26 | Discharge: 2018-12-27 | Disposition: A | Discharge status: Home / Self Care | Payer: Medicare Other | Attending: Family Medicine | Admitting: Family Medicine

## 2018-12-26 ENCOUNTER — Encounter (HOSPITAL_COMMUNITY): Payer: Self-pay

## 2018-12-26 ENCOUNTER — Emergency Department (HOSPITAL_COMMUNITY): Payer: Medicare Other

## 2018-12-26 DIAGNOSIS — Z7951 Long term (current) use of inhaled steroids: Secondary | ICD-10-CM | POA: Insufficient documentation

## 2018-12-26 DIAGNOSIS — Z87891 Personal history of nicotine dependence: Secondary | ICD-10-CM | POA: Diagnosis not present

## 2018-12-26 DIAGNOSIS — R410 Disorientation, unspecified: Secondary | ICD-10-CM | POA: Diagnosis present

## 2018-12-26 DIAGNOSIS — I340 Nonrheumatic mitral (valve) insufficiency: Secondary | ICD-10-CM | POA: Insufficient documentation

## 2018-12-26 DIAGNOSIS — Z8673 Personal history of transient ischemic attack (TIA), and cerebral infarction without residual deficits: Secondary | ICD-10-CM | POA: Diagnosis not present

## 2018-12-26 DIAGNOSIS — I129 Hypertensive chronic kidney disease with stage 1 through stage 4 chronic kidney disease, or unspecified chronic kidney disease: Secondary | ICD-10-CM | POA: Diagnosis not present

## 2018-12-26 DIAGNOSIS — R4789 Other speech disturbances: Secondary | ICD-10-CM

## 2018-12-26 DIAGNOSIS — J90 Pleural effusion, not elsewhere classified: Secondary | ICD-10-CM | POA: Insufficient documentation

## 2018-12-26 DIAGNOSIS — I480 Paroxysmal atrial fibrillation: Secondary | ICD-10-CM | POA: Insufficient documentation

## 2018-12-26 DIAGNOSIS — N183 Chronic kidney disease, stage 3 unspecified: Secondary | ICD-10-CM | POA: Diagnosis present

## 2018-12-26 DIAGNOSIS — E785 Hyperlipidemia, unspecified: Secondary | ICD-10-CM | POA: Diagnosis not present

## 2018-12-26 DIAGNOSIS — Z7901 Long term (current) use of anticoagulants: Secondary | ICD-10-CM | POA: Diagnosis not present

## 2018-12-26 DIAGNOSIS — F0390 Unspecified dementia without behavioral disturbance: Secondary | ICD-10-CM | POA: Insufficient documentation

## 2018-12-26 DIAGNOSIS — Z79899 Other long term (current) drug therapy: Secondary | ICD-10-CM | POA: Diagnosis not present

## 2018-12-26 DIAGNOSIS — E876 Hypokalemia: Secondary | ICD-10-CM | POA: Diagnosis present

## 2018-12-26 DIAGNOSIS — Z1159 Encounter for screening for other viral diseases: Secondary | ICD-10-CM | POA: Insufficient documentation

## 2018-12-26 DIAGNOSIS — I441 Atrioventricular block, second degree: Secondary | ICD-10-CM | POA: Insufficient documentation

## 2018-12-26 DIAGNOSIS — K589 Irritable bowel syndrome without diarrhea: Secondary | ICD-10-CM | POA: Diagnosis not present

## 2018-12-26 DIAGNOSIS — Z950 Presence of cardiac pacemaker: Secondary | ICD-10-CM | POA: Insufficient documentation

## 2018-12-26 DIAGNOSIS — Z79890 Hormone replacement therapy (postmenopausal): Secondary | ICD-10-CM | POA: Diagnosis not present

## 2018-12-26 DIAGNOSIS — G9340 Encephalopathy, unspecified: Secondary | ICD-10-CM | POA: Diagnosis not present

## 2018-12-26 DIAGNOSIS — R3129 Other microscopic hematuria: Secondary | ICD-10-CM | POA: Diagnosis not present

## 2018-12-26 DIAGNOSIS — R531 Weakness: Secondary | ICD-10-CM | POA: Diagnosis not present

## 2018-12-26 DIAGNOSIS — G3184 Mild cognitive impairment, so stated: Secondary | ICD-10-CM | POA: Diagnosis not present

## 2018-12-26 DIAGNOSIS — E039 Hypothyroidism, unspecified: Secondary | ICD-10-CM | POA: Diagnosis not present

## 2018-12-26 DIAGNOSIS — I10 Essential (primary) hypertension: Secondary | ICD-10-CM | POA: Diagnosis present

## 2018-12-26 DIAGNOSIS — I493 Ventricular premature depolarization: Secondary | ICD-10-CM | POA: Insufficient documentation

## 2018-12-26 LAB — URINALYSIS, ROUTINE W REFLEX MICROSCOPIC
Bacteria, UA: NONE SEEN
Bilirubin Urine: NEGATIVE
Glucose, UA: NEGATIVE mg/dL
Ketones, ur: NEGATIVE mg/dL
Leukocytes,Ua: NEGATIVE
Nitrite: NEGATIVE
Protein, ur: NEGATIVE mg/dL
Specific Gravity, Urine: 1.011 (ref 1.005–1.030)
pH: 5 (ref 5.0–8.0)

## 2018-12-26 LAB — CBC WITH DIFFERENTIAL/PLATELET
Abs Immature Granulocytes: 0.04 10*3/uL (ref 0.00–0.07)
Basophils Absolute: 0.1 10*3/uL (ref 0.0–0.1)
Basophils Relative: 1 %
Eosinophils Absolute: 0.3 10*3/uL (ref 0.0–0.5)
Eosinophils Relative: 3 %
HCT: 43.6 % (ref 36.0–46.0)
Hemoglobin: 14.3 g/dL (ref 12.0–15.0)
Immature Granulocytes: 0 %
Lymphocytes Relative: 32 %
Lymphs Abs: 3.3 10*3/uL (ref 0.7–4.0)
MCH: 31.4 pg (ref 26.0–34.0)
MCHC: 32.8 g/dL (ref 30.0–36.0)
MCV: 95.8 fL (ref 80.0–100.0)
Monocytes Absolute: 0.8 10*3/uL (ref 0.1–1.0)
Monocytes Relative: 7 %
Neutro Abs: 5.8 10*3/uL (ref 1.7–7.7)
Neutrophils Relative %: 57 %
Platelets: 248 10*3/uL (ref 150–400)
RBC: 4.55 MIL/uL (ref 3.87–5.11)
RDW: 12.5 % (ref 11.5–15.5)
WBC: 10.3 10*3/uL (ref 4.0–10.5)
nRBC: 0 % (ref 0.0–0.2)

## 2018-12-26 LAB — COMPREHENSIVE METABOLIC PANEL
ALT: 11 U/L (ref 0–44)
AST: 17 U/L (ref 15–41)
Albumin: 4.3 g/dL (ref 3.5–5.0)
Alkaline Phosphatase: 61 U/L (ref 38–126)
Anion gap: 12 (ref 5–15)
BUN: 23 mg/dL (ref 8–23)
CO2: 29 mmol/L (ref 22–32)
Calcium: 10.2 mg/dL (ref 8.9–10.3)
Chloride: 101 mmol/L (ref 98–111)
Creatinine, Ser: 1.36 mg/dL — ABNORMAL HIGH (ref 0.44–1.00)
GFR calc Af Amer: 43 mL/min — ABNORMAL LOW (ref >60)
GFR calc non Af Amer: 37 mL/min — ABNORMAL LOW (ref >60)
Glucose, Bld: 116 mg/dL — ABNORMAL HIGH (ref 70–99)
Potassium: 2.8 mmol/L — ABNORMAL LOW (ref 3.5–5.1)
Sodium: 142 mmol/L (ref 135–145)
Total Bilirubin: 0.5 mg/dL (ref 0.3–1.2)
Total Protein: 7.6 g/dL (ref 6.5–8.1)

## 2018-12-26 LAB — MAGNESIUM: Magnesium: 2.1 mg/dL (ref 1.7–2.4)

## 2018-12-26 LAB — LACTIC ACID, PLASMA: Lactic Acid, Venous: 1.2 mmol/L (ref 0.5–1.9)

## 2018-12-26 LAB — SARS CORONAVIRUS 2 BY RT PCR (HOSPITAL ORDER, PERFORMED IN ~~LOC~~ HOSPITAL LAB): SARS Coronavirus 2: NEGATIVE

## 2018-12-26 LAB — TROPONIN I: Troponin I: <0.03 ng/mL (ref <0.03)

## 2018-12-26 MED ORDER — SODIUM CHLORIDE 0.9 % IV BOLUS
500.0000 mL | Freq: Once | INTRAVENOUS | Status: AC
  Administered 2018-12-26: 500 mL via INTRAVENOUS

## 2018-12-26 MED ORDER — POTASSIUM CHLORIDE 10 MEQ/100ML IV SOLN
10.0000 meq | Freq: Once | INTRAVENOUS | Status: AC
  Administered 2018-12-26: 10 meq via INTRAVENOUS
  Filled 2018-12-26: qty 100

## 2018-12-26 MED ORDER — POTASSIUM CHLORIDE CRYS ER 20 MEQ PO TBCR
40.0000 meq | EXTENDED_RELEASE_TABLET | Freq: Once | ORAL | Status: AC
  Administered 2018-12-26: 40 meq via ORAL
  Filled 2018-12-26: qty 2

## 2018-12-26 NOTE — ED Triage Notes (Signed)
Pt daughter reports LKW of around 2p yesterday. Daughter states that when patient came over for dinner tonight, she was confused and having trouble finding her words. Pt reports that she feels weak (not unilateral). No facial droop. A&Ox4 and ambulatory.

## 2018-12-26 NOTE — ED Notes (Signed)
Pt ambulated to BR from stretcher and is now being transported to MRI

## 2018-12-26 NOTE — ED Notes (Signed)
Daughter, Adonis Housekeeper, can be reached with any questions or updates [phone].

## 2018-12-26 NOTE — ED Provider Notes (Signed)
[facility]-EMERGENCY DEPT Provider Note   CSN: [id number] Arrival date & time: 12/26/18  2023    History   Chief Complaint Chief Complaint  Patient presents with  . Altered Mental Status    HPI Amanda [name] is a 78 y.o. female.     Amanda [name] is a 78 y.o. female with a history of hypertension, hyperlipidemia, hypothyroidism, CKD, mitral regurg, IBS, paroxysmal A. fib on Eliquis, stroke, who presents to the emergency department for evaluation of short episode this evening where she seemed to have confused speech.  Patient's daughter reports that patient was repeatedly saying I do not know to question she would usually know the answer to and she seemed to have difficulty getting her words out daughter reports it seems as though she knows what she wanted to say but she was unable to say it and instead a sentence that did not really make sense would come out.  Upon arrival to the emergency department the symptoms seem to have resolved.  Patient reports no associated headache or vision changes.  She reports that she felt a bit generally weak in her arms but did not have any focal weakness or numbness in her extremity, no facial asymmetry.  She denies any lightheadedness or syncope.  No associated chest pain or shortness of breath.  No abdominal pain, nausea or vomiting.  No known sick contacts, no recent fevers.  Daughter reports that she was last seen normal prior to this evening at 2 PM yesterday when she visited her mother.  They checked her medications at home and it appears she has been taking them regularly.  Daughter does report that over the past few days she has had some fluctuations in blood pressure had previously been taken off of all of her blood pressure medications due to lower blood pressures but was recently restarted on half tablet of amilioride-HCTZ     Past Medical History:  Diagnosis Date  . Allergic rhinitis   . Anemia   . Asymptomatic  LV dysfunction    EF 45-50% echo 07/2017  . CKD (chronic kidney disease), stage III (HCC)    stage III  . DJD (degenerative joint disease)   . Glomerulonephritis    Dr Darrick Pennaeterding  . Hyperlipidemia   . Hyperparathyroidism (HCC)   . Hypertension   . Hypothyroidism   . IBS (irritable bowel syndrome)   . Interstitial cystitis   . Mitral regurgitation   . Mobitz II    a. s/p STJ dual chamber PPM   . MVP (mitral valve prolapse)    moderate posterior MVP with moderate MR and grade II diasotlic dysfunction  . PAC (premature atrial contraction)   . PAF (paroxysmal atrial fibrillation) (HCC)     note on pacer check. CHADS2VASC score is 4 now on Eliquis.  [name]. PVC's (premature ventricular contractions)   . S/P minimally invasive maze operation for atrial fibrillation 04/16/2017   Complete bilateral atrial lesion set using cryothermy and bipolar radiofrequency ablation with clipping of LA appendage via right mini thoracotomy approach  . S/P minimally invasive mitral valve repair 04/16/2017   Complex valvuloplasty including triangular resection of posterior leaflet, artificial Gore-tex neochords x6 and Sorin Memo 3D ring annuloplasty ([id number], size 32, serial # [id number])  . S/P placement of cardiac pacemaker   . Small vessel disease, cerebrovascular   . Stroke [facility](HCC)    a. old stroke seen on imaging.    Patient Active Problem List   Diagnosis Date Noted  .  Hypokalemia 12/27/2018  . Dementia (HCC) 12/27/2018  . PVC's (premature ventricular contractions) 04/16/2018  . Pleural effusion on right 05/22/2017  . Pleural effusion 05/22/2017  . Chronic anticoagulation   . Oral thrush   . Subtherapeutic anticoagulation   . Sternal pain   . SOB (shortness of breath)   . Stage 3 chronic kidney disease (HCC)   . Debility 04/26/2017  . History of CVA (cerebrovascular accident)   . S/P placement of cardiac pacemaker   . Post-operative pain   . Labile blood glucose   . Hypernatremia   . S/P minimally  invasive mitral valve repair + maze procedure 04/16/2017  . S/P minimally invasive maze operation for atrial fibrillation 04/16/2017  . Paroxysmal atrial fibrillation (HCC)   . Acute delirium 04/28/2015  . Acute UTI 04/01/2015  . Acute encephalopathy 04/01/2015  . AKI (acute kidney injury) (HCC) 04/01/2015  . Leukocytosis 04/01/2015  . Thrombocytopenia (HCC) 04/01/2015  . Hyponatremia 04/01/2015  . Hypothyroidism 04/01/2015  . CKD (chronic kidney disease), stage III (HCC) 04/01/2015  . UTI (lower urinary tract infection)   . Severe sepsis without septic shock (HCC)   . Pacemaker 02/03/2015  . AV block 01/06/2015  . RBBB 05/21/2014  . Mitral regurgitation 11/26/2013  . PAC (premature atrial contraction)   . DIARRHEA 07/24/2010  . GLOMERULONEPHRITIS 03/27/2010  . COLITIS 10/06/2009  . Essential hypertension 10/05/2009  . DYSPHAGIA UNSPECIFIED 10/05/2009  . Personal history of other endocrine, metabolic, and immunity disorders 10/05/2009    Past Surgical History:  Procedure Laterality Date  . ABDOMINAL HYSTERECTOMY    . BREAST BIOPSY Right   . CHEST TUBE INSERTION Right 05/23/2017   Procedure: INSERTION PLEURAL DRAINAGE CATHETER;  Surgeon: Purcell Nails, MD;  Location: [facility] OR;  Service: Thoracic;  Laterality: Right;  possible pleurex catheter  . CLIPPING OF ATRIAL APPENDAGE  04/16/2017   Procedure: CLIPPING OF ATRIAL APPENDAGE using AtriCure Pro2 clip 45;  Surgeon: Purcell Nails, MD;  Location: [facility] OR;  Service: Open Heart Surgery;;  . CYSTOSTOMY W/ BLADDER BIOPSY    . EP IMPLANTABLE DEVICE N/A 01/07/2015   STJ dual chamber pacemaker implanted by Dr [name] for 2:1 heart block  . MINIMALLY INVASIVE MAZE PROCEDURE N/A 04/16/2017   Procedure: MINIMALLY INVASIVE MAZE PROCEDURE;  Surgeon: Purcell Nails, MD;  Location: [facility] OR;  Service: Open Heart Surgery;  Laterality: N/A;  . MITRAL VALVE REPAIR Right 04/16/2017   Procedure: MINIMALLY INVASIVE MITRAL VALVE REPAIR (MVR);  Surgeon:  Purcell Nails, MD;  Location: [facility] OR;  Service: Open Heart Surgery;  Laterality: Right;  . MITRAL VALVE REPAIR Right 04/16/2017   Procedure: RE-EXPLORATION RIGHT THORACOTOMY FOR BLEEDING;  Surgeon: Purcell Nails, MD;  Location: [facility] OR;  Service: Open Heart Surgery;  Laterality: Right;  . PERIPHERAL VASCULAR CATHETERIZATION N/A 02/03/2015   Procedure: Upper Extremity Venography;  Surgeon: [name], MD;  Location: [facility] INVASIVE CV LAB;  Service: Cardiovascular;  Laterality: N/A;  . REMOVAL OF PLEURAL DRAINAGE CATHETER Right 09/03/2017   Procedure: REMOVAL OF PLEURAL DRAINAGE CATHETER;  Surgeon: Purcell Nails, MD;  Location: [facility] OR;  Service: Thoracic;  Laterality: Right;  . RIGHT/LEFT HEART CATH AND CORONARY ANGIOGRAPHY N/A 03/26/2017   Procedure: RIGHT/LEFT HEART CATH AND CORONARY ANGIOGRAPHY;  Surgeon: [name], MD;  Location: [facility] INVASIVE CV LAB;  Service: Cardiovascular;  Laterality: N/A;  . TEE WITHOUT CARDIOVERSION N/A 03/18/2017   Procedure: TRANSESOPHAGEAL ECHOCARDIOGRAM (TEE);  Surgeon: [name], MD;  Location: [facility] ENDOSCOPY;  Service:  Cardiovascular;  Laterality: N/A;  . TEE WITHOUT CARDIOVERSION N/A 04/16/2017   Procedure: TRANSESOPHAGEAL ECHOCARDIOGRAM (TEE);  Surgeon: Purcell Nails, MD;  Location: [facility] OR;  Service: Open Heart Surgery;  Laterality: N/A;  . TONSILLECTOMY       OB History   No obstetric history on file.      Home Medications    Prior to Admission medications   Medication Sig Start Date End Date Taking? Authorizing Provider  acetaminophen (TYLENOL) 500 MG tablet Take 500 mg by mouth every 6 (six) hours as needed for moderate pain.    Yes [provider]  amiloride-hydrochlorothiazide (MODURETIC) 5-50 MG tablet Take 0.5 tablets by mouth daily.   Yes [provider]  apixaban (ELIQUIS) 5 MG TABS tablet Take 1 tablet (5 mg total) by mouth 2 (two) times daily. 11/11/18  Yes Turner, Cornelious Bryant, MD  calcitRIOL (ROCALTROL) 0.25 MCG  capsule Take 0.25 mcg by mouth every other day. In the morning    Yes [provider]  conjugated estrogens (PREMARIN) vaginal cream Place 1 Applicatorful vaginally at bedtime.    Yes [provider]  donepezil (ARICEPT) 5 MG tablet Take 5 mg by mouth daily. 09/23/18  Yes [provider]  fexofenadine (ALLEGRA) 180 MG tablet Take 180 mg by mouth daily.   Yes [provider]  fluticasone (FLONASE) 50 MCG/ACT nasal spray Place 2 sprays into both nostrils daily.    Yes [provider]  levothyroxine (SYNTHROID, LEVOTHROID) 75 MCG tablet Take 75 mcg by mouth daily.   Yes [provider]  vitamin B-12 (CYANOCOBALAMIN) 1000 MCG tablet Take 1,000 mcg by mouth once a week.   Yes [provider]    Family History Family History  Problem Relation Age of Onset  . Heart disease Mother   . Stroke Mother   . Hypertension Mother   . Heart disease Father   . Heart attack Father   . Ovarian cancer Sister   . Heart disease Brother   . Rheum arthritis Sister   . Melanoma Brother   . Brain cancer Brother   . Heart attack Brother   . Hypertension Brother   . Hypertension Sister   . Colon cancer Neg Hx     Social History Social History   Tobacco Use  . Smoking status: Former Games developer  . Smokeless tobacco: Never Used  Substance Use Topics  . Alcohol use: No  . Drug use: No     Allergies   Pepcid [famotidine], Allopurinol, Amlodipine, Atorvastatin, Contrast media [iodinated diagnostic agents], Cephalexin, Ciprofloxacin, Colchicine, Erythromycin, Prilosec [omeprazole], Sulfonamide derivatives, and Tetracycline   Review of Systems Review of Systems  Constitutional: Negative for chills and fever.  HENT: Negative.   Eyes: Negative for visual disturbance.  Respiratory: Negative for cough and shortness of breath.   Cardiovascular: Negative for chest pain.  Gastrointestinal: Negative for abdominal pain, nausea and vomiting.   Genitourinary: Negative for dysuria and frequency.  Musculoskeletal: Negative for arthralgias, myalgias and neck pain.  Skin: Negative for color change and rash.  Neurological: Positive for speech difficulty. Negative for dizziness, tremors, seizures, syncope, facial asymmetry, weakness, light-headedness, numbness and headaches.  Psychiatric/Behavioral: Positive for confusion.     Physical Exam Updated Vital Signs BP (!) 193/96 (BP Location: Left Arm)   Pulse 92   Temp 98.2 F (36.8 C) (Oral)   Resp 17   SpO2 100%   Physical Exam Vitals signs and nursing note reviewed.  Constitutional:      General:  She is not in acute distress.    Appearance: She is well-developed. She is not diaphoretic.  HENT:     Head: Normocephalic and atraumatic.  Eyes:     General:        Right eye: No discharge.        Left eye: No discharge.     Pupils: Pupils are equal, round, and reactive to light.  Neck:     Musculoskeletal: Neck supple.  Cardiovascular:     Rate and Rhythm: Normal rate and regular rhythm.     Pulses: Normal pulses.          Radial pulses are 2+ on the right side and 2+ on the left side.       Dorsalis pedis pulses are 2+ on the right side and 2+ on the left side.     Heart sounds: Normal heart sounds. No murmur. No friction rub. No gallop.   Pulmonary:     Effort: Pulmonary effort is normal. No respiratory distress.     Breath sounds: Normal breath sounds. No wheezing or rales.     Comments: Respirations equal and unlabored, patient able to speak in full sentences, lungs clear to auscultation bilaterally Abdominal:     General: Bowel sounds are normal. There is no distension.     Palpations: Abdomen is soft. There is no mass.     Tenderness: There is no abdominal tenderness. There is no guarding.     Comments: Abdomen soft, nondistended, nontender to palpation in all quadrants without guarding or peritoneal signs  Musculoskeletal:        General: No deformity.  Skin:     General: Skin is warm and dry.     Capillary Refill: Capillary refill takes less than 2 seconds.  Neurological:     Mental Status: She is alert and oriented to person, place, and time.     Coordination: Coordination normal.     Comments: Speech is clear, able to follow commands CN III-XII intact Normal strength in upper and lower extremities bilaterally including dorsiflexion and plantar flexion, strong and equal grip strength Sensation normal to light and sharp touch Moves extremities without ataxia, coordination intact   Psychiatric:        Mood and Affect: Mood normal.        Behavior: Behavior normal.      ED Treatments / Results  Labs (all labs ordered are listed, but only abnormal results are displayed) Labs Reviewed  COMPREHENSIVE METABOLIC PANEL - Abnormal; Notable for the following components:      Result Value   Potassium 2.8 (*)    Glucose, Bld 116 (*)    Creatinine, Ser 1.36 (*)    GFR calc non Af Amer 37 (*)    GFR calc Af Amer 43 (*)    All other components within normal limits  URINALYSIS, ROUTINE W REFLEX MICROSCOPIC - Abnormal; Notable for the following components:   Hgb urine dipstick MODERATE (*)    All other components within normal limits  SARS CORONAVIRUS 2 (HOSPITAL ORDER, PERFORMED IN [facility])  URINE CULTURE  CBC WITH DIFFERENTIAL/PLATELET  LACTIC ACID, PLASMA  TROPONIN I  MAGNESIUM  BASIC METABOLIC PANEL  CBC  TSH  AMMONIA  VITAMIN B12  FOLATE RBC  RPR  HIV ANTIBODY (ROUTINE TESTING W REFLEX)    EKG EKG Interpretation  Date/Time:  Friday December 26 2018 20:36:18 EDT Ventricular Rate:  94 PR Interval:    QRS Duration: 176 QT Interval:  440 QTC Calculation: 551 R Axis:   -88 Text Interpretation:  Ventricular-paced rhythm Confirmed by Aletta Edouard [phone]) on 12/26/2018 9:18:38 PM   Radiology Ct Head Wo Contrast  Result Date: 12/26/2018 CLINICAL DATA:  Altered level of consciousness (LOC), unexplained. Confusion.  EXAM: CT HEAD WITHOUT CONTRAST TECHNIQUE: Contiguous axial images were obtained from the base of the skull through the vertex without intravenous contrast. COMPARISON:  Head CT 04/19/2017 FINDINGS: Brain: No evidence of acute infarction, hemorrhage, hydrocephalus, extra-axial collection or mass lesion/mass effect. Generalized atrophy and moderate to advanced chronic small vessel ischemia, similar to prior exam. Remote right parietal infarct with encephalomalacia, also unchanged. Remote lacunar infarcts in the right basal ganglia. Vascular: Atherosclerosis of skullbase vasculature without hyperdense vessel or abnormal calcification. Skull: No fracture or focal lesion. Sinuses/Orbits: Paranasal sinuses and mastoid air cells are clear. The visualized orbits are unremarkable. Bilateral cataract resection. Other: None. IMPRESSION: 1. No acute intracranial abnormality. 2. Stable atrophy. Stable chronic small vessel ischemia and remote parietal infarct. Electronically Signed   By: Keith Rake M.D.   On: 12/26/2018 22:19   Dg Chest Port 1 View  Result Date: 12/26/2018 CLINICAL DATA:  Altered mental status EXAM: PORTABLE CHEST 1 VIEW COMPARISON:  08/19/2017 FINDINGS: Left-sided pacing device as before. Valve prosthesis and vascular clip. Small right-sided pleural effusion. Mild cardiomegaly with aortic atherosclerosis. No pneumothorax. IMPRESSION: 1. Cardiomegaly. 2. Small right-sided pleural effusion with hazy atelectasis at the right base. Electronically Signed   By: Donavan Foil M.D.   On: 12/26/2018 21:14    Procedures Procedures (including critical care time)  Medications Ordered in ED Medications  donepezil (ARICEPT) tablet 5 mg (has no administration in time range)  calcitRIOL (ROCALTROL) capsule 0.25 mcg (has no administration in time range)  levothyroxine (SYNTHROID) tablet 75 mcg (has no administration in time range)  apixaban (ELIQUIS) tablet 5 mg (has no administration in time range)   triamterene-hydrochlorothiazide (MAXZIDE) 75-50 MG per tablet 1 tablet (has no administration in time range)  sodium chloride flush (NS) 0.9 % injection 3 mL (has no administration in time range)  sodium chloride flush (NS) 0.9 % injection 3 mL (has no administration in time range)  sodium chloride flush (NS) 0.9 % injection 3 mL (has no administration in time range)  0.9 %  sodium chloride infusion (has no administration in time range)  acetaminophen (TYLENOL) tablet 650 mg (has no administration in time range)    Or  acetaminophen (TYLENOL) suppository 650 mg (has no administration in time range)  senna-docusate (Senokot-S) tablet 1 tablet (has no administration in time range)  ondansetron (ZOFRAN) tablet 4 mg (has no administration in time range)    Or  ondansetron (ZOFRAN) injection 4 mg (has no administration in time range)  potassium chloride SA (K-DUR) CR tablet 40 mEq (40 mEq Oral Given 12/26/18 2237)  potassium chloride 10 mEq in 100 mL IVPB (0 mEq Intravenous Stopped 12/26/18 2354)  sodium chloride 0.9 % bolus 500 mL (0 mLs Intravenous Stopped 12/26/18 2354)  sodium chloride 0.9 % bolus 500 mL (0 mLs Intravenous Stopped 12/27/18 0052)     Initial Impression / Assessment and Plan / ED Course  I have reviewed the triage vital signs and the nursing notes.  Pertinent labs & imaging results that were available during my care of the patient were reviewed by me and considered in my medical decision making (see chart for details).  Presents with 20 to 30-minute episode of confused speech this evening, last known well time yesterday  afternoon when daughter saw the patient.  Has history of stroke and chronic small vessel disease of the brain, daughter concern for possible stroke or TIA.  On arrival she is hypertensive but vitals are otherwise normal she has clear normal speech with no expressive aphasia and no focal neurologic deficits noted on exam.  Lungs are clear, abdomen is benign.  No  recent fall or head trauma.  Will get CT of the head will also check labs to look for any metabolic derangements or infection that could cause confusion.  Initial work-up reassuring, EKG shows atrial paced rhythm, troponin is negative.  No leukocytosis and normal hemoglobin.  Po kalemia of 2.8 with normal magnesium, p.o. and IV potassium replacement given.  Creatinine slightly increased from baseline at 1.36 but no other acute electrolyte derangements.  Lactic acid is not elevated, COVID test is negative.  Urinalysis without signs of infection and chest x-ray is clear.  Head CT shows chronic small vessel disease and previous parietal stroke but no acute stroke hemorrhage or other acute abnormality.  Nursing reported another episode of confused speech, where she was repeating questions and could not seem to get out the words she wanted to say at this time she had no other neurologic deficits.  On my reevaluation patient is normal and able to have clear conversation once again.  I discussed and updated patient's daughter.  Will consult with neurology.  Patient is unable to have MRI here due to pacemaker.  Patient is also noted to be hypertensive here, daughter reports recent changes to blood pressure medications but that her blood pressure is not typically this elevated at home, she was just recently placed back on 1/2 tablet of amiloride-HCTZ.  Wonder if hypertension could be contributing to confusion and speech changes.  Case discussed with Dr. Otelia LimesLindzen who feels this may be more so metabolic than acute TIA or stroke.  Does not feel patient needs immediate MRI.  On reevaluation patient appears normal without confusion and is able to have coherent conversation, but given these episodes feel she would benefit from observation admission.  Case discussed with Dr. Antionette Charpyd with [facility] hospitalist who will see and admit the patient.  Patient discussed with Dr. Charm BargesButler, who saw patient as well and agrees with  plan.   Final Clinical Impressions(s) / ED Diagnoses   Final diagnoses:  Episode of change in speech  Confusion  Hypokalemia    ED Discharge Orders    None       Legrand RamsFord, Louvinia Cumbo N, PA-C 12/27/18 0139    Terrilee FilesButler, Michael C, MD 12/27/18 0930

## 2018-12-27 ENCOUNTER — Encounter (HOSPITAL_COMMUNITY): Payer: Self-pay | Admitting: Family Medicine

## 2018-12-27 ENCOUNTER — Observation Stay (HOSPITAL_BASED_OUTPATIENT_CLINIC_OR_DEPARTMENT_OTHER): Payer: Medicare Other

## 2018-12-27 DIAGNOSIS — I480 Paroxysmal atrial fibrillation: Secondary | ICD-10-CM

## 2018-12-27 DIAGNOSIS — F0390 Unspecified dementia without behavioral disturbance: Secondary | ICD-10-CM | POA: Diagnosis present

## 2018-12-27 DIAGNOSIS — I10 Essential (primary) hypertension: Secondary | ICD-10-CM

## 2018-12-27 DIAGNOSIS — G459 Transient cerebral ischemic attack, unspecified: Secondary | ICD-10-CM

## 2018-12-27 DIAGNOSIS — Z8673 Personal history of transient ischemic attack (TIA), and cerebral infarction without residual deficits: Secondary | ICD-10-CM | POA: Diagnosis not present

## 2018-12-27 DIAGNOSIS — E039 Hypothyroidism, unspecified: Secondary | ICD-10-CM

## 2018-12-27 DIAGNOSIS — G9340 Encephalopathy, unspecified: Secondary | ICD-10-CM | POA: Diagnosis not present

## 2018-12-27 DIAGNOSIS — E876 Hypokalemia: Secondary | ICD-10-CM | POA: Diagnosis present

## 2018-12-27 DIAGNOSIS — N183 Chronic kidney disease, stage 3 (moderate): Secondary | ICD-10-CM

## 2018-12-27 LAB — BASIC METABOLIC PANEL
Anion gap: 11 (ref 5–15)
BUN: 21 mg/dL (ref 8–23)
CO2: 26 mmol/L (ref 22–32)
Calcium: 9.6 mg/dL (ref 8.9–10.3)
Chloride: 106 mmol/L (ref 98–111)
Creatinine, Ser: 1.29 mg/dL — ABNORMAL HIGH (ref 0.44–1.00)
GFR calc Af Amer: 46 mL/min — ABNORMAL LOW (ref >60)
GFR calc non Af Amer: 39 mL/min — ABNORMAL LOW (ref >60)
Glucose, Bld: 122 mg/dL — ABNORMAL HIGH (ref 70–99)
Potassium: 3.2 mmol/L — ABNORMAL LOW (ref 3.5–5.1)
Sodium: 143 mmol/L (ref 135–145)

## 2018-12-27 LAB — CBC
HCT: 42.5 % (ref 36.0–46.0)
Hemoglobin: 13.7 g/dL (ref 12.0–15.0)
MCH: 30.5 pg (ref 26.0–34.0)
MCHC: 32.2 g/dL (ref 30.0–36.0)
MCV: 94.7 fL (ref 80.0–100.0)
Platelets: 220 10*3/uL (ref 150–400)
RBC: 4.49 MIL/uL (ref 3.87–5.11)
RDW: 12.4 % (ref 11.5–15.5)
WBC: 10.3 10*3/uL (ref 4.0–10.5)
nRBC: 0 % (ref 0.0–0.2)

## 2018-12-27 LAB — TSH: TSH: 5.185 u[IU]/mL — ABNORMAL HIGH (ref 0.350–4.500)

## 2018-12-27 LAB — LIPID PANEL
Cholesterol: 209 mg/dL — ABNORMAL HIGH (ref 0–200)
HDL: 57 mg/dL (ref >40)
LDL Cholesterol: 134 mg/dL — ABNORMAL HIGH (ref 0–99)
Total CHOL/HDL Ratio: 3.7 RATIO
Triglycerides: 89 mg/dL (ref <150)
VLDL: 18 mg/dL (ref 0–40)

## 2018-12-27 LAB — ECHOCARDIOGRAM COMPLETE
Height: 67 in
Weight: 2163.2 oz

## 2018-12-27 LAB — VITAMIN B12: Vitamin B-12: 268 pg/mL (ref 180–914)

## 2018-12-27 LAB — AMMONIA: Ammonia: 12 umol/L (ref 9–35)

## 2018-12-27 LAB — HIV ANTIBODY (ROUTINE TESTING W REFLEX): HIV Screen 4th Generation wRfx: NONREACTIVE

## 2018-12-27 LAB — RPR: RPR Ser Ql: NONREACTIVE

## 2018-12-27 MED ORDER — SODIUM CHLORIDE 0.9% FLUSH: 3.0000 mL | Freq: Two times a day (BID) | INTRAVENOUS | Status: DC

## 2018-12-27 MED ORDER — SODIUM CHLORIDE 0.9% FLUSH
3.0000 mL | INTRAVENOUS | Status: DC | PRN
  Administered 2018-12-27: 3 mL via INTRAVENOUS
  Filled 2018-12-27: qty 3

## 2018-12-27 MED ORDER — CALCITRIOL 0.25 MCG PO CAPS: 0.2500 ug | ORAL_CAPSULE | ORAL | Status: DC

## 2018-12-27 MED ORDER — ONDANSETRON HCL 4 MG PO TABS: 4.0000 mg | ORAL_TABLET | Freq: Four times a day (QID) | ORAL | Status: DC | PRN

## 2018-12-27 MED ORDER — POTASSIUM CHLORIDE CRYS ER 20 MEQ PO TBCR
40.0000 meq | EXTENDED_RELEASE_TABLET | ORAL | Status: AC
  Administered 2018-12-27 (×2): 40 meq via ORAL
  Filled 2018-12-27 (×2): qty 2

## 2018-12-27 MED ORDER — LABETALOL HCL 5 MG/ML IV SOLN
10.0000 mg | Freq: Once | INTRAVENOUS | Status: AC
  Administered 2018-12-27: 10 mg via INTRAVENOUS
  Filled 2018-12-27: qty 4

## 2018-12-27 MED ORDER — HYDRALAZINE HCL 20 MG/ML IJ SOLN: 10.0000 mg | Freq: Four times a day (QID) | INTRAMUSCULAR | Status: DC | PRN

## 2018-12-27 MED ORDER — DONEPEZIL HCL 5 MG PO TABS
5.0000 mg | ORAL_TABLET | Freq: Every day | ORAL | Status: DC
  Administered 2018-12-27: 5 mg via ORAL
  Filled 2018-12-27: qty 1

## 2018-12-27 MED ORDER — ACETAMINOPHEN 325 MG PO TABS: 650.0000 mg | ORAL_TABLET | Freq: Four times a day (QID) | ORAL | Status: DC | PRN

## 2018-12-27 MED ORDER — TRIAMTERENE-HCTZ 75-50 MG PO TABS
1.0000 | ORAL_TABLET | Freq: Every day | ORAL | Status: DC
  Administered 2018-12-27: 1 via ORAL
  Filled 2018-12-27: qty 1

## 2018-12-27 MED ORDER — APIXABAN 5 MG PO TABS
5.0000 mg | ORAL_TABLET | Freq: Two times a day (BID) | ORAL | Status: DC
  Administered 2018-12-27 (×2): 5 mg via ORAL
  Filled 2018-12-27 (×2): qty 1

## 2018-12-27 MED ORDER — SODIUM CHLORIDE 0.9 % IV SOLN: 250.0000 mL | INTRAVENOUS | Status: DC | PRN

## 2018-12-27 MED ORDER — SODIUM CHLORIDE 0.9% FLUSH
3.0000 mL | Freq: Two times a day (BID) | INTRAVENOUS | Status: DC
  Administered 2018-12-27 (×2): 3 mL via INTRAVENOUS

## 2018-12-27 MED ORDER — ACETAMINOPHEN 650 MG RE SUPP: 650.0000 mg | Freq: Four times a day (QID) | RECTAL | Status: DC | PRN

## 2018-12-27 MED ORDER — LEVOTHYROXINE SODIUM 75 MCG PO TABS
75.0000 ug | ORAL_TABLET | Freq: Every day | ORAL | Status: DC
  Administered 2018-12-27: 75 ug via ORAL
  Filled 2018-12-27: qty 1

## 2018-12-27 MED ORDER — ONDANSETRON HCL 4 MG/2ML IJ SOLN: 4.0000 mg | Freq: Four times a day (QID) | INTRAMUSCULAR | Status: DC | PRN

## 2018-12-27 MED ORDER — SENNOSIDES-DOCUSATE SODIUM 8.6-50 MG PO TABS: 1.0000 | ORAL_TABLET | Freq: Every evening | ORAL | Status: DC | PRN

## 2018-12-27 NOTE — Progress Notes (Signed)
All DC instructions were gone over with pt. IV was removed and WDL. Pt. Verbalized an understanding of all instructions and had no questions at the time.

## 2018-12-27 NOTE — Care Management Obs Status (Signed)
[place] NOTIFICATION   Patient Details  Name: Amanda [name] MRN: [id number] Date of Birth: Nov 01, 1940   Medicare Observation Status Notification Given:  Yes    Joaquin Courts, RN 12/27/2018, 4:39 PM

## 2018-12-27 NOTE — ED Notes (Signed)
ED TO INPATIENT HANDOFF REPORT  Name/Age/Gender Amanda [name] 78 y.o. female  Code Status Code Status History    Date Active Date Inactive Code Status Order ID Comments User Context   04/26/2017 1742 05/04/2017 1401 Full Code [id number]  [name] Inpatient   04/26/2017 1742 04/26/2017 1742 Full Code [id number]  [name], PA-C Inpatient   04/16/2017 1500 04/26/2017 1737 Full Code [id number]  [name], MD Inpatient   03/26/2017 1512 03/26/2017 2236 Full Code [id number]  [name], MD Inpatient   04/02/2015 0004 04/04/2015 1455 Full Code [id number]  [name], MD Inpatient   01/07/2015 1442 01/08/2015 1817 Full Code [id number]  [name], MD Inpatient   01/06/2015 2303 01/07/2015 1442 Full Code [id number]  [name], MD Inpatient   Advance Care Planning Activity      Home/SNF/Other Home  Chief Complaint confused  Level of Care/Admitting Diagnosis ED Disposition    ED Disposition Condition [place]: [place] [[id number]]  Level of Care: Telemetry [5]  Admit to tele based on following criteria: Other see comments  Comments: acute neurologic condition  Covid Evaluation: Confirmed COVID Negative  Diagnosis: Acute encephalopathy [732202]  Admitting Physician: Vianne Bulls [[id number]]  Attending Physician: Vianne Bulls [[id number]]  PT Class (Do Not Modify): Observation [104]  PT Acc Code (Do Not Modify): Observation [10022]       Medical History Past Medical History:  Diagnosis Date  . Allergic rhinitis   . Anemia   . Asymptomatic LV dysfunction    EF 45-50% echo 07/2017  . CKD (chronic kidney disease), stage III ([place])    stage III  . DJD (degenerative joint disease)   . Glomerulonephritis    Dr Jimmy Footman  . Hyperlipidemia   . Hyperparathyroidism ([place])   . Hypertension   . Hypothyroidism   . IBS (irritable bowel syndrome)   . Interstitial cystitis   . Mitral regurgitation    . Mobitz II    a. s/p STJ dual chamber PPM   . MVP (mitral valve prolapse)    moderate posterior MVP with moderate MR and grade II diasotlic dysfunction  . PAC (premature atrial contraction)   . PAF (paroxysmal atrial fibrillation) ([place])     note on pacer check. CHADS2VASC score is 4 now on Eliquis.  [name] PVC's (premature ventricular contractions)   . S/P minimally invasive maze operation for atrial fibrillation 04/16/2017   Complete bilateral atrial lesion set using cryothermy and bipolar radiofrequency ablation with clipping of LA appendage via right mini thoracotomy approach  . S/P minimally invasive mitral valve repair 04/16/2017   Complex valvuloplasty including triangular resection of posterior leaflet, artificial Gore-tex neochords x6 and Sorin Memo 3D ring annuloplasty ([id number], size 32, serial # [id number])  . S/P placement of cardiac pacemaker   . Small vessel disease, cerebrovascular   . Stroke [facility])    a. old stroke seen on imaging.    Allergies Allergies  Allergen Reactions  . Pepcid [Famotidine] Anaphylaxis, Swelling and Other (See Comments)    Causes "Throat Swelling"   . Allopurinol Other (See Comments)    Caused headaches  . Amlodipine Other (See Comments)    Results in PEDAL EDEMA   . Atorvastatin Other (See Comments)    Causes her leg muscles to ache  . Contrast Media [Iodinated Diagnostic Agents] Hives    IVP dye per patient  . Cephalexin Itching and Rash  . Ciprofloxacin Itching and  Rash  . Colchicine Rash  . Erythromycin Itching and Rash  . Prilosec [Omeprazole] Nausea Only  . Sulfonamide Derivatives Itching and Rash  . Tetracycline Itching and Rash    IV Location/Drains/Wounds Patient Lines/Drains/Airways Status   Active Line/Drains/Airways    Name:   Placement date:   Placement time:   Site:   Days:   Peripheral IV 12/26/18 Left Antecubital   12/26/18    2200    Antecubital   1   Chest Tube 1 Right Pleural   05/23/17    1559    Pleural   583   External  Urinary Catheter   04/22/17    2145    -   614   Incision (Closed) 04/16/17 Groin Right   04/16/17    0807     620   Incision (Closed) 04/16/17 Chest Other (Comment)   04/16/17    0807     620   Incision (Closed) 04/16/17 Chest Right   04/16/17    2154     620   Incision (Closed) 05/23/17 Chest Right   05/23/17    1554     583          Labs/Imaging Results for orders placed or performed during the hospital encounter of 12/26/18 (from the past 48 hour(s))  Comprehensive metabolic panel     Status: Abnormal   Collection Time: 12/26/18  9:09 PM  Result Value Ref Range   Sodium 142 135 - 145 mmol/L   Potassium 2.8 (L) 3.5 - 5.1 mmol/L   Chloride 101 98 - 111 mmol/L   CO2 29 22 - 32 mmol/L   Glucose, Bld 116 (H) 70 - 99 mg/dL   BUN 23 8 - 23 mg/dL   Creatinine, Ser 5.02 (H) 0.44 - 1.00 mg/dL   Calcium 77.4 8.9 - 12.8 mg/dL   Total Protein 7.6 6.5 - 8.1 g/dL   Albumin 4.3 3.5 - 5.0 g/dL   AST 17 15 - 41 U/L   ALT 11 0 - 44 U/L   Alkaline Phosphatase 61 38 - 126 U/L   Total Bilirubin 0.5 0.3 - 1.2 mg/dL   GFR calc non Af Amer 37 (L) >60 mL/min   GFR calc Af Amer 43 (L) >60 mL/min   Anion gap 12 5 - 15    Comment: Performed at [facility], [address] [place], [place] [address]  Urinalysis, Routine w reflex microscopic     Status: Abnormal   Collection Time: 12/26/18  9:09 PM  Result Value Ref Range   Color, Urine YELLOW YELLOW   APPearance CLEAR CLEAR   Specific Gravity, Urine 1.011 1.005 - 1.030   pH 5.0 5.0 - 8.0   Glucose, UA NEGATIVE NEGATIVE mg/dL   Hgb urine dipstick MODERATE (A) NEGATIVE   Bilirubin Urine NEGATIVE NEGATIVE   Ketones, ur NEGATIVE NEGATIVE mg/dL   Protein, ur NEGATIVE NEGATIVE mg/dL   Nitrite NEGATIVE NEGATIVE   Leukocytes,Ua NEGATIVE NEGATIVE   RBC / HPF 6-10 0 - 5 RBC/hpf   WBC, UA 0-5 0 - 5 WBC/hpf   Bacteria, UA NONE SEEN NONE SEEN   Squamous Epithelial / LPF 0-5 0 - 5   Mucus PRESENT     Comment: Performed at [facility], [address] [place], [place] [address]  CBC with Differential     Status: None   Collection Time: 12/26/18  9:09 PM  Result Value Ref Range   WBC 10.3 4.0 -  10.5 K/uL   RBC 4.55 3.87 - 5.11 MIL/uL   Hemoglobin 14.3 12.0 - 15.0 g/dL   HCT 16.143.6 09.636.0 - 04.546.0 %   MCV 95.8 80.0 - 100.0 fL   MCH 31.4 26.0 - 34.0 pg   MCHC 32.8 30.0 - 36.0 g/dL   RDW 40.912.5 81.111.5 - 91.415.5 %   Platelets 248 150 - 400 K/uL   nRBC 0.0 0.0 - 0.2 %   Neutrophils Relative % 57 %   Neutro Abs 5.8 1.7 - 7.7 K/uL   Lymphocytes Relative 32 %   Lymphs Abs 3.3 0.7 - 4.0 K/uL   Monocytes Relative 7 %   Monocytes Absolute 0.8 0.1 - 1.0 K/uL   Eosinophils Relative 3 %   Eosinophils Absolute 0.3 0.0 - 0.5 K/uL   Basophils Relative 1 %   Basophils Absolute 0.1 0.0 - 0.1 K/uL   Immature Granulocytes 0 %   Abs Immature Granulocytes 0.04 0.00 - 0.07 K/uL    Comment: Performed at [facility], [address] [place], [place] [address]  Lactic acid, plasma     Status: None   Collection Time: 12/26/18  9:09 PM  Result Value Ref Range   Lactic Acid, Venous 1.2 0.5 - 1.9 mmol/L    Comment: Performed at [facility], [address] [place], [place] [address]  Troponin I - ONCE - STAT     Status: None   Collection Time: 12/26/18  9:09 PM  Result Value Ref Range   Troponin I <0.03 <0.03 ng/mL    Comment: Performed at [facility], [address] [place], [place] [address]  SARS Coronavirus 2 (CEPHEID - Performed in [facility] hospital lab), Hosp Order     Status: None   Collection Time: 12/26/18  9:09 PM   Specimen: Nasopharyngeal  Result Value Ref Range   SARS Coronavirus 2 NEGATIVE NEGATIVE    Comment: (NOTE) If result is NEGATIVE SARS-CoV-2 target nucleic acids are NOT DETECTED. The SARS-CoV-2 RNA is generally detectable in upper and lower  respiratory specimens during the acute phase of infection. The lowest  concentration of SARS-CoV-2  viral copies this assay can detect is 250  copies / mL. A negative result does not preclude SARS-CoV-2 infection  and should not be used as the sole basis for treatment or other  patient management decisions.  A negative result may occur with  improper specimen collection / handling, submission of specimen other  than nasopharyngeal swab, presence of viral mutation(s) within the  areas targeted by this assay, and inadequate number of viral copies  (<250 copies / mL). A negative result must be combined with clinical  observations, patient history, and epidemiological information. If result is POSITIVE SARS-CoV-2 target nucleic acids are DETECTED. The SARS-CoV-2 RNA is generally detectable in upper and lower  respiratory specimens dur ing the acute phase of infection.  Positive  results are indicative of active infection with SARS-CoV-2.  Clinical  correlation with patient history and other diagnostic information is  necessary to determine patient infection status.  Positive results do  not rule out bacterial infection or co-infection with other viruses. If result is PRESUMPTIVE POSTIVE SARS-CoV-2 nucleic acids MAY BE PRESENT.   A presumptive positive result was obtained on the submitted specimen  and confirmed on repeat testing.  While 2019 novel coronavirus  (SARS-CoV-2) nucleic acids may be present in the submitted sample  additional confirmatory testing may be necessary for epidemiological  and / or clinical  management purposes  to differentiate between  SARS-CoV-2 and other Sarbecovirus currently known to infect humans.  If clinically indicated additional testing with an alternate test  methodology [phone] is advised. The SARS-CoV-2 RNA is generally  detectable in upper and lower respiratory sp ecimens during the acute  phase of infection. The expected result is Negative. Fact Sheet for Patients:  BoilerBrush.com.cyhttps://www.fda.gov/media/136312/download Fact Sheet for Healthcare  Providers: https://pope.com/https://www.fda.gov/media/136313/download This test is not yet approved or cleared by the [place] FDA and has been authorized for detection and/or diagnosis of SARS-CoV-2 by FDA under an Emergency Use Authorization (EUA).  This EUA will remain in effect (meaning this test can be used) for the duration of the COVID-19 declaration under Section 564(b)(1) of the Act, 21 U.S.C. section 360bbb-3(b)(1), unless the authorization is terminated or revoked sooner. Performed at [facility], [address] [place], [place] [address]   Magnesium     Status: None   Collection Time: 12/26/18  9:19 PM  Result Value Ref Range   Magnesium 2.1 1.7 - 2.4 mg/dL    Comment: Performed at [facility], [address] [place], [place] [address]   Ct Head Wo Contrast  Result Date: 12/26/2018 CLINICAL DATA:  Altered level of consciousness (LOC), unexplained. Confusion. EXAM: CT HEAD WITHOUT CONTRAST TECHNIQUE: Contiguous axial images were obtained from the base of the skull through the vertex without intravenous contrast. COMPARISON:  Head CT 04/19/2017 FINDINGS: Brain: No evidence of acute infarction, hemorrhage, hydrocephalus, extra-axial collection or mass lesion/mass effect. Generalized atrophy and moderate to advanced chronic small vessel ischemia, similar to prior exam. Remote right parietal infarct with encephalomalacia, also unchanged. Remote lacunar infarcts in the right basal ganglia. Vascular: Atherosclerosis of skullbase vasculature without hyperdense vessel or abnormal calcification. Skull: No fracture or focal lesion. Sinuses/Orbits: Paranasal sinuses and mastoid air cells are clear. The visualized orbits are unremarkable. Bilateral cataract resection. Other: None. IMPRESSION: 1. No acute intracranial abnormality. 2. Stable atrophy. Stable chronic small vessel ischemia and remote parietal infarct. Electronically Signed   By: Narda RutherfordMelanie  Sanford M.D.   On:  12/26/2018 22:19   Dg Chest Port 1 View  Result Date: 12/26/2018 CLINICAL DATA:  Altered mental status EXAM: PORTABLE CHEST 1 VIEW COMPARISON:  08/19/2017 FINDINGS: Left-sided pacing device as before. Valve prosthesis and vascular clip. Small right-sided pleural effusion. Mild cardiomegaly with aortic atherosclerosis. No pneumothorax. IMPRESSION: 1. Cardiomegaly. 2. Small right-sided pleural effusion with hazy atelectasis at the right base. Electronically Signed   By: Jasmine PangKim  Fujinaga M.D.   On: 12/26/2018 21:14    Pending Labs Unresulted Labs (From admission, onward)    Start     Ordered   12/26/18 2046  Lactic acid, plasma  Now then every 2 hours,   STAT     12/26/18 2048   12/26/18 2045  Urine culture  ONCE - STAT,   STAT     12/26/18 2048   Signed and Held  Basic metabolic panel  Tomorrow morning,   R     Signed and Held   Signed and Held  CBC  Tomorrow morning,   R     Signed and Held   Signed and Held  TSH  Once,   R     Signed and Held   Signed and Held  Ammonia  Once,   R     Signed and Held   Signed and Held  Vitamin B12  Once,   R     Signed and Held  Signed and Held  Folate RBC  Once,   R     Signed and Held   Signed and Held  RPR  Once,   R     Signed and Held   Signed and Held  HIV Antibody (routine testing w rflx)  Once,   R     Signed and Held          Vitals/Pain Today's Vitals   12/26/18 2100 12/26/18 2130 12/26/18 2230 12/26/18 2300  BP: (!) 173/107 (!) 175/91 (!) 148/125 (!) 175/90  Pulse: 95 91 95 93  Resp: 17 17 17  (!) 22  Temp:      TempSrc:      SpO2: 100% 95% 95% 94%  PainSc:        Isolation Precautions No active isolations  Medications Medications  potassium chloride SA (K-DUR) CR tablet 40 mEq (40 mEq Oral Given 12/26/18 2237)  potassium chloride 10 mEq in 100 mL IVPB (0 mEq Intravenous Stopped 12/26/18 2354)  sodium chloride 0.9 % bolus 500 mL (0 mLs Intravenous Stopped 12/26/18 2354)  sodium chloride 0.9 % bolus 500 mL (0 mLs Intravenous  Stopped 12/27/18 0052)    Mobility walks

## 2018-12-27 NOTE — H&P (Addendum)
History and Physical    Amanda [name] ZOX:[id number] DOB: 05/28/1941 DOA: 12/26/2018  PCP: Gweneth DimitriMcNeill, Wendy, MD   Patient coming from: Home   Chief Complaint: Confusion, speech difficulty   HPI: Amanda ShoalsLoretta A Gasbarro is a 78 y.o. female with medical history significant for paroxysmal atrial fibrillation on Eliquis, chronic kidney disease stage III, history of CVA, hypertension, hypothyroidism, and mild cognitive impairment, now presenting to the emergency department for evaluation of confusion and speech difficulty.  At baseline, per report of family, patient has some very mild confusion but continues to live alone and drive herself.  She was seen by family yesterday afternoon and appeared to be in her usual state, but had remarked that she felt tired. She reports some mild lightheadedness first thing in the morning for a couple weeks, as well as a mild general malaise that she attributed to loneliness as her husband passing away a few months ago and she has been mainly at home due to the current pandemic.  She reports some generalized weakness but denies any focal numbness or weakness, headache, or change in vision or hearing. Family noted her tonight at dinner to be much more confused than they had ever seen her and seemed to be having difficulty getting words out.  This has waxed and waned since onset a few hours ago. The patient denied any focal numbness or weakness, but did report some generalized weakness.  ED Course: Upon arrival to the ED, patient is found to be afebrile, saturating adequately on room air, and hypertensive to 175/90.  EKG features a paced rhythm and noncontrast head CT is negative for acute intracranial abnormality.  Chest x-ray is notable for cardiomegaly with small right pleural effusion and hazy atelectasis at the right base.  Chemistry panel features a potassium of 2.8 and creatinine 1.36, similar to priors.  CBC is unremarkable.  Urinalysis with microscopic hematuria.   COVID-19 screening test is negative.  Potassium was replaced in the emergency department and ED physician discussed the case with neurology who felt this sounded more like a metabolic encephalopathy than acute CVA, and the hospitalist were asked to admit for ongoing evaluation and management.  Review of Systems:  All other systems reviewed and apart from HPI, are negative.  Past Medical History:  Diagnosis Date  . Allergic rhinitis   . Anemia   . Asymptomatic LV dysfunction    EF 45-50% echo 07/2017  . CKD (chronic kidney disease), stage III (HCC)    stage III  . DJD (degenerative joint disease)   . Glomerulonephritis    Dr Darrick Pennaeterding  . Hyperlipidemia   . Hyperparathyroidism (HCC)   . Hypertension   . Hypothyroidism   . IBS (irritable bowel syndrome)   . Interstitial cystitis   . Mitral regurgitation   . Mobitz II    a. s/p STJ dual chamber PPM   . MVP (mitral valve prolapse)    moderate posterior MVP with moderate MR and grade II diasotlic dysfunction  . PAC (premature atrial contraction)   . PAF (paroxysmal atrial fibrillation) (HCC)     note on pacer check. CHADS2VASC score is 4 now on Eliquis.  [name]. PVC's (premature ventricular contractions)   . S/P minimally invasive maze operation for atrial fibrillation 04/16/2017   Complete bilateral atrial lesion set using cryothermy and bipolar radiofrequency ablation with clipping of LA appendage via right mini thoracotomy approach  . S/P minimally invasive mitral valve repair 04/16/2017   Complex valvuloplasty including triangular resection of posterior leaflet,  artificial Gore-tex neochords x6 and Sorin Memo 3D ring annuloplasty ([id number], size 32, serial # [id number])  . S/P placement of cardiac pacemaker   . Small vessel disease, cerebrovascular   . Stroke [facility](HCC)    a. old stroke seen on imaging.    Past Surgical History:  Procedure Laterality Date  . ABDOMINAL HYSTERECTOMY    . BREAST BIOPSY Right   . CHEST TUBE INSERTION Right  05/23/2017   Procedure: INSERTION PLEURAL DRAINAGE CATHETER;  Surgeon: Purcell Nailswen, Clarence H, MD;  [facility];  Service: Thoracic;  Laterality: Right;  possible pleurex catheter  . CLIPPING OF ATRIAL APPENDAGE  04/16/2017   Procedure: CLIPPING OF ATRIAL APPENDAGE using AtriCure Pro2 clip 45;  Surgeon: Purcell Nailswen, Clarence H, MD;  Location: [facility] OR;  Service: Open Heart Surgery;;  . CYSTOSTOMY W/ BLADDER BIOPSY    . EP IMPLANTABLE DEVICE N/A 01/07/2015   STJ dual chamber pacemaker implanted by Dr Ladona Ridgelaylor for 2:1 heart block  . MINIMALLY INVASIVE MAZE PROCEDURE N/A 04/16/2017   Procedure: MINIMALLY INVASIVE MAZE PROCEDURE;  Surgeon: Purcell Nailswen, Clarence H, MD;  [facility];  Service: Open Heart Surgery;  Laterality: N/A;  . MITRAL VALVE REPAIR Right 04/16/2017   Procedure: MINIMALLY INVASIVE MITRAL VALVE REPAIR (MVR);  Surgeon: Purcell Nailswen, Clarence H, MD;  [facility];  Service: Open Heart Surgery;  Laterality: Right;  . MITRAL VALVE REPAIR Right 04/16/2017   Procedure: RE-EXPLORATION RIGHT THORACOTOMY FOR BLEEDING;  Surgeon: Purcell Nailswen, Clarence H, MD;  [facility];  Service: Open Heart Surgery;  Laterality: Right;  . PERIPHERAL VASCULAR CATHETERIZATION N/A 02/03/2015   Procedure: Upper Extremity Venography;  Surgeon: Duke SalviaSteven C Klein, MD;  [facility];  Service: Cardiovascular;  Laterality: N/A;  . REMOVAL OF PLEURAL DRAINAGE CATHETER Right 09/03/2017   Procedure: REMOVAL OF PLEURAL DRAINAGE CATHETER;  Surgeon: Purcell Nailswen, Clarence H, MD;  [facility];  Service: Thoracic;  Laterality: Right;  . RIGHT/LEFT HEART CATH AND CORONARY ANGIOGRAPHY N/A 03/26/2017   Procedure: RIGHT/LEFT HEART CATH AND CORONARY ANGIOGRAPHY;  Surgeon: Marykay LexHarding, David W, MD;  [facility];  Service: Cardiovascular;  Laterality: N/A;  . TEE WITHOUT CARDIOVERSION N/A 03/18/2017   Procedure: TRANSESOPHAGEAL ECHOCARDIOGRAM (TEE);  Surgeon: Chilton Siandolph, Tiffany, MD;  [facility];  Service: Cardiovascular;  Laterality:  N/A;  . TEE WITHOUT CARDIOVERSION N/A 04/16/2017   Procedure: TRANSESOPHAGEAL ECHOCARDIOGRAM (TEE);  Surgeon: Purcell Nailswen, Clarence H, MD;  [facility];  Service: Open Heart Surgery;  Laterality: N/A;  . TONSILLECTOMY       reports that she has quit smoking. She has never used smokeless tobacco. She reports that she does not drink alcohol or use drugs.  Allergies  Allergen Reactions  . Pepcid [Famotidine] Anaphylaxis, Swelling and Other (See Comments)    Causes "Throat Swelling"   . Allopurinol Other (See Comments)    Caused headaches  . Amlodipine Other (See Comments)    Results in PEDAL EDEMA   . Atorvastatin Other (See Comments)    Causes her leg muscles to ache  . Contrast Media [Iodinated Diagnostic Agents] Hives    IVP dye per patient  . Cephalexin Itching and Rash  . Ciprofloxacin Itching and Rash  . Colchicine Rash  . Erythromycin Itching and Rash  . Prilosec [Omeprazole] Nausea Only  . Sulfonamide Derivatives Itching and Rash  . Tetracycline Itching and Rash    Family History  Problem Relation Age of Onset  . Heart disease Mother   . Stroke Mother   .  Hypertension Mother   . Heart disease Father   . Heart attack Father   . Ovarian cancer Sister   . Heart disease Brother   . Rheum arthritis Sister   . Melanoma Brother   . Brain cancer Brother   . Heart attack Brother   . Hypertension Brother   . Hypertension Sister   . Colon cancer Neg Hx      Prior to Admission medications   Medication Sig Start Date End Date Taking? Authorizing Provider  acetaminophen (TYLENOL) 500 MG tablet Take 500 mg by mouth every 6 (six) hours as needed for moderate pain.    Yes [provider]  amiloride-hydrochlorothiazide (MODURETIC) 5-50 MG tablet Take 0.5 tablets by mouth daily.   Yes [provider]  apixaban (ELIQUIS) 5 MG TABS tablet Take 1 tablet (5 mg total) by mouth 2 (two) times daily. 11/11/18  Yes Turner, Eber Hong, MD  calcitRIOL (ROCALTROL) 0.25 MCG  capsule Take 0.25 mcg by mouth every other day. In the morning    Yes [provider]  conjugated estrogens (PREMARIN) vaginal cream Place 1 Applicatorful vaginally at bedtime.    Yes [provider]  donepezil (ARICEPT) 5 MG tablet Take 5 mg by mouth daily. 09/23/18  Yes [provider]  fexofenadine (ALLEGRA) 180 MG tablet Take 180 mg by mouth daily.   Yes [provider]  fluticasone (FLONASE) 50 MCG/ACT nasal spray Place 2 sprays into both nostrils daily.    Yes [provider]  levothyroxine (SYNTHROID, LEVOTHROID) 75 MCG tablet Take 75 mcg by mouth daily.   Yes [provider]  vitamin B-12 (CYANOCOBALAMIN) 1000 MCG tablet Take 1,000 mcg by mouth once a week.   Yes [provider]    Physical Exam: Vitals:   12/26/18 2100 12/26/18 2130 12/26/18 2230 12/26/18 2300  BP: (!) 173/107 (!) 175/91 (!) 148/125 (!) 175/90  Pulse: 95 91 95 93  Resp: 17 17 17  (!) 22  Temp:      TempSrc:      SpO2: 100% 95% 95% 94%    Constitutional: NAD, calm  Eyes: PERTLA, lids and conjunctivae normal ENMT: Mucous membranes are moist. Posterior pharynx clear of any exudate or lesions.   Neck: normal, supple, no masses, no thyromegaly Respiratory: no wheezing, no crackles. Normal respiratory effort. No accessory muscle use.  Cardiovascular: S1 & S2 heard, regular rate and rhythm. No extremity edema.   Abdomen: No distension, no tenderness, soft. Bowel sounds active.  Musculoskeletal: no clubbing / cyanosis. No joint deformity upper and lower extremities.    Skin: no significant rashes, lesions, ulcers. Warm, dry, well-perfused. Neurologic: CN 2-12 grossly intact. Sensation intact. Strength 5/5 in all 4 limbs.  Psychiatric: Alert and oriented to person, place, and situation. Waxing/waning confusion in ED. Pleasant, cooperative.    Labs on Admission: I have personally reviewed following labs and imaging studies  CBC: Recent Labs  Lab  12/26/18 2109  WBC 10.3  NEUTROABS 5.8  HGB 14.3  HCT 43.6  MCV 95.8  PLT 503   Basic Metabolic Panel: Recent Labs  Lab 12/26/18 2109 12/26/18 2119  NA 142  --   K 2.8*  --   CL 101  --   CO2 29  --   GLUCOSE 116*  --   BUN 23  --   CREATININE 1.36*  --   CALCIUM 10.2  --   MG  --  2.1   GFR: CrCl cannot be calculated (Unknown ideal weight.). Liver  Function Tests: Recent Labs  Lab 12/26/18 2109  AST 17  ALT 11  ALKPHOS 61  BILITOT 0.5  PROT 7.6  ALBUMIN 4.3   No results for input(s): LIPASE, AMYLASE in the last 168 hours. No results for input(s): AMMONIA in the last 168 hours. Coagulation Profile: No results for input(s): INR, PROTIME in the last 168 hours. Cardiac Enzymes: Recent Labs  Lab 12/26/18 2109  TROPONINI <0.03   BNP (last 3 results) No results for input(s): PROBNP in the last 8760 hours. HbA1C: No results for input(s): HGBA1C in the last 72 hours. CBG: No results for input(s): GLUCAP in the last 168 hours. Lipid Profile: No results for input(s): CHOL, HDL, LDLCALC, TRIG, CHOLHDL, LDLDIRECT in the last 72 hours. Thyroid Function Tests: No results for input(s): TSH, T4TOTAL, FREET4, T3FREE, THYROIDAB in the last 72 hours. Anemia Panel: No results for input(s): VITAMINB12, FOLATE, FERRITIN, TIBC, IRON, RETICCTPCT in the last 72 hours. Urine analysis:    Component Value Date/Time   COLORURINE YELLOW 12/26/2018 2109   APPEARANCEUR CLEAR 12/26/2018 2109   LABSPEC 1.011 12/26/2018 2109   PHURINE 5.0 12/26/2018 2109   GLUCOSEU NEGATIVE 12/26/2018 2109   HGBUR MODERATE (A) 12/26/2018 2109   BILIRUBINUR NEGATIVE 12/26/2018 2109   KETONESUR NEGATIVE 12/26/2018 2109   PROTEINUR NEGATIVE 12/26/2018 2109   UROBILINOGEN 1.0 04/01/2015 1840   NITRITE NEGATIVE 12/26/2018 2109   LEUKOCYTESUR NEGATIVE 12/26/2018 2109   Sepsis Labs: @LABRCNTIP (procalcitonin:4,lacticidven:4) ) Recent Results (from the past 240 hour(s))  SARS Coronavirus 2 (CEPHEID  - Performed in [facility] hospital lab), Hosp Order     Status: None   Collection Time: 12/26/18  9:09 PM   Specimen: Nasopharyngeal  Result Value Ref Range Status   SARS Coronavirus 2 NEGATIVE NEGATIVE Final    Comment: (NOTE) If result is NEGATIVE SARS-CoV-2 target nucleic acids are NOT DETECTED. The SARS-CoV-2 RNA is generally detectable in upper and lower  respiratory specimens during the acute phase of infection. The lowest  concentration of SARS-CoV-2 viral copies this assay can detect is 250  copies / mL. A negative result does not preclude SARS-CoV-2 infection  and should not be used as the sole basis for treatment or other  patient management decisions.  A negative result may occur with  improper specimen collection / handling, submission of specimen other  than nasopharyngeal swab, presence of viral mutation(s) within the  areas targeted by this assay, and inadequate number of viral copies  (<250 copies / mL). A negative result must be combined with clinical  observations, patient history, and epidemiological information. If result is POSITIVE SARS-CoV-2 target nucleic acids are DETECTED. The SARS-CoV-2 RNA is generally detectable in upper and lower  respiratory specimens dur ing the acute phase of infection.  Positive  results are indicative of active infection with SARS-CoV-2.  Clinical  correlation with patient history and other diagnostic information is  necessary to determine patient infection status.  Positive results do  not rule out bacterial infection or co-infection with other viruses. If result is PRESUMPTIVE POSTIVE SARS-CoV-2 nucleic acids MAY BE PRESENT.   A presumptive positive result was obtained on the submitted specimen  and confirmed on repeat testing.  While 2019 novel coronavirus  (SARS-CoV-2) nucleic acids may be present in the submitted sample  additional confirmatory testing may be necessary for epidemiological  and / or clinical management  purposes  to differentiate between  SARS-CoV-2 and other Sarbecovirus currently known to infect humans.  If clinically indicated additional testing with an  alternate test  methodology [phone]) is advised. The SARS-CoV-2 RNA is generally  detectable in upper and lower respiratory sp ecimens during the acute  phase of infection. The expected result is Negative. Fact Sheet for Patients:  BoilerBrush.com.cyhttps://www.fda.gov/media/136312/download Fact Sheet for Healthcare Providers: https://pope.com/https://www.fda.gov/media/136313/download This test is not yet approved or cleared by the [place] FDA and has been authorized for detection and/or diagnosis of SARS-CoV-2 by FDA under an Emergency Use Authorization (EUA).  This EUA will remain in effect (meaning this test can be used) for the duration of the COVID-19 declaration under Section 564(b)(1) of the Act, 21 U.S.C. section 360bbb-3(b)(1), unless the authorization is terminated or revoked sooner. Performed at [facility], [address] [place], [place] [address]      Radiological Exams on Admission: Ct Head Wo Contrast  Result Date: 12/26/2018 CLINICAL DATA:  Altered level of consciousness (LOC), unexplained. Confusion. EXAM: CT HEAD WITHOUT CONTRAST TECHNIQUE: Contiguous axial images were obtained from the base of the skull through the vertex without intravenous contrast. COMPARISON:  Head CT 04/19/2017 FINDINGS: Brain: No evidence of acute infarction, hemorrhage, hydrocephalus, extra-axial collection or mass lesion/mass effect. Generalized atrophy and moderate to advanced chronic small vessel ischemia, similar to prior exam. Remote right parietal infarct with encephalomalacia, also unchanged. Remote lacunar infarcts in the right basal ganglia. Vascular: Atherosclerosis of skullbase vasculature without hyperdense vessel or abnormal calcification. Skull: No fracture or focal lesion. Sinuses/Orbits: Paranasal sinuses and mastoid air cells are  clear. The visualized orbits are unremarkable. Bilateral cataract resection. Other: None. IMPRESSION: 1. No acute intracranial abnormality. 2. Stable atrophy. Stable chronic small vessel ischemia and remote parietal infarct. Electronically Signed   By: Narda RutherfordMelanie  Sanford M.D.   On: 12/26/2018 22:19   Dg Chest Port 1 View  Result Date: 12/26/2018 CLINICAL DATA:  Altered mental status EXAM: PORTABLE CHEST 1 VIEW COMPARISON:  08/19/2017 FINDINGS: Left-sided pacing device as before. Valve prosthesis and vascular clip. Small right-sided pleural effusion. Mild cardiomegaly with aortic atherosclerosis. No pneumothorax. IMPRESSION: 1. Cardiomegaly. 2. Small right-sided pleural effusion with hazy atelectasis at the right base. Electronically Signed   By: Jasmine PangKim  Fujinaga M.D.   On: 12/26/2018 21:14    EKG: Independently reviewed. Paced rhythm.   Assessment/Plan   1. Acute encephalopathy  - Presents with confusion and speech difficulty after a couple weeks of mild malaise and generalized weakness  - Head CT is negative for acute findings and no focal neurologic deficit identified on exam  - Check TSH, ammonia, RPR, B12, and folate; continue neuro checks, supportive care, consider MRI brain if no other etiology identified   2. Hypokalemia  - Potassium is 2.8 in ED with mag 2.1  - Replaced by IV and oral route in ED  - Continue cardiac monitoring, repeat chemistries in am   3. Paroxysmal atrial fibrillation  - CHADS-VASc is at least 26 (age x2, CVA x2, gender, HTN) - Continue Eliquis   4. History of CVA - Presents with confusion and speech difficulty as above, no focal findings on exam, and head CT without acute findings  - She has not tolerated statins  - Continue Eliquis    5. CKD III  - SCr is 1.36 on admission, similar to priors  - Renally-dose medications    6. Hypothyroidism  - Check TSH given the presentation  - Continue Synthroid   7. Hypertension  - BP elevated in ED  - Continue  amiloride-HCTZ, avoid aggressive lowering for now as acute ischemic CVA remains on Ddx  8. Dementia  - Mild impairment per daughter, continues to live alone and drive herself  - Continue Aricept    PPE: Mask, face shield. Patient wearing mask.  DVT prophylaxis: Eliquis Code Status: Full Family Communication: Discussed with daughter by phone  Consults called: ED discussed case with neurology  Admission status: Observation    Briscoe Deutscher, MD [facility] Pager [phone]  If 7PM-7AM, please contact night-coverage [email or web address] Password [facility]  12/27/2018, 12:37 AM

## 2018-12-27 NOTE — Progress Notes (Signed)
Carotid duplex completed. Preliminary results in Chart review CV Proc. [facility], [place] 12/27/2018, 1:43 PM

## 2018-12-27 NOTE — Discharge Instructions (Signed)

## 2018-12-27 NOTE — Evaluation (Signed)
Physical Therapy Evaluation Patient Details Name: Amanda [name] MRN: [id number] DOB: [birth year] Today's Date: 12/27/2018   History of Present Illness  78 y.o. female with medical history significant for paroxysmal atrial fibrillation on Eliquis, chronic kidney disease stage III, history of CVA, hypertension, hypothyroidism, and mild cognitive impairment, now presenting to the emergency department for evaluation of confusion and speech difficulty  Clinical Impression  Pt ambulated 250' without an assistive device, no loss of balance. From PT standpoint, she is ready to DC home, no f/u PT indicated.     Follow Up Recommendations No PT follow up    Equipment Recommendations  None recommended by PT    Recommendations for Other Services       Precautions / Restrictions Precautions Precautions: None Restrictions Weight Bearing Restrictions: No      Mobility  Bed Mobility Overal bed mobility: Independent                Transfers Overall transfer level: Independent                  Ambulation/Gait Ambulation/Gait assistance: Independent Gait Distance (Feet): 250 Feet Assistive device: None Gait Pattern/deviations: WFL(Within Functional Limits) Gait velocity: WFL   General Gait Details: steady, no loss of balance  Stairs            Wheelchair Mobility    Modified Rankin (Stroke Patients Only)       Balance Overall balance assessment: No apparent balance deficits (not formally assessed)                                           Pertinent Vitals/Pain Pain Assessment: No/denies pain    Home Living Family/patient expects to be discharged to:: Private residence Living Arrangements: Alone Available Help at Discharge: Family;Available PRN/intermittently Type of Home: House Home Access: Stairs to enter Entrance Stairs-Rails: Right   Home Layout: One level        Prior Function Level of Independence: Independent         Comments: drives, daughter is local and has been getting pt's groceries during pandemic     Hand Dominance        Extremity/Trunk Assessment   Upper Extremity Assessment Upper Extremity Assessment: Overall WFL for tasks assessed    Lower Extremity Assessment Lower Extremity Assessment: Overall WFL for tasks assessed    Cervical / Trunk Assessment Cervical / Trunk Assessment: Normal  Communication   Communication: No difficulties  Cognition Arousal/Alertness: Awake/alert Behavior During Therapy: WFL for tasks assessed/performed Overall Cognitive Status: Within Functional Limits for tasks assessed                                        General Comments      Exercises     Assessment/Plan    PT Assessment Patent does not need any further PT services  PT Problem List         PT Treatment Interventions      PT Goals (Current goals can be found in the Care Plan section)  Acute Rehab PT Goals PT Goal Formulation: All assessment and education complete, DC therapy    Frequency     Barriers to discharge        Co-evaluation  AM-PAC PT "6 Clicks" Mobility  Outcome Measure Help needed turning from your back to your side while in a flat bed without using bedrails?: None Help needed moving from lying on your back to sitting on the side of a flat bed without using bedrails?: None Help needed moving to and from a bed to a chair (including a wheelchair)?: None Help needed standing up from a chair using your arms (e.g., wheelchair or bedside chair)?: None Help needed to walk in hospital room?: None Help needed climbing 3-5 steps with a railing? : None 6 Click Score: 24    End of Session Equipment Utilized During Treatment: Gait belt Activity Tolerance: Patient tolerated treatment well Patient left: in chair;with call bell/phone within reach Nurse Communication: Mobility status      Time: 1201-1216 PT Time Calculation (min)  (ACUTE ONLY): 15 min   Charges:   PT Evaluation $PT Eval Low Complexity: 1 Low          Tamala Ser PT 12/27/2018  Acute Rehabilitation Services Pager [phone] Office [phone]

## 2018-12-27 NOTE — Progress Notes (Signed)
  Echocardiogram 2D Echocardiogram has been performed.  Jannett Celestine 12/27/2018, 1:14 PM

## 2018-12-27 NOTE — Discharge Summary (Addendum)
Physician Discharge Summary  Amanda [name]:[id number] DOB: [birth year] DOA: 12/26/2018  PCP: [name], MD  Admit date: 12/26/2018 Discharge date: 12/27/2018  Time spent: 40 minutes  Recommendations for Outpatient Follow-up:  1. Follow up outpatient CBC/CMP  2. Follow up pending labs - A1c, RPR, folate RBC, urine culture 3. Of note, mild hematuria on her urine -> needs repeat outpatient  4. Follow up with neurology outpatient for her transient word finding difficulty - ?TIA 5. Follow up depression/anxiety outpatient - consider zoloft - behavioral health  6. Follow up cholesterol outpatient 7. Follow up echo with outpatient cardiology 8. Repeat TSH outpatient  Discharge Diagnoses:  Principal Problem:   Acute encephalopathy Active Problems:   Essential hypertension   Hypothyroidism   Paroxysmal atrial fibrillation (HCC)   History of CVA (cerebrovascular accident)   Stage 3 chronic kidney disease (HCC)   Hypokalemia   Dementia (HCC)   Discharge Condition: stable  Diet recommendation: heart healthy  Filed Weights   12/27/18 0134  Weight: 61.3 kg    History of present illness:  Amanda [name] is a 78 y.o. female with medical history significant for paroxysmal atrial fibrillation on Eliquis, chronic kidney disease stage III, history of CVA, hypertension, hypothyroidism, and mild cognitive impairment, now presenting to the emergency department for evaluation of confusion and speech difficulty.  At baseline, per report of family, patient has some very mild confusion but continues to live alone and drive herself.  She was seen by family yesterday afternoon and appeared to be in her usual state, but had remarked that she felt tired. She reports some mild lightheadedness first thing in the morning for a couple weeks, as well as a mild general malaise that she attributed to loneliness as her husband passing away a few months ago and she has been mainly at home due to the  current pandemic.  She reports some generalized weakness but denies any focal numbness or weakness, headache, or change in vision or hearing. Family noted her tonight at dinner to be much more confused than they had ever seen her and seemed to be having difficulty getting words out.  This has waxed and waned since onset a few hours ago. The patient denied any focal numbness or weakness, but did report some generalized weakness.  ED Course: Upon arrival to the ED, patient is found to be afebrile, saturating adequately on room air, and hypertensive to 175/90.  EKG features a paced rhythm and noncontrast head CT is negative for acute intracranial abnormality.  Chest x-ray is notable for cardiomegaly with small right pleural effusion and hazy atelectasis at the right base.  Chemistry panel features a potassium of 2.8 and creatinine 1.36, similar to priors.  CBC is unremarkable.  Urinalysis with microscopic hematuria.  COVID-19 screening test is negative.  Potassium was replaced in the emergency department and ED physician discussed the case with neurology who felt this sounded more like a metabolic encephalopathy than acute CVA, and the hospitalist were asked to admit for ongoing evaluation and management.  She was admitted for an episode of word finding difficulty and acute encephalopathy.  This was resolved when I saw the patient today on 6/20.  The case was discussed with neurology by the EDP who suspected metabolic encephalopathy.  She had TIA workup with echo and carotid dopplers.  She was unable to get MRI due to her pacemaker.  She was back to baseline at the time of discharge and discharged home with plans to follow up  with neurology, PCP, and cardiology.  Hospital Course:  1. Acute encephalopathy  - Presents with confusion and speech difficulty after a couple weeks of mild malaise and generalized weakness  - Head CT is negative for acute findings and no focal neurologic deficit identified on exam  -  Check TSH (mildly abnormally), ammonia (pending), RPR (pending), B12 (wnl), and folate (pending); unable to get an MRI due to pacemaker - echo with moderate akinesis of the LV, mid apical anteroseptal wall, and apical segment.  EF 45-50%. (see report)  Discussed with cardiology who noted appears similar to prior echos. - carotid US without evidence of stenosis in R or L ICA - PT recommends no PT follow up  - suspect her acute encephalopathy related to chronic word finding difficulties and mild neurocognitive deficit vs possible TIA.  Will continue eliquis.  Refer to neurology outpatient.  She's had lots of stress related to COVID and husband's death.  Recommended considering zoloft.    2. Hypokalemia  - replace and follow  3. Paroxysmal atrial fibrillation  - CHADS-VASc is at least 476 (age x2, CVA x2, gender, HTN) - Continue Eliquis   4. History of CVA - Presents with confusion and speech difficulty as above, no focal findings on exam, and head CT without acute findings  - She has not tolerated statins  - Continue Eliquis    5. CKD III  - SCr is 1.36 on admission, similar to priors  - Renally-dose medications    6. Hypothyroidism  - Check TSH given the presentation  - Continue Synthroid   7. Hypertension  - Continue amiloride-HCTZ  8. Dementia  - Mild impairment per daughter, continues to live alone and drive herself  - Continue Aricept   Procedures: Echo IMPRESSIONS    1. Moderate akinesis of the left ventricular, mid-apical anteroseptal wall and apical segment.  2. The left ventricle has mildly reduced systolic function, with an ejection fraction of 45-50%. The cavity size was normal. Left ventricular diastolic Doppler parameters are indeterminate.  3. The right ventricle has normal systolic function. The cavity was normal. There is no increase in right ventricular wall thickness.  4. Mitral valve repair noted with annuloplasty ring.  5. The aortic valve is  tricuspid.  6. The inferior vena cava was normal in size with <50% respiratory variability.  7. No intracardiac thrombi or masses were visualized.  Carotid dopplers Summary: Right Carotid: There is no evidence of stenosis in the right ICA. See technical                comments listed above.  Left Carotid: There is no evidence of stenosis in the left ICA. See technical               comments listed above.  Vertebrals:  Bilateral vertebral arteries demonstrate antegrade flow. Subclavians: Normal flow hemodynamics were seen in bilateral subclavian              arteries.  Consultations:  none  Discharge Exam: Vitals:   12/27/18 0517 12/27/18 1438  BP: (!) 150/79 136/73  Pulse: 87 91  Resp: 18 16  Temp: 97.7 F (36.5 C) 98 F (36.7 C)  SpO2: 95% 96%   A&OX3 Feels back to normal Has had word finding and cognitive difficulties since her surgery Stress, depression, anxiety since her husband died and covid Thinks possibly related Discussed with daughter  General: No acute distress. Cardiovascular: Heart sounds show a regular rate, and rhythm. No gallops or rubs. No  murmurs. No JVD. Lungs: Clear to auscultation bilaterally with good air movement. No rales, rhonchi or wheezes. Abdomen: Soft, nontender, nondistended with normal active bowel sounds. No masses. No hepatosplenomegaly. Neurological: Alert and oriented 3. Moves all extremities 4 with equal strength. Cranial nerves II through XII grossly intact. Skin: Warm and dry. No rashes or lesions. Extremities: No clubbing or cyanosis. No edema. Psychiatric: Mood and affect are normal. Insight and judgment are appropriate.  Discharge Instructions   Discharge Instructions    Ambulatory referral to Neurology   Complete by: As directed    An appointment is requested in approximately: 4 weeks   Call MD for:  difficulty breathing, headache or visual disturbances   Complete by: As directed    Call MD for:  extreme fatigue    Complete by: As directed    Call MD for:  hives   Complete by: As directed    Call MD for:  persistant dizziness or light-headedness   Complete by: As directed    Call MD for:  persistant nausea and vomiting   Complete by: As directed    Call MD for:  redness, tenderness, or signs of infection (pain, swelling, redness, odor or green/yellow discharge around incision site)   Complete by: As directed    Call MD for:  severe uncontrolled pain   Complete by: As directed    Call MD for:  temperature >100.4   Complete by: As directed    Diet - low sodium heart healthy   Complete by: As directed    Discharge instructions   Complete by: As directed    You were seen for difficulty word finding and altered mental status.  This has improved at the time of my evaluation.  We were unable to get an MRI due to your pacemaker.    It's unclear if this was a fluctuation in your mental status related to your anxiety/depression and word finding difficulty at baseline versus something like a TIA (or mini stroke).  Your workup was relatively unremarkable.  Continue your eliquis and follow up with your PCP for your cholesterol treatment.   We'll refer you to neurology as an outpatient.    Consider a medication to help with your depression/anxiety like zoloft.  Please discuss this with your PCP going forward.   You'll need a repeat chest x ray outpatient, you had a small amount of fluid on your lungs.  You have pending labs that your PCP should review with you after discharge.   Return for new, recurrent, or worsening symptoms.  Please ask your PCP to request records from this hospitalization so they know what was done and what the next steps will be.   Increase activity slowly   Complete by: As directed      Allergies as of 12/27/2018      Reactions   Pepcid [famotidine] Anaphylaxis, Swelling, Other (See Comments)   Causes "Throat Swelling"   Allopurinol Other (See Comments)   Caused headaches    Amlodipine Other (See Comments)   Results in PEDAL EDEMA    Atorvastatin Other (See Comments)   Causes her leg muscles to ache   Contrast Media [iodinated Diagnostic Agents] Hives   IVP dye per patient   Cephalexin Itching, Rash   Ciprofloxacin Itching, Rash   Colchicine Rash   Erythromycin Itching, Rash   Prilosec [omeprazole] Nausea Only   Sulfonamide Derivatives Itching, Rash   Tetracycline Itching, Rash      Medication List    TAKE  these medications   acetaminophen 500 MG tablet Commonly known as: TYLENOL Take 500 mg by mouth every 6 (six) hours as needed for moderate pain.   amiloride-hydrochlorothiazide 5-50 MG tablet Commonly known as: MODURETIC Take 0.5 tablets by mouth daily.   apixaban 5 MG Tabs tablet Commonly known as: Eliquis Take 1 tablet (5 mg total) by mouth 2 (two) times daily.   calcitRIOL 0.25 MCG capsule Commonly known as: ROCALTROL Take 0.25 mcg by mouth every other day. In the morning   conjugated estrogens vaginal cream Commonly known as: PREMARIN Place 1 Applicatorful vaginally at bedtime.   donepezil 5 MG tablet Commonly known as: ARICEPT Take 5 mg by mouth daily.   fexofenadine 180 MG tablet Commonly known as: ALLEGRA Take 180 mg by mouth daily.   fluticasone 50 MCG/ACT nasal spray Commonly known as: FLONASE Place 2 sprays into both nostrils daily.   levothyroxine 75 MCG tablet Commonly known as: SYNTHROID Take 75 mcg by mouth daily.   vitamin B-12 1000 MCG tablet Commonly known as: CYANOCOBALAMIN Take 1,000 mcg by mouth once a week.      Allergies  Allergen Reactions  . Pepcid [Famotidine] Anaphylaxis, Swelling and Other (See Comments)    Causes "Throat Swelling"   . Allopurinol Other (See Comments)    Caused headaches  . Amlodipine Other (See Comments)    Results in PEDAL EDEMA   . Atorvastatin Other (See Comments)    Causes her leg muscles to ache  . Contrast Media [Iodinated Diagnostic Agents] Hives    IVP dye per  patient  . Cephalexin Itching and Rash  . Ciprofloxacin Itching and Rash  . Colchicine Rash  . Erythromycin Itching and Rash  . Prilosec [Omeprazole] Nausea Only  . Sulfonamide Derivatives Itching and Rash  . Tetracycline Itching and Rash   Follow-up Information    [name], MD Follow up.   Specialty: Family Medicine Contact information: [address] [place] [address] [phone]        Quintella Reichert, MD .   Specialty: Cardiology Contact information: [phone] [address] [place] [address] [phone]            The results of significant diagnostics from this hospitalization (including imaging, microbiology, ancillary and laboratory) are listed below for reference.    Significant Diagnostic Studies: Ct Head Wo Contrast  Result Date: 12/26/2018 CLINICAL DATA:  Altered level of consciousness (LOC), unexplained. Confusion. EXAM: CT HEAD WITHOUT CONTRAST TECHNIQUE: Contiguous axial images were obtained from the base of the skull through the vertex without intravenous contrast. COMPARISON:  Head CT 04/19/2017 FINDINGS: Brain: No evidence of acute infarction, hemorrhage, hydrocephalus, extra-axial collection or mass lesion/mass effect. Generalized atrophy and moderate to advanced chronic small vessel ischemia, similar to prior exam. Remote right parietal infarct with encephalomalacia, also unchanged. Remote lacunar infarcts in the right basal ganglia. Vascular: Atherosclerosis of skullbase vasculature without hyperdense vessel or abnormal calcification. Skull: No fracture or focal lesion. Sinuses/Orbits: Paranasal sinuses and mastoid air cells are clear. The visualized orbits are unremarkable. Bilateral cataract resection. Other: None. IMPRESSION: 1. No acute intracranial abnormality. 2. Stable atrophy. Stable chronic small vessel ischemia and remote parietal infarct. Electronically Signed   By: Narda Rutherford M.D.   On: 12/26/2018 22:19   Dg Chest  Port 1 View  Result Date: 12/26/2018 CLINICAL DATA:  Altered mental status EXAM: PORTABLE CHEST 1 VIEW COMPARISON:  08/19/2017 FINDINGS: Left-sided pacing device as before. Valve prosthesis and vascular clip. Small right-sided pleural  effusion. Mild cardiomegaly with aortic atherosclerosis. No pneumothorax. IMPRESSION: 1. Cardiomegaly. 2. Small right-sided pleural effusion with hazy atelectasis at the right base. Electronically Signed   By: Jasmine Pang M.D.   On: 12/26/2018 21:14   Vas US Carotid  Result Date: 12/27/2018 Carotid Arterial Duplex Study Indications:       TIA versus CVA , and Speech disturbance. Risk Factors:      Hypertension. Other Factors:     H/O MVR. Paroxymal atrial fibrillation on Eliquis, CKD stage                    III, H/O CVA Comparison Study:  04/12/2017 available as part of the pre MVR workup Performing Technologist: Toma Deiters RVS  Examination Guidelines: A complete evaluation includes B-mode imaging, spectral Doppler, color Doppler, and power Doppler as needed of all accessible portions of each vessel. Bilateral testing is considered an integral part of a complete examination. Limited examinations for reoccurring indications may be performed as noted.  Right Carotid Findings: +----------+--------+--------+--------+--------+-------------------------+           PSV cm/sEDV cm/sStenosisDescribeComments                  +----------+--------+--------+--------+--------+-------------------------+ CCA Prox  50      10                      mild intimal wallchanges  +----------+--------+--------+--------+--------+-------------------------+ CCA Distal49      12                      mild intimal wall changes +----------+--------+--------+--------+--------+-------------------------+ ICA Prox  52      14                      mild intimal wall changes +----------+--------+--------+--------+--------+-------------------------+ ICA Mid   76      18                       mild intimal wall changes +----------+--------+--------+--------+--------+-------------------------+ ICA Distal73      13                      tortuous                  +----------+--------+--------+--------+--------+-------------------------+ ECA       62      6                       mild intimal wall changes +----------+--------+--------+--------+--------+-------------------------+ +----------+--------+-------+--------+-------------------+           PSV cm/sEDV cmsDescribeArm Pressure (mmHG) +----------+--------+-------+--------+-------------------+ [id number]      3                                  +----------+--------+-------+--------+-------------------+ +---------+--------+--+--------+-+---------+ VertebralPSV cm/s35EDV cm/s6Antegrade +---------+--------+--+--------+-+---------+ No change from previous exam  Left Carotid Findings: +----------+--------+--------+--------+--------+-------------------------+           PSV cm/sEDV cm/sStenosisDescribeComments                  +----------+--------+--------+--------+--------+-------------------------+ CCA Prox  53      12                      mild intimal wall changes +----------+--------+--------+--------+--------+-------------------------+ CCA Distal51      10  mild intimal wall changes +----------+--------+--------+--------+--------+-------------------------+ ICA Prox  43      9                       mild intimal wall changes +----------+--------+--------+--------+--------+-------------------------+ ICA Mid   66      19                      mild intimal wall changes +----------+--------+--------+--------+--------+-------------------------+ ICA Distal68      13                      tortuous                  +----------+--------+--------+--------+--------+-------------------------+ ECA       56      9                       mild intimal wall changes  +----------+--------+--------+--------+--------+-------------------------+ +----------+--------+--------+--------+-------------------+ SubclavianPSV cm/sEDV cm/sDescribeArm Pressure (mmHG) +----------+--------+--------+--------+-------------------+           79      2                                   +----------+--------+--------+--------+-------------------+ +---------+--------+--+--------+--+---------+ VertebralPSV cm/s49EDV cm/s12Antegrade +---------+--------+--+--------+--+---------+ No change from previous exam  Summary: Right Carotid: There is no evidence of stenosis in the right ICA. See technical                comments listed above. Left Carotid: There is no evidence of stenosis in the left ICA. See technical               comments listed above. Vertebrals:  Bilateral vertebral arteries demonstrate antegrade flow. Subclavians: Normal flow hemodynamics were seen in bilateral subclavian              arteries. *See table(s) above for measurements and observations.     Preliminary     Microbiology: Recent Results (from the past 240 hour(s))  SARS Coronavirus 2 (CEPHEID - Performed in [place] hospital lab), Hosp Order     Status: None   Collection Time: 12/26/18  9:09 PM   Specimen: Nasopharyngeal  Result Value Ref Range Status   SARS Coronavirus 2 NEGATIVE NEGATIVE Final    Comment: (NOTE) If result is NEGATIVE SARS-CoV-2 target nucleic acids are NOT DETECTED. The SARS-CoV-2 RNA is generally detectable in upper and lower  respiratory specimens during the acute phase of infection. The lowest  concentration of SARS-CoV-2 viral copies this assay can detect is 250  copies / mL. A negative result does not preclude SARS-CoV-2 infection  and should not be used as the sole basis for treatment or other  patient management decisions.  A negative result may occur with  improper specimen collection / handling, submission of specimen other  than nasopharyngeal swab, presence of  viral mutation(s) within the  areas targeted by this assay, and inadequate number of viral copies  (<250 copies / mL). A negative result must be combined with clinical  observations, patient history, and epidemiological information. If result is POSITIVE SARS-CoV-2 target nucleic acids are DETECTED. The SARS-CoV-2 RNA is generally detectable in upper and lower  respiratory specimens dur ing the acute phase of infection.  Positive  results are indicative of active infection with SARS-CoV-2.  Clinical  correlation with patient history and other diagnostic information is  necessary to determine  patient infection status.  Positive results do  not rule out bacterial infection or co-infection with other viruses. If result is PRESUMPTIVE POSTIVE SARS-CoV-2 nucleic acids MAY BE PRESENT.   A presumptive positive result was obtained on the submitted specimen  and confirmed on repeat testing.  While 2019 novel coronavirus  (SARS-CoV-2) nucleic acids may be present in the submitted sample  additional confirmatory testing may be necessary for epidemiological  and / or clinical management purposes  to differentiate between  SARS-CoV-2 and other Sarbecovirus currently known to infect humans.  If clinically indicated additional testing with an alternate test  methodology [phone]([id number]) is advised. The SARS-CoV-2 RNA is generally  detectable in upper and lower respiratory sp ecimens during the acute  phase of infection. The expected result is Negative. Fact Sheet for Patients:  BoilerBrush.com.cyhttps://www.fda.gov/media/136312/download Fact Sheet for Healthcare Providers: https://pope.com/https://www.fda.gov/media/136313/download This test is not yet approved or cleared by the [place] FDA and has been authorized for detection and/or diagnosis of SARS-CoV-2 by FDA under an Emergency Use Authorization (EUA).  This EUA will remain in effect (meaning this test can be used) for the duration of the COVID-19 declaration under Section  564(b)(1) of the Act, 21 U.S.C. section 360bbb-3(b)(1), unless the authorization is terminated or revoked sooner. Performed at [facility], [address] [place], [place] [id number]      Labs: Basic Metabolic Panel: Recent Labs  Lab 12/26/18 2109 12/26/18 2119 12/27/18 0145  NA 142  --  143  K 2.8*  --  3.2*  CL 101  --  106  CO2 29  --  26  GLUCOSE 116*  --  122*  BUN 23  --  21  CREATININE 1.36*  --  1.29*  CALCIUM 10.2  --  9.6  MG  --  2.1  --    Liver Function Tests: Recent Labs  Lab 12/26/18 2109  AST 17  ALT 11  ALKPHOS 61  BILITOT 0.5  PROT 7.6  ALBUMIN 4.3   No results for input(s): LIPASE, AMYLASE in the last 168 hours. Recent Labs  Lab 12/27/18 0145  AMMONIA 12   CBC: Recent Labs  Lab 12/26/18 2109 12/27/18 0145  WBC 10.3 10.3  NEUTROABS 5.8  --   HGB 14.3 13.7  HCT 43.6 42.5  MCV 95.8 94.7  PLT 248 220   Cardiac Enzymes: Recent Labs  Lab 12/26/18 2109  TROPONINI <0.03   BNP: BNP (last 3 results) No results for input(s): BNP in the last 8760 hours.  ProBNP (last 3 results) No results for input(s): PROBNP in the last 8760 hours.  CBG: No results for input(s): GLUCAP in the last 168 hours.     Signed:  Lacretia Nicksaldwell Powell MD.  [facility] 12/27/2018, 6:51 PM

## 2018-12-28 LAB — URINE CULTURE: Culture: NO GROWTH

## 2018-12-29 LAB — HEMOGLOBIN A1C
Hgb A1c MFr Bld: 5.6 % (ref 4.8–5.6)
Mean Plasma Glucose: 114 mg/dL

## 2018-12-30 LAB — FOLATE RBC
Folate, Hemolysate: 534 ng/mL
Folate, RBC: 1296 ng/mL (ref >498)
Hematocrit: 41.2 % (ref 34.0–46.6)

## 2019-01-06 ENCOUNTER — Telehealth: Payer: Self-pay

## 2019-01-06 NOTE — Telephone Encounter (Signed)
Patient will come in the office for her appt tomorrow. Patient answers no to all screening questions below. Patient will be allowed 1 visitor for appt due to cognitive issues. Patient understands not to arrive more than 15 minutes prior to the scheduled appointment time and that a mask will be required for the visit.      COVID-19 Pre-Screening Questions:  . In the past 7 to 10 days have you had a cough,  shortness of breath, headache, congestion, fever (100 or greater) body aches, chills, sore throat, or sudden loss of taste or sense of smell? NO . Have you been around anyone with known Covid 19? NO . Have you been around anyone who is awaiting Covid 19 test results in the past 7 to 10 days? NO . Have you been around anyone who has been exposed to Covid 19, or has mentioned symptoms of Covid 19 within the past 7 to 10 days? NO

## 2019-01-07 ENCOUNTER — Ambulatory Visit: Payer: Medicare Other | Admitting: Physician Assistant

## 2019-01-27 ENCOUNTER — Ambulatory Visit: Payer: Medicare Other | Admitting: Cardiology

## 2019-02-16 ENCOUNTER — Ambulatory Visit (INDEPENDENT_AMBULATORY_CARE_PROVIDER_SITE_OTHER): Payer: Medicare Other | Admitting: *Deleted

## 2019-02-16 DIAGNOSIS — I4430 Unspecified atrioventricular block: Secondary | ICD-10-CM | POA: Diagnosis not present

## 2019-02-16 LAB — CUP PACEART REMOTE DEVICE CHECK
Battery Remaining Longevity: 113 mo
Battery Remaining Percentage: 95.5 %
Battery Voltage: 2.99 V
Brady Statistic AP VP Percent: 22 %
Brady Statistic AP VS Percent: 1 %
Brady Statistic AS VP Percent: 73 %
Brady Statistic AS VS Percent: 2 %
Brady Statistic RA Percent Paced: 19 %
Brady Statistic RV Percent Paced: 95 %
Date Time Interrogation Session: 20200810060016
Implantable Lead Implant Date: 20160701
Implantable Lead Implant Date: 20160701
Implantable Lead Location: 753859
Implantable Lead Location: 753860
Implantable Pulse Generator Implant Date: 20160701
Lead Channel Impedance Value: 350 Ohm
Lead Channel Impedance Value: 460 Ohm
Lead Channel Pacing Threshold Amplitude: 0.75 V
Lead Channel Pacing Threshold Amplitude: 1.125 V
Lead Channel Pacing Threshold Pulse Width: 0.4 ms
Lead Channel Pacing Threshold Pulse Width: 0.4 ms
Lead Channel Sensing Intrinsic Amplitude: 12 mV
Lead Channel Sensing Intrinsic Amplitude: 3.4 mV
Lead Channel Setting Pacing Amplitude: 1.375
Lead Channel Setting Pacing Amplitude: 2 V
Lead Channel Setting Pacing Pulse Width: 0.4 ms
Lead Channel Setting Sensing Sensitivity: 2 mV
Pulse Gen Model: 2240
Pulse Gen Serial Number: 7785935

## 2019-02-16 NOTE — Progress Notes (Addendum)
Cardiology Office Note    Date:  02/17/2019   ID:  Amanda [name], DOB 11/05/1940, MRN [id number]  PCP:  [name], MD  Cardiologist: Armanda Magic, MD EPS: Lewayne Bunting, MD  Chief Complaint  Patient presents with  . Follow-up    History of Present Illness:  Amanda [name] Amanda a 78 y.o. [name] with history of severe MR status post minimally invasive mitral valve repair and Maze procedure 04/2017 complicated by right thoracentesis and requiring inpatient rehab.  She then underwent right Pleurx catheter for recurrence.  Also has history of PAF chads2vasc 4 on Eliquis, PACs PVCs, hypertension.  Patient had a telemedicine visit with [name] in 10/13/2018 at which time she was feeling a bit depressed after her husband had died in Aug 07, 2022.  She was having some dizziness in the morning after taking Lopressor and had cut down to 25 mg in the morning and stop the evening dose.  She was further instructed to decrease metoprolol to 12.5 mg in the morning.  She Amanda followed by Dr. [name]  for CKD and he keeps her on low-dose Lasix.  Was discharged from the hospital 12/27/2018 with diagnosis of acute encephalopathy and episode of word finding difficulty which resolved spontaneously.  Felt secondary to mild neuro cognitive deficit versus possible TIA.  Potassium was 3.2 and was replaced creatinine 1.29, TSH mildly elevated at 5.185 continue Eliquis and follow-up with neurology as an outpatient.  Neurology suspected metabolicenc ephalopathy    Could not have MRI due to pacemaker.  CT negative for acute findings echo akinesis of the mid apical anteroseptal wall and apical segment LVEF 45 to 50% similar to prior echoes.  Carotid ultrasound without stenosis.   Patient comes in with her daughter. Denies chest pain, shortness of breath, dizziness or presyncope. No regular exercise. Now off metoprolol because BP got so low. Pacer check 02/16/19 normal device function and 10 AMS, longest 8 seconds.       Past Medical History:  Diagnosis Date  . Allergic rhinitis   . Anemia   . Asymptomatic LV dysfunction    EF 45-50% echo 08-07-17  . CKD (chronic kidney disease), stage III (HCC)    stage III  . DJD (degenerative joint disease)   . Glomerulonephritis    Dr [name]  . Hyperlipidemia   . Hyperparathyroidism (HCC)   . Hypertension   . Hypothyroidism   . IBS (irritable bowel syndrome)   . Interstitial cystitis   . Mitral regurgitation   . Mobitz II    a. s/p STJ dual chamber PPM   . MVP (mitral valve prolapse)    moderate posterior MVP with moderate MR and grade II diasotlic dysfunction  . PAC (premature atrial contraction)   . PAF (paroxysmal atrial fibrillation) (HCC)     note on pacer check. CHADS2VASC score Amanda 4 now on Eliquis.  [name] PVC's (premature ventricular contractions)   . S/P minimally invasive maze operation for atrial fibrillation 04/16/2017   Complete bilateral atrial lesion set using cryothermy and bipolar radiofrequency ablation with clipping of LA appendage via right mini thoracotomy approach  . S/P minimally invasive mitral valve repair 04/16/2017   Complex valvuloplasty including triangular resection of posterior leaflet, artificial Gore-tex neochords x6 and Sorin Memo 3D ring annuloplasty ([id number], size 32, serial # [id number])  . S/P placement of cardiac pacemaker   . Small vessel disease, cerebrovascular   . Stroke [facility])    a. old stroke seen on imaging.    Past  Surgical History:  Procedure Laterality Date  . ABDOMINAL HYSTERECTOMY    . BREAST BIOPSY Right   . CHEST TUBE INSERTION Right 05/23/2017   Procedure: INSERTION PLEURAL DRAINAGE CATHETER;  Surgeon: Purcell Nails, MD;  Location: [facility] OR;  Service: Thoracic;  Laterality: Right;  possible pleurex catheter  . CLIPPING OF ATRIAL APPENDAGE  04/16/2017   Procedure: CLIPPING OF ATRIAL APPENDAGE using AtriCure Pro2 clip 45;  Surgeon: Purcell Nails, MD;  Location: [facility] OR;  Service: Open Heart Surgery;;  .  CYSTOSTOMY W/ BLADDER BIOPSY    . EP IMPLANTABLE DEVICE N/A 01/07/2015   STJ dual chamber pacemaker implanted by Dr [name] for 2:1 heart block  . MINIMALLY INVASIVE MAZE PROCEDURE N/A 04/16/2017   Procedure: MINIMALLY INVASIVE MAZE PROCEDURE;  Surgeon: Purcell Nails, MD;  Location: [facility] OR;  Service: Open Heart Surgery;  Laterality: N/A;  . MITRAL VALVE REPAIR Right 04/16/2017   Procedure: MINIMALLY INVASIVE MITRAL VALVE REPAIR (MVR);  Surgeon: Purcell Nails, MD;  Location: [facility] OR;  Service: Open Heart Surgery;  Laterality: Right;  . MITRAL VALVE REPAIR Right 04/16/2017   Procedure: RE-EXPLORATION RIGHT THORACOTOMY FOR BLEEDING;  Surgeon: Purcell Nails, MD;  Location: [facility] OR;  Service: Open Heart Surgery;  Laterality: Right;  . PERIPHERAL VASCULAR CATHETERIZATION N/A 02/03/2015   Procedure: Upper Extremity Venography;  Surgeon: [name], MD;  Location: [facility] INVASIVE CV LAB;  Service: Cardiovascular;  Laterality: N/A;  . REMOVAL OF PLEURAL DRAINAGE CATHETER Right 09/03/2017   Procedure: REMOVAL OF PLEURAL DRAINAGE CATHETER;  Surgeon: Purcell Nails, MD;  Location: [facility] OR;  Service: Thoracic;  Laterality: Right;  . RIGHT/LEFT HEART CATH AND CORONARY ANGIOGRAPHY N/A 03/26/2017   Procedure: RIGHT/LEFT HEART CATH AND CORONARY ANGIOGRAPHY;  Surgeon: [name], MD;  Location: [facility] INVASIVE CV LAB;  Service: Cardiovascular;  Laterality: N/A;  . TEE WITHOUT CARDIOVERSION N/A 03/18/2017   Procedure: TRANSESOPHAGEAL ECHOCARDIOGRAM (TEE);  Surgeon: [name], MD;  Location: [facility] ENDOSCOPY;  Service: Cardiovascular;  Laterality: N/A;  . TEE WITHOUT CARDIOVERSION N/A 04/16/2017   Procedure: TRANSESOPHAGEAL ECHOCARDIOGRAM (TEE);  Surgeon: Purcell Nails, MD;  Location: [facility] OR;  Service: Open Heart Surgery;  Laterality: N/A;  . TONSILLECTOMY      Current Medications: Current Meds  Medication Sig  . acetaminophen (TYLENOL) 500 MG tablet Take 500 mg by mouth every 6 (six) hours as needed for  moderate pain.   [name] amoxicillin (AMOXIL) 875 MG tablet   . apixaban (ELIQUIS) 5 MG TABS tablet Take 1 tablet (5 mg total) by mouth 2 (two) times daily.  . calcitRIOL (ROCALTROL) 0.25 MCG capsule Take 0.25 mcg by mouth every other day. In the morning   . conjugated estrogens (PREMARIN) vaginal cream Place 1 Applicatorful vaginally at bedtime.   . donepezil (ARICEPT) 5 MG tablet Take 5 mg by mouth daily.  . fexofenadine (ALLEGRA) 180 MG tablet Take 180 mg by mouth daily.  . fluticasone (FLONASE) 50 MCG/ACT nasal spray Place 2 sprays into both nostrils daily.   [name] levothyroxine (SYNTHROID, LEVOTHROID) 75 MCG tablet Take 75 mcg by mouth daily.  . vitamin B-12 (CYANOCOBALAMIN) 1000 MCG tablet Take 1,000 mcg by mouth once a week.     Allergies:   Pepcid [famotidine], Allopurinol, Amlodipine, Atorvastatin, Contrast media [iodinated diagnostic agents], Cephalexin, Ciprofloxacin, Colchicine, Erythromycin, Prilosec [omeprazole], Sulfonamide derivatives, and Tetracycline   Social History   Socioeconomic History  . Marital status: Married    Spouse name: Not on file  . Number of  children: 2  . Years of education: Not on file  . Highest education level: Not on file  Occupational History  . Occupation: retired  Scientific laboratory technician  . Financial resource strain: Not on file  . Food insecurity    Worry: Not on file    Inability: Not on file  . Transportation needs    Medical: Not on file    Non-medical: Not on file  Tobacco Use  . Smoking status: Former Research scientist (life sciences)  . Smokeless tobacco: Never Used  Substance and Sexual Activity  . Alcohol use: No  . Drug use: No  . Sexual activity: Not on file  Lifestyle  . Physical activity    Days per week: Not on file    Minutes per session: Not on file  . Stress: Not on file  Relationships  . Social Herbalist on phone: Not on file    Gets together: Not on file    Attends religious service: Not on file    Active member of club or organization: Not on  file    Attends meetings of clubs or organizations: Not on file    Relationship status: Not on file  Other Topics Concern  . Not on file  Social History Narrative   Patient drinks 1-2 cups of caffeine daily.   Patient Amanda right handed.      Family History:  The patient's   family history includes Brain cancer in her brother; Heart attack in her brother and father; Heart disease in her brother, father, and mother; Hypertension in her brother, mother, and sister; Melanoma in her brother; Ovarian cancer in her sister; Rheum arthritis in her sister; Stroke in her mother.   ROS:   Please see the history of present illness.    ROS All other systems reviewed and are negative.   PHYSICAL EXAM:   VS:  BP (!) 154/72   Pulse 86   Ht 5\' 7"  (1.702 m)   Wt 128 lb 3.2 oz (58.2 kg)   SpO2 98%   BMI 20.08 kg/m   Physical Exam  GEN: Well nourished, well developed, in no acute distress  Neck: no JVD, carotid bruits, or masses Cardiac:RRR; no murmurs, rubs, or gallops  Respiratory:  clear to auscultation bilaterally, normal work of breathing GI: soft, nontender, nondistended, + BS Ext: without cyanosis, clubbing, or edema, Good distal pulses bilaterally Neuro:  Alert and Oriented x 3 Psych: euthymic mood, full affect  Wt Readings from Last 3 Encounters:  02/17/19 128 lb 3.2 oz (58.2 kg)  12/27/18 135 lb 3.2 oz (61.3 kg)  10/30/18 134 lb (60.8 kg)      Studies/Labs Reviewed:   EKG:  EKG Amanda not ordered today.    Recent Labs: 12/26/2018: ALT 11; Magnesium 2.1 12/27/2018: BUN 21; Creatinine, Ser 1.29; Hemoglobin 13.7; Platelets 220; Potassium 3.2; Sodium 143; TSH 5.185   Lipid Panel    Component Value Date/Time   CHOL 209 (H) 12/27/2018 0145   TRIG 89 12/27/2018 0145   HDL 57 12/27/2018 0145   CHOLHDL 3.7 12/27/2018 0145   VLDL 18 12/27/2018 0145   LDLCALC 134 (H) 12/27/2018 0145    Additional studies/ records that were reviewed today include:   Echo 12/2018 IMPRESSIONS      1.  Moderate akinesis of the left ventricular, mid-apical anteroseptal wall and apical segment.  2. The left ventricle has mildly reduced systolic function, with an ejection fraction of 45-50%. The cavity size was normal. Left ventricular diastolic  Doppler parameters are indeterminate.  3. The right ventricle has normal systolic function. The cavity was normal. There Amanda no increase in right ventricular wall thickness.  4. Mitral valve repair noted with annuloplasty ring.  5. The aortic valve Amanda tricuspid.  6. The inferior vena cava was normal in size with <50% respiratory variability.  7. No intracardiac thrombi or masses were visualized.   Carotid dopplers Summary: Right Carotid: There Amanda no evidence of stenosis in the right ICA. See technical                comments listed above.   Left Carotid: There Amanda no evidence of stenosis in the left ICA. See technical               comments listed above.   Vertebrals:  Bilateral vertebral arteries demonstrate antegrade flow. Subclavians: Normal flow hemodynamics were seen in bilateral subclavian              arteries.     2Decho 02/21/18 Study Conclusions   - Procedure narrative: Transthoracic echocardiography. Image   quality was adequate. The study was technically difficult. - Left ventricle: The cavity size was normal. Wall thickness was   increased in a pattern of mild LVH. Systolic function was normal.   The estimated ejection fraction was in the range of 55% to 60%.   Possible hypokinesis of the apical myocardium. The study Amanda not   technically sufficient to allow evaluation of LV diastolic   function. - Mitral valve: Annuloplasty ring s/p MV repair. Normal function.   There was trivial regurgitation. - Right ventricle: Pacer wire or catheter noted in right ventricle.         ASSESSMENT:    1. PVC's (premature ventricular contractions)   2. S/P minimally invasive mitral valve repair + maze procedure   3. Essential hypertension    4. CKD (chronic kidney disease), stage III (HCC)   5. S/P placement of cardiac pacemaker      PLAN:  In order of problems listed above:  PAF chadsvasc equals 4 on Eliquis recent hospitalization with encephalopathy and questionable TIA symptoms resolved. Hasn't been taking eliquis exactly 12 hrs apart but will try to do better. 2D echo 12/27/2018 LVEF 45 to 50% which cardiology reviewed and felt was similar to her prior echo but EF 55 to 60% 03/2018-no CHF symptoms. F/U with Dr. Mayford Knifeurner in 4-6 months.  Addendum: Dr. Mayford Knifeurner reviewed her echo and didn't feel it was changed from prior echo and no need to repeat.   Status post minimally invasive mitral valve repair Maze procedure 04/2017 MV stable on echo  Essential hypertension blood pressures been running low and beta-blocker Amanda slowly been decreased and then stopped.  BP up today but has been normal at home.  CKD stage III followed by Dr. Darrick Pennaeterding just finished potasssium supplement. Will recheck today.  Pacemaker followed by Dr. Jamesetta Orleansaylor-appt with EPS tomorrow  Medication Adjustments/Labs and Tests Ordered: Current medicines are reviewed at length with the patient today.  Concerns regarding medicines are outlined above.  Medication changes, Labs and Tests ordered today are listed in the Patient Instructions below. Patient Instructions  Medication Instructions:  Your physician recommends that you continue on your current medications as directed. Please refer to the Current Medication list given to you today.  If you need a refill on your cardiac medications before your next appointment, please call your pharmacy.   Lab work: TODAY: BMET  If you have labs (blood work) drawn  today and your tests are completely normal, you will receive your results only by: [name]. MyChart Message (if you have MyChart) OR . A paper copy in the mail If you have any lab test that Amanda abnormal or we need to change your treatment, we will call you to review the  results.  Testing/Procedures: None ordered  Follow-Up: At [facility], you and your health needs are our priority.  As part of our continuing mission to provide you with exceptional heart care, we have created designated Provider Care Teams.  These Care Teams include your primary Cardiologist (physician) and Advanced Practice Providers (APPs -  Physician Assistants and Nurse Practitioners) who all work together to provide you with the care you need, when you need it. . You will need a follow up appointment in 6 months.  Please call our office 2 months in advance to schedule this appointment.  You may see Armanda Magicraci Turner, MD or one of the following Advanced Practice Providers on your designated Care Team:   . Robbie LisBrittainy Simmons, PA-C . Dayna Dunn, PA-C . Jacolyn ReedyMichele , PA-C  Any Other Special Instructions Will Be Listed Below (If Applicable).       Signed, Jacolyn ReedyMichele , PA-C  02/17/2019 1:13 PM    [facility] [address], [place], [place]  [address] Phone: [phone]; Fax: [phone]

## 2019-02-17 ENCOUNTER — Encounter (INDEPENDENT_AMBULATORY_CARE_PROVIDER_SITE_OTHER): Payer: Self-pay

## 2019-02-17 ENCOUNTER — Ambulatory Visit (INDEPENDENT_AMBULATORY_CARE_PROVIDER_SITE_OTHER): Payer: Medicare Other | Admitting: Physician Assistant

## 2019-02-17 ENCOUNTER — Other Ambulatory Visit: Payer: Self-pay

## 2019-02-17 ENCOUNTER — Encounter: Payer: Self-pay | Admitting: Physician Assistant

## 2019-02-17 VITALS — BP 154/72 | HR 86 | Ht 67.0 in | Wt 128.2 lb

## 2019-02-17 DIAGNOSIS — I10 Essential (primary) hypertension: Secondary | ICD-10-CM

## 2019-02-17 DIAGNOSIS — N183 Chronic kidney disease, stage 3 unspecified: Secondary | ICD-10-CM

## 2019-02-17 DIAGNOSIS — I493 Ventricular premature depolarization: Secondary | ICD-10-CM | POA: Diagnosis not present

## 2019-02-17 DIAGNOSIS — Z9889 Other specified postprocedural states: Secondary | ICD-10-CM

## 2019-02-17 DIAGNOSIS — Z950 Presence of cardiac pacemaker: Secondary | ICD-10-CM

## 2019-02-17 NOTE — Patient Instructions (Signed)
Medication Instructions:  Your physician recommends that you continue on your current medications as directed. Please refer to the Current Medication list given to you today.  If you need a refill on your cardiac medications before your next appointment, please call your pharmacy.   Lab work: TODAY:  BMET  If you have labs (blood work) drawn today and your tests are completely normal, you will receive your results only by: . MyChart Message (if you have MyChart) OR . A paper copy in the mail If you have any lab test that is abnormal or we need to change your treatment, we will call you to review the results.  Testing/Procedures: None ordered  Follow-Up: At [facility], you and your health needs are our priority.  As part of our continuing mission to provide you with exceptional heart care, we have created designated Provider Care Teams.  These Care Teams include your primary Cardiologist (physician) and Advanced Practice Providers (APPs -  Physician Assistants and Nurse Practitioners) who all work together to provide you with the care you need, when you need it. You will need a follow up appointment in 6 months.  Please call our office 2 months in advance to schedule this appointment.  You may see Traci Turner, MD or one of the following Advanced Practice Providers on your designated Care Team:   Brittainy Simmons, PA-C Dayna Dunn, PA-C . Michele Lenze, PA-C  Any Other Special Instructions Will Be Listed Below (If Applicable).    

## 2019-02-18 LAB — BASIC METABOLIC PANEL
BUN/Creatinine Ratio: 14 (ref 12–28)
BUN: 18 mg/dL (ref 8–27)
CO2: 28 mmol/L (ref 20–29)
Calcium: 10.1 mg/dL (ref 8.7–10.3)
Chloride: 101 mmol/L (ref 96–106)
Creatinine, Ser: 1.28 mg/dL — ABNORMAL HIGH (ref 0.57–1.00)
GFR calc Af Amer: 46 mL/min/{1.73_m2} — ABNORMAL LOW (ref >59)
GFR calc non Af Amer: 40 mL/min/{1.73_m2} — ABNORMAL LOW (ref >59)
Glucose: 89 mg/dL (ref 65–99)
Potassium: 4 mmol/L (ref 3.5–5.2)
Sodium: 145 mmol/L — ABNORMAL HIGH (ref 134–144)

## 2019-02-19 ENCOUNTER — Other Ambulatory Visit: Payer: Self-pay | Admitting: Cardiology

## 2019-02-19 NOTE — Telephone Encounter (Signed)
Prescription refill request for Eliquis received.  Last office visit: Lenze (02-17-2019) Scr: 1.28 (02-17-2019) Age: 78 y.o. Weight: 58.2 kg   Prescription refill sent.

## 2019-02-23 ENCOUNTER — Ambulatory Visit (INDEPENDENT_AMBULATORY_CARE_PROVIDER_SITE_OTHER): Payer: Medicare Other | Admitting: Student

## 2019-02-23 ENCOUNTER — Other Ambulatory Visit: Payer: Self-pay

## 2019-02-23 ENCOUNTER — Encounter: Payer: Self-pay | Admitting: Student

## 2019-02-23 VITALS — BP 132/82 | HR 92 | Wt 130.0 lb

## 2019-02-23 DIAGNOSIS — I480 Paroxysmal atrial fibrillation: Secondary | ICD-10-CM

## 2019-02-23 DIAGNOSIS — I10 Essential (primary) hypertension: Secondary | ICD-10-CM

## 2019-02-23 DIAGNOSIS — I4430 Unspecified atrioventricular block: Secondary | ICD-10-CM | POA: Diagnosis not present

## 2019-02-23 LAB — CUP PACEART INCLINIC DEVICE CHECK
Battery Remaining Longevity: 117 mo
Battery Voltage: 2.99 V
Brady Statistic RA Percent Paced: 19 %
Brady Statistic RV Percent Paced: 95 %
Date Time Interrogation Session: 20200817131614
Implantable Lead Implant Date: 20160701
Implantable Lead Implant Date: 20160701
Implantable Lead Location: 753859
Implantable Lead Location: 753860
Implantable Pulse Generator Implant Date: 20160701
Lead Channel Impedance Value: 412.5 Ohm
Lead Channel Impedance Value: 537.5 Ohm
Lead Channel Pacing Threshold Amplitude: 0.75 V
Lead Channel Pacing Threshold Amplitude: 0.75 V
Lead Channel Pacing Threshold Amplitude: 0.875 V
Lead Channel Pacing Threshold Pulse Width: 0.4 ms
Lead Channel Pacing Threshold Pulse Width: 0.4 ms
Lead Channel Pacing Threshold Pulse Width: 0.4 ms
Lead Channel Sensing Intrinsic Amplitude: 5 mV
Lead Channel Setting Pacing Amplitude: 1.125
Lead Channel Setting Pacing Amplitude: 2 V
Lead Channel Setting Pacing Pulse Width: 0.4 ms
Lead Channel Setting Sensing Sensitivity: 2 mV
Pulse Gen Model: 2240
Pulse Gen Serial Number: 7785935

## 2019-02-23 NOTE — Patient Instructions (Signed)
Medication Instructions:  Your physician recommends that you continue on your current medications as directed. Please refer to the Current Medication list given to you today.  If you need a refill on your cardiac medications before your next appointment, please call your pharmacy.   Lab work: NONE ORDERED  TODAY   If you have labs (blood work) drawn today and your tests are completely normal, you will receive your results only by: [name] MyChart Message (if you have MyChart) OR . A paper copy in the mail If you have any lab test that is abnormal or we need to change your treatment, we will call you to review the results.  Testing/Procedures: NONE ORDERED  TODAY  Follow-Up: At [facility], you and your health needs are our priority.  As part of our continuing mission to provide you with exceptional heart care, we have created designated Provider Care Teams.  These Care Teams include your primary Cardiologist (physician) and Advanced Practice Providers (APPs -  Physician Assistants and Nurse Practitioners) who all work together to provide you with the care you need, when you need it. You will need a follow up appointment in 1 years.  Please call our office 2 months in advance to schedule this appointment.  You may see Cristopher Peru, MD or one of the following Advanced Practice Providers on your designated Care Team:   Chanetta Marshall, NP . Tommye Standard, PA-C . Twanna Hy PA-C   Any Other Special Instructions Will Be Listed Below (If Applicable).

## 2019-02-23 NOTE — Progress Notes (Signed)
Electrophysiology Office Note Date: 02/23/2019  ID:  ARLOWE MARTINIE, DOB May 24, 1941, MRN [id number]  PCP: [name], MD Primary Cardiologist: Armanda Magic, MD Electrophysiologist: Lewayne Bunting, MD  CC: Pacemaker follow-up  Amanda [name] is a 78 y.o. female seen today for Dr. [name]. She presents today for routine electrophysiology followup.  Since last being seen in our clinic, the patient reports doing very well.  She denies chest pain, palpitations, dyspnea, PND, orthopnea, nausea, vomiting, dizziness, syncope, edema, weight gain, or early satiety. She has mild nausea first thing in the mornings, but plans to discuss with PCP. Otherwise she is tolerating her medications well and is without complaint.   Device History: St. Jude Dual Chamber PPM implanted 01/07/2015 for for 2:1 HB  Past Medical History:  Diagnosis Date  . Allergic rhinitis   . Anemia   . Asymptomatic LV dysfunction    EF 45-50% echo 07/2017  . CKD (chronic kidney disease), stage III (HCC)    stage III  . DJD (degenerative joint disease)   . Glomerulonephritis    Dr [name]  . Hyperlipidemia   . Hyperparathyroidism (HCC)   . Hypertension   . Hypothyroidism   . IBS (irritable bowel syndrome)   . Interstitial cystitis   . Mitral regurgitation   . Mobitz II    a. s/p STJ dual chamber PPM   . MVP (mitral valve prolapse)    moderate posterior MVP with moderate MR and grade II diasotlic dysfunction  . PAC (premature atrial contraction)   . PAF (paroxysmal atrial fibrillation) (HCC)     note on pacer check. CHADS2VASC score is 4 now on Eliquis.  [name] PVC's (premature ventricular contractions)   . S/P minimally invasive maze operation for atrial fibrillation 04/16/2017   Complete bilateral atrial lesion set using cryothermy and bipolar radiofrequency ablation with clipping of LA appendage via right mini thoracotomy approach  . S/P minimally invasive mitral valve repair 04/16/2017   Complex valvuloplasty  including triangular resection of posterior leaflet, artificial Gore-tex neochords x6 and Sorin Memo 3D ring annuloplasty ([id number], size 32, serial # [id number])  . S/P placement of cardiac pacemaker   . Small vessel disease, cerebrovascular   . Stroke [facility])    a. old stroke seen on imaging.   Past Surgical History:  Procedure Laterality Date  . ABDOMINAL HYSTERECTOMY    . BREAST BIOPSY Right   . CHEST TUBE INSERTION Right 05/23/2017   Procedure: INSERTION PLEURAL DRAINAGE CATHETER;  Surgeon: Purcell Nails, MD;  Location[facility] OR;  Service: Thoracic;  Laterality: Right;  possible pleurex catheter  . CLIPPING OF ATRIAL APPENDAGE  04/16/2017   Procedure: CLIPPING OF ATRIAL APPENDAGE using AtriCure Pro2 clip 45;  Surgeon: Purcell Nails, MD;  Location: [facility] OR;  Service: Open Heart Surgery;;  . CYSTOSTOMY W/ BLADDER BIOPSY    . EP IMPLANTABLE DEVICE N/A 01/07/2015   STJ dual chamber pacemaker implanted by Dr [name] for 2:1 heart block  . MINIMALLY INVASIVE MAZE PROCEDURE N/A 04/16/2017   Procedure: MINIMALLY INVASIVE MAZE PROCEDURE;  Surgeon: Purcell Nails, MD;  Location: [facility] OR;  Service: Open Heart Surgery;  Laterality: N/A;  . MITRAL VALVE REPAIR Right 04/16/2017   Procedure: MINIMALLY INVASIVE MITRAL VALVE REPAIR (MVR);  Surgeon: Purcell Nails, MD;  Location: [facility] OR;  Service: Open Heart Surgery;  Laterality: Right;  . MITRAL VALVE REPAIR Right 04/16/2017   Procedure: RE-EXPLORATION RIGHT THORACOTOMY FOR BLEEDING;  Surgeon: Purcell Nails, MD;  Location: [facility] OR;  Service: Open Heart Surgery;  Laterality: Right;  . PERIPHERAL VASCULAR CATHETERIZATION N/A 02/03/2015   Procedure: Upper Extremity Venography;  Surgeon: Duke SalviaSteven C Klein, MD;  [facility];  Service: Cardiovascular;  Laterality: N/A;  . REMOVAL OF PLEURAL DRAINAGE CATHETER Right 09/03/2017   Procedure: REMOVAL OF PLEURAL DRAINAGE CATHETER;  Surgeon: Purcell Nailswen, Clarence H, MD;  [facility];  Service: Thoracic;  Laterality:  Right;  . RIGHT/LEFT HEART CATH AND CORONARY ANGIOGRAPHY N/A 03/26/2017   Procedure: RIGHT/LEFT HEART CATH AND CORONARY ANGIOGRAPHY;  Surgeon: Marykay LexHarding, David W, MD;  [facility];  Service: Cardiovascular;  Laterality: N/A;  . TEE WITHOUT CARDIOVERSION N/A 03/18/2017   Procedure: TRANSESOPHAGEAL ECHOCARDIOGRAM (TEE);  Surgeon: Chilton Siandolph, Tiffany, MD;  [facility];  Service: Cardiovascular;  Laterality: N/A;  . TEE WITHOUT CARDIOVERSION N/A 04/16/2017   Procedure: TRANSESOPHAGEAL ECHOCARDIOGRAM (TEE);  Surgeon: Purcell Nailswen, Clarence H, MD;  [facility];  Service: Open Heart Surgery;  Laterality: N/A;  . TONSILLECTOMY      Current Outpatient Medications  Medication Sig Dispense Refill  . acetaminophen (TYLENOL) 500 MG tablet Take 500 mg by mouth every 6 (six) hours as needed for moderate pain.     [name]. amoxicillin (AMOXIL) 875 MG tablet     . calcitRIOL (ROCALTROL) 0.25 MCG capsule Take 0.25 mcg by mouth every other day. In the morning     . conjugated estrogens (PREMARIN) vaginal cream Place 1 Applicatorful vaginally at bedtime.     . donepezil (ARICEPT) 5 MG tablet Take 5 mg by mouth daily.    [name]. ELIQUIS 5 MG TABS tablet TAKE ONE TABLET TWICE DAILY 180 tablet 1  . fexofenadine (ALLEGRA) 180 MG tablet Take 180 mg by mouth daily.    . fluticasone (FLONASE) 50 MCG/ACT nasal spray Place 2 sprays into both nostrils daily.     [name]. levothyroxine (SYNTHROID, LEVOTHROID) 75 MCG tablet Take 75 mcg by mouth daily.    . vitamin B-12 (CYANOCOBALAMIN) 1000 MCG tablet Take 1,000 mcg by mouth once a week.     No current facility-administered medications for this visit.     Allergies:   Pepcid [famotidine], Allopurinol, Amlodipine, Atorvastatin, Contrast media [iodinated diagnostic agents], Cephalexin, Ciprofloxacin, Colchicine, Erythromycin, Prilosec [omeprazole], Sulfonamide derivatives, and Tetracycline   Social History: Social History   Socioeconomic History  . Marital status:  Married    Spouse name: Not on file  . Number of children: 2  . Years of education: Not on file  . Highest education level: Not on file  Occupational History  . Occupation: retired  Engineer, productionocial Needs  . Financial resource strain: Not on file  . Food insecurity    Worry: Not on file    Inability: Not on file  . Transportation needs    Medical: Not on file    Non-medical: Not on file  Tobacco Use  . Smoking status: Former Games developermoker  . Smokeless tobacco: Never Used  Substance and Sexual Activity  . Alcohol use: No  . Drug use: No  . Sexual activity: Not on file  Lifestyle  . Physical activity    Days per week: Not on file    Minutes per session: Not on file  . Stress: Not on file  Relationships  . Social Musicianconnections    Talks on phone: Not on file    Gets together: Not on file    Attends religious service: Not on file    Active member of club or organization: Not on file  Attends meetings of clubs or organizations: Not on file    Relationship status: Not on file  . Intimate partner violence    Fear of current or ex partner: Not on file    Emotionally abused: Not on file    Physically abused: Not on file    Forced sexual activity: Not on file  Other Topics Concern  . Not on file  Social History Narrative   Patient drinks 1-2 cups of caffeine daily.   Patient is right handed.     Family History: Family History  Problem Relation Age of Onset  . Heart disease Mother   . Stroke Mother   . Hypertension Mother   . Heart disease Father   . Heart attack Father   . Ovarian cancer Sister   . Heart disease Brother   . Rheum arthritis Sister   . Melanoma Brother   . Brain cancer Brother   . Heart attack Brother   . Hypertension Brother   . Hypertension Sister   . Colon cancer Neg Hx      Review of Systems: All other systems reviewed and are otherwise negative except as noted above.  Physical Exam: Vitals:   02/23/19 1251  BP: 132/82  Pulse: 92  Weight: 130 lb (59  kg)     GEN- The patient is well appearing, alert and oriented x 3 today.   HEENT: normocephalic, atraumatic; sclera clear, conjunctiva pink; hearing intact; oropharynx clear; neck supple  Lungs- Clear to ausculation bilaterally, normal work of breathing.  No wheezes, rales, rhonchi Heart- Regular rate and rhythm, no murmurs, rubs or gallops  GI- soft, non-tender, non-distended, bowel sounds present  Extremities- no clubbing, cyanosis, or edema  MS- no significant deformity or atrophy Skin- warm and dry, no rash or lesion; PPM pocket well healed Psych- euthymic mood, full affect Neuro- strength and sensation are intact  PPM Interrogation- reviewed in detail today,  See PACEART report  EKG:  EKG is not ordered today. The ekg from 12/29/2018 personally reviewed which shows NSR at 92 bpm.   Recent Labs: 12/26/2018: ALT 11; Magnesium 2.1 12/27/2018: Hemoglobin 13.7; Platelets 220; TSH 5.185 02/17/2019: BUN 18; Creatinine, Ser 1.28; Potassium 4.0; Sodium 145   Wt Readings from Last 3 Encounters:  02/23/19 130 lb (59 kg)  02/17/19 128 lb 3.2 oz (58.2 kg)  12/27/18 135 lb 3.2 oz (61.3 kg)     Other studies Reviewed: Additional studies/ records that were reviewed today include: Remote reports, previous office notes.    Assessment and Plan:  1.  Symptomatic bradycardia s/p St. Jude PPM  Normal PPM function See Pace Art report No changes today  2. PAF  She is maintaining NSR for the most part s/p MAZE. Total AF burden <1%. She is on Eliquis without bleeding  3. HTN Stable on current regimen  4. PVCs Remains asymptomatic with overall low burden. Continue watchful waiting  Current medicines are reviewed at length with the patient today.   The patient does not have concerns regarding her medicines.  The following changes were made today:  none  Labs/ tests ordered today include:  No orders of the defined types were placed in this encounter.   Disposition:   Follow up with  annually with Dr Lovena Le.   Jacalyn Lefevre, PA-C  02/23/2019 12:53 PM  [address] [place] [address] [phone] (office) [phone] (fax)

## 2019-02-24 NOTE — Progress Notes (Signed)
Remote pacemaker transmission.   

## 2019-03-11 ENCOUNTER — Other Ambulatory Visit: Payer: Self-pay

## 2019-03-11 DIAGNOSIS — N183 Chronic kidney disease, stage 3 unspecified: Secondary | ICD-10-CM

## 2019-03-17 ENCOUNTER — Telehealth (HOSPITAL_COMMUNITY): Payer: Self-pay | Admitting: Family Medicine

## 2019-03-17 ENCOUNTER — Ambulatory Visit (INDEPENDENT_AMBULATORY_CARE_PROVIDER_SITE_OTHER): Payer: Medicare Other | Admitting: Licensed Clinical Social Worker

## 2019-03-17 DIAGNOSIS — F321 Major depressive disorder, single episode, moderate: Secondary | ICD-10-CM | POA: Diagnosis not present

## 2019-03-20 ENCOUNTER — Other Ambulatory Visit: Payer: Medicare Other | Admitting: *Deleted

## 2019-03-20 ENCOUNTER — Other Ambulatory Visit: Payer: Self-pay

## 2019-03-20 DIAGNOSIS — N183 Chronic kidney disease, stage 3 unspecified: Secondary | ICD-10-CM

## 2019-03-20 LAB — BASIC METABOLIC PANEL
BUN/Creatinine Ratio: 14 (ref 12–28)
BUN: 20 mg/dL (ref 8–27)
CO2: 30 mmol/L — ABNORMAL HIGH (ref 20–29)
Calcium: 10.1 mg/dL (ref 8.7–10.3)
Chloride: 99 mmol/L (ref 96–106)
Creatinine, Ser: 1.43 mg/dL — ABNORMAL HIGH (ref 0.57–1.00)
GFR calc Af Amer: 40 mL/min/{1.73_m2} — ABNORMAL LOW (ref >59)
GFR calc non Af Amer: 35 mL/min/{1.73_m2} — ABNORMAL LOW (ref >59)
Glucose: 102 mg/dL — ABNORMAL HIGH (ref 65–99)
Potassium: 3.4 mmol/L — ABNORMAL LOW (ref 3.5–5.2)
Sodium: 142 mmol/L (ref 134–144)

## 2019-03-23 ENCOUNTER — Telehealth: Payer: Self-pay | Admitting: Cardiology

## 2019-03-23 DIAGNOSIS — E876 Hypokalemia: Secondary | ICD-10-CM

## 2019-03-23 MED ORDER — POTASSIUM CHLORIDE CRYS ER 20 MEQ PO TBCR: EXTENDED_RELEASE_TABLET | ORAL | 0 refills | Status: DC

## 2019-03-23 NOTE — Telephone Encounter (Signed)
-----   Message from Imogene Burn, PA-C sent at 03/23/2019  7:54 AM EDT ----- Kidney function up some. Make sure she is staying hydrated. Potassium low. Kdur 20 meq for 2 days. Recheck bmet in 2 weeks

## 2019-03-23 NOTE — Telephone Encounter (Signed)
New message   Patient's daughter states that she would like a call to go over the lab results. The daughter is on the dpr.

## 2019-03-23 NOTE — Telephone Encounter (Signed)
Spoke with patient's daughter, DPR on file, and made her aware of results and recommendations. Rx sent to preferred pharmacy and lab appt made for 9/28. She verbalized understanding and thanked me for the call.

## 2019-03-26 ENCOUNTER — Ambulatory Visit (INDEPENDENT_AMBULATORY_CARE_PROVIDER_SITE_OTHER): Payer: Medicare Other | Admitting: Licensed Clinical Social Worker

## 2019-03-26 DIAGNOSIS — F324 Major depressive disorder, single episode, in partial remission: Secondary | ICD-10-CM

## 2019-04-02 ENCOUNTER — Other Ambulatory Visit: Payer: Self-pay

## 2019-04-02 ENCOUNTER — Ambulatory Visit (INDEPENDENT_AMBULATORY_CARE_PROVIDER_SITE_OTHER): Payer: Medicare Other | Admitting: Neurology

## 2019-04-02 ENCOUNTER — Encounter: Payer: Self-pay | Admitting: Neurology

## 2019-04-02 VITALS — BP 161/82 | HR 93 | Temp 95.7°F | Ht 67.0 in | Wt 127.0 lb

## 2019-04-02 DIAGNOSIS — R404 Transient alteration of awareness: Secondary | ICD-10-CM | POA: Diagnosis not present

## 2019-04-02 DIAGNOSIS — E538 Deficiency of other specified B group vitamins: Secondary | ICD-10-CM

## 2019-04-02 DIAGNOSIS — G4489 Other headache syndrome: Secondary | ICD-10-CM | POA: Diagnosis not present

## 2019-04-02 MED ORDER — DIVALPROEX SODIUM 125 MG PO DR TAB: DELAYED_RELEASE_TABLET | ORAL | 3 refills | Status: DC

## 2019-04-02 NOTE — Patient Instructions (Signed)
We will start depakote 125 mg capsules twice a day for 2 weeks, then take 2 capsules twice a day.  Depakote (valproic acid) is a seizure medication that also has an FDA approval for migraine headache. The most common potential side effects of this medication include weight gain, tremor, or possible stomach upset. This medication can potentially cause liver problems. If confusion is noted on this medication, contact our office immediately.

## 2019-04-02 NOTE — Progress Notes (Signed)
Reason for visit: Memory disorder, cerebrovascular disease  Referring physician: Dr. Arabella MerlesMcNeill  Amanda [name] is a 78 y.o. female  History of present illness:  Amanda [name] is a 78 year old right-handed white female with a history of migraine headaches in the past.  The patient was seen through this office in October 2016 after she had a transient delirium state following a urinary tract infection.  At that time, the patient was noted to have extensive white matter changes and a prior right parietal cortical infarct.  The patient is currently treated with Eliquis for paroxysmal atrial fibrillation.  She also has a cardiac pacemaker placement and chronic renal insufficiency as well as hypertension.  The patient lost her husband in January 2020.  Since 2018, the patient has required assistance from her husband in keeping up with medications and appointments and doing the finances.  The patient had continued to operate a motor vehicle however until the death of her husband.  She has not been driving since that time, her daughter now comes over and manages the medications and appointments.  The patient has had some decompensation of her cognitive functioning with the stress of the loss of her spouse and with the isolation associated with the COVID pandemic.  The patient was admitted to the hospital on 26 December 2018 with alteration in mental status, and with increased confusion.  A CT scan of the brain was done that did not show any definite acute changes, she had a 2D echocardiogram and a carotid Doppler study that did not show any source of stroke.  The patient has had headaches off and on since that time that are in the left periorbital region, and the headaches tend to respond to Tylenol and drinking Coke with caffeine.  She will lie down and the headache is better.  The patient had another transient confusional episode within a week of this visit, she may have had some slight transient weakness of  the left leg with this episode.  The patient is getting on average 2 headaches a week now.  She has not had any blackout events.  The patient does have some mild gait instability without falls.  She denies issues controlling the bowels or the bladder.  She is sent to this office for an evaluation of the memory and confusion, she currently is on Aricept.  She has had some decreased appetite and 4 or 5 pounds of weight loss over the last 6 months.  The patient reports no focal numbness of the extremities.  Past Medical History:  Diagnosis Date   Allergic rhinitis    Anemia    Asymptomatic LV dysfunction    EF 45-50% echo 07/2017   CKD (chronic kidney disease), stage III (HCC)    stage III   DJD (degenerative joint disease)    Glomerulonephritis    Dr Darrick Pennaeterding   Hyperlipidemia    Hyperparathyroidism (HCC)    Hypertension    Hypothyroidism    IBS (irritable bowel syndrome)    Interstitial cystitis    Mitral regurgitation    Mobitz II    a. s/p STJ dual chamber PPM    MVP (mitral valve prolapse)    moderate posterior MVP with moderate MR and grade II diasotlic dysfunction   PAC (premature atrial contraction)    PAF (paroxysmal atrial fibrillation) (HCC)     note on pacer check. CHADS2VASC score is 4 now on Eliquis.   PVC's (premature ventricular contractions)    S/P minimally invasive  maze operation for atrial fibrillation 04/16/2017   Complete bilateral atrial lesion set using cryothermy and bipolar radiofrequency ablation with clipping of LA appendage via right mini thoracotomy approach   S/P minimally invasive mitral valve repair 04/16/2017   Complex valvuloplasty including triangular resection of posterior leaflet, artificial Gore-tex neochords x6 and Sorin Memo 3D ring annuloplasty ([id number], size 32, serial # [id number])   S/P placement of cardiac pacemaker    Small vessel disease, cerebrovascular    Stroke [facility])    a. old stroke seen on imaging.    Past Surgical  History:  Procedure Laterality Date   ABDOMINAL HYSTERECTOMY     BREAST BIOPSY Right    CHEST TUBE INSERTION Right 05/23/2017   Procedure: INSERTION PLEURAL DRAINAGE CATHETER;  Surgeon: Purcell Nails, MD;  Location: [facility] OR;  Service: Thoracic;  Laterality: Right;  possible pleurex catheter   CLIPPING OF ATRIAL APPENDAGE  04/16/2017   Procedure: CLIPPING OF ATRIAL APPENDAGE using AtriCure Pro2 clip 45;  Surgeon: Purcell Nails, MD;  Location: [facility] OR;  Service: Open Heart Surgery;;   CYSTOSTOMY W/ BLADDER BIOPSY     EP IMPLANTABLE DEVICE N/A 01/07/2015   STJ dual chamber pacemaker implanted by Dr [name] for 2:1 heart block   MINIMALLY INVASIVE MAZE PROCEDURE N/A 04/16/2017   Procedure: MINIMALLY INVASIVE MAZE PROCEDURE;  Surgeon: Purcell Nails, MD;  Location: [facility] OR;  Service: Open Heart Surgery;  Laterality: N/A;   MITRAL VALVE REPAIR Right 04/16/2017   Procedure: MINIMALLY INVASIVE MITRAL VALVE REPAIR (MVR);  Surgeon: Purcell Nails, MD;  Location: [facility] OR;  Service: Open Heart Surgery;  Laterality: Right;   MITRAL VALVE REPAIR Right 04/16/2017   Procedure: RE-EXPLORATION RIGHT THORACOTOMY FOR BLEEDING;  Surgeon: Purcell Nails, MD;  Location: [facility] OR;  Service: Open Heart Surgery;  Laterality: Right;   PERIPHERAL VASCULAR CATHETERIZATION N/A 02/03/2015   Procedure: Upper Extremity Venography;  Surgeon: [name], MD;  Location: [facility] INVASIVE CV LAB;  Service: Cardiovascular;  Laterality: N/A;   REMOVAL OF PLEURAL DRAINAGE CATHETER Right 09/03/2017   Procedure: REMOVAL OF PLEURAL DRAINAGE CATHETER;  Surgeon: Purcell Nails, MD;  Location: [facility] OR;  Service: Thoracic;  Laterality: Right;   RIGHT/LEFT HEART CATH AND CORONARY ANGIOGRAPHY N/A 03/26/2017   Procedure: RIGHT/LEFT HEART CATH AND CORONARY ANGIOGRAPHY;  Surgeon: [name], MD;  Location: [facility] INVASIVE CV LAB;  Service: Cardiovascular;  Laterality: N/A;   TEE WITHOUT CARDIOVERSION N/A 03/18/2017   Procedure:  TRANSESOPHAGEAL ECHOCARDIOGRAM (TEE);  Surgeon: [name], MD;  Location: [facility] ENDOSCOPY;  Service: Cardiovascular;  Laterality: N/A;   TEE WITHOUT CARDIOVERSION N/A 04/16/2017   Procedure: TRANSESOPHAGEAL ECHOCARDIOGRAM (TEE);  Surgeon: Purcell Nails, MD;  Location: [facility] OR;  Service: Open Heart Surgery;  Laterality: N/A;   TONSILLECTOMY      Family History  Problem Relation Age of Onset   Heart disease Mother    Stroke Mother    Hypertension Mother    Heart disease Father    Heart attack Father    Brain cancer Brother    Heart disease Brother    Melanoma Brother    Heart attack Brother    Hypertension Brother    Ovarian cancer Sister    Hypertension Sister    Rheum arthritis Sister    Colon cancer Neg Hx     Social history:  reports that she has quit smoking. She has never used smokeless tobacco. She reports that she does not drink alcohol or use drugs.  Medications:  Prior to Admission medications   Medication Sig Start Date End Date Taking? Authorizing Provider  acetaminophen (TYLENOL) 500 MG tablet Take 500 mg by mouth every 6 (six) hours as needed for moderate pain.    Yes [provider]  calcitRIOL (ROCALTROL) 0.25 MCG capsule Take 0.25 mcg by mouth every other day. In the morning    Yes [provider]  conjugated estrogens (PREMARIN) vaginal cream Place 1 Applicatorful vaginally at bedtime.    Yes [provider]  donepezil (ARICEPT) 5 MG tablet Take 5 mg by mouth daily. 09/23/18  Yes [provider]  ELIQUIS 5 MG TABS tablet TAKE ONE TABLET TWICE DAILY 02/19/19  Yes Turner, Eber Hong, MD  fexofenadine (ALLEGRA) 180 MG tablet Take 180 mg by mouth daily.   Yes [provider]  fluticasone (FLONASE) 50 MCG/ACT nasal spray Place 2 sprays into both nostrils daily.    Yes [provider]  levothyroxine (SYNTHROID, LEVOTHROID) 75 MCG tablet Take 75 mcg by mouth daily.   Yes [provider]    vitamin B-12 (CYANOCOBALAMIN) 1000 MCG tablet Take 1,000 mcg by mouth once a week.   Yes [provider]      Allergies  Allergen Reactions   Pepcid [Famotidine] Anaphylaxis, Swelling and Other (See Comments)    Causes "Throat Swelling"    Allopurinol Other (See Comments)    Caused headaches   Amlodipine Other (See Comments)    Results in PEDAL EDEMA    Atorvastatin Other (See Comments)    Causes her leg muscles to ache   Contrast Media [Iodinated Diagnostic Agents] Hives    IVP dye per patient   Cephalexin Itching and Rash   Ciprofloxacin Itching and Rash   Colchicine Rash   Erythromycin Itching and Rash   Prilosec [Omeprazole] Nausea Only   Sulfonamide Derivatives Itching and Rash   Tetracycline Itching and Rash    ROS:  Out of a complete 14 system review of symptoms, the patient complains only of the following symptoms, and all other reviewed systems are negative.  Weight loss, fatigue Dizziness Bruising easily Feeling cold Trace blood in the urine Allergies Memory loss, confusion, headache, weakness, dizziness Depression, anxiety, decreased energy, change in appetite, disinterest in activities  Blood pressure (!) 161/82, pulse 93, temperature (!) 95.7 F (35.4 C), height 5\' 7"  (1.702 m), weight 127 lb (57.6 kg).  Physical Exam  General: The patient is alert and cooperative at the time of the examination.  Eyes: Pupils are equal, round, and reactive to light. Discs are flat bilaterally.  Neck: The neck is supple, no carotid bruits are noted.  Respiratory: The respiratory examination is clear.  Cardiovascular: The cardiovascular examination reveals a regular rate and rhythm, no obvious murmurs or rubs are noted.  Skin: Extremities are without significant edema.  Neurologic Exam  Mental status: The patient is alert and oriented x 3 at the time of the examination. The Mini-Mental status examination done today reveals a total score of  19/30.  The Lawton-Brody instrumental activities of daily living scale was 5.  The patient is able to name 6 animals in 1 minute.  Cranial nerves: Facial symmetry is present. There is good sensation of the face to pinprick and soft touch bilaterally. The strength of the facial muscles and the muscles to head turning and shoulder shrug are normal bilaterally. Speech is well enunciated, no aphasia or dysarthria is noted. Extraocular movements are full. Visual fields are full. The tongue is midline, and  the patient has symmetric elevation of the soft palate. No obvious hearing deficits are noted.  Motor: The motor testing reveals 5 over 5 strength of all 4 extremities. Good symmetric motor tone is noted throughout.  Sensory: Sensory testing is intact to pinprick, soft touch, vibration sensation, and position sense on all 4 extremities. No evidence of extinction is noted.  The patient does have some perseveration with her answers.  Coordination: Cerebellar testing reveals good finger-nose-finger and heel-to-shin bilaterally.  Gait and station: Gait is normal. Tandem gait is slightly unsteady. Romberg is negative. No drift is seen.  Reflexes: Deep tendon reflexes are symmetric and normal bilaterally. Toes are downgoing bilaterally.   CT head 12/26/18:  IMPRESSION: 1. No acute intracranial abnormality. 2. Stable atrophy. Stable chronic small vessel ischemia and remote parietal infarct.  * CT scan images were reviewed online. I agree with the written report.   Echo 12/26/18: IMPRESSIONS   1. Moderate akinesis of the left ventricular, mid-apical anteroseptal wall and apical segment. 2. The left ventricle has mildly reduced systolic function, with an ejection fraction of 45-50%. The cavity size was normal. Left ventricular diastolic Doppler parameters are indeterminate. 3. The right ventricle has normal systolic function. The cavity was normal. There is no increase in right ventricular wall  thickness. 4. Mitral valve repair noted with annuloplasty ring. 5. The aortic valve is tricuspid. 6. The inferior vena cava was normal in size with <50% respiratory variability. 7. No intracardiac thrombi or masses were visualized.  Carotid dopplers 12/26/18:  Summary: Right Carotid: There is no evidence of stenosis in the right ICA. See technical comments listed above.  Left Carotid: There is no evidence of stenosis in the left ICA. See technical comments listed above.  Vertebrals: Bilateral vertebral arteries demonstrate antegrade flow. Subclavians: Normal flow hemodynamics were seen in bilateral subclavian arteries.     Assessment/Plan:  1.  Extensive small vessel disease, cerebrovascular disease  2.  Memory disturbance, dementia  3.  Transient confusional episodes  4.  Intermittent headache, left periorbital  The patient will be sent for blood work today.  She will be given a trial on Depakote to help boost appetite and for the headaches, the headaches seem to respond to caffeine which suggests a migrainous etiology, she has a history of migraine earlier in her life.  The patient is having transient episodes of confusion that could represent TIA or small stroke events or possibly seizures.  An EEG study will be done.  The patient remain on Aricept for now at 5 mg, will need to monitor the weight issue.  The patient will be placed on low-dose aspirin, 81 mg daily.  She will follow-up in 4 months.  Marlan Palau MD 04/02/2019 1:58 PM  [facility] [address] [place], [place] [address]  Phone [phone] Fax [phone]

## 2019-04-03 LAB — SEDIMENTATION RATE: Sed Rate: 2 mm/hr (ref 0–40)

## 2019-04-03 LAB — VITAMIN B12: Vitamin B-12: 318 pg/mL (ref 232–1245)

## 2019-04-06 ENCOUNTER — Other Ambulatory Visit: Payer: Medicare Other | Admitting: *Deleted

## 2019-04-06 ENCOUNTER — Other Ambulatory Visit: Payer: Self-pay

## 2019-04-06 ENCOUNTER — Telehealth: Payer: Self-pay

## 2019-04-06 DIAGNOSIS — E876 Hypokalemia: Secondary | ICD-10-CM

## 2019-04-06 LAB — BASIC METABOLIC PANEL
BUN/Creatinine Ratio: 13 (ref 12–28)
BUN: 18 mg/dL (ref 8–27)
CO2: 28 mmol/L (ref 20–29)
Calcium: 10.2 mg/dL (ref 8.7–10.3)
Chloride: 98 mmol/L (ref 96–106)
Creatinine, Ser: 1.34 mg/dL — ABNORMAL HIGH (ref 0.57–1.00)
GFR calc Af Amer: 43 mL/min/{1.73_m2} — ABNORMAL LOW (ref >59)
GFR calc non Af Amer: 38 mL/min/{1.73_m2} — ABNORMAL LOW (ref >59)
Glucose: 76 mg/dL (ref 65–99)
Potassium: 3.4 mmol/L — ABNORMAL LOW (ref 3.5–5.2)
Sodium: 141 mmol/L (ref 134–144)

## 2019-04-06 NOTE — Telephone Encounter (Signed)
Pt's daughter Tye [place] ok per dpr) returned my call and I advised of results. She verbalized understanding and had no questions.

## 2019-04-06 NOTE — Telephone Encounter (Signed)
I reached out to the pt and left a vm asking for her to call back so we could review results.

## 2019-04-06 NOTE — Telephone Encounter (Signed)
-----   Message from Kathrynn Ducking, MD sent at 04/03/2019  7:50 AM EDT -----  The blood work results are unremarkable. Please call the patient. ----- Message ----- From: Lavone Neri Lab Results In Sent: 04/03/2019   5:38 AM EDT To: Kathrynn Ducking, MD

## 2019-04-07 ENCOUNTER — Telehealth: Payer: Self-pay | Admitting: *Deleted

## 2019-04-07 DIAGNOSIS — E876 Hypokalemia: Secondary | ICD-10-CM

## 2019-04-07 NOTE — Telephone Encounter (Signed)
-----   Message from Imogene Burn, PA-C sent at 04/07/2019  7:48 AM EDT ----- Kidney function better. Potassium still low. Take Kdur 20 meq once daily. Recheck bmet in 2-3 weeks. thank

## 2019-04-07 NOTE — Telephone Encounter (Signed)
DPR ok to s/w pt's daughter Barnetta Chapel who has been informed of pt's lab results and recommendations. Pt's daughter is agreeable to plan of care to have pt take K+ 20 meq daily with bmet to be done 2-3 weeks (04/27/19). Pt's daughter thanked me for the call. The patient has been notified of the result and verbalized understanding. Patient notified of result.  Please refer to phone note from today for complete details.   Julaine Hua, [place] 04/07/2019 1:29 PM

## 2019-04-09 ENCOUNTER — Ambulatory Visit: Payer: Medicare Other | Admitting: Licensed Clinical Social Worker

## 2019-04-20 ENCOUNTER — Ambulatory Visit: Payer: Medicare Other | Admitting: Neurology

## 2019-04-20 ENCOUNTER — Other Ambulatory Visit: Payer: Self-pay

## 2019-04-20 ENCOUNTER — Telehealth: Payer: Self-pay | Admitting: Neurology

## 2019-04-20 DIAGNOSIS — R410 Disorientation, unspecified: Secondary | ICD-10-CM

## 2019-04-20 DIAGNOSIS — R404 Transient alteration of awareness: Secondary | ICD-10-CM

## 2019-04-20 NOTE — Telephone Encounter (Signed)
I called the daughter.  EEG study was done, shows background slowing but no epileptiform discharges, consistent with someone with dementia.  The daughter wants me to call tomorrow and talk with the patient while she is there about the results.  She is doing well on the low-dose Depakote will stay at 125 mg twice daily and not double the dose.   In the future, we may consider adding another medication such as Namenda, the patient is now off of Aricept.  She is eating better, she is not having further headaches.

## 2019-04-20 NOTE — Procedures (Signed)
     History: Amanda [name] is a 78 year old patient with a history of migraine headaches and a history of memory problems.  The patient is having episodes of transient confusion off and on, she is being evaluated for these events.  This is a routine EEG.  No skull defects are noted.  Medications include Calcitrol, Premarin, Aricept, Allegra, Flonase, Synthroid, and vitamin B-12.  EEG classification: Dysrhythmia grade 1 generalized  Description of the recording: The background rhythms of this recording consists of the well-modulated medium amplitude background activity of 6 to 7 Hz that is reactive to eye opening closure.  As the record progresses, photic stimulation is performed resulting in minimal but bilateral photic driving response.  Hyperventilation is also done without significant buildup of background rhythm activities or significant slowing seen.  At no time during the recording does there appear to be evidence of spike or spike wave discharges or evidence of focal slowing.  The background theta slowing is seen throughout the recording.  EKG monitor shows no evidence of cardiac rhythm abnormalities with a heart rate of 96.  Impression: This is an abnormal EEG recording secondary to symmetric background theta frequency slowing.  This is a nonspecific recording and can be seen in any process of results in an underlying dementia or a mild toxic or metabolic encephalopathy.  No epileptiform discharges were seen.

## 2019-04-21 MED ORDER — MEMANTINE HCL 5 MG PO TABS: ORAL_TABLET | ORAL | 0 refills | Status: DC

## 2019-04-21 NOTE — Telephone Encounter (Signed)
I called the patient, I went over the results of the EEG study.  The patient is off of Aricept, we will consider Namenda.  I will call in a prescription.

## 2019-04-22 ENCOUNTER — Encounter (HOSPITAL_COMMUNITY): Payer: Self-pay | Admitting: Nurse Practitioner

## 2019-04-22 ENCOUNTER — Ambulatory Visit (HOSPITAL_COMMUNITY)
Admission: RE | Admit: 2019-04-22 | Discharge: 2019-04-22 | Disposition: A | Discharge status: Home / Self Care | Payer: Medicare Other | Source: Ambulatory Visit | Attending: Nurse Practitioner | Admitting: Nurse Practitioner

## 2019-04-22 ENCOUNTER — Other Ambulatory Visit: Payer: Self-pay

## 2019-04-22 VITALS — BP 150/82 | HR 87 | Ht 67.0 in | Wt 125.8 lb

## 2019-04-22 DIAGNOSIS — N183 Chronic kidney disease, stage 3 unspecified: Secondary | ICD-10-CM | POA: Diagnosis not present

## 2019-04-22 DIAGNOSIS — Z881 Allergy status to other antibiotic agents status: Secondary | ICD-10-CM | POA: Diagnosis not present

## 2019-04-22 DIAGNOSIS — Z79890 Hormone replacement therapy (postmenopausal): Secondary | ICD-10-CM | POA: Diagnosis not present

## 2019-04-22 DIAGNOSIS — K589 Irritable bowel syndrome without diarrhea: Secondary | ICD-10-CM | POA: Insufficient documentation

## 2019-04-22 DIAGNOSIS — Z888 Allergy status to other drugs, medicaments and biological substances status: Secondary | ICD-10-CM | POA: Insufficient documentation

## 2019-04-22 DIAGNOSIS — I480 Paroxysmal atrial fibrillation: Secondary | ICD-10-CM

## 2019-04-22 DIAGNOSIS — Z91041 Radiographic dye allergy status: Secondary | ICD-10-CM | POA: Diagnosis not present

## 2019-04-22 DIAGNOSIS — E039 Hypothyroidism, unspecified: Secondary | ICD-10-CM | POA: Insufficient documentation

## 2019-04-22 DIAGNOSIS — Z823 Family history of stroke: Secondary | ICD-10-CM | POA: Diagnosis not present

## 2019-04-22 DIAGNOSIS — I129 Hypertensive chronic kidney disease with stage 1 through stage 4 chronic kidney disease, or unspecified chronic kidney disease: Secondary | ICD-10-CM | POA: Diagnosis not present

## 2019-04-22 DIAGNOSIS — E785 Hyperlipidemia, unspecified: Secondary | ICD-10-CM | POA: Insufficient documentation

## 2019-04-22 DIAGNOSIS — Z87891 Personal history of nicotine dependence: Secondary | ICD-10-CM | POA: Diagnosis not present

## 2019-04-22 DIAGNOSIS — Z8249 Family history of ischemic heart disease and other diseases of the circulatory system: Secondary | ICD-10-CM | POA: Diagnosis not present

## 2019-04-22 DIAGNOSIS — Z79899 Other long term (current) drug therapy: Secondary | ICD-10-CM | POA: Insufficient documentation

## 2019-04-22 DIAGNOSIS — Z8673 Personal history of transient ischemic attack (TIA), and cerebral infarction without residual deficits: Secondary | ICD-10-CM | POA: Insufficient documentation

## 2019-04-22 DIAGNOSIS — Z882 Allergy status to sulfonamides status: Secondary | ICD-10-CM | POA: Diagnosis not present

## 2019-04-22 DIAGNOSIS — Z90710 Acquired absence of both cervix and uterus: Secondary | ICD-10-CM | POA: Diagnosis not present

## 2019-04-22 MED ORDER — APIXABAN 2.5 MG PO TABS: 2.5000 mg | ORAL_TABLET | Freq: Two times a day (BID) | ORAL | 6 refills | Status: AC

## 2019-04-22 NOTE — Progress Notes (Signed)
Primary Care Physician: [name], MD Referring Physician: Dr. Florina Ou is a 78 y.o. female with a h/o one year f/u from Dr. Cornelius Moras for long term surveillance of maze procedure and afib. She has had no awareness of afib. No shortness of breath, no pedal edema. She is her with her daughter and has mild dementia. She was switched to eliquis 5 mg bid from coumadin back in May. However, she is now 78 as of 10/5 and her weight is less thtn 60 kg so she will be changed to 2.5 mg eliquis bid today. Last creatinine 9/28 was 1.34.   Today, she denies symptoms of palpitations, chest pain, shortness of breath, orthopnea, PND, lower extremity edema, dizziness, presyncope, syncope, or neurologic sequela. The patient is tolerating medications without difficulties and is otherwise without complaint today.   Past Medical History:  Diagnosis Date  . Allergic rhinitis   . Anemia   . Asymptomatic LV dysfunction    EF 45-50% echo 07/2017  . CKD (chronic kidney disease), stage III (HCC)    stage III  . DJD (degenerative joint disease)   . Glomerulonephritis    Dr [name]  . Hyperlipidemia   . Hyperparathyroidism (HCC)   . Hypertension   . Hypothyroidism   . IBS (irritable bowel syndrome)   . Interstitial cystitis   . Mitral regurgitation   . Mobitz II    a. s/p STJ dual chamber PPM   . MVP (mitral valve prolapse)    moderate posterior MVP with moderate MR and grade II diasotlic dysfunction  . PAC (premature atrial contraction)   . PAF (paroxysmal atrial fibrillation) (HCC)     note on pacer check. CHADS2VASC score is 4 now on Eliquis.  [name] PVC's (premature ventricular contractions)   . S/P minimally invasive maze operation for atrial fibrillation 04/16/2017   Complete bilateral atrial lesion set using cryothermy and bipolar radiofrequency ablation with clipping of LA appendage via right mini thoracotomy approach  . S/P minimally invasive mitral valve repair 04/16/2017   Complex  valvuloplasty including triangular resection of posterior leaflet, artificial Gore-tex neochords x6 and Sorin Memo 3D ring annuloplasty ([id number], size 32, serial # [id number])  . S/P placement of cardiac pacemaker   . Small vessel disease, cerebrovascular   . Stroke [facility])    a. old stroke seen on imaging.   Past Surgical History:  Procedure Laterality Date  . ABDOMINAL HYSTERECTOMY    . BREAST BIOPSY Right   . CHEST TUBE INSERTION Right 05/23/2017   Procedure: INSERTION PLEURAL DRAINAGE CATHETER;  Surgeon: Purcell Nails, MD;  Location: [facility] OR;  Service: Thoracic;  Laterality: Right;  possible pleurex catheter  . CLIPPING OF ATRIAL APPENDAGE  04/16/2017   Procedure: CLIPPING OF ATRIAL APPENDAGE using AtriCure Pro2 clip 45;  Surgeon: Purcell Nails, MD;  Location: [facility] OR;  Service: Open Heart Surgery;;  . CYSTOSTOMY W/ BLADDER BIOPSY    . EP IMPLANTABLE DEVICE N/A 01/07/2015   STJ dual chamber pacemaker implanted by Dr [name] for 2:1 heart block  . MINIMALLY INVASIVE MAZE PROCEDURE N/A 04/16/2017   Procedure: MINIMALLY INVASIVE MAZE PROCEDURE;  Surgeon: Purcell Nails, MD;  Location: [facility] OR;  Service: Open Heart Surgery;  Laterality: N/A;  . MITRAL VALVE REPAIR Right 04/16/2017   Procedure: MINIMALLY INVASIVE MITRAL VALVE REPAIR (MVR);  Surgeon: Purcell Nails, MD;  Location: [facility] OR;  Service: Open Heart Surgery;  Laterality: Right;  . MITRAL VALVE REPAIR Right 04/16/2017  Procedure: RE-EXPLORATION RIGHT THORACOTOMY FOR BLEEDING;  Surgeon: [name], MD;  Location: [place];  Service: Open Heart Surgery;  Laterality: Right;  . PERIPHERAL VASCULAR CATHETERIZATION N/A 02/03/2015   Procedure: Upper Extremity Venography;  Surgeon: Deboraha Sprang, MD;  Location: [place] CV LAB;  Service: Cardiovascular;  Laterality: N/A;  . REMOVAL OF PLEURAL DRAINAGE CATHETER Right 09/03/2017   Procedure: REMOVAL OF PLEURAL DRAINAGE CATHETER;  Surgeon: [name], MD;  Location: [place];  Service: Thoracic;   Laterality: Right;  . RIGHT/LEFT HEART CATH AND CORONARY ANGIOGRAPHY N/A 03/26/2017   Procedure: RIGHT/LEFT HEART CATH AND CORONARY ANGIOGRAPHY;  Surgeon: [name], MD;  Location: [place] CV LAB;  Service: Cardiovascular;  Laterality: N/A;  . TEE WITHOUT CARDIOVERSION N/A 03/18/2017   Procedure: TRANSESOPHAGEAL ECHOCARDIOGRAM (TEE);  Surgeon: Skeet Latch, MD;  Location: [place];  Service: Cardiovascular;  Laterality: N/A;  . TEE WITHOUT CARDIOVERSION N/A 04/16/2017   Procedure: TRANSESOPHAGEAL ECHOCARDIOGRAM (TEE);  Surgeon: [name], MD;  Location: [place];  Service: Open Heart Surgery;  Laterality: N/A;  . TONSILLECTOMY      Current Outpatient Medications  Medication Sig Dispense Refill  . acetaminophen (TYLENOL) 500 MG tablet Take 500 mg by mouth as needed for moderate pain.     . calcitRIOL (ROCALTROL) 0.25 MCG capsule Take 0.25 mcg by mouth every other day. In the morning     . conjugated estrogens (PREMARIN) vaginal cream Place 1 Applicatorful vaginally at bedtime.     . divalproex (DEPAKOTE) 125 MG DR tablet One capsule twice a day for 2 weeks, then take 2 capsules twice a day 120 tablet 3  . fexofenadine (ALLEGRA) 180 MG tablet Take 180 mg by mouth daily.    . fluticasone (FLONASE) 50 MCG/ACT nasal spray Place 2 sprays into both nostrils daily.     [name] levothyroxine (SYNTHROID, LEVOTHROID) 75 MCG tablet Take 75 mcg by mouth daily.    . vitamin B-12 (CYANOCOBALAMIN) 1000 MCG tablet Take 1,000 mcg by mouth once a week.    [name] amoxicillin (AMOXIL) 875 MG tablet     . divalproex (DEPAKOTE SPRINKLE) 125 MG capsule 2 (two) times daily. Taking one tablet twice daily    . memantine (NAMENDA) 5 MG tablet Take 1 tablet daily for one week, then take 1 tablet twice daily for one week, then take 1 tablet in the morning and 2 in the evening for one week, then take 2 tablets twice daily (Patient not taking: Reported on 04/22/2019) 70 tablet 0   No current facility-administered  medications for this encounter.     Allergies  Allergen Reactions  . Pepcid [Famotidine] Anaphylaxis, Swelling and Other (See Comments)    Causes "Throat Swelling"   . Allopurinol Other (See Comments)    Caused headaches  . Amlodipine Other (See Comments)    Results in PEDAL EDEMA   . Atorvastatin Other (See Comments)    Causes her leg muscles to ache  . Contrast Media [Iodinated Diagnostic Agents] Hives    IVP dye per patient  . Cephalexin Itching and Rash  . Ciprofloxacin Itching and Rash  . Colchicine Rash  . Erythromycin Itching and Rash  . Prilosec [Omeprazole] Nausea Only  . Sulfonamide Derivatives Itching and Rash  . Tetracycline Itching and Rash    Social History   Socioeconomic History  . Marital status: Married    Spouse name: Not on file  . Number of children: 2  . Years of education: Not on file  .  Highest education level: Associate degree: academic program  Occupational History  . Occupation: retired  Engineer, productionocial Needs  . Financial resource strain: Not on file  . Food insecurity    Worry: Not on file    Inability: Not on file  . Transportation needs    Medical: Not on file    Non-medical: Not on file  Tobacco Use  . Smoking status: Former Games developermoker  . Smokeless tobacco: Never Used  . Tobacco comment: quit 1965  Substance and Sexual Activity  . Alcohol use: No  . Drug use: No  . Sexual activity: Not on file  Lifestyle  . Physical activity    Days per week: Not on file    Minutes per session: Not on file  . Stress: Not on file  Relationships  . Social Musicianconnections    Talks on phone: Not on file    Gets together: Not on file    Attends religious service: Not on file    Active member of club or organization: Not on file    Attends meetings of clubs or organizations: Not on file    Relationship status: Not on file  . Intimate partner violence    Fear of current or ex partner: Not on file    Emotionally abused: Not on file    Physically abused: Not on  file    Forced sexual activity: Not on file  Other Topics Concern  . Not on file  Social History Narrative   04/02/19 lives alone, family asists   Patient drinks 1-2 cups of caffeine daily.   Patient is right handed.     Family History  Problem Relation Age of Onset  . Heart disease Mother   . Stroke Mother   . Hypertension Mother   . Heart disease Father   . Heart attack Father   . Brain cancer Brother   . Heart disease Brother   . Melanoma Brother   . Heart attack Brother   . Hypertension Brother   . Ovarian cancer Sister   . Hypertension Sister   . Rheum arthritis Sister   . Colon cancer Neg Hx     ROS- All systems are reviewed and negative except as per the HPI above  Physical Exam: Vitals:   04/22/19 1416  BP: (!) 150/82  Pulse: 87  Weight: 57.1 kg  Height: 5\' 7"  (1.702 m)   Wt Readings from Last 3 Encounters:  04/22/19 57.1 kg  04/02/19 57.6 kg  02/23/19 59 kg    Labs: Lab Results  Component Value Date   NA 141 04/06/2019   K 3.4 (L) 04/06/2019   CL 98 04/06/2019   CO2 28 04/06/2019   GLUCOSE 76 04/06/2019   BUN 18 04/06/2019   CREATININE 1.34 (H) 04/06/2019   CALCIUM 10.2 04/06/2019   PHOS 2.9 04/23/2017   MG 2.1 12/26/2018   Lab Results  Component Value Date   INR 1.2 (A) 11/21/2018   Lab Results  Component Value Date   CHOL 209 (H) 12/27/2018   HDL 57 12/27/2018   LDLCALC 134 (H) 12/27/2018   TRIG 89 12/27/2018     GEN- The patient is well appearing, alert and oriented x 3 today.   Head- normocephalic, atraumatic Eyes-  Sclera clear, conjunctiva pink Ears- hearing intact Oropharynx- clear Neck- supple, no JVP Lymph- no cervical lymphadenopathy Lungs- Clear to ausculation bilaterally, normal work of breathing Heart- Regular rate and rhythm, no murmurs, rubs or gallops, PMI not laterally displaced GI- soft,  NT, ND, + BS Extremities- no clubbing, cyanosis, or edema MS- no significant deformity or atrophy Skin- no rash or lesion  Psych- euthymic mood, full affect Neuro- strength and sensation are intact  EKG-a sensed/v paced at 87 bpm, pr int 162, qrs int 164 ms qtc 534 ms    Assessment and Plan: 1. Afib  S/p Maze procedure and MV repair with left atrial clipping 04/15/2017  No awareness of afib and per last  paceart report pt shows less than 1% burden   2. CHA2DS2VASc of at least 6 Pt is now 78 and weight is less than 60 kg Will reduce to 2.5 mg eliquis bid  Daughter pours  her meds and is aware to make change   3. HTN Stable   F/u in one year   Elvina Sidle. [facility]: [place] [address] W [address] [place], [place] [address] [phone]

## 2019-04-22 NOTE — Telephone Encounter (Signed)
I have faxed the rx for Namenda to [facility]. Confirmation has been received.

## 2019-04-23 NOTE — Addendum Note (Signed)
Encounter addended by: Juluis Mire, RN on: 04/23/2019 9:24 AM  Actions taken: Flowsheet accepted

## 2019-04-27 ENCOUNTER — Other Ambulatory Visit: Payer: Self-pay

## 2019-04-27 ENCOUNTER — Other Ambulatory Visit: Payer: Medicare Other | Admitting: *Deleted

## 2019-04-27 DIAGNOSIS — E876 Hypokalemia: Secondary | ICD-10-CM

## 2019-04-27 LAB — BASIC METABOLIC PANEL
BUN/Creatinine Ratio: 12 (ref 12–28)
BUN: 16 mg/dL (ref 8–27)
CO2: 29 mmol/L (ref 20–29)
Calcium: 9.8 mg/dL (ref 8.7–10.3)
Chloride: 102 mmol/L (ref 96–106)
Creatinine, Ser: 1.3 mg/dL — ABNORMAL HIGH (ref 0.57–1.00)
GFR calc Af Amer: 45 mL/min/{1.73_m2} — ABNORMAL LOW (ref >59)
GFR calc non Af Amer: 39 mL/min/{1.73_m2} — ABNORMAL LOW (ref >59)
Glucose: 106 mg/dL — ABNORMAL HIGH (ref 65–99)
Potassium: 3.3 mmol/L — ABNORMAL LOW (ref 3.5–5.2)
Sodium: 145 mmol/L — ABNORMAL HIGH (ref 134–144)

## 2019-04-28 ENCOUNTER — Other Ambulatory Visit: Payer: Self-pay

## 2019-04-28 DIAGNOSIS — E876 Hypokalemia: Secondary | ICD-10-CM

## 2019-04-28 MED ORDER — POTASSIUM CHLORIDE CRYS ER 20 MEQ PO TBCR: 40.0000 meq | EXTENDED_RELEASE_TABLET | Freq: Every day | ORAL | 3 refills | Status: DC

## 2019-05-04 ENCOUNTER — Telehealth: Payer: Self-pay | Admitting: Neurology

## 2019-05-04 NOTE — Telephone Encounter (Signed)
I called the daughter.  The patient's had significant dizziness, decreased appetite on Namenda, she will stop this now.  There is some question about confusion as well, we will stop Depakote.  They will call me in several days to see how she is doing.

## 2019-05-04 NOTE — Addendum Note (Signed)
Addended by: Kathrynn Ducking on: 05/04/2019 05:09 PM   Modules accepted: Orders

## 2019-05-04 NOTE — Telephone Encounter (Signed)
Pts daughter Jenny Reichmann called in and stated she has been having severe side effects and it really scary, they are unable to leave her alone. The meds are divalproex (DEPAKOTE) 125 MG DR tablet and memantine (NAMENDA) 5 MG tablet

## 2019-05-04 NOTE — Telephone Encounter (Signed)
I reached out to the pt's daughter Jenny Reichmann, ok per dpr). She reports the pt has significantly declined since her last visit. Daughter reports, increased confusion, increased dizziness, 1 fall ( no injury), pt is unable to feed her self and has not been eating well.  Pt's daughter feels strongly this sudden change is related to the depakote and namenda the pt is taking. Daughter would like to further discuss with the MD( best call back # is  [phone])

## 2019-05-07 ENCOUNTER — Inpatient Hospital Stay (HOSPITAL_COMMUNITY)
Admission: EM | Admit: 2019-05-07 | Discharge: 2019-05-10 | DRG: 864 | Disposition: A | Discharge status: Home Health | Payer: Medicare Other | Attending: Family Medicine | Admitting: Family Medicine

## 2019-05-07 DIAGNOSIS — Z8673 Personal history of transient ischemic attack (TIA), and cerebral infarction without residual deficits: Secondary | ICD-10-CM

## 2019-05-07 DIAGNOSIS — I129 Hypertensive chronic kidney disease with stage 1 through stage 4 chronic kidney disease, or unspecified chronic kidney disease: Secondary | ICD-10-CM | POA: Diagnosis present

## 2019-05-07 DIAGNOSIS — A419 Sepsis, unspecified organism: Secondary | ICD-10-CM

## 2019-05-07 DIAGNOSIS — Z79890 Hormone replacement therapy (postmenopausal): Secondary | ICD-10-CM

## 2019-05-07 DIAGNOSIS — E872 Acidosis: Secondary | ICD-10-CM | POA: Diagnosis present

## 2019-05-07 DIAGNOSIS — Z808 Family history of malignant neoplasm of other organs or systems: Secondary | ICD-10-CM

## 2019-05-07 DIAGNOSIS — Z888 Allergy status to other drugs, medicaments and biological substances status: Secondary | ICD-10-CM

## 2019-05-07 DIAGNOSIS — I480 Paroxysmal atrial fibrillation: Secondary | ICD-10-CM | POA: Diagnosis present

## 2019-05-07 DIAGNOSIS — I4510 Unspecified right bundle-branch block: Secondary | ICD-10-CM | POA: Diagnosis present

## 2019-05-07 DIAGNOSIS — R509 Fever, unspecified: Secondary | ICD-10-CM | POA: Diagnosis not present

## 2019-05-07 DIAGNOSIS — Z881 Allergy status to other antibiotic agents status: Secondary | ICD-10-CM

## 2019-05-07 DIAGNOSIS — E039 Hypothyroidism, unspecified: Secondary | ICD-10-CM | POA: Diagnosis present

## 2019-05-07 DIAGNOSIS — I441 Atrioventricular block, second degree: Secondary | ICD-10-CM | POA: Diagnosis present

## 2019-05-07 DIAGNOSIS — Z7901 Long term (current) use of anticoagulants: Secondary | ICD-10-CM

## 2019-05-07 DIAGNOSIS — Z8249 Family history of ischemic heart disease and other diseases of the circulatory system: Secondary | ICD-10-CM

## 2019-05-07 DIAGNOSIS — E876 Hypokalemia: Secondary | ICD-10-CM | POA: Diagnosis not present

## 2019-05-07 DIAGNOSIS — R6510 Systemic inflammatory response syndrome (SIRS) of non-infectious origin without acute organ dysfunction: Secondary | ICD-10-CM | POA: Diagnosis present

## 2019-05-07 DIAGNOSIS — Z823 Family history of stroke: Secondary | ICD-10-CM

## 2019-05-07 DIAGNOSIS — Z882 Allergy status to sulfonamides status: Secondary | ICD-10-CM

## 2019-05-07 DIAGNOSIS — B999 Unspecified infectious disease: Secondary | ICD-10-CM | POA: Diagnosis present

## 2019-05-07 DIAGNOSIS — G9340 Encephalopathy, unspecified: Secondary | ICD-10-CM

## 2019-05-07 DIAGNOSIS — N1832 Chronic kidney disease, stage 3b: Secondary | ICD-10-CM | POA: Diagnosis present

## 2019-05-07 DIAGNOSIS — Z681 Body mass index (BMI) 19 or less, adult: Secondary | ICD-10-CM

## 2019-05-07 DIAGNOSIS — N179 Acute kidney failure, unspecified: Secondary | ICD-10-CM | POA: Diagnosis not present

## 2019-05-07 DIAGNOSIS — Z20828 Contact with and (suspected) exposure to other viral communicable diseases: Secondary | ICD-10-CM | POA: Diagnosis present

## 2019-05-07 DIAGNOSIS — Z950 Presence of cardiac pacemaker: Secondary | ICD-10-CM

## 2019-05-07 DIAGNOSIS — E785 Hyperlipidemia, unspecified: Secondary | ICD-10-CM | POA: Diagnosis present

## 2019-05-07 DIAGNOSIS — G9341 Metabolic encephalopathy: Secondary | ICD-10-CM | POA: Diagnosis present

## 2019-05-07 DIAGNOSIS — N183 Chronic kidney disease, stage 3 unspecified: Secondary | ICD-10-CM | POA: Diagnosis present

## 2019-05-07 DIAGNOSIS — R6520 Severe sepsis without septic shock: Principal | ICD-10-CM

## 2019-05-07 DIAGNOSIS — Z8261 Family history of arthritis: Secondary | ICD-10-CM

## 2019-05-07 DIAGNOSIS — F0390 Unspecified dementia without behavioral disturbance: Secondary | ICD-10-CM | POA: Diagnosis present

## 2019-05-08 ENCOUNTER — Encounter (HOSPITAL_COMMUNITY): Payer: Self-pay | Admitting: Radiology

## 2019-05-08 ENCOUNTER — Emergency Department (HOSPITAL_COMMUNITY): Payer: Medicare Other

## 2019-05-08 ENCOUNTER — Other Ambulatory Visit: Payer: Self-pay

## 2019-05-08 DIAGNOSIS — Z7901 Long term (current) use of anticoagulants: Secondary | ICD-10-CM | POA: Diagnosis not present

## 2019-05-08 DIAGNOSIS — E872 Acidosis: Secondary | ICD-10-CM | POA: Diagnosis present

## 2019-05-08 DIAGNOSIS — Z681 Body mass index (BMI) 19 or less, adult: Secondary | ICD-10-CM | POA: Diagnosis not present

## 2019-05-08 DIAGNOSIS — F0390 Unspecified dementia without behavioral disturbance: Secondary | ICD-10-CM

## 2019-05-08 DIAGNOSIS — Z888 Allergy status to other drugs, medicaments and biological substances status: Secondary | ICD-10-CM | POA: Diagnosis not present

## 2019-05-08 DIAGNOSIS — E039 Hypothyroidism, unspecified: Secondary | ICD-10-CM

## 2019-05-08 DIAGNOSIS — G9340 Encephalopathy, unspecified: Secondary | ICD-10-CM | POA: Diagnosis not present

## 2019-05-08 DIAGNOSIS — G9341 Metabolic encephalopathy: Secondary | ICD-10-CM | POA: Diagnosis present

## 2019-05-08 DIAGNOSIS — Z882 Allergy status to sulfonamides status: Secondary | ICD-10-CM | POA: Diagnosis not present

## 2019-05-08 DIAGNOSIS — N1832 Chronic kidney disease, stage 3b: Secondary | ICD-10-CM | POA: Diagnosis present

## 2019-05-08 DIAGNOSIS — Z8261 Family history of arthritis: Secondary | ICD-10-CM | POA: Diagnosis not present

## 2019-05-08 DIAGNOSIS — R6510 Systemic inflammatory response syndrome (SIRS) of non-infectious origin without acute organ dysfunction: Secondary | ICD-10-CM | POA: Diagnosis not present

## 2019-05-08 DIAGNOSIS — Z823 Family history of stroke: Secondary | ICD-10-CM | POA: Diagnosis not present

## 2019-05-08 DIAGNOSIS — E785 Hyperlipidemia, unspecified: Secondary | ICD-10-CM | POA: Diagnosis present

## 2019-05-08 DIAGNOSIS — I480 Paroxysmal atrial fibrillation: Secondary | ICD-10-CM

## 2019-05-08 DIAGNOSIS — I4510 Unspecified right bundle-branch block: Secondary | ICD-10-CM | POA: Diagnosis present

## 2019-05-08 DIAGNOSIS — Z881 Allergy status to other antibiotic agents status: Secondary | ICD-10-CM | POA: Diagnosis not present

## 2019-05-08 DIAGNOSIS — R509 Fever, unspecified: Secondary | ICD-10-CM | POA: Diagnosis present

## 2019-05-08 DIAGNOSIS — B999 Unspecified infectious disease: Secondary | ICD-10-CM | POA: Diagnosis present

## 2019-05-08 DIAGNOSIS — Z8673 Personal history of transient ischemic attack (TIA), and cerebral infarction without residual deficits: Secondary | ICD-10-CM | POA: Diagnosis not present

## 2019-05-08 DIAGNOSIS — E876 Hypokalemia: Secondary | ICD-10-CM | POA: Diagnosis not present

## 2019-05-08 DIAGNOSIS — Z20828 Contact with and (suspected) exposure to other viral communicable diseases: Secondary | ICD-10-CM | POA: Diagnosis present

## 2019-05-08 DIAGNOSIS — Z8249 Family history of ischemic heart disease and other diseases of the circulatory system: Secondary | ICD-10-CM | POA: Diagnosis not present

## 2019-05-08 DIAGNOSIS — A419 Sepsis, unspecified organism: Secondary | ICD-10-CM | POA: Diagnosis present

## 2019-05-08 DIAGNOSIS — I129 Hypertensive chronic kidney disease with stage 1 through stage 4 chronic kidney disease, or unspecified chronic kidney disease: Secondary | ICD-10-CM | POA: Diagnosis present

## 2019-05-08 DIAGNOSIS — I441 Atrioventricular block, second degree: Secondary | ICD-10-CM | POA: Diagnosis present

## 2019-05-08 DIAGNOSIS — N179 Acute kidney failure, unspecified: Secondary | ICD-10-CM | POA: Diagnosis not present

## 2019-05-08 LAB — URINALYSIS, ROUTINE W REFLEX MICROSCOPIC
Bacteria, UA: NONE SEEN
Bilirubin Urine: NEGATIVE
Glucose, UA: 50 mg/dL — AB
Ketones, ur: 20 mg/dL — AB
Leukocytes,Ua: NEGATIVE
Nitrite: NEGATIVE
Protein, ur: NEGATIVE mg/dL
Specific Gravity, Urine: 1.006 (ref 1.005–1.030)
pH: 8 (ref 5.0–8.0)

## 2019-05-08 LAB — CBC WITH DIFFERENTIAL/PLATELET
Abs Immature Granulocytes: 0.08 10*3/uL — ABNORMAL HIGH (ref 0.00–0.07)
Basophils Absolute: 0.1 10*3/uL (ref 0.0–0.1)
Basophils Relative: 0 %
Eosinophils Absolute: 0 10*3/uL (ref 0.0–0.5)
Eosinophils Relative: 0 %
HCT: 41.5 % (ref 36.0–46.0)
Hemoglobin: 13.3 g/dL (ref 12.0–15.0)
Immature Granulocytes: 1 %
Lymphocytes Relative: 9 %
Lymphs Abs: 1.4 10*3/uL (ref 0.7–4.0)
MCH: 31.1 pg (ref 26.0–34.0)
MCHC: 32 g/dL (ref 30.0–36.0)
MCV: 97 fL (ref 80.0–100.0)
Monocytes Absolute: 0.7 10*3/uL (ref 0.1–1.0)
Monocytes Relative: 5 %
Neutro Abs: 12.6 10*3/uL — ABNORMAL HIGH (ref 1.7–7.7)
Neutrophils Relative %: 85 %
Platelets: 255 10*3/uL (ref 150–400)
RBC: 4.28 MIL/uL (ref 3.87–5.11)
RDW: 12.7 % (ref 11.5–15.5)
WBC: 14.8 10*3/uL — ABNORMAL HIGH (ref 4.0–10.5)
nRBC: 0 % (ref 0.0–0.2)

## 2019-05-08 LAB — COMPREHENSIVE METABOLIC PANEL
ALT: 29 U/L (ref 0–44)
AST: 42 U/L — ABNORMAL HIGH (ref 15–41)
Albumin: 4 g/dL (ref 3.5–5.0)
Alkaline Phosphatase: 70 U/L (ref 38–126)
Anion gap: 14 (ref 5–15)
BUN: 19 mg/dL (ref 8–23)
CO2: 25 mmol/L (ref 22–32)
Calcium: 10 mg/dL (ref 8.9–10.3)
Chloride: 99 mmol/L (ref 98–111)
Creatinine, Ser: 1.46 mg/dL — ABNORMAL HIGH (ref 0.44–1.00)
GFR calc Af Amer: 39 mL/min — ABNORMAL LOW (ref >60)
GFR calc non Af Amer: 34 mL/min — ABNORMAL LOW (ref >60)
Glucose, Bld: 189 mg/dL — ABNORMAL HIGH (ref 70–99)
Potassium: 3.7 mmol/L (ref 3.5–5.1)
Sodium: 138 mmol/L (ref 135–145)
Total Bilirubin: 1 mg/dL (ref 0.3–1.2)
Total Protein: 6.8 g/dL (ref 6.5–8.1)

## 2019-05-08 LAB — URINE CULTURE: Culture: NO GROWTH

## 2019-05-08 LAB — APTT: aPTT: 32 seconds (ref 24–36)

## 2019-05-08 LAB — LACTIC ACID, PLASMA
Lactic Acid, Venous: 2.6 mmol/L (ref 0.5–1.9)
Lactic Acid, Venous: 2.9 mmol/L (ref 0.5–1.9)
Lactic Acid, Venous: 2.5 mmol/L (ref 0.5–1.9)
Lactic Acid, Venous: 1.9 mmol/L (ref 0.5–1.9)

## 2019-05-08 LAB — PROTIME-INR
INR: 1.2 (ref 0.8–1.2)
Prothrombin Time: 15.3 seconds — ABNORMAL HIGH (ref 11.4–15.2)

## 2019-05-08 LAB — PROCALCITONIN: Procalcitonin: <0.1 ng/mL

## 2019-05-08 LAB — CBG MONITORING, ED: Glucose-Capillary: 169 mg/dL — ABNORMAL HIGH (ref 70–99)

## 2019-05-08 LAB — SARS CORONAVIRUS 2 (TAT 6-24 HRS): SARS Coronavirus 2: NEGATIVE

## 2019-05-08 LAB — GLUCOSE, CAPILLARY: Glucose-Capillary: 117 mg/dL — ABNORMAL HIGH (ref 70–99)

## 2019-05-08 MED ORDER — SODIUM CHLORIDE 0.9 % IV SOLN: 2.0000 g | INTRAVENOUS | Status: DC

## 2019-05-08 MED ORDER — LEVOTHYROXINE SODIUM 75 MCG PO TABS
75.0000 ug | ORAL_TABLET | Freq: Every day | ORAL | Status: DC
  Administered 2019-05-09 – 2019-05-10 (×2): 75 ug via ORAL
  Filled 2019-05-08 (×2): qty 1

## 2019-05-08 MED ORDER — SODIUM CHLORIDE 0.9% FLUSH: 3.0000 mL | INTRAVENOUS | Status: DC | PRN

## 2019-05-08 MED ORDER — PANTOPRAZOLE SODIUM 40 MG IV SOLR
40.0000 mg | INTRAVENOUS | Status: DC
  Administered 2019-05-08 – 2019-05-09 (×2): 40 mg via INTRAVENOUS
  Filled 2019-05-08 (×2): qty 40

## 2019-05-08 MED ORDER — SODIUM CHLORIDE 0.9 % IV SOLN: 250.0000 mL | INTRAVENOUS | Status: DC | PRN

## 2019-05-08 MED ORDER — SODIUM CHLORIDE 0.9 % IV BOLUS
1000.0000 mL | Freq: Once | INTRAVENOUS | Status: AC
  Administered 2019-05-08: 1000 mL via INTRAVENOUS

## 2019-05-08 MED ORDER — ONDANSETRON HCL 4 MG PO TABS: 4.0000 mg | ORAL_TABLET | Freq: Four times a day (QID) | ORAL | Status: DC | PRN

## 2019-05-08 MED ORDER — ACETAMINOPHEN 650 MG RE SUPP: 650.0000 mg | Freq: Four times a day (QID) | RECTAL | Status: DC | PRN

## 2019-05-08 MED ORDER — ACETAMINOPHEN 325 MG PO TABS
650.0000 mg | ORAL_TABLET | Freq: Four times a day (QID) | ORAL | Status: DC | PRN
  Administered 2019-05-09 (×2): 650 mg via ORAL
  Filled 2019-05-08 (×2): qty 2

## 2019-05-08 MED ORDER — SODIUM CHLORIDE 0.9% FLUSH
3.0000 mL | Freq: Two times a day (BID) | INTRAVENOUS | Status: DC
  Administered 2019-05-08 – 2019-05-10 (×5): 3 mL via INTRAVENOUS

## 2019-05-08 MED ORDER — METRONIDAZOLE IN NACL 5-0.79 MG/ML-% IV SOLN
500.0000 mg | Freq: Three times a day (TID) | INTRAVENOUS | Status: DC
  Administered 2019-05-08: 500 mg via INTRAVENOUS
  Filled 2019-05-08: qty 100

## 2019-05-08 MED ORDER — SODIUM CHLORIDE 0.9% FLUSH
3.0000 mL | Freq: Two times a day (BID) | INTRAVENOUS | Status: DC
  Administered 2019-05-08 – 2019-05-10 (×4): 3 mL via INTRAVENOUS

## 2019-05-08 MED ORDER — ONDANSETRON HCL 4 MG/2ML IJ SOLN: 4.0000 mg | Freq: Four times a day (QID) | INTRAMUSCULAR | Status: DC | PRN

## 2019-05-08 MED ORDER — METRONIDAZOLE IN NACL 5-0.79 MG/ML-% IV SOLN
500.0000 mg | Freq: Once | INTRAVENOUS | Status: AC
  Administered 2019-05-08: 500 mg via INTRAVENOUS
  Filled 2019-05-08: qty 100

## 2019-05-08 MED ORDER — ACETAMINOPHEN 650 MG RE SUPP
650.0000 mg | Freq: Once | RECTAL | Status: AC
  Administered 2019-05-08: 650 mg via RECTAL
  Filled 2019-05-08: qty 1

## 2019-05-08 MED ORDER — SODIUM CHLORIDE 0.9 % IV SOLN: 1.0000 g | INTRAVENOUS | Status: DC

## 2019-05-08 MED ORDER — PIPERACILLIN-TAZOBACTAM 3.375 G IVPB
3.3750 g | Freq: Three times a day (TID) | INTRAVENOUS | Status: DC
  Administered 2019-05-08 – 2019-05-10 (×6): 3.375 g via INTRAVENOUS
  Filled 2019-05-08 (×8): qty 50

## 2019-05-08 MED ORDER — SODIUM CHLORIDE 0.9 % IV SOLN: 2.0000 g | Freq: Once | INTRAVENOUS | Status: DC

## 2019-05-08 MED ORDER — SODIUM CHLORIDE 0.9 % IV BOLUS
500.0000 mL | Freq: Once | INTRAVENOUS | Status: AC
  Administered 2019-05-08: 500 mL via INTRAVENOUS

## 2019-05-08 MED ORDER — VANCOMYCIN HCL IN DEXTROSE 1-5 GM/200ML-% IV SOLN
1000.0000 mg | Freq: Once | INTRAVENOUS | Status: AC
  Administered 2019-05-08: 1000 mg via INTRAVENOUS
  Filled 2019-05-08: qty 200

## 2019-05-08 MED ORDER — VANCOMYCIN HCL IN DEXTROSE 1-5 GM/200ML-% IV SOLN
1000.0000 mg | INTRAVENOUS | Status: DC
  Filled 2019-05-08: qty 200

## 2019-05-08 MED ORDER — ONDANSETRON HCL 4 MG/2ML IJ SOLN
4.0000 mg | Freq: Once | INTRAMUSCULAR | Status: AC
  Administered 2019-05-08: 4 mg via INTRAVENOUS
  Filled 2019-05-08: qty 2

## 2019-05-08 MED ORDER — APIXABAN 2.5 MG PO TABS
2.5000 mg | ORAL_TABLET | Freq: Two times a day (BID) | ORAL | Status: DC
  Administered 2019-05-08 – 2019-05-10 (×5): 2.5 mg via ORAL
  Filled 2019-05-08 (×5): qty 1

## 2019-05-08 MED ORDER — SODIUM CHLORIDE 0.9 % IV SOLN
2.0000 g | Freq: Once | INTRAVENOUS | Status: AC
  Administered 2019-05-08: 2 g via INTRAVENOUS
  Filled 2019-05-08: qty 2

## 2019-05-08 NOTE — ED Triage Notes (Signed)
Pt arrived with EMS from home where she lives with children. Per family pt was found sitting in bathroom floor after being assisted to bathroom, unsure if pt fell. Pt has had vomiting and had witnessed episode of emesis when EMS arrived. Pt was altered at scene but not completely unresponsive. Pt acknowledges you when you say her name but is not following commands or answering questions. Per family pt has been progressively getting worse as far as mental status.

## 2019-05-08 NOTE — H&P (Signed)
History and Physical    TKAI LARGE RDE:[id number] DOB: 1940/12/25 DOA: 05/07/2019  PCP: [name], MD   Patient coming from: Home   Chief Complaint: Confused, lethargic, N/V/D   HPI: Amanda [name] is a 78 y.o. female with medical history significant for dementia, transient confusional episodes, CVA, paroxysmal atrial fibrillation on Eliquis, hypothyroidism, and chronic kidney disease stage III, now presenting to the emergency department for evaluation of increased confusion and lethargy.  Patient's daughter is at the bedside and assists with the history.  She noted that the patient was more confused than usual over the past week or so, suspected she may have a UTI, dropped a urine sample off at the lab but had not gotten the results yet.  Family helped the patient to the restroom last night but shortly after this found her completely disoriented and sitting on the bathroom floor.  She seemed lethargic and much more confused than she had been earlier in the night.  There was one episode each of vomiting and loose stool.  Patient has not complained of anything recently.  ED Course: Upon arrival to the ED, patient is found to be febrile to 39.6 C, saturating mid 90s on room air, tachycardic to the 120s, and with stable blood pressure.  EKG features a ventricularly paced rhythm.  Chest x-ray is negative for acute cardiopulmonary disease.  Noncontrast head CT is negative for acute intracranial abnormality.  CT of the abdomen and pelvis is negative for acute findings.  Chemistry panel is notable for glucose 189 and creatinine 1.46, up from an apparent baseline of 1.3.  CBC features a leukocytosis to 14,800.  Lactic acid is elevated to 2.6.  Blood and urine cultures were collected and the patient was treated with Tylenol, 1 L of normal saline, cefepime, vancomycin, and Flagyl.  COVID-19 testing is in process.  Review of Systems:  All other systems reviewed and apart from HPI, are  negative.  Past Medical History:  Diagnosis Date  . Allergic rhinitis   . Anemia   . Asymptomatic LV dysfunction    EF 45-50% echo 07/2017  . CKD (chronic kidney disease), stage III    stage III  . DJD (degenerative joint disease)   . Glomerulonephritis    Dr [name]  . Hyperlipidemia   . Hyperparathyroidism (HCC)   . Hypertension   . Hypothyroidism   . IBS (irritable bowel syndrome)   . Interstitial cystitis   . Mitral regurgitation   . Mobitz II    a. s/p STJ dual chamber PPM   . MVP (mitral valve prolapse)    moderate posterior MVP with moderate MR and grade II diasotlic dysfunction  . PAC (premature atrial contraction)   . PAF (paroxysmal atrial fibrillation) (HCC)     note on pacer check. CHADS2VASC score is 4 now on Eliquis.  [name] PVC's (premature ventricular contractions)   . S/P minimally invasive maze operation for atrial fibrillation 04/16/2017   Complete bilateral atrial lesion set using cryothermy and bipolar radiofrequency ablation with clipping of LA appendage via right mini thoracotomy approach  . S/P minimally invasive mitral valve repair 04/16/2017   Complex valvuloplasty including triangular resection of posterior leaflet, artificial Gore-tex neochords x6 and Sorin Memo 3D ring annuloplasty ([id number], size 32, serial # [id number])  . S/P placement of cardiac pacemaker   . Small vessel disease, cerebrovascular   . Stroke [facility])    a. old stroke seen on imaging.    Past Surgical History:  Procedure Laterality  Date  . ABDOMINAL HYSTERECTOMY    . BREAST BIOPSY Right   . CHEST TUBE INSERTION Right 05/23/2017   Procedure: INSERTION PLEURAL DRAINAGE CATHETER;  Surgeon: Purcell Nailswen, Clarence H, MD;  [facility];  Service: Thoracic;  Laterality: Right;  possible pleurex catheter  . CLIPPING OF ATRIAL APPENDAGE  04/16/2017   Procedure: CLIPPING OF ATRIAL APPENDAGE using AtriCure Pro2 clip 45;  Surgeon: Purcell Nailswen, Clarence H, MD;  Location: [facility] OR;  Service: Open Heart Surgery;;  .  CYSTOSTOMY W/ BLADDER BIOPSY    . EP IMPLANTABLE DEVICE N/A 01/07/2015   STJ dual chamber pacemaker implanted by Dr Ladona Ridgelaylor for 2:1 heart block  . MINIMALLY INVASIVE MAZE PROCEDURE N/A 04/16/2017   Procedure: MINIMALLY INVASIVE MAZE PROCEDURE;  Surgeon: Purcell Nailswen, Clarence H, MD;  [facility];  Service: Open Heart Surgery;  Laterality: N/A;  . MITRAL VALVE REPAIR Right 04/16/2017   Procedure: MINIMALLY INVASIVE MITRAL VALVE REPAIR (MVR);  Surgeon: Purcell Nailswen, Clarence H, MD;  [facility];  Service: Open Heart Surgery;  Laterality: Right;  . MITRAL VALVE REPAIR Right 04/16/2017   Procedure: RE-EXPLORATION RIGHT THORACOTOMY FOR BLEEDING;  Surgeon: Purcell Nailswen, Clarence H, MD;  [facility];  Service: Open Heart Surgery;  Laterality: Right;  . PERIPHERAL VASCULAR CATHETERIZATION N/A 02/03/2015   Procedure: Upper Extremity Venography;  Surgeon: Duke SalviaSteven C Klein, MD;  [facility];  Service: Cardiovascular;  Laterality: N/A;  . REMOVAL OF PLEURAL DRAINAGE CATHETER Right 09/03/2017   Procedure: REMOVAL OF PLEURAL DRAINAGE CATHETER;  Surgeon: Purcell Nailswen, Clarence H, MD;  [facility];  Service: Thoracic;  Laterality: Right;  . RIGHT/LEFT HEART CATH AND CORONARY ANGIOGRAPHY N/A 03/26/2017   Procedure: RIGHT/LEFT HEART CATH AND CORONARY ANGIOGRAPHY;  Surgeon: Marykay LexHarding, David W, MD;  [facility];  Service: Cardiovascular;  Laterality: N/A;  . TEE WITHOUT CARDIOVERSION N/A 03/18/2017   Procedure: TRANSESOPHAGEAL ECHOCARDIOGRAM (TEE);  Surgeon: Chilton Siandolph, Tiffany, MD;  [facility];  Service: Cardiovascular;  Laterality: N/A;  . TEE WITHOUT CARDIOVERSION N/A 04/16/2017   Procedure: TRANSESOPHAGEAL ECHOCARDIOGRAM (TEE);  Surgeon: Purcell Nailswen, Clarence H, MD;  [facility];  Service: Open Heart Surgery;  Laterality: N/A;  . TONSILLECTOMY       reports that she has quit smoking. She has never used smokeless tobacco. She reports that she does not drink alcohol or use drugs.  Allergies   Allergen Reactions  . Pepcid [Famotidine] Anaphylaxis, Swelling and Other (See Comments)    Causes "Throat Swelling"   . Allopurinol Other (See Comments)    Caused headaches  . Amlodipine Other (See Comments)    Results in PEDAL EDEMA   . Atorvastatin Other (See Comments)    Causes her leg muscles to ache  . Contrast Media [Iodinated Diagnostic Agents] Hives    IVP dye per patient  . Cephalexin Itching and Rash  . Ciprofloxacin Itching and Rash  . Colchicine Rash  . Erythromycin Itching and Rash  . Prilosec [Omeprazole] Nausea Only  . Sulfonamide Derivatives Itching and Rash  . Tetracycline Itching and Rash    Family History  Problem Relation Age of Onset  . Heart disease Mother   . Stroke Mother   . Hypertension Mother   . Heart disease Father   . Heart attack Father   . Brain cancer Brother   . Heart disease Brother   . Melanoma Brother   . Heart attack Brother   . Hypertension Brother   . Ovarian cancer Sister   . Hypertension Sister   .  Rheum arthritis Sister   . Colon cancer Neg Hx      Prior to Admission medications   Medication Sig Start Date End Date Taking? Authorizing Provider  acetaminophen (TYLENOL) 500 MG tablet Take 500 mg by mouth as needed for moderate pain.     [provider]  amoxicillin (AMOXIL) 875 MG tablet  04/07/19   [provider]  apixaban (ELIQUIS) 2.5 MG TABS tablet Take 1 tablet (2.5 mg total) by mouth 2 (two) times daily. 04/22/19   Newman Niparroll, Donna C, NP  calcitRIOL (ROCALTROL) 0.25 MCG capsule Take 0.25 mcg by mouth every other day. In the morning     [provider]  conjugated estrogens (PREMARIN) vaginal cream Place 1 Applicatorful vaginally at bedtime.     [provider]  fexofenadine (ALLEGRA) 180 MG tablet Take 180 mg by mouth daily.    [provider]  fluticasone (FLONASE) 50 MCG/ACT nasal spray Place 2 sprays into both nostrils daily.     [provider]  levothyroxine  (SYNTHROID, LEVOTHROID) 75 MCG tablet Take 75 mcg by mouth daily.    [provider]  potassium chloride SA (KLOR-CON) 20 MEQ tablet Take 2 tablets (40 mEq total) by mouth daily. 04/28/19   Dyann KiefLenze, Michele M, PA-C  vitamin B-12 (CYANOCOBALAMIN) 1000 MCG tablet Take 1,000 mcg by mouth once a week.    [provider]    Physical Exam: Vitals:   05/08/19 0315 05/08/19 0321 05/08/19 0330 05/08/19 0345  BP: (!) 173/155  (!) 151/84   Pulse: (!) 129  (!) 118 (!) 110  Resp: (!) 28  (!) 22 20  Temp:  (!) 103.3 F (39.6 C)    TempSrc:  Rectal    SpO2: 98%  96% 96%    Constitutional: No respiratory distress, no diaphoresis, restless  Eyes: PERTLA, lids and conjunctivae normal ENMT: Mucous membranes are moist. Posterior pharynx clear of any exudate or lesions.   Neck: normal, supple, no masses, no thyromegaly Respiratory: no wheezing, no crackles. Normal respiratory effort. No accessory muscle use.  Cardiovascular: Rate ~110 and regular. No extremity edema.   Abdomen: No distension, no tenderness, soft. Bowel sounds normal.  Musculoskeletal: no clubbing / cyanosis. No joint deformity upper and lower extremities.  Skin: no significant rashes, lesions, ulcers. Warm, dry, well-perfused. Neurologic: no facial asymmetry. Sensation intact. Moving all extremities.   Psychiatric: Disoriented. Restless.    Labs on Admission: I have personally reviewed following labs and imaging studies  CBC: Recent Labs  Lab 05/08/19 0003  WBC 14.8*  NEUTROABS 12.6*  HGB 13.3  HCT 41.5  MCV 97.0  PLT 255   Basic Metabolic Panel: Recent Labs  Lab 05/08/19 0003  NA 138  K 3.7  CL 99  CO2 25  GLUCOSE 189*  BUN 19  CREATININE 1.46*  CALCIUM 10.0   GFR: CrCl cannot be calculated (Unknown ideal weight.). Liver Function Tests: Recent Labs  Lab 05/08/19 0003  AST 42*  ALT 29  ALKPHOS 70  BILITOT 1.0  PROT 6.8  ALBUMIN 4.0   No results for input(s): LIPASE, AMYLASE in the last  168 hours. No results for input(s): AMMONIA in the last 168 hours. Coagulation Profile: Recent Labs  Lab 05/08/19 0003  INR 1.2   Cardiac Enzymes: No results for input(s): CKTOTAL, CKMB, CKMBINDEX, TROPONINI in the last 168 hours. BNP (last 3 results) No results for input(s): PROBNP in the last 8760 hours. HbA1C: No results for input(s): HGBA1C in the last 72 hours.  CBG: Recent Labs  Lab 05/07/19 2359  GLUCAP 169*   Lipid Profile: No results for input(s): CHOL, HDL, LDLCALC, TRIG, CHOLHDL, LDLDIRECT in the last 72 hours. Thyroid Function Tests: No results for input(s): TSH, T4TOTAL, FREET4, T3FREE, THYROIDAB in the last 72 hours. Anemia Panel: No results for input(s): VITAMINB12, FOLATE, FERRITIN, TIBC, IRON, RETICCTPCT in the last 72 hours. Urine analysis:    Component Value Date/Time   COLORURINE STRAW (A) 05/08/2019 0115   APPEARANCEUR CLEAR 05/08/2019 0115   LABSPEC 1.006 05/08/2019 0115   PHURINE 8.0 05/08/2019 0115   GLUCOSEU 50 (A) 05/08/2019 0115   HGBUR MODERATE (A) 05/08/2019 0115   BILIRUBINUR NEGATIVE 05/08/2019 0115   KETONESUR 20 (A) 05/08/2019 0115   PROTEINUR NEGATIVE 05/08/2019 0115   UROBILINOGEN 1.0 04/01/2015 1840   NITRITE NEGATIVE 05/08/2019 0115   LEUKOCYTESUR NEGATIVE 05/08/2019 0115   Sepsis Labs: @LABRCNTIP (procalcitonin:4,lacticidven:4) )No results found for this or any previous visit (from the past 240 hour(s)).   Radiological Exams on Admission: Ct Abdomen Pelvis Wo Contrast  Result Date: 05/08/2019 CLINICAL DATA:  Nausea and vomiting EXAM: CT ABDOMEN AND PELVIS WITHOUT CONTRAST TECHNIQUE: Multidetector CT imaging of the abdomen and pelvis was performed following the standard protocol without IV contrast. COMPARISON:  None. FINDINGS: LOWER CHEST: No basilar pleural or apical pericardial effusion. HEPATOBILIARY: Normal hepatic contours. There is no intra- or extrahepatic biliary dilatation. The gallbladder is normal. PANCREAS: Normal  pancreatic contours without pancreatic ductal dilatation or peripancreatic fluid collection. SPLEEN: Normal. ADRENALS/URINARY TRACT: --Adrenal glands: Normal. --Right kidney/ureter: No hydronephrosis, nephroureterolithiasis or solid renal mass. --Left kidney/ureter: No hydronephrosis, nephroureterolithiasis or solid renal mass. --Urinary bladder: Normal for degree of distention STOMACH/BOWEL: --Stomach/Duodenum: There is no hiatal hernia. The duodenal course and caliber are normal. --Small bowel: No dilatation or inflammation. --Colon: No focal abnormality. --Appendix: Normal. VASCULAR/LYMPHATIC: There is calcific aortic atherosclerosis. No abdominal or pelvic lymphadenopathy. REPRODUCTIVE: Status post hysterectomy. No adnexal mass. MUSCULOSKELETAL. No bony spinal canal stenosis or focal osseous abnormality. OTHER: None. IMPRESSION: No acute abdominopelvic abnormality. Aortic atherosclerosis (ICD10-I70.0). Electronically Signed   By: Deatra Robinson M.D.   On: 05/08/2019 03:42   Ct Head Wo Contrast  Result Date: 05/08/2019 CLINICAL DATA:  Altered mental status EXAM: CT HEAD WITHOUT CONTRAST TECHNIQUE: Contiguous axial images were obtained from the base of the skull through the vertex without intravenous contrast. COMPARISON:  12/26/2018 FINDINGS: Brain: Chronic atrophic and ischemic changes are again identified and stable. Encephalomalacia consistent with prior right MCA infarct is noted and stable. Stable lacunar infarcts are noted within the basal ganglia bilaterally. No findings to suggest acute hemorrhage, acute infarction or space-occupying mass lesion are noted. Vascular: No hyperdense vessel or unexpected calcification. Skull: Normal. Negative for fracture or focal lesion. Sinuses/Orbits: No acute finding. Other: None. IMPRESSION: Chronic atrophic and ischemic changes stable from the prior exam. No acute abnormality noted. Electronically Signed   By: Alcide Clever M.D.   On: 05/08/2019 01:38   Dg Chest  Port 1 View  Result Date: 05/08/2019 CLINICAL DATA:  Fever and altered mental status EXAM: PORTABLE CHEST 1 VIEW COMPARISON:  12/26/2018 FINDINGS: The heart size and mediastinal contours are within normal limits. Both lungs are clear. Right pleural effusion has resolved. There is a leftchest wall pacemakerwith leads projecting within the right atrium and right ventricle. The visualized skeletal structures are unremarkable. Left atrial appendage clip and mitral valve prosthesis. IMPRESSION: No active cardiopulmonary disease. Right pleural effusion has resolved. Electronically Signed   By: Chrisandra Netters.D.  On: 05/08/2019 00:51    EKG: Independently reviewed. V-paced rhythm.   Assessment/Plan   1. SIRS  - Presents with confusion and lethargy, found to be febrile and tachycardic with leukocytosis and elevated lactate  - No evidence for infection on CXR, UA, or CT abd/pelvis, no meningismus, and no cellulitis, rash, or wounds noted  - Blood and urine cultures were collected in ED, NS bolus was given, and she was started on vancomycin, cefepime, and Flagyl  - Continue current antibiotics, follow cultures and clinical course   2. Acute encephalopathy  - Presents with increased confusion and lethargy  - She was admitted with AMS in June 2020, had extensive workup without definite etiology, has had recurrent transient episodes of confusion since then, followed by neurology and with concern for TIA's or seizures and she had recent EEG with non-specific abnormalities but no epileptiform discharges  - Current episode likely secondary to fever/acute illness with underlying dementia  - Continue to treat SIRS/sepsis as above, continue neuro checks and supportive care, and expand workup if she fails to improve as expected with this    3. PAF  - CHADS-VASc is at least 66 (age x2, CVA x2, gender) - Continue Eliquis    4. CKD stage III  - SCr is 1.46 on admission, up from apparent baseline of 1.3  - She  was given NS bolus on admission in setting of suspected sepsis  - Renally-dose medications, avoid nephrotoxins, repeat chem panel in am    5. Hypothyroidism  - Continue Synthroid    6. Dementia  - Recently stopped Aricept and may have started Namenda, pharmacy med-rec pending    7. History CVA  - Continue Eliquis    PPE: Mask, face shield  DVT prophylaxis: Eliquis  Code Status: Full  Family Communication: Daughter updated at bedside Consults called: None  Admission status: Observation     Vianne Bulls, MD [facility] Pager [phone]  If 7PM-7AM, please contact night-coverage [email or web address] Password [email or web address]  05/08/2019, 4:29 AM

## 2019-05-08 NOTE — Progress Notes (Signed)
This is a pleasant 78 year old female who was admitted early morning (please see H&P for details) due to lethargy, confusion and nausea vomiting and diarrhea.  I saw patient in the room.  She was alert but she remained confused.  She knew that she was in the hospital but initially she could not tell me the name.  Once I told her that she is at [facility].  All the rest of my questions that I asked, her answer was " I am at [facility]".  She clearly was confused but alert.  She did not have any complaint.  Looks comfortable. On examination her lungs were clear to auscultation.  Abdomen was soft and nontender.  No focal neurological deficit.  Chart reviewed which shows clear chest x-ray, UA and CT abdomen and pelvis with no source of infection however she continues to have high-grade fever with persistent tachycardia and tachypnea meeting criteria for sepsis with source unknown as of now.  To find out the source, I also examined full body skin and could not find out any cellulitis either.  At this point in time, source remains unclear.  We will have to wait for final culture reports.  She is on cefepime, Flagyl and vancomycin at this point in time.  A lot of data recently has shown that cefepime can cause encephalopathy so I will discontinue that and instead place her on Zosyn and will continue vancomycin but also discontinue Flagyl.  She has slightly low urine output.  She has not had any bowel movement since admission.  I doubt GI source since CT abdomen and pelvis was negative and abdominal exam is benign as well.  I also doubt meningitis or encephalitis since she did not have any signs for meningitis.  Her lactic acidosis has resolved.  Procalcitonin also unremarkable.  I also spoke to patient's daughter Jenny Reichmann at [phone].  She was appreciative of the call and requested a call tomorrow once her physician sees her.

## 2019-05-08 NOTE — Progress Notes (Signed)
Received 78 year old  White female from [facility] Ed with code sepsis. Lactic  acid 2.9   Per ED RN report pt  was found sitting on the batth room floor at home, with AMS and vomiting.  Pt still having a fever of 101.1 F  Opens eyes to her name  Moving all extremities  and only follow simple command . Unable to tell he name, age and place and the mo]nth nor the year at this time.   RN reoriented pt to  person,  place  and month/year. Made comfortable in bed.  No vomiting at this time .500cc NS bolus infusing.   Will continue to monitor. Covid -19 result is t negative.[name]

## 2019-05-08 NOTE — ED Notes (Signed)
ED TO INPATIENT HANDOFF REPORT  ED Nurse Name and Phone #: Revonda Menter [phone]  S Name/Age/Gender Amanda [name] 78 y.o. female Room/Bed: RESUSC/RESUSC  Code Status   Code Status: Prior  Home/SNF/Other Home Patient oriented to: self Is this baseline? No      Chief Complaint Unresponsive  Triage Note Pt arrived with EMS from home where she lives with children. Per family pt was found sitting in bathroom floor after being assisted to bathroom, unsure if pt fell. Pt has had vomiting and had witnessed episode of emesis when EMS arrived. Pt was altered at scene but not completely unresponsive. Pt acknowledges you when you say her name but is not following commands or answering questions. Per family pt has been progressively getting worse as far as mental status.      Allergies Allergies  Allergen Reactions  . Pepcid [Famotidine] Anaphylaxis, Swelling and Other (See Comments)    Causes "Throat Swelling"   . Allopurinol Other (See Comments)    Caused headaches  . Amlodipine Other (See Comments)    Results in PEDAL EDEMA   . Atorvastatin Other (See Comments)    Causes her leg muscles to ache  . Contrast Media [Iodinated Diagnostic Agents] Hives    IVP dye per patient  . Cephalexin Itching and Rash  . Ciprofloxacin Itching and Rash  . Colchicine Rash  . Erythromycin Itching and Rash  . Prilosec [Omeprazole] Nausea Only  . Sulfonamide Derivatives Itching and Rash  . Tetracycline Itching and Rash    Level of Care/Admitting Diagnosis ED Disposition    ED Disposition Condition Comment   Admit  Hospital Area: [facility] [[id number]]  Level of Care: Telemetry Medical [104]  I expect the patient will be discharged within 24 hours: No (not a candidate for 5C-Observation unit)  Covid Evaluation: Asymptomatic Screening Protocol (No Symptoms)  Diagnosis: SIRS (systemic inflammatory response syndrome) (HCC) [578469]  Admitting Physician: Briscoe Deutscher [[id number]]   Attending Physician: Briscoe Deutscher [[id number]]  PT Class (Do Not Modify): Observation [104]  PT Acc Code (Do Not Modify): Observation [10022]       B Medical/Surgery History Past Medical History:  Diagnosis Date  . Allergic rhinitis   . Anemia   . Asymptomatic LV dysfunction    EF 45-50% echo 07/2017  . CKD (chronic kidney disease), stage III    stage III  . DJD (degenerative joint disease)   . Glomerulonephritis    Dr [name]  . Hyperlipidemia   . Hyperparathyroidism (HCC)   . Hypertension   . Hypothyroidism   . IBS (irritable bowel syndrome)   . Interstitial cystitis   . Mitral regurgitation   . Mobitz II    a. s/p STJ dual chamber PPM   . MVP (mitral valve prolapse)    moderate posterior MVP with moderate MR and grade II diasotlic dysfunction  . PAC (premature atrial contraction)   . PAF (paroxysmal atrial fibrillation) (HCC)     note on pacer check. CHADS2VASC score is 4 now on Eliquis.  [name] PVC's (premature ventricular contractions)   . S/P minimally invasive maze operation for atrial fibrillation 04/16/2017   Complete bilateral atrial lesion set using cryothermy and bipolar radiofrequency ablation with clipping of LA appendage via right mini thoracotomy approach  . S/P minimally invasive mitral valve repair 04/16/2017   Complex valvuloplasty including triangular resection of posterior leaflet, artificial Gore-tex neochords x6 and Sorin Memo 3D ring annuloplasty ([id number], size 32, serial # [id number])  . S/P placement  of cardiac pacemaker   . Small vessel disease, cerebrovascular   . Stroke [facility](HCC)    a. old stroke seen on imaging.   Past Surgical History:  Procedure Laterality Date  . ABDOMINAL HYSTERECTOMY    . BREAST BIOPSY Right   . CHEST TUBE INSERTION Right 05/23/2017   Procedure: INSERTION PLEURAL DRAINAGE CATHETER;  Surgeon: Purcell Nailswen, Clarence H, MD;  [facility];  Service: Thoracic;  Laterality: Right;  possible pleurex catheter  . CLIPPING OF ATRIAL APPENDAGE   04/16/2017   Procedure: CLIPPING OF ATRIAL APPENDAGE using AtriCure Pro2 clip 45;  Surgeon: Purcell Nailswen, Clarence H, MD;  Location: [facility] OR;  Service: Open Heart Surgery;;  . CYSTOSTOMY W/ BLADDER BIOPSY    . EP IMPLANTABLE DEVICE N/A 01/07/2015   STJ dual chamber pacemaker implanted by Dr Ladona Ridgelaylor for 2:1 heart block  . MINIMALLY INVASIVE MAZE PROCEDURE N/A 04/16/2017   Procedure: MINIMALLY INVASIVE MAZE PROCEDURE;  Surgeon: Purcell Nailswen, Clarence H, MD;  [facility];  Service: Open Heart Surgery;  Laterality: N/A;  . MITRAL VALVE REPAIR Right 04/16/2017   Procedure: MINIMALLY INVASIVE MITRAL VALVE REPAIR (MVR);  Surgeon: Purcell Nailswen, Clarence H, MD;  [facility];  Service: Open Heart Surgery;  Laterality: Right;  . MITRAL VALVE REPAIR Right 04/16/2017   Procedure: RE-EXPLORATION RIGHT THORACOTOMY FOR BLEEDING;  Surgeon: Purcell Nailswen, Clarence H, MD;  [facility];  Service: Open Heart Surgery;  Laterality: Right;  . PERIPHERAL VASCULAR CATHETERIZATION N/A 02/03/2015   Procedure: Upper Extremity Venography;  Surgeon: Duke SalviaSteven C Klein, MD;  [facility];  Service: Cardiovascular;  Laterality: N/A;  . REMOVAL OF PLEURAL DRAINAGE CATHETER Right 09/03/2017   Procedure: REMOVAL OF PLEURAL DRAINAGE CATHETER;  Surgeon: Purcell Nailswen, Clarence H, MD;  [facility];  Service: Thoracic;  Laterality: Right;  . RIGHT/LEFT HEART CATH AND CORONARY ANGIOGRAPHY N/A 03/26/2017   Procedure: RIGHT/LEFT HEART CATH AND CORONARY ANGIOGRAPHY;  Surgeon: Marykay LexHarding, David W, MD;  [facility];  Service: Cardiovascular;  Laterality: N/A;  . TEE WITHOUT CARDIOVERSION N/A 03/18/2017   Procedure: TRANSESOPHAGEAL ECHOCARDIOGRAM (TEE);  Surgeon: Chilton Siandolph, Tiffany, MD;  [facility];  Service: Cardiovascular;  Laterality: N/A;  . TEE WITHOUT CARDIOVERSION N/A 04/16/2017   Procedure: TRANSESOPHAGEAL ECHOCARDIOGRAM (TEE);  Surgeon: Purcell Nailswen, Clarence H, MD;  [facility];  Service: Open Heart Surgery;  Laterality: N/A;  .  TONSILLECTOMY       A IV Location/Drains/Wounds Patient Lines/Drains/Airways Status   Active Line/Drains/Airways    Name:   Placement date:   Placement time:   Site:   Days:   Peripheral IV 05/08/19 Left Antecubital   05/08/19    0022    Antecubital   less than 1   Peripheral IV 05/08/19 Right Antecubital   05/08/19    0045    Antecubital   less than 1   Peripheral IV 05/08/19 Posterior;Right Arm   05/08/19    0342    Arm   less than 1   Chest Tube 1 Right Pleural   05/23/17    1559    Pleural   715   External Urinary Catheter   04/22/17    2145    -   746   Incision (Closed) 04/16/17 Groin Right   04/16/17    0807     752   Incision (Closed) 04/16/17 Chest Other (Comment)   04/16/17    0807     752   Incision (Closed) 04/16/17 Chest Right   04/16/17  2154     752   Incision (Closed) 05/23/17 Chest Right   05/23/17    1554     715          Intake/Output Last 24 hours No intake or output data in the 24 hours ending 05/08/19 0524  Labs/Imaging Results for orders placed or performed during the hospital encounter of 05/07/19 (from the past 48 hour(s))  CBG monitoring, ED     Status: Abnormal   Collection Time: 05/07/19 11:59 PM  Result Value Ref Range   Glucose-Capillary 169 (H) 70 - 99 mg/dL  Lactic acid, plasma     Status: Abnormal   Collection Time: 05/08/19 12:03 AM  Result Value Ref Range   Lactic Acid, Venous 2.6 (HH) 0.5 - 1.9 mmol/L    Comment: CRITICAL RESULT CALLED TO, READ BACK BY AND VERIFIED WITH: RN L LEONNERD @0204  05/08/19 BY S GEZAHEGN Performed at [facility], [address], [place], [place] [address]   Comprehensive metabolic panel     Status: Abnormal   Collection Time: 05/08/19 12:03 AM  Result Value Ref Range   Sodium 138 135 - 145 mmol/L   Potassium 3.7 3.5 - 5.1 mmol/L   Chloride 99 98 - 111 mmol/L   CO2 25 22 - 32 mmol/L   Glucose, Bld 189 (H) 70 - 99 mg/dL   BUN 19 8 - 23 mg/dL   Creatinine, Ser 8.46 (H) 0.44 - 1.00 mg/dL   Calcium 65.9  8.9 - 93.5 mg/dL   Total Protein 6.8 6.5 - 8.1 g/dL   Albumin 4.0 3.5 - 5.0 g/dL   AST 42 (H) 15 - 41 U/L   ALT 29 0 - 44 U/L   Alkaline Phosphatase 70 38 - 126 U/L   Total Bilirubin 1.0 0.3 - 1.2 mg/dL   GFR calc non Af Amer 34 (L) >60 mL/min   GFR calc Af Amer 39 (L) >60 mL/min   Anion gap 14 5 - 15    Comment: Performed at [facility], [address], [place], [place] [address]  CBC WITH DIFFERENTIAL     Status: Abnormal   Collection Time: 05/08/19 12:03 AM  Result Value Ref Range   WBC 14.8 (H) 4.0 - 10.5 K/uL   RBC 4.28 3.87 - 5.11 MIL/uL   Hemoglobin 13.3 12.0 - 15.0 g/dL   HCT 93.9 03.0 - 09.2 %   MCV 97.0 80.0 - 100.0 fL   MCH 31.1 26.0 - 34.0 pg   MCHC 32.0 30.0 - 36.0 g/dL   RDW 33.0 07.6 - 22.6 %   Platelets 255 150 - 400 K/uL   nRBC 0.0 0.0 - 0.2 %   Neutrophils Relative % 85 %   Neutro Abs 12.6 (H) 1.7 - 7.7 K/uL   Lymphocytes Relative 9 %   Lymphs Abs 1.4 0.7 - 4.0 K/uL   Monocytes Relative 5 %   Monocytes Absolute 0.7 0.1 - 1.0 K/uL   Eosinophils Relative 0 %   Eosinophils Absolute 0.0 0.0 - 0.5 K/uL   Basophils Relative 0 %   Basophils Absolute 0.1 0.0 - 0.1 K/uL   Immature Granulocytes 1 %   Abs Immature Granulocytes 0.08 (H) 0.00 - 0.07 K/uL    Comment: Performed at [facility], [address], [place], [place] [address]  APTT     Status: None   Collection Time: 05/08/19 12:03 AM  Result Value Ref Range   aPTT 32 24 - 36 seconds  Comment: Performed at [facility], [address] [place], [place] [address]  Protime-INR     Status: Abnormal   Collection Time: 05/08/19 12:03 AM  Result Value Ref Range   Prothrombin Time 15.3 (H) 11.4 - 15.2 seconds   INR 1.2 0.8 - 1.2    Comment: (NOTE) INR goal varies based on device and disease states. Performed at [facility], [address] [place], [place] [address]   Urinalysis, Routine w reflex microscopic     Status: Abnormal   Collection Time: 05/08/19  1:15 AM  Result Value  Ref Range   Color, Urine STRAW (A) YELLOW   APPearance CLEAR CLEAR   Specific Gravity, Urine 1.006 1.005 - 1.030   pH 8.0 5.0 - 8.0   Glucose, UA 50 (A) NEGATIVE mg/dL   Hgb urine dipstick MODERATE (A) NEGATIVE   Bilirubin Urine NEGATIVE NEGATIVE   Ketones, ur 20 (A) NEGATIVE mg/dL   Protein, ur NEGATIVE NEGATIVE mg/dL   Nitrite NEGATIVE NEGATIVE   Leukocytes,Ua NEGATIVE NEGATIVE   RBC / HPF 11-20 0 - 5 RBC/hpf   WBC, UA 0-5 0 - 5 WBC/hpf   Bacteria, UA NONE SEEN NONE SEEN    Comment: Performed at [facility], [address] [place], [place] [address]  Lactic acid, plasma     Status: Abnormal   Collection Time: 05/08/19  2:10 AM  Result Value Ref Range   Lactic Acid, Venous 2.9 (HH) 0.5 - 1.9 mmol/L    Comment: CRITICAL VALUE NOTED.  VALUE IS CONSISTENT WITH PREVIOUSLY REPORTED AND CALLED VALUE. Performed at [facility], [address] [place], [place] [address]    Ct Abdomen Pelvis Wo Contrast  Result Date: 05/08/2019 CLINICAL DATA:  Nausea and vomiting EXAM: CT ABDOMEN AND PELVIS WITHOUT CONTRAST TECHNIQUE: Multidetector CT imaging of the abdomen and pelvis was performed following the standard protocol without IV contrast. COMPARISON:  None. FINDINGS: LOWER CHEST: No basilar pleural or apical pericardial effusion. HEPATOBILIARY: Normal hepatic contours. There is no intra- or extrahepatic biliary dilatation. The gallbladder is normal. PANCREAS: Normal pancreatic contours without pancreatic ductal dilatation or peripancreatic fluid collection. SPLEEN: Normal. ADRENALS/URINARY TRACT: --Adrenal glands: Normal. --Right kidney/ureter: No hydronephrosis, nephroureterolithiasis or solid renal mass. --Left kidney/ureter: No hydronephrosis, nephroureterolithiasis or solid renal mass. --Urinary bladder: Normal for degree of distention STOMACH/BOWEL: --Stomach/Duodenum: There is no hiatal hernia. The duodenal course and caliber are normal. --Small bowel: No dilatation or  inflammation. --Colon: No focal abnormality. --Appendix: Normal. VASCULAR/LYMPHATIC: There is calcific aortic atherosclerosis. No abdominal or pelvic lymphadenopathy. REPRODUCTIVE: Status post hysterectomy. No adnexal mass. MUSCULOSKELETAL. No bony spinal canal stenosis or focal osseous abnormality. OTHER: None. IMPRESSION: No acute abdominopelvic abnormality. Aortic atherosclerosis (ICD10-I70.0). Electronically Signed   By: Deatra Robinson M.D.   On: 05/08/2019 03:42   Ct Head Wo Contrast  Result Date: 05/08/2019 CLINICAL DATA:  Altered mental status EXAM: CT HEAD WITHOUT CONTRAST TECHNIQUE: Contiguous axial images were obtained from the base of the skull through the vertex without intravenous contrast. COMPARISON:  12/26/2018 FINDINGS: Brain: Chronic atrophic and ischemic changes are again identified and stable. Encephalomalacia consistent with prior right MCA infarct is noted and stable. Stable lacunar infarcts are noted within the basal ganglia bilaterally. No findings to suggest acute hemorrhage, acute infarction or space-occupying mass lesion are noted. Vascular: No hyperdense vessel or unexpected calcification. Skull: Normal. Negative for fracture or focal lesion. Sinuses/Orbits: No acute finding. Other: None. IMPRESSION: Chronic atrophic and ischemic changes stable  from the prior exam. No acute abnormality noted. Electronically Signed   By: Alcide CleverMark  Lukens M.D.   On: 05/08/2019 01:38   Dg Chest Port 1 View  Result Date: 05/08/2019 CLINICAL DATA:  Fever and altered mental status EXAM: PORTABLE CHEST 1 VIEW COMPARISON:  12/26/2018 FINDINGS: The heart size and mediastinal contours are within normal limits. Both lungs are clear. Right pleural effusion has resolved. There is a leftchest wall pacemakerwith leads projecting within the right atrium and right ventricle. The visualized skeletal structures are unremarkable. Left atrial appendage clip and mitral valve prosthesis. IMPRESSION: No active  cardiopulmonary disease. Right pleural effusion has resolved. Electronically Signed   By: Deatra RobinsonKevin  Herman M.D.   On: 05/08/2019 00:51    Pending Labs Unresulted Labs (From admission, onward)    Start     Ordered   05/08/19 0005  SARS CORONAVIRUS 2 (TAT 6-24 HRS) Nasopharyngeal Nasopharyngeal Swab  (Asymptomatic/Tier 2 Patients Labs)  Once,   STAT    Question Answer Comment  Is this test for diagnosis or screening Screening   Symptomatic for COVID-19 as defined by CDC No   Hospitalized for COVID-19 No   Admitted to ICU for COVID-19 No   Previously tested for COVID-19 Yes   Resident in a congregate (group) care setting No   Employed in healthcare setting No   Pregnant No      05/08/19 0004   05/08/19 0003  Blood Culture (routine x 2)  BLOOD CULTURE X 2,   STAT     05/08/19 0003   05/08/19 0003  Urine culture  ONCE - STAT,   STAT     05/08/19 0003   Signed and Held  Lactic acid, plasma  Now then every 3 hours,   STAT     Signed and Held   Signed and Held  Basic metabolic panel  Daily,   R     Signed and Held   Signed and Held  CBC WITH DIFFERENTIAL  Daily,   R     Signed and Held          Vitals/Pain Today's Vitals   05/08/19 0415 05/08/19 0430 05/08/19 0445 05/08/19 0500  BP:   (!) 148/107 (!) 148/125  Pulse: (!) 114 (!) 101 (!) 114 (!) 109  Resp: 20 (!) 30 17 (!) 22  Temp:      TempSrc:      SpO2: 98% 95% 96% 92%    Isolation Precautions No active isolations  Medications Medications  ceFEPIme (MAXIPIME) 2 g in sodium chloride 0.9 % 100 mL IVPB (has no administration in time range)  vancomycin (VANCOCIN) IVPB 1000 mg/200 mL premix (has no administration in time range)  sodium chloride 0.9 % bolus 500 mL (has no administration in time range)  pantoprazole (PROTONIX) injection 40 mg (has no administration in time range)  metroNIDAZOLE (FLAGYL) IVPB 500 mg (0 mg Intravenous Stopped 05/08/19 0220)  vancomycin (VANCOCIN) IVPB 1000 mg/200 mL premix (0 mg Intravenous  Stopped 05/08/19 0234)  acetaminophen (TYLENOL) suppository 650 mg (650 mg Rectal Given 05/08/19 0110)  ceFEPIme (MAXIPIME) 2 g in sodium chloride 0.9 % 100 mL IVPB (0 g Intravenous Stopped 05/08/19 0205)  ondansetron (ZOFRAN) injection 4 mg (4 mg Intravenous Given 05/08/19 0223)  sodium chloride 0.9 % bolus 1,000 mL (1,000 mLs Intravenous New Bag/Given 05/08/19 0322)    Mobility non-ambulatory     Focused Assessments Neuro Assessment Handoff:  Swallow screen pass? No  Neuro Assessment: Exceptions to WDL Neuro Checks:      Last Documented NIHSS Modified Score:   Has TPA been given? No If patient is a Neuro Trauma and patient is going to OR before floor call report to [facility] nurse: [phone] or [phone]     R Recommendations: See Admitting Provider Note  Report given to:   Additional Notes: n/a

## 2019-05-08 NOTE — Evaluation (Addendum)
Occupational Therapy Evaluation Patient Details Name: Amanda [name] MRN: [id number] DOB: 1940/12/17 Today's Date: 05/08/2019    History of Present Illness Amanda [name] is a 78 y.o. female with medical history significant for dementia, transient confusional episodes, CVA, paroxysmal atrial fibrillation on Eliquis, hypothyroidism, and chronic kidney disease stage III, admitted increased confusion and lethargy with fever and concern for sepsis.   Clinical Impression   This 78 y/o female presents with the above. PTA pt performing mobility and ADL independently, with intermittent supervision from family. Pt with baseline cognitive deficits, pleasantly confused this session though easily distracted by lines/leads/etc and requiring frequent redirection to functional tasks. Pt requiring min-modA for toileting and standing grooming ADL, minA for room level functional mobility (HHA). Pt's daughter present and supportive throughout session. Pt reports some dizziness in standing with BP taken after returned to sitting and stable. She will benefit from continued acute OT services and recommend follow up OT services after discharge. Pt's daughter reports family with preference to take pt home with assist. If pt to return home recommend she have hands on 24hr supervision/assist to maximize her overall safety and independence with ADL and mobility. Will follow.     Follow Up Recommendations  Home health OT;Supervision/Assistance - 24 hour(if 24hr not available recommend SNF)    Equipment Recommendations  Tub/shower seat           Precautions / Restrictions Precautions Precautions: Fall Restrictions Weight Bearing Restrictions: No      Mobility Bed Mobility Overal bed mobility: Needs Assistance Bed Mobility: Supine to Sit     Supine to sit: Min guard     General bed mobility comments: OOB in recliner  Transfers Overall transfer level: Needs assistance Equipment used:  None Transfers: Sit to/from Stand Sit to Stand: Min guard         General transfer comment: for safety and lines    Balance Overall balance assessment: Needs assistance   Sitting balance-Leahy Scale: Good     Standing balance support: During functional activity Standing balance-Leahy Scale: Poor Standing balance comment: minguard provided at least for balance                           ADL either performed or assessed with clinical judgement   ADL Overall ADL's : Needs assistance/impaired Eating/Feeding: Supervision/ safety;Sitting   Grooming: Moderate assistance;Minimal assistance;Oral care;Wash/dry hands Grooming Details (indicate cue type and reason): required cueing/assist to sequence through oral care and for proper use of items during oral care Upper Body Bathing: Minimal assistance;Sitting   Lower Body Bathing: Moderate assistance;Sit to/from stand   Upper Body Dressing : Minimal assistance;Sitting   Lower Body Dressing: Moderate assistance;Sit to/from stand   Toilet Transfer: Minimal assistance;Ambulation;Grab bars   Toileting- Clothing Manipulation and Hygiene: Minimal assistance;Sit to/from stand;Sitting/lateral lean Toileting - Clothing Manipulation Details (indicate cue type and reason): cues to initiate pericare as pt easily distracted     Functional mobility during ADLs: Minimal assistance(HHA)       Vision         Perception     Praxis      Pertinent Vitals/Pain Pain Assessment: No/denies pain     Hand Dominance     Extremity/Trunk Assessment Upper Extremity Assessment Upper Extremity Assessment: Generalized weakness   Lower Extremity Assessment Lower Extremity Assessment: Defer to PT evaluation;Overall [facility] for tasks assessed       Communication Communication Communication: No difficulties   Cognition Arousal/Alertness: Awake/alert Behavior  During Therapy: WFL for tasks assessed/performed Overall Cognitive Status:  Impaired/Different from baseline Area of Impairment: Attention;Memory;Following commands;Safety/judgement;Problem solving;Awareness                 Orientation Level: Disoriented to;Place;Situation;Time Current Attention Level: Sustained Memory: Decreased short-term memory Following Commands: Follows one step commands inconsistently;Follows one step commands with increased time Safety/Judgement: Decreased awareness of safety;Decreased awareness of deficits Awareness: Intellectual Problem Solving: Slow processing;Requires verbal cues;Requires tactile cues General Comments: per daughter confusion has worsened; pt with notable confusion/difficulty performing functional tasks, partly due to pt VERY easily distracted by lines/leads/gait belt/etc requiring cueing and redirection to complete. pt also with difficulty properly using grooming items during oral care   General Comments  VSS, daughter present during session    Exercises     Shoulder Instructions      Home Living Family/patient expects to be discharged to:: Private residence Living Arrangements: Alone Available Help at Discharge: Family;Available PRN/intermittently Type of Home: House Home Access: Stairs to enter Entrance Stairs-Number of Steps: 2   Home Layout: One level         Bathroom Toilet: Standard     Home Equipment: None   Additional Comments: was staying alone at night, daughters or sister there during the day due to confusion      Prior Functioning/Environment Level of Independence: Independent                 OT Problem List: Decreased strength;Decreased range of motion;Decreased activity tolerance;Impaired balance (sitting and/or standing);Decreased safety awareness;Decreased knowledge of use of DME or AE;Decreased cognition      OT Treatment/Interventions: Self-care/ADL training;Therapeutic exercise;Energy conservation;DME and/or AE instruction;Therapeutic activities;Patient/family  education;Balance training;Cognitive remediation/compensation    OT Goals(Current goals can be found in the care plan section) Acute Rehab OT Goals Patient Stated Goal: to take pt home OT Goal Formulation: With patient Time For Goal Achievement: 05/22/19 Potential to Achieve Goals: Good  OT Frequency: Min 2X/week   Barriers to D/C:            Co-evaluation              AM-PAC OT "6 Clicks" Daily Activity     Outcome Measure Help from another person eating meals?: A Little Help from another person taking care of personal grooming?: A Lot Help from another person toileting, which includes using toliet, bedpan, or urinal?: A Lot Help from another person bathing (including washing, rinsing, drying)?: A Lot Help from another person to put on and taking off regular upper body clothing?: A Little Help from another person to put on and taking off regular lower body clothing?: A Lot 6 Click Score: 14   End of Session Equipment Utilized During Treatment: Gait belt Nurse Communication: Mobility status  Activity Tolerance: Patient tolerated treatment well Patient left: in chair;with call bell/phone within reach;with chair alarm set;with family/visitor present  OT Visit Diagnosis: Unsteadiness on feet (R26.81);Muscle weakness (generalized) (M62.81);Other symptoms and signs involving cognitive function                Time: 6010-9323 OT Time Calculation (min): 35 min Charges:  OT General Charges $OT Visit: 1 Visit OT Evaluation $OT Eval Moderate Complexity: 1 Mod OT Treatments $Self Care/Home Management : 8-22 mins  Lou Cal, OT [facility] Pager [phone] Office [place] 05/08/2019, 4:54 PM

## 2019-05-08 NOTE — ED Provider Notes (Signed)
[facility] EMERGENCY DEPARTMENT Provider Note  CSN: [id number] Arrival date & time: 05/07/19 2342  Chief Complaint(s) Altered Mental Status  Triage Note 0006 Pt arrived with EMS from home where she lives with children. Per family pt was found sitting in bathroom floor after being assisted to bathroom, unsure if pt fell. Pt has had vomiting and had witnessed episode of emesis when EMS arrived. Pt was altered at scene but not completely unresponsive. Pt acknowledges you when you say her name but is not following commands or answering questions. Per family pt has been progressively getting worse as far as mental status.    HPI Amanda [name] is a 78 y.o. female here for emesis and worsening AMS.  Remainder of history, ROS, and physical exam limited due to patient's condition (AMS). Additional information was obtained from EMS.   Level V Caveat.    HPI  Past Medical History Past Medical History:  Diagnosis Date  . Allergic rhinitis   . Anemia   . Asymptomatic LV dysfunction    EF 45-50% echo 07/2017  . CKD (chronic kidney disease), stage III    stage III  . DJD (degenerative joint disease)   . Glomerulonephritis    Dr [name]  . Hyperlipidemia   . Hyperparathyroidism (HCC)   . Hypertension   . Hypothyroidism   . IBS (irritable bowel syndrome)   . Interstitial cystitis   . Mitral regurgitation   . Mobitz II    a. s/p STJ dual chamber PPM   . MVP (mitral valve prolapse)    moderate posterior MVP with moderate MR and grade II diasotlic dysfunction  . PAC (premature atrial contraction)   . PAF (paroxysmal atrial fibrillation) (HCC)     note on pacer check. CHADS2VASC score is 4 now on Eliquis.  [name] PVC's (premature ventricular contractions)   . S/P minimally invasive maze operation for atrial fibrillation 04/16/2017   Complete bilateral atrial lesion set using cryothermy and bipolar radiofrequency ablation with clipping of LA appendage via right mini  thoracotomy approach  . S/P minimally invasive mitral valve repair 04/16/2017   Complex valvuloplasty including triangular resection of posterior leaflet, artificial Gore-tex neochords x6 and Sorin Memo 3D ring annuloplasty ([id number], size 32, serial # [id number])  . S/P placement of cardiac pacemaker   . Small vessel disease, cerebrovascular   . Stroke [facility]    a. old stroke seen on imaging.   Patient Active Problem List   Diagnosis Date Noted  . Hypokalemia 12/27/2018  . Dementia (HCC) 12/27/2018  . PVC's (premature ventricular contractions) 04/16/2018  . Pleural effusion on right 05/22/2017  . Pleural effusion 05/22/2017  . Chronic anticoagulation   . Oral thrush   . Subtherapeutic anticoagulation   . Sternal pain   . SOB (shortness of breath)   . Stage 3 chronic kidney disease   . Debility 04/26/2017  . History of CVA (cerebrovascular accident)   . S/P placement of cardiac pacemaker   . Post-operative pain   . Labile blood glucose   . Hypernatremia   . S/P minimally invasive mitral valve repair + maze procedure 04/16/2017  . S/P minimally invasive maze operation for atrial fibrillation 04/16/2017  . Paroxysmal atrial fibrillation (HCC)   . Acute delirium 04/28/2015  . Acute UTI 04/01/2015  . Acute encephalopathy 04/01/2015  . AKI (acute kidney injury) (HCC) 04/01/2015  . Leukocytosis 04/01/2015  . Thrombocytopenia (HCC) 04/01/2015  . Hyponatremia 04/01/2015  . Hypothyroidism 04/01/2015  . CKD (chronic kidney disease),  stage III (HCC) 04/01/2015  . UTI (lower urinary tract infection)   . Severe sepsis without septic shock (HCC)   . Pacemaker 02/03/2015  . AV block 01/06/2015  . RBBB 05/21/2014  . Mitral regurgitation 11/26/2013  . PAC (premature atrial contraction)   . DIARRHEA 07/24/2010  . GLOMERULONEPHRITIS 03/27/2010  . COLITIS 10/06/2009  . Essential hypertension 10/05/2009  . DYSPHAGIA UNSPECIFIED 10/05/2009  . Personal history of other endocrine, metabolic, and  immunity disorders 10/05/2009   Home Medication(s) Prior to Admission medications   Medication Sig Start Date End Date Taking? Authorizing Provider  acetaminophen (TYLENOL) 500 MG tablet Take 500 mg by mouth as needed for moderate pain.     [provider]  amoxicillin (AMOXIL) 875 MG tablet  04/07/19   [provider]  apixaban (ELIQUIS) 2.5 MG TABS tablet Take 1 tablet (2.5 mg total) by mouth 2 (two) times daily. 04/22/19   Newman Nip, NP  calcitRIOL (ROCALTROL) 0.25 MCG capsule Take 0.25 mcg by mouth every other day. In the morning     [provider]  conjugated estrogens (PREMARIN) vaginal cream Place 1 Applicatorful vaginally at bedtime.     [provider]  fexofenadine (ALLEGRA) 180 MG tablet Take 180 mg by mouth daily.    [provider]  fluticasone (FLONASE) 50 MCG/ACT nasal spray Place 2 sprays into both nostrils daily.     [provider]  levothyroxine (SYNTHROID, LEVOTHROID) 75 MCG tablet Take 75 mcg by mouth daily.    [provider]  potassium chloride SA (KLOR-CON) 20 MEQ tablet Take 2 tablets (40 mEq total) by mouth daily. 04/28/19   Dyann Kief, PA-C  vitamin B-12 (CYANOCOBALAMIN) 1000 MCG tablet Take 1,000 mcg by mouth once a week.    [provider]                                                                                                                                    Past Surgical History Past Surgical History:  Procedure Laterality Date  . ABDOMINAL HYSTERECTOMY    . BREAST BIOPSY Right   . CHEST TUBE INSERTION Right 05/23/2017   Procedure: INSERTION PLEURAL DRAINAGE CATHETER;  Surgeon: Purcell Nails, MD;  Location: [facility] OR;  Service: Thoracic;  Laterality: Right;  possible pleurex catheter  . CLIPPING OF ATRIAL APPENDAGE  04/16/2017   Procedure: CLIPPING OF ATRIAL APPENDAGE using AtriCure Pro2 clip 45;  Surgeon: Purcell Nails, MD;  Location: [facility] OR;  Service: Open Heart  Surgery;;  . CYSTOSTOMY W/ BLADDER BIOPSY    . EP IMPLANTABLE DEVICE N/A 01/07/2015   STJ dual chamber pacemaker implanted by Dr [name] for 2:1 heart block  . MINIMALLY INVASIVE MAZE PROCEDURE N/A 04/16/2017   Procedure: MINIMALLY INVASIVE MAZE PROCEDURE;  Surgeon: Purcell Nails, MD;  Location: [facility] OR;  Service: Open Heart Surgery;  Laterality: N/A;  . MITRAL VALVE REPAIR Right 04/16/2017  Procedure: MINIMALLY INVASIVE MITRAL VALVE REPAIR (MVR);  Surgeon: Purcell Nails, MD;  Location: [facility] OR;  Service: Open Heart Surgery;  Laterality: Right;  . MITRAL VALVE REPAIR Right 04/16/2017   Procedure: RE-EXPLORATION RIGHT THORACOTOMY FOR BLEEDING;  Surgeon: Purcell Nails, MD;  Location: [facility] OR;  Service: Open Heart Surgery;  Laterality: Right;  . PERIPHERAL VASCULAR CATHETERIZATION N/A 02/03/2015   Procedure: Upper Extremity Venography;  Surgeon: [name], MD;  Location: [facility] INVASIVE CV LAB;  Service: Cardiovascular;  Laterality: N/A;  . REMOVAL OF PLEURAL DRAINAGE CATHETER Right 09/03/2017   Procedure: REMOVAL OF PLEURAL DRAINAGE CATHETER;  Surgeon: Purcell Nails, MD;  Location: [facility] OR;  Service: Thoracic;  Laterality: Right;  . RIGHT/LEFT HEART CATH AND CORONARY ANGIOGRAPHY N/A 03/26/2017   Procedure: RIGHT/LEFT HEART CATH AND CORONARY ANGIOGRAPHY;  Surgeon: [name], MD;  Location: [facility] INVASIVE CV LAB;  Service: Cardiovascular;  Laterality: N/A;  . TEE WITHOUT CARDIOVERSION N/A 03/18/2017   Procedure: TRANSESOPHAGEAL ECHOCARDIOGRAM (TEE);  Surgeon: [name], MD;  Location: [facility] ENDOSCOPY;  Service: Cardiovascular;  Laterality: N/A;  . TEE WITHOUT CARDIOVERSION N/A 04/16/2017   Procedure: TRANSESOPHAGEAL ECHOCARDIOGRAM (TEE);  Surgeon: Purcell Nails, MD;  Location: [facility] OR;  Service: Open Heart Surgery;  Laterality: N/A;  . TONSILLECTOMY     Family History Family History  Problem Relation Age of Onset  . Heart disease Mother   . Stroke Mother   . Hypertension Mother   . Heart  disease Father   . Heart attack Father   . Brain cancer Brother   . Heart disease Brother   . Melanoma Brother   . Heart attack Brother   . Hypertension Brother   . Ovarian cancer Sister   . Hypertension Sister   . Rheum arthritis Sister   . Colon cancer Neg Hx     Social History Social History   Tobacco Use  . Smoking status: Former Games developer  . Smokeless tobacco: Never Used  . Tobacco comment: quit 1965  Substance Use Topics  . Alcohol use: No  . Drug use: No   Allergies Pepcid [famotidine], Allopurinol, Amlodipine, Atorvastatin, Contrast media [iodinated diagnostic agents], Cephalexin, Ciprofloxacin, Colchicine, Erythromycin, Prilosec [omeprazole], Sulfonamide derivatives, and Tetracycline  Review of Systems Review of Systems  Unable to perform ROS: Mental status change  Endocrine: Negative for heat intolerance.    Physical Exam Vital Signs  I have reviewed the triage vital signs BP (!) 151/84   Pulse (!) 110   Temp (!) 103.3 F (39.6 C) (Rectal)   Resp 20   SpO2 96%   Physical Exam Vitals signs reviewed.  Constitutional:      General: She is not in acute distress.    Appearance: She is well-developed. She is not diaphoretic.  HENT:     Head: Normocephalic and atraumatic.     Nose: Nose normal.  Eyes:     General: No scleral icterus.       Right eye: No discharge.        Left eye: No discharge.     Conjunctiva/sclera: Conjunctivae normal.     Pupils: Pupils are equal, round, and reactive to light.  Neck:     Musculoskeletal: Normal range of motion and neck supple.  Cardiovascular:     Rate and Rhythm: Tachycardia present. Rhythm irregularly irregular.     Heart sounds: No murmur. No friction rub. No gallop.   Pulmonary:     Effort: Pulmonary effort is normal. No respiratory distress.  Breath sounds: Normal breath sounds. No stridor. No rales.  Abdominal:     General: There is no distension.     Palpations: Abdomen is soft.     Tenderness: There  is no abdominal tenderness.  Musculoskeletal:        General: No tenderness.  Skin:    General: Skin is warm and dry.     Findings: No erythema or rash.  Neurological:     Mental Status: She is alert.     Comments: Responds to her name. Does not answer questions or follow command.      ED Results and Treatments Labs (all labs ordered are listed, but only abnormal results are displayed) Labs Reviewed  LACTIC ACID, PLASMA - Abnormal; Notable for the following components:      Result Value   Lactic Acid, Venous 2.6 (*)    All other components within normal limits  LACTIC ACID, PLASMA - Abnormal; Notable for the following components:   Lactic Acid, Venous 2.9 (*)    All other components within normal limits  COMPREHENSIVE METABOLIC PANEL - Abnormal; Notable for the following components:   Glucose, Bld 189 (*)    Creatinine, Ser 1.46 (*)    AST 42 (*)    GFR calc non Af Amer 34 (*)    GFR calc Af Amer 39 (*)    All other components within normal limits  CBC WITH DIFFERENTIAL/PLATELET - Abnormal; Notable for the following components:   WBC 14.8 (*)    Neutro Abs 12.6 (*)    Abs Immature Granulocytes 0.08 (*)    All other components within normal limits  PROTIME-INR - Abnormal; Notable for the following components:   Prothrombin Time 15.3 (*)    All other components within normal limits  URINALYSIS, ROUTINE W REFLEX MICROSCOPIC - Abnormal; Notable for the following components:   Color, Urine STRAW (*)    Glucose, UA 50 (*)    Hgb urine dipstick MODERATE (*)    Ketones, ur 20 (*)    All other components within normal limits  CBG MONITORING, ED - Abnormal; Notable for the following components:   Glucose-Capillary 169 (*)    All other components within normal limits  CULTURE, BLOOD (ROUTINE X 2)  CULTURE, BLOOD (ROUTINE X 2)  URINE CULTURE  SARS CORONAVIRUS 2 (TAT 6-24 HRS)  APTT                                                                                                                          EKG  EKG Interpretation  Date/Time:  Thursday May 07 2019 23:55:37 EDT Ventricular Rate:  113 PR Interval:    QRS Duration: 165 QT Interval:  380 QTC Calculation: 521 R Axis:   -83 Text Interpretation: Ventricular-paced rhythm Ventricular premature complex IVCD, consider atypical RBBB Left ventricular hypertrophy motion artifact Confirmed by Drema Pryardama, Analeigha Nauman [phone]([id number]) on 05/08/2019 12:25:42 AM      Radiology Ct Abdomen Pelvis Wo Contrast  Result Date:  05/08/2019 CLINICAL DATA:  Nausea and vomiting EXAM: CT ABDOMEN AND PELVIS WITHOUT CONTRAST TECHNIQUE: Multidetector CT imaging of the abdomen and pelvis was performed following the standard protocol without IV contrast. COMPARISON:  None. FINDINGS: LOWER CHEST: No basilar pleural or apical pericardial effusion. HEPATOBILIARY: Normal hepatic contours. There is no intra- or extrahepatic biliary dilatation. The gallbladder is normal. PANCREAS: Normal pancreatic contours without pancreatic ductal dilatation or peripancreatic fluid collection. SPLEEN: Normal. ADRENALS/URINARY TRACT: --Adrenal glands: Normal. --Right kidney/ureter: No hydronephrosis, nephroureterolithiasis or solid renal mass. --Left kidney/ureter: No hydronephrosis, nephroureterolithiasis or solid renal mass. --Urinary bladder: Normal for degree of distention STOMACH/BOWEL: --Stomach/Duodenum: There is no hiatal hernia. The duodenal course and caliber are normal. --Small bowel: No dilatation or inflammation. --Colon: No focal abnormality. --Appendix: Normal. VASCULAR/LYMPHATIC: There is calcific aortic atherosclerosis. No abdominal or pelvic lymphadenopathy. REPRODUCTIVE: Status post hysterectomy. No adnexal mass. MUSCULOSKELETAL. No bony spinal canal stenosis or focal osseous abnormality. OTHER: None. IMPRESSION: No acute abdominopelvic abnormality. Aortic atherosclerosis (ICD10-I70.0). Electronically Signed   By: Deatra RobinsonKevin  Herman M.D.   On: 05/08/2019 03:42   Ct  Head Wo Contrast  Result Date: 05/08/2019 CLINICAL DATA:  Altered mental status EXAM: CT HEAD WITHOUT CONTRAST TECHNIQUE: Contiguous axial images were obtained from the base of the skull through the vertex without intravenous contrast. COMPARISON:  12/26/2018 FINDINGS: Brain: Chronic atrophic and ischemic changes are again identified and stable. Encephalomalacia consistent with prior right MCA infarct is noted and stable. Stable lacunar infarcts are noted within the basal ganglia bilaterally. No findings to suggest acute hemorrhage, acute infarction or space-occupying mass lesion are noted. Vascular: No hyperdense vessel or unexpected calcification. Skull: Normal. Negative for fracture or focal lesion. Sinuses/Orbits: No acute finding. Other: None. IMPRESSION: Chronic atrophic and ischemic changes stable from the prior exam. No acute abnormality noted. Electronically Signed   By: Alcide CleverMark  Lukens M.D.   On: 05/08/2019 01:38   Dg Chest Port 1 View  Result Date: 05/08/2019 CLINICAL DATA:  Fever and altered mental status EXAM: PORTABLE CHEST 1 VIEW COMPARISON:  12/26/2018 FINDINGS: The heart size and mediastinal contours are within normal limits. Both lungs are clear. Right pleural effusion has resolved. There is a leftchest wall pacemakerwith leads projecting within the right atrium and right ventricle. The visualized skeletal structures are unremarkable. Left atrial appendage clip and mitral valve prosthesis. IMPRESSION: No active cardiopulmonary disease. Right pleural effusion has resolved. Electronically Signed   By: Deatra RobinsonKevin  Herman M.D.   On: 05/08/2019 00:51    Pertinent labs & imaging results that were available during my care of the patient were reviewed by me and considered in my medical decision making (see chart for details).  Medications Ordered in ED Medications  ceFEPIme (MAXIPIME) 2 g in sodium chloride 0.9 % 100 mL IVPB (has no administration in time range)  vancomycin (VANCOCIN) IVPB 1000  mg/200 mL premix (has no administration in time range)  metroNIDAZOLE (FLAGYL) IVPB 500 mg (0 mg Intravenous Stopped 05/08/19 0220)  vancomycin (VANCOCIN) IVPB 1000 mg/200 mL premix (0 mg Intravenous Stopped 05/08/19 0234)  acetaminophen (TYLENOL) suppository 650 mg (650 mg Rectal Given 05/08/19 0110)  ceFEPIme (MAXIPIME) 2 g in sodium chloride 0.9 % 100 mL IVPB (0 g Intravenous Stopped 05/08/19 0205)  ondansetron (ZOFRAN) injection 4 mg (4 mg Intravenous Given 05/08/19 0223)  sodium chloride 0.9 % bolus 1,000 mL (1,000 mLs Intravenous New Bag/Given 05/08/19 0322)  Procedures .Critical Care Performed by: Fatima Blank, MD Authorized by: Fatima Blank, MD     CRITICAL CARE Performed by: Grayce Sessions Valena Ivanov Total critical care time: 50 minutes Critical care time was exclusive of separately billable procedures and treating other patients. Critical care was necessary to treat or prevent imminent or life-threatening deterioration. Critical care was time spent personally by me on the following activities: development of treatment plan with patient and/or surrogate as well as nursing, discussions with consultants, evaluation of patient's response to treatment, examination of patient, obtaining history from patient or surrogate, ordering and performing treatments and interventions, ordering and review of laboratory studies, ordering and review of radiographic studies, pulse oximetry and re-evaluation of patient's condition.   (including critical care time)  Medical Decision Making / ED Course I have reviewed the nursing notes for this encounter and the patient's prior records (if available in EHR or on provided paperwork).   Amanda [name] was evaluated in Emergency Department on 05/08/2019 for the symptoms described in the history of present  illness. She was evaluated in the context of the global COVID-19 pandemic, which necessitated consideration that the patient might be at risk for infection with the SARS-CoV-2 virus that causes COVID-19. Institutional protocols and algorithms that pertain to the evaluation of patients at risk for COVID-19 are in a state of rapid change based on information released by regulatory bodies including the CDC and federal and state organizations. These policies and algorithms were followed during the patient's care in the ED.  AMS Noted to be febrile. Stable BP. Intermittent atrial fibrillation Code sepsis was initiated and patient was started on empiric antibiotics. Chest x-ray without evidence of pneumonia. UA without evidence of infection. CT head was obtained for altered mental status and possible fall, negative for ICH.  Spoke with family who is now at bedside who reported patient was having vomiting and diarrhea.  Reports eating out at a restaurant earlier in the day. No known sick contacts.  No respiratory symptoms.  CT of the abdomen negative for severe infectious process or bowel obstruction.  Covid pending.  Will admit for further work-up and management.        Final Clinical Impression(s) / ED Diagnoses Final diagnoses:  Sepsis with encephalopathy without septic shock, due to unspecified organism [facility])      This chart was dictated using voice recognition software.  Despite best efforts to proofread,  errors can occur which can change the documentation meaning.   Fatima Blank, MD 05/08/19 [phone]

## 2019-05-08 NOTE — ED Notes (Signed)
Patient transported to CT 

## 2019-05-08 NOTE — ED Notes (Signed)
Critical Lactic acid of 2.6 called by Regino Bellow at [phone]. Told MD Cardama

## 2019-05-08 NOTE — Evaluation (Signed)
Physical Therapy Evaluation Patient Details Name: Amanda [name] MRN: [id number] DOB: 1941-06-15 Today's Date: 05/08/2019   History of Present Illness  Amanda [name] is a 78 y.o. female with medical history significant for dementia, transient confusional episodes, CVA, paroxysmal atrial fibrillation on Eliquis, hypothyroidism, and chronic kidney disease stage III, admitted increased confusion and lethargy with fever and concern for sepsis.  Clinical Impression  Patient presents with decreased independence with mobility due to generalized weakness and imbalance.  Daughter reports she has been a little weaker at home as well trying to adjust medications for her dementia.  She had family to help during the day, but was alone in the night.  Family reports plans for taking her home with 24 hour care and interested in [place] and aide.  PT to follow acutely as well.     Follow Up Recommendations Home health PT;Supervision/Assistance - 24 hour    Equipment Recommendations  None recommended by PT    Recommendations for Other Services       Precautions / Restrictions Precautions Precautions: Fall      Mobility  Bed Mobility Overal bed mobility: Needs Assistance Bed Mobility: Supine to Sit     Supine to sit: Min guard        Transfers Overall transfer level: Needs assistance Equipment used: None Transfers: Sit to/from Stand Sit to Stand: Min guard         General transfer comment: assist for safey up from EOB and from toilet in bathroom cues for rail use and assist for lines  Ambulation/Gait Ambulation/Gait assistance: Min guard;Min assist Gait Distance (Feet): 150 Feet Assistive device: 1 person hand held assist Gait Pattern/deviations: Step-through pattern;Step-to pattern;Wide base of support     General Gait Details: some deficits noted with balance and strength, pt reaching for UE support so offered HHA for hallway ambulation  Stairs             Wheelchair Mobility    Modified Rankin (Stroke Patients Only)       Balance Overall balance assessment: Needs assistance   Sitting balance-Leahy Scale: Good       Standing balance-Leahy Scale: Poor Standing balance comment: minguard provided at least for balance                             Pertinent Vitals/Pain Pain Assessment: No/denies pain    Home Living Family/patient expects to be discharged to:: Private residence Living Arrangements: Alone Available Help at Discharge: Family;Available PRN/intermittently Type of Home: House Home Access: Stairs to enter   Entrance Stairs-Number of Steps: 2 Home Layout: One level Home Equipment: None Additional Comments: was staying alone at night, daughters or sister there during the day due to confusion    Prior Function Level of Independence: Independent               Hand Dominance        Extremity/Trunk Assessment   Upper Extremity Assessment Upper Extremity Assessment: Generalized weakness    Lower Extremity Assessment Lower Extremity Assessment: Generalized weakness       Communication   Communication: No difficulties  Cognition Arousal/Alertness: Awake/alert Behavior During Therapy: WFL for tasks assessed/performed Overall Cognitive Status: Impaired/Different from baseline Area of Impairment: Orientation;Memory;Safety/judgement;Attention                 Orientation Level: Disoriented to;Place;Situation;Time Current Attention Level: Sustained Memory: Decreased short-term memory   Safety/Judgement: Decreased awareness of safety;Decreased awareness of  deficits     General Comments: some confusion at baseline, but worse in hospital with current issues per daughter      General Comments General comments (skin integrity, edema, etc.): Daughter, Amanda Asp, in the room and reports plans to increased assist at home and wanting HHPT    Exercises     Assessment/Plan    PT Assessment  Patient needs continued PT services  PT Problem List Decreased strength;Decreased balance;Decreased activity tolerance;Decreased mobility;Decreased cognition;Decreased safety awareness       PT Treatment Interventions DME instruction;Therapeutic activities;Balance training;Stair training;Gait training;Therapeutic exercise;Functional mobility training;Patient/family education    PT Goals (Current goals can be found in the Care Plan section)  Acute Rehab PT Goals Patient Stated Goal: to take pt home PT Goal Formulation: With patient/family Time For Goal Achievement: 05/22/19 Potential to Achieve Goals: Good    Frequency Min 3X/week   Barriers to discharge        Co-evaluation               AM-PAC PT "6 Clicks" Mobility  Outcome Measure Help needed turning from your back to your side while in a flat bed without using bedrails?: A Little Help needed moving from lying on your back to sitting on the side of a flat bed without using bedrails?: A Little Help needed moving to and from a bed to a chair (including a wheelchair)?: A Little Help needed standing up from a chair using your arms (e.g., wheelchair or bedside chair)?: A Little Help needed to walk in hospital room?: A Little Help needed climbing 3-5 steps with a railing? : A Little 6 Click Score: 18    End of Session   Activity Tolerance: Patient tolerated treatment well Patient left: in bed   PT Visit Diagnosis: Other abnormalities of gait and mobility (R26.89);Other symptoms and signs involving the nervous system (R29.898)    Time: 1229-1300 PT Time Calculation (min) (ACUTE ONLY): 31 min   Charges:   PT Evaluation $PT Eval Low Complexity: 1 Low PT Treatments $Gait Training: 8-22 mins        Amanda [name], Amanda [name] Acute Rehabilitation Services [phone] 05/08/2019   Amanda [name] 05/08/2019, 1:45 PM

## 2019-05-08 NOTE — Evaluation (Signed)
Clinical/Bedside Swallow Evaluation Patient Details  Name: Amanda [name] MRN: [id number] Date of Birth: [birth year]  Today's Date: 05/08/2019 Time: SLP Start Time (ACUTE ONLY): 1455 SLP Stop Time (ACUTE ONLY): 1516 SLP Time Calculation (min) (ACUTE ONLY): 21 min  Past Medical History:  Past Medical History:  Diagnosis Date  . Allergic rhinitis   . Anemia   . Asymptomatic LV dysfunction    EF 45-50% echo 07/2017  . CKD (chronic kidney disease), stage III    stage III  . DJD (degenerative joint disease)   . Glomerulonephritis    Dr [name]  . Hyperlipidemia   . Hyperparathyroidism (HCC)   . Hypertension   . Hypothyroidism   . IBS (irritable bowel syndrome)   . Interstitial cystitis   . Mitral regurgitation   . Mobitz II    a. s/p STJ dual chamber PPM   . MVP (mitral valve prolapse)    moderate posterior MVP with moderate MR and grade II diasotlic dysfunction  . PAC (premature atrial contraction)   . PAF (paroxysmal atrial fibrillation) (HCC)     note on pacer check. CHADS2VASC score is 4 now on Eliquis.  [name] PVC's (premature ventricular contractions)   . S/P minimally invasive maze operation for atrial fibrillation 04/16/2017   Complete bilateral atrial lesion set using cryothermy and bipolar radiofrequency ablation with clipping of LA appendage via right mini thoracotomy approach  . S/P minimally invasive mitral valve repair 04/16/2017   Complex valvuloplasty including triangular resection of posterior leaflet, artificial Gore-tex neochords x6 and Sorin Memo 3D ring annuloplasty ([id number], size 32, serial # [id number])  . S/P placement of cardiac pacemaker   . Small vessel disease, cerebrovascular   . Stroke [facility])    a. old stroke seen on imaging.   Past Surgical History:  Past Surgical History:  Procedure Laterality Date  . ABDOMINAL HYSTERECTOMY    . BREAST BIOPSY Right   . CHEST TUBE INSERTION Right 05/23/2017   Procedure: INSERTION PLEURAL DRAINAGE CATHETER;  Surgeon:  Purcell Nails, MD;  Location: [facility] OR;  Service: Thoracic;  Laterality: Right;  possible pleurex catheter  . CLIPPING OF ATRIAL APPENDAGE  04/16/2017   Procedure: CLIPPING OF ATRIAL APPENDAGE using AtriCure Pro2 clip 45;  Surgeon: Purcell Nails, MD;  Location: [facility] OR;  Service: Open Heart Surgery;;  . CYSTOSTOMY W/ BLADDER BIOPSY    . EP IMPLANTABLE DEVICE N/A 01/07/2015   STJ dual chamber pacemaker implanted by Dr [name] for 2:1 heart block  . MINIMALLY INVASIVE MAZE PROCEDURE N/A 04/16/2017   Procedure: MINIMALLY INVASIVE MAZE PROCEDURE;  Surgeon: Purcell Nails, MD;  Location[facility] OR;  Service: Open Heart Surgery;  Laterality: N/A;  . MITRAL VALVE REPAIR Right 04/16/2017   Procedure: MINIMALLY INVASIVE MITRAL VALVE REPAIR (MVR);  Surgeon: Purcell Nails, MD;  Location: [facility] OR;  Service: Open Heart Surgery;  Laterality: Right;  . MITRAL VALVE REPAIR Right 04/16/2017   Procedure: RE-EXPLORATION RIGHT THORACOTOMY FOR BLEEDING;  Surgeon: Purcell Nails, MD;  Location: [facility] OR;  Service: Open Heart Surgery;  Laterality: Right;  . PERIPHERAL VASCULAR CATHETERIZATION N/A 02/03/2015   Procedure: Upper Extremity Venography;  Surgeon: [name], MD;  Location: [facility] INVASIVE CV LAB;  Service: Cardiovascular;  Laterality: N/A;  . REMOVAL OF PLEURAL DRAINAGE CATHETER Right 09/03/2017   Procedure: REMOVAL OF PLEURAL DRAINAGE CATHETER;  Surgeon: Purcell Nails, MD;  Location: [facility] OR;  Service: Thoracic;  Laterality: Right;  . RIGHT/LEFT HEART CATH AND CORONARY ANGIOGRAPHY N/A 03/26/2017  Procedure: RIGHT/LEFT HEART CATH AND CORONARY ANGIOGRAPHY;  Surgeon: [name], MD;  Location: [facility] INVASIVE CV LAB;  Service: Cardiovascular;  Laterality: N/A;  . TEE WITHOUT CARDIOVERSION N/A 03/18/2017   Procedure: TRANSESOPHAGEAL ECHOCARDIOGRAM (TEE);  Surgeon: [name], MD;  Location: [facility] ENDOSCOPY;  Service: Cardiovascular;  Laterality: N/A;  . TEE WITHOUT CARDIOVERSION N/A 04/16/2017   Procedure:  TRANSESOPHAGEAL ECHOCARDIOGRAM (TEE);  Surgeon: Purcell Nails, MD;  Location: [facility] OR;  Service: Open Heart Surgery;  Laterality: N/A;  . TONSILLECTOMY     HPI:  Amanda [name] is a 78 y.o. female with medical history significant for dementia, transient confusional episodes, CVA, paroxysmal atrial fibrillation on Eliquis, hypothyroidism, and chronic kidney disease stage III and esophageal dysmotility and stricture, s/p balloon dilation (02/19/17).  Pt presented to the emergency department for evaluation of increased confusion and lethargy.  Pt has known hx of dysphagia in 2018 with recommendations for dysphagia 3 (soft) solids and thin liquids; however, daughter reported that pt has been tolerating regular solids without difficulty since then.    Assessment / Plan / Recommendation Clinical Impression  Pt was seen for a bedside swallow evaluation and she presents with suspected functional oropharyngeal swallowing abilities.  She was encountered awake/alert with daughter at bedside.  Pt was observed with trials of thin liquid, puree, and regular solids.  She exhibited good bolus acceptance, timely mastication, and timely AP transport with all trials.  Pt presented with a delayed throat clear following 1/3 puree trials; however,  suspect esophageal etiology vs pharyngeal impairment secondary to hx of esophageal dysmotility and dilation.  No additional clinical s/sx of aspiration were observed with any trials despite challenging. Recommend continuation of regular solids and thin liquid with the following precautions: 1) Small bites/sips 2) Slow rate of intake 3) Sit upright as possible.  Pt and daughter were educated regarding all recommendations and they verbalized understanding.  No further skilled ST is warranted at this time.  Please re-consult if additional needs arise.   SLP Visit Diagnosis: Dysphagia, unspecified (R13.10)    Aspiration Risk  No limitations    Diet Recommendation Thin  liquid;Regular   Liquid Administration via: Cup;Straw Medication Administration: Whole meds with puree Supervision: Intermittent supervision to cue for compensatory strategies Compensations: Minimize environmental distractions;Slow rate;Small sips/bites Postural Changes: Seated upright at 90 degrees;Remain upright for at least 30 minutes after po intake    Other  Recommendations Recommended Consults: Consider GI evaluation Oral Care Recommendations: Oral care BID;Staff/trained caregiver to provide oral care   Follow up Recommendations None      Frequency and Duration            Prognosis        Swallow Study   General HPI: AERALYN BARNA is a 78 y.o. female with medical history significant for dementia, transient confusional episodes, CVA, paroxysmal atrial fibrillation on Eliquis, hypothyroidism, and chronic kidney disease stage III and esophageal dysmotility and stricture, s/p balloon dilation (02/19/17).  Pt presented to the emergency department for evaluation of increased confusion and lethargy.  Pt has known hx of dysphagia in 2018 with recommendations for dysphagia 3 (soft) solids and thin liquids; however, daughter reported that pt has been tolerating regular solids without difficulty since then.  Type of Study: Bedside Swallow Evaluation Previous Swallow Assessment: Hx of dysphagia in 2018 with most recent recommendations for Dysphagia 3 (soft) solids and thin liquid.  Diet Prior to this Study: Regular;Thin liquids Temperature Spikes Noted: Yes Respiratory Status: Room air History of  Recent Intubation: No Behavior/Cognition: Alert;Cooperative;Pleasant mood;Confused Oral Cavity Assessment: Within Functional Limits Oral Care Completed by SLP: No Oral Cavity - Dentition: Adequate natural dentition Vision: Functional for self-feeding Self-Feeding Abilities: Able to feed self;Needs set up Patient Positioning: Upright in chair Baseline Vocal Quality: Normal Volitional  Swallow: Able to elicit    Oral/Motor/Sensory Function Overall Oral Motor/Sensory Function: Within functional limits   Ice Chips Ice chips: Within functional limits Presentation: Spoon   Thin Liquid Thin Liquid: Within functional limits Presentation: Cup;Straw    Nectar Thick Nectar Thick Liquid: Not tested   Honey Thick Honey Thick Liquid: Not tested   Puree Puree: Impaired Presentation: Spoon Pharyngeal Phase Impairments: Throat Clearing - Delayed   Solid     Solid: Within functional limits Presentation: Self Fed     Colin Mulders M.S., CCC-SLP Acute Rehabilitation Services Office: [phone]  Elvia Collum Laytoya Ion 05/08/2019,3:37 PM

## 2019-05-08 NOTE — Progress Notes (Signed)
Pharmacy Antibiotic Note  Amanda [name] is a 78 y.o. female admitted on 05/07/2019 with sepsis.  Pharmacy has been consulted for vancomycin and aztreonam dosing. She has received multiple cephalosporins so will change to cefepime  Plan: Vancomycin 1gm IV q36 hours Cefepime 2gm IV q24 hours F/u renal function, cultures and clinical course     Temp (24hrs), Avg:101.6 F (38.7 C), Min:101.6 F (38.7 C), Max:101.6 F (38.7 C)  Recent Labs  Lab 05/08/19 0003  WBC 14.8*    CrCl cannot be calculated (Unknown ideal weight.).    Allergies  Allergen Reactions  . Pepcid [Famotidine] Anaphylaxis, Swelling and Other (See Comments)    Causes "Throat Swelling"   . Allopurinol Other (See Comments)    Caused headaches  . Amlodipine Other (See Comments)    Results in PEDAL EDEMA   . Atorvastatin Other (See Comments)    Causes her leg muscles to ache  . Contrast Media [Iodinated Diagnostic Agents] Hives    IVP dye per patient  . Cephalexin Itching and Rash  . Ciprofloxacin Itching and Rash  . Colchicine Rash  . Erythromycin Itching and Rash  . Prilosec [Omeprazole] Nausea Only  . Sulfonamide Derivatives Itching and Rash  . Tetracycline Itching and Rash    Thank you for allowing pharmacy to be a part of this patient's care.  Carle Fenech, [place] 05/08/2019 12:51 AM

## 2019-05-09 DIAGNOSIS — R6510 Systemic inflammatory response syndrome (SIRS) of non-infectious origin without acute organ dysfunction: Secondary | ICD-10-CM | POA: Diagnosis not present

## 2019-05-09 LAB — CBC WITH DIFFERENTIAL/PLATELET
Abs Immature Granulocytes: 0.04 10*3/uL (ref 0.00–0.07)
Basophils Absolute: 0.1 10*3/uL (ref 0.0–0.1)
Basophils Relative: 1 %
Eosinophils Absolute: 0.3 10*3/uL (ref 0.0–0.5)
Eosinophils Relative: 3 %
HCT: 31.5 % — ABNORMAL LOW (ref 36.0–46.0)
Hemoglobin: 10.7 g/dL — ABNORMAL LOW (ref 12.0–15.0)
Immature Granulocytes: 1 %
Lymphocytes Relative: 22 %
Lymphs Abs: 1.9 10*3/uL (ref 0.7–4.0)
MCH: 31 pg (ref 26.0–34.0)
MCHC: 34 g/dL (ref 30.0–36.0)
MCV: 91.3 fL (ref 80.0–100.0)
Monocytes Absolute: 1 10*3/uL (ref 0.1–1.0)
Monocytes Relative: 11 %
Neutro Abs: 5.3 10*3/uL (ref 1.7–7.7)
Neutrophils Relative %: 62 %
Platelets: 193 10*3/uL (ref 150–400)
RBC: 3.45 MIL/uL — ABNORMAL LOW (ref 3.87–5.11)
RDW: 12.9 % (ref 11.5–15.5)
WBC: 8.6 10*3/uL (ref 4.0–10.5)
nRBC: 0 % (ref 0.0–0.2)

## 2019-05-09 LAB — BASIC METABOLIC PANEL
Anion gap: 12 (ref 5–15)
BUN: 24 mg/dL — ABNORMAL HIGH (ref 8–23)
CO2: 25 mmol/L (ref 22–32)
Calcium: 9.3 mg/dL (ref 8.9–10.3)
Chloride: 101 mmol/L (ref 98–111)
Creatinine, Ser: 1.75 mg/dL — ABNORMAL HIGH (ref 0.44–1.00)
GFR calc Af Amer: 31 mL/min — ABNORMAL LOW (ref >60)
GFR calc non Af Amer: 27 mL/min — ABNORMAL LOW (ref >60)
Glucose, Bld: 110 mg/dL — ABNORMAL HIGH (ref 70–99)
Potassium: 3.2 mmol/L — ABNORMAL LOW (ref 3.5–5.1)
Sodium: 138 mmol/L (ref 135–145)

## 2019-05-09 LAB — GLUCOSE, CAPILLARY: Glucose-Capillary: 100 mg/dL — ABNORMAL HIGH (ref 70–99)

## 2019-05-09 MED ORDER — VITAMIN B-12 100 MCG PO TABS: 100.0000 ug | ORAL_TABLET | ORAL | Status: DC

## 2019-05-09 MED ORDER — POTASSIUM CHLORIDE IN NACL 20-0.9 MEQ/L-% IV SOLN
INTRAVENOUS | Status: DC
  Administered 2019-05-09 – 2019-05-10 (×3): via INTRAVENOUS
  Filled 2019-05-09 (×2): qty 1000

## 2019-05-09 MED ORDER — POTASSIUM CHLORIDE CRYS ER 20 MEQ PO TBCR
40.0000 meq | EXTENDED_RELEASE_TABLET | Freq: Once | ORAL | Status: AC
  Administered 2019-05-09: 40 meq via ORAL
  Filled 2019-05-09: qty 2

## 2019-05-09 MED ORDER — PANTOPRAZOLE SODIUM 40 MG PO TBEC
40.0000 mg | DELAYED_RELEASE_TABLET | Freq: Every day | ORAL | Status: DC
  Administered 2019-05-10: 09:00:00 40 mg via ORAL
  Filled 2019-05-09: qty 1

## 2019-05-09 MED ORDER — SODIUM CHLORIDE 0.9 % IV SOLN: INTRAVENOUS | Status: DC

## 2019-05-09 NOTE — Progress Notes (Addendum)
PROGRESS NOTE    Amanda [name]  DDU:[id number] DOB: 07-28-1940 DOA: 05/07/2019 PCP: Cari Caraway, MD   Brief Narrative:  Amanda [name] is a 78 y.o. female with medical history significant for dementia, transient confusional episodes, CVA, paroxysmal atrial fibrillation on Eliquis, hypothyroidism, and chronic kidney disease stage III presented to ED on 05/07/2019 for evaluation of increased confusion and lethargy.    Daughter noted that patient was more confused than usual for the past week.  She suspected patient may have a UTI, dropped a urine sample off at the lab but had not gotten the results yet.  Family helped the patient to the restroom last night but shortly after this found her completely disoriented and sitting on the bathroom floor. There was one episode each of vomiting and loose stool.  Patient has not complained of anything recently.  Upon arrival to the ED, patient is found to be febrile with temperature of 102.344F, saturating mid 90s on room air, tachycardic to the 120s, and with stable blood pressure.  EKG featured a ventricularly paced rhythm.  Chest x-ray was negative for acute cardiopulmonary disease.  Noncontrast head CT was negative for acute intracranial abnormality.  CT of the abdomen and pelvis is negative for acute findings.  Chemistry panel is notable for glucose 189 and creatinine 1.46, up from an apparent baseline of 1.3.  CBC features a leukocytosis to 14,800.  Mild lactic acidosis.  Covid testing negative.   UA was unremarkable.  She was admitted under hospital service with Sirs/sepsis with unknown source.  She was started on cefepime and vancomycin.  Assessment & Plan:   Principal Problem:   SIRS (systemic inflammatory response syndrome) (HCC) Active Problems:   Acute encephalopathy   Hypothyroidism   CKD (chronic kidney disease), stage III   Paroxysmal atrial fibrillation (HCC)   History of CVA (cerebrovascular accident)   Dementia ([place])   Acute  metabolic encephalopathy  SIRS/sepsis, source unknown/febrile illness: Patient is feeling much better today and overall her hemodynamics are also much better.  She is no more tachycardic, tachypneic.  Her blood pressure meds are stable.  She has remained afebrile.  Last temperature was at 3 AM on 05/08/2019 which was 103.344F.  UA, chest x-ray, CT abdomen and pelvis all are negative.  Blood cultures negative so far.  I doubt MRSA.  Since the source is still remains unclear however she had very high-grade fever so I would prefer to keep her in the hospital for another 24 hours observation and wait for final blood culture reports.  It has been only 36 hours since her blood culture were drawn.  By tomorrow we will be between 2 and 3 days which will be more appropriate time frame for the blood cultures to be finalized.  Will discontinue vancomycin but continue Zosyn for now.  Acute metabolic encephalopathy: Likely secondary to febrile illness.  She is doing so much better today.  She is alert and oriented x3.  She knew she was at hospital but I had to remind her she was at [facility].  She remembers the month of October, year 2020 and current president Trump.  She also remembers that she has already voted.  I have verified from her daughter Jenny Reichmann who said that this is her baseline.  Paroxysmal atrial fibrillation: In sinus rhythm.  Continue Eliquis.  AKI on CKD stage III: Her baseline creatinine is around 1.3.  Currently 1.75.  We will start her on gentle hydration with normal saline at 75  cc for next 12 hours.  Repeat labs in the morning.  Hypothyroidism: Continue Synthroid.  Dementia: Noted.  Recently stopped Aricept.  Hypokalemia: We will replace orally and recheck in the morning.  DVT prophylaxis: Eliquis Code Status: Full code Family Communication:  None present at bedside.  Called patient's daughter Arline Asp and updated about current plan. Disposition Plan: Potential discharge tomorrow.  Estimated  body mass index is 19.44 kg/m as calculated from the following:   Height as of this encounter: 5\' 7"  (1.702 m).   Weight as of this encounter: 56.3 kg.      Nutritional status:               Consultants:   None  Procedures:   None  Antimicrobials:   IV cefepime 05/07/2019> 05/08/2019  IV vancomycin 05/07/2019>  IV Zosyn 05/08/2019>   Subjective: Patient seen and examined.  She is alert and oriented x3.  Does not have any complaint.  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  Objective: Vitals:   05/08/19 2100 05/09/19 0024 05/09/19 0340 05/09/19 0839  BP: 120/60 122/64 136/69 (!) 159/80  Pulse: 92 90 83 79  Resp:  16 18 15   Temp: 98 F (36.7 C) 98.3 F (36.8 C) (!) 97.5 F (36.4 C) 97.9 F (36.6 C)  TempSrc: Oral Oral Oral Oral  SpO2: 96% 98% 98% 98%  Weight:   56.3 kg   Height:        Intake/Output Summary (Last 24 hours) at 05/09/2019 1304 Last data filed at 05/09/2019 0600 Gross per 24 hour  Intake 150 ml  Output -  Net 150 ml   Filed Weights   05/08/19 0602 05/09/19 0340  Weight: 57.1 kg 56.3 kg    Examination:  General exam: Appears calm and comfortable  Respiratory system: Clear to auscultation. Respiratory effort normal. Cardiovascular system: S1 & S2 heard, RRR. No JVD, murmurs, rubs, gallops or clicks. No pedal edema. Gastrointestinal system: Abdomen is nondistended, soft and nontender. No organomegaly or masses felt. Normal bowel sounds heard. Central nervous system: Alert and oriented x3. No focal neurological deficits. Extremities: Symmetric 5 x 5 power. Skin: No rashes, lesions or ulcers Psychiatry: Judgement and insight appear normal. Mood & affect appropriate.    Data Reviewed: I have personally reviewed following labs and imaging studies  CBC: Recent Labs  Lab 05/08/19 0003 05/09/19 0322  WBC 14.8* 8.6  NEUTROABS 12.6* 5.3  HGB 13.3 10.7*  HCT 41.5 31.5*   MCV 97.0 91.3  PLT 255 193   Basic Metabolic Panel: Recent Labs  Lab 05/08/19 0003 05/09/19 0322  NA 138 138  K 3.7 3.2*  CL 99 101  CO2 25 25  GLUCOSE 189* 110*  BUN 19 24*  CREATININE 1.46* 1.75*  CALCIUM 10.0 9.3   GFR: Estimated Creatinine Clearance: 22.8 mL/min (A) (by C-G formula based on SCr of 1.75 mg/dL (H)). Liver Function Tests: Recent Labs  Lab 05/08/19 0003  AST 42*  ALT 29  ALKPHOS 70  BILITOT 1.0  PROT 6.8  ALBUMIN 4.0   No results for input(s): LIPASE, AMYLASE in the last 168 hours. No results for input(s): AMMONIA in the last 168 hours. Coagulation Profile: Recent Labs  Lab 05/08/19 0003  INR 1.2   Cardiac Enzymes: No results for input(s): CKTOTAL, CKMB, CKMBINDEX, TROPONINI in the last 168 hours. BNP (last 3 results) No results for input(s): PROBNP in the last 8760  hours. HbA1C: No results for input(s): HGBA1C in the last 72 hours. CBG: Recent Labs  Lab 05/07/19 2359 05/08/19 0925 05/09/19 0622  GLUCAP 169* 117* 100*   Lipid Profile: No results for input(s): CHOL, HDL, LDLCALC, TRIG, CHOLHDL, LDLDIRECT in the last 72 hours. Thyroid Function Tests: No results for input(s): TSH, T4TOTAL, FREET4, T3FREE, THYROIDAB in the last 72 hours. Anemia Panel: No results for input(s): VITAMINB12, FOLATE, FERRITIN, TIBC, IRON, RETICCTPCT in the last 72 hours. Sepsis Labs: Recent Labs  Lab 05/08/19 0003 05/08/19 0210 05/08/19 0703 05/08/19 0945  PROCALCITON  --   --   --  <0.10  LATICACIDVEN 2.6* 2.9* 2.5* 1.9    Recent Results (from the past 240 hour(s))  Urine culture     Status: None   Collection Time: 05/08/19 12:03 AM   Specimen: In/Out Cath Urine  Result Value Ref Range Status   Specimen Description IN/OUT CATH URINE  Final   Special Requests NONE  Final   Culture   Final    NO GROWTH Performed at [facility], [address] [place], [place] [address]    Report Status 05/08/2019 FINAL  Final  SARS CORONAVIRUS 2 (TAT  6-24 HRS) Nasopharyngeal Nasopharyngeal Swab     Status: None   Collection Time: 05/08/19 12:22 AM   Specimen: Nasopharyngeal Swab  Result Value Ref Range Status   SARS Coronavirus 2 NEGATIVE NEGATIVE Final    Comment: (NOTE) SARS-CoV-2 target nucleic acids are NOT DETECTED. The SARS-CoV-2 RNA is generally detectable in upper and lower respiratory specimens during the acute phase of infection. Negative results do not preclude SARS-CoV-2 infection, do not rule out co-infections with other pathogens, and should not be used as the sole basis for treatment or other patient management decisions. Negative results must be combined with clinical observations, patient history, and epidemiological information. The expected result is Negative. Fact Sheet for Patients: HairSlick.no Fact Sheet for Healthcare Providers: quierodirigir.com This test is not yet approved or cleared by the [place] FDA and  has been authorized for detection and/or diagnosis of SARS-CoV-2 by FDA under an Emergency Use Authorization (EUA). This EUA will remain  in effect (meaning this test can be used) for the duration of the COVID-19 declaration under Section 56 4(b)(1) of the Act, 21 U.S.C. section 360bbb-3(b)(1), unless the authorization is terminated or revoked sooner. Performed at [facility], [address], [place], [place] [address]   Blood Culture (routine x 2)     Status: None (Preliminary result)   Collection Time: 05/08/19 12:30 AM   Specimen: BLOOD  Result Value Ref Range Status   Specimen Description BLOOD LEFT ANTECUBITAL  Final   Special Requests   Final    BOTTLES DRAWN AEROBIC AND ANAEROBIC Blood Culture adequate volume   Culture   Final    NO GROWTH 1 DAY Performed at [facility], [address], [place], [place] [address]    Report Status PENDING  Incomplete  Blood Culture (routine x 2)     Status: None (Preliminary  result)   Collection Time: 05/08/19 12:43 AM   Specimen: BLOOD  Result Value Ref Range Status   Specimen Description BLOOD RIGHT ANTECUBITAL  Final   Special Requests   Final    BOTTLES DRAWN AEROBIC AND ANAEROBIC Blood Culture adequate volume   Culture   Final    NO GROWTH 1 DAY Performed at [facility], [address] [place], [place] [address]  Report Status PENDING  Incomplete      Radiology Studies: Ct Abdomen Pelvis Wo Contrast  Result Date: 05/08/2019 CLINICAL DATA:  Nausea and vomiting EXAM: CT ABDOMEN AND PELVIS WITHOUT CONTRAST TECHNIQUE: Multidetector CT imaging of the abdomen and pelvis was performed following the standard protocol without IV contrast. COMPARISON:  None. FINDINGS: LOWER CHEST: No basilar pleural or apical pericardial effusion. HEPATOBILIARY: Normal hepatic contours. There is no intra- or extrahepatic biliary dilatation. The gallbladder is normal. PANCREAS: Normal pancreatic contours without pancreatic ductal dilatation or peripancreatic fluid collection. SPLEEN: Normal. ADRENALS/URINARY TRACT: --Adrenal glands: Normal. --Right kidney/ureter: No hydronephrosis, nephroureterolithiasis or solid renal mass. --Left kidney/ureter: No hydronephrosis, nephroureterolithiasis or solid renal mass. --Urinary bladder: Normal for degree of distention STOMACH/BOWEL: --Stomach/Duodenum: There is no hiatal hernia. The duodenal course and caliber are normal. --Small bowel: No dilatation or inflammation. --Colon: No focal abnormality. --Appendix: Normal. VASCULAR/LYMPHATIC: There is calcific aortic atherosclerosis. No abdominal or pelvic lymphadenopathy. REPRODUCTIVE: Status post hysterectomy. No adnexal mass. MUSCULOSKELETAL. No bony spinal canal stenosis or focal osseous abnormality. OTHER: None. IMPRESSION: No acute abdominopelvic abnormality. Aortic atherosclerosis (ICD10-I70.0). Electronically Signed   By: Deatra RobinsonKevin  Herman M.D.   On: 05/08/2019 03:42   Ct Head Wo  Contrast  Result Date: 05/08/2019 CLINICAL DATA:  Altered mental status EXAM: CT HEAD WITHOUT CONTRAST TECHNIQUE: Contiguous axial images were obtained from the base of the skull through the vertex without intravenous contrast. COMPARISON:  12/26/2018 FINDINGS: Brain: Chronic atrophic and ischemic changes are again identified and stable. Encephalomalacia consistent with prior right MCA infarct is noted and stable. Stable lacunar infarcts are noted within the basal ganglia bilaterally. No findings to suggest acute hemorrhage, acute infarction or space-occupying mass lesion are noted. Vascular: No hyperdense vessel or unexpected calcification. Skull: Normal. Negative for fracture or focal lesion. Sinuses/Orbits: No acute finding. Other: None. IMPRESSION: Chronic atrophic and ischemic changes stable from the prior exam. No acute abnormality noted. Electronically Signed   By: Alcide CleverMark  Lukens M.D.   On: 05/08/2019 01:38   Dg Chest Port 1 View  Result Date: 05/08/2019 CLINICAL DATA:  Fever and altered mental status EXAM: PORTABLE CHEST 1 VIEW COMPARISON:  12/26/2018 FINDINGS: The heart size and mediastinal contours are within normal limits. Both lungs are clear. Right pleural effusion has resolved. There is a leftchest wall pacemakerwith leads projecting within the right atrium and right ventricle. The visualized skeletal structures are unremarkable. Left atrial appendage clip and mitral valve prosthesis. IMPRESSION: No active cardiopulmonary disease. Right pleural effusion has resolved. Electronically Signed   By: Deatra RobinsonKevin  Herman M.D.   On: 05/08/2019 00:51    Scheduled Meds: . apixaban  2.5 mg Oral BID  . levothyroxine  75 mcg Oral Q0600  . [START ON 05/10/2019] pantoprazole  40 mg Oral Daily  . sodium chloride flush  3 mL Intravenous Q12H  . sodium chloride flush  3 mL Intravenous Q12H   Continuous Infusions: . sodium chloride    . 0.9 % NaCl with KCl 20 mEq / L    . piperacillin-tazobactam (ZOSYN)  IV  3.375 g (05/08/19 2214)     LOS: 1 day   Time spent: 31 minutes   Hughie Clossavi Dejane Scheibe, MD [facility]  05/09/2019, 1:04 PM   To contact the attending provider between 7A-7P or the covering provider during after hours 7P-7A, please log into the web site [email or web address] and use password [email or web address].

## 2019-05-10 LAB — CBC WITH DIFFERENTIAL/PLATELET
Abs Immature Granulocytes: 0.03 10*3/uL (ref 0.00–0.07)
Basophils Absolute: 0.1 10*3/uL (ref 0.0–0.1)
Basophils Relative: 1 %
Eosinophils Absolute: 0.3 10*3/uL (ref 0.0–0.5)
Eosinophils Relative: 3 %
HCT: 36.4 % (ref 36.0–46.0)
Hemoglobin: 12.1 g/dL (ref 12.0–15.0)
Immature Granulocytes: 0 %
Lymphocytes Relative: 29 %
Lymphs Abs: 2.5 10*3/uL (ref 0.7–4.0)
MCH: 30.8 pg (ref 26.0–34.0)
MCHC: 33.2 g/dL (ref 30.0–36.0)
MCV: 92.6 fL (ref 80.0–100.0)
Monocytes Absolute: 0.8 10*3/uL (ref 0.1–1.0)
Monocytes Relative: 9 %
Neutro Abs: 5 10*3/uL (ref 1.7–7.7)
Neutrophils Relative %: 58 %
Platelets: 223 10*3/uL (ref 150–400)
RBC: 3.93 MIL/uL (ref 3.87–5.11)
RDW: 12.9 % (ref 11.5–15.5)
WBC: 8.7 10*3/uL (ref 4.0–10.5)
nRBC: 0 % (ref 0.0–0.2)

## 2019-05-10 LAB — BASIC METABOLIC PANEL
Anion gap: 8 (ref 5–15)
BUN: 18 mg/dL (ref 8–23)
CO2: 25 mmol/L (ref 22–32)
Calcium: 9.5 mg/dL (ref 8.9–10.3)
Chloride: 110 mmol/L (ref 98–111)
Creatinine, Ser: 1.67 mg/dL — ABNORMAL HIGH (ref 0.44–1.00)
GFR calc Af Amer: 33 mL/min — ABNORMAL LOW (ref >60)
GFR calc non Af Amer: 29 mL/min — ABNORMAL LOW (ref >60)
Glucose, Bld: 96 mg/dL (ref 70–99)
Potassium: 3.8 mmol/L (ref 3.5–5.1)
Sodium: 143 mmol/L (ref 135–145)

## 2019-05-10 MED ORDER — AMOXICILLIN-POT CLAVULANATE 875-125 MG PO TABS: 1.0000 | ORAL_TABLET | Freq: Two times a day (BID) | ORAL | 0 refills | Status: AC

## 2019-05-10 NOTE — Discharge Summary (Signed)
Physician Discharge Summary  Peggyann ShoalsLoretta A Tuazon WJX:[id number] DOB: 06/17/1941 DOA: 05/07/2019  PCP: Gweneth DimitriMcNeill, Wendy, MD  Admit date: 05/07/2019 Discharge date: 05/10/2019  Admitted From: Home Disposition: Home  Recommendations for Outpatient Follow-up:  1. Follow up with PCP in 1-2 weeks 2. Please obtain BMP/CBC in one week 3. Please follow up on the following pending results:  Home Health: Yes Equipment/Devices: Shower seat  Discharge Condition: Stable CODE STATUS: Full code Diet recommendation: Cardiac  Subjective: Patient seen and examined.  Sitting in the chair.  Completely alert and oriented x3, at her baseline.  She has no complaints.  Brief/Interim Summary: Amanda LazierLoretta A Eichhornis a 78 y.o.femalewith medical history significant fordementia, transient confusional episodes, CVA, paroxysmal atrial fibrillation on Eliquis, hypothyroidism, and chronic kidney disease stage III presented to ED on 05/07/2019 for evaluation of increased confusion and lethargy.  Daughter noted that patient was more confused than usual for the past week.  She suspected patient may have a UTI, dropped a urine sample off at the lab but had not gotten the results yet. Family helped the patient to the restroom last night but shortly after this found her completely disoriented and sitting on the bathroom floor. There was one episode each of vomiting and loose stool.   Upon arrival to the ED, patient is found to be febrile with temperature of 102.72F, saturating mid 90s on room air, tachycardic to the 120s, and with stable blood pressure. EKG featured a ventricularly paced rhythm. Chest x-ray was negative for acute cardiopulmonary disease. Noncontrast head CT was negative for acute intracranial abnormality. CT of the abdomen and pelvis was negative for acute findings. Chemistry panel was notable for glucose 189 and creatinine 1.46, up from an apparent baseline of 1.3. CBC features a leukocytosis to 14,800.   Mild lactic acidosis.  Covid testing negative.  UA was unremarkable.  She was admitted under hospital service with Sirs/sepsis with unknown source.  She was started on cefepime and vancomycin.  Blood cultures were drawn.  Patient did not have any diarrhea during hospitalization.  Within 24 hours, patient defervesced significantly and as of today, she has remained afebrile for last 2 days however her T-max was 103.1 more than 2 days ago.  Her antibiotics were switched to Zosyn from cefepime on her day 1 of admission and her vancomycin was discontinued altogether on day 2 of admission.  Since yesterday, she has only been on Zosyn.  Her blood culture and urine culture has remained negative thus far.  She has remained alert and oriented x3 at her baseline since last 2 days now.  Her procalcitonin was also unremarkable.  Although at this point in time, we have not been able to find a source of infection however her high-grade temperature of 103.1 is hard to ignore.  Patient is stable today so she is going to be discharged in a stable condition however I had a long discussion with patient's daughter Arline AspCindy over the phone about continuing versus not continuing and watching off of antibiotics.  After long discussion, we made a mutual decision to discharge patient on 7 more days of oral antibiotic to cover any unknown infection.  She is allergic to cephalosporins however she received cefepime and Zosyn here without having any allergic reaction.  At this point in time, I have chosen Augmentin for 7 days p.o. twice daily.  She will follow with PCP.  Discharge Diagnoses:  Principal Problem:   SIRS (systemic inflammatory response syndrome) (HCC) Active Problems:   Hypothyroidism  CKD (chronic kidney disease), stage III   Paroxysmal atrial fibrillation (HCC)   History of CVA (cerebrovascular accident)   Dementia (HCC)   Acute metabolic encephalopathy    Discharge Instructions  Discharge Instructions     Discharge patient   Complete by: As directed    Discharge disposition: 06-Home-Health Care Svc   Discharge patient date: 05/10/2019     Allergies as of 05/10/2019      Reactions   Pepcid [famotidine] Anaphylaxis, Swelling, Other (See Comments)   Causes "Throat Swelling"   Allopurinol Other (See Comments)   Caused headaches   Amlodipine Other (See Comments)   Results in PEDAL EDEMA    Atorvastatin Other (See Comments)   Causes her leg muscles to ache   Contrast Media [iodinated Diagnostic Agents] Hives   IVP dye per patient   Cephalexin Itching, Rash   Ciprofloxacin Itching, Rash   Colchicine Rash   Erythromycin Itching, Rash   Prilosec [omeprazole] Nausea Only   Sulfonamide Derivatives Itching, Rash   Tetracycline Itching, Rash      Medication List    TAKE these medications   acetaminophen 500 MG tablet Commonly known as: TYLENOL Take 500 mg by mouth as needed for moderate pain.   apixaban 2.5 MG Tabs tablet Commonly known as: Eliquis Take 1 tablet (2.5 mg total) by mouth 2 (two) times daily.   calcitRIOL 0.25 MCG capsule Commonly known as: ROCALTROL Take 0.25 mcg by mouth every other day. In the morning   conjugated estrogens vaginal cream Commonly known as: PREMARIN Place 1 Applicatorful vaginally at bedtime as needed (moisture).   fexofenadine 180 MG tablet Commonly known as: ALLEGRA Take 180 mg by mouth daily.   fluticasone 50 MCG/ACT nasal spray Commonly known as: FLONASE Place 2 sprays into both nostrils daily.   levothyroxine 75 MCG tablet Commonly known as: SYNTHROID Take 75 mcg by mouth daily.   potassium chloride SA 20 MEQ tablet Commonly known as: KLOR-CON Take 2 tablets (40 mEq total) by mouth daily.   vitamin B-12 100 MCG tablet Commonly known as: CYANOCOBALAMIN Take 100 mcg by mouth every Wednesday.      Follow-up Information    Gweneth DimitriMcNeill, Wendy, MD Follow up in 1 week(s).   Specialty: Family Medicine Contact information: [address] [place] [address] [phone]        Quintella Reicherturner, Traci R, MD .   Specialty: Cardiology Contact information: [phone] [address] [place] [address] [phone]        Marinus Mawaylor, Gregg W, MD .   Specialty: Cardiology Contact information: [phone] [address] [place] [address] [phone]          Allergies  Allergen Reactions  . Pepcid [Famotidine] Anaphylaxis, Swelling and Other (See Comments)    Causes "Throat Swelling"   . Allopurinol Other (See Comments)    Caused headaches  . Amlodipine Other (See Comments)    Results in PEDAL EDEMA   . Atorvastatin Other (See Comments)    Causes her leg muscles to ache  . Contrast Media [Iodinated Diagnostic Agents] Hives    IVP dye per patient  . Cephalexin Itching and Rash  . Ciprofloxacin Itching and Rash  . Colchicine Rash  . Erythromycin Itching and Rash  . Prilosec [Omeprazole] Nausea Only  . Sulfonamide Derivatives Itching and Rash  . Tetracycline Itching and Rash    Consultations: None   Procedures/Studies: Ct Abdomen Pelvis Wo Contrast  Result Date: 05/08/2019 CLINICAL DATA:  Nausea and  vomiting EXAM: CT ABDOMEN AND PELVIS WITHOUT CONTRAST TECHNIQUE: Multidetector CT imaging of the abdomen and pelvis was performed following the standard protocol without IV contrast. COMPARISON:  None. FINDINGS: LOWER CHEST: No basilar pleural or apical pericardial effusion. HEPATOBILIARY: Normal hepatic contours. There is no intra- or extrahepatic biliary dilatation. The gallbladder is normal. PANCREAS: Normal pancreatic contours without pancreatic ductal dilatation or peripancreatic fluid collection. SPLEEN: Normal. ADRENALS/URINARY TRACT: --Adrenal glands: Normal. --Right kidney/ureter: No hydronephrosis, nephroureterolithiasis or solid renal mass. --Left kidney/ureter: No hydronephrosis, nephroureterolithiasis or solid renal mass. --Urinary bladder: Normal for degree of distention STOMACH/BOWEL:  --Stomach/Duodenum: There is no hiatal hernia. The duodenal course and caliber are normal. --Small bowel: No dilatation or inflammation. --Colon: No focal abnormality. --Appendix: Normal. VASCULAR/LYMPHATIC: There is calcific aortic atherosclerosis. No abdominal or pelvic lymphadenopathy. REPRODUCTIVE: Status post hysterectomy. No adnexal mass. MUSCULOSKELETAL. No bony spinal canal stenosis or focal osseous abnormality. OTHER: None. IMPRESSION: No acute abdominopelvic abnormality. Aortic atherosclerosis (ICD10-I70.0). Electronically Signed   By: Deatra Robinson M.D.   On: 05/08/2019 03:42   Ct Head Wo Contrast  Result Date: 05/08/2019 CLINICAL DATA:  Altered mental status EXAM: CT HEAD WITHOUT CONTRAST TECHNIQUE: Contiguous axial images were obtained from the base of the skull through the vertex without intravenous contrast. COMPARISON:  12/26/2018 FINDINGS: Brain: Chronic atrophic and ischemic changes are again identified and stable. Encephalomalacia consistent with prior right MCA infarct is noted and stable. Stable lacunar infarcts are noted within the basal ganglia bilaterally. No findings to suggest acute hemorrhage, acute infarction or space-occupying mass lesion are noted. Vascular: No hyperdense vessel or unexpected calcification. Skull: Normal. Negative for fracture or focal lesion. Sinuses/Orbits: No acute finding. Other: None. IMPRESSION: Chronic atrophic and ischemic changes stable from the prior exam. No acute abnormality noted. Electronically Signed   By: Alcide Clever M.D.   On: 05/08/2019 01:38   Dg Chest Port 1 View  Result Date: 05/08/2019 CLINICAL DATA:  Fever and altered mental status EXAM: PORTABLE CHEST 1 VIEW COMPARISON:  12/26/2018 FINDINGS: The heart size and mediastinal contours are within normal limits. Both lungs are clear. Right pleural effusion has resolved. There is a leftchest wall pacemakerwith leads projecting within the right atrium and right ventricle. The visualized  skeletal structures are unremarkable. Left atrial appendage clip and mitral valve prosthesis. IMPRESSION: No active cardiopulmonary disease. Right pleural effusion has resolved. Electronically Signed   By: Deatra Robinson M.D.   On: 05/08/2019 00:51      Discharge Exam: Vitals:   05/10/19 0417 05/10/19 0752  BP: (!) 153/91 (!) 148/71  Pulse: 92 84  Resp: 16 16  Temp: (!) 97.5 F (36.4 C) 97.6 F (36.4 C)  SpO2: 100% 98%   Vitals:   05/09/19 2100 05/10/19 0007 05/10/19 0417 05/10/19 0752  BP: (!) 132/102 (!) 147/81 (!) 153/91 (!) 148/71  Pulse: 94 88 92 84  Resp: 16 16 16 16   Temp: 98 F (36.7 C) 97.7 F (36.5 C) (!) 97.5 F (36.4 C) 97.6 F (36.4 C)  TempSrc: Oral Oral Oral Oral  SpO2: 99% 99% 100% 98%  Weight:      Height:        General: Pt is alert, awake, not in acute distress Cardiovascular: RRR, S1/S2 +, no rubs, no gallops Respiratory: CTA bilaterally, no wheezing, no rhonchi Abdominal: Soft, NT, ND, bowel sounds + Extremities: no edema, no cyanosis    The results of significant diagnostics from this hospitalization (including imaging, microbiology, ancillary and laboratory) are listed below for reference.  Microbiology: Recent Results (from the past 240 hour(s))  Urine culture     Status: None   Collection Time: 05/08/19 12:03 AM   Specimen: In/Out Cath Urine  Result Value Ref Range Status   Specimen Description IN/OUT CATH URINE  Final   Special Requests NONE  Final   Culture   Final    NO GROWTH Performed at [facility], [address] [place], [place] [address]    Report Status 05/08/2019 FINAL  Final  SARS CORONAVIRUS 2 (TAT 6-24 HRS) Nasopharyngeal Nasopharyngeal Swab     Status: None   Collection Time: 05/08/19 12:22 AM   Specimen: Nasopharyngeal Swab  Result Value Ref Range Status   SARS Coronavirus 2 NEGATIVE NEGATIVE Final    Comment: (NOTE) SARS-CoV-2 target nucleic acids are NOT DETECTED. The SARS-CoV-2 RNA is generally  detectable in upper and lower respiratory specimens during the acute phase of infection. Negative results do not preclude SARS-CoV-2 infection, do not rule out co-infections with other pathogens, and should not be used as the sole basis for treatment or other patient management decisions. Negative results must be combined with clinical observations, patient history, and epidemiological information. The expected result is Negative. Fact Sheet for Patients: SugarRoll.be Fact Sheet for Healthcare Providers: https://www.woods-mathews.com/ This test is not yet approved or cleared by the [place] FDA and  has been authorized for detection and/or diagnosis of SARS-CoV-2 by FDA under an Emergency Use Authorization (EUA). This EUA will remain  in effect (meaning this test can be used) for the duration of the COVID-19 declaration under Section 56 4(b)(1) of the Act, 21 U.S.C. section 360bbb-3(b)(1), unless the authorization is terminated or revoked sooner. Performed at [facility], [place] [address], [place], [place] [address]   Blood Culture (routine x 2)     Status: None (Preliminary result)   Collection Time: 05/08/19 12:30 AM   Specimen: BLOOD  Result Value Ref Range Status   Specimen Description BLOOD LEFT ANTECUBITAL  Final   Special Requests   Final    BOTTLES DRAWN AEROBIC AND ANAEROBIC Blood Culture adequate volume   Culture   Final    NO GROWTH 2 DAYS Performed at [facility], [place] [address], [place], [place] [address]    Report Status PENDING  Incomplete  Blood Culture (routine x 2)     Status: None (Preliminary result)   Collection Time: 05/08/19 12:43 AM   Specimen: BLOOD  Result Value Ref Range Status   Specimen Description BLOOD RIGHT ANTECUBITAL  Final   Special Requests   Final    BOTTLES DRAWN AEROBIC AND ANAEROBIC Blood Culture adequate volume   Culture   Final    NO GROWTH 2 DAYS Performed at [facility], [place] [address] [place], [place] [address]    Report Status PENDING  Incomplete     Labs: BNP (last 3 results) No results for input(s): BNP in the last 8760 hours. Basic Metabolic Panel: Recent Labs  Lab 05/08/19 0003 05/09/19 0322 05/10/19 0440  NA 138 138 143  K 3.7 3.2* 3.8  CL 99 101 110  CO2 25 25 25   GLUCOSE 189* 110* 96  BUN 19 24* 18  CREATININE 1.46* 1.75* 1.67*  CALCIUM 10.0 9.3 9.5   Liver Function Tests: Recent Labs  Lab 05/08/19 0003  AST 42*  ALT 29  ALKPHOS 70  BILITOT 1.0  PROT 6.8  ALBUMIN 4.0   No results for input(s): LIPASE, AMYLASE in  the last 168 hours. No results for input(s): AMMONIA in the last 168 hours. CBC: Recent Labs  Lab 05/08/19 0003 05/09/19 0322 05/10/19 0440  WBC 14.8* 8.6 8.7  NEUTROABS 12.6* 5.3 5.0  HGB 13.3 10.7* 12.1  HCT 41.5 31.5* 36.4  MCV 97.0 91.3 92.6  PLT 255 193 223   Cardiac Enzymes: No results for input(s): CKTOTAL, CKMB, CKMBINDEX, TROPONINI in the last 168 hours. BNP: Invalid input(s): POCBNP CBG: Recent Labs  Lab 05/07/19 2359 05/08/19 0925 05/09/19 0622  GLUCAP 169* 117* 100*   D-Dimer No results for input(s): DDIMER in the last 72 hours. Hgb A1c No results for input(s): HGBA1C in the last 72 hours. Lipid Profile No results for input(s): CHOL, HDL, LDLCALC, TRIG, CHOLHDL, LDLDIRECT in the last 72 hours. Thyroid function studies No results for input(s): TSH, T4TOTAL, T3FREE, THYROIDAB in the last 72 hours.  Invalid input(s): FREET3 Anemia work up No results for input(s): VITAMINB12, FOLATE, FERRITIN, TIBC, IRON, RETICCTPCT in the last 72 hours. Urinalysis    Component Value Date/Time   COLORURINE STRAW (A) 05/08/2019 0115   APPEARANCEUR CLEAR 05/08/2019 0115   LABSPEC 1.006 05/08/2019 0115   PHURINE 8.0 05/08/2019 0115   GLUCOSEU 50 (A) 05/08/2019 0115   HGBUR MODERATE (A) 05/08/2019 0115   BILIRUBINUR NEGATIVE 05/08/2019 0115   KETONESUR 20 (A) 05/08/2019 0115   PROTEINUR  NEGATIVE 05/08/2019 0115   UROBILINOGEN 1.0 04/01/2015 1840   NITRITE NEGATIVE 05/08/2019 0115   LEUKOCYTESUR NEGATIVE 05/08/2019 0115   Sepsis Labs Invalid input(s): PROCALCITONIN,  WBC,  LACTICIDVEN Microbiology Recent Results (from the past 240 hour(s))  Urine culture     Status: None   Collection Time: 05/08/19 12:03 AM   Specimen: In/Out Cath Urine  Result Value Ref Range Status   Specimen Description IN/OUT CATH URINE  Final   Special Requests NONE  Final   Culture   Final    NO GROWTH Performed at [facility], [address], [place], [place] [address]    Report Status 05/08/2019 FINAL  Final  SARS CORONAVIRUS 2 (TAT 6-24 HRS) Nasopharyngeal Nasopharyngeal Swab     Status: None   Collection Time: 05/08/19 12:22 AM   Specimen: Nasopharyngeal Swab  Result Value Ref Range Status   SARS Coronavirus 2 NEGATIVE NEGATIVE Final    Comment: (NOTE) SARS-CoV-2 target nucleic acids are NOT DETECTED. The SARS-CoV-2 RNA is generally detectable in upper and lower respiratory specimens during the acute phase of infection. Negative results do not preclude SARS-CoV-2 infection, do not rule out co-infections with other pathogens, and should not be used as the sole basis for treatment or other patient management decisions. Negative results must be combined with clinical observations, patient history, and epidemiological information. The expected result is Negative. Fact Sheet for Patients: HairSlick.no Fact Sheet for Healthcare Providers: quierodirigir.com This test is not yet approved or cleared by the [place] FDA and  has been authorized for detection and/or diagnosis of SARS-CoV-2 by FDA under an Emergency Use Authorization (EUA). This EUA will remain  in effect (meaning this test can be used) for the duration of the COVID-19 declaration under Section 56 4(b)(1) of the Act, 21 U.S.C. section 360bbb-3(b)(1), unless  the authorization is terminated or revoked sooner. Performed at [facility], [address], [place], [place] [address]   Blood Culture (routine x 2)     Status: None (Preliminary result)   Collection Time: 05/08/19 12:30 AM   Specimen: BLOOD  Result Value Ref Range  Status   Specimen Description BLOOD LEFT ANTECUBITAL  Final   Special Requests   Final    BOTTLES DRAWN AEROBIC AND ANAEROBIC Blood Culture adequate volume   Culture   Final    NO GROWTH 2 DAYS Performed at [facility], [address], [place], [place] [address]    Report Status PENDING  Incomplete  Blood Culture (routine x 2)     Status: None (Preliminary result)   Collection Time: 05/08/19 12:43 AM   Specimen: BLOOD  Result Value Ref Range Status   Specimen Description BLOOD RIGHT ANTECUBITAL  Final   Special Requests   Final    BOTTLES DRAWN AEROBIC AND ANAEROBIC Blood Culture adequate volume   Culture   Final    NO GROWTH 2 DAYS Performed at [facility], [address], [place], [place] [address]    Report Status PENDING  Incomplete     Time coordinating discharge: Over 30 minutes  SIGNED:   Hughie Closs, MD  [facility] 05/10/2019, 8:26 AM  If 7PM-7AM, please contact night-coverage [email or web address] Password [email or web address]

## 2019-05-10 NOTE — Discharge Instructions (Signed)
Sepsis, Diagnosis, Adult °Sepsis is a serious bodily reaction to an infection. The infection that triggers sepsis may be from a bacteria, virus, or fungus. Sepsis can result from an infection in any part of your body. Infections that commonly lead to sepsis include skin, lung, and urinary tract infections. °Sepsis is a medical emergency that must be treated right away in a hospital. In severe cases, it can lead to septic shock. Septic shock can weaken your heart and cause your blood pressure to drop. This can cause your central nervous system and your body's organs to stop working. °What are the causes? °This condition is caused by a severe reaction to infections from bacteria, viruses, or fungus. The germs that most often lead to sepsis include: °· Escherichia coli (E. coli) bacteria. °· Staphylococcus aureus (staph) bacteria. °· Some types of Streptococcus bacteria. °The most common infections affect these organs: °· The lung (pneumonia). °· The kidneys or bladder (urinary tract infection). °· The skin (cellulitis). °· The bowel, gallbladder, or pancreas. °What increases the risk? °You are more likely to develop this condition if: °· Your body's disease-fighting system (immune system) is weakened. °· You are age 65 or older. °· You are female. °· You had surgery or you have been hospitalized. °· You have these devices inserted into your body: °? A small, thin tube (catheter). °? IV line. °? Breathing tube. °? Drainage tube. °· You are not getting enough nutrients from food (malnourished). °· You have a long-term (chronic) disease, such as cancer, lung disease, kidney disease, or diabetes. °· You are African American. °What are the signs or symptoms? °Symptoms of this condition may include: °· Fever. °· Chills or feeling very cold. °· Confusion or anxiety. °· Fatigue. °· Muscle aches. °· Shortness of breath. °· Nausea and vomiting. °· Urinating much less than usual. °· Fast heart rate (tachycardia). °· Rapid  breathing (hyperventilation). °· Changes in skin color. Your skin may look blotchy, pale, or blue. °· Cool, clammy, or sweaty skin. °· Skin rash. °Other symptoms depend on the source of your infection. °How is this diagnosed? °This condition is diagnosed based on: °· Your symptoms. °· Your medical history. °· A physical exam. °Other tests may also be done to find out the cause of the infection and how severe the sepsis is. These tests may include: °· Blood tests. °· Urine tests. °· Swabs from other areas of your body that may have an infection. These samples may be tested (cultured) to find out what type of bacteria is causing the infection. °· Chest X-ray to check for pneumonia. Other imaging tests, such as a CT scan, may also be done. °· Lumbar puncture. This removes a small amount of the fluid that surrounds your brain and spinal cord. The fluid is then examined for infection. °How is this treated? °This condition must be treated in a hospital. Based on the cause of your infection, you may be given an antibiotic, antiviral, or antifungal medicine. °You may also receive: °· Fluids through an IV. °· Oxygen and breathing assistance. °· Medicines to increase your blood pressure. °· Kidney dialysis. This process cleans your blood if your kidneys have failed. °· Surgery to remove infected tissue. °· Blood transfusion if needed. °· Medicine to prevent blood clots. °· Nutrients to correct imbalances in basic body function (metabolism). You may: °? Receive important salts and minerals (electrolytes) through an IV. °? Have your blood sugar level adjusted. °Follow these instructions at home: °Medicines ° °· Take over-the-counter and   prescription medicines only as told by your health care provider. °· If you were prescribed an antibiotic, antiviral, or antifungal medicine, take it as told by your health care provider. Do not stop taking the medicine even if you start to feel better. °General instructions °· If you have a  catheter or other indwelling device, ask to have it removed as soon as possible. °· Keep all follow-up visits as told by your health care provider. This is important. °Contact a health care provider if: °· You do not feel like you are getting better or regaining strength. °· You are having trouble coping with your recovery. °· You frequently feel tired. °· You feel worse or do not seem to get better after surgery. °· You think you may have an infection after surgery. °Get help right away if: °· You have any symptoms of sepsis. °· You have difficulty breathing. °· You have a rapid or skipping heartbeat. °· You become confused or disoriented. °· You have a high fever. °· Your skin becomes blotchy, pale, or blue. °· You have an infection that is getting worse or not getting better. °These symptoms may represent a serious problem that is an emergency. Do not wait to see if the symptoms will go away. Get medical help right away. Call your local emergency services (911 in the [place] Do not drive yourself to the hospital. °Summary °· Sepsis is a medical emergency that requires immediate treatment in a hospital. °· This condition is caused by a severe reaction to infections from bacteria, viruses, or fungus. °· Based on the cause of your infection, you may be given an antibiotic, antiviral, or antifungal medicine. °· Treatment may also include IV fluids, breathing assistance, and kidney dialysis. °This information is not intended to replace advice given to you by your health care provider. Make sure you discuss any questions you have with your health care provider. °Document Released: 03/24/2003 Document Revised: 01/31/2018 Document Reviewed: 01/31/2018 °Elsevier Patient Education © 2020 Elsevier Inc. ° °

## 2019-05-10 NOTE — TOC Transition Note (Signed)
Transition of Care [facility]) - CM/SW Discharge Note   Patient Details  Name: AUDERY WASSENAAR MRN: [id number] Date of Birth: [birth year]  [facility] CM/SW Contact:  Carles Collet, RN Phone Number: 05/10/2019, 10:38 AM   Clinical Narrative:   Damaris Schooner w patient's daughter.  Patient lives at home alone, will have sister and daughters checking on her, most family lives close by. Declined DME, stated they already had it.  Discussed Medicare ratings, would like either like [facility] or [facility]. Referral accepted by [facility]. I requested orders from MD for [facility].  Daughter will provide ride home. No other CM needs identified.      Final next level of care: [place] Barriers to Discharge: No Barriers Identified   Patient Goals and CMS Choice Patient states their goals for this hospitalization and ongoing recovery are:: to return home CMS Medicare.gov Compare Post Acute Care list provided to:: Other (Comment Required) Choice offered to / list presented to : Adult Children  Discharge Placement                       Discharge Plan and Services                DME Arranged: (declined)         HH Arranged: PT, OT HH Agency: [place] Date [place]: 05/10/19 Time [place]: 1038 Representative spoke with at [place]: [place] Determinants of Health ([place]) Interventions     Readmission Risk Interventions No flowsheet data found.

## 2019-05-10 NOTE — Progress Notes (Signed)
Pt given discharge instructions and discharged via daughter as transportation.

## 2019-05-12 ENCOUNTER — Other Ambulatory Visit: Payer: Self-pay

## 2019-05-12 ENCOUNTER — Other Ambulatory Visit: Payer: Medicare Other

## 2019-05-12 DIAGNOSIS — E876 Hypokalemia: Secondary | ICD-10-CM

## 2019-05-13 LAB — CULTURE, BLOOD (ROUTINE X 2)
Culture: NO GROWTH
Culture: NO GROWTH
Special Requests: ADEQUATE
Special Requests: ADEQUATE

## 2019-05-13 LAB — BASIC METABOLIC PANEL
BUN/Creatinine Ratio: 12 (ref 12–28)
BUN: 14 mg/dL (ref 8–27)
CO2: 27 mmol/L (ref 20–29)
Calcium: 10 mg/dL (ref 8.7–10.3)
Chloride: 104 mmol/L (ref 96–106)
Creatinine, Ser: 1.18 mg/dL — ABNORMAL HIGH (ref 0.57–1.00)
GFR calc Af Amer: 50 mL/min/{1.73_m2} — ABNORMAL LOW (ref >59)
GFR calc non Af Amer: 44 mL/min/{1.73_m2} — ABNORMAL LOW (ref >59)
Glucose: 96 mg/dL (ref 65–99)
Potassium: 4.4 mmol/L (ref 3.5–5.2)
Sodium: 142 mmol/L (ref 134–144)

## 2019-05-18 ENCOUNTER — Ambulatory Visit (INDEPENDENT_AMBULATORY_CARE_PROVIDER_SITE_OTHER): Payer: Medicare Other | Admitting: *Deleted

## 2019-05-18 DIAGNOSIS — I4430 Unspecified atrioventricular block: Secondary | ICD-10-CM

## 2019-05-18 DIAGNOSIS — I480 Paroxysmal atrial fibrillation: Secondary | ICD-10-CM

## 2019-05-19 LAB — CUP PACEART REMOTE DEVICE CHECK
Battery Remaining Longevity: 115 mo
Battery Remaining Percentage: 95.5 %
Battery Voltage: 2.99 V
Brady Statistic AP VP Percent: 6.1 %
Brady Statistic AP VS Percent: 1 %
Brady Statistic AS VP Percent: 92 %
Brady Statistic AS VS Percent: 1.1 %
Brady Statistic RA Percent Paced: 5 %
Brady Statistic RV Percent Paced: 98 %
Date Time Interrogation Session: 20201109071259
Implantable Lead Implant Date: 20160701
Implantable Lead Implant Date: 20160701
Implantable Lead Location: 753859
Implantable Lead Location: 753860
Implantable Pulse Generator Implant Date: 20160701
Lead Channel Impedance Value: 350 Ohm
Lead Channel Impedance Value: 480 Ohm
Lead Channel Pacing Threshold Amplitude: 0.75 V
Lead Channel Pacing Threshold Amplitude: 1.125 V
Lead Channel Pacing Threshold Pulse Width: 0.4 ms
Lead Channel Pacing Threshold Pulse Width: 0.4 ms
Lead Channel Sensing Intrinsic Amplitude: 12 mV
Lead Channel Sensing Intrinsic Amplitude: 3.4 mV
Lead Channel Setting Pacing Amplitude: 1.375
Lead Channel Setting Pacing Amplitude: 2 V
Lead Channel Setting Pacing Pulse Width: 0.4 ms
Lead Channel Setting Sensing Sensitivity: 2 mV
Pulse Gen Model: 2240
Pulse Gen Serial Number: 7785935

## 2019-06-13 NOTE — Progress Notes (Signed)
Remote pacemaker transmission.   

## 2019-06-26 ENCOUNTER — Other Ambulatory Visit: Payer: Self-pay | Admitting: Physician Assistant

## 2019-06-26 MED ORDER — POTASSIUM CHLORIDE 40 MEQ/15ML (20%) PO SOLN: 40.0000 meq | Freq: Every day | ORAL | 3 refills | Status: DC

## 2019-06-26 NOTE — Telephone Encounter (Signed)
New Message:     Anne Ng from [place] wants to know if pt can change from the Potassium pill to the Potassium Liquid? If so,, would you  Please sent this to her pharmacy, [facility].      *STAT* If patient is at the pharmacy, call can be transferred to refill team.   1. Which medications need to be refilled? (please list name of each medication and dose if known)  Potassium Liquid  2. Which pharmacy/location (including street and city if local pharmacy) is medication to be sent to? [facility]  3. Do they need a 30 day or 90 day supply?  She did not know how many patient get

## 2019-06-26 NOTE — Telephone Encounter (Signed)
Verbal order given to change Pt's potassium to liquid.  Will request assistance from [facility] pharmacists to enter order.

## 2019-06-26 NOTE — Telephone Encounter (Signed)
Please send refill to [facility], [place] - [address]

## 2019-06-30 ENCOUNTER — Telehealth (HOSPITAL_COMMUNITY): Payer: Self-pay | Admitting: *Deleted

## 2019-06-30 NOTE — Telephone Encounter (Signed)
Contacted patient to update status of return to exercise in the cardiac rehab maintenance program. Patient aware that we will be temporarily suspending onsite maintenance exercise classes and would like to be contacted once we resume.  Sol Passer, MS, ACSM CEP 06/30/2019 1511

## 2019-07-23 ENCOUNTER — Ambulatory Visit: Payer: Medicare Other | Attending: Internal Medicine

## 2019-07-23 DIAGNOSIS — Z23 Encounter for immunization: Secondary | ICD-10-CM | POA: Insufficient documentation

## 2019-07-23 NOTE — Progress Notes (Signed)
   Covid-19 Vaccination Clinic  Name:  Amanda [name]    MRN: [id number] DOB: 1940-07-16  07/23/2019  [name] was observed post Covid-19 immunization for 30 minutes based on pre-vaccination screening without incidence. She was provided with Vaccine Information Sheet and instruction to access the V-Safe system.   [name] was instructed to call 911 with any severe reactions post vaccine: [name] Difficulty breathing  . Swelling of your face and throat  . A fast heartbeat  . A bad rash all over your body  . Dizziness and weakness    Immunizations Administered    Name Date Dose VIS Date Route   Pfizer COVID-19 Vaccine 07/23/2019 11:21 AM 0.3 mL 06/19/2019 Intramuscular   Manufacturer: [employer], [employer]   Lot: [id number]   NDC: [address]-5

## 2019-08-10 ENCOUNTER — Ambulatory Visit: Payer: Medicare Other | Attending: Internal Medicine

## 2019-08-10 DIAGNOSIS — Z23 Encounter for immunization: Secondary | ICD-10-CM

## 2019-08-10 NOTE — Progress Notes (Signed)
   Covid-19 Vaccination Clinic  Name:  AFTYN NOTT    MRN: [id number] DOB: [birth year]  08/10/2019  Ms. Tholl was observed post Covid-19 immunization for 15 minutes without incidence. She was provided with Vaccine Information Sheet and instruction to access the V-Safe system.   Ms. Daye was instructed to call 911 with any severe reactions post vaccine: [name] Difficulty breathing  . Swelling of your face and throat  . A fast heartbeat  . A bad rash all over your body  . Dizziness and weakness    Immunizations Administered    Name Date Dose VIS Date Route   Pfizer COVID-19 Vaccine 08/10/2019 10:21 AM 0.3 mL 06/19/2019 Intramuscular   Manufacturer: [employer], [employer]   Lot: [id number]   NDC: [address]-6

## 2019-08-12 ENCOUNTER — Telehealth: Payer: Self-pay | Admitting: Neurology

## 2019-08-12 ENCOUNTER — Other Ambulatory Visit: Payer: Self-pay

## 2019-08-12 ENCOUNTER — Ambulatory Visit: Payer: Medicare Other | Admitting: Neurology

## 2019-08-12 ENCOUNTER — Encounter: Payer: Self-pay | Admitting: Neurology

## 2019-08-12 VITALS — BP 152/93 | HR 99 | Temp 97.2°F | Wt 122.0 lb

## 2019-08-12 DIAGNOSIS — F0390 Unspecified dementia without behavioral disturbance: Secondary | ICD-10-CM | POA: Diagnosis not present

## 2019-08-12 MED ORDER — ESCITALOPRAM OXALATE 5 MG PO TABS: 5.0000 mg | ORAL_TABLET | Freq: Every day | ORAL | 3 refills | Status: DC

## 2019-08-12 NOTE — Patient Instructions (Signed)
We will start lexapro 5 mg a day, call for any dose adjustments.

## 2019-08-12 NOTE — Progress Notes (Signed)
Reason for visit: Memory disturbance  Amanda [name] is an 79 y.o. female  History of present illness:  Amanda [name] is an 79 year old right-handed white female with a history of cerebrovascular disease with a right parietal stroke and extensive small vessel disease.  The patient has a progressive dementing illness that it is probably at least in part related to vascular dementia.  The patient was in the hospital on 07 May 2019 with a fever up to 103 and a white count of 14.8.  She was treated with antibiotics for presumed sepsis although the source of this was never determined.  The patient is living at home, she requires assistance with her medications, appointments, and some assistance with bathing and dressing.  The patient continues to lose weight as she has a poor appetite, she has lost 5 pounds since last visit.  She is having some problems with feeling fidgety throughout the day, she may have some visual hallucinations at times but this does not result in severe agitation.  The patient is sleeping well at night.  She does have occasional headaches which this seems to be related to missing a meal.  If she eats the headache improves.  She returns to the office today for an evaluation.  Past Medical History:  Diagnosis Date   Allergic rhinitis    Anemia    Asymptomatic LV dysfunction    EF 45-50% echo 07/2017   CKD (chronic kidney disease), stage III    stage III   DJD (degenerative joint disease)    Glomerulonephritis    Dr Jimmy Footman   Hyperlipidemia    Hyperparathyroidism ([place])    Hypertension    Hypothyroidism    IBS (irritable bowel syndrome)    Interstitial cystitis    Mitral regurgitation    Mobitz II    a. s/p STJ dual chamber PPM    MVP (mitral valve prolapse)    moderate posterior MVP with moderate MR and grade II diasotlic dysfunction   PAC (premature atrial contraction)    PAF (paroxysmal atrial fibrillation) ([place])     note on pacer check.  CHADS2VASC score is 4 now on Eliquis.   PVC's (premature ventricular contractions)    S/P minimally invasive maze operation for atrial fibrillation 04/16/2017   Complete bilateral atrial lesion set using cryothermy and bipolar radiofrequency ablation with clipping of LA appendage via right mini thoracotomy approach   S/P minimally invasive mitral valve repair 04/16/2017   Complex valvuloplasty including triangular resection of posterior leaflet, artificial Gore-tex neochords x6 and Sorin Memo 3D ring annuloplasty ([id number], size 32, serial # [id number])   S/P placement of cardiac pacemaker    Small vessel disease, cerebrovascular    Stroke [facility])    a. old stroke seen on imaging.    Past Surgical History:  Procedure Laterality Date   ABDOMINAL HYSTERECTOMY     BREAST BIOPSY Right    CHEST TUBE INSERTION Right 05/23/2017   Procedure: INSERTION PLEURAL DRAINAGE CATHETER;  Surgeon: [name], MD;  Location: [place];  Service: Thoracic;  Laterality: Right;  possible pleurex catheter   CLIPPING OF ATRIAL APPENDAGE  04/16/2017   Procedure: CLIPPING OF ATRIAL APPENDAGE using AtriCure Pro2 clip 45;  Surgeon: [name], MD;  Location: [place];  Service: Open Heart Surgery;;   CYSTOSTOMY W/ BLADDER BIOPSY     EP IMPLANTABLE DEVICE N/A 01/07/2015   STJ dual chamber pacemaker implanted by Dr Lovena Le for 2:1 heart block   MINIMALLY INVASIVE MAZE  PROCEDURE N/A 04/16/2017   Procedure: MINIMALLY INVASIVE MAZE PROCEDURE;  Surgeon: Purcell Nails, MD;  Location[facility] OR;  Service: Open Heart Surgery;  Laterality: N/A;   MITRAL VALVE REPAIR Right 04/16/2017   Procedure: MINIMALLY INVASIVE MITRAL VALVE REPAIR (MVR);  Surgeon: Purcell Nails, MD;  Location[facility] OR;  Service: Open Heart Surgery;  Laterality: Right;   MITRAL VALVE REPAIR Right 04/16/2017   Procedure: RE-EXPLORATION RIGHT THORACOTOMY FOR BLEEDING;  Surgeon: Purcell Nails, MD;  Location: [facility] OR;  Service: Open Heart Surgery;   Laterality: Right;   PERIPHERAL VASCULAR CATHETERIZATION N/A 02/03/2015   Procedure: Upper Extremity Venography;  Surgeon: [name], MD;  Location: [facility] INVASIVE CV LAB;  Service: Cardiovascular;  Laterality: N/A;   REMOVAL OF PLEURAL DRAINAGE CATHETER Right 09/03/2017   Procedure: REMOVAL OF PLEURAL DRAINAGE CATHETER;  Surgeon: Purcell Nails, MD;  Location: [facility] OR;  Service: Thoracic;  Laterality: Right;   RIGHT/LEFT HEART CATH AND CORONARY ANGIOGRAPHY N/A 03/26/2017   Procedure: RIGHT/LEFT HEART CATH AND CORONARY ANGIOGRAPHY;  Surgeon: [name], MD;  Location: [facility] INVASIVE CV LAB;  Service: Cardiovascular;  Laterality: N/A;   TEE WITHOUT CARDIOVERSION N/A 03/18/2017   Procedure: TRANSESOPHAGEAL ECHOCARDIOGRAM (TEE);  Surgeon: [name], MD;  Location: [facility] ENDOSCOPY;  Service: Cardiovascular;  Laterality: N/A;   TEE WITHOUT CARDIOVERSION N/A 04/16/2017   Procedure: TRANSESOPHAGEAL ECHOCARDIOGRAM (TEE);  Surgeon: Purcell Nails, MD;  Location: [facility] OR;  Service: Open Heart Surgery;  Laterality: N/A;   TONSILLECTOMY      Family History  Problem Relation Age of Onset   Heart disease Mother    Stroke Mother    Hypertension Mother    Heart disease Father    Heart attack Father    Brain cancer Brother    Heart disease Brother    Melanoma Brother    Heart attack Brother    Hypertension Brother    Ovarian cancer Sister    Hypertension Sister    Rheum arthritis Sister    Colon cancer Neg Hx     Social history:  reports that she has quit smoking. She has never used smokeless tobacco. She reports that she does not drink alcohol or use drugs.    Allergies  Allergen Reactions   Pepcid [Famotidine] Anaphylaxis, Swelling and Other (See Comments)    Causes "Throat Swelling"    Allopurinol Other (See Comments)    Caused headaches   Amlodipine Other (See Comments)    Results in PEDAL EDEMA    Atorvastatin Other (See Comments)    Causes her leg  muscles to ache   Contrast Media [Iodinated Diagnostic Agents] Hives    IVP dye per patient   Cephalexin Itching and Rash   Ciprofloxacin Itching and Rash   Colchicine Rash   Erythromycin Itching and Rash   Prilosec [Omeprazole] Nausea Only   Sulfonamide Derivatives Itching and Rash   Tetracycline Itching and Rash    Medications:  Prior to Admission medications   Medication Sig Start Date End Date Taking? Authorizing Provider  acetaminophen (TYLENOL) 500 MG tablet Take 500 mg by mouth as needed for moderate pain.    Yes [provider]  apixaban (ELIQUIS) 2.5 MG TABS tablet Take 1 tablet (2.5 mg total) by mouth 2 (two) times daily. 04/22/19  Yes Newman Nip, NP  calcitRIOL (ROCALTROL) 0.25 MCG capsule Take 0.25 mcg by mouth every other day. In the morning    Yes [provider]  conjugated estrogens (PREMARIN) vaginal  cream Place 1 Applicatorful vaginally at bedtime as needed (moisture).    Yes [provider]  divalproex (DEPAKOTE SPRINKLE) 125 MG capsule TAKE ONE CAPSULE TWICE A DAY FOR 2 WEEKS THEN TAKE TWO CAPSULES TWICE DAILY 07/06/19  Yes [provider]  fexofenadine (ALLEGRA) 180 MG tablet Take 180 mg by mouth daily.   Yes [provider]  fluticasone (FLONASE) 50 MCG/ACT nasal spray Place 2 sprays into both nostrils daily.    Yes [provider]  levothyroxine (SYNTHROID, LEVOTHROID) 75 MCG tablet Take 75 mcg by mouth daily.   Yes [provider]  Potassium Chloride 40 MEQ/15ML (20%) SOLN Take 40 mEq by mouth daily. 06/26/19  Yes Marinus Maw, MD  vitamin B-12 (CYANOCOBALAMIN) 100 MCG tablet Take 100 mcg by mouth every Wednesday.    Yes [provider]    ROS:  Out of a complete 14 system review of symptoms, the patient complains only of the following symptoms, and all other reviewed systems are negative.  Memory problems Hallucinations Decreased appetite, weight loss  Blood pressure  (!) 152/93, pulse 99, temperature (!) 97.2 F (36.2 C), weight 122 lb (55.3 kg).  Physical Exam  General: The patient is alert and cooperative at the time of the examination.  Skin: No significant peripheral edema is noted.   Neurologic Exam  Mental status: The patient is alert and oriented x 2 at the time of the examination.  The patient is oriented to person and place, not date.   Cranial nerves: Facial symmetry is present. Speech is normal, no aphasia or dysarthria is noted. Extraocular movements are full. Visual fields are full.  Motor: The patient has good strength in all 4 extremities.  Sensory examination: Soft touch sensation is symmetric on the face, arms, and legs.  Coordination: The patient has good finger-nose-finger with the right hand and heel-to-shin bilaterally.  The patient has severe apraxia with use of the left upper extremity.  Gait and station: The patient has a slightly wide-based gait.  Tandem gait was not attempted.  With Romberg, she has a tendency to lean backwards.  Reflexes: Deep tendon reflexes are symmetric.   CT head 05/08/19:  IMPRESSION: Chronic atrophic and ischemic changes stable from the prior exam. No acute abnormality noted.  * CT scan images were reviewed online. I agree with the written report.    Assessment/Plan:  1.  Cerebrovascular disease  2.  Dementia  3.  Hallucinations  The patient is having problems with hallucinations that are not causing a problem with day to day care.  I would not place the patient on antipsychotic medications.  The patient is having some problems with feeling fidgety throughout the day, I will start low-dose Lexapro at 5 mg.  The family will call for any dose adjustments.  She will follow-up here in 6 months.  Marlan Palau MD 08/12/2019 2:07 PM  [facility] [address] [place], [place] [address]  Phone [phone] Fax [phone]

## 2019-08-12 NOTE — Telephone Encounter (Signed)
Hoffman, Cindy(daughter) was called by Dr Anne Hahn back in 04-2019.  She is calling to relay some information to RN so she in turn can make Dr Anne Hahn aware before pt's appointment.  Daughter would prefer not to discuss pt's hallucinations in front of pt while at appointment.  Hoffman, Cindy(daughter) states physically pt is fine but mentally not so much.  Please call if possible before the appointment.

## 2019-08-12 NOTE — Telephone Encounter (Signed)
Telephone note given to Dr. Patrecia Pace for appt of daughters concerns. Appt is at 200pm.

## 2019-08-17 ENCOUNTER — Ambulatory Visit (INDEPENDENT_AMBULATORY_CARE_PROVIDER_SITE_OTHER): Payer: Medicare Other | Admitting: *Deleted

## 2019-08-17 DIAGNOSIS — I480 Paroxysmal atrial fibrillation: Secondary | ICD-10-CM

## 2019-08-17 LAB — CUP PACEART REMOTE DEVICE CHECK
Battery Remaining Longevity: 116 mo
Battery Remaining Percentage: 95.5 %
Battery Voltage: 2.99 V
Brady Statistic AP VP Percent: 3.5 %
Brady Statistic AP VS Percent: 1 %
Brady Statistic AS VP Percent: 94 %
Brady Statistic AS VS Percent: 1.3 %
Brady Statistic RA Percent Paced: 2.8 %
Brady Statistic RV Percent Paced: 98 %
Date Time Interrogation Session: 20210208020026
Implantable Lead Implant Date: 20160701
Implantable Lead Implant Date: 20160701
Implantable Lead Location: 753859
Implantable Lead Location: 753860
Implantable Pulse Generator Implant Date: 20160701
Lead Channel Impedance Value: 380 Ohm
Lead Channel Impedance Value: 530 Ohm
Lead Channel Pacing Threshold Amplitude: 0.75 V
Lead Channel Pacing Threshold Amplitude: 1 V
Lead Channel Pacing Threshold Pulse Width: 0.4 ms
Lead Channel Pacing Threshold Pulse Width: 0.4 ms
Lead Channel Sensing Intrinsic Amplitude: 12 mV
Lead Channel Sensing Intrinsic Amplitude: 4.5 mV
Lead Channel Setting Pacing Amplitude: 1.25 V
Lead Channel Setting Pacing Amplitude: 2 V
Lead Channel Setting Pacing Pulse Width: 0.4 ms
Lead Channel Setting Sensing Sensitivity: 2 mV
Pulse Gen Model: 2240
Pulse Gen Serial Number: 7785935

## 2019-08-17 NOTE — Progress Notes (Signed)
PPM Remote  

## 2019-08-18 NOTE — Telephone Encounter (Signed)
Pt daughter Mikey Bussing, Arline Asp) is asking for a call from Dr Anne Hahn to discuss a manic episode that pt went into over the weekend.  Daughter does not know what to do and is asking for a call from Dr Anne Hahn

## 2019-08-19 NOTE — Telephone Encounter (Signed)
I called the daughter.  The patient is had episodes of increased or heightened agitation, she has been on the Lexapro for less than a week, I would double the dose to 10 mg.  They are looking at an extended care facility with memory care for the patient.  If Lexapro does not result in some denuding of the agitation, we may have to try other medication such as Depakote or even Risperdal.  The patient is actively hallucinating at times.

## 2019-08-26 ENCOUNTER — Ambulatory Visit: Payer: Medicare Other | Admitting: Cardiology

## 2019-09-17 ENCOUNTER — Other Ambulatory Visit: Payer: Self-pay

## 2019-09-17 ENCOUNTER — Encounter: Payer: Self-pay | Admitting: Cardiology

## 2019-09-17 ENCOUNTER — Ambulatory Visit: Payer: Medicare Other | Admitting: Cardiology

## 2019-09-17 VITALS — BP 134/80 | HR 85 | Ht 66.5 in | Wt 117.8 lb

## 2019-09-17 DIAGNOSIS — I420 Dilated cardiomyopathy: Secondary | ICD-10-CM

## 2019-09-17 DIAGNOSIS — N1831 Chronic kidney disease, stage 3a: Secondary | ICD-10-CM

## 2019-09-17 DIAGNOSIS — I340 Nonrheumatic mitral (valve) insufficiency: Secondary | ICD-10-CM | POA: Diagnosis not present

## 2019-09-17 DIAGNOSIS — I441 Atrioventricular block, second degree: Secondary | ICD-10-CM

## 2019-09-17 DIAGNOSIS — I480 Paroxysmal atrial fibrillation: Secondary | ICD-10-CM | POA: Diagnosis not present

## 2019-09-17 DIAGNOSIS — I10 Essential (primary) hypertension: Secondary | ICD-10-CM

## 2019-09-17 NOTE — Patient Instructions (Signed)
Medication Instructions:  Your physician recommends that you continue on your current medications as directed. Please refer to the Current Medication list given to you today.  *If you need a refill on your cardiac medications before your next appointment, please call your pharmacy*  Follow-Up: At [facility], you and your health needs are our priority.  As part of our continuing mission to provide you with exceptional heart care, we have created designated Provider Care Teams.  These Care Teams include your primary Cardiologist (physician) and Advanced Practice Providers (APPs -  Physician Assistants and Nurse Practitioners) who all work together to provide you with the care you need, when you need it.  Your next appointment:   6 month(s)  The format for your next appointment:   In Person  Provider:   Traci Turner, MD   

## 2019-09-17 NOTE — Progress Notes (Signed)
Cardiology Office Note:    Date:  09/17/2019   ID:  Amanda [name], DOB October 30, 1940, MRN [id number]  PCP:  [name], MD  Cardiologist:  Armanda Magic, MD    Referring MD: [name], MD   Chief Complaint  Patient presents with  . Mitral Regurgitation  . Atrial Fibrillation  . Hypertension    History of Present Illness:    Amanda [name] is a 79 y.o. female with a hx of severe MRs/pminimally invasive mitral valve repair and maze procedure 04/16/2017 for PAF. She had a complicated course requiring inpatient rehab and right thoracentesis. She then underwent right Pleurx catheter for recurrence. Also has history of PACs, PVCs,  Hypertension and CKD followed by nephrology.CHA2DS2-VASc 4on NOAC.  Her last echo 12/2018 showed mild LV dysfunction with EF 45-50% with mild apical anteroseptal and apical akinesis and stable MV repair with annuloplasty ring similar to prior echoes.   She is here today with her daughter for followup and is doing well.  She denies any chest pain or pressure, SOB, DOE, PND, orthopnea, LE edema, dizziness, palpitations or syncope. She is compliant with her meds and is tolerating meds with no SE.    Past Medical History:  Diagnosis Date  . Allergic rhinitis   . Anemia   . Asymptomatic LV dysfunction    EF 45-50% echo 07/2017  . CKD (chronic kidney disease), stage III    stage III  . DJD (degenerative joint disease)   . Glomerulonephritis    Dr [name]  . Hyperlipidemia   . Hyperparathyroidism (HCC)   . Hypertension   . Hypothyroidism   . IBS (irritable bowel syndrome)   . Interstitial cystitis   . Mitral regurgitation   . Mobitz II    a. s/p STJ dual chamber PPM   . MVP (mitral valve prolapse)    moderate posterior MVP with moderate MR and grade II diasotlic dysfunction  . PAC (premature atrial contraction)   . PAF (paroxysmal atrial fibrillation) (HCC)     note on pacer check. CHADS2VASC score is 4 now on Eliquis.  [name] PVC's  (premature ventricular contractions)   . S/P minimally invasive maze operation for atrial fibrillation 04/16/2017   Complete bilateral atrial lesion set using cryothermy and bipolar radiofrequency ablation with clipping of LA appendage via right mini thoracotomy approach  . S/P minimally invasive mitral valve repair 04/16/2017   Complex valvuloplasty including triangular resection of posterior leaflet, artificial Gore-tex neochords x6 and Sorin Memo 3D ring annuloplasty ([id number], size 32, serial # [id number])  . S/P placement of cardiac pacemaker   . Small vessel disease, cerebrovascular   . Stroke [facility])    a. old stroke seen on imaging.    Past Surgical History:  Procedure Laterality Date  . ABDOMINAL HYSTERECTOMY    . BREAST BIOPSY Right   . CHEST TUBE INSERTION Right 05/23/2017   Procedure: INSERTION PLEURAL DRAINAGE CATHETER;  Surgeon: Purcell Nails, MD;  Location: [facility] OR;  Service: Thoracic;  Laterality: Right;  possible pleurex catheter  . CLIPPING OF ATRIAL APPENDAGE  04/16/2017   Procedure: CLIPPING OF ATRIAL APPENDAGE using AtriCure Pro2 clip 45;  Surgeon: Purcell Nails, MD;  Location: [facility] OR;  Service: Open Heart Surgery;;  . CYSTOSTOMY W/ BLADDER BIOPSY    . EP IMPLANTABLE DEVICE N/A 01/07/2015   STJ dual chamber pacemaker implanted by Dr [name] for 2:1 heart block  . MINIMALLY INVASIVE MAZE PROCEDURE N/A 04/16/2017   Procedure: MINIMALLY INVASIVE MAZE PROCEDURE;  Surgeon:  Purcell Nails, MD;  Location: [facility] OR;  Service: Open Heart Surgery;  Laterality: N/A;  . MITRAL VALVE REPAIR Right 04/16/2017   Procedure: MINIMALLY INVASIVE MITRAL VALVE REPAIR (MVR);  Surgeon: Purcell Nails, MD;  Location: [facility] OR;  Service: Open Heart Surgery;  Laterality: Right;  . MITRAL VALVE REPAIR Right 04/16/2017   Procedure: RE-EXPLORATION RIGHT THORACOTOMY FOR BLEEDING;  Surgeon: Purcell Nails, MD;  Location: [facility] OR;  Service: Open Heart Surgery;  Laterality: Right;  . PERIPHERAL VASCULAR  CATHETERIZATION N/A 02/03/2015   Procedure: Upper Extremity Venography;  Surgeon: [name], MD;  Location: [facility] INVASIVE CV LAB;  Service: Cardiovascular;  Laterality: N/A;  . REMOVAL OF PLEURAL DRAINAGE CATHETER Right 09/03/2017   Procedure: REMOVAL OF PLEURAL DRAINAGE CATHETER;  Surgeon: Purcell Nails, MD;  Location: [facility] OR;  Service: Thoracic;  Laterality: Right;  . RIGHT/LEFT HEART CATH AND CORONARY ANGIOGRAPHY N/A 03/26/2017   Procedure: RIGHT/LEFT HEART CATH AND CORONARY ANGIOGRAPHY;  Surgeon: [name], MD;  Location: [facility] INVASIVE CV LAB;  Service: Cardiovascular;  Laterality: N/A;  . TEE WITHOUT CARDIOVERSION N/A 03/18/2017   Procedure: TRANSESOPHAGEAL ECHOCARDIOGRAM (TEE);  Surgeon: [name], MD;  Location: [facility] ENDOSCOPY;  Service: Cardiovascular;  Laterality: N/A;  . TEE WITHOUT CARDIOVERSION N/A 04/16/2017   Procedure: TRANSESOPHAGEAL ECHOCARDIOGRAM (TEE);  Surgeon: Purcell Nails, MD;  Location: [facility] OR;  Service: Open Heart Surgery;  Laterality: N/A;  . TONSILLECTOMY      Current Medications: Current Meds  Medication Sig  . acetaminophen (TYLENOL) 500 MG tablet Take 500 mg by mouth as needed for moderate pain.   [name] apixaban (ELIQUIS) 2.5 MG TABS tablet Take 1 tablet (2.5 mg total) by mouth 2 (two) times daily.  . calcitRIOL (ROCALTROL) 0.25 MCG capsule Take 0.25 mcg by mouth every other day. In the morning   . escitalopram (LEXAPRO) 5 MG tablet Take 5 mg by mouth daily.  . fexofenadine (ALLEGRA) 180 MG tablet Take 180 mg by mouth daily.  . fluticasone (FLONASE) 50 MCG/ACT nasal spray Place 2 sprays into both nostrils as needed for allergies or rhinitis.   [name] levothyroxine (SYNTHROID, LEVOTHROID) 75 MCG tablet Take 75 mcg by mouth daily.  . potassium chloride SA (KLOR-CON) 20 MEQ tablet Take 40 mEq by mouth daily.     Allergies:   Pepcid [famotidine], Allopurinol, Amlodipine, Atorvastatin, Contrast media [iodinated diagnostic agents], Cephalexin, Ciprofloxacin,  Colchicine, Erythromycin, Prilosec [omeprazole], Sulfonamide derivatives, and Tetracycline   Social History   Socioeconomic History  . Marital status: Married    Spouse name: Not on file  . Number of children: 2  . Years of education: Not on file  . Highest education level: Associate degree: academic program  Occupational History  . Occupation: retired  Tobacco Use  . Smoking status: Former Games developer  . Smokeless tobacco: Never Used  . Tobacco comment: quit 1965  Substance and Sexual Activity  . Alcohol use: No  . Drug use: No  . Sexual activity: Not on file  Other Topics Concern  . Not on file  Social History Narrative   04/02/19 lives alone, family asists   Patient drinks 1-2 cups of caffeine daily.   Patient is right handed.    Social Determinants of Health   Financial Resource Strain:   . Difficulty of Paying Living Expenses:   Food Insecurity:   . Worried About Programme researcher, broadcasting/film/video in the Last Year:   . Barista in the Last Year:   [employer]  Needs:   . Lack of Transportation (Medical):   [name] Lack of Transportation (Non-Medical):   Physical Activity:   . Days of Exercise per Week:   . Minutes of Exercise per Session:   Stress:   . Feeling of Stress :   Social Connections:   . Frequency of Communication with Friends and Family:   . Frequency of Social Gatherings with Friends and Family:   . Attends Religious Services:   . Active Member of Clubs or Organizations:   . Attends Archivist Meetings:   [name] Marital Status:      Family History: The patient's family history includes Brain cancer in her brother; Heart attack in her brother and father; Heart disease in her brother, father, and mother; Hypertension in her brother, mother, and sister; Melanoma in her brother; Ovarian cancer in her sister; Rheum arthritis in her sister; Stroke in her mother. There is no history of Colon cancer.  ROS:   Please see the history of present illness.    ROS  All  other systems reviewed and negative.   EKGs/Labs/Other Studies Reviewed:    The following studies were reviewed today: Outside labs, 2D echo  EKG:  EKG is not ordered today.   Recent Labs: 12/26/2018: Magnesium 2.1 12/27/2018: TSH 5.185 05/08/2019: ALT 29 05/10/2019: Hemoglobin 12.1; Platelets 223 05/12/2019: BUN 14; Creatinine, Ser 1.18; Potassium 4.4; Sodium 142   Recent Lipid Panel    Component Value Date/Time   CHOL 209 (H) 12/27/2018 0145   TRIG 89 12/27/2018 0145   HDL 57 12/27/2018 0145   CHOLHDL 3.7 12/27/2018 0145   VLDL 18 12/27/2018 0145   LDLCALC 134 (H) 12/27/2018 0145    Physical Exam:    VS:  BP 134/80   Pulse 85   Ht 5' 6.5" (1.689 m)   Wt 117 lb 12.8 oz (53.4 kg)   SpO2 95%   BMI 18.73 kg/m     Wt Readings from Last 3 Encounters:  09/17/19 117 lb 12.8 oz (53.4 kg)  08/12/19 122 lb (55.3 kg)  05/09/19 124 lb 1.9 oz (56.3 kg)     GEN:  Well nourished, well developed in no acute distress HEENT: Normal NECK: No JVD; No carotid bruits LYMPHATICS: No lymphadenopathy CARDIAC: RRR, no murmurs, rubs, gallops RESPIRATORY:  Clear to auscultation without rales, wheezing or rhonchi  ABDOMEN: Soft, non-tender, non-distended MUSCULOSKELETAL:  No edema; No deformity  SKIN: Warm and dry NEUROLOGIC:  Alert and oriented x 3 PSYCHIATRIC:  Normal affect   ASSESSMENT:    1. Paroxysmal atrial fibrillation (HCC)   2. Essential hypertension   3. Nonrheumatic mitral valve regurgitation   4. Stage 3a chronic kidney disease   5. DCM (dilated cardiomyopathy) ([place])   6. Mobitz type II atrioventricular block    PLAN:    In order of problems listed above:  1.  PAF -s/p maze procedure at the time of her MV repair -maintaining NSR on exam and denies any palpitations -denies any bleeding problems on DOAC -continue apixaban 2.5mg  BID  2.  HTN -BP controlled on exam -currently not on antihypertensive therapy  3.  Mitral Regurgitation -s/p MV repair with  annuloplasty ring -stable repair with trivial MR by echo 12/2018  4.  Stage 3a CKD -followed by nephrology  5.  DCM -normal coronary arteries at time of cath for MR in 03/2017 -EF 45-50% with mid-apical AS wll and apical AK on echo 12/2018 -stable from echo 07/2017  6.  Mobitz type 2 AVB -  s/p dual chamber PPM -followed in device clinic   Medication Adjustments/Labs and Tests Ordered: Current medicines are reviewed at length with the patient today.  Concerns regarding medicines are outlined above.  No orders of the defined types were placed in this encounter.  No orders of the defined types were placed in this encounter.   Signed, Armanda Magic, MD  09/17/2019 6:09 PM    [facility]

## 2019-10-14 ENCOUNTER — Telehealth: Payer: Self-pay | Admitting: Neurology

## 2019-10-14 NOTE — Telephone Encounter (Signed)
I  received the call from her daughter Amanda [name], that patient was started on Remeron 15mg  every night recently.  She was noted to have dry mouth, fatigue, wants to lie down, not doing well  She is also on Lexapro 5mg  daily from Dr. ,   Her daughter is worried about the change are due to combination of Lexapro and remeron.   I have suggested her communicate with physician at facility to discuss above concern.  She would also want a call from Dr. , she understand it likely will be next week.

## 2019-10-15 NOTE — Telephone Encounter (Signed)
I called the daughter.  The patient is not tolerating mirtazapine due to dry mouth and hoarseness.  She was probably put on medication to help boost appetite, this is a significant issue for her.  She has been on Depakote before and did not tolerate the drug.  I have recommended possibly going on Megace for appetite.  They will talk to her treating physician.

## 2019-11-16 ENCOUNTER — Ambulatory Visit (INDEPENDENT_AMBULATORY_CARE_PROVIDER_SITE_OTHER): Payer: Medicare Other | Admitting: *Deleted

## 2019-11-16 DIAGNOSIS — R001 Bradycardia, unspecified: Secondary | ICD-10-CM | POA: Diagnosis not present

## 2019-11-17 LAB — CUP PACEART REMOTE DEVICE CHECK
Battery Remaining Longevity: 114 mo
Battery Remaining Percentage: 95.5 %
Battery Voltage: 2.99 V
Brady Statistic AP VP Percent: 2.8 %
Brady Statistic AP VS Percent: 1 %
Brady Statistic AS VP Percent: 95 %
Brady Statistic AS VS Percent: 1.2 %
Brady Statistic RA Percent Paced: 2.2 %
Brady Statistic RV Percent Paced: 98 %
Date Time Interrogation Session: 20210510194128
Implantable Lead Implant Date: 20160701
Implantable Lead Implant Date: 20160701
Implantable Lead Location: 753859
Implantable Lead Location: 753860
Implantable Pulse Generator Implant Date: 20160701
Lead Channel Impedance Value: 360 Ohm
Lead Channel Impedance Value: 490 Ohm
Lead Channel Pacing Threshold Amplitude: 0.75 V
Lead Channel Pacing Threshold Amplitude: 0.875 V
Lead Channel Pacing Threshold Pulse Width: 0.4 ms
Lead Channel Pacing Threshold Pulse Width: 0.4 ms
Lead Channel Sensing Intrinsic Amplitude: 12 mV
Lead Channel Sensing Intrinsic Amplitude: 4.5 mV
Lead Channel Setting Pacing Amplitude: 1.125
Lead Channel Setting Pacing Amplitude: 2 V
Lead Channel Setting Pacing Pulse Width: 0.4 ms
Lead Channel Setting Sensing Sensitivity: 2 mV
Pulse Gen Model: 2240
Pulse Gen Serial Number: 7785935

## 2019-11-17 NOTE — Progress Notes (Signed)
Remote pacemaker transmission.   

## 2019-12-15 ENCOUNTER — Telehealth (HOSPITAL_COMMUNITY): Payer: Self-pay | Admitting: *Deleted

## 2019-12-28 ENCOUNTER — Telehealth: Payer: Self-pay | Admitting: Neurology

## 2019-12-28 NOTE — Telephone Encounter (Signed)
Pt's daughter Arline Asp on [place] called needing to discuss the pt's escitalopram (LEXAPRO) 5 MG tablet with RN. Please advise.

## 2019-12-29 NOTE — Telephone Encounter (Signed)
I called pts daughter Arline Asp about her mom lexapro. She stated pt is at [facility] and has lost a lot of weight. PT is down to 111 pounds and has no appetite.She wanted to know could Dr. Anne Hahn increase the lexapro up from 5mg . She stated her mom is real sensitive to medications and wanted to know can he increase it or try another medication. PT currently has dementia. I stated message will be sent to Dr.Willis at [phone].

## 2019-12-29 NOTE — Telephone Encounter (Signed)
I called again, left another message.  I will call back later.

## 2019-12-29 NOTE — Telephone Encounter (Signed)
I called and left a message, I will call back later. 

## 2019-12-30 NOTE — Telephone Encounter (Signed)
I called and talk with the daughter.  The patient resides at [facility], she has had ongoing poor appetite, ongoing weight loss.  She is on Lexapro and tolerates 5 mg dosing, we can go to 10 mg daily.  Previously, she did not tolerate mirtazapine or Depakote.  Addition of Megace it may not be unreasonable.

## 2019-12-31 MED ORDER — MEGESTROL ACETATE 40 MG PO TABS: 40.0000 mg | ORAL_TABLET | Freq: Every day | ORAL | 3 refills | Status: DC

## 2019-12-31 MED ORDER — ESCITALOPRAM OXALATE 10 MG PO TABS: 10.0000 mg | ORAL_TABLET | Freq: Every day | ORAL | 3 refills | Status: DC

## 2019-12-31 NOTE — Telephone Encounter (Addendum)
I called [facility] at [phone] where pt resides in the memory care unit. I stated Dr.Wllis has two prescriptions that need to be fax for the pt. They gave fax number of [phone]. Prescription will be fax once machine is working.

## 2019-12-31 NOTE — Telephone Encounter (Addendum)
I called [facility], left a message that I want to go up on the Lexapro to 10 mg daily, add 40 mg of Megace daily.  The phone number was [phone].  I left a message, no one answered the phone.  I will fax over prescription.

## 2019-12-31 NOTE — Addendum Note (Signed)
Addended by: York Spaniel on: 12/31/2019 01:21 PM   Modules accepted: Orders

## 2019-12-31 NOTE — Telephone Encounter (Signed)
Revised. 

## 2020-01-05 NOTE — Telephone Encounter (Signed)
I fax orders to [facility] for  lexapro and megace to [phone] three times and confirmed. Fax machine and phones down on 12/31/2019 and was unable to fax.

## 2020-01-17 ENCOUNTER — Other Ambulatory Visit: Payer: Self-pay

## 2020-01-17 ENCOUNTER — Encounter (HOSPITAL_COMMUNITY): Payer: Self-pay | Admitting: *Deleted

## 2020-01-17 ENCOUNTER — Emergency Department (HOSPITAL_COMMUNITY)
Admission: EM | Admit: 2020-01-17 | Discharge: 2020-01-17 | Disposition: A | Discharge status: Home / Self Care | Payer: Medicare Other | Attending: Emergency Medicine | Admitting: Emergency Medicine

## 2020-01-17 ENCOUNTER — Emergency Department (HOSPITAL_COMMUNITY): Payer: Medicare Other

## 2020-01-17 DIAGNOSIS — F0390 Unspecified dementia without behavioral disturbance: Secondary | ICD-10-CM | POA: Insufficient documentation

## 2020-01-17 DIAGNOSIS — I480 Paroxysmal atrial fibrillation: Secondary | ICD-10-CM | POA: Insufficient documentation

## 2020-01-17 DIAGNOSIS — Z79899 Other long term (current) drug therapy: Secondary | ICD-10-CM | POA: Insufficient documentation

## 2020-01-17 DIAGNOSIS — Y999 Unspecified external cause status: Secondary | ICD-10-CM | POA: Insufficient documentation

## 2020-01-17 DIAGNOSIS — Z87891 Personal history of nicotine dependence: Secondary | ICD-10-CM | POA: Diagnosis not present

## 2020-01-17 DIAGNOSIS — I129 Hypertensive chronic kidney disease with stage 1 through stage 4 chronic kidney disease, or unspecified chronic kidney disease: Secondary | ICD-10-CM | POA: Insufficient documentation

## 2020-01-17 DIAGNOSIS — X58XXXA Exposure to other specified factors, initial encounter: Secondary | ICD-10-CM | POA: Diagnosis not present

## 2020-01-17 DIAGNOSIS — Z7951 Long term (current) use of inhaled steroids: Secondary | ICD-10-CM | POA: Diagnosis not present

## 2020-01-17 DIAGNOSIS — E039 Hypothyroidism, unspecified: Secondary | ICD-10-CM | POA: Insufficient documentation

## 2020-01-17 DIAGNOSIS — Y939 Activity, unspecified: Secondary | ICD-10-CM | POA: Insufficient documentation

## 2020-01-17 DIAGNOSIS — S022XXA Fracture of nasal bones, initial encounter for closed fracture: Secondary | ICD-10-CM | POA: Insufficient documentation

## 2020-01-17 DIAGNOSIS — Y929 Unspecified place or not applicable: Secondary | ICD-10-CM | POA: Insufficient documentation

## 2020-01-17 DIAGNOSIS — Z8673 Personal history of transient ischemic attack (TIA), and cerebral infarction without residual deficits: Secondary | ICD-10-CM | POA: Insufficient documentation

## 2020-01-17 DIAGNOSIS — Z7901 Long term (current) use of anticoagulants: Secondary | ICD-10-CM | POA: Insufficient documentation

## 2020-01-17 DIAGNOSIS — Z79890 Hormone replacement therapy (postmenopausal): Secondary | ICD-10-CM | POA: Insufficient documentation

## 2020-01-17 DIAGNOSIS — N183 Chronic kidney disease, stage 3 unspecified: Secondary | ICD-10-CM | POA: Diagnosis not present

## 2020-01-17 DIAGNOSIS — S0992XA Unspecified injury of nose, initial encounter: Secondary | ICD-10-CM | POA: Diagnosis present

## 2020-01-17 DIAGNOSIS — T148XXA Other injury of unspecified body region, initial encounter: Secondary | ICD-10-CM

## 2020-01-17 MED ORDER — ACETAMINOPHEN 325 MG PO TABS
650.0000 mg | ORAL_TABLET | Freq: Once | ORAL | Status: AC
  Administered 2020-01-17: 650 mg via ORAL
  Filled 2020-01-17: qty 2

## 2020-01-17 MED ORDER — LIDOCAINE-EPINEPHRINE (PF) 2 %-1:200000 IJ SOLN
10.0000 mL | Freq: Once | INTRAMUSCULAR | Status: AC
  Administered 2020-01-17: 10 mL
  Filled 2020-01-17: qty 20

## 2020-01-17 MED ORDER — BACITRACIN ZINC 500 UNIT/GM EX OINT: TOPICAL_OINTMENT | Freq: Two times a day (BID) | CUTANEOUS | Status: DC

## 2020-01-17 NOTE — ED Triage Notes (Signed)
Pt fell out side today on concrete surface. Pt arrived with a hematoma located mis fore head. Pt also has a injury to surface of nose. Pt face covered with dried blood. Pt alert to self only. EMS BP 170/88, HR 94 , SPO2 100% RA.

## 2020-01-17 NOTE — ED Notes (Signed)
Patient verbalizes understanding of discharge instructions . Opportunity for questions and answers were provided . Armband removed by staff ,Pt discharged from ED. W/C  offered at D/C  and Declined W/C at D/C and was escorted to lobby by RN.  

## 2020-01-17 NOTE — ED Notes (Signed)
Patient transported to CT 

## 2020-01-17 NOTE — ED Provider Notes (Signed)
..  Laceration Repair  Date/Time: 01/17/2020 6:38 PM Performed by: Sherene Sires, PA-C Authorized by: Derwood Kaplan, MD   Consent:    Consent obtained:  Verbal   Consent given by:  Patient   Risks discussed:  Infection, poor cosmetic result and poor wound healing   Alternatives discussed:  No treatment Anesthesia (see MAR for exact dosages):    Anesthesia method:  Local infiltration   Local anesthetic:  Lidocaine 2% WITH epi Laceration details:    Location:  Face   Face location:  Nose   Length (cm):  2.5   Depth (mm):  2 Repair type:    Repair type:  Simple Pre-procedure details:    Preparation:  Patient was prepped and draped in usual sterile fashion Exploration:    Hemostasis achieved with:  Epinephrine   Wound exploration: wound explored through full range of motion and entire depth of wound probed and visualized     Contaminated: no   Treatment:    Area cleansed with:  Saline   Amount of cleaning:  Standard   Irrigation solution:  Sterile saline   Irrigation method:  Syringe   Visualized foreign bodies/material removed: no   Skin repair:    Repair method:  Sutures   Suture size:  4-0   Suture material:  Fast-absorbing gut   Number of sutures:  3 Approximation:    Approximation:  Close Post-procedure details:    Dressing:  Open (no dressing)   Patient tolerance of procedure:  Tolerated well, no immediate complications      Kathyrn Lass 01/17/20 1840    Derwood Kaplan, MD 01/17/20 2123

## 2020-01-17 NOTE — ED Provider Notes (Signed)
[facility] EMERGENCY DEPARTMENT Provider Note   CSN: [id number] Arrival date & time: 01/17/20  1427     History Chief Complaint  Patient presents with  . Fall    Amanda [name] is a 79 y.o. female.  HPI     79 year old female comes in a chief complaint of fall.  Level 5 caveat for dementia. Patient has history of hypertension, hyperlipidemia, PAF on Eliquis, stroke and dementia.  Patient resides at a nursing facility.  Per EMS, patient had a mechanical fall while outside.  She fell onto concrete surface, and started having heavy bleeding.  Patient is complaining of headache and pain to her nose.  She denies any numbness, tingling, lower extremity pain, upper extremity pain.   Past Medical History:  Diagnosis Date  . Allergic rhinitis   . Anemia   . Asymptomatic LV dysfunction    EF 45-50% echo 07/2017  . CKD (chronic kidney disease), stage III    stage III  . DJD (degenerative joint disease)   . Glomerulonephritis    Dr [name]  . Hyperlipidemia   . Hyperparathyroidism (HCC)   . Hypertension   . Hypothyroidism   . IBS (irritable bowel syndrome)   . Interstitial cystitis   . Mitral regurgitation   . Mobitz II    a. s/p STJ dual chamber PPM   . MVP (mitral valve prolapse)    moderate posterior MVP with moderate MR and grade II diasotlic dysfunction  . PAC (premature atrial contraction)   . PAF (paroxysmal atrial fibrillation) (HCC)     note on pacer check. CHADS2VASC score is 4 now on Eliquis.  [name] PVC's (premature ventricular contractions)   . S/P minimally invasive maze operation for atrial fibrillation 04/16/2017   Complete bilateral atrial lesion set using cryothermy and bipolar radiofrequency ablation with clipping of LA appendage via right mini thoracotomy approach  . S/P minimally invasive mitral valve repair 04/16/2017   Complex valvuloplasty including triangular resection of posterior leaflet, artificial Gore-tex neochords x6 and Sorin  Memo 3D ring annuloplasty ([id number], size 32, serial # [id number])  . S/P placement of cardiac pacemaker   . Small vessel disease, cerebrovascular   . Stroke [facility])    a. old stroke seen on imaging.    Patient Active Problem List   Diagnosis Date Noted  . SIRS (systemic inflammatory response syndrome) (HCC) 05/08/2019  . Acute metabolic encephalopathy 05/08/2019  . Dementia (HCC) 12/27/2018  . PVC's (premature ventricular contractions) 04/16/2018  . Pleural effusion on right 05/22/2017  . Pleural effusion 05/22/2017  . Chronic anticoagulation   . Oral thrush   . Debility 04/26/2017  . History of CVA (cerebrovascular accident)   . S/P placement of cardiac pacemaker   . Post-operative pain   . Labile blood glucose   . S/P minimally invasive mitral valve repair + maze procedure 04/16/2017  . S/P minimally invasive maze operation for atrial fibrillation 04/16/2017  . Paroxysmal atrial fibrillation (HCC)   . Acute delirium 04/28/2015  . Acute encephalopathy 04/01/2015  . Leukocytosis 04/01/2015  . Hypothyroidism 04/01/2015  . CKD (chronic kidney disease), stage III 04/01/2015  . Pacemaker 02/03/2015  . AV block 01/06/2015  . RBBB 05/21/2014  . Mitral regurgitation 11/26/2013  . PAC (premature atrial contraction)   . DIARRHEA 07/24/2010  . GLOMERULONEPHRITIS 03/27/2010  . COLITIS 10/06/2009  . Hypertension 10/05/2009  . DYSPHAGIA UNSPECIFIED 10/05/2009  . Personal history of other endocrine, metabolic, and immunity disorders 10/05/2009    Past Surgical History:  Procedure Laterality Date  . ABDOMINAL HYSTERECTOMY    . BREAST BIOPSY Right   . CHEST TUBE INSERTION Right 05/23/2017   Procedure: INSERTION PLEURAL DRAINAGE CATHETER;  Surgeon: Purcell Nails, MD;  Location: [facility] OR;  Service: Thoracic;  Laterality: Right;  possible pleurex catheter  . CLIPPING OF ATRIAL APPENDAGE  04/16/2017   Procedure: CLIPPING OF ATRIAL APPENDAGE using AtriCure Pro2 clip 45;  Surgeon: Purcell Nails, MD;  Location: [facility] OR;  Service: Open Heart Surgery;;  . CYSTOSTOMY W/ BLADDER BIOPSY    . EP IMPLANTABLE DEVICE N/A 01/07/2015   STJ dual chamber pacemaker implanted by Dr [name] for 2:1 heart block  . MINIMALLY INVASIVE MAZE PROCEDURE N/A 04/16/2017   Procedure: MINIMALLY INVASIVE MAZE PROCEDURE;  Surgeon: Purcell Nails, MD;  Location: [facility] OR;  Service: Open Heart Surgery;  Laterality: N/A;  . MITRAL VALVE REPAIR Right 04/16/2017   Procedure: MINIMALLY INVASIVE MITRAL VALVE REPAIR (MVR);  Surgeon: Purcell Nails, MD;  Location: [facility] OR;  Service: Open Heart Surgery;  Laterality: Right;  . MITRAL VALVE REPAIR Right 04/16/2017   Procedure: RE-EXPLORATION RIGHT THORACOTOMY FOR BLEEDING;  Surgeon: Purcell Nails, MD;  Location: [facility] OR;  Service: Open Heart Surgery;  Laterality: Right;  . PERIPHERAL VASCULAR CATHETERIZATION N/A 02/03/2015   Procedure: Upper Extremity Venography;  Surgeon: [name], MD;  Location: [facility] INVASIVE CV LAB;  Service: Cardiovascular;  Laterality: N/A;  . REMOVAL OF PLEURAL DRAINAGE CATHETER Right 09/03/2017   Procedure: REMOVAL OF PLEURAL DRAINAGE CATHETER;  Surgeon: Purcell Nails, MD;  Location: [facility] OR;  Service: Thoracic;  Laterality: Right;  . RIGHT/LEFT HEART CATH AND CORONARY ANGIOGRAPHY N/A 03/26/2017   Procedure: RIGHT/LEFT HEART CATH AND CORONARY ANGIOGRAPHY;  Surgeon: [name], MD;  Location: [facility] INVASIVE CV LAB;  Service: Cardiovascular;  Laterality: N/A;  . TEE WITHOUT CARDIOVERSION N/A 03/18/2017   Procedure: TRANSESOPHAGEAL ECHOCARDIOGRAM (TEE);  Surgeon: [name], MD;  Location: [facility] ENDOSCOPY;  Service: Cardiovascular;  Laterality: N/A;  . TEE WITHOUT CARDIOVERSION N/A 04/16/2017   Procedure: TRANSESOPHAGEAL ECHOCARDIOGRAM (TEE);  Surgeon: Purcell Nails, MD;  Location: [facility] OR;  Service: Open Heart Surgery;  Laterality: N/A;  . TONSILLECTOMY       OB History   No obstetric history on file.     Family History  Problem Relation Age of  Onset  . Heart disease Mother   . Stroke Mother   . Hypertension Mother   . Heart disease Father   . Heart attack Father   . Brain cancer Brother   . Heart disease Brother   . Melanoma Brother   . Heart attack Brother   . Hypertension Brother   . Ovarian cancer Sister   . Hypertension Sister   . Rheum arthritis Sister   . Colon cancer Neg Hx     Social History   Tobacco Use  . Smoking status: Former Games developer  . Smokeless tobacco: Never Used  . Tobacco comment: quit 1965  Vaping Use  . Vaping Use: Never used  Substance Use Topics  . Alcohol use: No  . Drug use: No    Home Medications Prior to Admission medications   Medication Sig Start Date End Date Taking? Authorizing Provider  acetaminophen (TYLENOL) 500 MG tablet Take 500 mg by mouth as needed for moderate pain.     [provider]  apixaban (ELIQUIS) 2.5 MG TABS tablet Take 1 tablet (2.5 mg total) by mouth 2 (two) times daily. 04/22/19  Newman Nip, NP  calcitRIOL (ROCALTROL) 0.25 MCG capsule Take 0.25 mcg by mouth every other day. In the morning     [provider]  escitalopram (LEXAPRO) 10 MG tablet Take 1 tablet (10 mg total) by mouth daily. 12/31/19   York Spaniel, MD  fexofenadine (ALLEGRA) 180 MG tablet Take 180 mg by mouth daily.    [provider]  fluticasone (FLONASE) 50 MCG/ACT nasal spray Place 2 sprays into both nostrils as needed for allergies or rhinitis.     [provider]  levothyroxine (SYNTHROID, LEVOTHROID) 75 MCG tablet Take 75 mcg by mouth daily.    [provider]  megestrol (MEGACE) 40 MG tablet Take 1 tablet (40 mg total) by mouth daily. 12/31/19   York Spaniel, MD  potassium chloride SA (KLOR-CON) 20 MEQ tablet Take 40 mEq by mouth daily. 08/17/19   [provider]    Allergies    Pepcid [famotidine], Allopurinol, Amlodipine, Atorvastatin, Contrast media [iodinated diagnostic agents], Cephalexin, Ciprofloxacin, Colchicine,  Erythromycin, Prilosec [omeprazole], Sulfonamide derivatives, and Tetracycline  Review of Systems   Review of Systems  Unable to perform ROS: Dementia    Physical Exam Updated Vital Signs BP (!) 148/80 (BP Location: Right Arm)   Pulse 90   Temp 98.9 F (37.2 C) (Oral)   Resp 18   SpO2 100%   Physical Exam Vitals and nursing note reviewed.  Constitutional:      Appearance: She is well-developed.  HENT:     Head: Normocephalic and atraumatic.  Eyes:     Pupils: Pupils are equal, round, and reactive to light.  Cardiovascular:     Rate and Rhythm: Normal rate.  Pulmonary:     Effort: Pulmonary effort is normal.  Abdominal:     General: Bowel sounds are normal.  Musculoskeletal:        General: Swelling and tenderness present.     Cervical back: Normal range of motion and neck supple.     Comments: Patient is a large frontal hematoma and bleeding from her nasal bridge.  No clear deformity over the nose. no spine step offs, crepitus of the chest or neck, no tenderness to palpation of the bilateral upper and lower extremities, no gross deformities, no chest tenderness, no pelvic pain.   Skin:    General: Skin is warm and dry.     Findings: Bruising present.  Neurological:     Mental Status: She is alert. Mental status is at baseline.     ED Results / Procedures / Treatments   Labs (all labs ordered are listed, but only abnormal results are displayed) Labs Reviewed - No data to display  EKG None  Radiology CT Head Wo Contrast  Result Date: 01/17/2020 CLINICAL DATA:  Larey Seat, frontal scalp hematoma EXAM: CT HEAD WITHOUT CONTRAST CT MAXILLOFACIAL WITHOUT CONTRAST TECHNIQUE: Multidetector CT imaging of the head and maxillofacial structures were performed using the standard protocol without intravenous contrast. Multiplanar CT image reconstructions of the maxillofacial structures were also generated. COMPARISON:  05/08/2019 FINDINGS: CT HEAD FINDINGS Brain: Patient motion  limits evaluation. Images were repeated. Chronic ischemic changes are seen throughout the periventricular white matter and right frontal cortex. No acute infarct or hemorrhage. Lateral ventricles and midline structures are stable. No acute extra-axial fluid collections. No mass effect. Vascular: No hyperdense vessel or unexpected calcification. Skull: There is a large midline frontal scalp hematoma. No underlying fracture. Remainder of the calvarium is unremarkable. Other: None. CT MAXILLOFACIAL FINDINGS Osseous: There is  a minimally displaced and comminuted nasal bone fracture. No other acute bony abnormalities. Orbits: Negative. No traumatic or inflammatory finding. Sinuses: Paranasal sinuses are clear. Soft tissues: Large midline frontal scalp hematoma. Mild soft tissue swelling over the nasal bridge, with small laceration. IMPRESSION: 1. Large midline frontal scalp hematoma. No underlying fracture. 2. Minimally displaced and comminuted nasal bone fracture. 3. No acute intracranial process. Electronically Signed   By: Sharlet Salina M.D.   On: 01/17/2020 16:07   DG Chest Port 1 View  Result Date: 01/17/2020 CLINICAL DATA:  Pain following fall EXAM: PORTABLE CHEST 1 VIEW COMPARISON:  May 08, 2019 FINDINGS: There is no edema or airspace opacity. Heart is upper normal in size with pulmonary vascularity normal. Pacemaker leads are attached to the right atrium and right ventricle. Patient is status post mitral valve replacement with left atrial appendage clamp present. There is no adenopathy. There is aortic atherosclerosis. There are apparent skin folds on each side without demonstrable pneumothorax. No bone lesions. IMPRESSION: Apparent skin folds bilaterally without discernible pneumothorax. No edema or airspace opacity. Stable cardiac silhouette with postoperative changes. Electronically Signed   By: Bretta Bang III M.D.   On: 01/17/2020 16:27   CT Maxillofacial WO CM  Result Date:  01/17/2020 CLINICAL DATA:  Larey Seat, frontal scalp hematoma EXAM: CT HEAD WITHOUT CONTRAST CT MAXILLOFACIAL WITHOUT CONTRAST TECHNIQUE: Multidetector CT imaging of the head and maxillofacial structures were performed using the standard protocol without intravenous contrast. Multiplanar CT image reconstructions of the maxillofacial structures were also generated. COMPARISON:  05/08/2019 FINDINGS: CT HEAD FINDINGS Brain: Patient motion limits evaluation. Images were repeated. Chronic ischemic changes are seen throughout the periventricular white matter and right frontal cortex. No acute infarct or hemorrhage. Lateral ventricles and midline structures are stable. No acute extra-axial fluid collections. No mass effect. Vascular: No hyperdense vessel or unexpected calcification. Skull: There is a large midline frontal scalp hematoma. No underlying fracture. Remainder of the calvarium is unremarkable. Other: None. CT MAXILLOFACIAL FINDINGS Osseous: There is a minimally displaced and comminuted nasal bone fracture. No other acute bony abnormalities. Orbits: Negative. No traumatic or inflammatory finding. Sinuses: Paranasal sinuses are clear. Soft tissues: Large midline frontal scalp hematoma. Mild soft tissue swelling over the nasal bridge, with small laceration. IMPRESSION: 1. Large midline frontal scalp hematoma. No underlying fracture. 2. Minimally displaced and comminuted nasal bone fracture. 3. No acute intracranial process. Electronically Signed   By: Sharlet Salina M.D.   On: 01/17/2020 16:07    Procedures Procedures (including critical care time)  Medications Ordered in ED Medications  lidocaine-EPINEPHrine (XYLOCAINE W/EPI) 2 %-1:200000 (PF) injection 10 mL (10 mLs Infiltration Given 01/17/20 1605)  acetaminophen (TYLENOL) tablet 650 mg (650 mg Oral Given 01/17/20 1605)    ED Course  I have reviewed the triage vital signs and the nursing notes.  Pertinent labs & imaging results that were available during  my care of the patient were reviewed by me and considered in my medical decision making (see chart for details).    MDM Rules/Calculators/A&P                          DDx includes: - Mechanical falls - ICH - Fractures - Contusions - Soft tissue injury  79 year old comes in a chief complaint of fall. Patient is noted to have bleeding from nose, externally and also large hematoma.  Suspect that she likely has a small nasal fracture.  CT head and face ordered and it  confirms slightly displaced nasal fracture.  Patient does not have septal hematoma.  Laceration was repaired by the APP.  Patient's Tdap is up-to-date.  She is stable for discharge.  Final Clinical Impression(s) / ED Diagnoses Final diagnoses:  Closed fracture of nasal bone, initial encounter  Hematoma    Rx / DC Orders ED Discharge Orders    None       Derwood Kaplan, MD 01/17/20 [place]

## 2020-01-17 NOTE — Discharge Instructions (Signed)
Apply bacitracin to the facial wound twice a day starting tomorrow night.  Keep the current dressing on for 24 hours.  We recommend that you do not give Amanda [name] [employer] and tomorrow morning, resume it tomorrow night.  Sutures shall dissolve on their own in 1 to 2 weeks.

## 2020-02-09 ENCOUNTER — Ambulatory Visit: Payer: Medicare Other | Admitting: Neurology

## 2020-02-09 ENCOUNTER — Encounter: Payer: Self-pay | Admitting: Neurology

## 2020-02-09 VITALS — BP 132/73 | HR 87 | Ht 66.0 in | Wt 109.0 lb

## 2020-02-09 DIAGNOSIS — F0390 Unspecified dementia without behavioral disturbance: Secondary | ICD-10-CM | POA: Diagnosis not present

## 2020-02-09 DIAGNOSIS — I679 Cerebrovascular disease, unspecified: Secondary | ICD-10-CM | POA: Diagnosis not present

## 2020-02-09 MED ORDER — ESCITALOPRAM OXALATE 10 MG PO TABS: 10.0000 mg | ORAL_TABLET | Freq: Every day | ORAL | 5 refills | Status: AC

## 2020-02-09 MED ORDER — MEGESTROL ACETATE 40 MG PO TABS: 40.0000 mg | ORAL_TABLET | Freq: Every day | ORAL | 5 refills | Status: AC

## 2020-02-09 NOTE — Patient Instructions (Signed)
Continue current medications Monitor her weight Return in 6 months or sooner if needed

## 2020-02-09 NOTE — Progress Notes (Signed)
PATIENT: Amanda [name] DOB: 1940-07-15  REASON FOR VISIT: follow up HISTORY FROM: patient  HISTORY OF PRESENT ILLNESS: Today 02/09/20  Amanda [name] is an 79 year old female with history of cerebrovascular disease with right parietal stroke and extensive small vessel disease.  She has a progressive dementing illness. She is living at [facility] in memory care.  She has poor appetite and weight loss.  She is on Lexapro, has not tolerated mirtazapine or Depakote. Dr. Anne Hahn put her on Megace.  Had a recent mechanical fall, CT head and face showed a slightly displaced nasal fracture. She couldn't tolerate the Namenda due to dizziness, decreased appetite.  Her weight has stabilized, if anything she has gained a few pounds, her appetite is increased.  Her weight today was 109 lbs. she is overall doing well, participates in activities at the facility.  Her mood is overall pleasant.  She sleeps well.  She has been at the facility since March, if anything, her walking has improved, walks frequently.  Presents today for evaluation with her daughter Olegario Messier, other daughter Arline Asp, is on Face time.   HISTORY 08/12/2019 Dr. Anne Hahn: Ms. Viscardi is an 79 year old right-handed white female with a history of cerebrovascular disease with a right parietal stroke and extensive small vessel disease.  The patient has a progressive dementing illness that it is probably at least in part related to vascular dementia.  The patient was in the hospital on 07 May 2019 with a fever up to 103 and a white count of 14.8.  She was treated with antibiotics for presumed sepsis although the source of this was never determined.  The patient is living at home, she requires assistance with her medications, appointments, and some assistance with bathing and dressing.  The patient continues to lose weight as she has a poor appetite, she has lost 5 pounds since last visit.  She is having some problems with feeling fidgety throughout  the day, she may have some visual hallucinations at times but this does not result in severe agitation.  The patient is sleeping well at night.  She does have occasional headaches which this seems to be related to missing a meal.  If she eats the headache improves.  She returns to the office today for an evaluation.   REVIEW OF SYSTEMS: Out of a complete 14 system review of symptoms, the patient complains only of the following symptoms, and all other reviewed systems are negative.  Memory loss, weight loss, decreased appetite  ALLERGIES: Allergies  Allergen Reactions  . Pepcid [Famotidine] Anaphylaxis, Swelling and Other (See Comments)    Causes "Throat Swelling"   . Allopurinol Other (See Comments)    Caused headaches  . Amlodipine Other (See Comments)    Results in PEDAL EDEMA   . Atorvastatin Other (See Comments)    Causes her leg muscles to ache  . Contrast Media [Iodinated Diagnostic Agents] Hives    IVP dye per patient  . Cephalexin Itching and Rash  . Ciprofloxacin Itching and Rash  . Colchicine Rash  . Erythromycin Itching and Rash  . Prilosec [Omeprazole] Nausea Only  . Sulfonamide Derivatives Itching and Rash  . Tetracycline Itching and Rash    HOME MEDICATIONS: Outpatient Medications Prior to Visit  Medication Sig Dispense Refill  . acetaminophen (TYLENOL) 500 MG tablet Take 500 mg by mouth as needed for moderate pain.     [name] apixaban (ELIQUIS) 2.5 MG TABS tablet Take 1 tablet (2.5 mg total) by mouth 2 (two)  times daily. 60 tablet 6  . calcitRIOL (ROCALTROL) 0.25 MCG capsule Take 0.25 mcg by mouth every other day. In the morning     . escitalopram (LEXAPRO) 10 MG tablet Take 1 tablet (10 mg total) by mouth daily. 30 tablet 3  . fluticasone (FLONASE) 50 MCG/ACT nasal spray Place 2 sprays into both nostrils as needed for allergies or rhinitis.     [name] levothyroxine (SYNTHROID, LEVOTHROID) 75 MCG tablet Take 75 mcg by mouth daily.    . megestrol (MEGACE) 40 MG tablet Take 1  tablet (40 mg total) by mouth daily. 30 tablet 3  . potassium chloride SA (KLOR-CON) 20 MEQ tablet Take 40 mEq by mouth daily.    . fexofenadine (ALLEGRA) 180 MG tablet Take 180 mg by mouth daily.     No facility-administered medications prior to visit.    PAST MEDICAL HISTORY: Past Medical History:  Diagnosis Date  . Allergic rhinitis   . Anemia   . Asymptomatic LV dysfunction    EF 45-50% echo 07/2017  . CKD (chronic kidney disease), stage III    stage III  . DJD (degenerative joint disease)   . Glomerulonephritis    Dr [name]  . Hyperlipidemia   . Hyperparathyroidism (HCC)   . Hypertension   . Hypothyroidism   . IBS (irritable bowel syndrome)   . Interstitial cystitis   . Mitral regurgitation   . Mobitz II    a. s/p STJ dual chamber PPM   . MVP (mitral valve prolapse)    moderate posterior MVP with moderate MR and grade II diasotlic dysfunction  . PAC (premature atrial contraction)   . PAF (paroxysmal atrial fibrillation) (HCC)     note on pacer check. CHADS2VASC score is 4 now on Eliquis.  [name] PVC's (premature ventricular contractions)   . S/P minimally invasive maze operation for atrial fibrillation 04/16/2017   Complete bilateral atrial lesion set using cryothermy and bipolar radiofrequency ablation with clipping of LA appendage via right mini thoracotomy approach  . S/P minimally invasive mitral valve repair 04/16/2017   Complex valvuloplasty including triangular resection of posterior leaflet, artificial Gore-tex neochords x6 and Sorin Memo 3D ring annuloplasty ([id number], size 32, serial # [id number])  . S/P placement of cardiac pacemaker   . Small vessel disease, cerebrovascular   . Stroke [facility])    a. old stroke seen on imaging.    PAST SURGICAL HISTORY: Past Surgical History:  Procedure Laterality Date  . ABDOMINAL HYSTERECTOMY    . BREAST BIOPSY Right   . CHEST TUBE INSERTION Right 05/23/2017   Procedure: INSERTION PLEURAL DRAINAGE CATHETER;  Surgeon: Purcell Nails, MD;  Location: [facility] OR;  Service: Thoracic;  Laterality: Right;  possible pleurex catheter  . CLIPPING OF ATRIAL APPENDAGE  04/16/2017   Procedure: CLIPPING OF ATRIAL APPENDAGE using AtriCure Pro2 clip 45;  Surgeon: Purcell Nails, MD;  Location: [facility] OR;  Service: Open Heart Surgery;;  . CYSTOSTOMY W/ BLADDER BIOPSY    . EP IMPLANTABLE DEVICE N/A 01/07/2015   STJ dual chamber pacemaker implanted by Dr [name] for 2:1 heart block  . MINIMALLY INVASIVE MAZE PROCEDURE N/A 04/16/2017   Procedure: MINIMALLY INVASIVE MAZE PROCEDURE;  Surgeon: Purcell Nails, MD;  Location: [facility] OR;  Service: Open Heart Surgery;  Laterality: N/A;  . MITRAL VALVE REPAIR Right 04/16/2017   Procedure: MINIMALLY INVASIVE MITRAL VALVE REPAIR (MVR);  Surgeon: Purcell Nails, MD;  Location: [facility] OR;  Service: Open Heart Surgery;  Laterality: Right;  . MITRAL  VALVE REPAIR Right 04/16/2017   Procedure: RE-EXPLORATION RIGHT THORACOTOMY FOR BLEEDING;  Surgeon: Purcell Nails, MD;  Location: [facility] OR;  Service: Open Heart Surgery;  Laterality: Right;  . PERIPHERAL VASCULAR CATHETERIZATION N/A 02/03/2015   Procedure: Upper Extremity Venography;  Surgeon: [name], MD;  Location: [facility] INVASIVE CV LAB;  Service: Cardiovascular;  Laterality: N/A;  . REMOVAL OF PLEURAL DRAINAGE CATHETER Right 09/03/2017   Procedure: REMOVAL OF PLEURAL DRAINAGE CATHETER;  Surgeon: Purcell Nails, MD;  Location: [facility] OR;  Service: Thoracic;  Laterality: Right;  . RIGHT/LEFT HEART CATH AND CORONARY ANGIOGRAPHY N/A 03/26/2017   Procedure: RIGHT/LEFT HEART CATH AND CORONARY ANGIOGRAPHY;  Surgeon: [name], MD;  Location: [facility] INVASIVE CV LAB;  Service: Cardiovascular;  Laterality: N/A;  . TEE WITHOUT CARDIOVERSION N/A 03/18/2017   Procedure: TRANSESOPHAGEAL ECHOCARDIOGRAM (TEE);  Surgeon: [name], MD;  Location: [facility] ENDOSCOPY;  Service: Cardiovascular;  Laterality: N/A;  . TEE WITHOUT CARDIOVERSION N/A 04/16/2017   Procedure:  TRANSESOPHAGEAL ECHOCARDIOGRAM (TEE);  Surgeon: Purcell Nails, MD;  Location: [facility] OR;  Service: Open Heart Surgery;  Laterality: N/A;  . TONSILLECTOMY      FAMILY HISTORY: Family History  Problem Relation Age of Onset  . Heart disease Mother   . Stroke Mother   . Hypertension Mother   . Heart disease Father   . Heart attack Father   . Brain cancer Brother   . Heart disease Brother   . Melanoma Brother   . Heart attack Brother   . Hypertension Brother   . Ovarian cancer Sister   . Hypertension Sister   . Rheum arthritis Sister   . Colon cancer Neg Hx     SOCIAL HISTORY: Social History   Socioeconomic History  . Marital status: Married    Spouse name: Not on file  . Number of children: 2  . Years of education: Not on file  . Highest education level: Associate degree: academic program  Occupational History  . Occupation: retired  Tobacco Use  . Smoking status: Former Games developer  . Smokeless tobacco: Never Used  . Tobacco comment: quit 1965  Vaping Use  . Vaping Use: Never used  Substance and Sexual Activity  . Alcohol use: No  . Drug use: No  . Sexual activity: Not on file  Other Topics Concern  . Not on file  Social History Narrative   04/02/19 lives alone, family asists   Patient drinks 1-2 cups of caffeine daily.   Patient is right handed.    Social Determinants of Health   Financial Resource Strain:   . Difficulty of Paying Living Expenses:   Food Insecurity:   . Worried About Programme researcher, broadcasting/film/video in the Last Year:   . Barista in the Last Year:   Transportation Needs:   . Freight forwarder (Medical):   [name] Lack of Transportation (Non-Medical):   Physical Activity:   . Days of Exercise per Week:   . Minutes of Exercise per Session:   Stress:   . Feeling of Stress :   Social Connections:   . Frequency of Communication with Friends and Family:   . Frequency of Social Gatherings with Friends and Family:   . Attends Religious Services:   .  Active Member of Clubs or Organizations:   . Attends Banker Meetings:   [name] Marital Status:   Intimate Partner Violence:   . Fear of Current or Ex-Partner:   . Emotionally Abused:   .  Physically Abused:   . Sexually Abused:    PHYSICAL EXAM  Vitals:   02/09/20 1239  BP: 132/73  Pulse: 87  Weight: 109 lb (49.4 kg)  Height: 5\' 6"  (1.676 m)   Body mass index is 17.59 kg/m.  Generalized: Well developed, in no acute distress  MMSE - Mini Mental State Exam 02/09/2020 04/02/2019 04/28/2015  Not completed: Unable to complete - -  Orientation to time - 4 5  Orientation to Place - 4 5  Registration - 3 3  Attention/ Calculation - 0 5  Recall - 1 3  Language- name 2 objects - 2 2  Language- repeat - 1 1  Language- follow 3 step command - 3 3  Language- read & follow direction - 1 1  Write a sentence - 0 1  Write a sentence-comments - no subject -  Copy design - 0 1  Total score - 19 30    Neurological examination  Mentation: Alert, oriented to birthday, history is provided by her daughter.  Speech is clear, difficulty with exam commands.  Is overall cooperative, pleasant, smiling. Cranial nerve II-XII: Pupils were equal round reactive to light. Extraocular movements were full, visual field were full on confrontational test. Facial sensation and strength were normal.  Head turning and shoulder shrug  were normal and symmetric. Motor: Strength is intact, no significant muscle weakness noted Sensory: Sensory testing is intact to soft touch on all 4 extremities. No evidence of extinction is noted.  Coordination: Difficulty performing exam commands.  Gait and station: Gait is wide-based, cautious Reflexes: Deep tendon reflexes are symmetric and normal bilaterally.   DIAGNOSTIC DATA (LABS, IMAGING, TESTING) - I reviewed patient records, labs, notes, testing and imaging myself where available.  Lab Results  Component Value Date   WBC 8.7 05/10/2019   HGB 12.1 05/10/2019     HCT 36.4 05/10/2019   MCV 92.6 05/10/2019   PLT 223 05/10/2019      Component Value Date/Time   NA 142 05/12/2019 1514   K 4.4 05/12/2019 1514   CL 104 05/12/2019 1514   CO2 27 05/12/2019 1514   GLUCOSE 96 05/12/2019 1514   GLUCOSE 96 05/10/2019 0440   BUN 14 05/12/2019 1514   CREATININE 1.18 (H) 05/12/2019 1514   CREATININE 1.43 (H) 03/16/2016 0841   CALCIUM 10.0 05/12/2019 1514   PROT 6.8 05/08/2019 0003   PROT 6.4 05/17/2017 1041   ALBUMIN 4.0 05/08/2019 0003   ALBUMIN 3.4 (L) 05/17/2017 1041   AST 42 (H) 05/08/2019 0003   ALT 29 05/08/2019 0003   ALKPHOS 70 05/08/2019 0003   BILITOT 1.0 05/08/2019 0003   BILITOT 0.7 05/17/2017 1041   GFRNONAA 44 (L) 05/12/2019 1514   GFRAA 50 (L) 05/12/2019 1514   Lab Results  Component Value Date   CHOL 209 (H) 12/27/2018   HDL 57 12/27/2018   LDLCALC 134 (H) 12/27/2018   TRIG 89 12/27/2018   CHOLHDL 3.7 12/27/2018   Lab Results  Component Value Date   HGBA1C 5.6 12/27/2018   Lab Results  Component Value Date   VITAMINB12 318 04/02/2019   Lab Results  Component Value Date   TSH 5.185 (H) 12/27/2018      ASSESSMENT AND PLAN 79 y.o. year old female  has a past medical history of Allergic rhinitis, Anemia, Asymptomatic LV dysfunction, CKD (chronic kidney disease), stage III, DJD (degenerative joint disease), Glomerulonephritis, Hyperlipidemia, Hyperparathyroidism (HCC), Hypertension, Hypothyroidism, IBS (irritable bowel syndrome), Interstitial cystitis, Mitral regurgitation, Mobitz II, MVP (  mitral valve prolapse), PAC (premature atrial contraction), PAF (paroxysmal atrial fibrillation) (HCC), PVC's (premature ventricular contractions), S/P minimally invasive maze operation for atrial fibrillation (04/16/2017), S/P minimally invasive mitral valve repair (04/16/2017), S/P placement of cardiac pacemaker, Small vessel disease, cerebrovascular, and Stroke (HCC). here with:  1.  Cerebrovascular disease 2.  Dementia 3.   Hallucinations  Memory is overall stable.  She will remain on Lexapro and Megace.  The weight loss has stabilized, if anything she has gained a few pounds.  Her weight was 109 pounds today.  I have provided printed prescriptions for Lexapro and Megace.  She will follow-up in 6 months or sooner if needed.  She is residing in a memory care unit at [facility].  I spent 30 minutes of face-to-face and non-face-to-face time with patient.  This included previsit chart review, lab review, study review, order entry, electronic health record documentation, patient education.  Margie Ege, AGNP-C, DNP 02/09/2020, 12:46 PM [facility] [address], [address] [place], [place] [address] [phone]

## 2020-02-09 NOTE — Progress Notes (Signed)
I have read the note, and I agree with the clinical assessment and plan.  Law Corsino K Sweetie Giebler   

## 2020-02-15 LAB — CUP PACEART REMOTE DEVICE CHECK
Battery Remaining Longevity: 114 mo
Battery Remaining Percentage: 95.5 %
Battery Voltage: 2.99 V
Brady Statistic AP VP Percent: 3.5 %
Brady Statistic AP VS Percent: 1 %
Brady Statistic AS VP Percent: 95 %
Brady Statistic AS VS Percent: 1 %
Brady Statistic RA Percent Paced: 2.9 %
Brady Statistic RV Percent Paced: 98 %
Date Time Interrogation Session: 20210809020013
Implantable Lead Implant Date: 20160701
Implantable Lead Implant Date: 20160701
Implantable Lead Location: 753859
Implantable Lead Location: 753860
Implantable Pulse Generator Implant Date: 20160701
Lead Channel Impedance Value: 380 Ohm
Lead Channel Impedance Value: 480 Ohm
Lead Channel Pacing Threshold Amplitude: 0.75 V
Lead Channel Pacing Threshold Amplitude: 1 V
Lead Channel Pacing Threshold Pulse Width: 0.4 ms
Lead Channel Pacing Threshold Pulse Width: 0.4 ms
Lead Channel Sensing Intrinsic Amplitude: 12 mV
Lead Channel Sensing Intrinsic Amplitude: 4.2 mV
Lead Channel Setting Pacing Amplitude: 1.25 V
Lead Channel Setting Pacing Amplitude: 2 V
Lead Channel Setting Pacing Pulse Width: 0.4 ms
Lead Channel Setting Sensing Sensitivity: 2 mV
Pulse Gen Model: 2240
Pulse Gen Serial Number: 7785935

## 2020-03-17 NOTE — Progress Notes (Signed)
Cardiology Office Note:    Date:  03/18/2020   ID:  Amanda [name], DOB 07-30-1940, MRN [id number]  PCP:  [name], MD  Cardiologist:  Armanda Magic, MD    Referring MD: [name], MD   Chief Complaint  Patient presents with  . Mitral Regurgitation  . Atrial Fibrillation  . Hypertension  . Cardiomyopathy    History of Present Illness:    Amanda [name] is a 79 y.o. female with a hx of severe MRs/pminimally invasive mitral valve repair and maze procedure 04/16/2017 for PAF. She had a complicated course requiring inpatient rehab and right thoracentesis. She then underwent right Pleurx catheter for recurrence. Also has history of PACs, PVCs,  Hypertension and CKD followed by nephrology.CHA2DS2-VASc 4on NOAC.  Her last echo 12/2018 showed mild LV dysfunction with EF 45-50% with mild apical anteroseptal and apical akinesis and stable MV repair with annuloplasty ring similar to prior echoes.   She is here today with her daughter for followup and is doing well.  She denies any chest pain or pressure, SOB, DOE, PND, orthopnea, LE edema, dizziness, palpitations or syncope. She is compliant with her meds and is tolerating meds with no SE.    Past Medical History:  Diagnosis Date  . Allergic rhinitis   . Anemia   . Asymptomatic LV dysfunction    EF 45-50% echo 07/2017  . CKD (chronic kidney disease), stage III    stage III  . DJD (degenerative joint disease)   . Glomerulonephritis    Dr [name]  . Hyperlipidemia   . Hyperparathyroidism (HCC)   . Hypertension   . Hypothyroidism   . IBS (irritable bowel syndrome)   . Interstitial cystitis   . Mitral regurgitation   . Mobitz II    a. s/p STJ dual chamber PPM   . MVP (mitral valve prolapse)    moderate posterior MVP with moderate MR and grade II diasotlic dysfunction  . PAC (premature atrial contraction)   . PAF (paroxysmal atrial fibrillation) (HCC)     note on pacer check. CHADS2VASC score is 4 now on  Eliquis.  [name] PVC's (premature ventricular contractions)   . S/P minimally invasive maze operation for atrial fibrillation 04/16/2017   Complete bilateral atrial lesion set using cryothermy and bipolar radiofrequency ablation with clipping of LA appendage via right mini thoracotomy approach  . S/P minimally invasive mitral valve repair 04/16/2017   Complex valvuloplasty including triangular resection of posterior leaflet, artificial Gore-tex neochords x6 and Sorin Memo 3D ring annuloplasty ([id number], size 32, serial # [id number])  . S/P placement of cardiac pacemaker   . Small vessel disease, cerebrovascular   . Stroke [facility])    a. old stroke seen on imaging.    Past Surgical History:  Procedure Laterality Date  . ABDOMINAL HYSTERECTOMY    . BREAST BIOPSY Right   . CHEST TUBE INSERTION Right 05/23/2017   Procedure: INSERTION PLEURAL DRAINAGE CATHETER;  Surgeon: Purcell Nails, MD;  Location: [facility] OR;  Service: Thoracic;  Laterality: Right;  possible pleurex catheter  . CLIPPING OF ATRIAL APPENDAGE  04/16/2017   Procedure: CLIPPING OF ATRIAL APPENDAGE using AtriCure Pro2 clip 45;  Surgeon: Purcell Nails, MD;  Location: [facility] OR;  Service: Open Heart Surgery;;  . CYSTOSTOMY W/ BLADDER BIOPSY    . EP IMPLANTABLE DEVICE N/A 01/07/2015   STJ dual chamber pacemaker implanted by Dr [name] for 2:1 heart block  . MINIMALLY INVASIVE MAZE PROCEDURE N/A 04/16/2017   Procedure: MINIMALLY INVASIVE MAZE  PROCEDURE;  Surgeon: Purcell Nails, MD;  Location: [facility] OR;  Service: Open Heart Surgery;  Laterality: N/A;  . MITRAL VALVE REPAIR Right 04/16/2017   Procedure: MINIMALLY INVASIVE MITRAL VALVE REPAIR (MVR);  Surgeon: Purcell Nails, MD;  Location: [facility] OR;  Service: Open Heart Surgery;  Laterality: Right;  . MITRAL VALVE REPAIR Right 04/16/2017   Procedure: RE-EXPLORATION RIGHT THORACOTOMY FOR BLEEDING;  Surgeon: Purcell Nails, MD;  Location: [facility] OR;  Service: Open Heart Surgery;  Laterality: Right;  . PERIPHERAL  VASCULAR CATHETERIZATION N/A 02/03/2015   Procedure: Upper Extremity Venography;  Surgeon: [name], MD;  Location: [facility] INVASIVE CV LAB;  Service: Cardiovascular;  Laterality: N/A;  . REMOVAL OF PLEURAL DRAINAGE CATHETER Right 09/03/2017   Procedure: REMOVAL OF PLEURAL DRAINAGE CATHETER;  Surgeon: Purcell Nails, MD;  Location: [facility] OR;  Service: Thoracic;  Laterality: Right;  . RIGHT/LEFT HEART CATH AND CORONARY ANGIOGRAPHY N/A 03/26/2017   Procedure: RIGHT/LEFT HEART CATH AND CORONARY ANGIOGRAPHY;  Surgeon: [name], MD;  Location: [facility] INVASIVE CV LAB;  Service: Cardiovascular;  Laterality: N/A;  . TEE WITHOUT CARDIOVERSION N/A 03/18/2017   Procedure: TRANSESOPHAGEAL ECHOCARDIOGRAM (TEE);  Surgeon: [name], MD;  Location: [facility] ENDOSCOPY;  Service: Cardiovascular;  Laterality: N/A;  . TEE WITHOUT CARDIOVERSION N/A 04/16/2017   Procedure: TRANSESOPHAGEAL ECHOCARDIOGRAM (TEE);  Surgeon: Purcell Nails, MD;  Location: [facility] OR;  Service: Open Heart Surgery;  Laterality: N/A;  . TONSILLECTOMY      Current Medications: Current Meds  Medication Sig  . acetaminophen (TYLENOL) 500 MG tablet Take 500 mg by mouth as needed for moderate pain.   [name] apixaban (ELIQUIS) 2.5 MG TABS tablet Take 1 tablet (2.5 mg total) by mouth 2 (two) times daily.  . calcitRIOL (ROCALTROL) 0.25 MCG capsule Take 0.25 mcg by mouth every other day. In the morning   . escitalopram (LEXAPRO) 10 MG tablet Take 1 tablet (10 mg total) by mouth daily.  . fluticasone (FLONASE) 50 MCG/ACT nasal spray Place 2 sprays into both nostrils as needed for allergies or rhinitis.   [name] levothyroxine (SYNTHROID, LEVOTHROID) 75 MCG tablet Take 75 mcg by mouth daily.  . megestrol (MEGACE) 40 MG tablet Take 1 tablet (40 mg total) by mouth daily.  . potassium chloride SA (KLOR-CON) 20 MEQ tablet Take 40 mEq by mouth daily.     Allergies:   Pepcid [famotidine], Allopurinol, Amlodipine, Atorvastatin, Contrast media [iodinated diagnostic  agents], Cephalexin, Ciprofloxacin, Colchicine, Erythromycin, Prilosec [omeprazole], Sulfonamide derivatives, and Tetracycline   Social History   Socioeconomic History  . Marital status: Married    Spouse name: Not on file  . Number of children: 2  . Years of education: Not on file  . Highest education level: Associate degree: academic program  Occupational History  . Occupation: retired  Tobacco Use  . Smoking status: Former Games developer  . Smokeless tobacco: Never Used  . Tobacco comment: quit 1965  Vaping Use  . Vaping Use: Never used  Substance and Sexual Activity  . Alcohol use: No  . Drug use: No  . Sexual activity: Not on file  Other Topics Concern  . Not on file  Social History Narrative   04/02/19 lives alone, family asists   Patient drinks 1-2 cups of caffeine daily.   Patient is right handed.    Social Determinants of Health   Financial Resource Strain:   . Difficulty of Paying Living Expenses: Not on file  Food Insecurity:   . Worried About Radiation protection practitioner  of Food in the Last Year: Not on file  . Ran Out of Food in the Last Year: Not on file  Transportation Needs:   . Lack of Transportation (Medical): Not on file  . Lack of Transportation (Non-Medical): Not on file  Physical Activity:   . Days of Exercise per Week: Not on file  . Minutes of Exercise per Session: Not on file  Stress:   . Feeling of Stress : Not on file  Social Connections:   . Frequency of Communication with Friends and Family: Not on file  . Frequency of Social Gatherings with Friends and Family: Not on file  . Attends Religious Services: Not on file  . Active Member of Clubs or Organizations: Not on file  . Attends Banker Meetings: Not on file  . Marital Status: Not on file     Family History: The patient's family history includes Brain cancer in her brother; Heart attack in her brother and father; Heart disease in her brother, father, and mother; Hypertension in her brother,  mother, and sister; Melanoma in her brother; Ovarian cancer in her sister; Rheum arthritis in her sister; Stroke in her mother. There is no history of Colon cancer.  ROS:   Please see the history of present illness.    ROS  All other systems reviewed and negative.   EKGs/Labs/Other Studies Reviewed:    The following studies were reviewed today: Outside labs, 2D echo  EKG:  EKG is ordered today and showed A sensing and V pacing  Recent Labs: 05/08/2019: ALT 29 05/10/2019: Hemoglobin 12.1; Platelets 223 05/12/2019: BUN 14; Creatinine, Ser 1.18; Potassium 4.4; Sodium 142   Recent Lipid Panel    Component Value Date/Time   CHOL 209 (H) 12/27/2018 0145   TRIG 89 12/27/2018 0145   HDL 57 12/27/2018 0145   CHOLHDL 3.7 12/27/2018 0145   VLDL 18 12/27/2018 0145   LDLCALC 134 (H) 12/27/2018 0145    Physical Exam:    VS:  BP 126/66   Pulse 92   Ht 5\' 6"  (1.676 m)   Wt 109 lb 9.6 oz (49.7 kg)   SpO2 96%   BMI 17.69 kg/m     Wt Readings from Last 3 Encounters:  03/18/20 109 lb 9.6 oz (49.7 kg)  02/09/20 109 lb (49.4 kg)  09/17/19 117 lb 12.8 oz (53.4 kg)     GEN: Well nourished, well developed in no acute distress HEENT: Normal NECK: No JVD; No carotid bruits LYMPHATICS: No lymphadenopathy CARDIAC:RRR, no murmurs, rubs, gallops RESPIRATORY:  Clear to auscultation without rales, wheezing or rhonchi  ABDOMEN: Soft, non-tender, non-distended MUSCULOSKELETAL:  No edema; No deformity  SKIN: Warm and dry NEUROLOGIC:  Alert and oriented x 3 PSYCHIATRIC:  Normal affect    ASSESSMENT:    1. Paroxysmal atrial fibrillation (HCC)   2. Essential hypertension   3. Rheumatic mitral regurgitation   4. Stage 3a chronic kidney disease   5. DCM (dilated cardiomyopathy) (HCC)   6. Mobitz type II atrioventricular block    PLAN:    In order of problems listed above:  1.  PAF -s/p maze procedure at the time of her MV repair -she continues to maintain NSR on exam and denies any  palpitations -denies any bleeding problems on DOAC -continue Apixaban 2.5mg  BID (wt<60kg and age>80) -check BMET, Hbg and TSH today  2.  HTN -BP controlled on exam -diet controlled on no antihypertensive meds  3.  Mitral Regurgitation -s/p MV repair with annuloplasty  ring -stable repair with trivial MR by echo 12/2018  4.  Stage 3a CKD -followed by nephrology  5.  DCM -normal coronary arteries at time of cath for MR in 03/2017 -EF 45-50% with mid-apical AS wll and apical AK on echo 12/2018 -She has had some weight loss and fatigue so will check a 2D echo to make sure LVF is stable  6.  Mobitz type 2 AVB -s/p dual chamber PPM -followed in device clinic   Medication Adjustments/Labs and Tests Ordered: Current medicines are reviewed at length with the patient today.  Concerns regarding medicines are outlined above.  Orders Placed This Encounter  Procedures  . EKG 12-Lead   No orders of the defined types were placed in this encounter.   Signed, Armanda Magic, MD  03/18/2020 2:28 PM    [facility]

## 2020-03-18 ENCOUNTER — Encounter: Payer: Self-pay | Admitting: Cardiology

## 2020-03-18 ENCOUNTER — Other Ambulatory Visit: Payer: Self-pay

## 2020-03-18 ENCOUNTER — Ambulatory Visit: Payer: Medicare Other | Admitting: Cardiology

## 2020-03-18 VITALS — BP 126/66 | HR 92 | Ht 66.0 in | Wt 109.6 lb

## 2020-03-18 DIAGNOSIS — I10 Essential (primary) hypertension: Secondary | ICD-10-CM | POA: Diagnosis not present

## 2020-03-18 DIAGNOSIS — I480 Paroxysmal atrial fibrillation: Secondary | ICD-10-CM | POA: Diagnosis not present

## 2020-03-18 DIAGNOSIS — N1831 Chronic kidney disease, stage 3a: Secondary | ICD-10-CM

## 2020-03-18 DIAGNOSIS — I441 Atrioventricular block, second degree: Secondary | ICD-10-CM

## 2020-03-18 DIAGNOSIS — I420 Dilated cardiomyopathy: Secondary | ICD-10-CM

## 2020-03-18 DIAGNOSIS — I051 Rheumatic mitral insufficiency: Secondary | ICD-10-CM | POA: Diagnosis not present

## 2020-03-18 NOTE — Addendum Note (Signed)
Addended by: Theresia Majors on: 03/18/2020 02:37 PM   Modules accepted: Orders

## 2020-03-18 NOTE — Patient Instructions (Signed)
Medication Instructions:  Your physician recommends that you continue on your current medications as directed. Please refer to the Current Medication list given to you today.  *If you need a refill on your cardiac medications before your next appointment, please call your pharmacy*  Lab Work: TODAY: BMET, CBC, and TSH If you have labs (blood work) drawn today and your tests are completely normal, you will receive your results only by: [name] MyChart Message (if you have MyChart) OR . A paper copy in the mail If you have any lab test that is abnormal or we need to change your treatment, we will call you to review the results.  Follow-Up: At [facility], you and your health needs are our priority.  As part of our continuing mission to provide you with exceptional heart care, we have created designated Provider Care Teams.  These Care Teams include your primary Cardiologist (physician) and Advanced Practice Providers (APPs -  Physician Assistants and Nurse Practitioners) who all work together to provide you with the care you need, when you need it.  We recommend signing up for the patient portal called "MyChart".  Sign up information is provided on this After Visit Summary.  MyChart is used to connect with patients for Virtual Visits (Telemedicine).  Patients are able to view lab/test results, encounter notes, upcoming appointments, etc.  Non-urgent messages can be sent to your provider as well.   To learn more about what you can do with MyChart, go to [email or web address].    Your next appointment:   6 month(s)  The format for your next appointment:   Virtual Visit   Provider:   You may see Armanda Magic, MD or one of the following Advanced Practice Providers on your designated Care Team:    Ronie Spies, PA-C  Jacolyn Reedy, PA-C

## 2020-03-19 LAB — BASIC METABOLIC PANEL
BUN/Creatinine Ratio: 25 (ref 12–28)
BUN: 35 mg/dL — ABNORMAL HIGH (ref 8–27)
CO2: 26 mmol/L (ref 20–29)
Calcium: 10.2 mg/dL (ref 8.7–10.3)
Chloride: 103 mmol/L (ref 96–106)
Creatinine, Ser: 1.4 mg/dL — ABNORMAL HIGH (ref 0.57–1.00)
GFR calc Af Amer: 41 mL/min/{1.73_m2} — ABNORMAL LOW (ref >59)
GFR calc non Af Amer: 36 mL/min/{1.73_m2} — ABNORMAL LOW (ref >59)
Glucose: 157 mg/dL — ABNORMAL HIGH (ref 65–99)
Potassium: 4.2 mmol/L (ref 3.5–5.2)
Sodium: 141 mmol/L (ref 134–144)

## 2020-03-19 LAB — CBC
Hematocrit: 36.5 % (ref 34.0–46.6)
Hemoglobin: 12.4 g/dL (ref 11.1–15.9)
MCH: 31.7 pg (ref 26.6–33.0)
MCHC: 34 g/dL (ref 31.5–35.7)
MCV: 93 fL (ref 79–97)
Platelets: 217 10*3/uL (ref 150–450)
RBC: 3.91 x10E6/uL (ref 3.77–5.28)
RDW: 12 % (ref 11.7–15.4)
WBC: 9.5 10*3/uL (ref 3.4–10.8)

## 2020-03-19 LAB — TSH: TSH: 0.871 u[IU]/mL (ref 0.450–4.500)

## 2020-04-21 ENCOUNTER — Ambulatory Visit (HOSPITAL_COMMUNITY)
Admission: RE | Admit: 2020-04-21 | Discharge: 2020-04-21 | Disposition: A | Discharge status: Home / Self Care | Payer: Medicare Other | Source: Ambulatory Visit | Attending: Nurse Practitioner | Admitting: Nurse Practitioner

## 2020-04-21 ENCOUNTER — Other Ambulatory Visit: Payer: Self-pay

## 2020-04-21 VITALS — BP 150/76 | HR 90 | Ht 66.0 in | Wt 110.8 lb

## 2020-04-21 DIAGNOSIS — Z8249 Family history of ischemic heart disease and other diseases of the circulatory system: Secondary | ICD-10-CM | POA: Diagnosis not present

## 2020-04-21 DIAGNOSIS — D6869 Other thrombophilia: Secondary | ICD-10-CM

## 2020-04-21 DIAGNOSIS — Z79899 Other long term (current) drug therapy: Secondary | ICD-10-CM | POA: Diagnosis not present

## 2020-04-21 DIAGNOSIS — I480 Paroxysmal atrial fibrillation: Secondary | ICD-10-CM | POA: Diagnosis not present

## 2020-04-21 DIAGNOSIS — Z7901 Long term (current) use of anticoagulants: Secondary | ICD-10-CM | POA: Diagnosis not present

## 2020-04-21 DIAGNOSIS — E785 Hyperlipidemia, unspecified: Secondary | ICD-10-CM | POA: Diagnosis not present

## 2020-04-21 DIAGNOSIS — Z87891 Personal history of nicotine dependence: Secondary | ICD-10-CM | POA: Insufficient documentation

## 2020-04-21 DIAGNOSIS — I129 Hypertensive chronic kidney disease with stage 1 through stage 4 chronic kidney disease, or unspecified chronic kidney disease: Secondary | ICD-10-CM | POA: Diagnosis not present

## 2020-04-21 DIAGNOSIS — Z8673 Personal history of transient ischemic attack (TIA), and cerebral infarction without residual deficits: Secondary | ICD-10-CM | POA: Diagnosis not present

## 2020-04-21 DIAGNOSIS — E039 Hypothyroidism, unspecified: Secondary | ICD-10-CM | POA: Diagnosis not present

## 2020-04-21 DIAGNOSIS — N183 Chronic kidney disease, stage 3 unspecified: Secondary | ICD-10-CM | POA: Diagnosis not present

## 2020-04-21 NOTE — Progress Notes (Signed)
Primary Care Physician: [name], MD Referring Physician: Dr. Florina Ou is a 79 y.o. female with a h/o one year f/u from Dr. Cornelius Moras for long term surveillance of maze procedure and afib. She has had no awareness of afib. No shortness of breath, no pedal edema. She is her with her daughter and has dementia, she is in a long term facility. She is on 2.5 mg eliquis bid. No bleeding issues. CHA2DS2VASc score of at least 4.   Today, she denies symptoms of palpitations, chest pain, shortness of breath, orthopnea, PND, lower extremity edema, dizziness, presyncope, syncope, or neurologic sequela. The patient is tolerating medications without difficulties and is otherwise without complaint today.   Past Medical History:  Diagnosis Date  . Allergic rhinitis   . Anemia   . Asymptomatic LV dysfunction    EF 45-50% echo 07/2017  . CKD (chronic kidney disease), stage III    stage III  . DJD (degenerative joint disease)   . Glomerulonephritis    Dr [name]  . Hyperlipidemia   . Hyperparathyroidism (HCC)   . Hypertension   . Hypothyroidism   . IBS (irritable bowel syndrome)   . Interstitial cystitis   . Mitral regurgitation   . Mobitz II    a. s/p STJ dual chamber PPM   . MVP (mitral valve prolapse)    moderate posterior MVP with moderate MR and grade II diasotlic dysfunction  . PAC (premature atrial contraction)   . PAF (paroxysmal atrial fibrillation) (HCC)     note on pacer check. CHADS2VASC score is 4 now on Eliquis.  [name] PVC's (premature ventricular contractions)   . S/P minimally invasive maze operation for atrial fibrillation 04/16/2017   Complete bilateral atrial lesion set using cryothermy and bipolar radiofrequency ablation with clipping of LA appendage via right mini thoracotomy approach  . S/P minimally invasive mitral valve repair 04/16/2017   Complex valvuloplasty including triangular resection of posterior leaflet, artificial Gore-tex neochords x6 and Sorin  Memo 3D ring annuloplasty ([id number], size 32, serial # [id number])  . S/P placement of cardiac pacemaker   . Small vessel disease, cerebrovascular   . Stroke [facility])    a. old stroke seen on imaging.   Past Surgical History:  Procedure Laterality Date  . ABDOMINAL HYSTERECTOMY    . BREAST BIOPSY Right   . CHEST TUBE INSERTION Right 05/23/2017   Procedure: INSERTION PLEURAL DRAINAGE CATHETER;  Surgeon: Purcell Nails, MD;  Location: [facility] OR;  Service: Thoracic;  Laterality: Right;  possible pleurex catheter  . CLIPPING OF ATRIAL APPENDAGE  04/16/2017   Procedure: CLIPPING OF ATRIAL APPENDAGE using AtriCure Pro2 clip 45;  Surgeon: Purcell Nails, MD;  Location: [facility] OR;  Service: Open Heart Surgery;;  . CYSTOSTOMY W/ BLADDER BIOPSY    . EP IMPLANTABLE DEVICE N/A 01/07/2015   STJ dual chamber pacemaker implanted by Dr [name] for 2:1 heart block  . MINIMALLY INVASIVE MAZE PROCEDURE N/A 04/16/2017   Procedure: MINIMALLY INVASIVE MAZE PROCEDURE;  Surgeon: Purcell Nails, MD;  Location: [facility] OR;  Service: Open Heart Surgery;  Laterality: N/A;  . MITRAL VALVE REPAIR Right 04/16/2017   Procedure: MINIMALLY INVASIVE MITRAL VALVE REPAIR (MVR);  Surgeon: Purcell Nails, MD;  Location: [facility] OR;  Service: Open Heart Surgery;  Laterality: Right;  . MITRAL VALVE REPAIR Right 04/16/2017   Procedure: RE-EXPLORATION RIGHT THORACOTOMY FOR BLEEDING;  Surgeon: Purcell Nails, MD;  Location: [facility] OR;  Service: Open Heart Surgery;  Laterality: Right;  .  PERIPHERAL VASCULAR CATHETERIZATION N/A 02/03/2015   Procedure: Upper Extremity Venography;  Surgeon: [name], MD;  Location: [facility] INVASIVE CV LAB;  Service: Cardiovascular;  Laterality: N/A;  . REMOVAL OF PLEURAL DRAINAGE CATHETER Right 09/03/2017   Procedure: REMOVAL OF PLEURAL DRAINAGE CATHETER;  Surgeon: Purcell Nails, MD;  Location: [facility] OR;  Service: Thoracic;  Laterality: Right;  . RIGHT/LEFT HEART CATH AND CORONARY ANGIOGRAPHY N/A 03/26/2017   Procedure: RIGHT/LEFT  HEART CATH AND CORONARY ANGIOGRAPHY;  Surgeon: [name], MD;  Location: [facility] INVASIVE CV LAB;  Service: Cardiovascular;  Laterality: N/A;  . TEE WITHOUT CARDIOVERSION N/A 03/18/2017   Procedure: TRANSESOPHAGEAL ECHOCARDIOGRAM (TEE);  Surgeon: [name], MD;  Location: [facility] ENDOSCOPY;  Service: Cardiovascular;  Laterality: N/A;  . TEE WITHOUT CARDIOVERSION N/A 04/16/2017   Procedure: TRANSESOPHAGEAL ECHOCARDIOGRAM (TEE);  Surgeon: Purcell Nails, MD;  Location: [facility] OR;  Service: Open Heart Surgery;  Laterality: N/A;  . TONSILLECTOMY      Current Outpatient Medications  Medication Sig Dispense Refill  . acetaminophen (TYLENOL) 500 MG tablet Take 500 mg by mouth as needed for moderate pain.     [name] apixaban (ELIQUIS) 2.5 MG TABS tablet Take 1 tablet (2.5 mg total) by mouth 2 (two) times daily. 60 tablet 6  . calcitRIOL (ROCALTROL) 0.25 MCG capsule Take 0.25 mcg by mouth every other day. In the morning     . escitalopram (LEXAPRO) 10 MG tablet Take 1 tablet (10 mg total) by mouth daily. 30 tablet 5  . levothyroxine (SYNTHROID, LEVOTHROID) 75 MCG tablet Take 75 mcg by mouth daily.    . megestrol (MEGACE) 40 MG tablet Take 1 tablet (40 mg total) by mouth daily. 30 tablet 5  . potassium chloride SA (KLOR-CON) 20 MEQ tablet Take 40 mEq by mouth daily.     No current facility-administered medications for this encounter.    Allergies  Allergen Reactions  . Pepcid [Famotidine] Anaphylaxis, Swelling and Other (See Comments)    Causes "Throat Swelling"   . Allopurinol Other (See Comments)    Caused headaches  . Amlodipine Other (See Comments)    Results in PEDAL EDEMA   . Atorvastatin Other (See Comments)    Causes her leg muscles to ache  . Contrast Media [Iodinated Diagnostic Agents] Hives    IVP dye per patient  . Cephalexin Itching and Rash  . Ciprofloxacin Itching and Rash  . Colchicine Rash  . Erythromycin Itching and Rash  . Prilosec [Omeprazole] Nausea Only  . Sulfonamide  Derivatives Itching and Rash  . Tetracycline Itching and Rash    Social History   Socioeconomic History  . Marital status: Married    Spouse name: Not on file  . Number of children: 2  . Years of education: Not on file  . Highest education level: Associate degree: academic program  Occupational History  . Occupation: retired  Tobacco Use  . Smoking status: Former Games developer  . Smokeless tobacco: Never Used  . Tobacco comment: quit 1965  Vaping Use  . Vaping Use: Never used  Substance and Sexual Activity  . Alcohol use: No  . Drug use: No  . Sexual activity: Not on file  Other Topics Concern  . Not on file  Social History Narrative   04/02/19 lives alone, family asists   Patient drinks 1-2 cups of caffeine daily.   Patient is right handed.    Social Determinants of Health   Financial Resource Strain:   . Difficulty of Paying Living Expenses: Not  on file  Food Insecurity:   . Worried About Programme researcher, broadcasting/film/video in the Last Year: Not on file  . Ran Out of Food in the Last Year: Not on file  Transportation Needs:   . Lack of Transportation (Medical): Not on file  . Lack of Transportation (Non-Medical): Not on file  Physical Activity:   . Days of Exercise per Week: Not on file  . Minutes of Exercise per Session: Not on file  Stress:   . Feeling of Stress : Not on file  Social Connections:   . Frequency of Communication with Friends and Family: Not on file  . Frequency of Social Gatherings with Friends and Family: Not on file  . Attends Religious Services: Not on file  . Active Member of Clubs or Organizations: Not on file  . Attends Banker Meetings: Not on file  . Marital Status: Not on file  Intimate Partner Violence:   . Fear of Current or Ex-Partner: Not on file  . Emotionally Abused: Not on file  . Physically Abused: Not on file  . Sexually Abused: Not on file    Family History  Problem Relation Age of Onset  . Heart disease Mother   . Stroke  Mother   . Hypertension Mother   . Heart disease Father   . Heart attack Father   . Brain cancer Brother   . Heart disease Brother   . Melanoma Brother   . Heart attack Brother   . Hypertension Brother   . Ovarian cancer Sister   . Hypertension Sister   . Rheum arthritis Sister   . Colon cancer Neg Hx     ROS- All systems are reviewed and negative except as per the HPI above  Physical Exam: Vitals:   04/21/20 1022  BP: (!) 150/76  Pulse: 90  Weight: 50.3 kg  Height: 5\' 6"  (1.676 m)   Wt Readings from Last 3 Encounters:  04/21/20 50.3 kg  03/18/20 49.7 kg  02/09/20 49.4 kg    Labs: Lab Results  Component Value Date   NA 141 03/18/2020   K 4.2 03/18/2020   CL 103 03/18/2020   CO2 26 03/18/2020   GLUCOSE 157 (H) 03/18/2020   BUN 35 (H) 03/18/2020   CREATININE 1.40 (H) 03/18/2020   CALCIUM 10.2 03/18/2020   PHOS 2.9 04/23/2017   MG 2.1 12/26/2018   Lab Results  Component Value Date   INR 1.2 05/08/2019   Lab Results  Component Value Date   CHOL 209 (H) 12/27/2018   HDL 57 12/27/2018   LDLCALC 134 (H) 12/27/2018   TRIG 89 12/27/2018     GEN- The patient is well appearing, alert and oriented x 3 today.   Head- normocephalic, atraumatic Eyes-  Sclera clear, conjunctiva pink Ears- hearing intact Oropharynx- clear Neck- supple, no JVP Lymph- no cervical lymphadenopathy Lungs- Clear to ausculation bilaterally, normal work of breathing Heart- Regular rate and rhythm, no murmurs, rubs or gallops, PMI not laterally displaced GI- soft, NT, ND, + BS Extremities- no clubbing, cyanosis, or edema MS- no significant deformity or atrophy Skin- no rash or lesion Psych- euthymic mood, full affect Neuro- strength and sensation are intact  EKG- a sensed/v paced at 90 bpm, pr int 162, qrs int 158 ms qtc 526 ms  Paceart reports reviewed, no afib   Assessment and Plan: 1. Afib  S/p Maze procedure and MV repair with left atrial clipping 04/15/2017  No awareness of  afib Appears to  be maintaining  SR   2. CHA2DS2VASc of at least 6 Eliquis 2.5 mg bid   3. HTN Stable   F/u in one year   Elvina Sidle. Matthew Folks [facility] [address] [place], [place] [address] [phone]

## 2020-04-24 ENCOUNTER — Emergency Department (HOSPITAL_COMMUNITY): Payer: Medicare Other

## 2020-04-24 ENCOUNTER — Emergency Department (HOSPITAL_COMMUNITY)
Admission: EM | Admit: 2020-04-24 | Discharge: 2020-04-24 | Disposition: A | Discharge status: Home / Self Care | Payer: Medicare Other | Attending: Emergency Medicine | Admitting: Emergency Medicine

## 2020-04-24 ENCOUNTER — Encounter (HOSPITAL_COMMUNITY): Payer: Self-pay | Admitting: Emergency Medicine

## 2020-04-24 DIAGNOSIS — Z79890 Hormone replacement therapy (postmenopausal): Secondary | ICD-10-CM | POA: Diagnosis not present

## 2020-04-24 DIAGNOSIS — I129 Hypertensive chronic kidney disease with stage 1 through stage 4 chronic kidney disease, or unspecified chronic kidney disease: Secondary | ICD-10-CM | POA: Diagnosis not present

## 2020-04-24 DIAGNOSIS — Z7901 Long term (current) use of anticoagulants: Secondary | ICD-10-CM | POA: Insufficient documentation

## 2020-04-24 DIAGNOSIS — E039 Hypothyroidism, unspecified: Secondary | ICD-10-CM | POA: Diagnosis not present

## 2020-04-24 DIAGNOSIS — N183 Chronic kidney disease, stage 3 unspecified: Secondary | ICD-10-CM | POA: Diagnosis not present

## 2020-04-24 DIAGNOSIS — S0990XA Unspecified injury of head, initial encounter: Secondary | ICD-10-CM | POA: Diagnosis present

## 2020-04-24 DIAGNOSIS — Z87891 Personal history of nicotine dependence: Secondary | ICD-10-CM | POA: Insufficient documentation

## 2020-04-24 DIAGNOSIS — Z950 Presence of cardiac pacemaker: Secondary | ICD-10-CM | POA: Insufficient documentation

## 2020-04-24 DIAGNOSIS — W19XXXA Unspecified fall, initial encounter: Secondary | ICD-10-CM | POA: Insufficient documentation

## 2020-04-24 DIAGNOSIS — S0003XA Contusion of scalp, initial encounter: Secondary | ICD-10-CM | POA: Insufficient documentation

## 2020-04-24 DIAGNOSIS — F0390 Unspecified dementia without behavioral disturbance: Secondary | ICD-10-CM | POA: Diagnosis not present

## 2020-04-24 LAB — COMPREHENSIVE METABOLIC PANEL
ALT: 13 U/L (ref 0–44)
AST: 18 U/L (ref 15–41)
Albumin: 3.9 g/dL (ref 3.5–5.0)
Alkaline Phosphatase: 53 U/L (ref 38–126)
Anion gap: 8 (ref 5–15)
BUN: 36 mg/dL — ABNORMAL HIGH (ref 8–23)
CO2: 27 mmol/L (ref 22–32)
Calcium: 10.2 mg/dL (ref 8.9–10.3)
Chloride: 106 mmol/L (ref 98–111)
Creatinine, Ser: 1.31 mg/dL — ABNORMAL HIGH (ref 0.44–1.00)
GFR, Estimated: 38 mL/min — ABNORMAL LOW (ref >60)
Glucose, Bld: 99 mg/dL (ref 70–99)
Potassium: 4.2 mmol/L (ref 3.5–5.1)
Sodium: 141 mmol/L (ref 135–145)
Total Bilirubin: 0.7 mg/dL (ref 0.3–1.2)
Total Protein: 6.6 g/dL (ref 6.5–8.1)

## 2020-04-24 LAB — CBC
HCT: 40 % (ref 36.0–46.0)
Hemoglobin: 12.6 g/dL (ref 12.0–15.0)
MCH: 30.7 pg (ref 26.0–34.0)
MCHC: 31.5 g/dL (ref 30.0–36.0)
MCV: 97.3 fL (ref 80.0–100.0)
Platelets: 227 10*3/uL (ref 150–400)
RBC: 4.11 MIL/uL (ref 3.87–5.11)
RDW: 12.3 % (ref 11.5–15.5)
WBC: 8.7 10*3/uL (ref 4.0–10.5)
nRBC: 0 % (ref 0.0–0.2)

## 2020-04-24 LAB — URINALYSIS, ROUTINE W REFLEX MICROSCOPIC
Bilirubin Urine: NEGATIVE
Glucose, UA: NEGATIVE mg/dL
Hgb urine dipstick: NEGATIVE
Ketones, ur: NEGATIVE mg/dL
Leukocytes,Ua: NEGATIVE
Nitrite: NEGATIVE
Protein, ur: NEGATIVE mg/dL
Specific Gravity, Urine: 1.011 (ref 1.005–1.030)
pH: 7 (ref 5.0–8.0)

## 2020-04-24 LAB — I-STAT CHEM 8, ED
BUN: 39 mg/dL — ABNORMAL HIGH (ref 8–23)
Calcium, Ion: 1.31 mmol/L (ref 1.15–1.40)
Chloride: 103 mmol/L (ref 98–111)
Creatinine, Ser: 1.2 mg/dL — ABNORMAL HIGH (ref 0.44–1.00)
Glucose, Bld: 98 mg/dL (ref 70–99)
HCT: 39 % (ref 36.0–46.0)
Hemoglobin: 13.3 g/dL (ref 12.0–15.0)
Potassium: 4.2 mmol/L (ref 3.5–5.1)
Sodium: 143 mmol/L (ref 135–145)
TCO2: 27 mmol/L (ref 22–32)

## 2020-04-24 LAB — SAMPLE TO BLOOD BANK

## 2020-04-24 LAB — PROTIME-INR
INR: 1.2 (ref 0.8–1.2)
Prothrombin Time: 14.3 seconds (ref 11.4–15.2)

## 2020-04-24 NOTE — ED Provider Notes (Signed)
[facility] EMERGENCY DEPARTMENT Provider Note   CSN: [id number] Arrival date & time: 04/24/20  1553     History Chief Complaint  Patient presents with  . Fall    Amanda [name] is a 79 y.o. female. Level 5 caveat secondary to patient's dementia History obtained through nursing history History from EMS History obtained from: Hassel Neth Review of old records HPI 79 year old female history of paroxysmal atrial fibrillation, hypertension, on Eliquis presents today from [place] with unwitnessed fall.  Per nursing report patient had unwitnessed fall, but had been seen just prior to fall and was found on the ground.  She struck her head on carpet that was over concrete.  There is not a known loss of consciousness.  Hematomas noted to the back of her head.  Patient had no other complaints.  Patient is awake and alert however she is not oriented to place or year.  She is not very clear about what occurred.  She has no memory of falling.  She has no current complaints.  Spoke with staff member at [facility] memory care unit.  She states that patient had unwitnessed fall and is not clear how long she was alone or down.  She states that the patient's current mental status is normal for her and she would not normally know the year or be able to recall details of recent events.  Patient has had her Covid vaccine and there are no current outbreaks at [facility].  Past Medical History:  Diagnosis Date  . Allergic rhinitis   . Anemia   . Asymptomatic LV dysfunction    EF 45-50% echo 07/2017  . CKD (chronic kidney disease), stage III (HCC)    stage III  . DJD (degenerative joint disease)   . Glomerulonephritis    Dr [name]  . Hyperlipidemia   . Hyperparathyroidism (HCC)   . Hypertension   . Hypothyroidism   . IBS (irritable bowel syndrome)   . Interstitial cystitis   . Mitral regurgitation   . Mobitz II    a. s/p STJ dual chamber PPM   . MVP  (mitral valve prolapse)    moderate posterior MVP with moderate MR and grade II diasotlic dysfunction  . PAC (premature atrial contraction)   . PAF (paroxysmal atrial fibrillation) (HCC)     note on pacer check. CHADS2VASC score is 4 now on Eliquis.  [name] PVC's (premature ventricular contractions)   . S/P minimally invasive maze operation for atrial fibrillation 04/16/2017   Complete bilateral atrial lesion set using cryothermy and bipolar radiofrequency ablation with clipping of LA appendage via right mini thoracotomy approach  . S/P minimally invasive mitral valve repair 04/16/2017   Complex valvuloplasty including triangular resection of posterior leaflet, artificial Gore-tex neochords x6 and Sorin Memo 3D ring annuloplasty ([id number], size 32, serial # [id number])  . S/P placement of cardiac pacemaker   . Small vessel disease, cerebrovascular   . Stroke [facility])    a. old stroke seen on imaging.    Patient Active Problem List   Diagnosis Date Noted  . Small vessel disease, cerebrovascular   . SIRS (systemic inflammatory response syndrome) (HCC) 05/08/2019  . Acute metabolic encephalopathy 05/08/2019  . Dementia (HCC) 12/27/2018  . PVC's (premature ventricular contractions) 04/16/2018  . Pleural effusion on right 05/22/2017  . Pleural effusion 05/22/2017  . Chronic anticoagulation   . Oral thrush   . Debility 04/26/2017  . History of CVA (cerebrovascular accident)   . S/P placement  of cardiac pacemaker   . Post-operative pain   . Labile blood glucose   . S/P minimally invasive mitral valve repair + maze procedure 04/16/2017  . S/P minimally invasive maze operation for atrial fibrillation 04/16/2017  . Paroxysmal atrial fibrillation (HCC)   . Acute delirium 04/28/2015  . Acute encephalopathy 04/01/2015  . Leukocytosis 04/01/2015  . Hypothyroidism 04/01/2015  . CKD (chronic kidney disease), stage III (HCC) 04/01/2015  . Pacemaker 02/03/2015  . AV block 01/06/2015  . RBBB 05/21/2014  .  Mitral regurgitation 11/26/2013  . PAC (premature atrial contraction)   . DIARRHEA 07/24/2010  . GLOMERULONEPHRITIS 03/27/2010  . COLITIS 10/06/2009  . Hypertension 10/05/2009  . DYSPHAGIA UNSPECIFIED 10/05/2009  . Personal history of other endocrine, metabolic, and immunity disorders 10/05/2009    Past Surgical History:  Procedure Laterality Date  . ABDOMINAL HYSTERECTOMY    . BREAST BIOPSY Right   . CHEST TUBE INSERTION Right 05/23/2017   Procedure: INSERTION PLEURAL DRAINAGE CATHETER;  Surgeon: Purcell Nails, MD;  Location: [facility] OR;  Service: Thoracic;  Laterality: Right;  possible pleurex catheter  . CLIPPING OF ATRIAL APPENDAGE  04/16/2017   Procedure: CLIPPING OF ATRIAL APPENDAGE using AtriCure Pro2 clip 45;  Surgeon: Purcell Nails, MD;  Location: [facility] OR;  Service: Open Heart Surgery;;  . CYSTOSTOMY W/ BLADDER BIOPSY    . EP IMPLANTABLE DEVICE N/A 01/07/2015   STJ dual chamber pacemaker implanted by Dr [name] for 2:1 heart block  . MINIMALLY INVASIVE MAZE PROCEDURE N/A 04/16/2017   Procedure: MINIMALLY INVASIVE MAZE PROCEDURE;  Surgeon: Purcell Nails, MD;  Location: [facility] OR;  Service: Open Heart Surgery;  Laterality: N/A;  . MITRAL VALVE REPAIR Right 04/16/2017   Procedure: MINIMALLY INVASIVE MITRAL VALVE REPAIR (MVR);  Surgeon: Purcell Nails, MD;  Location: [facility] OR;  Service: Open Heart Surgery;  Laterality: Right;  . MITRAL VALVE REPAIR Right 04/16/2017   Procedure: RE-EXPLORATION RIGHT THORACOTOMY FOR BLEEDING;  Surgeon: Purcell Nails, MD;  Location: [facility] OR;  Service: Open Heart Surgery;  Laterality: Right;  . PERIPHERAL VASCULAR CATHETERIZATION N/A 02/03/2015   Procedure: Upper Extremity Venography;  Surgeon: [name], MD;  Location: [facility] INVASIVE CV LAB;  Service: Cardiovascular;  Laterality: N/A;  . REMOVAL OF PLEURAL DRAINAGE CATHETER Right 09/03/2017   Procedure: REMOVAL OF PLEURAL DRAINAGE CATHETER;  Surgeon: Purcell Nails, MD;  Location: [facility] OR;  Service: Thoracic;   Laterality: Right;  . RIGHT/LEFT HEART CATH AND CORONARY ANGIOGRAPHY N/A 03/26/2017   Procedure: RIGHT/LEFT HEART CATH AND CORONARY ANGIOGRAPHY;  Surgeon: [name], MD;  Location: [facility] INVASIVE CV LAB;  Service: Cardiovascular;  Laterality: N/A;  . TEE WITHOUT CARDIOVERSION N/A 03/18/2017   Procedure: TRANSESOPHAGEAL ECHOCARDIOGRAM (TEE);  Surgeon: [name], MD;  Location: [facility] ENDOSCOPY;  Service: Cardiovascular;  Laterality: N/A;  . TEE WITHOUT CARDIOVERSION N/A 04/16/2017   Procedure: TRANSESOPHAGEAL ECHOCARDIOGRAM (TEE);  Surgeon: Purcell Nails, MD;  Location: [facility] OR;  Service: Open Heart Surgery;  Laterality: N/A;  . TONSILLECTOMY       OB History   No obstetric history on file.     Family History  Problem Relation Age of Onset  . Heart disease Mother   . Stroke Mother   . Hypertension Mother   . Heart disease Father   . Heart attack Father   . Brain cancer Brother   . Heart disease Brother   . Melanoma Brother   . Heart attack Brother   . Hypertension Brother   .  Ovarian cancer Sister   . Hypertension Sister   . Rheum arthritis Sister   . Colon cancer Neg Hx     Social History   Tobacco Use  . Smoking status: Former Games developer  . Smokeless tobacco: Never Used  . Tobacco comment: quit 1965  Vaping Use  . Vaping Use: Never used  Substance Use Topics  . Alcohol use: No  . Drug use: No    Home Medications Prior to Admission medications   Medication Sig Start Date End Date Taking? Authorizing Provider  acetaminophen (TYLENOL) 500 MG tablet Take 500 mg by mouth as needed for moderate pain.     [provider]  apixaban (ELIQUIS) 2.5 MG TABS tablet Take 1 tablet (2.5 mg total) by mouth 2 (two) times daily. 04/22/19   Newman Nip, NP  calcitRIOL (ROCALTROL) 0.25 MCG capsule Take 0.25 mcg by mouth every other day. In the morning     [provider]  escitalopram (LEXAPRO) 10 MG tablet Take 1 tablet (10 mg total) by mouth daily. 02/09/20    Glean Salvo, NP  levothyroxine (SYNTHROID, LEVOTHROID) 75 MCG tablet Take 75 mcg by mouth daily.    [provider]  megestrol (MEGACE) 40 MG tablet Take 1 tablet (40 mg total) by mouth daily. 02/09/20   Glean Salvo, NP  potassium chloride SA (KLOR-CON) 20 MEQ tablet Take 40 mEq by mouth daily. 08/17/19   [provider]    Allergies    Pepcid [famotidine], Allopurinol, Amlodipine, Atorvastatin, Contrast media [iodinated diagnostic agents], Cephalexin, Ciprofloxacin, Colchicine, Erythromycin, Prilosec [omeprazole], Sulfonamide derivatives, and Tetracycline  Review of Systems   Review of Systems  Unable to perform ROS: Dementia    Physical Exam Updated Vital Signs BP (!) 185/83   Pulse 94   SpO2 (!) 88%   Physical Exam Vitals and nursing note reviewed.  Constitutional:      General: She is not in acute distress.    Appearance: Normal appearance. She is normal weight. She is not ill-appearing.  HENT:     Head: Normocephalic.     Comments: Hematoma left occiput no laceration noted    Right Ear: External ear normal.     Left Ear: External ear normal.     Nose: Nose normal.     Mouth/Throat:     Mouth: Mucous membranes are moist.  Eyes:     Extraocular Movements: Extraocular movements intact.  Neck:     Comments: Cervical collar in place Cardiovascular:     Rate and Rhythm: Normal rate and regular rhythm.  Pulmonary:     Effort: Pulmonary effort is normal.     Breath sounds: Normal breath sounds.  Abdominal:     General: Abdomen is flat.     Palpations: Abdomen is soft.  Musculoskeletal:        General: Normal range of motion.  Skin:    General: Skin is warm and dry.     Capillary Refill: Capillary refill takes less than 2 seconds.  Neurological:     General: No focal deficit present.     Mental Status: She is alert.     Motor: No weakness.     Coordination: Coordination normal.     Deep Tendon Reflexes: Reflexes normal.     Comments: Patient  oriented to person but not to place or year  Psychiatric:        Mood and Affect: Mood normal.     ED Results / Procedures / Treatments  Labs (all labs ordered are listed, but only abnormal results are displayed) Labs Reviewed  COMPREHENSIVE METABOLIC PANEL  CBC  ETHANOL  URINALYSIS, ROUTINE W REFLEX MICROSCOPIC  PROTIME-INR  I-STAT CHEM 8, ED  SAMPLE TO BLOOD BANK    EKG None  Radiology No results found.  Procedures Procedures (including critical care time)  Medications Ordered in ED Medications - No data to display  ED Course  I have reviewed the triage vital signs and the nursing notes.  Pertinent labs & imaging results that were available during my care of the patient were reviewed by me and considered in my medical decision making (see chart for details).  Clinical Course as of Apr 24 1830  Wynelle Link Apr 24, 2020  1829 Creatinine(!): 1.20 [DR]  1830 Ct report and images reviewed    [DR]  1830 ED EKG [DR]  1831 EKG reviewed and interpreted by me   [DR]    Clinical Course User Index [DR] Margarita Grizzle, MD   MDM Rules/Calculators/A&P                          79 year old female history of dementia and dementia care unit patient had an unwitnessed fall today.  She has a hematoma to her head.  Patient was evaluated here with CT of her head neck does not show any acute intracranial or cervical spine injury.  Otherwise work-up is unremarkable and appears to be at baseline.  Patient appears to be stable for discharge back to facility.  Final Clinical Impression(s) / ED Diagnoses Final diagnoses:  Fall  Contusion of scalp, initial encounter    Rx / DC Orders ED Discharge Orders    None       Margarita Grizzle, MD 04/24/20 [phone]

## 2020-04-24 NOTE — ED Notes (Signed)
Patient transported to CT 

## 2020-04-24 NOTE — Discharge Instructions (Addendum)
Patient evaluated here with head CT and neck CT.  There does not appear to be any acute intracranial injury.  Patient appears to be stable for return to facility. Please have patient return to hospital if there is decreased mental status, lateralized weakness, or she appears worse in any way.

## 2020-04-24 NOTE — ED Notes (Signed)
Radiology at bedside

## 2020-04-24 NOTE — ED Triage Notes (Signed)
Pt BIB [facility] from [facility]. Pt experienced an unwitnessed fall onto carpet over concrete. Unknown loss of consciousness. GCS 14 and at baseline. Pt on eliquis. VSS.

## 2020-05-16 ENCOUNTER — Ambulatory Visit (INDEPENDENT_AMBULATORY_CARE_PROVIDER_SITE_OTHER): Payer: Medicare Other

## 2020-05-16 DIAGNOSIS — R001 Bradycardia, unspecified: Secondary | ICD-10-CM | POA: Diagnosis not present

## 2020-05-17 LAB — CUP PACEART REMOTE DEVICE CHECK
Battery Remaining Longevity: 116 mo
Battery Remaining Percentage: 95.5 %
Battery Voltage: 2.98 V
Brady Statistic AP VP Percent: 3.2 %
Brady Statistic AP VS Percent: 1 %
Brady Statistic AS VP Percent: 95 %
Brady Statistic AS VS Percent: 1 %
Brady Statistic RA Percent Paced: 2.6 %
Brady Statistic RV Percent Paced: 99 %
Date Time Interrogation Session: 20211109040018
Implantable Lead Implant Date: 20160701
Implantable Lead Implant Date: 20160701
Implantable Lead Location: 753859
Implantable Lead Location: 753860
Implantable Pulse Generator Implant Date: 20160701
Lead Channel Impedance Value: 400 Ohm
Lead Channel Impedance Value: 530 Ohm
Lead Channel Pacing Threshold Amplitude: 0.75 V
Lead Channel Pacing Threshold Amplitude: 1 V
Lead Channel Pacing Threshold Pulse Width: 0.4 ms
Lead Channel Pacing Threshold Pulse Width: 0.4 ms
Lead Channel Sensing Intrinsic Amplitude: 12 mV
Lead Channel Sensing Intrinsic Amplitude: 4.7 mV
Lead Channel Setting Pacing Amplitude: 1.25 V
Lead Channel Setting Pacing Amplitude: 2 V
Lead Channel Setting Pacing Pulse Width: 0.4 ms
Lead Channel Setting Sensing Sensitivity: 2 mV
Pulse Gen Model: 2240
Pulse Gen Serial Number: 7785935

## 2020-05-18 NOTE — Progress Notes (Signed)
Remote pacemaker transmission.   

## 2020-06-13 NOTE — Progress Notes (Signed)
Cardiology Office Note Date:  06/13/2020  Patient ID:  Laurence, Folz [birth year], MRN [id number] PCP:  [name], MD  Cardiologist:  [name] Electrophysiologist: Dr. [name]     Chief Complaint:   History of Present Illness: LIAHNA BRICKNER is a 79 y.o. female with history of CVA (right parietal stroke and extensive small vessel disease), progressive dementia, Afib, HTN, VHD (s/p minimally invasive mitral valve repair, LAA clipping, and maze procedure 04/16/2017), PACs, PVCs, CKD (III), DCM, advanced heart block w/PPM  She comes in today to be seen for Dr. [name], last seen by him Aug 2019   She last saw neurology Aug 2021, discussed October 2020 with a fever up to 103 and a white count of 14.8. She was treated with antibiotics for presumed sepsis although the source of this was never determined.  Noted she continues to lose weight as she has a poor appetite, she has lost 5 pounds since last visit. She is having some problems with feeling fidgety throughout the day, she may have some visual hallucinations at times but this does not result in severe agitation.  No changes were made.  She saw [name] Sept 2021, doing well, though with some weight loss, planned for labs, echo.  She had an AFib clinic visit Oct 2021, no changes were made.  She had an ER visit after an unwitnessed  fall, found at her baseline MS, CT of her head neck does not show any acute intracranial or cervical spine injury, discharged from the ER.  TODAY She is accompanied by her daughter She resides at at [facility]  The patient has had avancing dementia and her daughter provides her PMHx and HPI. She has not noticed or been made aware of any kind of symptomatology Has not observed any SOB, her Mom has not c/o to her of any CP, palpitations There is no syncope, but has had falls, these have been suspect to be weak spells, legs give out, while they are not certain, syncope is not  suspected. She is Eliquis, no reported or noted bleeding/signs of bleeding The patient tells me she is feeling fine, denies any symptoms. Her nephrologist retired and is now Dr. Malen Gauze BP is high here, her daughter states that her BPs are always higher at the doctor's office and are much better generally    Device information SJM dual chamber PPM implanted 01/07/2015   Past Medical History:  Diagnosis Date   Allergic rhinitis    Anemia    Asymptomatic LV dysfunction    EF 45-50% echo 07/2017   CKD (chronic kidney disease), stage III (HCC)    stage III   DJD (degenerative joint disease)    Glomerulonephritis    Dr [name]   Hyperlipidemia    Hyperparathyroidism (HCC)    Hypertension    Hypothyroidism    IBS (irritable bowel syndrome)    Interstitial cystitis    Mitral regurgitation    Mobitz II    a. s/p STJ dual chamber PPM    MVP (mitral valve prolapse)    moderate posterior MVP with moderate MR and grade II diasotlic dysfunction   PAC (premature atrial contraction)    PAF (paroxysmal atrial fibrillation) (HCC)     note on pacer check. CHADS2VASC score is 4 now on Eliquis.   PVC's (premature ventricular contractions)    S/P minimally invasive maze operation for atrial fibrillation 04/16/2017   Complete bilateral atrial lesion set using cryothermy and bipolar radiofrequency ablation with  clipping of LA appendage via right mini thoracotomy approach   S/P minimally invasive mitral valve repair 04/16/2017   Complex valvuloplasty including triangular resection of posterior leaflet, artificial Gore-tex neochords x6 and Sorin Memo 3D ring annuloplasty ([id number], size 32, serial # [id number])   S/P placement of cardiac pacemaker    Small vessel disease, cerebrovascular    Stroke [facility])    a. old stroke seen on imaging.    Past Surgical History:  Procedure Laterality Date   ABDOMINAL HYSTERECTOMY     BREAST BIOPSY Right    CHEST TUBE INSERTION Right  05/23/2017   Procedure: INSERTION PLEURAL DRAINAGE CATHETER;  Surgeon: Purcell Nails, MD;  Location: [facility] OR;  Service: Thoracic;  Laterality: Right;  possible pleurex catheter   CLIPPING OF ATRIAL APPENDAGE  04/16/2017   Procedure: CLIPPING OF ATRIAL APPENDAGE using AtriCure Pro2 clip 45;  Surgeon: Purcell Nails, MD;  Location: [facility] OR;  Service: Open Heart Surgery;;   CYSTOSTOMY W/ BLADDER BIOPSY     EP IMPLANTABLE DEVICE N/A 01/07/2015   STJ dual chamber pacemaker implanted by Dr [name] for 2:1 heart block   MINIMALLY INVASIVE MAZE PROCEDURE N/A 04/16/2017   Procedure: MINIMALLY INVASIVE MAZE PROCEDURE;  Surgeon: Purcell Nails, MD;  Location: [facility] OR;  Service: Open Heart Surgery;  Laterality: N/A;   MITRAL VALVE REPAIR Right 04/16/2017   Procedure: MINIMALLY INVASIVE MITRAL VALVE REPAIR (MVR);  Surgeon: Purcell Nails, MD;  Location: [facility] OR;  Service: Open Heart Surgery;  Laterality: Right;   MITRAL VALVE REPAIR Right 04/16/2017   Procedure: RE-EXPLORATION RIGHT THORACOTOMY FOR BLEEDING;  Surgeon: Purcell Nails, MD;  Location: [facility] OR;  Service: Open Heart Surgery;  Laterality: Right;   PERIPHERAL VASCULAR CATHETERIZATION N/A 02/03/2015   Procedure: Upper Extremity Venography;  Surgeon: [name], MD;  Location: [facility] INVASIVE CV LAB;  Service: Cardiovascular;  Laterality: N/A;   REMOVAL OF PLEURAL DRAINAGE CATHETER Right 09/03/2017   Procedure: REMOVAL OF PLEURAL DRAINAGE CATHETER;  Surgeon: Purcell Nails, MD;  Location: [facility] OR;  Service: Thoracic;  Laterality: Right;   RIGHT/LEFT HEART CATH AND CORONARY ANGIOGRAPHY N/A 03/26/2017   Procedure: RIGHT/LEFT HEART CATH AND CORONARY ANGIOGRAPHY;  Surgeon: [name], MD;  Location: [facility] INVASIVE CV LAB;  Service: Cardiovascular;  Laterality: N/A;   TEE WITHOUT CARDIOVERSION N/A 03/18/2017   Procedure: TRANSESOPHAGEAL ECHOCARDIOGRAM (TEE);  Surgeon: [name], MD;  Location: [facility] ENDOSCOPY;  Service: Cardiovascular;  Laterality:  N/A;   TEE WITHOUT CARDIOVERSION N/A 04/16/2017   Procedure: TRANSESOPHAGEAL ECHOCARDIOGRAM (TEE);  Surgeon: Purcell Nails, MD;  Location: [facility] OR;  Service: Open Heart Surgery;  Laterality: N/A;   TONSILLECTOMY      Current Outpatient Medications  Medication Sig Dispense Refill   acetaminophen (TYLENOL) 500 MG tablet Take 500 mg by mouth as needed for moderate pain.      apixaban (ELIQUIS) 2.5 MG TABS tablet Take 1 tablet (2.5 mg total) by mouth 2 (two) times daily. 60 tablet 6   calcitRIOL (ROCALTROL) 0.25 MCG capsule Take 0.25 mcg by mouth every other day. In the morning      escitalopram (LEXAPRO) 10 MG tablet Take 1 tablet (10 mg total) by mouth daily. 30 tablet 5   levothyroxine (SYNTHROID, LEVOTHROID) 75 MCG tablet Take 75 mcg by mouth daily.     megestrol (MEGACE) 40 MG tablet Take 1 tablet (40 mg total) by mouth daily. 30 tablet 5   potassium chloride SA (KLOR-CON) 20 MEQ tablet Take 40 mEq  by mouth daily.     No current facility-administered medications for this visit.    Allergies:   Pepcid [famotidine], Allopurinol, Amlodipine, Atorvastatin, Contrast media [iodinated diagnostic agents], Cephalexin, Ciprofloxacin, Colchicine, Erythromycin, Prilosec [omeprazole], Sulfonamide derivatives, and Tetracycline   Social History:  The patient  reports that she has quit smoking. She has never used smokeless tobacco. She reports that she does not drink alcohol and does not use drugs.   Family History:  The patient's family history includes Brain cancer in her brother; Heart attack in her brother and father; Heart disease in her brother, father, and mother; Hypertension in her brother, mother, and sister; Melanoma in her brother; Ovarian cancer in her sister; Rheum arthritis in her sister; Stroke in her mother.  ROS:  Please see the history of present illness.    All other systems are reviewed and otherwise negative.   PHYSICAL EXAM:  VS:  There were no vitals taken for this  visit. BMI: There is no height or weight on file to calculate BMI. Well nourished, well developed, thin, elderly female in no acute distress HEENT: normocephalic, atraumatic Neck: no JVD, carotid bruits or masses Cardiac:  RRR; no significant murmurs, no rubs, or gallops Lungs:  CTA b/l, no wheezing, rhonchi or rales Abd: soft, nontender MS: no deformity, age appropriate atrophy Ext: no edema Skin: warm and dry, no rash Neuro:  No gross deficits appreciated Psych: euthymic mood, full affect  PPM site is stable, no tethering or discomfort   EKG:  Not done today  Device interrogation done today and reviewed by myself:  battery and lead measurements are good No R waves at 40 today AMS episodes noted All available EGMs are reviewed, these are AT, are brief and Vpaced Vast majority are <91minute, longest was Burden is <1% cybersecurity upgrade/update was completed   12/27/2018: TTE IMPRESSIONS  1. Moderate akinesis of the left ventricular, mid-apical anteroseptal  wall and apical segment.  2. The left ventricle has mildly reduced systolic function, with an  ejection fraction of 45-50%. The cavity size was normal. Left ventricular  diastolic Doppler parameters are indeterminate.  3. The right ventricle has normal systolic function. The cavity was  normal. There is no increase in right ventricular wall thickness.  4. Mitral valve repair noted with annuloplasty ring.  5. The aortic valve is tricuspid.  6. The inferior vena cava was normal in size with <50% respiratory  variability.  7. No intracardiac thrombi or masses were visualized.     03/26/2017; R/LHC  The left ventricular systolic function is normal.  Essentially normal right heart cath numbers with mildly elevated LVEDP and PCWP. Please tell if he has a very large V wave however consistent with severe MR.  The left ventricular ejection fraction is 55-65% by visual estimate.  There is moderate-severe (4+)  mitral regurgitation.  Angiographically normal coronary arteries, but significant tortuosity   Angiographic normal coronary arteries with moderate-severe mitral regurgitation. Large V wave on PCWP.    Recent Labs: 03/18/2020: TSH 0.871 04/24/2020: ALT 13; BUN 39; Creatinine, Ser 1.20; Hemoglobin 13.3; Platelets 227; Potassium 4.2; Sodium 143  No results found for requested labs within last 8760 hours.   CrCl cannot be calculated (Patient's most recent lab result is older than the maximum 21 days allowed.).   Wt Readings from Last 3 Encounters:  04/21/20 110 lb 12.8 oz (50.3 kg)  03/18/20 109 lb 9.6 oz (49.7 kg)  02/09/20 109 lb (49.4 kg)     Other studies  reviewed: Additional studies/records reviewed today include: summarized above  ASSESSMENT AND PLAN:  1. PPM     Intact function, no programming changes were mae  2. Paroxysmal Afib     H/o MAZE, LAA clipping     CHA2DS2Vasc is 7, on Eliquis, appropriately dosed for age/weight     <1% burden     All EGMs available for review today were AT  3. NICM     No reported symptoms, no exam findings to suggest volume OL      Echo last year was 45-50%     C/w [name]  4. HTN     No meds     Reported better at home/SNF  5. VHD     S/p MV repair     Echo last year noted, only trivial MR    Disposition: F/u with remotes Q 32mo, and in clinic with EP in 1 year, sooner if needed  Current medicines are reviewed at length with the patient today.  The patient did not have any concerns regarding medicines.  Norma Fredrickson, PA-C 06/13/2020 9:02 AM     [facility] [address] [place] [address] [phone] (office)  [phone] (fax)

## 2020-06-15 ENCOUNTER — Ambulatory Visit: Payer: Medicare Other | Admitting: Physician Assistant

## 2020-06-15 ENCOUNTER — Encounter: Payer: Self-pay | Admitting: Physician Assistant

## 2020-06-15 ENCOUNTER — Other Ambulatory Visit: Payer: Self-pay

## 2020-06-15 VITALS — BP 154/94 | HR 56 | Ht 66.0 in | Wt 112.2 lb

## 2020-06-15 DIAGNOSIS — I4430 Unspecified atrioventricular block: Secondary | ICD-10-CM

## 2020-06-15 DIAGNOSIS — Z9889 Other specified postprocedural states: Secondary | ICD-10-CM

## 2020-06-15 DIAGNOSIS — Z950 Presence of cardiac pacemaker: Secondary | ICD-10-CM

## 2020-06-15 DIAGNOSIS — I428 Other cardiomyopathies: Secondary | ICD-10-CM | POA: Diagnosis not present

## 2020-06-15 DIAGNOSIS — I10 Essential (primary) hypertension: Secondary | ICD-10-CM

## 2020-06-15 NOTE — Patient Instructions (Signed)

## 2020-07-12 DIAGNOSIS — Z1159 Encounter for screening for other viral diseases: Secondary | ICD-10-CM | POA: Diagnosis not present

## 2020-07-15 DIAGNOSIS — Z1159 Encounter for screening for other viral diseases: Secondary | ICD-10-CM | POA: Diagnosis not present

## 2020-07-18 ENCOUNTER — Telehealth: Payer: Self-pay

## 2020-07-18 NOTE — Telephone Encounter (Signed)
1 HVR events appears true NSVT, 24 beats at 169bpm.  Also AMS events noted, longest duration 23.7minutes.  Known PAF.  Meds: Eliquis  Spoke with pt daughter [name], She reports pt is currently recovering from COVID infection diagnosed about 2 weeks ago.  Pt has not had any cardiac complaints.    Advised daughter, will forward to MD for review, anticipate continued monitoring.

## 2020-07-20 DIAGNOSIS — Z79899 Other long term (current) drug therapy: Secondary | ICD-10-CM | POA: Diagnosis not present

## 2020-07-20 DIAGNOSIS — W19XXXA Unspecified fall, initial encounter: Secondary | ICD-10-CM | POA: Diagnosis not present

## 2020-07-20 DIAGNOSIS — I10 Essential (primary) hypertension: Secondary | ICD-10-CM | POA: Diagnosis not present

## 2020-07-20 DIAGNOSIS — R195 Other fecal abnormalities: Secondary | ICD-10-CM | POA: Diagnosis not present

## 2020-07-20 NOTE — Telephone Encounter (Signed)
No change. Continue Eliquis.

## 2020-07-20 NOTE — Telephone Encounter (Signed)
No change 

## 2020-08-03 DIAGNOSIS — W19XXXA Unspecified fall, initial encounter: Secondary | ICD-10-CM | POA: Diagnosis not present

## 2020-08-03 DIAGNOSIS — E039 Hypothyroidism, unspecified: Secondary | ICD-10-CM | POA: Diagnosis not present

## 2020-08-03 DIAGNOSIS — I4891 Unspecified atrial fibrillation: Secondary | ICD-10-CM | POA: Diagnosis not present

## 2020-08-03 DIAGNOSIS — R2681 Unsteadiness on feet: Secondary | ICD-10-CM | POA: Diagnosis not present

## 2020-08-10 DIAGNOSIS — E876 Hypokalemia: Secondary | ICD-10-CM | POA: Diagnosis not present

## 2020-08-10 DIAGNOSIS — E212 Other hyperparathyroidism: Secondary | ICD-10-CM | POA: Diagnosis not present

## 2020-08-11 ENCOUNTER — Ambulatory Visit: Payer: Medicare Other | Admitting: Neurology

## 2020-08-11 DIAGNOSIS — Z79899 Other long term (current) drug therapy: Secondary | ICD-10-CM | POA: Diagnosis not present

## 2020-08-15 ENCOUNTER — Ambulatory Visit (INDEPENDENT_AMBULATORY_CARE_PROVIDER_SITE_OTHER): Payer: Medicare Other

## 2020-08-15 DIAGNOSIS — I4430 Unspecified atrioventricular block: Secondary | ICD-10-CM

## 2020-08-17 DIAGNOSIS — N1831 Chronic kidney disease, stage 3a: Secondary | ICD-10-CM | POA: Diagnosis not present

## 2020-08-17 DIAGNOSIS — D7589 Other specified diseases of blood and blood-forming organs: Secondary | ICD-10-CM | POA: Diagnosis not present

## 2020-08-17 DIAGNOSIS — D72828 Other elevated white blood cell count: Secondary | ICD-10-CM | POA: Diagnosis not present

## 2020-08-17 DIAGNOSIS — E212 Other hyperparathyroidism: Secondary | ICD-10-CM | POA: Diagnosis not present

## 2020-08-17 DIAGNOSIS — I129 Hypertensive chronic kidney disease with stage 1 through stage 4 chronic kidney disease, or unspecified chronic kidney disease: Secondary | ICD-10-CM | POA: Diagnosis not present

## 2020-08-17 DIAGNOSIS — D72829 Elevated white blood cell count, unspecified: Secondary | ICD-10-CM | POA: Diagnosis not present

## 2020-08-17 LAB — CUP PACEART REMOTE DEVICE CHECK
Battery Remaining Longevity: 114 mo
Battery Remaining Percentage: 95.5 %
Battery Voltage: 2.98 V
Brady Statistic AP VP Percent: 1.2 %
Brady Statistic AP VS Percent: 1 %
Brady Statistic AS VP Percent: 98 %
Brady Statistic AS VS Percent: 1 %
Brady Statistic RA Percent Paced: 1.2 %
Brady Statistic RV Percent Paced: 99 %
Date Time Interrogation Session: 20220208141520
Implantable Lead Implant Date: 20160701
Implantable Lead Implant Date: 20160701
Implantable Lead Location: 753859
Implantable Lead Location: 753860
Implantable Pulse Generator Implant Date: 20160701
Lead Channel Impedance Value: 350 Ohm
Lead Channel Impedance Value: 490 Ohm
Lead Channel Pacing Threshold Amplitude: 0.75 V
Lead Channel Pacing Threshold Amplitude: 1 V
Lead Channel Pacing Threshold Pulse Width: 0.4 ms
Lead Channel Pacing Threshold Pulse Width: 0.4 ms
Lead Channel Sensing Intrinsic Amplitude: 12 mV
Lead Channel Sensing Intrinsic Amplitude: 4 mV
Lead Channel Setting Pacing Amplitude: 1.25 V
Lead Channel Setting Pacing Amplitude: 2 V
Lead Channel Setting Pacing Pulse Width: 0.4 ms
Lead Channel Setting Sensing Sensitivity: 2 mV
Pulse Gen Model: 2240
Pulse Gen Serial Number: 7785935

## 2020-08-19 NOTE — Progress Notes (Signed)
Remote pacemaker transmission.   

## 2020-08-25 DIAGNOSIS — E212 Other hyperparathyroidism: Secondary | ICD-10-CM | POA: Diagnosis not present

## 2020-08-31 DIAGNOSIS — D72829 Elevated white blood cell count, unspecified: Secondary | ICD-10-CM | POA: Diagnosis not present

## 2020-08-31 DIAGNOSIS — E212 Other hyperparathyroidism: Secondary | ICD-10-CM | POA: Diagnosis not present

## 2020-08-31 DIAGNOSIS — R2681 Unsteadiness on feet: Secondary | ICD-10-CM | POA: Diagnosis not present

## 2020-09-05 DIAGNOSIS — M6281 Muscle weakness (generalized): Secondary | ICD-10-CM | POA: Diagnosis not present

## 2020-09-05 DIAGNOSIS — R262 Difficulty in walking, not elsewhere classified: Secondary | ICD-10-CM | POA: Diagnosis not present

## 2020-09-06 DIAGNOSIS — R262 Difficulty in walking, not elsewhere classified: Secondary | ICD-10-CM | POA: Diagnosis not present

## 2020-09-06 DIAGNOSIS — M6281 Muscle weakness (generalized): Secondary | ICD-10-CM | POA: Diagnosis not present

## 2020-09-07 ENCOUNTER — Encounter: Payer: Self-pay | Admitting: Cardiology

## 2020-09-07 ENCOUNTER — Telehealth (INDEPENDENT_AMBULATORY_CARE_PROVIDER_SITE_OTHER): Payer: Medicare Other | Admitting: Cardiology

## 2020-09-07 ENCOUNTER — Other Ambulatory Visit: Payer: Self-pay

## 2020-09-07 VITALS — HR 94 | Ht 66.0 in | Wt 110.0 lb

## 2020-09-07 DIAGNOSIS — N1831 Chronic kidney disease, stage 3a: Secondary | ICD-10-CM | POA: Diagnosis not present

## 2020-09-07 DIAGNOSIS — I340 Nonrheumatic mitral (valve) insufficiency: Secondary | ICD-10-CM | POA: Diagnosis not present

## 2020-09-07 DIAGNOSIS — I420 Dilated cardiomyopathy: Secondary | ICD-10-CM

## 2020-09-07 DIAGNOSIS — I4430 Unspecified atrioventricular block: Secondary | ICD-10-CM | POA: Diagnosis not present

## 2020-09-07 DIAGNOSIS — I480 Paroxysmal atrial fibrillation: Secondary | ICD-10-CM

## 2020-09-07 DIAGNOSIS — I10 Essential (primary) hypertension: Secondary | ICD-10-CM | POA: Diagnosis not present

## 2020-09-07 DIAGNOSIS — E039 Hypothyroidism, unspecified: Secondary | ICD-10-CM | POA: Diagnosis not present

## 2020-09-07 DIAGNOSIS — W19XXXA Unspecified fall, initial encounter: Secondary | ICD-10-CM | POA: Diagnosis not present

## 2020-09-07 DIAGNOSIS — I4891 Unspecified atrial fibrillation: Secondary | ICD-10-CM | POA: Diagnosis not present

## 2020-09-07 NOTE — Addendum Note (Signed)
Addended by: Theresia Majors on: 09/07/2020 02:17 PM   Modules accepted: Orders

## 2020-09-07 NOTE — Progress Notes (Signed)
Virtual Visit via Video Note   This visit type was conducted due to national recommendations for restrictions regarding the COVID-19 Pandemic (e.g. social distancing) in an effort to limit this patient's exposure and mitigate transmission in our community.  Due to her co-morbid illnesses, this patient is at least at moderate risk for complications without adequate follow up.  This format is felt to be most appropriate for this patient at this time.  All issues noted in this document were discussed and addressed.  A limited physical exam was performed with this format.  Please refer to the patient's chart for her consent to telehealth for [facility].  Date:  09/07/2020   ID:  Amanda [name], DOB 1941-01-25, MRN [id number] The patient was identified using 2 identifiers.  Patient Location: Home Provider Location: Work Office  PCP:  [name], MD   [facility]  Cardiologist:  Armanda Magic, MD  Advanced Practice Provider:  No care team member to display Electrophysiologist:  Lewayne Bunting, MD  Electrophysiology  :[phone]   Evaluation Performed:  Follow-Up Visit  Chief Complaint:  MR, Atrial fibrillation, HTN,DCM  History of Present Illness:    Amanda [name] is a 80 y.o. female with a hx of severe MRs/pminimally invasive mitral valve repair and maze procedure 04/16/2017 for PAF. She had a complicated course requiring inpatient rehab and right thoracentesis. She then underwent right Pleurx catheter for recurrence. Also has history of PACs, PVCs,  Hypertension and CKD followed by nephrology.CHA2DS2-VASc 4on NOAC.Her last echo 12/2018 showed mild LV dysfunction with EF 45-50% with mild apical anteroseptal and apical akinesis and stable MV repair with annuloplasty ring similar to prior echoes.   She is here today for followup and is doing well.  She is with her daughter today at [facility] where she is residing.  She denies any chest pain or  pressure, SOB, DOE, PND, orthopnea, LE edema, dizziness, palpitations or syncope. She is compliant with her meds and is tolerating meds with no SE.    The patient does not have symptoms concerning for COVID-19 infection (fever, chills, cough, or new shortness of breath).    Past Medical History:  Diagnosis Date  . Allergic rhinitis   . Anemia   . Asymptomatic LV dysfunction    EF 45-50% echo 07/2017  . CKD (chronic kidney disease), stage III (HCC)    stage III  . DJD (degenerative joint disease)   . Glomerulonephritis    Dr [name]  . Hyperlipidemia   . Hyperparathyroidism (HCC)   . Hypertension   . Hypothyroidism   . IBS (irritable bowel syndrome)   . Interstitial cystitis   . Mitral regurgitation   . Mobitz II    a. s/p STJ dual chamber PPM   . MVP (mitral valve prolapse)    moderate posterior MVP with moderate MR and grade II diasotlic dysfunction  . PAC (premature atrial contraction)   . PAF (paroxysmal atrial fibrillation) (HCC)     note on pacer check. CHADS2VASC score is 4 now on Eliquis.  [name] PVC's (premature ventricular contractions)   . S/P minimally invasive maze operation for atrial fibrillation 04/16/2017   Complete bilateral atrial lesion set using cryothermy and bipolar radiofrequency ablation with clipping of LA appendage via right mini thoracotomy approach  . S/P minimally invasive mitral valve repair 04/16/2017   Complex valvuloplasty including triangular resection of posterior leaflet, artificial Gore-tex neochords x6 and Sorin Memo 3D ring annuloplasty ([id number], size 32, serial # [id number])  .  S/P placement of cardiac pacemaker   . Small vessel disease, cerebrovascular   . Stroke [facility])    a. old stroke seen on imaging.   Past Surgical History:  Procedure Laterality Date  . ABDOMINAL HYSTERECTOMY    . BREAST BIOPSY Right   . CHEST TUBE INSERTION Right 05/23/2017   Procedure: INSERTION PLEURAL DRAINAGE CATHETER;  Surgeon: Purcell Nails, MD;  Location: [facility] OR;   Service: Thoracic;  Laterality: Right;  possible pleurex catheter  . CLIPPING OF ATRIAL APPENDAGE  04/16/2017   Procedure: CLIPPING OF ATRIAL APPENDAGE using AtriCure Pro2 clip 45;  Surgeon: Purcell Nails, MD;  Location: [facility] OR;  Service: Open Heart Surgery;;  . CYSTOSTOMY W/ BLADDER BIOPSY    . EP IMPLANTABLE DEVICE N/A 01/07/2015   STJ dual chamber pacemaker implanted by Dr [name] for 2:1 heart block  . MINIMALLY INVASIVE MAZE PROCEDURE N/A 04/16/2017   Procedure: MINIMALLY INVASIVE MAZE PROCEDURE;  Surgeon: Purcell Nails, MD;  Location[facility] OR;  Service: Open Heart Surgery;  Laterality: N/A;  . MITRAL VALVE REPAIR Right 04/16/2017   Procedure: MINIMALLY INVASIVE MITRAL VALVE REPAIR (MVR);  Surgeon: Purcell Nails, MD;  Location: [facility] OR;  Service: Open Heart Surgery;  Laterality: Right;  . MITRAL VALVE REPAIR Right 04/16/2017   Procedure: RE-EXPLORATION RIGHT THORACOTOMY FOR BLEEDING;  Surgeon: Purcell Nails, MD;  Location: [facility] OR;  Service: Open Heart Surgery;  Laterality: Right;  . PERIPHERAL VASCULAR CATHETERIZATION N/A 02/03/2015   Procedure: Upper Extremity Venography;  Surgeon: [name], MD;  Location: [facility] INVASIVE CV LAB;  Service: Cardiovascular;  Laterality: N/A;  . REMOVAL OF PLEURAL DRAINAGE CATHETER Right 09/03/2017   Procedure: REMOVAL OF PLEURAL DRAINAGE CATHETER;  Surgeon: Purcell Nails, MD;  Location: [facility] OR;  Service: Thoracic;  Laterality: Right;  . RIGHT/LEFT HEART CATH AND CORONARY ANGIOGRAPHY N/A 03/26/2017   Procedure: RIGHT/LEFT HEART CATH AND CORONARY ANGIOGRAPHY;  Surgeon: [name], MD;  Location: [facility] INVASIVE CV LAB;  Service: Cardiovascular;  Laterality: N/A;  . TEE WITHOUT CARDIOVERSION N/A 03/18/2017   Procedure: TRANSESOPHAGEAL ECHOCARDIOGRAM (TEE);  Surgeon: [name], MD;  Location: [facility] ENDOSCOPY;  Service: Cardiovascular;  Laterality: N/A;  . TEE WITHOUT CARDIOVERSION N/A 04/16/2017   Procedure: TRANSESOPHAGEAL ECHOCARDIOGRAM (TEE);  Surgeon:  Purcell Nails, MD;  Location: [facility] OR;  Service: Open Heart Surgery;  Laterality: N/A;  . TONSILLECTOMY       Current Meds  Medication Sig  . acetaminophen (TYLENOL) 500 MG tablet Take 500 mg by mouth as needed for moderate pain.   [name] apixaban (ELIQUIS) 2.5 MG TABS tablet Take 1 tablet (2.5 mg total) by mouth 2 (two) times daily.  . calcitRIOL (ROCALTROL) 0.25 MCG capsule Take 0.25 mcg by mouth 2 (two) times a week. In the morning  . escitalopram (LEXAPRO) 10 MG tablet Take 1 tablet (10 mg total) by mouth daily.  [name] levothyroxine (SYNTHROID, LEVOTHROID) 75 MCG tablet Take 75 mcg by mouth daily.  . megestrol (MEGACE) 40 MG tablet Take 1 tablet (40 mg total) by mouth daily.  . potassium chloride SA (KLOR-CON) 20 MEQ tablet Take 40 mEq by mouth daily.     Allergies:   Pepcid [famotidine], Allopurinol, Amlodipine, Atorvastatin, Contrast media [iodinated diagnostic agents], Cephalexin, Ciprofloxacin, Colchicine, Erythromycin, Prilosec [omeprazole], Sulfonamide derivatives, and Tetracycline   Social History   Tobacco Use  . Smoking status: Former Games developer  . Smokeless tobacco: Never Used  . Tobacco comment: quit 1965  Vaping Use  . Vaping Use:  Never used  Substance Use Topics  . Alcohol use: No  . Drug use: No     Family Hx: The patient's family history includes Brain cancer in her brother; Heart attack in her brother and father; Heart disease in her brother, father, and mother; Hypertension in her brother, mother, and sister; Melanoma in her brother; Ovarian cancer in her sister; Rheum arthritis in her sister; Stroke in her mother. There is no history of Colon cancer.  ROS:   Please see the history of present illness.    labs All other systems reviewed and are negative.   Prior CV studies:   The following studies were reviewed today:  2D echo 12/2018 IMPRESSIONS   1. Moderate akinesis of the left ventricular, mid-apical anteroseptal  wall and apical segment.  2. The left  ventricle has mildly reduced systolic function, with an  ejection fraction of 45-50%. The cavity size was normal. Left ventricular  diastolic Doppler parameters are indeterminate.  3. The right ventricle has normal systolic function. The cavity was  normal. There is no increase in right ventricular wall thickness.  4. Mitral valve repair noted with annuloplasty ring.  5. The aortic valve is tricuspid.  6. The inferior vena cava was normal in size with <50% respiratory  variability.  7. No intracardiac thrombi or masses were visualized.   Labs/Other Tests and Data Reviewed:    EKG:  No ECG reviewed.  Recent Labs: 03/18/2020: TSH 0.871 04/24/2020: ALT 13; BUN 39; Creatinine, Ser 1.20; Hemoglobin 13.3; Platelets 227; Potassium 4.2; Sodium 143   Recent Lipid Panel Lab Results  Component Value Date/Time   CHOL 209 (H) 12/27/2018 01:45 AM   TRIG 89 12/27/2018 01:45 AM   HDL 57 12/27/2018 01:45 AM   CHOLHDL 3.7 12/27/2018 01:45 AM   LDLCALC 134 (H) 12/27/2018 01:45 AM    Wt Readings from Last 3 Encounters:  09/07/20 110 lb (49.9 kg)  06/15/20 112 lb 3.2 oz (50.9 kg)  04/21/20 110 lb 12.8 oz (50.3 kg)       Objective:    Vital Signs:  Pulse 94   Ht 5\' 6"  (1.676 m)   Wt 110 lb (49.9 kg)   BMI 17.75 kg/m    VITAL SIGNS:  reviewed GEN:  no acute distress EYES:  sclerae anicteric, EOMI - Extraocular Movements Intact RESPIRATORY:  normal respiratory effort, symmetric expansion CARDIOVASCULAR:  no peripheral edema SKIN:  no rash, lesions or ulcers. MUSCULOSKELETAL:  no obvious deformities. NEURO:  alert and oriented x 3, no obvious focal deficit PSYCH:  normal affect  ASSESSMENT & PLAN:    1.  PAF -s/p maze procedure at the time of her MV repair -she thinks she is in NSR and denies any palpitations -she had 1 episode of PAF on last device check that lasted < 1 hour -denies any bleeding problems on DOAC -continue Apixaban 2.5mg  BID (wt<60kg and age>80) -SCr 1.15 and  Hbg 13.3 in Fall of 2021  2.  HTN -diet controlled on no antihypertensive meds  3.  Mitral Regurgitation -s/p MV repair with annuloplasty ring -stable repair with trivial MR by echo 12/2018  4.  Stage 3a CKD -followed by nephrology  5.  DCM -normal coronary arteries at time of cath for MR in 03/2017 -EF 45-50% with mid-apical AS wll and apical AK on echo 12/2018 -repeat echo to make sure LVF remains stable  6.  Mobitz type 2 AVB -s/p dual chamber PPM -followed in device clinic   COVID-19 Education: The  signs and symptoms of COVID-19 were discussed with the patient and how to seek care for testing (follow up with PCP or arrange E-visit).  The importance of social distancing was discussed today.  Time:   Today, I have spent 20 minutes with the patient with telehealth technology discussing the above problems.     Medication Adjustments/Labs and Tests Ordered: Current medicines are reviewed at length with the patient today.  Concerns regarding medicines are outlined above.   Tests Ordered: No orders of the defined types were placed in this encounter.   Medication Changes: No orders of the defined types were placed in this encounter.   Follow Up:  In Person in 1 year(s)  Signed, Armanda Magic, MD  09/07/2020 2:12 PM    [facility]

## 2020-09-08 DIAGNOSIS — R262 Difficulty in walking, not elsewhere classified: Secondary | ICD-10-CM | POA: Diagnosis not present

## 2020-09-08 DIAGNOSIS — M6281 Muscle weakness (generalized): Secondary | ICD-10-CM | POA: Diagnosis not present

## 2020-09-09 DIAGNOSIS — R262 Difficulty in walking, not elsewhere classified: Secondary | ICD-10-CM | POA: Diagnosis not present

## 2020-09-09 DIAGNOSIS — M6281 Muscle weakness (generalized): Secondary | ICD-10-CM | POA: Diagnosis not present

## 2020-09-09 DIAGNOSIS — D72829 Elevated white blood cell count, unspecified: Secondary | ICD-10-CM | POA: Diagnosis not present

## 2020-09-12 DIAGNOSIS — R262 Difficulty in walking, not elsewhere classified: Secondary | ICD-10-CM | POA: Diagnosis not present

## 2020-09-12 DIAGNOSIS — M6281 Muscle weakness (generalized): Secondary | ICD-10-CM | POA: Diagnosis not present

## 2020-09-13 DIAGNOSIS — R262 Difficulty in walking, not elsewhere classified: Secondary | ICD-10-CM | POA: Diagnosis not present

## 2020-09-13 DIAGNOSIS — M6281 Muscle weakness (generalized): Secondary | ICD-10-CM | POA: Diagnosis not present

## 2020-09-14 DIAGNOSIS — M6281 Muscle weakness (generalized): Secondary | ICD-10-CM | POA: Diagnosis not present

## 2020-09-14 DIAGNOSIS — R262 Difficulty in walking, not elsewhere classified: Secondary | ICD-10-CM | POA: Diagnosis not present

## 2020-09-15 DIAGNOSIS — R262 Difficulty in walking, not elsewhere classified: Secondary | ICD-10-CM | POA: Diagnosis not present

## 2020-09-15 DIAGNOSIS — M6281 Muscle weakness (generalized): Secondary | ICD-10-CM | POA: Diagnosis not present

## 2020-09-19 DIAGNOSIS — M6281 Muscle weakness (generalized): Secondary | ICD-10-CM | POA: Diagnosis not present

## 2020-09-19 DIAGNOSIS — R262 Difficulty in walking, not elsewhere classified: Secondary | ICD-10-CM | POA: Diagnosis not present

## 2020-09-20 DIAGNOSIS — R262 Difficulty in walking, not elsewhere classified: Secondary | ICD-10-CM | POA: Diagnosis not present

## 2020-09-20 DIAGNOSIS — M6281 Muscle weakness (generalized): Secondary | ICD-10-CM | POA: Diagnosis not present

## 2020-09-21 DIAGNOSIS — D72829 Elevated white blood cell count, unspecified: Secondary | ICD-10-CM | POA: Diagnosis not present

## 2020-09-21 DIAGNOSIS — R262 Difficulty in walking, not elsewhere classified: Secondary | ICD-10-CM | POA: Diagnosis not present

## 2020-09-21 DIAGNOSIS — M6281 Muscle weakness (generalized): Secondary | ICD-10-CM | POA: Diagnosis not present

## 2020-09-21 DIAGNOSIS — W19XXXA Unspecified fall, initial encounter: Secondary | ICD-10-CM | POA: Diagnosis not present

## 2020-09-21 DIAGNOSIS — R627 Adult failure to thrive: Secondary | ICD-10-CM | POA: Diagnosis not present

## 2020-09-22 DIAGNOSIS — M6281 Muscle weakness (generalized): Secondary | ICD-10-CM | POA: Diagnosis not present

## 2020-09-22 DIAGNOSIS — R262 Difficulty in walking, not elsewhere classified: Secondary | ICD-10-CM | POA: Diagnosis not present

## 2020-09-23 DIAGNOSIS — R262 Difficulty in walking, not elsewhere classified: Secondary | ICD-10-CM | POA: Diagnosis not present

## 2020-09-23 DIAGNOSIS — M6281 Muscle weakness (generalized): Secondary | ICD-10-CM | POA: Diagnosis not present

## 2020-10-03 ENCOUNTER — Telehealth (HOSPITAL_COMMUNITY): Payer: Self-pay | Admitting: Cardiology

## 2020-10-03 NOTE — Telephone Encounter (Signed)
Patients daughter called and cancelled echocardiogram for reason below:  10/03/2020 2:22 PM  By:   Virl Cagey L   Cancel Rsn: Patient (patient is with hospic)   Order will be removed from the WQ.

## 2020-10-04 ENCOUNTER — Other Ambulatory Visit (HOSPITAL_COMMUNITY): Payer: Medicare Other

## 2020-10-05 DIAGNOSIS — R1319 Other dysphagia: Secondary | ICD-10-CM | POA: Diagnosis not present

## 2020-10-05 DIAGNOSIS — Z515 Encounter for palliative care: Secondary | ICD-10-CM | POA: Diagnosis not present

## 2020-10-05 DIAGNOSIS — J309 Allergic rhinitis, unspecified: Secondary | ICD-10-CM | POA: Diagnosis not present

## 2020-10-05 DIAGNOSIS — I4891 Unspecified atrial fibrillation: Secondary | ICD-10-CM | POA: Diagnosis not present

## 2020-10-05 DIAGNOSIS — S41112A Laceration without foreign body of left upper arm, initial encounter: Secondary | ICD-10-CM | POA: Diagnosis not present

## 2020-10-12 DIAGNOSIS — R2681 Unsteadiness on feet: Secondary | ICD-10-CM | POA: Diagnosis not present

## 2020-10-12 DIAGNOSIS — R296 Repeated falls: Secondary | ICD-10-CM | POA: Diagnosis not present

## 2020-10-12 DIAGNOSIS — S51812A Laceration without foreign body of left forearm, initial encounter: Secondary | ICD-10-CM | POA: Diagnosis not present

## 2020-10-12 DIAGNOSIS — E039 Hypothyroidism, unspecified: Secondary | ICD-10-CM | POA: Diagnosis not present

## 2020-10-12 DIAGNOSIS — Z515 Encounter for palliative care: Secondary | ICD-10-CM | POA: Diagnosis not present

## 2020-10-19 DIAGNOSIS — I4891 Unspecified atrial fibrillation: Secondary | ICD-10-CM | POA: Diagnosis not present

## 2020-10-19 DIAGNOSIS — Z515 Encounter for palliative care: Secondary | ICD-10-CM | POA: Diagnosis not present

## 2020-10-19 DIAGNOSIS — R296 Repeated falls: Secondary | ICD-10-CM | POA: Diagnosis not present

## 2020-10-19 DIAGNOSIS — R634 Abnormal weight loss: Secondary | ICD-10-CM | POA: Diagnosis not present

## 2020-10-19 DIAGNOSIS — R2681 Unsteadiness on feet: Secondary | ICD-10-CM | POA: Diagnosis not present

## 2020-10-25 DIAGNOSIS — Z79899 Other long term (current) drug therapy: Secondary | ICD-10-CM | POA: Diagnosis not present

## 2020-10-25 DIAGNOSIS — N390 Urinary tract infection, site not specified: Secondary | ICD-10-CM | POA: Diagnosis not present

## 2020-10-31 ENCOUNTER — Telehealth: Payer: Self-pay

## 2020-10-31 NOTE — Telephone Encounter (Signed)
Patient's daughter, [name], left a voicemail this morning asking to cancel her mother's appt with Dr. Anne Hahn this week. She would like a call to discuss confirmation of this.

## 2020-11-02 DIAGNOSIS — N390 Urinary tract infection, site not specified: Secondary | ICD-10-CM | POA: Diagnosis not present

## 2020-11-02 DIAGNOSIS — I129 Hypertensive chronic kidney disease with stage 1 through stage 4 chronic kidney disease, or unspecified chronic kidney disease: Secondary | ICD-10-CM | POA: Diagnosis not present

## 2020-11-02 DIAGNOSIS — N1831 Chronic kidney disease, stage 3a: Secondary | ICD-10-CM | POA: Diagnosis not present

## 2020-11-02 DIAGNOSIS — Z515 Encounter for palliative care: Secondary | ICD-10-CM | POA: Diagnosis not present

## 2020-11-03 ENCOUNTER — Ambulatory Visit: Payer: Medicare Other | Admitting: Neurology

## 2020-11-03 DIAGNOSIS — N1831 Chronic kidney disease, stage 3a: Secondary | ICD-10-CM | POA: Diagnosis not present

## 2020-11-03 DIAGNOSIS — N390 Urinary tract infection, site not specified: Secondary | ICD-10-CM | POA: Diagnosis not present

## 2020-11-03 DIAGNOSIS — I129 Hypertensive chronic kidney disease with stage 1 through stage 4 chronic kidney disease, or unspecified chronic kidney disease: Secondary | ICD-10-CM | POA: Diagnosis not present

## 2020-11-14 ENCOUNTER — Ambulatory Visit (INDEPENDENT_AMBULATORY_CARE_PROVIDER_SITE_OTHER)

## 2020-11-14 DIAGNOSIS — I4430 Unspecified atrioventricular block: Secondary | ICD-10-CM

## 2020-11-14 LAB — CUP PACEART REMOTE DEVICE CHECK
Battery Remaining Longevity: 113 mo
Battery Remaining Percentage: 95.5 %
Battery Voltage: 2.98 V
Brady Statistic AP VP Percent: 3.4 %
Brady Statistic AP VS Percent: 1 %
Brady Statistic AS VP Percent: 96 %
Brady Statistic AS VS Percent: 1 %
Brady Statistic RA Percent Paced: 3.2 %
Brady Statistic RV Percent Paced: 99 %
Date Time Interrogation Session: 20220509020021
Implantable Lead Implant Date: 20160701
Implantable Lead Implant Date: 20160701
Implantable Lead Location: 753859
Implantable Lead Location: 753860
Implantable Pulse Generator Implant Date: 20160701
Lead Channel Impedance Value: 380 Ohm
Lead Channel Impedance Value: 460 Ohm
Lead Channel Pacing Threshold Amplitude: 0.75 V
Lead Channel Pacing Threshold Amplitude: 1.125 V
Lead Channel Pacing Threshold Pulse Width: 0.4 ms
Lead Channel Pacing Threshold Pulse Width: 0.4 ms
Lead Channel Sensing Intrinsic Amplitude: 12 mV
Lead Channel Sensing Intrinsic Amplitude: 3.9 mV
Lead Channel Setting Pacing Amplitude: 1.375
Lead Channel Setting Pacing Amplitude: 2 V
Lead Channel Setting Pacing Pulse Width: 0.4 ms
Lead Channel Setting Sensing Sensitivity: 2 mV
Pulse Gen Model: 2240
Pulse Gen Serial Number: 7785935

## 2020-12-02 NOTE — Progress Notes (Signed)
Remote pacemaker transmission.   

## 2020-12-12 DIAGNOSIS — R488 Other symbolic dysfunctions: Secondary | ICD-10-CM | POA: Diagnosis not present

## 2020-12-12 DIAGNOSIS — R1312 Dysphagia, oropharyngeal phase: Secondary | ICD-10-CM | POA: Diagnosis not present

## 2020-12-15 DIAGNOSIS — R1312 Dysphagia, oropharyngeal phase: Secondary | ICD-10-CM | POA: Diagnosis not present

## 2020-12-15 DIAGNOSIS — R488 Other symbolic dysfunctions: Secondary | ICD-10-CM | POA: Diagnosis not present

## 2020-12-16 DIAGNOSIS — R1312 Dysphagia, oropharyngeal phase: Secondary | ICD-10-CM | POA: Diagnosis not present

## 2020-12-16 DIAGNOSIS — R488 Other symbolic dysfunctions: Secondary | ICD-10-CM | POA: Diagnosis not present

## 2021-03-09 DIAGNOSIS — Z8616 Personal history of COVID-19: Secondary | ICD-10-CM | POA: Diagnosis not present

## 2021-03-23 DIAGNOSIS — Z8616 Personal history of COVID-19: Secondary | ICD-10-CM | POA: Diagnosis not present

## 2021-03-27 ENCOUNTER — Ambulatory Visit (INDEPENDENT_AMBULATORY_CARE_PROVIDER_SITE_OTHER)

## 2021-03-27 DIAGNOSIS — I4430 Unspecified atrioventricular block: Secondary | ICD-10-CM

## 2021-03-27 LAB — CUP PACEART REMOTE DEVICE CHECK
Battery Remaining Longevity: 42 mo
Battery Remaining Percentage: 37 %
Battery Voltage: 2.96 V
Brady Statistic AP VP Percent: 8.2 %
Brady Statistic AP VS Percent: 1 %
Brady Statistic AS VP Percent: 91 %
Brady Statistic AS VS Percent: 1 %
Brady Statistic RA Percent Paced: 7.8 %
Brady Statistic RV Percent Paced: 99 %
Date Time Interrogation Session: 20220917143051
Implantable Lead Implant Date: 20160701
Implantable Lead Implant Date: 20160701
Implantable Lead Location: 753859
Implantable Lead Location: 753860
Implantable Pulse Generator Implant Date: 20160701
Lead Channel Impedance Value: 390 Ohm
Lead Channel Impedance Value: 480 Ohm
Lead Channel Pacing Threshold Amplitude: 0.75 V
Lead Channel Pacing Threshold Amplitude: 0.875 V
Lead Channel Pacing Threshold Pulse Width: 0.4 ms
Lead Channel Pacing Threshold Pulse Width: 0.4 ms
Lead Channel Sensing Intrinsic Amplitude: 12 mV
Lead Channel Sensing Intrinsic Amplitude: 5 mV
Lead Channel Setting Pacing Amplitude: 1.125
Lead Channel Setting Pacing Amplitude: 2 V
Lead Channel Setting Pacing Pulse Width: 0.4 ms
Lead Channel Setting Sensing Sensitivity: 2 mV
Pulse Gen Model: 2240
Pulse Gen Serial Number: 7785935

## 2021-03-30 DIAGNOSIS — Z8616 Personal history of COVID-19: Secondary | ICD-10-CM | POA: Diagnosis not present

## 2021-03-31 NOTE — Progress Notes (Signed)
Remote pacemaker transmission.   

## 2021-04-06 DIAGNOSIS — Z8616 Personal history of COVID-19: Secondary | ICD-10-CM | POA: Diagnosis not present

## 2021-04-07 ENCOUNTER — Telehealth: Payer: Self-pay | Admitting: Cardiology

## 2021-04-07 NOTE — Telephone Encounter (Signed)
Patient's daughter called to say that the patient is now in hospice so there is no appt that's need. Please advise

## 2021-04-11 DIAGNOSIS — Z8616 Personal history of COVID-19: Secondary | ICD-10-CM | POA: Diagnosis not present

## 2021-04-13 DIAGNOSIS — Z8616 Personal history of COVID-19: Secondary | ICD-10-CM | POA: Diagnosis not present

## 2021-07-06 DIAGNOSIS — Z20822 Contact with and (suspected) exposure to covid-19: Secondary | ICD-10-CM | POA: Diagnosis not present

## 2021-07-11 DIAGNOSIS — Z20822 Contact with and (suspected) exposure to covid-19: Secondary | ICD-10-CM | POA: Diagnosis not present

## 2021-07-13 DIAGNOSIS — Z20822 Contact with and (suspected) exposure to covid-19: Secondary | ICD-10-CM | POA: Diagnosis not present

## 2021-07-18 DIAGNOSIS — Z20822 Contact with and (suspected) exposure to covid-19: Secondary | ICD-10-CM | POA: Diagnosis not present

## 2021-07-20 DIAGNOSIS — Z20822 Contact with and (suspected) exposure to covid-19: Secondary | ICD-10-CM | POA: Diagnosis not present

## 2021-07-25 DIAGNOSIS — Z20822 Contact with and (suspected) exposure to covid-19: Secondary | ICD-10-CM | POA: Diagnosis not present

## 2021-07-27 DIAGNOSIS — Z20822 Contact with and (suspected) exposure to covid-19: Secondary | ICD-10-CM | POA: Diagnosis not present

## 2021-08-01 DIAGNOSIS — Z20822 Contact with and (suspected) exposure to covid-19: Secondary | ICD-10-CM | POA: Diagnosis not present

## 2021-09-25 ENCOUNTER — Ambulatory Visit (INDEPENDENT_AMBULATORY_CARE_PROVIDER_SITE_OTHER)

## 2021-09-25 DIAGNOSIS — I428 Other cardiomyopathies: Secondary | ICD-10-CM

## 2021-09-26 ENCOUNTER — Telehealth: Payer: Self-pay | Admitting: Internal Medicine

## 2021-09-26 LAB — CUP PACEART REMOTE DEVICE CHECK
Battery Remaining Longevity: 35 mo
Battery Remaining Percentage: 32 %
Battery Voltage: 2.96 V
Brady Statistic AP VP Percent: 14 %
Brady Statistic AP VS Percent: 1 %
Brady Statistic AS VP Percent: 85 %
Brady Statistic AS VS Percent: 1 %
Brady Statistic RA Percent Paced: 13 %
Brady Statistic RV Percent Paced: 99 %
Date Time Interrogation Session: 20230321131809
Implantable Lead Implant Date: 20160701
Implantable Lead Implant Date: 20160701
Implantable Lead Location: 753859
Implantable Lead Location: 753860
Implantable Pulse Generator Implant Date: 20160701
Lead Channel Impedance Value: 410 Ohm
Lead Channel Impedance Value: 480 Ohm
Lead Channel Pacing Threshold Amplitude: 0.75 V
Lead Channel Pacing Threshold Amplitude: 1 V
Lead Channel Pacing Threshold Pulse Width: 0.4 ms
Lead Channel Pacing Threshold Pulse Width: 0.4 ms
Lead Channel Sensing Intrinsic Amplitude: 4.3 mV
Lead Channel Sensing Intrinsic Amplitude: 6.5 mV
Lead Channel Setting Pacing Amplitude: 1.25 V
Lead Channel Setting Pacing Amplitude: 2 V
Lead Channel Setting Pacing Pulse Width: 0.4 ms
Lead Channel Setting Sensing Sensitivity: 2 mV
Pulse Gen Model: 2240
Pulse Gen Serial Number: 7785935

## 2021-09-26 NOTE — Telephone Encounter (Signed)
Spoke to [employer] intake at [facility] and advised we do not turn pacemakers off and plug remote monitor in beside patients bed. We will have a remote completed every 91 days (one came though today). Appreciative of call.

## 2021-09-26 NOTE — Telephone Encounter (Signed)
Would like to speak to someone about patient implantable device.  ?

## 2021-10-04 NOTE — Progress Notes (Signed)
Remote pacemaker transmission.   

## 2021-12-25 ENCOUNTER — Ambulatory Visit (INDEPENDENT_AMBULATORY_CARE_PROVIDER_SITE_OTHER): Payer: Medicare Other

## 2021-12-25 DIAGNOSIS — I428 Other cardiomyopathies: Secondary | ICD-10-CM | POA: Diagnosis not present

## 2021-12-26 LAB — CUP PACEART REMOTE DEVICE CHECK
Battery Remaining Longevity: 32 mo
Battery Remaining Percentage: 29 %
Battery Voltage: 2.95 V
Brady Statistic AP VP Percent: 18 %
Brady Statistic AP VS Percent: 1 %
Brady Statistic AS VP Percent: 81 %
Brady Statistic AS VS Percent: 1 %
Brady Statistic RA Percent Paced: 17 %
Brady Statistic RV Percent Paced: 99 %
Date Time Interrogation Session: 20230619020014
Implantable Lead Implant Date: 20160701
Implantable Lead Implant Date: 20160701
Implantable Lead Location: 753859
Implantable Lead Location: 753860
Implantable Pulse Generator Implant Date: 20160701
Lead Channel Impedance Value: 410 Ohm
Lead Channel Impedance Value: 450 Ohm
Lead Channel Pacing Threshold Amplitude: 0.75 V
Lead Channel Pacing Threshold Amplitude: 0.875 V
Lead Channel Pacing Threshold Pulse Width: 0.4 ms
Lead Channel Pacing Threshold Pulse Width: 0.4 ms
Lead Channel Sensing Intrinsic Amplitude: 12 mV
Lead Channel Sensing Intrinsic Amplitude: 5 mV
Lead Channel Setting Pacing Amplitude: 1.125
Lead Channel Setting Pacing Amplitude: 2 V
Lead Channel Setting Pacing Pulse Width: 0.4 ms
Lead Channel Setting Sensing Sensitivity: 2 mV
Pulse Gen Model: 2240
Pulse Gen Serial Number: 7785935

## 2022-01-12 NOTE — Progress Notes (Signed)
Remote pacemaker transmission.   

## 2022-03-26 ENCOUNTER — Ambulatory Visit

## 2022-03-26 DIAGNOSIS — I428 Other cardiomyopathies: Secondary | ICD-10-CM | POA: Diagnosis not present

## 2022-03-26 DIAGNOSIS — I480 Paroxysmal atrial fibrillation: Secondary | ICD-10-CM

## 2022-03-27 LAB — CUP PACEART REMOTE DEVICE CHECK
Battery Remaining Longevity: 29 mo
Battery Remaining Percentage: 26 %
Battery Voltage: 2.93 V
Brady Statistic AP VP Percent: 19 %
Brady Statistic AP VS Percent: 1 %
Brady Statistic AS VP Percent: 80 %
Brady Statistic AS VS Percent: 1 %
Brady Statistic RA Percent Paced: 19 %
Brady Statistic RV Percent Paced: 99 %
Date Time Interrogation Session: 20230918025203
Implantable Lead Implant Date: 20160701
Implantable Lead Implant Date: 20160701
Implantable Lead Location: 753859
Implantable Lead Location: 753860
Implantable Pulse Generator Implant Date: 20160701
Lead Channel Impedance Value: 400 Ohm
Lead Channel Impedance Value: 440 Ohm
Lead Channel Pacing Threshold Amplitude: 0.75 V
Lead Channel Pacing Threshold Amplitude: 1 V
Lead Channel Pacing Threshold Pulse Width: 0.4 ms
Lead Channel Pacing Threshold Pulse Width: 0.4 ms
Lead Channel Sensing Intrinsic Amplitude: 12 mV
Lead Channel Sensing Intrinsic Amplitude: 5 mV
Lead Channel Setting Pacing Amplitude: 1.25 V
Lead Channel Setting Pacing Amplitude: 2 V
Lead Channel Setting Pacing Pulse Width: 0.4 ms
Lead Channel Setting Sensing Sensitivity: 2 mV
Pulse Gen Model: 2240
Pulse Gen Serial Number: 7785935

## 2022-04-07 NOTE — Progress Notes (Signed)
Remote pacemaker transmission.   

## 2022-06-25 ENCOUNTER — Ambulatory Visit (INDEPENDENT_AMBULATORY_CARE_PROVIDER_SITE_OTHER)

## 2022-06-25 DIAGNOSIS — I428 Other cardiomyopathies: Secondary | ICD-10-CM

## 2022-07-03 LAB — CUP PACEART REMOTE DEVICE CHECK
Battery Remaining Longevity: 25 mo
Battery Remaining Percentage: 24 %
Battery Voltage: 2.93 V
Brady Statistic AP VP Percent: 20 %
Brady Statistic AP VS Percent: 1 %
Brady Statistic AS VP Percent: 78 %
Brady Statistic AS VS Percent: 1 %
Brady Statistic RA Percent Paced: 19 %
Brady Statistic RV Percent Paced: 99 %
Date Time Interrogation Session: 20231224182313
Implantable Lead Connection Status: 753985
Implantable Lead Connection Status: 753985
Implantable Lead Implant Date: 20160701
Implantable Lead Implant Date: 20160701
Implantable Lead Location: 753859
Implantable Lead Location: 753860
Implantable Pulse Generator Implant Date: 20160701
Lead Channel Impedance Value: 380 Ohm
Lead Channel Impedance Value: 440 Ohm
Lead Channel Pacing Threshold Amplitude: 0.75 V
Lead Channel Pacing Threshold Amplitude: 1 V
Lead Channel Pacing Threshold Pulse Width: 0.4 ms
Lead Channel Pacing Threshold Pulse Width: 0.4 ms
Lead Channel Sensing Intrinsic Amplitude: 12 mV
Lead Channel Sensing Intrinsic Amplitude: 3.9 mV
Lead Channel Setting Pacing Amplitude: 1.25 V
Lead Channel Setting Pacing Amplitude: 2 V
Lead Channel Setting Pacing Pulse Width: 0.4 ms
Lead Channel Setting Sensing Sensitivity: 2 mV
Pulse Gen Model: 2240
Pulse Gen Serial Number: 7785935

## 2022-07-30 NOTE — Progress Notes (Signed)
Remote pacemaker transmission.   

## 2022-08-13 DIAGNOSIS — J302 Other seasonal allergic rhinitis: Secondary | ICD-10-CM | POA: Diagnosis not present

## 2022-08-13 DIAGNOSIS — I10 Essential (primary) hypertension: Secondary | ICD-10-CM | POA: Diagnosis not present

## 2022-08-13 DIAGNOSIS — F01C0 Vascular dementia, severe, without behavioral disturbance, psychotic disturbance, mood disturbance, and anxiety: Secondary | ICD-10-CM | POA: Diagnosis not present

## 2022-08-13 DIAGNOSIS — E038 Other specified hypothyroidism: Secondary | ICD-10-CM | POA: Diagnosis not present

## 2022-08-13 DIAGNOSIS — R627 Adult failure to thrive: Secondary | ICD-10-CM | POA: Diagnosis not present

## 2022-08-13 DIAGNOSIS — I482 Chronic atrial fibrillation, unspecified: Secondary | ICD-10-CM | POA: Diagnosis not present

## 2022-08-16 ENCOUNTER — Encounter (HOSPITAL_COMMUNITY): Payer: Self-pay | Admitting: *Deleted

## 2022-09-24 ENCOUNTER — Ambulatory Visit (INDEPENDENT_AMBULATORY_CARE_PROVIDER_SITE_OTHER)

## 2022-09-24 DIAGNOSIS — I428 Other cardiomyopathies: Secondary | ICD-10-CM

## 2022-09-25 LAB — CUP PACEART REMOTE DEVICE CHECK
Battery Remaining Longevity: 23 mo
Battery Remaining Percentage: 21 %
Battery Voltage: 2.92 V
Brady Statistic AP VP Percent: 21 %
Brady Statistic AP VS Percent: 1 %
Brady Statistic AS VP Percent: 77 %
Brady Statistic AS VS Percent: 1 %
Brady Statistic RA Percent Paced: 20 %
Brady Statistic RV Percent Paced: 98 %
Date Time Interrogation Session: 20240318020014
Implantable Lead Connection Status: 753985
Implantable Lead Connection Status: 753985
Implantable Lead Implant Date: 20160701
Implantable Lead Implant Date: 20160701
Implantable Lead Location: 753859
Implantable Lead Location: 753860
Implantable Pulse Generator Implant Date: 20160701
Lead Channel Impedance Value: 380 Ohm
Lead Channel Impedance Value: 440 Ohm
Lead Channel Pacing Threshold Amplitude: 0.75 V
Lead Channel Pacing Threshold Amplitude: 0.875 V
Lead Channel Pacing Threshold Pulse Width: 0.4 ms
Lead Channel Pacing Threshold Pulse Width: 0.4 ms
Lead Channel Sensing Intrinsic Amplitude: 11.4 mV
Lead Channel Sensing Intrinsic Amplitude: 3.6 mV
Lead Channel Setting Pacing Amplitude: 1.125
Lead Channel Setting Pacing Amplitude: 2 V
Lead Channel Setting Pacing Pulse Width: 0.4 ms
Lead Channel Setting Sensing Sensitivity: 2 mV
Pulse Gen Model: 2240
Pulse Gen Serial Number: 7785935

## 2022-10-08 DIAGNOSIS — I482 Chronic atrial fibrillation, unspecified: Secondary | ICD-10-CM | POA: Diagnosis not present

## 2022-10-08 DIAGNOSIS — R627 Adult failure to thrive: Secondary | ICD-10-CM | POA: Diagnosis not present

## 2022-10-08 DIAGNOSIS — I10 Essential (primary) hypertension: Secondary | ICD-10-CM | POA: Diagnosis not present

## 2022-10-08 DIAGNOSIS — E038 Other specified hypothyroidism: Secondary | ICD-10-CM | POA: Diagnosis not present

## 2022-10-22 ENCOUNTER — Telehealth: Payer: Self-pay

## 2022-10-22 NOTE — Telephone Encounter (Signed)
Following alert received from [employer] received for 7 new AHR and 2 new VHR detections since remote alert 10/19/22.  Longest mode switch lasted 14 seconds.  Known PAF, on Eliquis per EPIC.  Longest VHR lasted 7 min 15 sec, EGMs consistent with VT.    Spoke to patients daughter, states patient is in hospice and on comfort measures. Advised I will forward to Dr. [name] so he is aware and anticipate not further actions. Advised to call if they any have questions or concerns.

## 2022-10-23 NOTE — Telephone Encounter (Signed)
Hospice care noted. No additional rec's. GT

## 2022-11-02 NOTE — Progress Notes (Signed)
Remote pacemaker transmission.   

## 2022-11-07 DEATH — deceased
